# Patient Record
Sex: Female | Born: 1960 | Race: White | Hispanic: No | Marital: Married | Smoking: Former smoker
Health system: Southern US, Community
[De-identification: ages and names within clinical notes are randomized; demographics above are authoritative.]

## PROBLEM LIST (undated history)

## (undated) DIAGNOSIS — Z9889 Other specified postprocedural states: Secondary | ICD-10-CM

## (undated) DIAGNOSIS — N200 Calculus of kidney: Secondary | ICD-10-CM

## (undated) DIAGNOSIS — Z8719 Personal history of other diseases of the digestive system: Secondary | ICD-10-CM

## (undated) DIAGNOSIS — E119 Type 2 diabetes mellitus without complications: Secondary | ICD-10-CM

## (undated) DIAGNOSIS — K259 Gastric ulcer, unspecified as acute or chronic, without hemorrhage or perforation: Secondary | ICD-10-CM

## (undated) DIAGNOSIS — C801 Malignant (primary) neoplasm, unspecified: Secondary | ICD-10-CM

## (undated) DIAGNOSIS — K922 Gastrointestinal hemorrhage, unspecified: Secondary | ICD-10-CM

## (undated) DIAGNOSIS — I8500 Esophageal varices without bleeding: Secondary | ICD-10-CM

## (undated) DIAGNOSIS — K639 Disease of intestine, unspecified: Secondary | ICD-10-CM

## (undated) DIAGNOSIS — G4730 Sleep apnea, unspecified: Secondary | ICD-10-CM

## (undated) DIAGNOSIS — N184 Chronic kidney disease, stage 4 (severe): Secondary | ICD-10-CM

## (undated) DIAGNOSIS — M797 Fibromyalgia: Secondary | ICD-10-CM

## (undated) DIAGNOSIS — I219 Acute myocardial infarction, unspecified: Secondary | ICD-10-CM

## (undated) DIAGNOSIS — I81 Portal vein thrombosis: Secondary | ICD-10-CM

## (undated) DIAGNOSIS — N201 Calculus of ureter: Secondary | ICD-10-CM

## (undated) DIAGNOSIS — R51 Headache: Secondary | ICD-10-CM

## (undated) DIAGNOSIS — Z87442 Personal history of urinary calculi: Secondary | ICD-10-CM

## (undated) DIAGNOSIS — I2510 Atherosclerotic heart disease of native coronary artery without angina pectoris: Secondary | ICD-10-CM

## (undated) DIAGNOSIS — R011 Cardiac murmur, unspecified: Secondary | ICD-10-CM

## (undated) DIAGNOSIS — N289 Disorder of kidney and ureter, unspecified: Secondary | ICD-10-CM

## (undated) DIAGNOSIS — Z8619 Personal history of other infectious and parasitic diseases: Secondary | ICD-10-CM

## (undated) DIAGNOSIS — D649 Anemia, unspecified: Secondary | ICD-10-CM

## (undated) DIAGNOSIS — R7881 Bacteremia: Secondary | ICD-10-CM

## (undated) DIAGNOSIS — K7290 Hepatic failure, unspecified without coma: Secondary | ICD-10-CM

## (undated) DIAGNOSIS — IMO0002 Reserved for concepts with insufficient information to code with codable children: Secondary | ICD-10-CM

## (undated) DIAGNOSIS — F329 Major depressive disorder, single episode, unspecified: Secondary | ICD-10-CM

## (undated) DIAGNOSIS — R112 Nausea with vomiting, unspecified: Secondary | ICD-10-CM

## (undated) DIAGNOSIS — F419 Anxiety disorder, unspecified: Secondary | ICD-10-CM

## (undated) DIAGNOSIS — K7460 Unspecified cirrhosis of liver: Secondary | ICD-10-CM

## (undated) DIAGNOSIS — F32A Depression, unspecified: Secondary | ICD-10-CM

## (undated) DIAGNOSIS — T4145XA Adverse effect of unspecified anesthetic, initial encounter: Secondary | ICD-10-CM

## (undated) HISTORY — PX: COLONOSCOPY: SHX174

## (undated) HISTORY — DX: Chronic kidney disease, stage 4 (severe): N18.4

## (undated) HISTORY — PX: DILATION AND CURETTAGE OF UTERUS: SHX78

## (undated) HISTORY — DX: Type 2 diabetes mellitus without complications: E11.9

## (undated) HISTORY — DX: Personal history of other diseases of the digestive system: Z87.19

## (undated) HISTORY — PX: KNEE ARTHROSCOPY: SUR90

## (undated) HISTORY — DX: Calculus of kidney: N20.0

## (undated) HISTORY — PX: GASTRIC BYPASS: SHX52

## (undated) HISTORY — PX: LITHOTRIPSY: SUR834

## (undated) HISTORY — PX: UPPER GASTROINTESTINAL ENDOSCOPY: SHX188

## (undated) HISTORY — DX: Esophageal varices without bleeding: I85.00

## (undated) HISTORY — PX: INGUINAL HERNIA REPAIR: SUR1180

## (undated) HISTORY — DX: Unspecified cirrhosis of liver: K74.60

---

## 1985-02-07 HISTORY — PX: CHOLECYSTECTOMY: SHX55

## 2000-01-21 ENCOUNTER — Ambulatory Visit (HOSPITAL_COMMUNITY): Admission: RE | Admit: 2000-01-21 | Discharge: 2000-01-21 | Payer: Self-pay | Admitting: Obstetrics and Gynecology

## 2000-01-21 ENCOUNTER — Encounter (INDEPENDENT_AMBULATORY_CARE_PROVIDER_SITE_OTHER): Payer: Self-pay | Admitting: Specialist

## 2000-03-18 ENCOUNTER — Inpatient Hospital Stay (HOSPITAL_COMMUNITY): Admission: EM | Admit: 2000-03-18 | Discharge: 2000-03-20 | Payer: Self-pay | Admitting: Emergency Medicine

## 2000-03-18 ENCOUNTER — Encounter: Payer: Self-pay | Admitting: Cardiology

## 2000-03-19 ENCOUNTER — Encounter: Payer: Self-pay | Admitting: Cardiology

## 2000-04-03 ENCOUNTER — Other Ambulatory Visit: Admission: RE | Admit: 2000-04-03 | Discharge: 2000-04-03 | Payer: Self-pay | Admitting: Obstetrics and Gynecology

## 2000-09-16 ENCOUNTER — Emergency Department (HOSPITAL_COMMUNITY): Admission: EM | Admit: 2000-09-16 | Discharge: 2000-09-16 | Payer: Self-pay | Admitting: Emergency Medicine

## 2000-09-16 ENCOUNTER — Encounter: Payer: Self-pay | Admitting: Emergency Medicine

## 2000-09-18 ENCOUNTER — Ambulatory Visit (HOSPITAL_COMMUNITY): Admission: RE | Admit: 2000-09-18 | Discharge: 2000-09-18 | Payer: Self-pay | Admitting: Internal Medicine

## 2000-09-18 ENCOUNTER — Encounter: Payer: Self-pay | Admitting: Internal Medicine

## 2000-09-22 ENCOUNTER — Ambulatory Visit (HOSPITAL_COMMUNITY): Admission: RE | Admit: 2000-09-22 | Discharge: 2000-09-22 | Payer: Self-pay | Admitting: Family Medicine

## 2000-09-22 ENCOUNTER — Encounter: Payer: Self-pay | Admitting: Family Medicine

## 2000-09-26 ENCOUNTER — Encounter: Payer: Self-pay | Admitting: General Surgery

## 2000-09-26 ENCOUNTER — Ambulatory Visit (HOSPITAL_COMMUNITY): Admission: RE | Admit: 2000-09-26 | Discharge: 2000-09-26 | Payer: Self-pay | Admitting: General Surgery

## 2001-02-07 HISTORY — PX: ABDOMINAL HYSTERECTOMY: SHX81

## 2001-03-15 ENCOUNTER — Inpatient Hospital Stay (HOSPITAL_COMMUNITY): Admission: RE | Admit: 2001-03-15 | Discharge: 2001-03-19 | Payer: Self-pay | Admitting: Obstetrics & Gynecology

## 2001-04-16 ENCOUNTER — Other Ambulatory Visit: Admission: RE | Admit: 2001-04-16 | Discharge: 2001-04-16 | Payer: Self-pay | Admitting: Obstetrics & Gynecology

## 2002-03-12 ENCOUNTER — Encounter: Payer: Self-pay | Admitting: Emergency Medicine

## 2002-03-12 ENCOUNTER — Inpatient Hospital Stay (HOSPITAL_COMMUNITY): Admission: EM | Admit: 2002-03-12 | Discharge: 2002-03-13 | Payer: Self-pay | Admitting: *Deleted

## 2002-03-12 ENCOUNTER — Encounter (INDEPENDENT_AMBULATORY_CARE_PROVIDER_SITE_OTHER): Payer: Self-pay | Admitting: *Deleted

## 2002-08-09 ENCOUNTER — Ambulatory Visit (HOSPITAL_COMMUNITY): Admission: RE | Admit: 2002-08-09 | Discharge: 2002-08-09 | Payer: Self-pay | Admitting: General Surgery

## 2002-08-09 ENCOUNTER — Encounter: Payer: Self-pay | Admitting: General Surgery

## 2002-10-15 ENCOUNTER — Ambulatory Visit (HOSPITAL_COMMUNITY): Admission: RE | Admit: 2002-10-15 | Discharge: 2002-10-15 | Payer: Self-pay | Admitting: Internal Medicine

## 2002-10-15 ENCOUNTER — Encounter: Payer: Self-pay | Admitting: Internal Medicine

## 2002-10-18 ENCOUNTER — Ambulatory Visit (HOSPITAL_COMMUNITY): Admission: RE | Admit: 2002-10-18 | Discharge: 2002-10-18 | Payer: Self-pay | Admitting: Internal Medicine

## 2002-10-18 ENCOUNTER — Encounter: Payer: Self-pay | Admitting: Internal Medicine

## 2003-04-15 ENCOUNTER — Ambulatory Visit (HOSPITAL_COMMUNITY): Admission: RE | Admit: 2003-04-15 | Discharge: 2003-04-15 | Payer: Self-pay | Admitting: Internal Medicine

## 2003-05-15 ENCOUNTER — Ambulatory Visit (HOSPITAL_COMMUNITY): Admission: RE | Admit: 2003-05-15 | Discharge: 2003-05-15 | Payer: Self-pay | Admitting: Internal Medicine

## 2003-08-01 ENCOUNTER — Ambulatory Visit (HOSPITAL_COMMUNITY): Admission: RE | Admit: 2003-08-01 | Discharge: 2003-08-01 | Payer: Self-pay | Admitting: *Deleted

## 2003-08-18 ENCOUNTER — Ambulatory Visit (HOSPITAL_COMMUNITY): Admission: RE | Admit: 2003-08-18 | Discharge: 2003-08-18 | Payer: Self-pay | Admitting: Urology

## 2003-08-28 ENCOUNTER — Ambulatory Visit (HOSPITAL_COMMUNITY): Admission: RE | Admit: 2003-08-28 | Discharge: 2003-08-28 | Payer: Self-pay | Admitting: Internal Medicine

## 2003-09-20 ENCOUNTER — Ambulatory Visit (HOSPITAL_COMMUNITY): Admission: RE | Admit: 2003-09-20 | Discharge: 2003-09-20 | Payer: Self-pay | Admitting: Internal Medicine

## 2003-09-20 ENCOUNTER — Encounter (HOSPITAL_COMMUNITY): Admission: RE | Admit: 2003-09-20 | Discharge: 2003-10-20 | Payer: Self-pay | Admitting: Internal Medicine

## 2003-09-20 ENCOUNTER — Encounter (INDEPENDENT_AMBULATORY_CARE_PROVIDER_SITE_OTHER): Payer: Self-pay | Admitting: Specialist

## 2003-11-21 ENCOUNTER — Ambulatory Visit (HOSPITAL_COMMUNITY): Admission: RE | Admit: 2003-11-21 | Discharge: 2003-11-21 | Payer: Self-pay | Admitting: Internal Medicine

## 2003-12-09 ENCOUNTER — Inpatient Hospital Stay (HOSPITAL_COMMUNITY): Admission: EM | Admit: 2003-12-09 | Discharge: 2003-12-13 | Payer: Self-pay | Admitting: Emergency Medicine

## 2003-12-25 ENCOUNTER — Ambulatory Visit: Payer: Self-pay | Admitting: Gastroenterology

## 2003-12-30 ENCOUNTER — Encounter (HOSPITAL_COMMUNITY): Admission: RE | Admit: 2003-12-30 | Discharge: 2004-01-29 | Payer: Self-pay | Admitting: Internal Medicine

## 2003-12-30 ENCOUNTER — Ambulatory Visit (HOSPITAL_COMMUNITY): Payer: Self-pay | Admitting: Internal Medicine

## 2004-01-02 ENCOUNTER — Ambulatory Visit (HOSPITAL_COMMUNITY): Payer: Self-pay | Admitting: Oncology

## 2004-01-05 ENCOUNTER — Ambulatory Visit (HOSPITAL_COMMUNITY): Admission: RE | Admit: 2004-01-05 | Discharge: 2004-01-05 | Payer: Self-pay | Admitting: Internal Medicine

## 2004-01-29 ENCOUNTER — Ambulatory Visit: Payer: Self-pay | Admitting: Internal Medicine

## 2004-02-23 ENCOUNTER — Ambulatory Visit (HOSPITAL_COMMUNITY): Payer: Self-pay | Admitting: Internal Medicine

## 2004-02-23 ENCOUNTER — Encounter (HOSPITAL_COMMUNITY): Admission: RE | Admit: 2004-02-23 | Discharge: 2004-04-05 | Payer: Self-pay | Admitting: Internal Medicine

## 2004-03-01 ENCOUNTER — Ambulatory Visit: Payer: Self-pay | Admitting: Internal Medicine

## 2004-03-29 ENCOUNTER — Ambulatory Visit: Payer: Self-pay | Admitting: Internal Medicine

## 2004-04-07 ENCOUNTER — Encounter (HOSPITAL_COMMUNITY): Admission: RE | Admit: 2004-04-07 | Discharge: 2004-05-07 | Payer: Self-pay | Admitting: Internal Medicine

## 2004-04-26 ENCOUNTER — Ambulatory Visit: Payer: Self-pay | Admitting: Internal Medicine

## 2004-05-25 ENCOUNTER — Ambulatory Visit: Payer: Self-pay | Admitting: Internal Medicine

## 2004-06-07 ENCOUNTER — Encounter (HOSPITAL_COMMUNITY): Admission: RE | Admit: 2004-06-07 | Discharge: 2004-07-07 | Payer: Self-pay | Admitting: Internal Medicine

## 2004-06-07 ENCOUNTER — Ambulatory Visit: Payer: Self-pay | Admitting: Internal Medicine

## 2004-06-14 ENCOUNTER — Ambulatory Visit (HOSPITAL_COMMUNITY): Payer: Self-pay | Admitting: Internal Medicine

## 2004-07-02 ENCOUNTER — Emergency Department (HOSPITAL_COMMUNITY): Admission: EM | Admit: 2004-07-02 | Discharge: 2004-07-02 | Payer: Self-pay | Admitting: Emergency Medicine

## 2004-07-06 ENCOUNTER — Ambulatory Visit: Payer: Self-pay | Admitting: Internal Medicine

## 2004-07-07 ENCOUNTER — Ambulatory Visit (HOSPITAL_COMMUNITY): Admission: RE | Admit: 2004-07-07 | Discharge: 2004-07-07 | Payer: Self-pay | Admitting: Internal Medicine

## 2004-07-08 ENCOUNTER — Encounter (HOSPITAL_COMMUNITY): Admission: RE | Admit: 2004-07-08 | Discharge: 2004-08-11 | Payer: Self-pay | Admitting: Internal Medicine

## 2004-07-08 ENCOUNTER — Ambulatory Visit (HOSPITAL_COMMUNITY): Admission: RE | Admit: 2004-07-08 | Discharge: 2004-07-08 | Payer: Self-pay | Admitting: Internal Medicine

## 2004-07-14 ENCOUNTER — Ambulatory Visit: Payer: Self-pay | Admitting: Internal Medicine

## 2004-07-19 ENCOUNTER — Ambulatory Visit: Payer: Self-pay | Admitting: Internal Medicine

## 2004-07-28 ENCOUNTER — Ambulatory Visit: Payer: Self-pay | Admitting: Internal Medicine

## 2004-08-02 ENCOUNTER — Ambulatory Visit (HOSPITAL_COMMUNITY): Payer: Self-pay | Admitting: Internal Medicine

## 2004-08-11 ENCOUNTER — Ambulatory Visit: Payer: Self-pay | Admitting: Internal Medicine

## 2004-08-16 ENCOUNTER — Encounter (HOSPITAL_COMMUNITY): Admission: RE | Admit: 2004-08-16 | Discharge: 2004-09-15 | Payer: Self-pay | Admitting: Internal Medicine

## 2004-09-13 ENCOUNTER — Ambulatory Visit: Payer: Self-pay | Admitting: Internal Medicine

## 2004-09-20 ENCOUNTER — Ambulatory Visit (HOSPITAL_COMMUNITY): Payer: Self-pay | Admitting: Internal Medicine

## 2004-09-20 ENCOUNTER — Encounter (HOSPITAL_COMMUNITY): Admission: RE | Admit: 2004-09-20 | Discharge: 2004-10-20 | Payer: Self-pay | Admitting: Internal Medicine

## 2004-10-12 ENCOUNTER — Ambulatory Visit: Payer: Self-pay | Admitting: Internal Medicine

## 2004-10-25 ENCOUNTER — Encounter (HOSPITAL_COMMUNITY): Admission: RE | Admit: 2004-10-25 | Discharge: 2004-11-05 | Payer: Self-pay | Admitting: Oncology

## 2004-11-08 ENCOUNTER — Encounter (HOSPITAL_COMMUNITY): Admission: RE | Admit: 2004-11-08 | Discharge: 2004-12-08 | Payer: Self-pay | Admitting: Oncology

## 2004-11-08 ENCOUNTER — Ambulatory Visit: Payer: Self-pay | Admitting: Internal Medicine

## 2004-11-08 ENCOUNTER — Ambulatory Visit (HOSPITAL_COMMUNITY): Payer: Self-pay | Admitting: Internal Medicine

## 2004-12-09 ENCOUNTER — Ambulatory Visit: Payer: Self-pay | Admitting: Internal Medicine

## 2004-12-13 ENCOUNTER — Encounter (HOSPITAL_COMMUNITY): Admission: RE | Admit: 2004-12-13 | Discharge: 2005-01-12 | Payer: Self-pay | Admitting: Internal Medicine

## 2004-12-29 ENCOUNTER — Ambulatory Visit (HOSPITAL_COMMUNITY): Admission: RE | Admit: 2004-12-29 | Discharge: 2004-12-29 | Payer: Self-pay | Admitting: Internal Medicine

## 2005-01-03 ENCOUNTER — Ambulatory Visit (HOSPITAL_COMMUNITY): Payer: Self-pay | Admitting: Internal Medicine

## 2005-01-04 ENCOUNTER — Ambulatory Visit: Payer: Self-pay | Admitting: Internal Medicine

## 2005-01-17 ENCOUNTER — Encounter (HOSPITAL_COMMUNITY): Admission: RE | Admit: 2005-01-17 | Discharge: 2005-02-02 | Payer: Self-pay | Admitting: Oncology

## 2005-02-09 ENCOUNTER — Encounter (HOSPITAL_COMMUNITY): Admission: RE | Admit: 2005-02-09 | Discharge: 2005-03-11 | Payer: Self-pay | Admitting: Internal Medicine

## 2005-02-22 ENCOUNTER — Emergency Department (HOSPITAL_COMMUNITY): Admission: EM | Admit: 2005-02-22 | Discharge: 2005-02-22 | Payer: Self-pay | Admitting: Emergency Medicine

## 2005-02-24 ENCOUNTER — Ambulatory Visit (HOSPITAL_COMMUNITY): Admission: RE | Admit: 2005-02-24 | Discharge: 2005-02-24 | Payer: Self-pay | Admitting: Internal Medicine

## 2005-02-24 ENCOUNTER — Ambulatory Visit: Payer: Self-pay | Admitting: *Deleted

## 2005-02-24 ENCOUNTER — Ambulatory Visit: Payer: Self-pay | Admitting: Internal Medicine

## 2005-03-10 ENCOUNTER — Ambulatory Visit: Payer: Self-pay | Admitting: Internal Medicine

## 2005-03-24 ENCOUNTER — Ambulatory Visit: Payer: Self-pay | Admitting: Internal Medicine

## 2005-04-26 ENCOUNTER — Ambulatory Visit: Payer: Self-pay | Admitting: Internal Medicine

## 2005-04-30 ENCOUNTER — Emergency Department (HOSPITAL_COMMUNITY): Admission: EM | Admit: 2005-04-30 | Discharge: 2005-04-30 | Payer: Self-pay | Admitting: Emergency Medicine

## 2005-04-30 ENCOUNTER — Ambulatory Visit: Payer: Self-pay | Admitting: Internal Medicine

## 2005-05-04 ENCOUNTER — Ambulatory Visit (HOSPITAL_COMMUNITY): Payer: Self-pay | Admitting: Internal Medicine

## 2005-05-04 ENCOUNTER — Encounter: Admission: RE | Admit: 2005-05-04 | Discharge: 2005-05-04 | Payer: Self-pay | Admitting: Oncology

## 2005-05-04 ENCOUNTER — Encounter (HOSPITAL_COMMUNITY): Admission: RE | Admit: 2005-05-04 | Discharge: 2005-06-03 | Payer: Self-pay | Admitting: Oncology

## 2005-06-21 ENCOUNTER — Ambulatory Visit: Payer: Self-pay | Admitting: Internal Medicine

## 2005-07-25 ENCOUNTER — Ambulatory Visit (HOSPITAL_COMMUNITY): Admission: RE | Admit: 2005-07-25 | Discharge: 2005-07-25 | Payer: Self-pay | Admitting: Urology

## 2005-08-31 ENCOUNTER — Ambulatory Visit: Payer: Self-pay | Admitting: Internal Medicine

## 2005-09-12 ENCOUNTER — Emergency Department (HOSPITAL_COMMUNITY): Admission: EM | Admit: 2005-09-12 | Discharge: 2005-09-12 | Payer: Self-pay | Admitting: Emergency Medicine

## 2005-09-22 ENCOUNTER — Ambulatory Visit: Payer: Self-pay | Admitting: Internal Medicine

## 2005-09-30 ENCOUNTER — Ambulatory Visit: Payer: Self-pay | Admitting: Internal Medicine

## 2005-10-28 ENCOUNTER — Ambulatory Visit (HOSPITAL_COMMUNITY): Admission: RE | Admit: 2005-10-28 | Discharge: 2005-10-28 | Payer: Self-pay | Admitting: Internal Medicine

## 2005-10-28 ENCOUNTER — Ambulatory Visit: Payer: Self-pay | Admitting: Internal Medicine

## 2005-12-06 ENCOUNTER — Ambulatory Visit (HOSPITAL_COMMUNITY): Admission: RE | Admit: 2005-12-06 | Discharge: 2005-12-06 | Payer: Self-pay | Admitting: Family Medicine

## 2005-12-22 ENCOUNTER — Ambulatory Visit: Payer: Self-pay | Admitting: Internal Medicine

## 2006-02-22 ENCOUNTER — Ambulatory Visit: Payer: Self-pay | Admitting: Internal Medicine

## 2006-03-01 ENCOUNTER — Ambulatory Visit: Payer: Self-pay | Admitting: Cardiology

## 2006-03-03 ENCOUNTER — Ambulatory Visit: Payer: Self-pay | Admitting: Internal Medicine

## 2006-03-08 ENCOUNTER — Ambulatory Visit: Payer: Self-pay | Admitting: Cardiology

## 2006-03-08 ENCOUNTER — Encounter (HOSPITAL_COMMUNITY): Admission: RE | Admit: 2006-03-08 | Discharge: 2006-04-07 | Payer: Self-pay | Admitting: Cardiology

## 2006-04-04 ENCOUNTER — Encounter (INDEPENDENT_AMBULATORY_CARE_PROVIDER_SITE_OTHER): Payer: Self-pay | Admitting: *Deleted

## 2006-04-04 ENCOUNTER — Ambulatory Visit (HOSPITAL_COMMUNITY): Admission: RE | Admit: 2006-04-04 | Discharge: 2006-04-04 | Payer: Self-pay | Admitting: Internal Medicine

## 2006-04-04 ENCOUNTER — Ambulatory Visit: Payer: Self-pay | Admitting: Internal Medicine

## 2006-05-12 ENCOUNTER — Ambulatory Visit: Payer: Self-pay | Admitting: Internal Medicine

## 2006-06-30 ENCOUNTER — Ambulatory Visit: Payer: Self-pay | Admitting: Internal Medicine

## 2006-06-30 ENCOUNTER — Ambulatory Visit (HOSPITAL_COMMUNITY): Admission: RE | Admit: 2006-06-30 | Discharge: 2006-06-30 | Payer: Self-pay | Admitting: Internal Medicine

## 2006-10-28 ENCOUNTER — Emergency Department (HOSPITAL_COMMUNITY): Admission: EM | Admit: 2006-10-28 | Discharge: 2006-10-29 | Payer: Self-pay | Admitting: Emergency Medicine

## 2006-12-10 ENCOUNTER — Emergency Department (HOSPITAL_COMMUNITY): Admission: EM | Admit: 2006-12-10 | Discharge: 2006-12-10 | Payer: Self-pay | Admitting: Emergency Medicine

## 2006-12-18 ENCOUNTER — Ambulatory Visit (HOSPITAL_COMMUNITY): Admission: RE | Admit: 2006-12-18 | Discharge: 2006-12-18 | Payer: Self-pay | Admitting: Urology

## 2007-02-23 ENCOUNTER — Ambulatory Visit (HOSPITAL_COMMUNITY): Admission: RE | Admit: 2007-02-23 | Discharge: 2007-02-23 | Payer: Self-pay | Admitting: Internal Medicine

## 2007-04-25 ENCOUNTER — Ambulatory Visit (HOSPITAL_COMMUNITY): Admission: RE | Admit: 2007-04-25 | Discharge: 2007-04-25 | Payer: Self-pay | Admitting: Urology

## 2007-04-26 ENCOUNTER — Ambulatory Visit (HOSPITAL_COMMUNITY): Admission: RE | Admit: 2007-04-26 | Discharge: 2007-04-26 | Payer: Self-pay | Admitting: Urology

## 2007-04-26 ENCOUNTER — Encounter (INDEPENDENT_AMBULATORY_CARE_PROVIDER_SITE_OTHER): Payer: Self-pay | Admitting: Urology

## 2007-05-03 ENCOUNTER — Ambulatory Visit (HOSPITAL_COMMUNITY): Admission: RE | Admit: 2007-05-03 | Discharge: 2007-05-03 | Payer: Self-pay | Admitting: Urology

## 2007-06-01 ENCOUNTER — Ambulatory Visit (HOSPITAL_COMMUNITY): Admission: RE | Admit: 2007-06-01 | Discharge: 2007-06-01 | Payer: Self-pay | Admitting: Urology

## 2008-03-12 ENCOUNTER — Ambulatory Visit (HOSPITAL_COMMUNITY): Admission: RE | Admit: 2008-03-12 | Discharge: 2008-03-12 | Payer: Self-pay | Admitting: Internal Medicine

## 2008-07-02 ENCOUNTER — Ambulatory Visit (HOSPITAL_COMMUNITY): Admission: RE | Admit: 2008-07-02 | Discharge: 2008-07-02 | Payer: Self-pay | Admitting: Family Medicine

## 2008-07-03 ENCOUNTER — Ambulatory Visit (HOSPITAL_COMMUNITY): Admission: RE | Admit: 2008-07-03 | Discharge: 2008-07-03 | Payer: Self-pay | Admitting: Family Medicine

## 2008-10-14 ENCOUNTER — Ambulatory Visit (HOSPITAL_COMMUNITY): Admission: RE | Admit: 2008-10-14 | Discharge: 2008-10-14 | Payer: Self-pay | Admitting: Internal Medicine

## 2008-10-28 ENCOUNTER — Encounter: Admission: RE | Admit: 2008-10-28 | Discharge: 2008-10-28 | Payer: Self-pay | Admitting: Internal Medicine

## 2009-05-06 ENCOUNTER — Encounter: Admission: RE | Admit: 2009-05-06 | Discharge: 2009-05-06 | Payer: Self-pay | Admitting: Internal Medicine

## 2009-07-08 ENCOUNTER — Ambulatory Visit (HOSPITAL_COMMUNITY): Admission: RE | Admit: 2009-07-08 | Discharge: 2009-07-08 | Payer: Self-pay | Admitting: Internal Medicine

## 2009-12-11 ENCOUNTER — Encounter: Admission: RE | Admit: 2009-12-11 | Discharge: 2009-12-11 | Payer: Self-pay | Admitting: Internal Medicine

## 2009-12-19 ENCOUNTER — Inpatient Hospital Stay (HOSPITAL_COMMUNITY): Admission: EM | Admit: 2009-12-19 | Discharge: 2009-12-24 | Payer: Self-pay | Admitting: Emergency Medicine

## 2010-02-28 ENCOUNTER — Encounter (INDEPENDENT_AMBULATORY_CARE_PROVIDER_SITE_OTHER): Payer: Self-pay | Admitting: Internal Medicine

## 2010-02-28 ENCOUNTER — Encounter: Payer: Self-pay | Admitting: Urology

## 2010-03-01 ENCOUNTER — Encounter (INDEPENDENT_AMBULATORY_CARE_PROVIDER_SITE_OTHER): Payer: Self-pay | Admitting: Internal Medicine

## 2010-04-20 LAB — HEMOGLOBIN A1C
Mean Plasma Glucose: 134 mg/dL — ABNORMAL HIGH (ref <117)
Mean Plasma Glucose: 134 mg/dL — ABNORMAL HIGH (ref <117)

## 2010-04-20 LAB — CBC
HCT: 28.5 % — ABNORMAL LOW (ref 36.0–46.0)
HCT: 34.5 % — ABNORMAL LOW (ref 36.0–46.0)
Hemoglobin: 10.2 g/dL — ABNORMAL LOW (ref 12.0–15.0)
Hemoglobin: 11.6 g/dL — ABNORMAL LOW (ref 12.0–15.0)
Hemoglobin: 9.9 g/dL — ABNORMAL LOW (ref 12.0–15.0)
MCH: 29.6 pg (ref 26.0–34.0)
MCH: 29.7 pg (ref 26.0–34.0)
MCH: 30 pg (ref 26.0–34.0)
MCH: 30.1 pg (ref 26.0–34.0)
MCHC: 33.7 g/dL (ref 30.0–36.0)
MCHC: 33.8 g/dL (ref 30.0–36.0)
MCHC: 34.4 g/dL (ref 30.0–36.0)
MCHC: 34.4 g/dL (ref 30.0–36.0)
MCHC: 34.6 g/dL (ref 30.0–36.0)
MCV: 87.7 fL (ref 78.0–100.0)
Platelets: 38 10*3/uL — ABNORMAL LOW (ref 150–400)
Platelets: 39 10*3/uL — ABNORMAL LOW (ref 150–400)
Platelets: 47 10*3/uL — ABNORMAL LOW (ref 150–400)
RBC: 3.3 MIL/uL — ABNORMAL LOW (ref 3.87–5.11)
RBC: 3.45 MIL/uL — ABNORMAL LOW (ref 3.87–5.11)
RDW: 16 % — ABNORMAL HIGH (ref 11.5–15.5)
RDW: 16.1 % — ABNORMAL HIGH (ref 11.5–15.5)
RDW: 16.2 % — ABNORMAL HIGH (ref 11.5–15.5)
RDW: 16.3 % — ABNORMAL HIGH (ref 11.5–15.5)
WBC: 10.2 10*3/uL (ref 4.0–10.5)

## 2010-04-20 LAB — COMPREHENSIVE METABOLIC PANEL
ALT: 35 U/L (ref 0–35)
ALT: 63 U/L — ABNORMAL HIGH (ref 0–35)
ALT: 70 U/L — ABNORMAL HIGH (ref 0–35)
AST: 35 U/L (ref 0–37)
Albumin: 2.5 g/dL — ABNORMAL LOW (ref 3.5–5.2)
Albumin: 2.6 g/dL — ABNORMAL LOW (ref 3.5–5.2)
BUN: 20 mg/dL (ref 6–23)
BUN: 25 mg/dL — ABNORMAL HIGH (ref 6–23)
CO2: 23 mEq/L (ref 19–32)
CO2: 24 mEq/L (ref 19–32)
Calcium: 7.6 mg/dL — ABNORMAL LOW (ref 8.4–10.5)
Calcium: 8 mg/dL — ABNORMAL LOW (ref 8.4–10.5)
Calcium: 8.3 mg/dL — ABNORMAL LOW (ref 8.4–10.5)
Calcium: 8.3 mg/dL — ABNORMAL LOW (ref 8.4–10.5)
Chloride: 103 mEq/L (ref 96–112)
Creatinine, Ser: 0.88 mg/dL (ref 0.4–1.2)
Creatinine, Ser: 1.15 mg/dL (ref 0.4–1.2)
Creatinine, Ser: 1.66 mg/dL — ABNORMAL HIGH (ref 0.4–1.2)
Creatinine, Ser: 2.04 mg/dL — ABNORMAL HIGH (ref 0.4–1.2)
GFR calc Af Amer: >60 mL/min (ref >60)
GFR calc non Af Amer: 33 mL/min — ABNORMAL LOW (ref >60)
GFR calc non Af Amer: 50 mL/min — ABNORMAL LOW (ref >60)
Glucose, Bld: 110 mg/dL — ABNORMAL HIGH (ref 70–99)
Glucose, Bld: 250 mg/dL — ABNORMAL HIGH (ref 70–99)
Potassium: 3.8 mEq/L (ref 3.5–5.1)
Sodium: 129 mEq/L — ABNORMAL LOW (ref 135–145)
Sodium: 131 mEq/L — ABNORMAL LOW (ref 135–145)
Sodium: 138 mEq/L (ref 135–145)
Total Protein: 5.5 g/dL — ABNORMAL LOW (ref 6.0–8.3)
Total Protein: 5.8 g/dL — ABNORMAL LOW (ref 6.0–8.3)
Total Protein: 6.7 g/dL (ref 6.0–8.3)

## 2010-04-20 LAB — GLUCOSE, CAPILLARY
Glucose-Capillary: 131 mg/dL — ABNORMAL HIGH (ref 70–99)
Glucose-Capillary: 169 mg/dL — ABNORMAL HIGH (ref 70–99)
Glucose-Capillary: 170 mg/dL — ABNORMAL HIGH (ref 70–99)
Glucose-Capillary: 215 mg/dL — ABNORMAL HIGH (ref 70–99)
Glucose-Capillary: 251 mg/dL — ABNORMAL HIGH (ref 70–99)
Glucose-Capillary: 263 mg/dL — ABNORMAL HIGH (ref 70–99)
Glucose-Capillary: 308 mg/dL — ABNORMAL HIGH (ref 70–99)
Glucose-Capillary: 361 mg/dL — ABNORMAL HIGH (ref 70–99)

## 2010-04-20 LAB — URINE MICROSCOPIC-ADD ON

## 2010-04-20 LAB — BASIC METABOLIC PANEL
CO2: 25 mEq/L (ref 19–32)
Calcium: 7.8 mg/dL — ABNORMAL LOW (ref 8.4–10.5)
GFR calc Af Amer: >60 mL/min (ref >60)
Sodium: 135 mEq/L (ref 135–145)

## 2010-04-20 LAB — CULTURE, BLOOD (ROUTINE X 2)
Culture: NO GROWTH
Culture: NO GROWTH

## 2010-04-20 LAB — URINALYSIS, ROUTINE W REFLEX MICROSCOPIC
Bilirubin Urine: NEGATIVE
Nitrite: NEGATIVE
Nitrite: NEGATIVE
Specific Gravity, Urine: 1.02 (ref 1.005–1.030)
Specific Gravity, Urine: 1.02 (ref 1.005–1.030)
Urobilinogen, UA: 0.2 mg/dL (ref 0.0–1.0)
pH: 5.5 (ref 5.0–8.0)

## 2010-04-20 LAB — DIFFERENTIAL
Basophils Absolute: 0 10*3/uL (ref 0.0–0.1)
Basophils Relative: 0 % (ref 0–1)
Basophils Relative: 1 % (ref 0–1)
Eosinophils Absolute: 0 10*3/uL (ref 0.0–0.7)
Eosinophils Absolute: 0.1 10*3/uL (ref 0.0–0.7)
Eosinophils Absolute: 0.1 10*3/uL (ref 0.0–0.7)
Eosinophils Absolute: 0.1 10*3/uL (ref 0.0–0.7)
Eosinophils Relative: 2 % (ref 0–5)
Lymphocytes Relative: 13 % (ref 12–46)
Lymphocytes Relative: 3 % — ABNORMAL LOW (ref 12–46)
Lymphs Abs: 0.3 10*3/uL — ABNORMAL LOW (ref 0.7–4.0)
Lymphs Abs: 0.3 10*3/uL — ABNORMAL LOW (ref 0.7–4.0)
Lymphs Abs: 0.5 10*3/uL — ABNORMAL LOW (ref 0.7–4.0)
Lymphs Abs: 0.5 10*3/uL — ABNORMAL LOW (ref 0.7–4.0)
Monocytes Absolute: 0.4 10*3/uL (ref 0.1–1.0)
Monocytes Absolute: 0.7 10*3/uL (ref 0.1–1.0)
Monocytes Absolute: 0.7 10*3/uL (ref 0.1–1.0)
Monocytes Relative: 11 % (ref 3–12)
Monocytes Relative: 15 % — ABNORMAL HIGH (ref 3–12)
Monocytes Relative: 7 % (ref 3–12)
Monocytes Relative: 9 % (ref 3–12)
Neutro Abs: 10.5 10*3/uL — ABNORMAL HIGH (ref 1.7–7.7)
Neutro Abs: 2.8 10*3/uL (ref 1.7–7.7)
Neutro Abs: 2.9 10*3/uL (ref 1.7–7.7)
Neutro Abs: 4.3 10*3/uL (ref 1.7–7.7)
Neutro Abs: 9.2 10*3/uL — ABNORMAL HIGH (ref 1.7–7.7)
Neutrophils Relative %: 69 % (ref 43–77)
Neutrophils Relative %: 73 % (ref 43–77)
Neutrophils Relative %: 83 % — ABNORMAL HIGH (ref 43–77)
Neutrophils Relative %: 90 % — ABNORMAL HIGH (ref 43–77)

## 2010-04-20 LAB — URINE CULTURE: Colony Count: >=100000

## 2010-04-20 LAB — AMMONIA: Ammonia: 32 umol/L (ref 11–35)

## 2010-04-26 ENCOUNTER — Ambulatory Visit (INDEPENDENT_AMBULATORY_CARE_PROVIDER_SITE_OTHER): Payer: BC Managed Care – PPO | Admitting: Internal Medicine

## 2010-04-26 DIAGNOSIS — R188 Other ascites: Secondary | ICD-10-CM

## 2010-04-26 DIAGNOSIS — K7460 Unspecified cirrhosis of liver: Secondary | ICD-10-CM

## 2010-06-22 NOTE — Op Note (Signed)
NAMEMARCILLA, [name]                  ACCOUNT NO.:  [id number]   MEDICAL RECORD NO.:  [id number]          PATIENT TYPE:  AMB   LOCATION:  DAY                           FACILITY:  [facility]   PHYSICIAN:  Miguel Dibble, M.D.   DATE OF BIRTH:  1960/05/22   DATE OF PROCEDURE:  05/03/2007  DATE OF DISCHARGE:                               OPERATIVE REPORT   PREOPERATIVE DIAGNOSES:  1. Right upper ureteral calculus with obstruction.  2. Right hydronephrosis.  3. Right renal colic.   POSTOPERATIVE DIAGNOSES:  1. Right upper ureteral calculus with obstruction.  2. Right hydronephrosis.  3. Right renal colic.   OPERATIVE PROCEDURE:  Cystoscopy, removal of right ureteral stent, right  retrograde pyelogram, right ureteroscopy, holmium laser lithotripsy of  right ureteral calculus, attempted basket extraction of stone fragments,  right ureteral stent placement.   ANESTHESIA:  General.   SURGEON:  Miguel Dibble, M.D.   COMPLICATIONS:  None.   ESTIMATED BLOOD LOSS:  Minimal.   SPECIMEN:  None.   DRAINS:  A 6 French 26 cm size right ureteral stent with a string.   INDICATIONS FOR PROCEDURE:  This 50 year old female has history of  recurrent urolithiasis.  She was evaluated for right flank pain.  She  had a 8 mm size right upper ureteral calculus with obstruction and  hydronephrosis.  She has undergone cystoscopy, retrograde pyelogram,  ureteroscopic extraction of a small right distal ureteral calculus as  well as stent placement about a week ago.  She was taken back to the OR  today for cystoscopy, right retrograde pyelogram, ureteroscopic stone  extraction and stent placement.   DESCRIPTION OF PROCEDURE:  General anesthesia was induced and the  patient was placed on the OR table in the dorsal lithotomy position.  The lower abdomen and genitalia were prepped and draped in a sterile  fashion.  Cystoscopy was done with a 23-French scope.  Appearance of the  bladder was normal.  Lower end of  the right ureteral stent was grasped  with a grasping forceps and the lower end of the stent was brought  through the urethra.  A 0.038 inch Bentson guidewire was then inserted  through the stent into the right collecting system.  The stent was  removed.  An 8.5 French rigid ureteroscope was passed along the side of  the guidewire into the distal ureter.  Stone was noted about the level  of the pelvic brim.  The stone measured about 8 mm in size.  The stone  was spiculated.   Laser fiber was then inserted through the ureteroscope.  The stone was  fragmented using the laser energy.  The laser settings, holmium laser  energy 0.8 joules, rate 10 pulses, power 8 watts, total energy 0.32 kJ.  The stone was fragmented into five or six pieces.  I tried to extract  the large fragment with 0 tip nitinol wire basket.  Due to the ureteral  fibrosis, I could not extract the stone fragment.  The basket was then  removed.  Laser fiber was reinserted and the stone was fragmented into  several pieces.  I felt the stone was broken into smaller pieces to be  passed at a later date.  The instruments were removed.  A 6 French 26 cm  size stent with a string was inserted into the right collecting system.  The patient was transferred to the PACU in a satisfactory condition.   ESTIMATED BLOOD LOSS:  Minimal.      Miguel Dibble, M.D.  Electronically Signed     SK/MEDQ  D:  05/03/2007  T:  05/03/2007  Job:  [id number]   cc:   Sherrilee Gilles. Gerarda Fraction, MD  [phone]   Hildred Laser, M.D.  Fax: [phone]

## 2010-06-22 NOTE — Op Note (Signed)
NAME:  [name], [name]                  ACCOUNT NO.:  [id number]   MEDICAL RECORD NO.:  [id number]          PATIENT TYPE:  AMB   LOCATION:  DAY                          FACILITY:  [facility]   PHYSICIAN:  Lucina Mellow. Terance Hart, M.D.DATE OF BIRTH:  04-26-1960   DATE OF PROCEDURE:  12/18/2006  DATE OF DISCHARGE:                               OPERATIVE REPORT   PREOPERATIVE DIAGNOSES:  1. Gross hematuria.  2. Suspected right ureteral stones sight renal stones.   POSTOPERATIVE DIAGNOSES:  1. Hematuria.  2. Right ureteral stones.   PROCEDURES:  1. Cystoscopy.  2. Bilateral retrograde pyelograms with interpretation.  3. Right ureteroscopy and stone basketing.  4. Right double-J catheter placement.   SURGEON:  Lucina Mellow. Terance Hart, M.D.   ANESTHESIA:  General.   INDICATIONS:  This lady with a past history of kidney stone disease has  had recent problems with gross hematuria, some right flank pain, and a  CT scan showed right renal stones and there was some question of distal  right ureteral stones but the radiologist wondered whether it could be  just vascular and rather than undergo ESL, it was felt that ureteroscopy  should be done for a hematuria workup and if the calcification was not  stone, then obviously a voiding an unneeded ESL.   PROCEDURE:  The patient was brought to the operating room and placed in  the lithotomy position, external genitalia prepped and draped in the  usual fashion.  She was cystoscoped and the bladder was totally normal.   Using a cone-tip catheter inserted cystoscopically, a left retrograde  pyelogram was obtained which showed no stones, obstruction or filling  defects in the left pyelocaliceal system and ureter.  Later on in the  case through an open-ended ureteral catheter, right retrograde pyelogram  was obtained that demonstrated stones in the lower pole of the right  kidney plus hydronephrosis.   After performing the left retrograde pyelogram, I inserted a  cone-tip  catheter in the right UO and fluoroscopically saw some stones remove up  in the ureter.  Therefore, I then put in flexible retraction, a  retractable-core guidewire that went up past the stones under  fluoroscopic vision and then inserted the 6-French ureteroscope and  found multiple stones in the distal right ureter.  They were all removed  with the nitinol stone basket with the exception of one stone that was a  millimeter or two in size that migrated back up in the kidney.  After  removing all the other stones, I reinserted the ureteroscope all the way  up to just about to the UPJ and did not see the stone and passed the  nitinol stone basket but did not retrieve any more stones.  At this  point the previously-mentioned retrograde pyelogram with the open-ended  ureteral catheter was performed and measuring, and she had a quite  lengthy ureter  for her size.  Therefore, a 6-French 28 cm double-J catheter was  inserted with a full coil in the renal pelvis and a full coil in the  bladder and string brought out per  urethra and taped to the thigh.  The  bladder was emptied, the cystoscope removed.  The patient sent to the  recovery room in good condition, having tolerated the procedure well.      Lucina Mellow. Terance Hart, M.D.  Electronically Signed     LJP/MEDQ  D:  12/18/2006  T:  12/18/2006  Job:  [id number]

## 2010-06-22 NOTE — Op Note (Signed)
[name], [name]                  ACCOUNT NO.:  [id number]   MEDICAL RECORD NO.:  [id number]          PATIENT TYPE:  AMB   LOCATION:  DAY                           FACILITY:  [facility]   PHYSICIAN:  Miguel Dibble, M.D.   DATE OF BIRTH:  11-26-1960   DATE OF PROCEDURE:  04/26/2007  DATE OF DISCHARGE:                               OPERATIVE REPORT   PREOPERATIVE DIAGNOSIS:  Right ureteral calculi with obstruction, right  hydronephrosis, right renal colic.   POSTOPERATIVE DIAGNOSIS:  Right ureteral calculi with obstruction, right  hydronephrosis, right renal colic.   OPERATIVE PROCEDURE:  Cystoscopy, basket extraction of right distal  ureteral calculus, right retrograde pyelogram, right ureteral stent  placement.   ANESTHESIA:  General.   SURGEON:  Miguel Dibble, M.D.   COMPLICATIONS:  None.   DRAINS:  6-French 26-cm size left right ureteral stent.   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  She also has history of recurrent  urolithiasis.  She was evaluated for severe right flank pain.  There was  an 8-mm upper ureteral calculus with obstruction and hydronephrosis.  The patient was taken to the operating room today for cystoscopy, right  retrograde pyelogram, and right ureteral stent placement.  The stone  will later be treated with ESL as an outpatient.   DESCRIPTION OF PROCEDURE:  General anesthesia was induced, and the  patient was placed on the OR table in the dorsal lithotomy position.  The lower abdomen and genitalia were prepped and draped in a sterile  fashion.  Cystoscopy was done with a 20-French scope.  There was 6-mm  size stone protruding through the right ureteral orifice.  The stone was  extracted with a nitinol wire basket.  Right retrograde pyelogram was  done by using a 5-French wedge catheter.  The distal ureter was mildly  dilated.  There was a filling defect about 8 mm in size just above  the  pelvic brim.  There was proximal hydroureter and hydronephrosis.   A 5-French open-ended catheter was then placed in the right distal  ureter.  A 0.038 inch Bentson guide with a flexible tip was then  advanced in the right renal pelvis.  A 6-French 26-cm size stent was  then placed over the  guidewire and pushed into the right collecting system.  The instruments  were removed.  The patient was transferred to the PACU in a satisfactory  condition.   The obstructing right upper ureteral calculus will be treated with the  ESL as an outpatient at a later date.      Miguel Dibble, M.D.  Electronically Signed     SK/MEDQ  D:  04/26/2007  T:  04/26/2007  Job:  [id number]   cc:   Hildred Laser, M.D.  Fax: [place] Gerarda Fraction, MD  Fax: [phone]

## 2010-06-22 NOTE — H&P (Signed)
[name], [name]                  ACCOUNT NO.:  [id number]   MEDICAL RECORD NO.:  [id number]          PATIENT TYPE:  AMB   LOCATION:  DAY                           FACILITY:  [facility]   PHYSICIAN:  Miguel Dibble, M.D.   DATE OF BIRTH:  [birth year]   DATE OF ADMISSION:  05/03/2007  DATE OF DISCHARGE:  LH                              HISTORY & PHYSICAL   CHIEF COMPLAINT:  Right flank pain, right upper ureter calculus with  obstruction, post right ureteral stent placement.   HISTORY OF PRESENT ILLNESS:  This 50 year old female has a history of  recurrent urolithiasis in the past.  She has undergone multiple  procedures for several kidney stones.  She has passed several stones in  the past.  She had onset of severe right flank pain radiating to the  front.  Evaluation was done with a noncontrast CT scan of the abdomen  and pelvis.  This revealed an 8-mm size stone in the right upper ureter  with obstruction and hydronephrosis.  The patient has undergone  cystoscopy, extraction of right distal ureteral calculus and stent  placement at [facility] last week.  She has good pain relief at  present.  She has had intermittent mild hematuria.  She is brought to  the short-stay center today for cystoscopy, right retrograde pyelogram,  right ureteroscopy, holmium laser lithotripsy, stone extraction and  stent placement.   PAST MEDICAL HISTORY:  1. History of recurrent urolithiasis.  2. Status post cesarean section.  3. Status post hysterectomy, cholecystectomy, gastric bypass surgery,      hernia repair.  4. History of hepatitis C with cirrhosis of the liver.  5. End-stage liver failure.  She is awaiting liver transplantation.   MEDICATION:  1. Nadolol 20 mg one p.o. daily.  2. Trimethoprim 100 mg p.o. daily.  3. Lasix 20 mg one p.o. daily.  4. Xifaxan 200 mg one p.o.  daily.  5. Spironolactone 50 mg two p.o. daily.  6. Feosol one p.o. daily.  7. Vitamin B complex one p.o. daily.  8. Multivitamin one p.o. daily.   ALLERGIES:  ANCEF.   EXAMINATION:  HEAD, EYES, EARS, NOSE AND THROAT:  Normal.  NECK:  No masses.  LUNGS:  Clear to auscultation.  HEART:  Regular rate and rhythm.  No murmurs.  ABDOMEN:  Soft.  The patient has ascites.  No palpable flank mass or costovertebral angle tenderness.  Bladder is  not palpable.  No suprapubic tenderness.   IMPRESSION:  Right upper ureter calculus with obstruction, right  hydronephrosis, right renal colic, post right ureteral stent placement.   PLAN:  Cystoscopy, right retrograde pyelogram, right ureteroscopy,  holmium laser lithotripsy of ureteral calculus, stone extraction and  stent placement in short-stay center.  A PT will be checked prior to the  surgery.  I have explained to the patient regarding the diagnosis,  operative details, alternate treatments, outcome, possible risks and  complications, and she has agreed for the procedure to be done.  In view  of her abnormal liver function, she may have bleeding problems and  persistent hematuria after the procedure.  This has been explained to  the patient.      Miguel Dibble, M.D.  Electronically Signed     SK/MEDQ  D:  05/03/2007  T:  05/03/2007  Job:  [id number]   cc:   Sherrilee Gilles. Gerarda Fraction, MD  [phone]   Hildred Laser, M.D.  Fax: [phone]

## 2010-06-22 NOTE — H&P (Signed)
[name], [name]                  ACCOUNT NO.:  [id number]   MEDICAL RECORD NO.:  [id number]          PATIENT TYPE:  AMB   LOCATION:  DAY                           FACILITY:  [facility]   PHYSICIAN:  Miguel Dibble, M.D.   DATE OF BIRTH:  1960-12-01   DATE OF ADMISSION:  04/26/2007  DATE OF DISCHARGE:  LH                              HISTORY & PHYSICAL   CHIEF COMPLAINT:  Right flank pain radiating to the front, right  ureteral calculus.   HISTORY OF PRESENT ILLNESS:  This 50 year old female was referred to me  by Dr. Laural Golden.  She has a history of recurrent urolithiasis in the past  year.  The last x-ray revealed multiple right renal calculi.  She also  has cirrhosis of the liver due to hepatitis C and end-stage liver  failure.  She is on the list for a liver transplantation.   She started having severe right flank pain radiating to the front since  yesterday morning.  Pain scale was 8/10.  She also had intermittent  nausea and vomiting.  She denied having any fever, chills, voiding  difficulty or gross hematuria.  She was seen in the office.  Other  evaluation was done with a noncontrast CT scan of the abdomen and  pelvis.  This revealed an 8-mm size right upper calculus with  hydronephrosis.  The patient has persistent pain.  She is brought to the  short-stay center today for cystoscopy, right retrograde pyelogram and  right ureteral stent placement.  Obstructing stone will later be treated  with ESL as an outpatient.   PAST MEDICAL HISTORY:  1. History of recurrent urolithiasis.  2. Cirrhosis of the liver.  3. Hepatitis C.  4. End-stage liver  failure.  5. History of umbilical hernia repair as well as ventral abdominal      hernia repair.   MEDICATIONS:  1. Metalol 20 mg one .p.o. daily.  2. Benaphen 100 mg one p.o. daily.  3. Furosemide 20 mg one p.o. daily.  4. Xifaxan 200 mg one p.o. daily.  5. Spironolactone 50 mg two twice a day.  6. Feosol one p.o. daily.  7. Vitamin  B complex p.o. daily.  8. Multivitamin one p.o. daily.   The patient is allergic to ANCEF.   EXAMINATION:  Height 5 feet 6 inches, weight 150 pounds.  HEAD, EYES, EARS, NOSE AND THROAT:  Normal.  LUNGS:  Clear to auscultation.  HEART:  Regular rate and rhythm.  No murmurs.  ABDOMEN:  The patient has ascites.  There is moderate right  costovertebral angle tenderness.  Bladder not palpable.   IMPRESSION:  1. Right upper ureteral calculus with obstruction.  2. Right hydronephrosis.  3. Right renal colic.  4. Cirrhosis of the liver.  5. Hepatitis C.  6. End-stage liver failure.   PLAN:  Cystoscopy, right retrograde pyelogram and right ureteral stent  placement in short-stay center.  I have discussed with the patient  regarding the diagnosis, operative details, alternative treatment,  outcome, possible risks and complications, and she has agreed for the  procedure to be  done.      Miguel Dibble, M.D.  Electronically Signed     SK/MEDQ  D:  04/26/2007  T:  04/26/2007  Job:  [id number]   cc:   Hildred Laser, M.D.  Fax: [place] Gerarda Fraction, MD  Fax: [phone]

## 2010-06-25 NOTE — Op Note (Signed)
[facility]  Patient:    [name], [name]                         MRN: [id number] Proc. Date: 01/21/00 Adm. Date:  [id number] Attending:  Aram Beecham                           Operative Report  PREOPERATIVE DIAGNOSIS:       Dysfunctional uterine bleeding, with abnormal sonohistogram.  POSTOPERATIVE DIAGNOSIS:      Dysfunctional uterine bleeding, with multiple endometrial polyps.  OPERATION:                    Hysteroscopy with D&C.  SURGEON:                      Daniel L. Cherylann Banas, M.D.  ANESTHESIA:                   General endotracheal.  FINDINGS:                     External vaginal examination is within normal limits.  The cervix is clean.  The uterus was taut, normal size and shape; with less than first-degree uterine _______.  Adnexae are not palpably enlarged.  At the the time of hysteroscopy the patient had multiple endometrial polyps, if not just irregular buildup of the endometrium from her Megace, which came out from the endometrial cavity from both lateral walls and from the posterior wall.  Once all these had been removed, the cavity itself was free of any other disease, and the entire endometrial cavity, lower uterine segment and the cervical canal could be well evaluated.  DESCRIPTION OF PROCEDURE:            After adequate general endotracheal anesthesia, the patient was placed in the dorsosupine position, prepped and draped in the usual sterile manner. A single-tooth tenaculum was placed in the anterior lip of the cervix.  The cervix was progressively dilated, initially dilatation went well, until we got to the 31 Pratt dilator.  Then it was difficult to dilate her past the 31 to a 33.  She had some cervical tearing at 12 oclock as a result of this.  We needed to dilate her to a 33 in order to introduce the hysteroscopic resectoscope.  It was felt that we should use the resectoscope because of the abnormal sonohistogram  done in the office. Finally, she was adequately dilated.  The hysteroscopic resectoscope was introduced to the cavity.  Then 35 Sorbitol was used to expand the intrauterine cavity.  A camera was used for visualization and a pump was used for pressure.  Multiple endometrial polyps were seen.  They were resected with a 90-degree wire loop at 70 coag 110 cutting current.  Finally, a curettage was also done to help clean out the cavity.  A significant amount of tissue was obtained, which also had a suspicious quality for malignancy.  Finally, the procedure could be completed and the cavity was cleaned.  Fluid deficit was 150 cc.  Blood loss was 50-70 cc.  The procedure was terminated at this point.  The anterior cervical laceration was sutured with a 0 Vicryl running suture to stop the bleeding and reapproximate the tissue.  The patient tolerated the procedure well and left the operating room in satisfactory condition. DD:  01/21/00 TD:  01/22/00 Job: [id number]

## 2010-06-25 NOTE — Op Note (Signed)
NAME:  TRUE, HINGLE                  ACCOUNT NO.:  [id number]   MEDICAL RECORD NO.:  [id number]          PATIENT TYPE:  AMB   LOCATION:  DAY                           FACILITY:  [facility]   PHYSICIAN:  Hildred Laser, M.D.    DATE OF BIRTH:  15-Aug-1960   DATE OF PROCEDURE:  06/30/2006  DATE OF DISCHARGE:                               OPERATIVE REPORT   PROCEDURE:  Esophagogastroduodenoscopy.   INDICATIONS:  [name] is a 50 year old Caucasian female with cirrhosis  secondary to hepatitis C which has been treated with sustained virologic  response who is undergoing EGD for staging of esophageal varices and  determine whether or not she would be a candidate for primary  prophylaxis for variceal bleed.  She had her last exam in September  2007.  She had one column grade 3, the others were small.  She is on  Nadolol and doing well.  She has completed evaluation for OLT and is  under the care of Dr. Lum Keas of [place] at  [place], [place].  The procedure risks were reviewed with the  patient and informed consent was obtained.   MEDS FOR CONSCIOUS SEDATION:  Benzocaine spray for pharyngeal topical  anesthesia, Demerol 50 mg IV, Versed 5 mg IV in divided dose.   FINDINGS:  The procedure was performed in the endoscopy suite.  The  patient's vital signs and O2 sat were monitored during the procedure and  remained stable.  The patient was placed in the left lateral position.  The Pentax videoscope was passed via the oropharynx without any  difficulty into the esophagus.   Esophagus:  The mucosa of the proximal segment was normal.  There were  two columns of esophageal varices starting at 24 cm from the incisors.  The column at 12 o'clock was grade 1 and were small, the one across was  grade 2, were medium size.  Distally, there was four columns, two were  short and small in size, the other two columns were medium, one at 3  o'clock was slightly tortuous but felt much less  than 1/3 of the lumen.  The varices were carefully examined for red wale markings or cherry red  spots, none were seen.  The GE junction was at 36 cm from the incisors.   Stomach:  She had a rather small gastric pouch which was 4-5 cm in  length.  The GE junction was well seen and no gastric varices were  identified.  Gastrojejunostomy was wide open.  The mucosa of the gastric  pouch was normal.  There was some erythema at the gastrojejunostomy  which was patent.   Small bowel:  The jejunum was examined for 20 cm and mucosa and folds  were normal and no other vascular abnormalities were noted.  The  endoscope was withdrawn.  It was decided not to treat the patient as  discussed below.   FINAL DIAGNOSIS:  Small to medium sized esophageal varices, distally two  columns are short, less than 5 cm, the other two columns are long, one  at 3 o'clock and  other one at 8 o'clock.  No wale markings or cherry red  spots were seen.  Comparison was made with endoscopic pictures from the  September 2007 study and these varices, if anything, appeared to be less  prominent, although I may add those pictures were taken with the Olympus  system.   A very small gastric pouch with patent gastrojejunostomy.   Normal jejunal mucosa for 20 cm.   Since patient's varices have not progressed in size and she does not  have any cherry red spots or read wale markings, will continue to treat  her with nadolol which she has been on for awhile.   RECOMMENDATIONS:  She will resume her usual meds including nadolol.  I  will be contacting her with ultrasound result which was just completed.  We will plan to reassess her endoscopically in 6-12 months per  recommendations by her transplant hepatologist.      Hildred Laser, M.D.  Electronically Signed     NR/MEDQ  D:  06/30/2006  T:  06/30/2006  Job:  [id number]   cc:   Lum Keas, M.D.   Natasha Gilles. Gerarda Fraction, MD  Fax: [phone]

## 2010-06-25 NOTE — Letter (Signed)
March 01, 2006    Hildred Laser, M.D.  [address]  [place], [place] [address]   RE:  Natasha, Russell  MRN:  [id number]  /  DOB:  [birth year]   Dear Bernadene Person:   It was my pleasure evaluating [name] in the office today in  consultation at your request.  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  She also has esophageal varices.  She is a candidate  for possible hepatic transplantation.  Cardiology evaluation and stress  testing was requested by the physicians at [facility].   The patient has undergone cardiac catheterization on 2 occasions.  In  2002 she was said to have non-critical coronary disease.  She was  evaluated by Dr. Gwenlyn Found in 2004 with chest pain and EKG abnormalities.  Cardiac catheterization was entirely normal at that time.   [name] has a history of obesity, having undergone bariatric surgery at  [facility] in 2005.  She has subsequently experienced a 160 pound weight  loss.   PAST MEDICAL HISTORY:  Otherwise notable for hospitalization for urinary  tract infection in 2006.  She has since had multiple procedures and  surgeries, including bilateral hernia repair.  Followup surgery for a  leak related to a bariatric surgery, a remote C-section, TAH/BSO in  2003, and arthroscopic right knee surgery.  She reports an allergy to  CEPHALOSPORINS.   CURRENT MEDICATIONS:  1. Include spironolactone 100 mg b.i.d.  2. Furosemide 20 mg 3 days per week.  3. Trimethoprim 100 mg nightly  4. Nadolol 20 mg daily.  5. Xifaxan 200 mg daily.   SOCIAL HISTORY:  Employed as an Glass blower/designer; fairly active lifestyle;  married with 1 child.   FAMILY HISTORY:  Father died due to complications related to a hip  fracture; mother is alive and well at an advanced age.  She has 1  brother who is alive and health.   REVIEW OF SYSTEMS:  Notable for the need for corrective lenses.  A  history of heart murmur.  A  history of GERD.  Premenopausal status.  Some edema of the lower extremities.  All other systems are reviewed and  are negative.   EXAM:  Pleasant woman in no acute distress.  The weight is 155 pounds, blood pressure 105/70, heart rate 70 and  regular, respirations 16.  HEENT:  Anicteric sclerae; normal lens and conjunctivae.  NECK:  No jugular venous distension; normal carotid upstrokes without  bruits.  LUNGS:  Clear.  CARDIAC:  Normal first and second heart sounds; grade 2/6 systolic  ejection murmur at the upper left sternal border.  ABDOMEN:  Soft and nontender; normal bowel sounds; no masses; liver edge  at the right costal margin; enlarged spleen palpable at least 4 cm below  the left costal margin.  EXTREMITIES:  Trace edema; distal pulses intact.  NEUROMUSCULAR:  Symmetric strength and tone; normal cranial nerves.  PSYCHIATRIC:  Alert and oriented; normal affect.   EKG:  Normal sinus rhythm; cannot rule out prior inferior myocardial  infarction; cannot rule out prior anterior myocardial infarction.   IMPRESSION:  [name] has few cardiovascular risk factors, especially  since substantial weight loss following bariatric surgery.  She had  normal coronary arteries 3 years ago.  The likelihood of her developing  significant cardiac disease is virtually nil.  To meet the requirements  of her transplant team, a stress nuclear  study will be obtained.  I will  report the results of that study to her when available.  Thanks so much  for sending this nice woman to me.    Sincerely,      Cristopher Estimable. Lattie Haw, MD, [facility]  Electronically Signed    RMR/MedQ  DD: 03/01/2006  DT: 03/01/2006  Job #: [id number]   CC:    Sherrilee Gilles. Gerarda Fraction, MD

## 2010-06-25 NOTE — Discharge Summary (Signed)
NAME:  ASIE, BONNETTE                            ACCOUNT NO.:  [id number]   MEDICAL RECORD NO.:  [id number]                   PATIENT TYPE:  INP   LOCATION:  2024                                 FACILITY:  [facility]   PHYSICIAN:  Bryson Dames, M.D.             DATE OF BIRTH:  29-Jul-1960   DATE OF ADMISSION:  DATE OF DISCHARGE:                                 DISCHARGE SUMMARY   HISTORY:  The patient is a 50 year old white female patient who was referred  to [place] by Dr. Caron Presume because of chest pain. She was seen in his office on  the day of admission.  She apparently had chest tightness through to her  back and (I believe) while at work, she had nausea and vomiting, diaphoresis  with radiation to her bilateral arms and neck.  It started at 8:30 and it  lasted for more than 30 minutes.  It was a 10+/10 pain.  She felt a little  bit presyncopal with it.  The patient was transferred by EMS, was given 3  nitroglycerin sprays, chewed 2 aspirins and she obtained relief of pain, but  she still had some tightness and left neck soreness.  She had a similar  episode and a cardiac catheterization by Dr. Glade Lloyd in the year 2002, at  which time she had a 20% LAD, normal coronaries with normal LV function.   Other cardiac risk factors include onset diabetes mellitus type 2 with blood  sugars not controlled recently, obesity, hypertension and premature family  history of CAD.   HOSPITAL COURSE:  She was seen in the ER by Dr. Odette Fraction.  Her EKG was  without any significant change.  Her first CK-MB was initially negative.  They were unable to get an IV line in her in the emergency room, thus it was  decided that she would undergo cardiac catheterization because of her  prolonged symptoms and her cardiac risk factors. Thus, she was taken for  cardiac catheterization which was done by Dr. Quay Burow. She had normal  coronaries, normal LV, her aortic arch was normal without any dissection.   A  2-D echo was also done which was also normal with normal LV-function, and no  valvular abnormalities.  She was seen by Dr. Melvern Banker on 03/13/02, considered  stable to be discharged home. Her right groin was without any hematoma. She  had 2+ right dorsalis pedis pulse.   We decided to keep her on Toprol  XL 50 mg once a day because of elevated  blood pressure which was not initially elevated on her presentation to the  emergency room, however, when we first saw her, she was on IV nitroglycerin.  She will see Dr. Ronnald Collum as an outpatient for her diabetes mellitus.   LABORATORY DATA:  Done at [facility] the previous day.  Morning labs were  still pending at the time of this  discharge.   DISCHARGE MEDICATIONS:  1. Glucophage - she should hold until 03/13/02.  2. Actos 30 mg 1 per day.  3. Effexor dosages at home.  4. Aciphex 20 mg once per day.  5. Avapro as at home.  6. BuSpar as at home.  7. Aspirin 81 mg once per day.  8. Toprol  XL 50 mg once per day.   DISPOSITION:  Activities:  No strenuous activity, no lifting or driving x3  days. She may go to work on Monday. She will see Dr. Ronnald Collum for her  diabetes. The patient will follow up with Dr. Mathis Bud for a groin check on  2/11 at 11:45.   DISCHARGE DIAGNOSES:  1. Chest pain, unknown etiology, not ischemic related with normal     coronaries.  2. Hypertension, not controlled.  3. Diabetes mellitus, adult onset type 2, not on insulin, not controlled.  4. Obesity.  5. Premature family history.  6. Status post catheterization with normal coronaries and normal left     ventricular function.  A 2-D echo also done with normal valves.     Cyndia Bent, N.P.                        Bryson Dames, M.D.    BB/MEDQ  D:  03/13/2002  T:  03/13/2002  Job:  [id number]   cc:   Lenard Simmer, M.D.  [place]  [place] [address]  Fax: [phone]   Bonne Dolores, M.D.  [address] [place]  [place] [address]   Fax: [phone]

## 2010-06-25 NOTE — Op Note (Signed)
[name], [name]                  ACCOUNT NO.:  [id number]   MEDICAL RECORD NO.:  [id number]          PATIENT TYPE:  EMS   LOCATION:  ED                            FACILITY:  [facility]   PHYSICIAN:  Hildred Laser, M.D.    DATE OF BIRTH:  01-09-1961   DATE OF PROCEDURE:  04/30/2005  DATE OF DISCHARGE:  04/30/2005                                 OPERATIVE REPORT   PROCEDURE:  Attempted abdominal paracentesis.   INDICATIONS:  [name] is 50 year old Caucasian female with known cirrhosis  secondary to hepatitis C which has been successfully treated, who presents  with abdominal distension and pain.  She is undergoing diagnostic tap to  rule out SVP.  Informed consent for the procedure was obtained from the  patient.   FINDINGS:  The patient was placed in supine position.  Two pillows were  placed under left flank and she was tilted to the right side.  I decided to  go above the level where she had skin erythema.  The skin was prepped in the  usual fashion with Betadine solution as well as using sterile sheet.  Then  1% Xylocaine was injected in the skin and subcutaneous tissue using 25-gauge  needle.  Deeper injection was made with 22-gauge spinal needle which was  introduced into the peritoneal cavity, but there was no fluid that could be  aspirated.  Needle was withdrawn.  Puncture site was covered with a Band-  Aid.  The patient tolerated the procedure well.   FINAL DIAGNOSIS:  Dry abdominal tap.   PLAN:  1.  We will get a urinalysis and culture if she has white cells.  2.  Start on Levaquin 750 mg p.o. daily.  3.  She will call the office with progress report in 2 days.  If she does      not feel a lot better will consider ultrasound-guided paracentesis, if      she has enough fluid.      Hildred Laser, M.D.  Electronically Signed     NR/MEDQ  D:  04/30/2005  T:  05/02/2005  Job:  [id number]

## 2010-06-25 NOTE — H&P (Signed)
NAMESELINDA, [name]                  ACCOUNT NO.:  [id number]   MEDICAL RECORD NO.:  [id number]          PATIENT TYPE:  INP   LOCATION:  [id number]                          FACILITY:  [facility]   PHYSICIAN:  Leonides Grills, M.D.DATE OF BIRTH:  [birth year]   DATE OF ADMISSION:  12/09/2003  DATE OF DISCHARGE:  LH                                HISTORY & PHYSICAL   CHIEF COMPLAINT:  Aching all over with fever.   PRESENTING ILLNESS:  This is a 50 year old female with cirrhosis secondary  to hepatitis C.  The patient stated that she was in her usual state of good  health until two days before admission, when she began having some fever and  aching chills, progressive in nature.  Also of note, she had a paracentesis  performed 1-1/2 weeks ago by Dr. Laural Golden.  The patient in the emergency room  was found to be febrile with many white cells in her urine.  She is admitted  for presumptive pyelonephritis.   PAST MEDICAL HISTORY:  Medications include:  1.  Lasix 20 mg twice a day.  2.  Spironolactone, dosage unavailable at this time.   She is allergic to ANCEF, which gives her a rash and hives.   She is status post gastric bypass surgery, also status post cholecystectomy  and status post hysterectomy.   REVIEW OF SYSTEMS:  She does have some recent nausea.  Bowel movements are  normal.  She denies chest pain, shortness of breath.  She does have a  headache and body aches.   PHYSICAL EXAMINATION:  GENERAL:  A middle-aged female who appears miserable.  VITAL SIGNS:  Her temperature is 102.5, blood pressure is 120/60, pulse is  100 and regular, respirations 20 and unlabored.  HEENT:  TMs normal.  Pupils equal and reactive to accommodation.  NECK:  Supple without JVD, bruit, or thyromegaly.  CHEST:  Lungs clear in all areas.  CARDIAC:  Regular sinus rhythm without murmur, gallop, or rub.  ABDOMEN:  Mildly distended but soft, nontender.  EXTREMITIES:  Without clubbing, cyanosis, or edema.  NEUROLOGIC:  Grossly intact.   ASSESSMENT:  1.  Pyelonephritis, acute.  2.  History of cirrhosis secondary to hepatitis C.  3.  Status post gastric bypass surgery.  4.  Status post hysterectomy.  5.  Status post cholecystectomy.     Will   WMM/MEDQ  D:  12/10/2003  T:  12/10/2003  Job:  [id number]

## 2010-06-25 NOTE — Op Note (Signed)
NAME:  [name], [name]                            ACCOUNT NO.:  [id number]   MEDICAL RECORD NO.:  [id number]                   PATIENT TYPE:  AMB   LOCATION:  DAY                                  FACILITY:  [facility]   PHYSICIAN:  Hildred Laser, M.D.                 DATE OF BIRTH:  1960/08/17   DATE OF PROCEDURE:  DATE OF DISCHARGE:                                 OPERATIVE REPORT   PROCEDURE:  Esophagogastroduodenoscopy.   ENDOSCOPIST:  Hildred Laser, M.D.   INDICATIONS:  Patient is a 50 year old Caucasian female with multiple  medical problems who had bariatric surgery in March of this year for morbid  obesity.  She has now been having nausea, vomiting, and epigastric pain.  She also is having hematuria and has kidney stones for which she is being  evaluated by a urologist.  She was seen by Dr. Toney Rakes last week and he is  concerned that she may have marginal ulcer; and, therefore, requested EGD.  She also has biopsy proven hepatitis C cirrhosis and plan is for her to be  treated sometime this year once she has fully recovered from hepatitis C and  the plan is for her to undergo treatment starting sometime this year.  She  was begun on Aciphex and Carafate and feels better.   Procedure and risks were reviewed with the patient and informed consent was  obtained.   PREOPERATIVE MEDICATIONS:  Cetacaine spray for oropharyngeal topical  anesthesia, Demerol 50 mg IV and Versed 6 mg IV.   FINDINGS:  Procedure performed in endoscopy suite.  The patient's vital  signs and O2 saturation were monitored during the procedure and remained  stable.  The patient was placed in the left lateral recumbent position and  Olympus videoscope was passed via the oropharynx without any difficulty into  the esophagus.   ESOPHAGUS:  Mucosa of the esophagus was normal throughout.  There were 2  columns of esophageal varices, grade 2, one was slightly prominent to the  other one.  These involved the distal 7-8  cm of the esophagus.  GE junction  was at 38 cm from the incisors.   STOMACH:  A small gastric pouch with 5-6 cm length.  There was a linear area  of mucosal erythema/edema along the lesser curvature which was felt to be  consistent with healing Mallory-Weiss.  Anastomosis was consistent with  Mallory-Weiss tear.  The gastrojejunal anastomosis was patent.  Three  metallic clips were seen and there was some mucosal erythema, but no ulcers  were noted.   The small bowel jejunum was examined for several centimeters and was normal.   Endoscope was withdrawn.  The patient tolerated the procedure well.   FINAL DIAGNOSES:  1. No evidence of marginal ulcer, but she has mucosal irritation at     anastomosis.  2. Healing Mallory-Weiss tear.  3. Grade 2, two columns of  esophageal varices.   RECOMMENDATIONS:  She will continue Carafate and Aciphex for another 4  weeks.  After that, she will drop Carafate, but continue Aciphex until her  office visit.      ___________________________________________                                            Hildred Laser, M.D.   NR/MEDQ  D:  08/28/2003  T:  08/28/2003  Job:  [id number]   cc:   Dr. Toney Rakes  Department of Surgery at [place]. Gerarda Fraction, M.D.  [address] [address]  Fax: [phone]

## 2010-06-25 NOTE — H&P (Signed)
[name], [name]                  ACCOUNT NO.:  [id number]   MEDICAL RECORD NO.:  [id number]          PATIENT TYPE:  EMS   LOCATION:  ED                            FACILITY:  [facility]   PHYSICIAN:  Hildred Laser, M.D.    DATE OF BIRTH:  1960/10/22   DATE OF ADMISSION:  04/30/2005  DATE OF DISCHARGE:  03/24/2007LH                                HISTORY & PHYSICAL   PRESENTING COMPLAINT:  Abdominal distention, no abdominal pain.   HISTORY OF PRESENT ILLNESS:  [name] is a 50 year old Caucasian female who  called me this evening stating she has been having pain across her lower  abdomen with swelling.  She was concerned that her ascites was  reaccumulating.  She has been on spirolactone 50 mg b.i.d. and Lasix 20 mg  every other day was added earlier this week when she was seen in the office.  She felt that this area was burning and was hot.  She was therefore asked to  come to the emergency room.  She has not experienced any fever or chills,  nausea or vomiting.  She denies dysuria or diarrhea.  She feels she has  gained a few more pounds since she was seen in our office.  Her weight  tracking in the office revealed that she had gained 18 pounds in about 5-6  weeks.   Von has hepatitic C with cirrhosis, biopsy proven.  She is status post 12  months of antiviral therapy with Pegasys and Copegus.  Her HCV RNA at end of  treatment was negative.  However, when I saw her in the office earlier this  week, I noted her transaminases were mildly elevated.  No less than 50 RNA.  HCV RNA by PCR was checked but is still pending.   PHYSICAL EXAMINATION:  VITAL SIGNS:  She is afebrile.  GENERAL APPEARANCE:  She does not appear to be distressed.  LUNGS:  Clear.  ABDOMEN:  Protuberant, obese.  There is some redness to the skin across the  lower half of the abdomen.  She has umbilical hernia which is mildly tender,  not reducible.  This is not a new findings.  She has a large scar across the  lower  abdomen from prior hysterectomy which is complicated by an infection.  No hernias noted in her inguinal area in supine position, dull.  Flanks are  dull.  There is some shifting.  She has trace edema to her legs.   ASSESSMENT:  [name] presents with lower abdominal pain and progressive  distention.  She has mild erythema to her skin without any skin lesion or  ulceration.  I wonder if she has mild cellulitis which may be due to a  congestion due to gravitational changes or even infection.  She has two  hernias.  Umbilical hernia may be a source of some of her pain.  I doubt  that she has spontaneous bacterial peritonitis, but given that she has  cirrhosis and has history of ascites, this needs to be ruled out, although  my index of suspicion is very low.  PLAN:  Diagnostic abdominal paracentesis.  I explained the procedure to the  patient.  She is agreeable and informed consent was obtained.      Hildred Laser, M.D.  Electronically Signed     NR/MEDQ  D:  04/30/2005  T:  05/02/2005  Job:  [id number]

## 2010-06-25 NOTE — Op Note (Signed)
NAME:  [name], [name]                            ACCOUNT NO.:  [id number]   MEDICAL RECORD NO.:  [id number]                   PATIENT TYPE:  AMB   LOCATION:  DAY                                  FACILITY:  [facility]   PHYSICIAN:  Hildred Laser, M.D.                 DATE OF BIRTH:  [birth year]   DATE OF PROCEDURE:  04/15/2003  DATE OF DISCHARGE:                                 OPERATIVE REPORT   PROCEDURE:  Esophagogastroduodenoscopy.   INDICATIONS:  Mazal is a 50 year old Caucasian female who is scheduled to  undergo bariatric surgery for morbid obesity.  We saw Anistyn a few months ago  because of hepatitis C.  She has had mild thrombocytopenia.  Therefore, EGD  was recommended prior to bariatric surgery to make sure she does not have  large varices.  The patient is a candidate for antiviral therapy but the  plan is delayed until she has had bariatric surgery.  Procedure was reviewed  with the patient and informed consent was obtained.   PREMEDICATION:  Cetacaine spray for pharyngeal topical anesthesia, Demerol  50 mg IV, Versed 8 mg IV in divided doses.   FINDINGS:  The procedures were performed in the endoscopy suite.  The  patient's vital signs and O2 saturation were monitored during the procedure  and remained stable.   The patient was placed in the left lateral recumbent position and Olympus  videoscope was passed through the oropharynx without any difficulty into the  esophagus.   Esophagus:  Mucosa of the proximal segment was normal.  There were two  columns of esophageal varices involving the distal 12 cm.  The esophagus  distally were three columns.  The GE junction was at 38 cm from the  incisors.  One column at 6-7 o'clock was grade 2.  The other ones were much  smaller and grade 1.  They did not have any red or __________ markings.  The  GE junction was unremarkable.   Stomach:  It was empty and distended very well on insufflation.  Folds of  the proximal stomach were  normal.  Examination of the mucosa revealed  mucosal edema and streaks of linear erythema in the body and antrum.  No  erosions or ulcers were noted.  Pyloric channel was patent.  Angularis,  fundus and cardia were examined by retroflexing the scope and were normal.   Duodenum:  Examination of the bulb revealed normal mucosa.  Mucosa and folds  were normal in the second part of the duodenum were normal.   The endoscope was withdrawn.  The patient tolerated the procedure well.   FINAL DIAGNOSIS:  1. Three columns of esophageal varices.  Two columns are small and grade 1;     other column is grade 2.  2. Gastric changes are possibly indicated for mild portal gastropathy.     However, Helicobacter pylori infection needs to be  ruled out.   RECOMMENDATIONS:  1. H pylori serology will be checked.  2. Will discuss endoscopic findings with Dr. Toney Rakes at [facility].  We will     that it increases the risk of surgery; however, we fill that this is her     best chance for long-term prognosis as a combination of hepatitis C and     __________ her hepatic injury.  Therefore, would be inclined to proceed     with bariatric surgery when she can have liver biopsy sent on.  The     patient needs to understand the risk of surgery is somewhat increased.      ___________________________________________                                            Hildred Laser, M.D.   NR/MEDQ  D:  04/15/2003  T:  04/15/2003  Job:  [id number]   cc:   Sherrilee Gilles. Gerarda Fraction, M.D.  [address] [place] [address]  Fax: [place]. Toney Rakes, M.D.  [place]

## 2010-06-25 NOTE — Cardiovascular Report (Signed)
[place]  Patient:    [name], [name]                         MRN: [id number] Proc. Date: 03/20/00 Adm. Date:  [id number] Attending:  Lisbeth Renshaw CC:         [facility]                        Cardiac Catheterization  PROCEDURES: 1. Left heart catheterization. 2. Coronary cineangiography. 3. Left ventricular cineangiography. 4. Abdominal aortogram. 5. Perclose of the right femoral artery.  INDICATION FOR PROCEDURES:  This 50 year old female with diabetes and hypertension has a very high-risk profile with a very strong family history of coronary artery disease.  She was admitted on March 18, 2000, with prolonged and severe anterior chest pain which felt like someone was sitting on her chest.  Her enzymes were negative.  With the prolonged chest pain and very high-risk profile, she was considered to be a candidate for cardiac catheterization.  PROCEDURE IN DETAIL:  After signing an informed consent, the patient was premedicated with 50 mg of Benadryl intravenously and brought to the cardiac catheterization lab.  Her right groin was prepped and draped in a sterile fashion and anesthetized locally with 1% lidocaine.  A 6-French introducer sheath was inserted percutaneously into the right femoral artery; 6-French, #4 Judkins coronary catheters were used to make injections into the coronary arteries.  A 6-French pigtail catheter was used to measure pressures in the left ventricle and aorta and to make midstream injections into the left ventricle and abdominal aorta.  The patient tolerated the procedure well, and no complications were noted.  At the end of the procedure, the catheter and sheath were removed from the right femoral artery, and hemostasis was easily obtained with a Perclose closure system.  MEDICATIONS GIVEN:  None.  HEMODYNAMIC DATA:  Left ventricular pressure 138/0 to 17, aortic pressure 138/86  with a mean of 106.  Left ventricular ejection fraction 60%. CINE FINDINGS:  CORONARY CINEANGIOGRAPHY:  Left coronary artery: The ostium and left main appear normal.  Left anterior descending artery: There was a minor 20% stenotic plaque at the takeoff of the LAD.  This was noncalcified and eccentric, and no other abnormality was noted in the LAD or diagonal branches.  There was very normal antegrade flow throughout the entire LAD.  Circumflex coronary artery appears normal.  Right coronary artery appears normal.  LEFT VENTRICULAR CINEANGIOGRAM:  Left ventricular chamber size, contractility, and wall thickness appeared normal.  The ejection fraction was measured at 60%.  The mitral and aortic valves appeared normal.  There was no evidence for mitral insufficiency, calcification, or prolapse.  ABDOMINAL AORTOGRAM:  The abdominal aorta and renal arteries appeared normal. There was no evidence of atherosclerosis.  FINAL DIAGNOSES: 1. Minor coronary artery disease with 20% stenotic plaque proximal left    anterior descending artery and normal flow. 2. Normal coronary arteries otherwise with normal flow. 3. Normal left ventricular function. 4. Normal mitral and aortic valves. 5. Normal abdominal aorta and renal arteries. 6. Successful Perclose of the right femoral artery.  DISPOSITION:  The patient was transferred to the short-stay unit to anticipate discharge in several hours. DD:  03/20/00 TD:  03/20/00 Job: [id number]

## 2010-06-25 NOTE — Cardiovascular Report (Signed)
NAME:  [name], [name]                            ACCOUNT NO.:  [id number]   MEDICAL RECORD NO.:  [id number]                   PATIENT TYPE:  INP   LOCATION:  1824                                 FACILITY:  [facility]   PHYSICIAN:  Quay Burow, M.D.                DATE OF BIRTH:  10-10-1960   DATE OF PROCEDURE:  DATE OF DISCHARGE:                              CARDIAC CATHETERIZATION   PROCEDURE PERFORMED:  Cardiac catheterization.   CARDIOLOGIST:  Quay Burow, M.D.   INDICATIONS FOR PROCEDURE:  The patient is a 50 year old mildly overweight  white female who underwent cardiac catheterization March 20, 2000 by Dr.  Fernanda Drum revealing noncritical CAD.  She was admitted today with  chest pain and nonspecific ST and T wave changes.  She presents now for  diagnosis through coronary angiography.   DESCRIPTION OF PROCEDURE:  The patient was brought to the 2nd floor [facility] cardiac cath lab in the postabsorptive state.  She was premedicated  with p.o. Valium.  The right groin was prepped and shaved in the usual  sterile fashion.  One percent Xylocaine was used for local anesthesia.  A 6  French sheath was inserted into right femoral artery using the standard  Seldinger technique.  A 6 French sheath was inserted into the right femoral  vein because we wanted to obtain peripheral venous access.  Six French right  and left Judkins silastic catheters as well as a 6 French pigtail catheter  were used for selective coronary angiography, left ventriculography and  supravalvular aortography in the LAO cranial view to rule out dissection.  Omnipaque dye was used for the entirety of the case.  Retrograde aortic,  ventricular and pullback pressures were recorded.   RESULTS:   HEMODYNAMICS:  1. Aortic systolic pressure 0000000, diastolic pressure 85.  2. Left ventricular systolic pressure [id number] and diastolic pressure 18.  3. There was no obvious pullback gradient noted.   SELECTIVE  CORONARY ANGIOGRAPHY:  1. Left main normal.  2. LAD normal.  3. Left circumflex normal.  4. Right coronary artery is dominant, normal.   LEFT VENTRICULOGRAPHY:  RAO left ventriculogram was performed using 25 mL of  Omnipaque dye at 75mL/second.  The overall LVEF was estimated at greater  than 60% without focal wall motion abnormalities.   SUPRAVALVULAR AORTOGRAPHY:  Performed in the LAO cranial view using 30 mL of  Omnipaque dye at 20 mL/second.  There was no AI noted.  The caliber of the  ascending aorta and arch were normal.  All arch vessels were intact.  There  was no evidence of dissection.   IMPRESSION:  The patient has essentially normal coronary arteries, no left  ventricular function and no evidence of aortic dissection.  I believe her  chest pain is noncardiac, but may be gastrointestinal.   PLAN:  1. She will be treated empirically with antireflux measures.  2.  She will be discharged home in the morning and will follow up with her     primary care physician.   DISPOSITION:  Manual compression was held on the groin to achieve  hemostasis.  The patient left the lab in stable condition.                                               Quay Burow, M.D.    [name]  D:  03/12/2002  T:  03/13/2002  Job:  [id number]   cc:   [facility]  [place], [place]   Bonne Dolores, M.D.  [address] [place] [address]  Fax: [phone]

## 2010-06-25 NOTE — Op Note (Signed)
NAME:  [name], [name]                  ACCOUNT NO.:  [id number]   MEDICAL RECORD NO.:  [id number]          PATIENT TYPE:  AMB   LOCATION:  DAY                           FACILITY:  [facility]   PHYSICIAN:  Hildred Laser, M.D.    DATE OF BIRTH:  [birth year]   DATE OF PROCEDURE:  11/21/2003  DATE OF DISCHARGE:                                 OPERATIVE REPORT   PROCEDURE:  Esophagogastrojejunoscopy.   INDICATIONS:  [name] is a 50 year old Caucasian female who has recurrent  nausea and vomiting. She is status post bariatric surgery in March 2005 for  morbid obesity.  She was seen by Dr. Toney Rakes last week at [facility] last  week.  He was concerned that anastomosis could have strictured and,  therefore, recommended EGD and possible dilation of gastrojejunostomy.  The  patient is also complaining of abdominal distention and discomfort.  I had  her undergo abdominal paracentesis by Dr. Glennon Mac under ultrasound guidance  and he has removed over a liter of fluid and cell count is still pending.  Procedure and risks were reviewed with the patient and informed consent was  obtained.   PREMEDICATION:  Cetacaine spray for oropharyngeal topical anesthesia,  Demerol 50 mg IV, Versed 5 mg IV in divided doses.   FINDINGS:  The procedures were performed in the endoscopy suite.  The  patient's vital signs and O2 saturation were monitored during the procedure  and remained stable.   The patient was placed in the left lateral recumbent position and Olympus  videoscope was passed through the oropharynx without any difficulty into the  esophagus.   Esophagus:  Mucosa of the esophagus was normal throughout.  There were three  columns of varices noted in the distal 7-8 cm.  Two columns of grade 2.  The  other one was very tiny.  GE junction was at 38 cm from the incisors.   Stomach:  Gastric pouch was 5 cm along the lesser curvature.  Anastomosis  was felt to be patent.  A few metallic clips were noted.  There was  no food  debris in the gastric pouch.  There was some erythema at the anastomosis but  no ulcers or erosions are noted.  Mucosa inflammation was much less than was  noted on EGD three months ago.   Small bowel:  Jejunum was examined for several centimeters and was normal.  Scope was pulled back to the gastric pouch.  Anastomosis was dilated to 18  mm balloon.  This resulted in very slight mucosal disruption indicating that  anastomosis was not critically narrowed.  Multiple pictures were taken.  The  endoscope was withdrawn.  The patient tolerated the procedure well.   FINAL DIAGNOSIS:  1.  Three columns of esophageal varices.  Two columns are grade 2.  Third      one is very tiny.  2.  Small gastric pouch with patent gastrojejunostomy.  Anastomosis was      dilated to 18 mm, hoping that it might help her nausea and vomiting.  3.  Normal jejunum for over 20 mm.  RECOMMENDATIONS:  She will continue usual medictations.  If her cell count  is elevated on ascitic fluid, may consider giving her IV antibiotic therapy.     [name]   NR/MEDQ  D:  11/21/2003  T:  11/21/2003  Job:  [id number]   cc:   Abel Presto, M.D.  [facility] Dept of Surgery

## 2010-06-25 NOTE — Procedures (Signed)
NAMEBERDA, CORONEL                  ACCOUNT NO.:  [id number]   MEDICAL RECORD NO.:  [id number]          PATIENT TYPE:  OUT   LOCATION:  RAD                           FACILITY:  [facility]   PHYSICIAN:  Scarlett Presto, M.D.   DATE OF BIRTH:  May 28, 1960   DATE OF PROCEDURE:  02/24/2005  DATE OF DISCHARGE:                                  ECHOCARDIOGRAM   REFERRING PHYSICIAN:  Dr. Laural Golden.   TAPE NUMBER:  [id number].  TAPE COUNT:  [id number] through [place].   HISTORY OF PRESENT ILLNESS:  This is a 50 year old woman with acute onset  confusion and a systolic murmur. Technical quality of the study is adequate.   M-MODE TRACING:  The aorta is 27 mm.   The left atrium is 40 mm.   The septum is 12 mm.   Posterior wall is 12 mm.   Left ventricular diastolic dimension is 45 mm.   Left ventricular systolic dimension is 26 mm.   TWO-DIMENSIONAL AND DOPPLER IMAGING:  The left ventricle is normal size.  There is vigorous LV systolic function with an ejection fraction greater  than 75%. There is mild concentric left ventricular hypertrophy.   Right ventricle is normal size with normal systolic function.   The atria are top normal in size.   The aortic valve is sclerotic and no stenosis or regurgitation.   The mitral valve has mild annular calcification with no regurgitation or  stenosis.   The tricuspid valve has trace regurgitation.   There is no pericardial effusion.   Inferior vena cava is not well seen.   ASSESSMENT:  No obvious source of embolus.      Scarlett Presto, M.D.  Electronically Signed     JH/MEDQ  D:  02/24/2005  T:  02/25/2005  Job:  [id number]

## 2010-06-25 NOTE — Op Note (Signed)
NAME:  [name], [name]                  ACCOUNT NO.:  [id number]   MEDICAL RECORD NO.:  [id number]          PATIENT TYPE:  AMB   LOCATION:  DAY                           FACILITY:  [facility]   PHYSICIAN:  Hildred Laser, M.D.    DATE OF BIRTH:  05-19-1960   DATE OF PROCEDURE:  04/04/2006  DATE OF DISCHARGE:                               OPERATIVE REPORT   PROCEDURE:  Colonoscopy with polypectomy.   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  She was found to have heme-  positive stools and therefore undergoing diagnostic colonoscopy.  Procedure risks were reviewed with the patient, informed consent was  obtained.   MEDS FOR CONSCIOUS SEDATION:  Demerol 50 mg IV, Versed 5 mg IV.   FINDINGS:  Procedure performed in endoscopy suite.  The patient's vital  signs and O2 sat were monitored during procedure and remained stable.  The patient was placed left lateral position and rectal examination  performed.  No abnormality noted on external or digital exam.  Pentax  videoscope was placed rectum and advanced under vision into sigmoid  colon beyond.  Preparation was excellent except she had some liquid  stool in right colon.  She had few small diverticula at sigmoid colon.  There was more or less diffuse mucosal edema sparing the part of the  ascending colon.  Few diverticular also noted at the ascending colon,  hepatic flexure.  Hepatic flexure was very tortuous.  The patient in  supine position using abdominal pressure, I was able to pass the scope  into cecum which was identified by a lipomatous ileocecal valve and  appendiceal orifice.  Pictures taken for the record.  As the scope was  withdrawn colonic mucosa was carefully examined.  There was a 6 mL polyp  proximal transverse colon which was snared and retrieved for histologic  examination.  There was  no oozing noted from polypectomy site.  Pictures  taken for the record.  Mucosa rest of colon was normal..  No other  mucosal abnormalities noted.  Rectal mucosa was normal.  Scope was  retroflexed to examine anorectal junction and small hemorrhoids noted  below the dentate line.  Endoscope was straightened and withdrawn.  The  patient tolerated the procedure well.   FINAL DIAGNOSIS:  1. Examination performed to cecum.  2. 6 mL polyp snared from proximal transverse colon.  3. More or less diffuse edema to the colonic mucosa felt to be      secondary to underlying cirrhosis with portal hypertension.  4. Small external hemorrhoids.   RECOMMENDATIONS:  Standard instructions given.  High-fiber diet.  I will  be contacting the patient with results of biopsy.      Hildred Laser, M.D.  Electronically Signed     NR/MEDQ  D:  04/04/2006  T:  04/04/2006  Job:  [id number]   cc:   Sherrilee Gilles. Gerarda Fraction, MD  Fax: [phone]   Ms. Tomi Bamberger [place], Liver trans coord

## 2010-06-25 NOTE — Op Note (Signed)
[name], WAGENMAN                  ACCOUNT NO.:  [id number]   MEDICAL RECORD NO.:  [id number]          PATIENT TYPE:  AMB   LOCATION:  DAY                           FACILITY:  [facility]   PHYSICIAN:  Hildred Laser, M.D.    DATE OF BIRTH:  29-May-1960   DATE OF PROCEDURE:  10/28/2005  DATE OF DISCHARGE:  10/28/2005                                 OPERATIVE REPORT   PROCEDURE:  Esophagogastroduodenoscopy for staging of esophageal varices.   INDICATIONS:  [name] is a 50 year old Caucasian female with biopsy-proven  cirrhosis secondary to hepatitis C who received 1 year of antiviral therapy  and now is with sustained viral response.  However, it is felt her disease  has progressed.  She is being evaluated for OLT  via [facility]  at [place].  She is undergoing EGD for staging of her varices.  Prior exams of March 2005 and October 2005 revealed three columns which were  grade 1 to 2.  She has never bled from her varices.  Procedure risks were  reviewed with the patient, informed consent was obtained.   MEDS FOR CONSCIOUS SEDATION:  Benzocaine spray for pharyngeal topical  anesthesia, Demerol 50 mg IV, Versed 5 mg IV.   FINDINGS:  Procedure performed in endoscopy suite.  The patient's vital  signs and O2 sat were monitored during procedure and remained stable.  The  patient was placed left lateral position and Olympus videoscope was passed  oropharynx without any difficulty into esophagus.   Esophagus mucosa of the proximal segment was normal.  Proximally there were  two columns of esophageal varices starting at 24 cm from the incisors.  Distally there were three columns.  One column was felt to be a grade 2 but  columns at 2 o'clock and 7 o'clock were grade 3.  There were no red markings  noted.  At GE junction was at 36 cm from the incisors.   Stomach and small gastric pouch allowing retroflexion of scope without  fundal varices.  Patent gastrojejunostomy without  anastomotic ulceration.   Small bowel and jejunum was examined for several centimeters and reveals  normal mucosa and folds.  Endoscope was withdrawn.  The patient tolerated  the procedure well.   FINAL DIAGNOSIS:  Three columns of esophageal varices, two columns of grade  3.  Varices have progressed in size since last exam of October 2005.   Small gastric pouch with a patent gastrojejunostomy.  Please note she has  had surgery for weight reduction.   RECOMMENDATIONS:  We will start on Inderal 20 mg b.i.d. for primary  prophylaxis.   The patient will be scheduled for a bone density study as well.      Hildred Laser, M.D.  Electronically Signed     NR/MEDQ  D:  10/28/2005  T:  11/01/2005  Job:  [id number]   cc:   Sherrilee Gilles. Gerarda Fraction, MD  Fax: [phone]   Dr. Thurman Coyer  Hepatology Division, [facility] [place], [place]

## 2010-06-25 NOTE — Discharge Summary (Signed)
[facility]  Patient:    [name], [name] Visit Number: [id number] MRN: [id number]          Service Type: SUR Location: 4A A420 01 Attending Physician:  Florian Buff Dictated by:   Tania Ade, M.D. Admit Date:  03/15/2001 Discharge Date: 03/19/2001                             Discharge Summary  DISCHARGE DIAGNOSES: 1. Status post total abdominal hysterectomy with bilateral salpingo-    oophorectomy. 2. Lysis of adhesions with a partial omentectomy, extensive. 3. Panniculectomy. 4. Repair of umbilical hernia. 5. Obesity with chronic moisture changes. 6. Type 2 non-insulin dependent diabetes mellitus. 7. Hypertension. 8. Depression.  PROCEDURES:  Listed as above.  SURGEON:  Tania Ade, M.D.  ASSISTANT:  Mallory Shirk, M.D.  NOTE:  Please refer to the transcribed history and physical as well as the operative note for details of the admission to the hospital.  HOSPITAL COURSE:  Postoperatively the patient did quite well.  Her intraoperative course is noted in the operative note.  She had a lot of adhesions but surgery overall went well.  She had an appropriately drop in her hemoglobin and hematocrit on postoperative day #1, and subsequently postoperative day #3.  On postoperative day #1 it was 11.6 and 32.8 and on postoperative day #3 it was 10.8 and 30.  She remained afebrile throughout her postoperative course and her blood sugars were relatively stable.  She did require insulin on a few occasions.  Her blood pressure remained stable.  She became ambulatory on postoperative day 1 in the evening and that continued.  Her dressing was changed once and her incision was clean, dry and intact.  Her JP drains were putting out appropriately and the fluid went from bloody to serosanguineous by the time of discharge.  She had a bit of sluggish bowel function with flatus on postoperative day #2 in the evening and a bowel movement the morning of  postoperative day #4. She is tolerating her oral pain medications without difficulty and she has a Vivelle patch in place.  She is discharged to home on the morning of postoperative day #4 in good stable condition.  FOLLOWUP:  Follow up in the office on Friday at 2:30 for incision check.  She was given Tylox for pain and her vivelle patch prescription.  She is instructed to get Dulcolax suppositories and use as needed and as instructed. She is given instruction with precautions at home and will return or call as needed. Dictated by:   Tania Ade, M.D. Attending Physician:  Florian Buff DD:  03/19/01 TD:  03/19/01 Job: [id number]

## 2010-06-25 NOTE — Discharge Summary (Signed)
[name], [name]                  ACCOUNT NO.:  [id number]   MEDICAL RECORD NO.:  [id number]          PATIENT TYPE:  INP   LOCATION:  [id number]                          FACILITY:  [facility]   PHYSICIAN:  Leonides Grills, M.D.DATE OF BIRTH:  [birth year]   DATE OF ADMISSION:  12/09/2003  DATE OF DISCHARGE:  11/05/2005LH                                 DISCHARGE SUMMARY   DISCHARGE DIAGNOSES:  1.  Acute pyelonephritis.  2.  Ureteral stone.  3.  Chronic cirrhosis secondary to hepatitis C.  4.  Hypokalemia.  5.  Status post gastric bypass surgery.   HOSPITAL COURSE:  This 50 year old female was admitted with fever and aches,  and temperature of 103.  In the emergency room workup, she was noted to have  multiple white cells in her urine.  White count was normal at 6300.  Hemoglobin was also normal at 12.4.  She had a potassium of 3.1 on  admission, with supplementation this improved to 3.4 by the third hospital  day.  The patient's cultures of urine grew multiple species.  She was  treated empirically with Levaquin with defervescence of her fever within 24  hours.  The patient continued to have a headache thought to be associated  with her febrile illness.  She underwent a CT of the brain, which was  negative.  She also had a CT of the abdomen after complaining of flank pain.  CT of the abdomen and pelvis showed one right renal calculi which had  previously been noted in the past, and also previously noted left ureteral  calculus in the past, and one was seen floating in the bladder.  The patient  also was noted to have chronic ascites, which was known.  The patient also  had a chest x-ray,  which was negative.   The patient was tolerating her diet at the time of discharge, with no  complaint of headache, abdominal or flank pain.  She did complain of sore  throat and was found to have Monilial infection in her mouth.  She will be  discharged home on Levaquin, Mycelex troches and Diflucan.  The  patient will  follow up in the office in one week.     Will   WMM/MEDQ  D:  12/13/2003  T:  12/13/2003  Job:  [id number]

## 2010-06-25 NOTE — Op Note (Signed)
[facility]  Patient:    [name], [name] Visit Number: [id number] MRN: [id number]          Service Type: SUR Location: 4A A420 01 Attending Physician:  Florian Buff Dictated by:   Tania Ade, M.D. Admit Date:  03/15/2001                             Operative Report  DATE OF BIRTH:  11-24-1960  PREOPERATIVE DIAGNOSES:  1. Pelvic pain.  2. Menometrorrhagia completely unresponsive to all conservative measures.  3. Dysfunctional uterine bleeding, again unresponsive to all conservative     measures.  4. Dysmenorrhea.  5. Dyspareunia.  6. Obesity with chronic moisture changes.  7. Type 2 non-insulin-dependent diabetes.  8. Hypertension.  9. Depression.  POSTOPERATIVE DIAGNOSES:  1. Pelvic pain.  2. Menometrorrhagia completely unresponsive to all conservative measures.  3. Dysfunctional uterine bleeding, again unresponsive to all conservative     measures.  4. Dysmenorrhea.  5. Dyspareunia.  6. Obesity with chronic moisture changes.  7. Type 2 non-insulin-dependent diabetes.  8. Hypertension.  9. Depression. 10. Umbilical hernia. 11. Severe pelvic abdominal adhesive disease.  PROCEDURE:  1. Total abdominal hysterectomy with bilateral salpingo-oophorectomy.  2. Lysis of adhesions with a partial omentectomy, extensive.  3. Panniculectomy.  4. Repair of umbilical hernia.  SURGEON:  Tania Ade, M.D.  ASSISTANT:  Mallory Shirk, M.D.  ANESTHESIA:  General endotracheal.  FINDINGS:  Of course, patient had preoperatively known findings of chronic moisture beneath her pannus, chronic yeast infection there, and that was found at the time of surgery.  Intra-abdominally, she had severe adhesions.  Her omentum was completely adherent to her anterior abdominal wall and draped over her uterus and her sigmoid colon.  In order to facilitate entry into the abdomen, we had to do a partial omentectomy.  She also had a very wide fibrous band adhesion of  the uterus to the back of the bladder and the anterior abdominal wall.  The left adnexa was completely adherent to the left pelvic sidewall and to the side of the uterus.  The right adnexa had some filmy adhesions.  DESCRIPTION OF OPERATION:  Preoperatively, the patient had Unasyn given and had Flowtrons in place and working at the time of intubation.  She was placed in a supine position and underwent general endotracheal anesthesia.  She was prepped and draped in the usual sterile fashion after having been marked for panniculectomy; Dr. Glo Herring did that marking.  An incision was made with the knife on the superior portion -- or cranial portion -- of the incision and the Bovie was then used too for hemostasis and to incise the fat; we also used free ties for hemostasis.  This was carried down almost to the level of Scarpas fascia; we did not invade Scarpas to maintain lymphatic drainage of this area so she would not have postoperative wound edema.  The same was then performed in the lower marked incision and this was joined in both flanks. Hemostasis was again achieved with electrocautery as well as free ties.  Once this was performed, we handed off the pannus.  We then made a vertical incision through the remainder of the fat down to the fascia, which was incised in the midline, then the muscles were divided and a clear space in the peritoneum was found.  This was entered and the omentum was sitting right there densely adherent to the anterior  abdominal wall.  We tried for a little while to take it down sharply, to no avail, and I decided to perform a partial omentectomy using the LDS stapler and interrupted sutures; this was performed with good hemostasis.  All pedicles were reviewed and found to be hemostatic. I then had to work in the caudad portion of the incision.  The muscles were taken down farther to the pelvis, as was the fascia, but the uterus was densely adherent to the  bladder and anterior abdominal wall.  I then cross-clamped this fibrous band, making sure not to damage the bladder, and fore-and-aft sutures were placed through each side of the band.  We could then place a retractor with a bladder blade and the uterus was grasped on both sides with the curved Kellys.  The left ovary and tube were densely adherent to the pelvic sidewall and side of the uterus and this was taken down sharply, making sure to avoid ureteral embarrassment.  The upper abdomen was then packed away and the patient was placed in Trendelenburg position.  The left round ligament was suture-ligated and cut using electrocautery unit and the bladder flap was taken down sharply.  The infundibulopelvic ligament on the left was then exposed after the tube and ovary were freed up from adhesions. The infundibulopelvic ligament was then double clamped and then double suture-ligated with a free tie and a fore-and-aft suture.  The uterine vessels were skeletonized and a bladder flap taken down on the left.  The same procedure was carried out on the right.  The right round ligament was suture-ligated and cut with electrocautery unit.  The infundibulopelvic ligament was identified, a clear space was identified and it was double clamped, cut and double suture-ligated.  The uterine vessels on the right were skeletonized and a bladder flap was created on the right and pushed down off the lower uterine segment.  The uterine vessels on both sides were clamped, cut and transfixion suture-ligated.  Serial pedicles were then taken down the cervix to the cardinal ligament, each pedicle being clamped, cut and transfixion suture-ligated.  This was taken to the vaginal angle and the vagina was cross-clamped and great care was made throughout this to make sure the bladder was pushed out of our operative field.  The vagina was cross-clamped and the specimen removed.  Vaginal angle sutures were placed and then  interrupted figure-of-eight sutures were placed to finish the vaginal  closure.  Initially, we left a little cervix behind but this was excised sharply prior to vaginal closure.  There was good hemostasis of the pedicles. Vigorous irrigation was used and all pedicles found to be hemostatic.  We then went back up and evaluated all the pedicles of the omentum and a couple were oozing and free ties were then placed.  The patient also had an umbilical hernia and this was closed using 0 PDS in interrupted fashion.  These were tied down and there was good result of the defect.  All pedicles were once again viewed and found to be hemostatic.  The pelvis was irrigated vigorously once more.  The subcutaneous tissue, the fascia, muscles and peritoneum were closed in a modified Smead-Jones fashion using a looped 0 Novofil and this one was also taken down to the top and met in the middle, and again this was in a modified Smead-Jones fashion.  The subcutaneous tissue was irrigated.  It was reapproximated loosely using 2-0 plain gut to take up some of the dead space and  then this was done in two layers.  The skin edges were closed using skin staples.  JP drains were placed for continuous drainage on both sides postoperatively to ensure the seroma fluid being evacuated and good wound healing.  Patient, of course, is diabetic and great care was taken to make sure that the wound fascial incision was not too tightly closed.  The patient tolerated the procedure well.  She experienced 450 cc of blood loss.  She was taken to the recovery room in good and stable condition.  All counts were correct x3.  She received Unasyn prophylactically preoperatively, as I said.  She had 3800 cc of lactated Ringers and 250 cc of urine output during the procedure. Dictated by:   Tania Ade, M.D. Attending Physician:  Florian Buff DD:  03/15/01 TD:  03/16/01 Job: [id number]

## 2010-09-09 ENCOUNTER — Telehealth (INDEPENDENT_AMBULATORY_CARE_PROVIDER_SITE_OTHER): Payer: Self-pay | Admitting: *Deleted

## 2010-09-09 DIAGNOSIS — K7460 Unspecified cirrhosis of liver: Secondary | ICD-10-CM

## 2010-09-09 NOTE — Telephone Encounter (Signed)
Amy Roman from [employer] called, [name] is due for doppler u/s of liver. She is not scheduled to go back there until January and wants this done locally. Ms Roman said her last study showed a dilated extrahepatic bile duct so they ask special attention be paid to that area. If it remains dilated, THEY will plan MRCP when she sees them in January. If you have any questions Amy can be reached at [phone]. She asked that report be faxed to Gurney Maxin RN at [phone].  If this is ok with you, please place order for u/s under meds&orders for this telephone message & route back to me so I can call xray to schedule

## 2010-09-14 NOTE — Telephone Encounter (Signed)
U/S sch'd 09/21/10 @ 9:00 (8:30), npo after midnight, pt aware,  I asked radiology to forward a copy of report to Gurney Maxin RN @ [facility] but they cannot since they didn't order

## 2010-09-14 NOTE — Telephone Encounter (Signed)
Patient is being scheduled for screening ultrasound. Reason is that she has cirrhosis

## 2010-09-21 ENCOUNTER — Ambulatory Visit (HOSPITAL_COMMUNITY)
Admission: RE | Admit: 2010-09-21 | Discharge: 2010-09-21 | Disposition: A | Discharge status: Home / Self Care | Payer: BC Managed Care – PPO | Source: Ambulatory Visit | Attending: Internal Medicine | Admitting: Internal Medicine

## 2010-09-21 DIAGNOSIS — K7460 Unspecified cirrhosis of liver: Secondary | ICD-10-CM

## 2010-09-21 DIAGNOSIS — R188 Other ascites: Secondary | ICD-10-CM | POA: Insufficient documentation

## 2010-09-21 DIAGNOSIS — R161 Splenomegaly, not elsewhere classified: Secondary | ICD-10-CM | POA: Insufficient documentation

## 2010-11-01 LAB — BASIC METABOLIC PANEL
BUN: 14
Chloride: 102
Creatinine, Ser: 1.59 — ABNORMAL HIGH
Glucose, Bld: 174 — ABNORMAL HIGH
Potassium: 3.7

## 2010-11-01 LAB — CBC
HCT: 33.3 — ABNORMAL LOW
MCV: 85.6
Platelets: 58 — ABNORMAL LOW
RDW: 15.1

## 2010-11-01 LAB — STONE ANALYSIS

## 2010-11-01 LAB — DIFFERENTIAL
Basophils Absolute: 0
Basophils Relative: 0
Eosinophils Absolute: 0.1
Eosinophils Relative: 2

## 2010-11-16 LAB — URINALYSIS, ROUTINE W REFLEX MICROSCOPIC
Bilirubin Urine: NEGATIVE
Specific Gravity, Urine: 1.015
Urobilinogen, UA: 4 — ABNORMAL HIGH

## 2010-11-16 LAB — BASIC METABOLIC PANEL
BUN: 18
CO2: 28
Chloride: 102
Glucose, Bld: 184 — ABNORMAL HIGH
Potassium: 3.1 — ABNORMAL LOW

## 2010-11-16 LAB — DIFFERENTIAL
Eosinophils Absolute: 0.2
Eosinophils Absolute: 0.2
Eosinophils Relative: 4
Eosinophils Relative: 3
Lymphocytes Relative: 18
Lymphs Abs: 0.8
Lymphs Abs: 1.1
Monocytes Relative: 7

## 2010-11-16 LAB — WET PREP, GENITAL
Trich, Wet Prep: NONE SEEN
Yeast Wet Prep HPF POC: NONE SEEN

## 2010-11-16 LAB — CBC
HCT: 34.6 — ABNORMAL LOW
MCHC: 34.9
MCV: 86.7
Platelets: 73 — ABNORMAL LOW
Platelets: 102 — ABNORMAL LOW
RBC: 4.46
RDW: 14.6 — ABNORMAL HIGH
RDW: 15.3 — ABNORMAL HIGH
WBC: 4.3

## 2010-11-16 LAB — COMPREHENSIVE METABOLIC PANEL
ALT: 46 — ABNORMAL HIGH
AST: 41 — ABNORMAL HIGH
Calcium: 9.8
GFR calc Af Amer: 48 — ABNORMAL LOW
Sodium: 140
Total Protein: 7.7

## 2010-11-16 LAB — PROTIME-INR
INR: 1.3
INR: 1.3
Prothrombin Time: 17.1 — ABNORMAL HIGH
Prothrombin Time: 16.3 — ABNORMAL HIGH

## 2010-11-16 LAB — URINE MICROSCOPIC-ADD ON

## 2010-11-16 LAB — APTT: aPTT: 32

## 2010-11-18 LAB — URINALYSIS, ROUTINE W REFLEX MICROSCOPIC
Ketones, ur: NEGATIVE
Nitrite: NEGATIVE
Specific Gravity, Urine: 1.02
pH: 6

## 2010-11-18 LAB — URINE MICROSCOPIC-ADD ON

## 2011-02-17 ENCOUNTER — Telehealth (INDEPENDENT_AMBULATORY_CARE_PROVIDER_SITE_OTHER): Payer: Self-pay | Admitting: *Deleted

## 2011-02-17 DIAGNOSIS — K7460 Unspecified cirrhosis of liver: Secondary | ICD-10-CM

## 2011-02-17 DIAGNOSIS — K625 Hemorrhage of anus and rectum: Secondary | ICD-10-CM

## 2011-02-17 NOTE — Telephone Encounter (Addendum)
[name] calls and states that she has been having rectal bleeding for 3 weeks. This is noted with bowel movements and when not using the bathroom. Bowel Movements are normal,there have been no changes in the bowel movement,she is having some abdominal pain.Just in the last few days she has seen blood clots also. [name] says that she has lost weight during the holidays 6-7 pounds. The reason she has not contacted our office before now is that her son has been really sick.  I will review with Dr. Laural Golden then return call to patient. She may be reached at [phone]. Per Dr. Laural Golden the patient needs to get a CBC/D and call in Anusol [facility] Suppositories 1 per rectal at bedtime for 2 weeks. We need to also get her TCS reports from [place] [facility] done on 08/30/10. Lab noted and faxed, Prescription called to [place]

## 2011-02-21 NOTE — Telephone Encounter (Signed)
rec'd report from [facility], put on chart for Dr Olevia Perches review

## 2011-04-05 ENCOUNTER — Encounter (INDEPENDENT_AMBULATORY_CARE_PROVIDER_SITE_OTHER): Payer: Self-pay | Admitting: *Deleted

## 2011-04-14 HISTORY — PX: HERNIA REPAIR: SHX51

## 2011-05-02 ENCOUNTER — Ambulatory Visit (INDEPENDENT_AMBULATORY_CARE_PROVIDER_SITE_OTHER): Payer: BC Managed Care – PPO | Admitting: Internal Medicine

## 2011-05-02 ENCOUNTER — Encounter (INDEPENDENT_AMBULATORY_CARE_PROVIDER_SITE_OTHER): Payer: Self-pay | Admitting: Internal Medicine

## 2011-05-02 VITALS — BP 110/70 | HR 76 | Temp 97.6°F | Resp 20 | Ht 66.0 in | Wt 162.5 lb

## 2011-05-02 DIAGNOSIS — D649 Anemia, unspecified: Secondary | ICD-10-CM

## 2011-05-02 DIAGNOSIS — K7682 Hepatic encephalopathy: Secondary | ICD-10-CM | POA: Insufficient documentation

## 2011-05-02 DIAGNOSIS — K7460 Unspecified cirrhosis of liver: Secondary | ICD-10-CM | POA: Insufficient documentation

## 2011-05-02 DIAGNOSIS — R197 Diarrhea, unspecified: Secondary | ICD-10-CM

## 2011-05-02 DIAGNOSIS — S301XXA Contusion of abdominal wall, initial encounter: Secondary | ICD-10-CM | POA: Insufficient documentation

## 2011-05-02 DIAGNOSIS — D62 Acute posthemorrhagic anemia: Secondary | ICD-10-CM | POA: Insufficient documentation

## 2011-05-02 DIAGNOSIS — K7290 Hepatic failure, unspecified without coma: Secondary | ICD-10-CM | POA: Insufficient documentation

## 2011-05-02 LAB — BASIC METABOLIC PANEL
CO2: 27 mEq/L (ref 19–32)
Calcium: 8.8 mg/dL (ref 8.4–10.5)
Creat: 0.76 mg/dL (ref 0.50–1.10)
Glucose, Bld: 129 mg/dL — ABNORMAL HIGH (ref 70–99)

## 2011-05-02 LAB — AMMONIA: Ammonia: 29 umol/L (ref 11–60)

## 2011-05-02 LAB — CBC
Hemoglobin: 8.8 g/dL — ABNORMAL LOW (ref 12.0–15.0)
MCH: 25.1 pg — ABNORMAL LOW (ref 26.0–34.0)
MCHC: 31.4 g/dL (ref 30.0–36.0)
RDW: 16.3 % — ABNORMAL HIGH (ref 11.5–15.5)

## 2011-05-02 MED ORDER — OXYCODONE HCL 5 MG PO CAPS: 5.0000 mg | ORAL_CAPSULE | Freq: Three times a day (TID) | ORAL | Status: AC

## 2011-05-02 MED ORDER — ALIGN 4 MG PO CAPS: 4.0000 mg | ORAL_CAPSULE | Freq: Every day | ORAL | Status: DC

## 2011-05-02 NOTE — Patient Instructions (Addendum)
Take oxycodone 5 mg every 8 every 6 hours the next 2 or 3 days and then on an as-needed basis. Discontinue Augmentin. Align one capsule by mouth daily Stay on full liquids diet for 24 hours. Physician will contact you with results of blood work and stool test.

## 2011-05-02 NOTE — Progress Notes (Signed)
Presenting complaint;  Painful lump at left lower quadrant. Nausea vomiting and diarrhea. Patient is status post left inguinal herniorrhaphy on 04/14/2011. 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 in [place]. She had left inguinal herniorrhaphy on 04/14/2011 at [facility]. Postop she developed swelling around her scar and was very painful. She was readmitted to that facility on 04/25/2011 and had ultrasound followed by abdominopelvic CT. CT report is available for review. Within the left inguinal area she had filed by 8 x 7.5 cm fluid collection with tiny foci of air felt to be hematoma. CAT scan amount of ascites. Cirrhotic appearing liver with splenomegaly multiple right renal calculi suggestion of partial thrombosis of SMV. Patient was discharged on Percocet. She called me last night stating she was miserable. She could not keep any food down. She is also having nonbloody diarrhea and remained with intense pain and left lower quadrant groin area. She has not experienced fever. She denies dysuria rectal bleeding or melena.  Current Medications: Current Outpatient Prescriptions  Medication Sig Dispense Refill  . amoxicillin-clavulanate (AUGMENTIN) 875-125 MG per tablet Take 1 tablet by mouth 2 (two) times daily.      Marland Kitchen b complex vitamins tablet Take 1 tablet by mouth daily.      . furosemide (LASIX) 40 MG tablet Take 40 mg by mouth daily.      . Multiple Vitamin (MULTIVITAMIN) capsule Take 1 capsule by mouth daily.      Marland Kitchen NADOLOL PO Take 10 mg by mouth.      . oxyCODONE-acetaminophen (PERCOCET) 10-325 MG per tablet Take 1 tablet by mouth every 6 (six) hours as needed. Patient has been on this medication since recent surgery      . polyethylene glycol (MIRALAX / GLYCOLAX) packet Take 17 g by mouth as needed.      . rifaximin (XIFAXAN) 550  MG TABS Take 550 mg by mouth 2 (two) times daily.      Marland Kitchen SPIRONOLACTONE PO Take 100 mg by mouth 2 (two) times daily.      . traZODone (DESYREL) 50 MG tablet Take 50 mg by mouth at bedtime.      Marland Kitchen trimethoprim (TRIMPEX) 100 MG tablet Take 100 mg by mouth daily.         Objective: Blood pressure 110/70, pulse 76, temperature 97.6 F (36.4 C), temperature source Oral, resp. rate 20, height 5\' 6"  (1.676 m), weight 162 lb 8 oz (73.71 kg). Patient appears to be in distress. She does not have asterixis. Conjunctiva is somewhat pale.. Sclera is nonicteric Oropharyngeal mucosa is normal. No neck masses or thyromegaly noted. Cardiac exam with regular rhythm normal S1 and S2. No murmur or gallop noted. Lungs are clear to auscultation. Abdominal reveals large lump in left inguinal and lower quadrant region it is about 3 x 6" skin over it is normal. It is soft and tender. Rest of her abdomen is soft and enlarged spleen. Liver edge is indistinct.  No LE edema or clubbing noted.  Assessment:  [name] has following two issues. #1 postop left inguinal/LLQ hematoma resulting in pain. This should gradually resolve she just needs supportive therapy for pain control. Postop hematoma is not surprising given that she has mild coagulopathy and thrombocytopenia. #2. Nausea vomiting and diarrhea. Suspect the symptoms may be secondary to Augmentin or C. difficile colitis. Clinically she does not appear to be dehydrated  or toxic.   Plan:  Discontinue Percocet and Augmentin. Oxycodone 5 mg every 6 every 8 hours prescription given for 40 doses. She will go to the lab for CBC metabolic 7 serum ammonia and stool C. difficile toxin titer. Align one capsule by mouth daily for 4 weeks. Samples given. Patient advice to stay on a full liquid diet for 24 hours and call us if she had temp of greater than 101.

## 2011-05-07 ENCOUNTER — Encounter (INDEPENDENT_AMBULATORY_CARE_PROVIDER_SITE_OTHER): Payer: Self-pay | Admitting: Internal Medicine

## 2011-05-11 NOTE — Telephone Encounter (Signed)
This encounter was created in error - please disregard.

## 2011-05-23 ENCOUNTER — Telehealth (INDEPENDENT_AMBULATORY_CARE_PROVIDER_SITE_OTHER): Payer: Self-pay | Admitting: *Deleted

## 2011-05-23 NOTE — Telephone Encounter (Signed)
Patient called this morning stating that she is getting no better at all. After seeing [place] in March , she did return to [facility] for a follow up. She explained her problems and was told that she would return in 4 weeks and if no better they would consider going back in as they may have sutured in a nerve. [name] is very concerned as she does not want them to continue care related to this problem, she ask if [name] would please refer her to Dr. Marylen Ponto at [facility]. Per [name] make referral to Dr. Marylen Ponto at [facility]. Forwarded to Lelon Frohlich to arrange , I will contact Manie and let her know that [name] agreed to do this. She may be reached on her home phone [phone].

## 2011-05-23 NOTE — Telephone Encounter (Signed)
appt w/ Dr Toney Rakes 05/25/11 @ 830 (815), lmom advising Marcie Bal of appt, notes faxed

## 2011-06-06 ENCOUNTER — Telehealth (INDEPENDENT_AMBULATORY_CARE_PROVIDER_SITE_OTHER): Payer: Self-pay | Admitting: *Deleted

## 2011-06-06 DIAGNOSIS — K7460 Unspecified cirrhosis of liver: Secondary | ICD-10-CM

## 2011-06-06 DIAGNOSIS — D649 Anemia, unspecified: Secondary | ICD-10-CM

## 2011-06-06 NOTE — Telephone Encounter (Signed)
Patient called and states that she needs Pt/INR and PTT done

## 2011-06-07 LAB — PROTIME-INR: INR: 1.52 — ABNORMAL HIGH (ref <1.50)

## 2011-10-11 ENCOUNTER — Other Ambulatory Visit (INDEPENDENT_AMBULATORY_CARE_PROVIDER_SITE_OTHER): Payer: Self-pay | Admitting: *Deleted

## 2011-10-11 ENCOUNTER — Telehealth (INDEPENDENT_AMBULATORY_CARE_PROVIDER_SITE_OTHER): Payer: Self-pay | Admitting: *Deleted

## 2011-10-11 DIAGNOSIS — D649 Anemia, unspecified: Secondary | ICD-10-CM

## 2011-10-11 DIAGNOSIS — K922 Gastrointestinal hemorrhage, unspecified: Secondary | ICD-10-CM

## 2011-10-11 MED ORDER — PEG-KCL-NACL-NASULF-NA ASC-C 100 G PO SOLR: 1.0000 | Freq: Once | ORAL | Status: DC

## 2011-10-11 NOTE — Telephone Encounter (Signed)
PCP/Requesting MD: Dr Karn Cassis  Fax#: [phone]   Name & DOB: [name] 08/28/1960     Procedure: tcs  Reason/Indication:  Anemia, poss GI bleed Ok per Dr Laural Golden  Has patient had this procedure before?  Yes   If so, when, by whom and where?  2008  Is there a family history of colon cancer?  no  Who?  What age when diagnosed?    Is patient diabetic?   no      Does patient have prosthetic heart valve?  no  Do you have a pacemaker?  no  Has patient had joint replacement within last 12 months?  no  Is patient on Coumadin, Plavix and/or Aspirin? no  Medications: see EPIC  Allergies: ancef  Medication Adjustment:   Procedure date & time: 10/21/11 at 345

## 2011-10-11 NOTE — Telephone Encounter (Signed)
agree

## 2011-10-17 ENCOUNTER — Encounter (HOSPITAL_COMMUNITY): Payer: Self-pay | Admitting: Pharmacy Technician

## 2011-10-21 ENCOUNTER — Encounter (HOSPITAL_COMMUNITY)
Admission: RE | Disposition: A | Discharge status: Home / Self Care | Payer: Self-pay | Source: Ambulatory Visit | Attending: Internal Medicine

## 2011-10-21 ENCOUNTER — Encounter (HOSPITAL_COMMUNITY): Payer: Self-pay | Admitting: *Deleted

## 2011-10-21 ENCOUNTER — Ambulatory Visit (HOSPITAL_COMMUNITY)
Admission: RE | Admit: 2011-10-21 | Discharge: 2011-10-21 | Disposition: A | Discharge status: Home / Self Care | Payer: BC Managed Care – PPO | Source: Ambulatory Visit | Attending: Internal Medicine | Admitting: Internal Medicine

## 2011-10-21 DIAGNOSIS — D126 Benign neoplasm of colon, unspecified: Secondary | ICD-10-CM | POA: Insufficient documentation

## 2011-10-21 DIAGNOSIS — D649 Anemia, unspecified: Secondary | ICD-10-CM

## 2011-10-21 DIAGNOSIS — Z86010 Personal history of colonic polyps: Secondary | ICD-10-CM

## 2011-10-21 DIAGNOSIS — K644 Residual hemorrhoidal skin tags: Secondary | ICD-10-CM | POA: Insufficient documentation

## 2011-10-21 DIAGNOSIS — D509 Iron deficiency anemia, unspecified: Secondary | ICD-10-CM | POA: Insufficient documentation

## 2011-10-21 DIAGNOSIS — E119 Type 2 diabetes mellitus without complications: Secondary | ICD-10-CM | POA: Insufficient documentation

## 2011-10-21 DIAGNOSIS — K921 Melena: Secondary | ICD-10-CM

## 2011-10-21 DIAGNOSIS — K766 Portal hypertension: Secondary | ICD-10-CM

## 2011-10-21 DIAGNOSIS — K5730 Diverticulosis of large intestine without perforation or abscess without bleeding: Secondary | ICD-10-CM

## 2011-10-21 HISTORY — DX: Anemia, unspecified: D64.9

## 2011-10-21 HISTORY — PX: COLONOSCOPY: SHX5424

## 2011-10-21 SURGERY — COLONOSCOPY
Anesthesia: Moderate Sedation

## 2011-10-21 MED ORDER — MEPERIDINE HCL 50 MG/ML IJ SOLN
INTRAMUSCULAR | Status: AC
  Filled 2011-10-21: qty 1

## 2011-10-21 MED ORDER — STERILE WATER FOR IRRIGATION IR SOLN
Status: DC | PRN
  Administered 2011-10-21: 14:00:00

## 2011-10-21 MED ORDER — MIDAZOLAM HCL 5 MG/5ML IJ SOLN
INTRAMUSCULAR | Status: DC | PRN
  Administered 2011-10-21 (×2): 2 mg via INTRAVENOUS
  Administered 2011-10-21: 1 mg via INTRAVENOUS

## 2011-10-21 MED ORDER — SODIUM CHLORIDE 0.45 % IV SOLN
INTRAVENOUS | Status: DC
  Administered 2011-10-21: 1000 mL via INTRAVENOUS

## 2011-10-21 MED ORDER — MEPERIDINE HCL 50 MG/ML IJ SOLN
INTRAMUSCULAR | Status: DC | PRN
  Administered 2011-10-21 (×2): 20 mg via INTRAVENOUS
  Administered 2011-10-21: 10 mg via INTRAVENOUS

## 2011-10-21 MED ORDER — MIDAZOLAM HCL 5 MG/5ML IJ SOLN
INTRAMUSCULAR | Status: AC
  Filled 2011-10-21: qty 10

## 2011-10-21 NOTE — Op Note (Signed)
COLONOSCOPY PROCEDURE REPORT  PATIENT:  [name]  MR#:  [id number] Birthdate:  06-10-1960, 51 y.o., female Endoscopist:  Dr. Rogene Houston, MD Referred By:  Dr. Sherrilee Gilles. Fosco, MD Procedure Date: 10/21/2011  Procedure: Colonoscopy with snare polypectomy.  Indications:  Patient is 51 year old Caucasian female who has intermittent hematochezia anemia. She also has history of colonic adenoma which was removed on her last colonoscopy of February 2008.  Informed Consent:  The procedure and risks were reviewed with the patient and informed consent was obtained.  Medications:  Demerol 50 mg IV Versed 5  mg IV  Description of procedure:  After a digital rectal exam was performed, that colonoscope was advanced from the anus through the rectum and colon to the area of the cecum, ileocecal valve and appendiceal orifice. The cecum was deeply intubated. These structures were well-seen and photographed for the record. From the level of the cecum and ileocecal valve, the scope was slowly and cautiously withdrawn. The mucosal surfaces were carefully surveyed utilizing scope tip to flexion to facilitate fold flattening as needed. The scope was pulled down into the rectum where a thorough exam including retroflexion was performed.  Findings:   Prep satisfactory. Few small diverticula at sigmoid colon. 6 mm polyp snared from cecum. 6 mm polyp snared from ascending colon. Another small polyp was ablated via cold biopsy from ascending colon. These 3 polyps were submitted together. Small polyp ablated via cold biopsy from splenic flexure. Diffuse edema to colonic mucosa. Prominent hemorrhoids below the dentate line.  Therapeutic/Diagnostic Maneuvers Performed:  See  above  Complications:  None  Cecal Withdrawal Time:  7 minutes  Impression:  Examination performed to cecum. 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 4 small polyps removed; 2 were  snared and 2 ablated via cold biopsy as above )3 of these polyps are submitted together(cecum and ascending colon.ourth polyp from splenic flexure submitted separately) Mild sigmoid colon diverticulosis. Prominent external hemorrhoids felt to be source of intermittent hematochezia.  Recommendations:  Standard instructions given. No aspirin for 10 days. I will contact patient with results of biopsy.  Matthan Sledge U  10/21/2011 3:15 PM  CC: Dr. Glo Herring., MD & Dr. Rayne Du ref. provider found

## 2011-10-21 NOTE — H&P (Signed)
[name] is an 51 y.o. female.   Chief Complaint: Patient is here for colonoscopy. HPI: This 51 year old Caucasian female with intermittent hematochezia. She also has anemia which is felt to be not multifactorial. She takes lactulose on when necessary basis. She has undergone esophageal variceal banding at [facility] at [facility] for primary prophylaxis. Her last colonoscopy was in February 2008 with removal of single small tubular adenoma She has history of cirrhosis secondary to hep C which has been successfully treated. He finished treatment 6 years ago and remains with SVR.  Past Medical History  Diagnosis Date  . Esophageal varices   . Cirrhosis     non alcoholic  . Kidney stones   . Ascites   . Anemia     Past Surgical History  Procedure Date  . Abdominal hysterectomy   . Cholecystectomy   . Upper gastrointestinal endoscopy   . Colonoscopy   . Hernia   . Gastric bypass   . Cesarean section   . Hernia repair 04/14/11    Patient states that she hs had 4 total, with most recent  being 04/14/11.  . Lithotripsy   . Knee arthroscopy     Right knee    Family History  Problem Relation Age of Onset  . Dementia Mother   . Heart disease Father   . Liver disease Father   . Hypertension Father   . Diabetes Father   . Dementia Father   . Hypertension Brother    Social History:  reports that she quit smoking about 20 years ago. Her smoking use included Cigarettes. She has never used smokeless tobacco. She reports that she does not drink alcohol or use illicit drugs.  Allergies:  Allergies  Allergen Reactions  . Ancef (Cefazolin Sodium)     Blisters    Medications Prior to Admission  Medication Sig Dispense Refill  . b complex vitamins tablet Take 1 tablet by mouth daily.      . furosemide (LASIX) 40 MG tablet Take 40 mg by mouth 2 (two) times daily.       . Multiple Vitamin (MULTIVITAMIN) capsule Take 1 capsule by mouth daily.      . nadolol (CORGARD) 20 MG tablet  Take 10 mg by mouth daily.      . peg 3350 powder (MOVIPREP) 100 G SOLR Take 1 kit (100 g total) by mouth once.  1 kit  0  . polyethylene glycol (MIRALAX / GLYCOLAX) packet Take 17 g by mouth as needed. Constipation      . rifaximin (XIFAXAN) 550 MG TABS Take 550 mg by mouth 2 (two) times daily.      Marland Kitchen spironolactone (ALDACTONE) 100 MG tablet Take 300 mg by mouth daily.      . traZODone (DESYREL) 50 MG tablet Take 50 mg by mouth at bedtime.        No results found for this or any previous visit (from the past 48 hour(s)). No results found.  ROS  Blood pressure 119/78, pulse 73, temperature 98.3 F (36.8 C), temperature source Oral, resp. rate 18, height 5\' 6"  (1.676 m), weight 140 lb (63.504 kg), SpO2 98.00%. Physical Exam  Constitutional: She appears well-developed and well-nourished.  HENT:  Mouth/Throat: Oropharynx is clear and moist.  Eyes: Conjunctivae normal are normal. No scleral icterus.  Neck: No thyromegaly present.  GI: Soft. She exhibits no distension and no mass.       Easily palpable spleen; mild periumbilical tenderness.  Musculoskeletal: She exhibits no edema.  Lymphadenopathy:    She has no cervical adenopathy.  Neurological: She is alert.  Skin: Skin is warm and dry.     Assessment/Plan Intermittent hematochezia and anemia. History of small tubular adenoma removed in February 2008 Diagnostic and surveillance  colonoscopy.  Yonna Alwin U 10/21/2011, 2:13 PM

## 2011-10-26 ENCOUNTER — Encounter (HOSPITAL_COMMUNITY): Payer: Self-pay | Admitting: Internal Medicine

## 2011-10-31 ENCOUNTER — Encounter (INDEPENDENT_AMBULATORY_CARE_PROVIDER_SITE_OTHER): Payer: Self-pay | Admitting: *Deleted

## 2011-12-02 ENCOUNTER — Other Ambulatory Visit (INDEPENDENT_AMBULATORY_CARE_PROVIDER_SITE_OTHER): Payer: Self-pay | Admitting: Internal Medicine

## 2011-12-02 ENCOUNTER — Telehealth (INDEPENDENT_AMBULATORY_CARE_PROVIDER_SITE_OTHER): Payer: Self-pay | Admitting: *Deleted

## 2011-12-02 DIAGNOSIS — D509 Iron deficiency anemia, unspecified: Secondary | ICD-10-CM

## 2011-12-02 DIAGNOSIS — B1920 Unspecified viral hepatitis C without hepatic coma: Secondary | ICD-10-CM

## 2011-12-02 DIAGNOSIS — K7460 Unspecified cirrhosis of liver: Secondary | ICD-10-CM

## 2011-12-02 LAB — BASIC METABOLIC PANEL
BUN: 12 mg/dL (ref 6–23)
CO2: 29 mEq/L (ref 19–32)
Chloride: 103 mEq/L (ref 96–112)
Glucose, Bld: 117 mg/dL — ABNORMAL HIGH (ref 70–99)
Potassium: 4 mEq/L (ref 3.5–5.3)

## 2011-12-02 NOTE — Telephone Encounter (Signed)
This lab is being done per the request of Dr.Argo at [facility]

## 2011-12-05 ENCOUNTER — Encounter (HOSPITAL_COMMUNITY): Payer: BC Managed Care – PPO | Attending: Internal Medicine

## 2011-12-05 DIAGNOSIS — D509 Iron deficiency anemia, unspecified: Secondary | ICD-10-CM

## 2011-12-05 DIAGNOSIS — D649 Anemia, unspecified: Secondary | ICD-10-CM

## 2011-12-05 MED ORDER — FERUMOXYTOL INJECTION 510 MG/17 ML
510.0000 mg | Freq: Once | INTRAVENOUS | Status: AC
  Administered 2011-12-05: 510 mg via INTRAVENOUS
  Filled 2011-12-05: qty 17

## 2011-12-05 MED ORDER — SODIUM CHLORIDE 0.9 % IV SOLN
INTRAVENOUS | Status: DC
  Administered 2011-12-05: 12:00:00 via INTRAVENOUS

## 2011-12-05 MED ORDER — SODIUM CHLORIDE 0.9 % IJ SOLN
10.0000 mL | INTRAMUSCULAR | Status: DC | PRN
  Administered 2011-12-05: 10 mL via INTRAVENOUS
  Filled 2011-12-05: qty 10

## 2011-12-05 NOTE — Progress Notes (Signed)
Tolerated well

## 2011-12-06 ENCOUNTER — Inpatient Hospital Stay (HOSPITAL_COMMUNITY)
Admission: EM | Admit: 2011-12-06 | Discharge: 2011-12-10 | DRG: 569 | Disposition: A | Discharge status: Other Acute Inpatient Hospital | Payer: BC Managed Care – PPO | Attending: Internal Medicine | Admitting: Internal Medicine

## 2011-12-06 ENCOUNTER — Encounter (HOSPITAL_COMMUNITY): Payer: Self-pay | Admitting: *Deleted

## 2011-12-06 ENCOUNTER — Emergency Department (HOSPITAL_COMMUNITY): Payer: BC Managed Care – PPO

## 2011-12-06 ENCOUNTER — Ambulatory Visit (HOSPITAL_COMMUNITY)
Admission: RE | Admit: 2011-12-06 | Discharge: 2011-12-06 | Disposition: A | Discharge status: Home / Self Care | Payer: BC Managed Care – PPO | Source: Ambulatory Visit | Attending: Internal Medicine | Admitting: Internal Medicine

## 2011-12-06 DIAGNOSIS — D696 Thrombocytopenia, unspecified: Secondary | ICD-10-CM

## 2011-12-06 DIAGNOSIS — Z96659 Presence of unspecified artificial knee joint: Secondary | ICD-10-CM

## 2011-12-06 DIAGNOSIS — Z87891 Personal history of nicotine dependence: Secondary | ICD-10-CM

## 2011-12-06 DIAGNOSIS — N201 Calculus of ureter: Secondary | ICD-10-CM

## 2011-12-06 DIAGNOSIS — Z9089 Acquired absence of other organs: Secondary | ICD-10-CM

## 2011-12-06 DIAGNOSIS — R161 Splenomegaly, not elsewhere classified: Secondary | ICD-10-CM | POA: Insufficient documentation

## 2011-12-06 DIAGNOSIS — Z9884 Bariatric surgery status: Secondary | ICD-10-CM

## 2011-12-06 DIAGNOSIS — K7460 Unspecified cirrhosis of liver: Secondary | ICD-10-CM | POA: Insufficient documentation

## 2011-12-06 DIAGNOSIS — B1920 Unspecified viral hepatitis C without hepatic coma: Secondary | ICD-10-CM

## 2011-12-06 DIAGNOSIS — D509 Iron deficiency anemia, unspecified: Secondary | ICD-10-CM | POA: Diagnosis present

## 2011-12-06 DIAGNOSIS — IMO0002 Reserved for concepts with insufficient information to code with codable children: Secondary | ICD-10-CM | POA: Diagnosis present

## 2011-12-06 DIAGNOSIS — N1330 Unspecified hydronephrosis: Secondary | ICD-10-CM

## 2011-12-06 DIAGNOSIS — D62 Acute posthemorrhagic anemia: Secondary | ICD-10-CM | POA: Diagnosis present

## 2011-12-06 DIAGNOSIS — D649 Anemia, unspecified: Secondary | ICD-10-CM

## 2011-12-06 DIAGNOSIS — Z79899 Other long term (current) drug therapy: Secondary | ICD-10-CM

## 2011-12-06 DIAGNOSIS — B182 Chronic viral hepatitis C: Secondary | ICD-10-CM | POA: Diagnosis present

## 2011-12-06 DIAGNOSIS — Z90710 Acquired absence of both cervix and uterus: Secondary | ICD-10-CM

## 2011-12-06 DIAGNOSIS — Z87442 Personal history of urinary calculi: Secondary | ICD-10-CM

## 2011-12-06 DIAGNOSIS — R509 Fever, unspecified: Secondary | ICD-10-CM | POA: Insufficient documentation

## 2011-12-06 DIAGNOSIS — Z881 Allergy status to other antibiotic agents status: Secondary | ICD-10-CM

## 2011-12-06 DIAGNOSIS — I8510 Secondary esophageal varices without bleeding: Secondary | ICD-10-CM | POA: Diagnosis present

## 2011-12-06 DIAGNOSIS — R7881 Bacteremia: Secondary | ICD-10-CM

## 2011-12-06 DIAGNOSIS — I959 Hypotension, unspecified: Secondary | ICD-10-CM | POA: Diagnosis present

## 2011-12-06 DIAGNOSIS — N12 Tubulo-interstitial nephritis, not specified as acute or chronic: Principal | ICD-10-CM

## 2011-12-06 DIAGNOSIS — B961 Klebsiella pneumoniae [K. pneumoniae] as the cause of diseases classified elsewhere: Secondary | ICD-10-CM | POA: Diagnosis present

## 2011-12-06 DIAGNOSIS — R188 Other ascites: Secondary | ICD-10-CM | POA: Diagnosis present

## 2011-12-06 DIAGNOSIS — D689 Coagulation defect, unspecified: Secondary | ICD-10-CM | POA: Diagnosis present

## 2011-12-06 LAB — CBC WITH DIFFERENTIAL/PLATELET
Basophils Relative: 1 % (ref 0–1)
Eosinophils Absolute: 0 10*3/uL (ref 0.0–0.7)
Eosinophils Relative: 1 % (ref 0–5)
HCT: 25.9 % — ABNORMAL LOW (ref 36.0–46.0)
Hemoglobin: 8 g/dL — ABNORMAL LOW (ref 12.0–15.0)
MCH: 22.7 pg — ABNORMAL LOW (ref 26.0–34.0)
MCHC: 30.9 g/dL (ref 30.0–36.0)
MCV: 73.6 fL — ABNORMAL LOW (ref 78.0–100.0)
Monocytes Absolute: 0.3 10*3/uL (ref 0.1–1.0)
Monocytes Relative: 5 % (ref 3–12)

## 2011-12-06 LAB — LIPASE, BLOOD: Lipase: 75 U/L — ABNORMAL HIGH (ref 11–59)

## 2011-12-06 LAB — COMPREHENSIVE METABOLIC PANEL
Albumin: 3 g/dL — ABNORMAL LOW (ref 3.5–5.2)
BUN: 14 mg/dL (ref 6–23)
Creatinine, Ser: 0.91 mg/dL (ref 0.50–1.10)
GFR calc Af Amer: 84 mL/min — ABNORMAL LOW (ref >90)
Glucose, Bld: 176 mg/dL — ABNORMAL HIGH (ref 70–99)
Total Protein: 6.6 g/dL (ref 6.0–8.3)

## 2011-12-06 LAB — PROTIME-INR: Prothrombin Time: 19.2 seconds — ABNORMAL HIGH (ref 11.6–15.2)

## 2011-12-06 LAB — AMMONIA: Ammonia: 54 umol/L (ref 11–60)

## 2011-12-06 MED ORDER — SODIUM CHLORIDE 0.9 % IV SOLN
INTRAVENOUS | Status: DC
  Administered 2011-12-06 – 2011-12-08 (×2): via INTRAVENOUS
  Administered 2011-12-09: 10 mL/h via INTRAVENOUS

## 2011-12-06 MED ORDER — IOHEXOL 300 MG/ML  SOLN
100.0000 mL | Freq: Once | INTRAMUSCULAR | Status: AC | PRN
  Administered 2011-12-06: 100 mL via INTRAVENOUS

## 2011-12-06 NOTE — ED Notes (Addendum)
Pt is onliver ltransplant list, told to come to ER due to fever.nausea, no vomiting,  Back pain.RuQ pain

## 2011-12-06 NOTE — ED Notes (Signed)
Patient refused cath at this time, understands that we need urine specimen and states that she will try to give [place] one when she can.

## 2011-12-07 DIAGNOSIS — N1330 Unspecified hydronephrosis: Secondary | ICD-10-CM | POA: Diagnosis present

## 2011-12-07 DIAGNOSIS — R509 Fever, unspecified: Secondary | ICD-10-CM

## 2011-12-07 DIAGNOSIS — D696 Thrombocytopenia, unspecified: Secondary | ICD-10-CM | POA: Diagnosis present

## 2011-12-07 DIAGNOSIS — N201 Calculus of ureter: Secondary | ICD-10-CM | POA: Diagnosis present

## 2011-12-07 DIAGNOSIS — K7460 Unspecified cirrhosis of liver: Secondary | ICD-10-CM

## 2011-12-07 DIAGNOSIS — D509 Iron deficiency anemia, unspecified: Secondary | ICD-10-CM

## 2011-12-07 DIAGNOSIS — N12 Tubulo-interstitial nephritis, not specified as acute or chronic: Secondary | ICD-10-CM | POA: Diagnosis present

## 2011-12-07 HISTORY — DX: Calculus of ureter: N20.1

## 2011-12-07 LAB — URINALYSIS, ROUTINE W REFLEX MICROSCOPIC
Bilirubin Urine: NEGATIVE
Nitrite: NEGATIVE
Protein, ur: NEGATIVE mg/dL
Specific Gravity, Urine: <1.005 — ABNORMAL LOW (ref 1.005–1.030)
Urobilinogen, UA: 0.2 mg/dL (ref 0.0–1.0)

## 2011-12-07 LAB — URINE MICROSCOPIC-ADD ON

## 2011-12-07 MED ORDER — RIFAXIMIN 550 MG PO TABS
550.0000 mg | ORAL_TABLET | Freq: Two times a day (BID) | ORAL | Status: DC
  Administered 2011-12-07 – 2011-12-10 (×7): 550 mg via ORAL
  Filled 2011-12-07 (×11): qty 1

## 2011-12-07 MED ORDER — NADOLOL 20 MG PO TABS
10.0000 mg | ORAL_TABLET | Freq: Every day | ORAL | Status: DC
  Administered 2011-12-07 – 2011-12-10 (×3): 10 mg via ORAL
  Filled 2011-12-07 (×6): qty 1

## 2011-12-07 MED ORDER — LEVOFLOXACIN IN D5W 500 MG/100ML IV SOLN
INTRAVENOUS | Status: AC
  Filled 2011-12-07: qty 100

## 2011-12-07 MED ORDER — LEVOFLOXACIN IN D5W 500 MG/100ML IV SOLN
500.0000 mg | INTRAVENOUS | Status: DC
  Administered 2011-12-07: 500 mg via INTRAVENOUS
  Filled 2011-12-07: qty 100

## 2011-12-07 MED ORDER — TRAZODONE HCL 50 MG PO TABS
50.0000 mg | ORAL_TABLET | Freq: Every day | ORAL | Status: DC
  Administered 2011-12-07 – 2011-12-09 (×4): 50 mg via ORAL
  Filled 2011-12-07 (×4): qty 1

## 2011-12-07 MED ORDER — HYDROMORPHONE HCL PF 1 MG/ML IJ SOLN
0.5000 mg | INTRAMUSCULAR | Status: AC | PRN
  Administered 2011-12-07: 0.5 mg via INTRAVENOUS
  Filled 2011-12-07: qty 1

## 2011-12-07 MED ORDER — OXYCODONE HCL 5 MG PO TABS
5.0000 mg | ORAL_TABLET | ORAL | Status: DC | PRN
  Administered 2011-12-07 – 2011-12-08 (×6): 5 mg via ORAL
  Filled 2011-12-07 (×7): qty 1

## 2011-12-07 MED ORDER — ALUM & MAG HYDROXIDE-SIMETH 200-200-20 MG/5ML PO SUSP: 30.0000 mL | Freq: Four times a day (QID) | ORAL | Status: DC | PRN

## 2011-12-07 MED ORDER — SODIUM CHLORIDE 0.9 % IV SOLN
INTRAVENOUS | Status: DC
  Administered 2011-12-07: 02:00:00 via INTRAVENOUS

## 2011-12-07 MED ORDER — ONDANSETRON HCL 4 MG/2ML IJ SOLN
4.0000 mg | Freq: Three times a day (TID) | INTRAMUSCULAR | Status: AC | PRN
  Administered 2011-12-07 (×2): 4 mg via INTRAVENOUS
  Filled 2011-12-07 (×2): qty 2

## 2011-12-07 MED ORDER — PANTOPRAZOLE SODIUM 40 MG PO TBEC
40.0000 mg | DELAYED_RELEASE_TABLET | Freq: Every day | ORAL | Status: DC
  Administered 2011-12-07 – 2011-12-10 (×4): 40 mg via ORAL
  Filled 2011-12-07 (×4): qty 1

## 2011-12-07 MED ORDER — SODIUM CHLORIDE 0.9 % IV SOLN
500.0000 mg | Freq: Three times a day (TID) | INTRAVENOUS | Status: DC
  Administered 2011-12-07 – 2011-12-09 (×6): 500 mg via INTRAVENOUS
  Filled 2011-12-07 (×7): qty 500

## 2011-12-07 NOTE — ED Notes (Signed)
Report given to [place], rn on 3rd floor

## 2011-12-07 NOTE — Progress Notes (Signed)
Patient is well known to me. 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 She had ultrasound yesterday as recommended by her hepatologist at [facility] which revealed right hydronephrosis. Her transplant coordinator was contacted by me earlier today patient could not be transferred because of lack of bed availability. I agree with with Dr. Jenne Campus assessment that of obstructive uropathy needs to be addressed as soon as possible and Dr. Michela Pitcher has seen the patient. Patient has iron deficiency anemia and was given 510 mg of ferrite heme last week as recommended by her hepatologist Dr. Durward Parcel of [facility]. She had colonoscopy by me 6 weeks ago revealing few small polyps one of which was tubular adenoma and she also had AV malformations and portal colopathy. She has undergone esophageal variceal banding for primary prophylaxis at [facility]. Suspect her iron deficiency anemia may be due to occult bleed or impaired iron absorption. Please call for GI issues.

## 2011-12-07 NOTE — Progress Notes (Addendum)
Chart reviewed. Old records reviewed. Patient interviewed and examined. Discussed with Drs. Golding and [place]. Subjective: Feels a little better. Some nausea. No vomiting. Pain improved.    Filed Vitals:   12/06/11 2018 12/07/11 0108 12/07/11 0215 12/07/11 0642  BP: 132/99 115/63 120/64 95/58  Pulse: 103 97 91 93  Temp: 99.7 F (37.6 C) 101 F (38.3 C) 101 F (38.3 C) 100.5 F (38.1 C)  TempSrc: Oral Oral Oral Oral  Resp:  19 18 19   Height: 5\' 6"  (1.676 m)     Weight: 65.772 kg (145 lb)  71.033 kg (156 lb 9.6 oz) 72.077 kg (158 lb 14.4 oz)  SpO2: 99% 98% 97% 100%   General: Comfortable. Nontoxic. Alert and oriented. Remembers me from previous hospitalization HEENT: Sclera nonicteric. Dry mucous membranes. Lungs: Clear to auscultation bilaterally without wheeze rhonchi or rales Cardiovascular regular rate rhythm with 3/6 systolic murmur Abdomen: Protuberant, soft, nontender. Fluid wave present Extremities minimal edema   Patient's ureteral stone is likely too large to pass spontaneously. She will likely need an intervention. She is requesting transfer to [facility], where she is known to the transplant team. This is appropriate. In the past, patient's had recurrent urinary tract infections, nephrolithiasis and bacteremia. Organisms have been resistant to fluoroquinolones and sulfa. She reports an allergy to Ancef. We'll place her on Primaxin for now. Dr. Melony Overly will contact the transplant team at [facility].  decrease IV fluids to [facility]. Start a diet.  Addendum: Discussed with Dr. Melony Overly, and subsequently Dr. Frederick Peers, hospitalist at [facility]. They have no beds there currently, but will advise if one becomes available. We will go ahead and consult Dr. Michela Pitcher in case a bed has not become available and intervention required sooner. Updated patient who is agreeable with plan.

## 2011-12-07 NOTE — ED Notes (Signed)
Patient tearful at this time, states that she is just frustrated that it has took so long, explained to patient that we had been waiting on a bed assignment for her. Patient states that she only came here to be stabilized and then sent to [facility] later where all her primary care physician's are. Patient repositioned and informed that as soon as I get a bed assignment i would let her know.

## 2011-12-07 NOTE — ED Provider Notes (Signed)
History     CSN: [id number]  Arrival date & time 12/06/11  1956   First MD Initiated Contact with Patient 12/06/11 2026      Chief Complaint  Patient presents with  . Fever     HPI Pt was seen at 2105.  Per pt, c/o gradual onset and persistence of constant home fevers to "102.8" that began today. Has been associated with right sided back "pain" and nausea.  Pt states she was told by her GI MD to come to the ED for further eval and admission. Denies rash, no CP/SOB, no vomiting/diarrhea, no abd pain.     GI:  Dr. Laural Golden Past Medical History  Diagnosis Date  . Esophageal varices   . Cirrhosis     non alcoholic  . Ascites   . Anemia   . Kidney stones     Past Surgical History  Procedure Date  . Abdominal hysterectomy   . Cholecystectomy   . Upper gastrointestinal endoscopy   . Colonoscopy   . Hernia   . Gastric bypass   . Cesarean section   . Hernia repair 04/14/11    Patient states that she hs had 4 total, with most recent  being 04/14/11.  . Lithotripsy   . Knee arthroscopy     Right knee  . Colonoscopy 10/21/2011    Procedure: COLONOSCOPY;  Surgeon: Rogene Houston, MD;  Location: [facility];  Service: Endoscopy;  Laterality: N/A;  245     Family History  Problem Relation Age of Onset  . Dementia Mother   . Heart disease Father   . Liver disease Father   . Hypertension Father   . Diabetes Father   . Dementia Father   . Hypertension Brother     History  Substance Use Topics  . Smoking status: Former Smoker    Types: Cigarettes    Quit date: 05/02/1991  . Smokeless tobacco: Never Used  . Alcohol Use: No      Review of Systems ROS: Statement: All systems negative except as marked or noted in the HPI; Constitutional: +fever and chills. ; ; Eyes: Negative for eye pain, redness and discharge. ; ; ENMT: Negative for ear pain, hoarseness, nasal congestion, sinus pressure and sore throat. ; ; Cardiovascular: Negative for chest pain, palpitations,  diaphoresis, dyspnea and peripheral edema. ; ; Respiratory: Negative for cough, wheezing and stridor. ; ; Gastrointestinal: +nausea. Negative for vomiting, diarrhea, abdominal pain, blood in stool, hematemesis, jaundice and rectal bleeding. . ; ; Genitourinary: +right back pain. Negative for dysuria and hematuria. ; ; Musculoskeletal: Negative fors neck pain. Negative for swelling and trauma.; ; Skin: Negative for pruritus, rash, abrasions, blisters, bruising and skin lesion.; ; Neuro: Negative for headache, lightheadedness and neck stiffness. Negative for weakness, altered level of consciousness , altered mental status, extremity weakness, paresthesias, involuntary movement, seizure and syncope.     Allergies  Ancef  Home Medications   Current Outpatient Rx  Name Route Sig Dispense Refill  . B COMPLEX PO TABS Oral Take 1 tablet by mouth daily.    Marland Kitchen DOXYCYCLINE HYCLATE 100 MG PO TABS Oral Take 100 mg by mouth Twice daily. For 10 days    . FUROSEMIDE 40 MG PO TABS Oral Take 40 mg by mouth daily.     . MULTIVITAMINS PO CAPS Oral Take 1 capsule by mouth daily.    Marland Kitchen NADOLOL 20 MG PO TABS Oral Take 10 mg by mouth daily.    Marland Kitchen OMEPRAZOLE 20  MG PO CPDR Oral Take 20 mg by mouth Twice daily.    Marland Kitchen RIFAXIMIN 550 MG PO TABS Oral Take 550 mg by mouth 2 (two) times daily.    Marland Kitchen SPIRONOLACTONE 100 MG PO TABS Oral Take 300 mg by mouth daily.    . TRAZODONE HCL 50 MG PO TABS Oral Take 50 mg by mouth at bedtime.    Marland Kitchen POLYETHYLENE GLYCOL 3350 PO PACK Oral Take 17 g by mouth daily as needed. Constipation      BP 132/99  Pulse 103  Temp 99.7 F (37.6 C) (Oral)  Ht 5\' 6"  (1.676 m)  Wt 145 lb (65.772 kg)  BMI 23.40 kg/m2  SpO2 99%  Physical Exam 2110: Physical examination:  Nursing notes reviewed; Vital signs and O2 SAT reviewed;  Constitutional: Well developed, Well nourished, Well hydrated, In no acute distress; Head:  Normocephalic, atraumatic; Eyes: EOMI, PERRL, No scleral icterus; ENMT: Mouth and  pharynx normal, Mucous membranes moist; Neck: Supple, Full range of motion, No lymphadenopathy; Cardiovascular: Regular rate and rhythm, No gallop; Respiratory: Breath sounds clear & equal bilaterally, No wheezes.  Speaking full sentences with ease, Normal respiratory effort/excursion; Chest: Nontender, Movement normal; Abdomen: Soft, protuberant, nontender, Nondistended, Normal bowel sounds; Genitourinary: No CVA tenderness; Extremities: Pulses normal, No tenderness, No edema, No calf edema or asymmetry.; Neuro: AA&Ox3, Major CN grossly intact.  Speech clear. No gross focal motor or sensory deficits in extremities.; Skin: Color normal, Warm, Dry.   ED Course  Procedures  1900: Received T/C from GI Dr. Laural Golden: states he is sending pt to the ED for admission further workup and admission; pt has hx chronic hepatitis C and is on transplant list at [facility]; pt had outpt [place] today with +right hydronephrosis; pt called him today and c/o right flank pain and home fevers to "102;" states pt has hx of kidney stones and DDx includes this as well as possible pyelonephritis; he requests to admit pt to hospitalist service here at [facility] and he will see her tomorrow to assist with transfer to [facility].    MDM  MDM Reviewed: nursing note, previous chart and vitals Reviewed previous: labs and ultrasound Interpretation: labs, x-ray and CT scan   Results for orders placed during the hospital encounter of 12/06/11  PROTIME-INR      Component Value Range   Prothrombin Time 19.2 (*) 11.6 - 15.2 seconds   INR 1.68 (*) 0.00 - 1.49  CBC WITH DIFFERENTIAL      Component Value Range   WBC 5.1  4.0 - 10.5 K/uL   RBC 3.52 (*) 3.87 - 5.11 MIL/uL   Hemoglobin 8.0 (*) 12.0 - 15.0 g/dL   HCT 25.9 (*) 36.0 - 46.0 %   MCV 73.6 (*) 78.0 - 100.0 fL   MCH 22.7 (*) 26.0 - 34.0 pg   MCHC 30.9  30.0 - 36.0 g/dL   RDW 17.6 (*) 11.5 - 15.5 %   Platelets 59 (*) 150 - 400 K/uL   Neutrophils Relative 85 (*) 43 - 77 %   Neutro Abs 4.3  1.7 -  7.7 K/uL   Lymphocytes Relative 8 (*) 12 - 46 %   Lymphs Abs 0.4 (*) 0.7 - 4.0 K/uL   Monocytes Relative 5  3 - 12 %   Monocytes Absolute 0.3  0.1 - 1.0 K/uL   Eosinophils Relative 1  0 - 5 %   Eosinophils Absolute 0.0  0.0 - 0.7 K/uL   Basophils Relative 1  0 - 1 %  Basophils Absolute 0.0  0.0 - 0.1 K/uL  COMPREHENSIVE METABOLIC PANEL      Component Value Range   Sodium 133 (*) 135 - 145 mEq/L   Potassium 3.5  3.5 - 5.1 mEq/L   Chloride 100  96 - 112 mEq/L   CO2 24  19 - 32 mEq/L   Glucose, Bld 176 (*) 70 - 99 mg/dL   BUN 14  6 - 23 mg/dL   Creatinine, Ser 0.91  0.50 - 1.10 mg/dL   Calcium 8.6  8.4 - 10.5 mg/dL   Total Protein 6.6  6.0 - 8.3 g/dL   Albumin 3.0 (*) 3.5 - 5.2 g/dL   AST 46 (*) 0 - 37 U/L   ALT 39 (*) 0 - 35 U/L   Alkaline Phosphatase 105  39 - 117 U/L   Total Bilirubin 0.7  0.3 - 1.2 mg/dL   GFR calc non Af Amer 72 (*) >90 mL/min   GFR calc Af Amer 84 (*) >90 mL/min  AMMONIA      Component Value Range   Ammonia 54  11 - 60 umol/L  LIPASE, BLOOD      Component Value Range   Lipase 75 (*) 11 - 59 U/L  CULTURE, BLOOD (ROUTINE X 2)      Component Value Range   Specimen Description Blood LEFT ARM     Special Requests BOTTLES DRAWN AEROBIC AND ANAEROBIC 10 CC EACH     Culture PENDING     Report Status PENDING    CULTURE, BLOOD (ROUTINE X 2)      Component Value Range   Specimen Description Blood LEFT HAND     Special Requests BOTTLES DRAWN AEROBIC AND ANAEROBIC 8 CC EACH     Culture PENDING     Report Status PENDING     Dg Chest 2 View 12/06/2011  *RADIOLOGY REPORT*  Clinical Data: Nausea and vomiting.  History of cirrhosis.  CHEST - 2 VIEW  Comparison: PA and lateral chest 12/19/2009.  Findings: Lungs are clear.  Heart size is normal.  No pneumothorax or pleural fluid.  No focal bony abnormality.  IMPRESSION: No acute disease.   Original Report Authenticated By: Arvid Right. Luther Parody, M.D.    US Abdomen Complete 12/06/2011  *RADIOLOGY REPORT*  Clinical  Data:  Cirrhosis, fever, history of hepatitis C  COMPLETE ABDOMINAL ULTRASOUND  Comparison:  Ultrasound the abdomen of 09/21/2010  Findings:  Gallbladder:  The gallbladder has previously been removed.  Common bile duct:  The common bile duct measures 7.4 mm in diameter most likely normal post cholecystectomy.  Liver:  The liver is echogenic and inhomogeneous consistent with fatty infiltration and probable cirrhosis.  There does appear to be thrombosis of the portal vein with varices.  IVC:  Appears normal.  Pancreas:  The pancreas is obscured by bowel gas.  Spleen:  There is splenomegaly present with the spleen measuring 19.2 cm sagittally, with a volume of 2486 ml.  A rounded isoechoic structure medial to the spleen may represent a splenule.  Right Kidney:  There is marked right hydronephrosis present. Echogenic foci are noted within the hydronephrotic kidney which may represent calculi versus possible air bubbles CT of the abdomen pelvis may be helpful to evaluate more sensitively.  Echogenic foci are noted within the right kidney which may represent calculi.  The right kidney measures 11.7 cm sagittally.  Left Kidney:  No left hydronephrosis is seen.  The left kidney measures 11.0 cm.  Multiple echogenic foci are again  are noted which may represent calculi or calcifications possibly renovascular.  Abdominal aorta:  The abdominal aorta is normal in caliber with atheromatous plaque present.  A small amount of ascites is noted.  IMPRESSION:  1.  Marked right hydronephrosis.  Consider CT of the abdomen pelvis to assess further. 2.  Significant splenomegaly. 3.  Changes of cirrhosis of the liver with apparent portal vein thrombosis and varices. 4.  Small amount of ascites.   Original Report Authenticated By: Joretta Bachelor, M.D.    Ct Abdomen Pelvis W Contrast 12/06/2011  *RADIOLOGY REPORT*  Clinical Data: Normal pain.  Abnormal ultrasound demonstrating hydronephrosis.  CT ABDOMEN AND PELVIS WITH CONTRAST   Technique:  Multidetector CT imaging of the abdomen and pelvis was performed following the standard protocol during bolus administration of intravenous contrast.  Contrast: 143mL OMNIPAQUE IOHEXOL 300 MG/ML  SOLN  Comparison: CT abdomen and pelvis 12/20/2009 and abdominal ultrasound earlier this same date.  Findings: The lung bases are clear. No pleural or pericardial effusion.  The liver is shrunken with a nodular border consistent with cirrhosis.  The spleen measures 16.2 cm, not markedly changed. 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 There appears to be portal vein thrombosis with cavernous transformation.  As seen on ultrasound, there is marked right hydronephrosis due to a 1.3 cm proximal right ureteral stone.  Multiple additional nonobstructing right renal stones identified.  The largest is in the upper pole measuring 0.6 cm.  The left kidney is unremarkable. The pancreas and adrenal glands appear normal.  The patient appears to be status post gastric bypass.  Small volume of scattered abdominal and pelvic ascites is identified.  The small and large bowel appear normal. No focal bony abnormality is identified.  IMPRESSION:  1.  Severe right hydronephrosis due to a 1.3 cm proximal right ureteral stone.  Multiple additional nonobstructing right renal stones are present. 2.  Cirrhosis with changes of portal hypertension.  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 3.  Status post gastric bypass.   Original Report Authenticated By: Arvid Right. Luther Parody, M.D.     Results for [name], [name] (MRN [id number]) as of 12/07/2011 00:25  Ref. Range 12/21/2009 05:09 12/24/2009 05:27 12/06/2011 21:28  AST Latest Range: 0-37 U/L 35 23 46 (H)  ALT Latest Range: 0-35 U/L 63 (H) 35 39 (H)    Results for [name], [name] (MRN [id number]) as of 12/07/2011 00:25  Ref. Range 12/23/2009 06:05 12/24/2009 05:27 05/02/2011 17:16 12/06/2011 21:28    Hemoglobin Latest Range: 12.0-15.0 g/dL 9.7 (L) 9.9 (L) 8.8 (L) 8.0 (L)  HCT Latest Range: 36.0-46.0 % 28.3 (L) 28.5 (L) 28.0 (L) 25.9 (L)  Platelets Latest Range: 150-400 K/uL 47 (L) 56 (L) 73 (L) 59 (L)   Results for [name], [name] (MRN [id number]) as of 12/07/2011 00:25  Ref. Range 12/19/2009 15:30 06/06/2011 14:05 12/06/2011 21:28  Prothrombin Time Latest Range: 11.6-15.2 seconds 17.7 (H) 18.9 (H) 19.2 (H)  INR Latest Range: 0.00-1.49  1.44 1.52 (H) 1.68 (H)      2315:  Pt will not allow in and out catheter to obtain UA/UC for the past several hours.  Pt encouraged again to try and give urine sample.  UA/UC pending.  Urine is the most likely source for fever, but hesitant to start abx before UA/UC obtained.  No fever while in ED but [facility] x2 obtained.  +ureteral calculi on CT scan.  Pt does not appear  septic at this time.  VS are stable, she remains afebrile.  T/C to [facility] Dr. Hilma Favors, case discussed, including:  HPI, pertinent PM/SHx, VS/PE, dx testing, ED course and treatment, as well as T/C from GI Dr. Laural Golden:  Agreeable to observation admit, requests to write temporary orders, obtain medical bed, aware UA/UC pending.        Alfonzo Feller, DO 12/08/11 2236

## 2011-12-07 NOTE — Progress Notes (Signed)
UR Chart Review Completed  

## 2011-12-07 NOTE — ED Notes (Signed)
Rechecked patient's vitals, explained to patient that I had a room assignment for her and that I was waiting for the nurse to take report. Provided patient with ice chips.

## 2011-12-07 NOTE — ED Notes (Signed)
Report attempted was told that the nurse was unavailable and would have to call me back.

## 2011-12-07 NOTE — ED Notes (Signed)
Report attempted a second time, nurse still unavailable at this time, nursing supervisor and charge nurse notified. Patient aggitated, states that she is ready to pull her iv out and go home. Advised that we were trying to get her upstairs as quick as we could.

## 2011-12-07 NOTE — H&P (Signed)
[facility] History and Physical  [name] [id number] DOB: 1960-04-11 DOA: 12/06/2011  Referring physician: EDP PCP: Glo Herring., MD  Specialists: N. Rehman  Chief Complaint: Abdominal Pain, Fever  HPI: [name] is a 51 y.o. 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 She has a history of nephrolithiasis and a CT obtained today showed 1.3cm ureteral stone with hydronephrosis and peri-nephric stranding. [facility] asked to admit for further evaluation and management.   Review of Systems: The patient denies anorexia, fever, weight loss,, vision loss, decreased hearing, hoarseness, chest pain, syncope, dyspnea on exertion, peripheral edema, balance deficits, hemoptysis, abdominal pain, melena, hematochezia, severe indigestion/heartburn, hematuria, incontinence, genital sores, muscle weakness, suspicious skin lesions, transient blindness, difficulty walking, depression, unusual weight change, abnormal bleeding, enlarged lymph nodes, angioedema, and breast masses.    Past Medical History  Diagnosis Date  . Esophageal varices   . Cirrhosis     non alcoholic  . Ascites   . Anemia   . Kidney stones    Past Surgical History  Procedure Date  . Abdominal hysterectomy   . Cholecystectomy   . Upper gastrointestinal endoscopy   . Colonoscopy   . Hernia   . Gastric bypass   . Cesarean section   . Hernia repair 04/14/11    Patient states that she hs had 4 total, with most recent  being 04/14/11.  . Lithotripsy   . Knee arthroscopy     Right knee  . Colonoscopy 10/21/2011    Procedure: COLONOSCOPY;  Surgeon: Rogene Houston, MD;  Location: [facility];  Service: Endoscopy;  Laterality: N/A;  47    Social History:  reports that she quit smoking about 20 years ago. Her smoking use included Cigarettes. She has never  used smokeless tobacco. She reports that she does not drink alcohol or use illicit drugs.   Allergies  Allergen Reactions  . Ancef (Cefazolin Sodium)     Blisters    Family History  Problem Relation Age of Onset  . Dementia Mother   . Heart disease Father   . Liver disease Father   . Hypertension Father   . Diabetes Father   . Dementia Father   . Hypertension Brother     Prior to Admission medications   Medication Sig Start Date End Date Taking? Authorizing Provider  b complex vitamins tablet Take 1 tablet by mouth daily.   Yes Historical Provider, MD  doxycycline (VIBRA-TABS) 100 MG tablet Take 100 mg by mouth Twice daily. For 10 days 11/26/11  Yes Historical Provider, MD  furosemide (LASIX) 40 MG tablet Take 40 mg by mouth daily.    Yes Historical Provider, MD  Multiple Vitamin (MULTIVITAMIN) capsule Take 1 capsule by mouth daily.   Yes Historical Provider, MD  nadolol (CORGARD) 20 MG tablet Take 10 mg by mouth daily.   Yes Historical Provider, MD  omeprazole (PRILOSEC) 20 MG capsule Take 20 mg by mouth Twice daily. 11/15/11  Yes Historical Provider, MD  rifaximin (XIFAXAN) 550 MG TABS Take 550 mg by mouth 2 (two) times daily.   Yes Historical Provider, MD  spironolactone (ALDACTONE) 100 MG tablet Take 300 mg by mouth daily.   Yes Historical Provider, MD  traZODone (DESYREL) 50 MG tablet Take 50 mg by mouth at bedtime.   Yes Historical Provider, MD  polyethylene  glycol (MIRALAX / GLYCOLAX) packet Take 17 g by mouth daily as needed. Constipation    Historical Provider, MD   Physical Exam: Filed Vitals:   12/06/11 2018 12/07/11 0108 12/07/11 0215  BP: 132/99 115/63 120/64  Pulse: 103 97 91  Temp: 99.7 F (37.6 C) 101 F (38.3 C) 101 F (38.3 C)  TempSrc: Oral Oral Oral  Resp:  19 18  Height: 5\' 6"  (1.676 m)    Weight: 65.772 kg (145 lb)  71.033 kg (156 lb 9.6 oz)  SpO2: 99% 98% 97%     General:  Upset about long ED wait and being admitted, asking to o to [facility]  Eyes:  normal  ENT: normal  Neck: normal  Cardiovascular: RRR, no mrg  Respiratory: CTAB  Abdomen: large ascites, non-tender  Skin: no rashes  Musculoskeletal: normal, no joint swelling  Psychiatric: depressed, anxiuous  Neurologic: non-focal  Labs on Admission:  Basic Metabolic Panel:  Lab [id number] 2128 12/02/11 0828  NA 133* 138  K 3.5 4.0  CL 100 103  CO2 24 29  GLUCOSE 176* 117*  BUN 14 12  CREATININE 0.91 0.86  CALCIUM 8.6 8.5  MG -- --  PHOS -- --   Liver Function Tests:  Lab 12/06/11 2128  AST 46*  ALT 39*  ALKPHOS 105  BILITOT 0.7  PROT 6.6  ALBUMIN 3.0*    Lab 12/06/11 2128  LIPASE 75*  AMYLASE --    Lab 12/06/11 2141  AMMONIA 54   CBC:  Lab 12/06/11 2128  WBC 5.1  NEUTROABS 4.3  HGB 8.0*  HCT 25.9*  MCV 73.6*  PLT 59*   CRadiological Exams on Admission: Dg Chest 2 View  12/06/2011  *RADIOLOGY REPORT*  Clinical Data: Nausea and vomiting.  History of cirrhosis.  CHEST - 2 VIEW  Comparison: PA and lateral chest 12/19/2009.  Findings: Lungs are clear.  Heart size is normal.  No pneumothorax or pleural fluid.  No focal bony abnormality.  IMPRESSION: No acute disease.   Original Report Authenticated By: Arvid Right. Luther Parody, M.D.    US Abdomen Complete  12/06/2011  *RADIOLOGY REPORT*  Clinical Data:  Cirrhosis, fever, history of hepatitis C  COMPLETE ABDOMINAL ULTRASOUND  Comparison:  Ultrasound the abdomen of 09/21/2010  Findings:  Gallbladder:  The gallbladder has previously been removed.  Common bile duct:  The common bile duct measures 7.4 mm in diameter most likely normal post cholecystectomy.  Liver:  The liver is echogenic and inhomogeneous consistent with fatty infiltration and probable cirrhosis.  There does appear to be thrombosis of the portal vein with varices.  IVC:  Appears normal.  Pancreas:  The pancreas is obscured by bowel gas.  Spleen:  There is splenomegaly present with the spleen measuring 19.2 cm sagittally, with a volume of  2486 ml.  A rounded isoechoic structure medial to the spleen may represent a splenule.  Right Kidney:  There is marked right hydronephrosis present. Echogenic foci are noted within the hydronephrotic kidney which may represent calculi versus possible air bubbles CT of the abdomen pelvis may be helpful to evaluate more sensitively.  Echogenic foci are noted within the right kidney which may represent calculi.  The right kidney measures 11.7 cm sagittally.  Left Kidney:  No left hydronephrosis is seen.  The left kidney measures 11.0 cm.  Multiple echogenic foci are again are noted which may represent calculi or calcifications possibly renovascular.  Abdominal aorta:  The abdominal aorta is normal in caliber with atheromatous plaque present.  A small amount of ascites is noted.  IMPRESSION:  1.  Marked right hydronephrosis.  Consider CT of the abdomen pelvis to assess further. 2.  Significant splenomegaly. 3.  Changes of cirrhosis of the liver with apparent portal vein thrombosis and varices. 4.  Small amount of ascites.   Original Report Authenticated By: Joretta Bachelor, M.D.    Ct Abdomen Pelvis W Contrast  12/06/2011  *RADIOLOGY REPORT*  Clinical Data: Normal pain.  Abnormal ultrasound demonstrating hydronephrosis.  CT ABDOMEN AND PELVIS WITH CONTRAST  Technique:  Multidetector CT imaging of the abdomen and pelvis was performed following the standard protocol during bolus administration of intravenous contrast.  Contrast: 138mL OMNIPAQUE IOHEXOL 300 MG/ML  SOLN  Comparison: CT abdomen and pelvis 12/20/2009 and abdominal ultrasound earlier this same date.  Findings: The lung bases are clear. No pleural or pericardial effusion.  The liver is shrunken with a nodular border consistent with cirrhosis.  The spleen measures 16.2 cm, not markedly changed. 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 There appears to be portal vein thrombosis with cavernous transformation.  As seen on ultrasound, there is  marked right hydronephrosis due to a 1.3 cm proximal right ureteral stone.  Multiple additional nonobstructing right renal stones identified.  The largest is in the upper pole measuring 0.6 cm.  The left kidney is unremarkable. The pancreas and adrenal glands appear normal.  The patient appears to be status post gastric bypass.  Small volume of scattered abdominal and pelvic ascites is identified.  The small and large bowel appear normal. No focal bony abnormality is identified.  IMPRESSION:  1.  Severe right hydronephrosis due to a 1.3 cm proximal right ureteral stone.  Multiple additional nonobstructing right renal stones are present. 2.  Cirrhosis with changes of portal hypertension.  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 3.  Status post gastric bypass.   Original Report Authenticated By: Arvid Right. D'ALESSIO, M.D.     EKG: Independently reviewed. No Change  Assessment/Plan Principal Problem:  *Pyelonephritis Active Problems:  Cirrhosis of liver  Anemia  Fever  Hydronephrosis of right kidney  Right ureteral stone  Thrombocytopenia   1. Pyleonephritis secondary to obstructing ureteral stone, TNC pyuria, fever and hypotension.  IV Levaquin started  Gentle Hydration  Symptomatic tx of fever and pain  Urology consult placed in EPIC, will need to follow up on consult in AM (she says she wants to be transferred to [facility])  2. Liver Disease, stable severe, c/o worsening ascites, patient requesting Dr. Laural Golden evaluate her for paracentesis, consult placed  3. Anemia, Chronic, will check CBC in AM.   Code Status: Full Code Family Communication: Discussed plan with patient but she is quite upset about her wait time in the ED and her perceived "delay" in getting her condition diagnosed and treated.  Disposition Plan: Will need IV antibiotics for possible pyelonephritis for 3-4 days until stone is taken care of and fevers  defervesce.  Time spent: 50 min  [facility] Pager [phone]  If 7PM-7AM, please contact night-coverage [email or web address] Password [email or web address] 12/07/2011, 6:16 AM

## 2011-12-07 NOTE — Consult Note (Signed)
[id number] consult note

## 2011-12-07 NOTE — Progress Notes (Signed)
CRITICAL VALUE ALERT  Critical value received:  Blood culture positive for gram negative rods.  Date of notification: 12/07/11 Time of notification: 1244  Critical value read back:yes  Nurse who received alert:  Cory Roughen  MD notified (1st page): Conley Canal  Time of first page:  1305: notified MD while she was here on the floor.   MD notified (2nd page):  Time of second page:  Responding MD:  Conley Canal  Time MD responded: [phone]

## 2011-12-07 NOTE — ED Notes (Signed)
Physician into assess patient at this time

## 2011-12-08 ENCOUNTER — Observation Stay (HOSPITAL_COMMUNITY): Payer: BC Managed Care – PPO

## 2011-12-08 ENCOUNTER — Encounter (HOSPITAL_COMMUNITY): Payer: Self-pay | Admitting: Anesthesiology

## 2011-12-08 ENCOUNTER — Encounter (HOSPITAL_COMMUNITY)
Admission: EM | Disposition: A | Discharge status: Other Acute Inpatient Hospital | Payer: Self-pay | Source: Home / Self Care | Attending: Internal Medicine

## 2011-12-08 ENCOUNTER — Inpatient Hospital Stay (HOSPITAL_COMMUNITY): Payer: BC Managed Care – PPO

## 2011-12-08 ENCOUNTER — Encounter (HOSPITAL_COMMUNITY): Payer: Self-pay | Admitting: *Deleted

## 2011-12-08 ENCOUNTER — Observation Stay (HOSPITAL_COMMUNITY): Payer: BC Managed Care – PPO | Admitting: Anesthesiology

## 2011-12-08 DIAGNOSIS — R7881 Bacteremia: Secondary | ICD-10-CM

## 2011-12-08 DIAGNOSIS — B9689 Other specified bacterial agents as the cause of diseases classified elsewhere: Secondary | ICD-10-CM

## 2011-12-08 HISTORY — PX: CYSTOSCOPY WITH STENT PLACEMENT: SHX5790

## 2011-12-08 HISTORY — DX: Bacteremia: R78.81

## 2011-12-08 HISTORY — PX: CYSTOSCOPY W/ RETROGRADES: SHX1426

## 2011-12-08 LAB — URINE CULTURE

## 2011-12-08 LAB — TYPE AND SCREEN: ABO/RH(D): B POS

## 2011-12-08 SURGERY — CYSTOSCOPY, WITH STENT INSERTION
Anesthesia: General | Site: Ureter | Laterality: Right | Wound class: Clean Contaminated

## 2011-12-08 MED ORDER — PROPOFOL 10 MG/ML IV EMUL
INTRAVENOUS | Status: AC
  Filled 2011-12-08: qty 20

## 2011-12-08 MED ORDER — STERILE WATER FOR IRRIGATION IR SOLN
Status: DC | PRN
  Administered 2011-12-08: 1000 mL

## 2011-12-08 MED ORDER — ONDANSETRON HCL 4 MG/2ML IJ SOLN: 4.0000 mg | Freq: Once | INTRAMUSCULAR | Status: DC | PRN

## 2011-12-08 MED ORDER — DEXAMETHASONE SODIUM PHOSPHATE 4 MG/ML IJ SOLN
INTRAMUSCULAR | Status: AC
  Filled 2011-12-08: qty 1

## 2011-12-08 MED ORDER — ONDANSETRON HCL 4 MG/2ML IJ SOLN
4.0000 mg | Freq: Once | INTRAMUSCULAR | Status: AC
  Administered 2011-12-08: 4 mg via INTRAVENOUS

## 2011-12-08 MED ORDER — DEXTROSE-NACL 5-0.45 % IV SOLN
INTRAVENOUS | Status: DC
  Administered 2011-12-08: 16:00:00 via INTRAVENOUS

## 2011-12-08 MED ORDER — DEXAMETHASONE SODIUM PHOSPHATE 4 MG/ML IJ SOLN
4.0000 mg | Freq: Once | INTRAMUSCULAR | Status: AC
  Administered 2011-12-08: 4 mg via INTRAVENOUS

## 2011-12-08 MED ORDER — FENTANYL CITRATE 0.05 MG/ML IJ SOLN: 25.0000 ug | INTRAMUSCULAR | Status: DC | PRN

## 2011-12-08 MED ORDER — PROPOFOL 10 MG/ML IV BOLUS
INTRAVENOUS | Status: DC | PRN
  Administered 2011-12-08: 100 mg via INTRAVENOUS

## 2011-12-08 MED ORDER — MIDAZOLAM HCL 2 MG/2ML IJ SOLN
1.0000 mg | INTRAMUSCULAR | Status: DC | PRN
  Administered 2011-12-08 (×2): 1 mg via INTRAVENOUS

## 2011-12-08 MED ORDER — FENTANYL CITRATE 0.05 MG/ML IJ SOLN
INTRAMUSCULAR | Status: AC
  Filled 2011-12-08: qty 2

## 2011-12-08 MED ORDER — MIDAZOLAM HCL 2 MG/2ML IJ SOLN
INTRAMUSCULAR | Status: AC
  Filled 2011-12-08: qty 2

## 2011-12-08 MED ORDER — MIDAZOLAM HCL 5 MG/5ML IJ SOLN
INTRAMUSCULAR | Status: DC | PRN
  Administered 2011-12-08: 0.5 mg via INTRAVENOUS

## 2011-12-08 MED ORDER — IOHEXOL 350 MG/ML SOLN
INTRAVENOUS | Status: DC | PRN
  Administered 2011-12-08: 50 mL via INTRAVENOUS

## 2011-12-08 MED ORDER — METOPROLOL TARTRATE 1 MG/ML IV SOLN
INTRAVENOUS | Status: AC
  Filled 2011-12-08: qty 5

## 2011-12-08 MED ORDER — LACTATED RINGERS IV SOLN
INTRAVENOUS | Status: DC
  Administered 2011-12-08: 1000 mL via INTRAVENOUS

## 2011-12-08 MED ORDER — SODIUM CHLORIDE 0.9 % IR SOLN
Status: DC | PRN
  Administered 2011-12-08 (×2): 3000 mL

## 2011-12-08 MED ORDER — METOPROLOL TARTRATE 1 MG/ML IV SOLN
5.0000 mg | Freq: Once | INTRAVENOUS | Status: AC
  Administered 2011-12-08: 5 mg via INTRAVENOUS

## 2011-12-08 MED ORDER — ONDANSETRON HCL 4 MG/2ML IJ SOLN
INTRAMUSCULAR | Status: AC
  Filled 2011-12-08: qty 2

## 2011-12-08 MED ORDER — FENTANYL CITRATE 0.05 MG/ML IJ SOLN
INTRAMUSCULAR | Status: DC | PRN
  Administered 2011-12-08: 25 ug via INTRAVENOUS
  Administered 2011-12-08: 50 ug via INTRAVENOUS
  Administered 2011-12-08: 25 ug via INTRAVENOUS

## 2011-12-08 MED ORDER — HYDROMORPHONE HCL PF 1 MG/ML IJ SOLN
1.0000 mg | INTRAMUSCULAR | Status: DC | PRN
  Administered 2011-12-08 – 2011-12-10 (×8): 1 mg via INTRAVENOUS
  Filled 2011-12-08 (×9): qty 1

## 2011-12-08 SURGICAL SUPPLY — 20 items
BAG DRAIN URO TABLE W/ADPT NS (DRAPE) ×3 IMPLANT
BAG DRN 8 ADPR NS SKTRN CSTL (DRAPE) ×2
CATH 5 FR WEDGE TIP (UROLOGICAL SUPPLIES) ×3 IMPLANT
CATH OPEN TIP 5FR (CATHETERS) ×3 IMPLANT
CATH URET WHISTLE 4FR (CATHETERS) ×2 IMPLANT
CATH URET WHISTLE 5FR 28IN (CATHETERS) ×2 IMPLANT
CLOTH BEACON ORANGE TIMEOUT ST (SAFETY) ×3 IMPLANT
DILATOR UROMAX ULTRA (MISCELLANEOUS) IMPLANT
GLIDEWIRE 3CM TIP (WIRE) ×2 IMPLANT
GLOVE BIO SURGEON STRL SZ7 (GLOVE) ×3 IMPLANT
GOWN STRL REIN XL XLG (GOWN DISPOSABLE) ×3 IMPLANT
IV NS IRRIG 3000ML ARTHROMATIC (IV SOLUTION) ×6 IMPLANT
KIT ROOM TURNOVER AP CYSTO (KITS) ×3 IMPLANT
MANIFOLD NEPTUNE II (INSTRUMENTS) ×3 IMPLANT
PACK CYSTO (CUSTOM PROCEDURE TRAY) ×3 IMPLANT
PAD ARMBOARD 7.5X6 YLW CONV (MISCELLANEOUS) ×3 IMPLANT
STENT PERCUFLEX 4.8FRX24 (STENTS) ×2 IMPLANT
STONE RETRIEVAL GEMINI 2.4 FR (MISCELLANEOUS) IMPLANT
TOWEL OR 17X26 4PK STRL BLUE (TOWEL DISPOSABLE) ×3 IMPLANT
WIRE GUIDE BENTSON .035 15CM (WIRE) ×3 IMPLANT

## 2011-12-08 NOTE — Brief Op Note (Signed)
12/06/2011 - 12/08/2011  1:58 PM  PATIENT:  [name]  51 y.o. female  PRE-OPERATIVE DIAGNOSIS:  right ureteral calculus  POST-OPERATIVE DIAGNOSIS:  right ureteral calculus  PROCEDURE:  Procedure(s) (LRB) with comments: CYSTOSCOPY WITH STENT PLACEMENT (Right) CYSTOSCOPY WITH RETROGRADE PYELOGRAM (Right)  SURGEON:  Surgeon(s) and Role:    * Marissa Nestle, MD - Primary  PHYSICIAN ASSISTANT:   ASSISTANTS: none   ANESTHESIA:   general  EBL:  Total I/O In: 574.7 [I.V.:574.7] Out: -   BLOOD ADMINISTERED:none  DRAINS: double j stent f5 24cm no string  LOCAL MEDICATIONS USED:  NONE  SPECIMEN:  No Specimen  DISPOSITION OF SPECIMEN:  N/A  COUNTS:    TOURNIQUET:  * No tourniquets in log *  DICTATION: dictation [id number].  PLAN OF CARE: Admit to inpatient   PATIENT DISPOSITION:  PACU - hemodynamically stable.   Delay start of Pharmacological VTE agent (>24hrs) due to surgical blood loss or risk of bleeding:

## 2011-12-08 NOTE — Transfer of Care (Signed)
Immediate Anesthesia Transfer of Care Note  Patient: [name]  Procedure(s) Performed: Procedure(s) (LRB) with comments: CYSTOSCOPY WITH STENT PLACEMENT (Right) CYSTOSCOPY WITH RETROGRADE PYELOGRAM (Right)  Patient Location: PACU  Anesthesia Type:MAC  Level of Consciousness: awake, alert  and oriented  Airway & Oxygen Therapy: Patient Spontanous Breathing and Patient connected to face mask oxygen  Post-op Assessment: Report given to PACU RN  Post vital signs: Reviewed and stable  Complications: No apparent anesthesia complications

## 2011-12-08 NOTE — Consult Note (Signed)
NAME:  [name], [name]                  ACCOUNT NO.:  [id number]  MEDICAL RECORD NO.:  [id number]  LOCATION:  [facility]                          FACILITY:  [facility]  PHYSICIAN:  Marissa Nestle, M.D.DATE OF BIRTH:  November 26, 1960  DATE OF CONSULTATION: DATE OF DISCHARGE:                                CONSULTATION   CHIEF COMPLAINT:  Right renal colic.  HISTORY OF PRESENT ILLNESS:  A 51 year old female who is well known to me.  I have seen her in a couple of years ago with the same problem. She has history of having multiple stones, multiple lithotripsies and stone baskets.  In last time when she was seen she had a right renal colic, right ureteral calculus.  She underwent ESL followed with one session of lithotripsy for the right renal calculi.  She on follow up was doing fine, but she never really had a KUB for me to tell how good results.  However, she had passed a lot of stones.  She never came back since December 2011.  I have not seen her.  When I asked her what is the reason she said she was feeling fine all this time, having no urological problem for the last 1 week.  She felt like she was having flu-like symptoms, then she started to have pain in the right flank.  No fever or chills.  Now she came to the emergency room with right flank pain and CT and ultrasound was done.  Ultrasound shows there is a massive right hydronephrosis with possible right upper ureteral calculus which was large 1.3 cm.  It is confirm with doing a CT scan.  She does have multiple smaller stones in the kidney, but there is a large stone in the right upper ureter below 1.3 cm in size causing obstruction.  PAST MEDICAL HISTORY:  She has history of having cirrhosis nonalcoholic, and few residual varices with ascites.  She is waiting to have a liver transplant for the last couple of years.  History of ascites, anemia, and kidney stones, multiple stone baskets and lithotripsies.  PAST SURGICAL HISTORY:  Include  abdominal hysterectomy, cholecystectomy, also had a history of having upper GI endoscopy, colonoscopy, gastric bypass surgery, and cesarean section.  Also had a hernia repair.  Right knee arthroscopy.  PERSONAL HISTORY:  She quit smoking about 20 years ago.  Does not drink any alcohol.  Does not take any illicit drugs.  ALLERGIES:  She is allergic to Ancef.  FAMILY HISTORY:  There is a history of having dementia, heart disease, liver disease, hypertension, diabetes.  REVIEW OF SYSTEMS:  Denies any chest pain, orthopnea, PND, nausea, or vomiting.  PHYSICAL EXAMINATION:  GENERAL:  Moderately built female, not in acute distress now. VITAL SIGNS:  Blood pressure 130/99, temperature is normal 99.7. ABDOMEN:  Soft, flat.  Liver, spleen, kidneys not palpable.  1+ right CVA tenderness. PELVIC:  Deferred.  LABORATORY DATA:  Sodium 133, potassium 3.5, chloride 100, CO2 is 24, glucose 176, BUN is 14, creatinine 0.91, calcium was 8.6.  CT scan was done it shows 1.3 cm stone in the right upper ureter marked right hydronephrosis.  There are addition calculi in  the right kidney.  She has significant splenomegaly.  IMPRESSION:  Right upper ureteral calculus, cirrhosis of the liver, nonalcoholic.  PLAN:  Cystoscopy insertion of double-J stent under anesthesia tomorrow. I discussed the procedure with the patient.  She also told me that she cannot have lithotripsy because her doctors have told her that she is waiting to have liver transplant, but I could not figure out why she was told that, I will have to call them tomorrow, but we definitely going to put a stent tomorrow then after that I will call them and see what was the reason that she cannot have a lithotripsy.     Marissa Nestle, M.D.     MIJ/MEDQ  D:  12/07/2011  T:  12/08/2011  Job:  [id number]

## 2011-12-08 NOTE — Progress Notes (Signed)
Spoke with [name], the transplant RN at [facility]. Notified him, per Dr. Conley Canal that pt isn't feeling well at this time and transportation is not able to be provided at this time. Will try again in the am (11/1) to set up transportation for pt to be transferred to [facility]. Pt is agreeable to this plan. Dr. Conley Canal made aware that a new bed request needs to be sent in the am, and [facility] will request physician to physician contact again tomorrow. [name]

## 2011-12-08 NOTE — Discharge Summary (Signed)
Physician Discharge Summary  Patient ID: [name] MRN: [id number] DOB/AGE: Oct 08, 1960 51 y.o.  Admit date: 12/06/2011 Discharge date: 12/08/2011  Discharge Diagnoses:  Principal Problem:  *Pyelonephritis Active Problems:  Gram-negative bacteremia  Cirrhosis of liver  Anemia  Hydronephrosis of right kidney  Right ureteral stone  Thrombocytopenia  Scheduled Meds:   . dexamethasone  4 mg Intravenous Once  . imipenem-cilastatin  500 mg Intravenous Q8H  . metoprolol  5 mg Intravenous Once  . nadolol  10 mg Oral Daily  . ondansetron (ZOFRAN) IV  4 mg Intravenous Once  . pantoprazole  40 mg Oral Daily  . rifaximin  550 mg Oral BID  . traZODone  50 mg Oral QHS   Continuous Infusions:   . sodium chloride 20 mL/hr at 12/08/11 0724  . lactated ringers 1,000 mL (12/08/11 1201)   PRN Meds:.alum & mag hydroxide-simeth, HYDROmorphone (DILAUDID) injection, midazolam, ondansetron (ZOFRAN) IV, oxyCODONE       Discharge Orders    Future Appointments: Provider: Department: Dept Phone: Center:   12/12/2011 10:15 AM [facility] [phone] None     Disposition: [facility]  Discharged Condition: stable  Consults: Treatment Team:  Rogene Houston, MD Marissa Nestle, MD  Labs:   Results for orders placed during the hospital encounter of 12/06/11 (from the past 48 hour(s))  PROTIME-INR     Status: Abnormal   Collection Time   12/06/11  9:28 PM      Component Value Range Comment   Prothrombin Time 19.2 (*) 11.6 - 15.2 seconds    INR 1.68 (*) 0.00 - 1.49   CBC WITH DIFFERENTIAL     Status: Abnormal   Collection Time   12/06/11  9:28 PM      Component Value Range Comment   WBC 5.1  4.0 - 10.5 K/uL    RBC 3.52 (*) 3.87 - 5.11 MIL/uL    Hemoglobin 8.0 (*) 12.0 - 15.0 g/dL    HCT 25.9 (*) 36.0 - 46.0 %    MCV 73.6 (*) 78.0 - 100.0 fL    MCH 22.7 (*) 26.0 - 34.0 pg    MCHC 30.9  30.0 - 36.0 g/dL    RDW 17.6 (*) 11.5 - 15.5 %    Platelets 59 (*)  150 - 400 K/uL    Neutrophils Relative 85 (*) 43 - 77 %    Neutro Abs 4.3  1.7 - 7.7 K/uL    Lymphocytes Relative 8 (*) 12 - 46 %    Lymphs Abs 0.4 (*) 0.7 - 4.0 K/uL    Monocytes Relative 5  3 - 12 %    Monocytes Absolute 0.3  0.1 - 1.0 K/uL    Eosinophils Relative 1  0 - 5 %    Eosinophils Absolute 0.0  0.0 - 0.7 K/uL    Basophils Relative 1  0 - 1 %    Basophils Absolute 0.0  0.0 - 0.1 K/uL   COMPREHENSIVE METABOLIC PANEL     Status: Abnormal   Collection Time   12/06/11  9:28 PM      Component Value Range Comment   Sodium 133 (*) 135 - 145 mEq/L    Potassium 3.5  3.5 - 5.1 mEq/L    Chloride 100  96 - 112 mEq/L    CO2 24  19 - 32 mEq/L    Glucose, Bld 176 (*) 70 - 99 mg/dL    BUN 14  6 - 23 mg/dL  Creatinine, Ser 0.91  0.50 - 1.10 mg/dL    Calcium 8.6  8.4 - 10.5 mg/dL    Total Protein 6.6  6.0 - 8.3 g/dL    Albumin 3.0 (*) 3.5 - 5.2 g/dL    AST 46 (*) 0 - 37 U/L    ALT 39 (*) 0 - 35 U/L    Alkaline Phosphatase 105  39 - 117 U/L    Total Bilirubin 0.7  0.3 - 1.2 mg/dL    GFR calc non Af Amer 72 (*) >90 mL/min    GFR calc Af Amer 84 (*) >90 mL/min   LIPASE, BLOOD     Status: Abnormal   Collection Time   12/06/11  9:28 PM      Component Value Range Comment   Lipase 75 (*) 11 - 59 U/L   CULTURE, BLOOD (ROUTINE X 2)     Status: Normal (Preliminary result)   Collection Time   12/06/11  9:28 PM      Component Value Range Comment   Specimen Description BLOOD LEFT ARM      Special Requests BOTTLES DRAWN AEROBIC AND ANAEROBIC 10CC      Culture  Setup Time 12/07/2011 12:20      Culture        Value: GRAM NEGATIVE RODS     Note: Performed at [facility]     Note: Gram Stain Report Called to,Read Back By and Verified With:  DICKERSON M. AT 1244 ON 12/07/2011 BY Elza Rafter.   Report Status PENDING     AMMONIA     Status: Normal   Collection Time   12/06/11  9:41 PM      Component Value Range Comment   Ammonia 54  11 - 60 umol/L   CULTURE, BLOOD (ROUTINE X 2)      Status: Normal (Preliminary result)   Collection Time   12/06/11  9:47 PM      Component Value Range Comment   Specimen Description BLOOD LEFT HAND      Special Requests BOTTLES DRAWN AEROBIC AND ANAEROBIC 8CC      Culture NO GROWTH 2 DAYS      Report Status PENDING     URINALYSIS, ROUTINE W REFLEX MICROSCOPIC     Status: Abnormal   Collection Time   12/07/11 12:00 AM      Component Value Range Comment   Color, Urine YELLOW  YELLOW    APPearance HAZY (*) CLEAR    Specific Gravity, Urine <1.005 (*) 1.005 - 1.030    pH 7.0  5.0 - 8.0    Glucose, UA NEGATIVE  NEGATIVE mg/dL    Hgb urine dipstick SMALL (*) NEGATIVE    Bilirubin Urine NEGATIVE  NEGATIVE    Ketones, ur NEGATIVE  NEGATIVE mg/dL    Protein, ur NEGATIVE  NEGATIVE mg/dL    Urobilinogen, UA 0.2  0.0 - 1.0 mg/dL    Nitrite NEGATIVE  NEGATIVE    Leukocytes, UA LARGE (*) NEGATIVE   URINE CULTURE     Status: Normal (Preliminary result)   Collection Time   12/07/11 12:00 AM      Component Value Range Comment   Specimen Description URINE, CLEAN CATCH      Special Requests NONE      Culture  Setup Time 12/07/2011 14:32      Colony Count >=100,000 COLONIES/ML      Culture GRAM NEGATIVE RODS      Report Status PENDING     URINE  MICROSCOPIC-ADD ON     Status: Abnormal   Collection Time   12/07/11 12:00 AM      Component Value Range Comment   Squamous Epithelial / LPF RARE  RARE    WBC, UA TOO NUMEROUS TO COUNT  <3 WBC/hpf    RBC / HPF 3-6  <3 RBC/hpf    Bacteria, UA MANY (*) RARE   SURGICAL PCR SCREEN     Status: Normal   Collection Time   12/08/11 10:20 AM      Component Value Range Comment   MRSA, PCR NEGATIVE  NEGATIVE    Staphylococcus aureus NEGATIVE  NEGATIVE     Diagnostics:  Dg Chest 2 View  12/06/2011  *RADIOLOGY REPORT*  Clinical Data: Nausea and vomiting.  History of cirrhosis.  CHEST - 2 VIEW  Comparison: PA and lateral chest 12/19/2009.  Findings: Lungs are clear.  Heart size is normal.  No pneumothorax or  pleural fluid.  No focal bony abnormality.  IMPRESSION: No acute disease.   Original Report Authenticated By: Arvid Right. Luther Parody, M.D.    US Abdomen Complete  12/06/2011  *RADIOLOGY REPORT*  Clinical Data:  Cirrhosis, fever, history of hepatitis C  COMPLETE ABDOMINAL ULTRASOUND  Comparison:  Ultrasound the abdomen of 09/21/2010  Findings:  Gallbladder:  The gallbladder has previously been removed.  Common bile duct:  The common bile duct measures 7.4 mm in diameter most likely normal post cholecystectomy.  Liver:  The liver is echogenic and inhomogeneous consistent with fatty infiltration and probable cirrhosis.  There does appear to be thrombosis of the portal vein with varices.  IVC:  Appears normal.  Pancreas:  The pancreas is obscured by bowel gas.  Spleen:  There is splenomegaly present with the spleen measuring 19.2 cm sagittally, with a volume of 2486 ml.  A rounded isoechoic structure medial to the spleen may represent a splenule.  Right Kidney:  There is marked right hydronephrosis present. Echogenic foci are noted within the hydronephrotic kidney which may represent calculi versus possible air bubbles CT of the abdomen pelvis may be helpful to evaluate more sensitively.  Echogenic foci are noted within the right kidney which may represent calculi.  The right kidney measures 11.7 cm sagittally.  Left Kidney:  No left hydronephrosis is seen.  The left kidney measures 11.0 cm.  Multiple echogenic foci are again are noted which may represent calculi or calcifications possibly renovascular.  Abdominal aorta:  The abdominal aorta is normal in caliber with atheromatous plaque present.  A small amount of ascites is noted.  IMPRESSION:  1.  Marked right hydronephrosis.  Consider CT of the abdomen pelvis to assess further. 2.  Significant splenomegaly. 3.  Changes of cirrhosis of the liver with apparent portal vein thrombosis and varices. 4.  Small amount of ascites.   Original Report Authenticated By: Joretta Bachelor, M.D.    Ct Abdomen Pelvis W Contrast  12/06/2011  *RADIOLOGY REPORT*  Clinical Data: Normal pain.  Abnormal ultrasound demonstrating hydronephrosis.  CT ABDOMEN AND PELVIS WITH CONTRAST  Technique:  Multidetector CT imaging of the abdomen and pelvis was performed following the standard protocol during bolus administration of intravenous contrast.  Contrast: 143mL OMNIPAQUE IOHEXOL 300 MG/ML  SOLN  Comparison: CT abdomen and pelvis 12/20/2009 and abdominal ultrasound earlier this same date.  Findings: The lung bases are clear. No pleural or pericardial effusion.  The liver is shrunken with a nodular border consistent with cirrhosis.  The spleen measures 16.2 cm, not markedly changed.  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 There appears to be portal vein thrombosis with cavernous transformation.  As seen on ultrasound, there is marked right hydronephrosis due to a 1.3 cm proximal right ureteral stone.  Multiple additional nonobstructing right renal stones identified.  The largest is in the upper pole measuring 0.6 cm.  The left kidney is unremarkable. The pancreas and adrenal glands appear normal.  The patient appears to be status post gastric bypass.  Small volume of scattered abdominal and pelvic ascites is identified.  The small and large bowel appear normal. No focal bony abnormality is identified.  IMPRESSION:  1.  Severe right hydronephrosis due to a 1.3 cm proximal right ureteral stone.  Multiple additional nonobstructing right renal stones are present. 2.  Cirrhosis with changes of portal hypertension.  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 3.  Status post gastric bypass.   Original Report Authenticated By: Arvid Right. Luther Parody, M.D.    Procedures:  Cystoscopy and insertion of double J stent on 12/08/11  Full Code   Hospital Course: See H&P for complete admission details. [name] is a 51 year old white  female with a history of advanced cirrhosis secondary to successfully treated chronic hepatitis C, awaiting liver transplant. She had an outpatient ultrasound as recommended by her hepatologist at [facility]. It revealed market right hydronephrosis. The original ultrasound was reportedly to evaluate ascites. Dr. Laural Golden, patient's local gastroenterologist, called the patient and recommended she come to the emergency room for further evaluation. She reported upon further questioning that she has had abdominal pain and fever worsening over the past several days. CAT scan in the emergency room confirmed severe right hydronephrosis, noted to be do to with 1.3 cm proximal right ureteral stone with multiple additional nonobstructing right renal stones. Temperature in the emergency room was 101F. Blood pressure 132/99. Heart rate 103. Oxygen saturation 99%. She had ascites noted. Her abdomen was nontender. Mild CVA tenderness. Urinalysis shows large leukocyte esterase. Negative nitrites. Small hemoglobin. Too numerous to count white cells. Rare squamous epithelial cells. 3-6 red cells. Many bacteria. She has a chronic coagulopathy, anemia, thrombocytopenia. See above for further laboratory values, imaging results.  Patient was admitted and started initially on Cipro. Patient has had recurrent urinary tract infections and previous cultures showed Klebsiella pneumonia which was resistant to fluoroquinolones. She was therefore switched to imipenem pending culture results. She has a history of recurrent nephrolithiasis requiring ureteral stenting, lithotripsy, basket retrieval in the past. Apparently, she was told by the transplant team that she was no longer a candidate for lithotripsy, but does not know why. She requested transfer to [facility], where her transplant team is located. Dr. Frederick Peers was contacted. No beds were available. Therefore, patient was seen by Dr. Michela Pitcher, urologist who recommended ureteral stenting. Patient is  currently in the operating room, but [facility] has called and asked for discharge summary. Blood cultures are growing gram-negative rods, identification and sensitivities still pending. Urine culture growing the same. Patient's fever has defervesced. She's had no vomiting. Her pain has been minimal. Her blood pressure and vital signs have been stable. She has been monitored on the MedSurg unit. I have spoken with Dr. Frederick Peers to give an update. Any further clinical evidence or data will be addended as needed. After the procedure, patient may be transferred to [facility] for continued treatment of her bacteremia, pyelonephritis, obstructing ureteral stone, and decision regarding definitive management of the stone. Total time of day  of transfer greater than 30 minutes.   Discharge Exam:  See progress note from today.  SignedDelfina Redwood 12/08/2011, 12:39 PM

## 2011-12-08 NOTE — Brief Op Note (Signed)
12/06/2011 - 12/08/2011  1:56 PM  PATIENT:  [name]  51 y.o. female  PRE-OPERATIVE DIAGNOSIS:  right ureteral calculus  POST-OPERATIVE DIAGNOSIS:  right ureteral calculus  PROCEDURE:  Procedure(s) (LRB) with comments: CYSTOSCOPY WITH STENT PLACEMENT (Right) CYSTOSCOPY WITH RETROGRADE PYELOGRAM (Right)  SURGEON:  Surgeon(s) and Role:    * Marissa Nestle, MD - Primary  PHYSICIAN ASSISTANT:   ASSISTANTS: none   ANESTHESIA:   general  EBL:  Total I/O In: 574.7 [I.V.:574.7] Out: -   BLOOD ADMINISTERED:none  DRAINS:    LOCAL MEDICATIONS USED:  NONE  SPECIMEN:  No Specimen  DISPOSITION OF SPECIMEN:  N/A  COUNTS:  YES  TOURNIQUET:  * No tourniquets in log *  DICTATION: .Other Dictation: Dictation Number [id number]  PLAN OF CARE: Admit to inpatient   PATIENT DISPOSITION:  PACU - hemodynamically stable.   Delay start of Pharmacological VTE agent (>24hrs) due to surgical blood loss or risk of bleeding:

## 2011-12-08 NOTE — Anesthesia Preprocedure Evaluation (Addendum)
Anesthesia Evaluation  Patient identified by MRN, date of birth, ID band Patient awake    Reviewed: Allergy & Precautions, H&P , NPO status , Patient's Chart, lab work & pertinent test results  Airway Mallampati: I TM Distance: >3 FB     Dental  (+) Teeth Intact   Pulmonary neg pulmonary ROS, former smoker,  breath sounds clear to auscultation        Cardiovascular negative cardio ROS  Rhythm:Regular Rate:Normal     Neuro/Psych    GI/Hepatic (+) Cirrhosis -  Esophageal Varices and ascites     ,   Endo/Other    Renal/GU      Musculoskeletal   Abdominal   Peds  Hematology  (+) Blood dyscrasia (thrombocytopenia), anemia ,   Anesthesia Other Findings   Reproductive/Obstetrics                           Anesthesia Physical Anesthesia Plan  ASA: III  Anesthesia Plan: General   Post-op Pain Management:    Induction: Intravenous  Airway Management Planned: LMA  Additional Equipment:   Intra-op Plan:   Post-operative Plan: Extubation in OR  Informed Consent: I have reviewed the patients History and Physical, chart, labs and discussed the procedure including the risks, benefits and alternatives for the proposed anesthesia with the patient or authorized representative who has indicated his/her understanding and acceptance.     Plan Discussed with: CRNA  Anesthesia Plan Comments:         Anesthesia Quick Evaluation

## 2011-12-08 NOTE — Anesthesia Postprocedure Evaluation (Signed)
  Anesthesia Post-op Note  Patient: [name]  Procedure(s) Performed: Procedure(s) (LRB) with comments: CYSTOSCOPY WITH STENT PLACEMENT (Right) CYSTOSCOPY WITH RETROGRADE PYELOGRAM (Right)  Patient Location: PACU  Anesthesia Type:MAC  Level of Consciousness: awake, alert  and oriented  Airway and Oxygen Therapy: Patient Spontanous Breathing  Post-op Pain: none  Post-op Assessment: Post-op Vital signs reviewed, Patient's Cardiovascular Status Stable, Respiratory Function Stable, Patent Airway and No signs of Nausea or vomiting  Post-op Vital Signs: Reviewed and stable  Complications: No apparent anesthesia complications

## 2011-12-08 NOTE — Anesthesia Procedure Notes (Signed)
Procedure Name: LMA Insertion Date/Time: 12/08/2011 1:03 PM Performed by: Tressie Stalker E Pre-anesthesia Checklist: Patient identified, Patient being monitored, Emergency Drugs available, Timeout performed and Suction available Patient Re-evaluated:Patient Re-evaluated prior to inductionOxygen Delivery Method: Circle System Utilized Preoxygenation: Pre-oxygenation with 100% oxygen Intubation Type: IV induction Ventilation: Mask ventilation without difficulty LMA: LMA inserted LMA Size: 3.0 Number of attempts: 1 Placement Confirmation: positive ETCO2 and breath sounds checked- equal and bilateral

## 2011-12-08 NOTE — Progress Notes (Signed)
Discussed with transplant coordinator, Merry Proud.  Patient back and forth with whether to proceed with JJ stent, but after much discussion, now agrees.   Subjective:  No new complaints.  Filed Vitals:   12/07/11 1247 12/07/11 1402 12/07/11 2102 12/08/11 0644  BP: 98/62 109/54 119/64 106/64  Pulse: 72 73 78 76  Temp: 98.2 F (36.8 C) 99.1 F (37.3 C) 99.3 F (37.4 C) 99.6 F (37.6 C)  TempSrc: Axillary Oral  Oral  Resp: 20 18 18 18   Height:      Weight:      SpO2: 95% 94% 97% 93%   General: Comfortable. Nontoxic. Alert and oriented.  HEENT: Sclera nonicteric. Dry mucous membranes. Lungs: Clear to auscultation bilaterally without wheeze rhonchi or rales Cardiovascular regular rate rhythm with 3/6 systolic murmur Abdomen: Protuberant, soft, nontender. Fluid wave present Extremities minimal edema  Blood cultures growing gram-negative rods. Urine culture growing gram-negative rods.  Principal Problem:  *Pyelonephritis Active Problems:  Gram-negative bacteremia  Cirrhosis of liver  Anemia  Hydronephrosis of right kidney  Right ureteral stone  Thrombocytopenia  No beds have become available at [facility]. Patient agreeable to proceed with double-J stenting. Continue to imipenem and followup cultures. Blood pressure has been stable.  Appreciate consultants. Total time 35 minutes.

## 2011-12-09 ENCOUNTER — Encounter (HOSPITAL_COMMUNITY): Payer: Self-pay | Admitting: Urology

## 2011-12-09 LAB — BASIC METABOLIC PANEL
BUN: 11 mg/dL (ref 6–23)
Chloride: 100 mEq/L (ref 96–112)
Creatinine, Ser: 0.78 mg/dL (ref 0.50–1.10)
GFR calc Af Amer: >90 mL/min (ref >90)
GFR calc non Af Amer: >90 mL/min (ref >90)
Glucose, Bld: 263 mg/dL — ABNORMAL HIGH (ref 70–99)

## 2011-12-09 LAB — CBC
HCT: 26 % — ABNORMAL LOW (ref 36.0–46.0)
MCHC: 31.2 g/dL (ref 30.0–36.0)
MCV: 75.1 fL — ABNORMAL LOW (ref 78.0–100.0)
RDW: 18.1 % — ABNORMAL HIGH (ref 11.5–15.5)

## 2011-12-09 LAB — CULTURE, BLOOD (ROUTINE X 2)

## 2011-12-09 MED ORDER — FUROSEMIDE 40 MG PO TABS
40.0000 mg | ORAL_TABLET | Freq: Every day | ORAL | Status: DC
  Administered 2011-12-09 – 2011-12-10 (×2): 40 mg via ORAL
  Filled 2011-12-09 (×2): qty 1

## 2011-12-09 MED ORDER — PIPERACILLIN-TAZOBACTAM 3.375 G IVPB
3.3750 g | Freq: Three times a day (TID) | INTRAVENOUS | Status: DC
  Administered 2011-12-09 – 2011-12-10 (×5): 3.375 g via INTRAVENOUS
  Filled 2011-12-09 (×11): qty 50

## 2011-12-09 MED ORDER — ONDANSETRON HCL 4 MG/2ML IJ SOLN
4.0000 mg | Freq: Four times a day (QID) | INTRAMUSCULAR | Status: DC | PRN
  Administered 2011-12-09 (×2): 4 mg via INTRAVENOUS
  Filled 2011-12-09 (×3): qty 2

## 2011-12-09 MED ORDER — SPIRONOLACTONE 100 MG PO TABS
300.0000 mg | ORAL_TABLET | Freq: Every day | ORAL | Status: DC
  Administered 2011-12-09 – 2011-12-10 (×2): 300 mg via ORAL
  Filled 2011-12-09 (×5): qty 3

## 2011-12-09 NOTE — Progress Notes (Signed)
Transfer to the [facility] yesterday canceled, as we were unable to arrange for transportation via [facility]. Also, Dr. Michela Pitcher has plans for ultrasound today, and patient did not feel well enough to go.  Subjective:  Feels more abdominal distention. Requesting diuretics be resumed. Pain increased. Feels nauseated.  Filed Vitals:   12/08/11 1445 12/08/11 1500 12/08/11 2109 12/09/11 0618  BP: 104/63 107/62 109/65 106/65  Pulse: 69 72 66 61  Temp:  98 F (36.7 C) 98 F (36.7 C) 97.5 F (36.4 C)  TempSrc:      Resp: 18 20 20 20   Height:      Weight:    74.753 kg (164 lb 12.8 oz)  SpO2: 90% 91% 96% 94%   General: Comfortable. Nontoxic. Alert and oriented.  HEENT: Sclera nonicteric. Dry mucous membranes. Lungs: Clear to auscultation bilaterally without wheeze rhonchi or rales Cardiovascular regular rate rhythm with 3/6 systolic murmur Abdomen: Protuberant, soft, nontender. Fluid wave present. Abdominal girth increased from previous. Extremities minimal edema  Blood cultures growing gram-negative rods.  Urine culture grew Klebsiella pneumoniae which is resistant to fluoroquinolones, ampicillin, sensitive to Zosyn and gentamicin  Principal Problem:  *Pyelonephritis Active Problems:  Gram-negative bacteremia  Cirrhosis of liver  Anemia  Hydronephrosis of right kidney  Right ureteral stone  Thrombocytopenia  Will change imipenem to Zosyn. Patient's pain is better controlled on IV Dilaudid. Continue anti-emetics. Decrease IV fluids and resume diuretics. Will defer any potential plans of transfer to Dr. Michela Pitcher, now that double J stent has been placed. Patient is agreeable to this plan.

## 2011-12-09 NOTE — Progress Notes (Signed)
Discussed with Dr. Geraldo Pitter. He reviewed the ultrasound. Apparently, the double-J stent was not successful at decompressing the hydronephrosis. Patient will need a percutaneous nephrostomy and definitive management of the stone. He recommends transfer, as we are unable to accommodate patient's needs here at [facility]. I contacted [facility] again, discussed the case with Dr. Jari Pigg, hospitalist. There are no beds there, but hopefully within the next day, she can be transferred there. Dr. Ronnald Ramp has accepted her pending bed availability. Patient is clinically stable, with a stable creatinine, so there is no emergent need for decompression. Dr. Geraldo Pitter reports that waiting 24-48 hours is safe. If patient becomes unstable, or beds do not become available soon, will explore transferring to [facility] or [facility].

## 2011-12-09 NOTE — Procedures (Signed)
General status satisfactory.bp90/50 temp.97.2 Abdominal girth stable . Ascites not any worse pain she says worse than before in r flank.r hydro not much improved almost same the stent on the side of the stone and not above it in the renal pelvis.has positive urine and blood cultures.urine culture covered with sensitive antibiotics.blood culture senitivities are pending.i think next step is to drain r kidney thru r percutaneous nephrostomy. Then to get the stone out. She will need UBE holmium lithotripsy. [facility] i can get the kidney situation under control but she is [facility]. She will be better off there they can handle both things in one place. For me it will be more complicated as i will  have to send her to [place] for per cutaneous  nephrostomy. She has elevated pro time. I  have discussed this with Dr. . Conley Canal.she is going to handle transfer . [place]she says she is going to try.

## 2011-12-09 NOTE — Addendum Note (Signed)
Addendum  created 12/09/11 0746 by Charmaine Downs, CRNA   Modules edited:Notes Section

## 2011-12-09 NOTE — Anesthesia Postprocedure Evaluation (Signed)
  Anesthesia Post-op Note  Patient: [name]  Procedure(s) Performed: Procedure(s) (LRB) with comments: CYSTOSCOPY WITH STENT PLACEMENT (Right) CYSTOSCOPY WITH RETROGRADE PYELOGRAM (Right)  Patient Location: room 324  Anesthesia Type:General  Level of Consciousness: awake, alert , oriented and patient cooperative  Airway and Oxygen Therapy: Patient Spontanous Breathing  Post-op Pain: none  Post-op Assessment: Post-op Vital signs reviewed, Patient's Cardiovascular Status Stable, Respiratory Function Stable, Patent Airway, No signs of Nausea or vomiting and Pain level controlled  Post-op Vital Signs: Reviewed and stable  Complications: No apparent anesthesia complications

## 2011-12-09 NOTE — Op Note (Signed)
NAME:  [name], [name]                  ACCOUNT NO.:  [id number]  MEDICAL RECORD NO.:  [id number]  LOCATION:  XRAY                          FACILITY:  [facility]  PHYSICIAN:  Marissa Nestle, M.D.DATE OF BIRTH:  December 18, 1960  DATE OF PROCEDURE: DATE OF DISCHARGE:  12/06/2011                              OPERATIVE REPORT   PREOPERATIVE DIAGNOSIS:  Right upper ureteral calculus with hydronephrosis.  POSTOPERATIVE DIAGNOSIS:  Right upper ureteral calculus with hydronephrosis.  PROCEDURE:  Cystoscopy, right retrograde pyelogram, insertion of double- J stent size 5-French 24 cm without any string.  ANESTHESIA:  General.  The patient under general anesthesia in lithotomy position.  After usual prep and drape, #25 cystoscope introduced into the bladder.  Right ureteral orifice catheterized with a wedge catheter.  Hypaque was injected under fluoroscopic control.  Dye goes up into the right UPJ and I can see there is a filling defect, which correspond to the stone on CT scan and above the stone there is moderate hydronephrosis.  Then, wedge catheter was removed.  An open-end catheter is inserted all the way to the level of the stone and the [facility] guidewire was passed and with some difficulty it went into the renal pelvis.  Over that guidewire, I tried to advance the open-end catheter but it will not go.  Then I changed the guidewire to Glidewire which goes into the kidney without difficulty but I still cannot advance the catheter over it.  So at this point, I decided to try double-J stent.  A double-J stent was passed.  It appears like it is above the stone but it did not really curl up real good and the bladder site the stent curls up nicely.  So, I removed the guidewire and I tried to pass a whistle-tip catheter #3 alongside this double-J stent.  It goes to the level of the stone, but it will not go into the renal pelvis.  I injected the dye through the whistle-tip catheter.  It does not go  into the renal pelvis, so it appears like the stone is causing quite severe obstruction of the UPJ and I wanted to get the open- end catheter all the way in the renal pelvis, but I cannot do that, but it looks like the guidewire was in the renal pelvis because I could see that it curls up when it goes into the renal pelvis and renal pelvis was outlined when I injected the dye, flushed with the help of a wedge catheter.  Anyway I decided to leave the double-J stent. Upper end may not be in the perfect position but looking at the x-ray, it looks like it is above the stone, although the curl is not perfect but it is definitely above the stone.  All the instruments were removed. The patient left the operating room in satisfactory condition.     Marissa Nestle, M.D.     MIJ/MEDQ  D:  12/08/2011  T:  12/09/2011  Job:  [id number]

## 2011-12-10 LAB — CBC
Hemoglobin: 7.9 g/dL — ABNORMAL LOW (ref 12.0–15.0)
MCH: 23.2 pg — ABNORMAL LOW (ref 26.0–34.0)
MCHC: 30.6 g/dL (ref 30.0–36.0)
MCV: 75.7 fL — ABNORMAL LOW (ref 78.0–100.0)
Platelets: 56 10*3/uL — ABNORMAL LOW (ref 150–400)

## 2011-12-10 LAB — BASIC METABOLIC PANEL
Calcium: 8 mg/dL — ABNORMAL LOW (ref 8.4–10.5)
Creatinine, Ser: 1.02 mg/dL (ref 0.50–1.10)
GFR calc non Af Amer: 63 mL/min — ABNORMAL LOW (ref >90)
Glucose, Bld: 118 mg/dL — ABNORMAL HIGH (ref 70–99)
Sodium: 137 mEq/L (ref 135–145)

## 2011-12-10 MED ORDER — HYDROMORPHONE HCL PF 1 MG/ML IJ SOLN
1.0000 mg | INTRAMUSCULAR | Status: DC | PRN
  Administered 2011-12-10: 2 mg via INTRAVENOUS
  Filled 2011-12-10: qty 2

## 2011-12-10 MED ORDER — POTASSIUM CHLORIDE CRYS ER 10 MEQ PO TBCR: 10.0000 meq | EXTENDED_RELEASE_TABLET | Freq: Four times a day (QID) | ORAL | Status: DC

## 2011-12-10 MED ORDER — CLONIDINE HCL 0.1 MG PO TABS
ORAL_TABLET | ORAL | Status: AC
  Filled 2011-12-10: qty 1

## 2011-12-10 MED ORDER — ONDANSETRON HCL 4 MG/2ML IJ SOLN
4.0000 mg | INTRAMUSCULAR | Status: DC | PRN
  Administered 2011-12-10: 4 mg via INTRAVENOUS
  Filled 2011-12-10: qty 2

## 2011-12-10 NOTE — Progress Notes (Signed)
Pt. Transport to [facility] via [facility] via [facility].

## 2011-12-10 NOTE — Progress Notes (Addendum)
  Subjective:  C/o pain and nausea  Filed Vitals:   12/09/11 1436 12/09/11 2014 12/09/11 2214 12/10/11 0612  BP: 90/52  154/77 96/58  Pulse: 87 67 66 64  Temp: 97.2 F (36.2 C)  97.8 F (36.6 C) 97.5 F (36.4 C)  TempSrc:   Oral Oral  Resp: 20 20 20 20   Height:      Weight:    72.893 kg (160 lb 11.2 oz)  SpO2: 96% 97% 95% 96%   General: Appears uncomfortable at the edge of the bed.  HEENT: Sclera nonicteric. Dry mucous membranes. Lungs: Clear to auscultation bilaterally without wheeze rhonchi or rales Cardiovascular regular rate rhythm with 3/6 systolic murmur Abdomen: Protuberant, soft, nontender. Fluid wave present. Abdominal girth increased from previous. Extremities minimal edema  Blood cultures Klebsiella pneumonia Urine culture grew Klebsiella pneumoniae which is resistant to fluoroquinolones, ampicillin, sensitive to Zosyn and gentamicin Assessment and plan  Principal Problem:  *Pyelonephritis Klebsiella pneumonia Active Problems: Klebsiella pneumonia bacteremia  Cirrhosis of liver  Anemia  Hydronephrosis of right kidney,  Obstructing Right ureteral stone, status post JJ stent, with post procedure ultrasound showing only partial improvement in hydronephrosis.  Thrombocytopenia  Discussed with Dr. Geraldo Pitter. He reviewed the ultrasound. Apparently, the double-J stent was not successful at decompressing the hydronephrosis. Patient will need a percutaneous nephrostomy and definitive management of the stone. He recommends transfer, as we are unable to accommodate patient's needs here at [facility]. I contacted [facility] yesterday, discussed the case with Dr. Jari Pigg, hospitalist. Bed has become available today and we are awaiting transportation .  See previously dictated summary for details. Total discharge time greater than 30 minutes.

## 2011-12-10 NOTE — Progress Notes (Signed)
Report called to L Herhing RN at [facility]. [facility] to transport pt. To [facility].

## 2011-12-11 LAB — CULTURE, BLOOD (ROUTINE X 2): Culture: NO GROWTH

## 2011-12-12 ENCOUNTER — Ambulatory Visit (HOSPITAL_COMMUNITY): Payer: BC Managed Care – PPO

## 2011-12-12 MED FILL — Ondansetron HCl Inj 4 MG/2ML (2 MG/ML): INTRAMUSCULAR | Qty: 2 | Status: AC

## 2011-12-12 MED FILL — Hydromorphone HCl Preservative Free (PF) Inj 2 MG/ML: INTRAMUSCULAR | Qty: 1 | Status: AC

## 2011-12-14 ENCOUNTER — Encounter (INDEPENDENT_AMBULATORY_CARE_PROVIDER_SITE_OTHER): Payer: Self-pay

## 2011-12-23 ENCOUNTER — Encounter (HOSPITAL_COMMUNITY): Payer: BC Managed Care – PPO | Attending: Internal Medicine

## 2011-12-23 DIAGNOSIS — Z452 Encounter for adjustment and management of vascular access device: Secondary | ICD-10-CM

## 2011-12-23 NOTE — Patient Instructions (Addendum)
Peripherally Inserted Central Catheter (PICC) Removal and Care After A peripherally inserted catheter (PICC) is removed when it is no longer needed, when it is clotted, or when it may be infected.  PROCEDURE  The removal of a PICC is usually painless. Removing the tape that holds the PICC in place may be the most discomfort you have.  A physicians order needs to be obtained to have the PICC removed.  A PICC can be removed in the hospital or in an outpatient setting.  Never remove or take out the PICC yourself. Only a trained clinical professional, such as a PICC nurse, should remove the PICC.  If a PICC is suspected to be infected, the PICC tip is sent to the lab for culture. HOME CARE INSTRUCTIONS  When the PICC is out, pressure is applied at the insertion site to prevent bleeding. An antibiotic ointment may be applied to the insertion site. A dry, sterile gauze is then taped over the insertion site. This dressing should stay on for 24 hours.  After the 24 hours is up, the dressing may be removed. The PICC insertion site is very small. A small scab may develop over the insertion site. It is okay to wash the site gently with soap and water. Be careful to not remove or pick the scab off. After washing, gently pat the site dry. You do not need to put another dressing over the insertion site after you wash it.  Avoid heavy, strenuous physical activity for 24 hours after the PICC is removed. This includes things like:  Weight lifting.  Strenuous yard work.  Any physical activity with repetitive arm movement. SEEK MEDICAL CARE IF:  Call or see your caregiver as soon as possible if you develop the following conditions in the arm in which the PICC was inserted:  Swelling or puffiness.  Increasing tenderness or pain. SEEK IMMEDIATE MEDICAL CARE IF:  You develop any of the following conditions in the arm that had the PICC:  Numbness or tingling in your fingers, hand, or arm.  You arm has  a bluish color and it is cold to the touch.  Redness around the insertion site or a red-streak that goes up your arm.  Any type of drainage from the PICC insertion site. This includes drainage such as:  Bleeding from the insertion site. (If this happens, apply firm, direct pressure to the PICC insertion site with a clean towel.)  Drainage that is yellow or tan in color.  You have an oral temperature above 102 F (38.9 C), not controlled by medicine. Document Released: 07/14/2009 Document Revised: 04/18/2011 Document Reviewed: 07/14/2009 [facility] Patient Information 2013 [place].

## 2011-12-23 NOTE — Progress Notes (Signed)
PICC line right upper discontinued per protocol. After care instructions reviewed with pt. Dressing dry and intact when pt left clinic.

## 2011-12-26 ENCOUNTER — Encounter (INDEPENDENT_AMBULATORY_CARE_PROVIDER_SITE_OTHER): Payer: Self-pay

## 2012-05-14 ENCOUNTER — Encounter (INDEPENDENT_AMBULATORY_CARE_PROVIDER_SITE_OTHER): Payer: Self-pay | Admitting: Internal Medicine

## 2012-05-14 ENCOUNTER — Ambulatory Visit (HOSPITAL_COMMUNITY)
Admission: RE | Admit: 2012-05-14 | Discharge: 2012-05-14 | Disposition: A | Discharge status: Home / Self Care | Payer: BC Managed Care – PPO | Source: Ambulatory Visit | Attending: Internal Medicine | Admitting: Internal Medicine

## 2012-05-14 ENCOUNTER — Ambulatory Visit (INDEPENDENT_AMBULATORY_CARE_PROVIDER_SITE_OTHER): Payer: BC Managed Care – PPO | Admitting: Internal Medicine

## 2012-05-14 ENCOUNTER — Other Ambulatory Visit (INDEPENDENT_AMBULATORY_CARE_PROVIDER_SITE_OTHER): Payer: Self-pay | Admitting: Internal Medicine

## 2012-05-14 VITALS — BP 122/60 | HR 88 | Temp 98.6°F | Ht 66.0 in | Wt 159.5 lb

## 2012-05-14 DIAGNOSIS — B1920 Unspecified viral hepatitis C without hepatic coma: Secondary | ICD-10-CM

## 2012-05-14 DIAGNOSIS — R188 Other ascites: Secondary | ICD-10-CM

## 2012-05-14 DIAGNOSIS — K7460 Unspecified cirrhosis of liver: Secondary | ICD-10-CM | POA: Insufficient documentation

## 2012-05-14 LAB — BODY FLUID CELL COUNT WITH DIFFERENTIAL
Eos, Fluid: 1 %
Lymphs, Fluid: 28 %
Neutrophil Count, Fluid: 9 % (ref 0–25)

## 2012-05-14 LAB — PROTEIN, BODY FLUID: Total protein, fluid: 0.8 g/dL

## 2012-05-14 MED ORDER — ALBUMIN HUMAN 25 % IV SOLN
INTRAVENOUS | Status: AC
  Filled 2012-05-14: qty 200

## 2012-05-14 MED ORDER — ALBUMIN HUMAN 25 % IV SOLN: 50.0000 g | Freq: Once | INTRAVENOUS | Status: DC

## 2012-05-14 NOTE — Patient Instructions (Addendum)
US abdomen. Possible paracentesis.

## 2012-05-14 NOTE — Progress Notes (Signed)
Subjective:     Patient ID: [name], female   DOB: [birth year], 52 y.o.   MRN: [id number]  HPI She c/o today of abdominal distention. She tells me she just does not feel good and is tired.  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   She see hepatologist Dr. Durward Parcel of [facility].   Colonoscopy 10/21/2011: tubular adenoma and she also had AV malformations and portal colopathy. She has undergone esophageal variceal banding for primary prophylaxis at [facility]. Suspect her iron deficiency anemia may be due to occult bleed or impaired iron absorption.  She is to receive an iron infusion today for iron deficiency anemia.     Last seen at [facility] for re-evaluation of HCV and cirrhosis March the 20th, 2014. Weight  156. Today her weight is 159.5lbs.   04/26/2012 ALT 29, AST 28, ALP 97, Total bili 0.7, Creatinine 0.9, BUN 12, K 3.5, CL 103, INR 1.4, WBC 2.94, RBC 3.58, H and H 8.6 and 27.4, MCV 76.3, Platelet ct 52 Ferritin 17, Transferrin sat 6%. AFP 3.4  Meld Score 10  04/2012 Imaging 1. Morphological changes of cirrhosis. Apparent mass like area seen in the rt hepatic lobe favors to represent a regenerative nodule or a pseudo lesion due to heterogeneity. 2. Unchanged focal contour deforming mass in hepatic segment 5. This favors to represent a regenerative nodule. No discrete lesions to suggest [employer]. 3. Stigmata of portal hypertension, splenomegaly, upper abdominal ascites and multiple venous collaterals.  Hepatic Doppler: 1. 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 2. 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 3. Slow flow seen in the rt portal vein but with appropriate direction. No intra or extrahepatic biliary ductal dilatation. The common bile duct measures 9mm. Gallbladder surgical absent.  Review of Systems     Objective:   Physical Exam There were no vitals  filed for this visit.Danley Danker Vitals:   05/14/12 1345  BP: 122/60  Pulse: 88  Temp: 98.6 F (37 C)  Height: 5\' 6"  (1.676 m)  Weight: 159 lb 8 oz (72.349 kg)  Alert and oriented. Skin warm and dry. Oral mucosa is moist.   . Sclera anicteric, conjunctivae is pink. Thyroid not enlarged. No cervical lymphadenopathy. Lungs clear. Heart regular rate and rhythm.  Abdomen is soft, appears distended, but soft.. Bowel sounds are positive. No hepatomegaly. Spleen is palpable. No tenderness.  No edema to lower extremities.   Abdomen is somewhat tense when standing.        Assessment:    Hepatic C: awaiting transplant. Possible ascites. Her abdomen is soft today.     Plan:    US abdomen with possible paracentesis with fluid studies. She will receive her iron transfusion after [place]

## 2012-05-14 NOTE — Progress Notes (Signed)
Lidocaine 1%            52mL injected                        2617mL abdominal fluid removed           IV started in right arm with 20G angiocath       Albumin 50g administered via IV

## 2012-05-14 NOTE — Procedures (Signed)
Clinical Data: Cirrhosis.  Recurrent ascites.] ULTRASOUND GUIDED PARACENTESIS Comparison:  CT 12/06/2011 An ultrasound guided paracentesis was thoroughly discussed with the patient and questions answered.  The benefits, risks, alternatives and complications were also discussed.  The patient understands and wishes to proceed with the procedure.  Written consent was obtained.  Ultrasound was performed to localize and mark an adequate pocket of fluid in the right lower quadrant of the abdomen.  The area was then prepped and draped in the normal sterile fashion.  1% Lidocaine was used for local anesthesia.  Under ultrasound guidance a 19 gauge Yueh catheter was introduced.  Paracentesis was performed.  The catheter was removed and a dressing applied. Complications: None Findings:  A total of approximately 2600 cc of clear/yello fluid was removed.  A fluid sample was sent for laboratory analysis.   IMPRESSION: Successful ultrasound guided paracentesis yielding 2600 cc of ascites.

## 2012-05-15 ENCOUNTER — Telehealth (INDEPENDENT_AMBULATORY_CARE_PROVIDER_SITE_OTHER): Payer: Self-pay | Admitting: Internal Medicine

## 2012-05-15 ENCOUNTER — Encounter (HOSPITAL_COMMUNITY): Payer: BC Managed Care – PPO | Attending: Internal Medicine

## 2012-05-15 VITALS — BP 142/77

## 2012-05-15 DIAGNOSIS — D649 Anemia, unspecified: Secondary | ICD-10-CM | POA: Insufficient documentation

## 2012-05-15 LAB — PATHOLOGIST SMEAR REVIEW: Path Review: REACTIVE

## 2012-05-15 MED ORDER — FERUMOXYTOL INJECTION 510 MG/17 ML
510.0000 mg | Freq: Once | INTRAVENOUS | Status: AC
  Administered 2012-05-15: 510 mg via INTRAVENOUS
  Filled 2012-05-15: qty 17

## 2012-05-15 NOTE — Telephone Encounter (Signed)
Opened in error

## 2012-05-15 NOTE — Progress Notes (Signed)
IVP complete, patient tolerated well.

## 2012-05-18 LAB — BODY FLUID CULTURE

## 2012-05-19 LAB — ANAEROBIC CULTURE

## 2012-05-22 ENCOUNTER — Encounter (HOSPITAL_COMMUNITY): Payer: BC Managed Care – PPO

## 2012-05-22 VITALS — BP 128/76 | HR 78 | Temp 98.3°F | Resp 16

## 2012-05-22 DIAGNOSIS — D649 Anemia, unspecified: Secondary | ICD-10-CM

## 2012-05-22 MED ORDER — FERUMOXYTOL INJECTION 510 MG/17 ML
510.0000 mg | Freq: Once | INTRAVENOUS | Status: AC
  Administered 2012-05-22: 510 mg via INTRAVENOUS
  Filled 2012-05-22: qty 17

## 2012-05-22 MED ORDER — SODIUM CHLORIDE 0.9 % IJ SOLN
10.0000 mL | INTRAMUSCULAR | Status: DC | PRN
  Administered 2012-05-22: 10 mL via INTRAVENOUS

## 2012-05-22 NOTE — Progress Notes (Signed)
Fereheme 510 mg given iv to left arm, tolerated well.

## 2012-05-29 ENCOUNTER — Encounter (HOSPITAL_COMMUNITY): Payer: BC Managed Care – PPO

## 2012-05-29 VITALS — BP 122/66 | HR 89 | Temp 98.5°F | Resp 18

## 2012-05-29 DIAGNOSIS — D649 Anemia, unspecified: Secondary | ICD-10-CM

## 2012-05-29 MED ORDER — FERUMOXYTOL INJECTION 510 MG/17 ML
510.0000 mg | Freq: Once | INTRAVENOUS | Status: AC
  Administered 2012-05-29: 510 mg via INTRAVENOUS
  Filled 2012-05-29: qty 17

## 2012-05-29 MED ORDER — SODIUM CHLORIDE 0.9 % IV SOLN
Freq: Once | INTRAVENOUS | Status: AC
  Administered 2012-05-29: 14:00:00 via INTRAVENOUS

## 2012-05-29 NOTE — Progress Notes (Signed)
[name] presents today for injection per MD orders. feraheme 510 mg administered iv  in left antecubital with free flowing IV saline. Administration without incident. Patient tolerated well.

## 2012-06-05 ENCOUNTER — Ambulatory Visit (HOSPITAL_COMMUNITY): Payer: BC Managed Care – PPO

## 2012-06-05 ENCOUNTER — Emergency Department (HOSPITAL_COMMUNITY): Payer: BC Managed Care – PPO

## 2012-06-05 ENCOUNTER — Encounter (HOSPITAL_COMMUNITY): Payer: Self-pay

## 2012-06-05 ENCOUNTER — Inpatient Hospital Stay (HOSPITAL_COMMUNITY)
Admission: EM | Admit: 2012-06-05 | Discharge: 2012-06-08 | DRG: 543 | Disposition: A | Discharge status: Home / Self Care | Payer: BC Managed Care – PPO | Attending: Internal Medicine | Admitting: Internal Medicine

## 2012-06-05 DIAGNOSIS — D6959 Other secondary thrombocytopenia: Secondary | ICD-10-CM | POA: Diagnosis present

## 2012-06-05 DIAGNOSIS — R7881 Bacteremia: Secondary | ICD-10-CM

## 2012-06-05 DIAGNOSIS — R188 Other ascites: Secondary | ICD-10-CM | POA: Diagnosis present

## 2012-06-05 DIAGNOSIS — I748 Embolism and thrombosis of other arteries: Principal | ICD-10-CM | POA: Diagnosis present

## 2012-06-05 DIAGNOSIS — R109 Unspecified abdominal pain: Secondary | ICD-10-CM

## 2012-06-05 DIAGNOSIS — Z9884 Bariatric surgery status: Secondary | ICD-10-CM

## 2012-06-05 DIAGNOSIS — R161 Splenomegaly, not elsewhere classified: Secondary | ICD-10-CM | POA: Diagnosis present

## 2012-06-05 DIAGNOSIS — Z833 Family history of diabetes mellitus: Secondary | ICD-10-CM

## 2012-06-05 DIAGNOSIS — K7290 Hepatic failure, unspecified without coma: Secondary | ICD-10-CM

## 2012-06-05 DIAGNOSIS — I81 Portal vein thrombosis: Secondary | ICD-10-CM | POA: Diagnosis present

## 2012-06-05 DIAGNOSIS — K7682 Hepatic encephalopathy: Secondary | ICD-10-CM | POA: Diagnosis present

## 2012-06-05 DIAGNOSIS — E876 Hypokalemia: Secondary | ICD-10-CM | POA: Diagnosis present

## 2012-06-05 DIAGNOSIS — D696 Thrombocytopenia, unspecified: Secondary | ICD-10-CM | POA: Diagnosis present

## 2012-06-05 DIAGNOSIS — D649 Anemia, unspecified: Secondary | ICD-10-CM

## 2012-06-05 DIAGNOSIS — D62 Acute posthemorrhagic anemia: Secondary | ICD-10-CM | POA: Diagnosis present

## 2012-06-05 DIAGNOSIS — N1330 Unspecified hydronephrosis: Secondary | ICD-10-CM

## 2012-06-05 DIAGNOSIS — R739 Hyperglycemia, unspecified: Secondary | ICD-10-CM | POA: Diagnosis present

## 2012-06-05 DIAGNOSIS — N12 Tubulo-interstitial nephritis, not specified as acute or chronic: Secondary | ICD-10-CM

## 2012-06-05 DIAGNOSIS — R1012 Left upper quadrant pain: Secondary | ICD-10-CM | POA: Diagnosis present

## 2012-06-05 DIAGNOSIS — B1921 Unspecified viral hepatitis C with hepatic coma: Secondary | ICD-10-CM | POA: Diagnosis present

## 2012-06-05 DIAGNOSIS — R7309 Other abnormal glucose: Secondary | ICD-10-CM | POA: Diagnosis present

## 2012-06-05 DIAGNOSIS — K7460 Unspecified cirrhosis of liver: Secondary | ICD-10-CM | POA: Diagnosis present

## 2012-06-05 DIAGNOSIS — Z87891 Personal history of nicotine dependence: Secondary | ICD-10-CM

## 2012-06-05 DIAGNOSIS — N201 Calculus of ureter: Secondary | ICD-10-CM

## 2012-06-05 DIAGNOSIS — D509 Iron deficiency anemia, unspecified: Secondary | ICD-10-CM | POA: Diagnosis present

## 2012-06-05 DIAGNOSIS — Z8249 Family history of ischemic heart disease and other diseases of the circulatory system: Secondary | ICD-10-CM

## 2012-06-05 LAB — CBC WITH DIFFERENTIAL/PLATELET
Basophils Absolute: 0 10*3/uL (ref 0.0–0.1)
Eosinophils Absolute: 0.1 10*3/uL (ref 0.0–0.7)
Lymphs Abs: 0.5 10*3/uL — ABNORMAL LOW (ref 0.7–4.0)
MCH: 27.3 pg (ref 26.0–34.0)
MCHC: 33.2 g/dL (ref 30.0–36.0)
MCV: 82.2 fL (ref 78.0–100.0)
Monocytes Absolute: 0.2 10*3/uL (ref 0.1–1.0)
Neutrophils Relative %: 77 % (ref 43–77)
Platelets: 47 10*3/uL — ABNORMAL LOW (ref 150–400)
RBC: 3.99 MIL/uL (ref 3.87–5.11)
RDW: 21.6 % — ABNORMAL HIGH (ref 11.5–15.5)

## 2012-06-05 LAB — URINALYSIS, ROUTINE W REFLEX MICROSCOPIC
Glucose, UA: 250 mg/dL — AB
Hgb urine dipstick: NEGATIVE
Leukocytes, UA: NEGATIVE
Protein, ur: NEGATIVE mg/dL
Specific Gravity, Urine: 1.02 (ref 1.005–1.030)
Urobilinogen, UA: 1 mg/dL (ref 0.0–1.0)

## 2012-06-05 LAB — COMPREHENSIVE METABOLIC PANEL
AST: 45 U/L — ABNORMAL HIGH (ref 0–37)
Albumin: 3.5 g/dL (ref 3.5–5.2)
BUN: 16 mg/dL (ref 6–23)
Calcium: 8.9 mg/dL (ref 8.4–10.5)
Creatinine, Ser: 0.75 mg/dL (ref 0.50–1.10)

## 2012-06-05 LAB — LIPASE, BLOOD: Lipase: 24 U/L (ref 11–59)

## 2012-06-05 LAB — PROTIME-INR
INR: 1.42 (ref 0.00–1.49)
Prothrombin Time: 17 seconds — ABNORMAL HIGH (ref 11.6–15.2)

## 2012-06-05 MED ORDER — IOHEXOL 300 MG/ML  SOLN
100.0000 mL | Freq: Once | INTRAMUSCULAR | Status: AC | PRN
  Administered 2012-06-05: 100 mL via INTRAVENOUS

## 2012-06-05 MED ORDER — ONDANSETRON HCL 4 MG/2ML IJ SOLN
4.0000 mg | Freq: Once | INTRAMUSCULAR | Status: AC
  Administered 2012-06-05: 4 mg via INTRAVENOUS
  Filled 2012-06-05: qty 2

## 2012-06-05 MED ORDER — MULTIVITAMINS PO CAPS: 1.0000 | ORAL_CAPSULE | Freq: Every day | ORAL | Status: DC

## 2012-06-05 MED ORDER — IOHEXOL 300 MG/ML  SOLN
50.0000 mL | Freq: Once | INTRAMUSCULAR | Status: AC | PRN
  Administered 2012-06-05: 50 mL via ORAL

## 2012-06-05 MED ORDER — HYDROMORPHONE HCL PF 1 MG/ML IJ SOLN
1.0000 mg | INTRAMUSCULAR | Status: DC | PRN
  Administered 2012-06-06 (×3): 1 mg via INTRAVENOUS
  Filled 2012-06-05 (×3): qty 1

## 2012-06-05 MED ORDER — ALBUTEROL SULFATE (5 MG/ML) 0.5% IN NEBU: 2.5000 mg | INHALATION_SOLUTION | RESPIRATORY_TRACT | Status: DC | PRN

## 2012-06-05 MED ORDER — HYDROMORPHONE HCL PF 1 MG/ML IJ SOLN
1.0000 mg | Freq: Once | INTRAMUSCULAR | Status: AC
  Administered 2012-06-05: 1 mg via INTRAVENOUS
  Filled 2012-06-05: qty 1

## 2012-06-05 MED ORDER — SODIUM CHLORIDE 0.9 % IJ SOLN
3.0000 mL | Freq: Two times a day (BID) | INTRAMUSCULAR | Status: DC
  Administered 2012-06-07: 3 mL via INTRAVENOUS

## 2012-06-05 MED ORDER — ONDANSETRON HCL 4 MG PO TABS: 4.0000 mg | ORAL_TABLET | Freq: Four times a day (QID) | ORAL | Status: DC | PRN

## 2012-06-05 MED ORDER — POTASSIUM CHLORIDE CRYS ER 20 MEQ PO TBCR
40.0000 meq | EXTENDED_RELEASE_TABLET | Freq: Once | ORAL | Status: AC
  Administered 2012-06-05: 40 meq via ORAL
  Filled 2012-06-05: qty 2

## 2012-06-05 MED ORDER — FUROSEMIDE 40 MG PO TABS
40.0000 mg | ORAL_TABLET | Freq: Every morning | ORAL | Status: DC
  Administered 2012-06-06 – 2012-06-08 (×3): 40 mg via ORAL
  Filled 2012-06-05 (×3): qty 1

## 2012-06-05 MED ORDER — RIFAXIMIN 550 MG PO TABS
550.0000 mg | ORAL_TABLET | Freq: Two times a day (BID) | ORAL | Status: DC
  Administered 2012-06-05 – 2012-06-08 (×6): 550 mg via ORAL
  Filled 2012-06-05 (×12): qty 1

## 2012-06-05 MED ORDER — SODIUM CHLORIDE 0.9 % IJ SOLN
3.0000 mL | Freq: Two times a day (BID) | INTRAMUSCULAR | Status: DC
  Administered 2012-06-05 – 2012-06-07 (×3): 3 mL via INTRAVENOUS

## 2012-06-05 MED ORDER — ONDANSETRON HCL 4 MG/2ML IJ SOLN: 4.0000 mg | Freq: Four times a day (QID) | INTRAMUSCULAR | Status: DC | PRN

## 2012-06-05 MED ORDER — B COMPLEX PO TABS
1.0000 | ORAL_TABLET | Freq: Every day | ORAL | Status: DC
  Filled 2012-06-05: qty 1

## 2012-06-05 MED ORDER — SODIUM CHLORIDE 0.9 % IV SOLN: 250.0000 mL | INTRAVENOUS | Status: DC | PRN

## 2012-06-05 MED ORDER — SODIUM CHLORIDE 0.9 % IJ SOLN: 3.0000 mL | INTRAMUSCULAR | Status: DC | PRN

## 2012-06-05 MED ORDER — NADOLOL 20 MG PO TABS
10.0000 mg | ORAL_TABLET | Freq: Every morning | ORAL | Status: DC
  Administered 2012-06-06 – 2012-06-08 (×2): 10 mg via ORAL
  Filled 2012-06-05 (×6): qty 1

## 2012-06-05 MED ORDER — ADULT MULTIVITAMIN W/MINERALS CH
1.0000 | ORAL_TABLET | Freq: Every day | ORAL | Status: DC
  Administered 2012-06-06 – 2012-06-08 (×3): 1 via ORAL
  Filled 2012-06-05 (×3): qty 1

## 2012-06-05 MED ORDER — PANTOPRAZOLE SODIUM 40 MG PO TBEC
40.0000 mg | DELAYED_RELEASE_TABLET | Freq: Two times a day (BID) | ORAL | Status: DC
  Administered 2012-06-06 – 2012-06-08 (×5): 40 mg via ORAL
  Filled 2012-06-05 (×5): qty 1

## 2012-06-05 MED ORDER — SPIRONOLACTONE 100 MG PO TABS
300.0000 mg | ORAL_TABLET | Freq: Every morning | ORAL | Status: DC
  Administered 2012-06-06 – 2012-06-08 (×3): 300 mg via ORAL
  Filled 2012-06-05 (×6): qty 3

## 2012-06-05 MED ORDER — HYDROMORPHONE HCL PF 2 MG/ML IJ SOLN
2.0000 mg | Freq: Once | INTRAMUSCULAR | Status: AC
  Administered 2012-06-05: 2 mg via INTRAVENOUS
  Filled 2012-06-05: qty 1

## 2012-06-05 MED ORDER — TRAZODONE HCL 50 MG PO TABS
50.0000 mg | ORAL_TABLET | Freq: Every day | ORAL | Status: DC
  Administered 2012-06-05 – 2012-06-06 (×2): 50 mg via ORAL
  Filled 2012-06-05 (×2): qty 1

## 2012-06-05 MED ORDER — RIFAXIMIN 550 MG PO TABS
ORAL_TABLET | ORAL | Status: AC
  Filled 2012-06-05: qty 1

## 2012-06-05 MED ORDER — SODIUM CHLORIDE 0.9 % IV SOLN
Freq: Once | INTRAVENOUS | Status: AC
  Administered 2012-06-05: 15:00:00 via INTRAVENOUS

## 2012-06-05 MED ORDER — POLYETHYLENE GLYCOL 3350 17 G PO PACK: 17.0000 g | PACK | Freq: Every day | ORAL | Status: DC | PRN

## 2012-06-05 NOTE — ED Notes (Signed)
Pt reports is on liver transplant list.  Reports had fluid removed from abd approx 3 weeks ago.  Pt c/o severe  abd pain and distention since 4 am today.  Denies any n/v/d.

## 2012-06-05 NOTE — ED Provider Notes (Signed)
History  This chart was scribed for Mylinda Latina III, MD by Roe Coombs, ED Scribe. The patient was seen in room APA09/APA09. Patient's care was started at 1255.   CSN: [id number]  Arrival date & time 06/05/12  1255   First MD Initiated Contact with Patient 06/05/12 1418      Chief Complaint  Patient presents with  . Abdominal Pain     The history is provided by the patient. No language interpreter was used.    HPI Comments: [name] is a 52 y.o. female with history of cirrhosis, esophageal varices, ascites, anemia and kidney stones who presents to the Emergency Department complaining of sudden, constant, moderate to severe LUQ abdominal pain onset 10.5 hours ago. She states that current pain is different from prior and that typical abdominal pain is on the right side. She says that she had some chest pain last night but is not having this pain presently. Patient does not smoke, use drugs or drink alcohol. Patient denies fever, ear pain, sore throat, cough, vomiting, diarrhea, difficulty urinating, discolored urine, rash, lightheadedness, syncope or seizure activity. She states that Dr. Olevia Perches office advised patient to come to the ED for more extensive evaluation. Patient says that she also had to inform her transplant coordinator of her pain. She states that she had about 3 L of fluid removed from her abdominal during a therapeutic paracentesis procedure about 3 weeks ago She has also been receiving blood transfusions and had one scheduled today. She has a surgical history of Cesarean section, lithotripsy x3 on the right, orthoscopic knee surgery, cholecystectomy, hernia repair x4, abdominal hysterectomy, endoscopy x2 each year and colonoscopy.   Gastroenterologist - Rehmam  Past Medical History  Diagnosis Date  . Esophageal varices   . Cirrhosis     non alcoholic  . Ascites   . Anemia   . Kidney stones     Past Surgical History  Procedure Laterality Date  . Abdominal  hysterectomy    . Cholecystectomy    . Upper gastrointestinal endoscopy    . Colonoscopy    . Hernia    . Gastric bypass    . Cesarean section    . Hernia repair  04/14/11    Patient states that she hs had 4 total, with most recent  being 04/14/11.  . Lithotripsy    . Knee arthroscopy      Right knee  . Colonoscopy  10/21/2011    Procedure: COLONOSCOPY;  Surgeon: Rogene Houston, MD;  Location: [facility];  Service: Endoscopy;  Laterality: N/A;  245   . Cystoscopy with stent placement  12/08/2011    Procedure: CYSTOSCOPY WITH STENT PLACEMENT;  Surgeon: Marissa Nestle, MD;  Location: [facility];  Service: Urology;  Laterality: Right;  . Cystoscopy w/ retrogrades  12/08/2011    Procedure: CYSTOSCOPY WITH RETROGRADE PYELOGRAM;  Surgeon: Marissa Nestle, MD;  Location: [facility];  Service: Urology;  Laterality: Right;    Family History  Problem Relation Age of Onset  . Dementia Mother   . Heart disease Father   . Liver disease Father   . Hypertension Father   . Diabetes Father   . Dementia Father   . Hypertension Brother     History  Substance Use Topics  . Smoking status: Former Smoker    Types: Cigarettes    Quit date: 05/02/1991  . Smokeless tobacco: Never Used  . Alcohol Use: No    OB History   Grav Para  Term Preterm Abortions TAB SAB Ect Mult Living                  Review of Systems  Constitutional: Negative for fever.  HENT: Negative for ear pain and sore throat.   Respiratory: Negative for cough.   Cardiovascular: Positive for chest pain.  Gastrointestinal: Positive for abdominal pain. Negative for nausea, vomiting and diarrhea.  Genitourinary: Negative.   Skin: Negative for rash.  Neurological: Negative for syncope and light-headedness.  All other systems reviewed and are negative.    Allergies  Ancef  Home Medications   Current Outpatient Rx  Name  Route  Sig  Dispense  Refill  . b complex vitamins tablet   Oral   Take 1 tablet by mouth  daily.         . furosemide (LASIX) 40 MG tablet   Oral   Take 40 mg by mouth daily.          . Multiple Vitamin (MULTIVITAMIN) capsule   Oral   Take 1 capsule by mouth daily.         . nadolol (CORGARD) 20 MG tablet   Oral   Take 10 mg by mouth daily.         Marland Kitchen omeprazole (PRILOSEC) 20 MG capsule   Oral   Take 20 mg by mouth Twice daily.         . polyethylene glycol (MIRALAX / GLYCOLAX) packet   Oral   Take 17 g by mouth daily as needed. Constipation         . rifaximin (XIFAXAN) 550 MG TABS   Oral   Take 550 mg by mouth 2 (two) times daily.         Marland Kitchen spironolactone (ALDACTONE) 100 MG tablet   Oral   Take 300 mg by mouth daily.         . traZODone (DESYREL) 50 MG tablet   Oral   Take 50 mg by mouth at bedtime.           Triage Vitals: BP 128/67  Pulse 73  Temp(Src) 98.1 F (36.7 C) (Oral)  Resp 20  Ht 5\' 6"  (1.676 m)  Wt 143 lb (64.864 kg)  BMI 23.09 kg/m2  SpO2 100%  Physical Exam  Constitutional: She is oriented to person, place, and time. She appears well-developed and well-nourished.  HENT:  Head: Normocephalic and atraumatic.  Mouth/Throat: Oropharynx is clear and moist. No oropharyngeal exudate.  Moist mucous membranes.  Eyes: Conjunctivae and EOM are normal. Pupils are equal, round, and reactive to light. No scleral icterus.  Neck: Normal range of motion. Neck supple.  Cardiovascular: Normal rate, regular rhythm and normal heart sounds.   Pulmonary/Chest: Effort normal and breath sounds normal.  Abdominal: Soft. She exhibits distension and ascites.  Pain localized to LUQ, but did not appreciate significant tenderness.  Neurological: She is alert and oriented to person, place, and time.  Skin: Skin is warm.  Spider angiomas present on chest wall. No jaundice.    ED Course  Procedures (including critical care time) DIAGNOSTIC STUDIES: Oxygen Saturation is 100% on room air, norma by my interpretation.    COORDINATION OF  CARE: 2:35 PM- Patient informed of current plan for treatment and evaluation and agrees with plan at this time.  2:45 PM- Spoke with Dr. Laural Golden who confirmed patient's history. Advised to rule out ascites.   3:20 PM- US showed small  mount of ascitic fluid. Will order a CT for further  work up.  IV medication for pain and nausea ordered.  6:46 PM Results for orders placed during the hospital encounter of 06/05/12  CBC WITH DIFFERENTIAL      Result Value Range   WBC 3.7 (*) 4.0 - 10.5 K/uL   RBC 3.99  3.87 - 5.11 MIL/uL   Hemoglobin 10.9 (*) 12.0 - 15.0 g/dL   HCT 32.8 (*) 36.0 - 46.0 %   MCV 82.2  78.0 - 100.0 fL   MCH 27.3  26.0 - 34.0 pg   MCHC 33.2  30.0 - 36.0 g/dL   RDW 21.6 (*) 11.5 - 15.5 %   Platelets 47 (*) 150 - 400 K/uL   Neutrophils Relative 77  43 - 77 %   Lymphocytes Relative 14  12 - 46 %   Monocytes Relative 6  3 - 12 %   Eosinophils Relative 2  0 - 5 %   Basophils Relative 1  0 - 1 %   Neutro Abs 2.9  1.7 - 7.7 K/uL   Lymphs Abs 0.5 (*) 0.7 - 4.0 K/uL   Monocytes Absolute 0.2  0.1 - 1.0 K/uL   Eosinophils Absolute 0.1  0.0 - 0.7 K/uL   Basophils Absolute 0.0  0.0 - 0.1 K/uL   RBC Morphology SCHISTOCYTES PRESENT (2-5/hpf)     Smear Review LARGE PLATELETS PRESENT    COMPREHENSIVE METABOLIC PANEL      Result Value Range   Sodium 136  135 - 145 mEq/L   Potassium 3.1 (*) 3.5 - 5.1 mEq/L   Chloride 100  96 - 112 mEq/L   CO2 29  19 - 32 mEq/L   Glucose, Bld 213 (*) 70 - 99 mg/dL   BUN 16  6 - 23 mg/dL   Creatinine, Ser 0.75  0.50 - 1.10 mg/dL   Calcium 8.9  8.4 - 10.5 mg/dL   Total Protein 7.1  6.0 - 8.3 g/dL   Albumin 3.5  3.5 - 5.2 g/dL   AST 45 (*) 0 - 37 U/L   ALT 36 (*) 0 - 35 U/L   Alkaline Phosphatase 116  39 - 117 U/L   Total Bilirubin 0.9  0.3 - 1.2 mg/dL   GFR calc non Af Amer >90  >90 mL/min   GFR calc Af Amer >90  >90 mL/min  URINALYSIS, ROUTINE W REFLEX MICROSCOPIC      Result Value Range   Color, Urine YELLOW  YELLOW   APPearance CLEAR   CLEAR   Specific Gravity, Urine 1.020  1.005 - 1.030   pH 6.0  5.0 - 8.0   Glucose, UA 250 (*) NEGATIVE mg/dL   Hgb urine dipstick NEGATIVE  NEGATIVE   Bilirubin Urine NEGATIVE  NEGATIVE   Ketones, ur NEGATIVE  NEGATIVE mg/dL   Protein, ur NEGATIVE  NEGATIVE mg/dL   Urobilinogen, UA 1.0  0.0 - 1.0 mg/dL   Nitrite NEGATIVE  NEGATIVE   Leukocytes, UA NEGATIVE  NEGATIVE  PROTIME-INR      Result Value Range   Prothrombin Time 17.0 (*) 11.6 - 15.2 seconds   INR 1.42  0.00 - 1.49  APTT      Result Value Range   aPTT 34  24 - 37 seconds  LIPASE, BLOOD      Result Value Range   Lipase 24  11 - 59 U/L   Ct Abdomen Pelvis W Contrast  06/05/2012  *RADIOLOGY REPORT*  Clinical Data: Left upper quadrant abdominal pain.  Abdominal distension.  CT ABDOMEN AND  PELVIS WITH CONTRAST  Technique:  Multidetector CT imaging of the abdomen and pelvis was performed following the standard protocol during bolus administration of intravenous contrast.  Contrast: 76mL OMNIPAQUE IOHEXOL 300 MG/ML  SOLN, 137mL OMNIPAQUE IOHEXOL 300 MG/ML  SOLN  Comparison: CT of the abdomen and pelvis 12/06/2011.  Findings:  Lung Bases: Unremarkable.  Abdomen/Pelvis:  The liver has a shrunken appearance and very nodular contour, compatible with cirrhosis.  There is some mild to moderate intrahepatic biliary ductal dilatation in the appearance of the intrahepatic bile ducts is somewhat irregular.  There is also likely a component of periportal edema.  Common bile duct is also dilated measuring up to 11 mm in the porta hepatis.  Status post cholecystectomy.  The pancreas is atrophic.  Marked splenomegaly, similar to the prior examination, currently measuring 18.3 x 10.0 x 16.9 cm. 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  Numerous large vessels are also noted along the lesser curvature of the stomach and around the gastroesophageal  junction, compatible with gastroesophageal varices.  The appearance of the adrenal glands is unremarkable. Left kidney is normal in appearance.  Multifocal parenchymal thinning of the right kidney likely represents scarring.  There are multiple calcifications in the right renal collecting system, compatible with nonobstructive calculi, largest of which measures up to 9 mm in the upper pole collecting system.  Postoperative changes of gastric bypass are noted.  Numerous surgical clips are seen throughout the upper abdomen.  There also appears to be some chronic curvilinear calcification anterior and superior to the spleen, which may reflect sequela of prior hemorrhage.  A small volume of ascites and diffuse mesenteric edema.  Apparent colonic wall thickening in the region of the cecum may simply reflect under distension of bowel, infection/inflammation, or underlying neoplasm.  No pneumoperitoneum.  No pathologic distension of small bowel.  There are a few scattered colonic diverticula.  Accurate assessment for inflammatory changes of diverticulitis is severely limited by the presence of diffuse mesenteric edema and small volume of ascites. Status post hysterectomy.  Ovaries are not confidently identified and may be surgically absent or atrophic.  Urinary bladder is unremarkable in appearance.  Laxity of the pelvic floor, suggesting pelvic organ prolapse.  Musculoskeletal: There are no aggressive appearing lytic or blastic lesions noted in the visualized portions of the skeleton.  IMPRESSION: 1.  Massive splenomegaly.  This is the only finding on today's examination that may account for left upper quadrant abdominal pain, however, this appears to be chronic as this is similar to the prior study. 2.  There does appear to be some irregular wall thickening in the region of the cecum.  Whether this is simply related to under distension of the bowel in this region, or is a real finding related to inflammation, infection  or neoplasm is uncertain. Clinical correlation is recommended.  Follow-up non emergent colonoscopy is suggested to exclude neoplasm if clinically appropriate. 3.  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  There are also gastroesophageal varices, as above. 4.  Mild to moderate intra and extrahepatic biliary ductal dilatation.  Irregularity of the intrahepatic bile ducts is noted. Correlation for signs and symptoms of cholangitis is suggested. 5.  Nonobstructive calculi in the right renal collecting system, largest of which measures up to 9 mm. 6.  Status post gastric bypass procedure. 7.  Additional incidental  findings, as above.   Original Report Authenticated By: Vinnie Langton, M.D.    US Abdomen Limited  06/05/2012  *RADIOLOGY REPORT*  Clinical Data: 52 year old female with cirrhosis and abdominal distention.  LIMITED ABDOMEN ULTRASOUND FOR ASCITES  Technique:  Limited ultrasound survey for ascites was performed in all four abdominal quadrants.  Comparison:  05/14/2012  Findings: A trace amount of ascites is identified within the right lower abdomen. Marked splenomegaly is identified with a splenic volume of 2150 ml.  IMPRESSION: Trace amount of ascites.  Marked splenomegaly again identified.   Original Report Authenticated By: Margarette Canada, M.D.     6:47 PM  Lab workup shows mild anemia, mildly elevated LFT's.  CT of abdomen/pelvis shows massively enlarged spleen.  Call to Dr. Laural Golden -->  He wants her to be admitted for observation for abdominal pain, ? Splenic infarct.  Will call [facility] to request her admission.  7:27 PM Case discussed with Bonnielee Haff, M.D., who will see pt.   1. Abdominal  pain, other specified site   2. Splenomegaly   3. Cirrhosis of liver       Mylinda Latina III, MD 06/05/12 [phone]

## 2012-06-05 NOTE — ED Notes (Signed)
Pt c/o left side abdomina pain since this morning. Pt states "this is the worst pain i've had". Pt states she is currently on liver transplant list at [facility] and has a hx of cirrhosis. Pt states she had "3 liters of fluid removed" from her abdomen 3 weeks ago. Pt denies any other symptoms at this time.

## 2012-06-05 NOTE — H&P (Signed)
[facility] History and Physical  [name] [id number] DOB: 25-Nov-1960 DOA: 06/05/2012   PCP: Glo Herring., MD  Specialists: She is followed by Dr. Laural Golden. She's also followed by providers at the [place] [facility] [place]  Chief Complaint: Left-sided abdominal pain since this morning  HPI: [name] is a 52 y.o. female with a past medical history of hepatitis C, liver cirrhosis, who was in her usual state of health till about 4 AM today, when she woke up with the abdominal pain in the left upper side. She described this as a stabbing sensation and was constant. Was 10 out of 10 in intensity. She's never had pain like this before. Movement increased the pain. There was no relieving factors. There was no radiation of the pain. There was no precipitating factors. She ate salmon for dinner last night. Denies any nausea, vomiting, or diarrhea. Denies any fever or chills. She underwent paracentesis on April 7. Cultures from that fluid analysis were negative. 3 L were removed. Denies any leg swelling. After receiving pain medications the pain is down to 7/10 in intensity. She denies having had pain like this in the past.  Home Medications: Prior to Admission medications   Medication Sig Start Date End Date Taking? Authorizing Provider  b complex vitamins tablet Take 1 tablet by mouth daily.   Yes Historical Provider, MD  furosemide (LASIX) 40 MG tablet Take 40 mg by mouth every morning.    Yes Historical Provider, MD  Multiple Vitamin (MULTIVITAMIN) capsule Take 1 capsule by mouth daily.   Yes Historical Provider, MD  nadolol (CORGARD) 20 MG tablet Take 10 mg by mouth every morning.    Yes Historical Provider, MD  omeprazole (PRILOSEC) 20 MG capsule Take 20 mg by mouth Twice daily. 11/15/11  Yes Historical Provider, MD  polyethylene glycol (MIRALAX / GLYCOLAX) packet Take 17 g by mouth daily as needed. Constipation   Yes Historical Provider, MD  rifaximin (XIFAXAN) 550 MG TABS Take 550  mg by mouth 2 (two) times daily.   Yes Historical Provider, MD  spironolactone (ALDACTONE) 100 MG tablet Take 300 mg by mouth every morning.    Yes Historical Provider, MD  traZODone (DESYREL) 50 MG tablet Take 50 mg by mouth at bedtime.   Yes Historical Provider, MD    Allergies:  Allergies  Allergen Reactions  . Ancef (Cefazolin Sodium)     Blisters    Past Medical History: Past Medical History  Diagnosis Date  . Esophageal varices   . Cirrhosis     non alcoholic  . Ascites   . Anemia   . Kidney stones     Past Surgical History  Procedure Laterality Date  . Abdominal hysterectomy    . Cholecystectomy    . Upper gastrointestinal endoscopy    . Colonoscopy    . Hernia    . Gastric bypass    . Cesarean section    . Hernia repair  04/14/11    Patient states that she hs had 4 total, with most recent  being 04/14/11.  . Lithotripsy    . Knee arthroscopy      Right knee  . Colonoscopy  10/21/2011    Procedure: COLONOSCOPY;  Surgeon: Rogene Houston, MD;  Location: [facility];  Service: Endoscopy;  Laterality: N/A;  245   . Cystoscopy with stent placement  12/08/2011    Procedure: CYSTOSCOPY WITH STENT PLACEMENT;  Surgeon: Marissa Nestle, MD;  Location: [facility];  Service: Urology;  Laterality: Right;  . Cystoscopy w/ retrogrades  12/08/2011    Procedure: CYSTOSCOPY WITH RETROGRADE PYELOGRAM;  Surgeon: Marissa Nestle, MD;  Location: [facility];  Service: Urology;  Laterality: Right;    Social History:  reports that she quit smoking about 21 years ago. Her smoking use included Cigarettes. She smoked 0.00 packs per day. She has never used smokeless tobacco. She reports that she does not drink alcohol or use illicit drugs.  Living Situation: She lives with her son Activity Level: Usually independent with daily activities   Family History:  Family History  Problem Relation Age of Onset  . Dementia Mother   . Heart disease Father   . Liver disease Father   .  Hypertension Father   . Diabetes Father   . Dementia Father   . Hypertension Brother      Review of Systems - History obtained from the patient General ROS: positive for  - fatigue Psychological ROS: negative Ophthalmic ROS: negative ENT ROS: negative Allergy and Immunology ROS: negative Hematological and Lymphatic ROS: negative Endocrine ROS: negative Respiratory ROS: no cough, shortness of breath, or wheezing Cardiovascular ROS: no chest pain or dyspnea on exertion Gastrointestinal ROS: as in hpi Genito-Urinary ROS: no dysuria, trouble voiding, or hematuria Musculoskeletal ROS: negative Neurological ROS: no TIA or stroke symptoms Dermatological ROS: negative  Physical Examination  Filed Vitals:   06/05/12 1330 06/05/12 1435 06/05/12 1818  BP: 128/67 119/67 105/51  Pulse: 73 73 76  Temp: 98.1 F (36.7 C)    TempSrc: Oral    Resp: 20 20 18   Height: 5\' 6"  (1.676 m)    Weight: 64.864 kg (143 lb)    SpO2: 100% 98% 100%    General appearance: alert, cooperative, appears stated age and no distress Head: Normocephalic, without obvious abnormality, atraumatic Eyes: conjunctivae/corneas clear. PERRL, EOM's intact.  Throat: lips, mucosa, and tongue normal; teeth and gums normal Neck: no adenopathy, no carotid bruit, no JVD, supple, symmetrical, trachea midline and thyroid not enlarged, symmetric, no tenderness/mass/nodules Resp: clear to auscultation bilaterally Cardio: regular rate and rhythm, S1, S2 normal. Systolic murmur at apex. No click, rub or gallop GI: Have is distended. Tenderness is present mostly in the left upper quadrant, without any rebound, rigidity, or guarding. There is dullness to percussion. Dullness to percussion also in the right upper quadrant. No masses appreciated. Spleen is palpable. Extremities: extremities normal, atraumatic, no cyanosis or edema Pulses: 2+ and symmetric Skin: Skin color, texture, turgor normal. No rashes or lesions Lymph nodes:  Cervical, supraclavicular, and axillary nodes normal. Neurologic: She is alert and oriented x3. No focal neurological deficits are present.  Laboratory Data: Results for orders placed during the hospital encounter of 06/05/12 (from the past 48 hour(s))  CBC WITH DIFFERENTIAL     Status: Abnormal   Collection Time    06/05/12  2:38 PM      Result Value Range   WBC 3.7 (*) 4.0 - 10.5 K/uL   RBC 3.99  3.87 - 5.11 MIL/uL   Hemoglobin 10.9 (*) 12.0 - 15.0 g/dL   HCT 32.8 (*) 36.0 - 46.0 %   MCV 82.2  78.0 - 100.0 fL   MCH 27.3  26.0 - 34.0 pg   MCHC 33.2  30.0 - 36.0 g/dL   RDW 21.6 (*) 11.5 - 15.5 %   Platelets 47 (*) 150 - 400 K/uL   Neutrophils Relative 77  43 - 77 %   Lymphocytes Relative 14  12 - 46 %  Monocytes Relative 6  3 - 12 %   Eosinophils Relative 2  0 - 5 %   Basophils Relative 1  0 - 1 %   Neutro Abs 2.9  1.7 - 7.7 K/uL   Lymphs Abs 0.5 (*) 0.7 - 4.0 K/uL   Monocytes Absolute 0.2  0.1 - 1.0 K/uL   Eosinophils Absolute 0.1  0.0 - 0.7 K/uL   Basophils Absolute 0.0  0.0 - 0.1 K/uL   RBC Morphology SCHISTOCYTES PRESENT (2-5/hpf)     Smear Review LARGE PLATELETS PRESENT     Comment: GIANT PLATELETS SEEN  COMPREHENSIVE METABOLIC PANEL     Status: Abnormal   Collection Time    06/05/12  2:38 PM      Result Value Range   Sodium 136  135 - 145 mEq/L   Potassium 3.1 (*) 3.5 - 5.1 mEq/L   Chloride 100  96 - 112 mEq/L   CO2 29  19 - 32 mEq/L   Glucose, Bld 213 (*) 70 - 99 mg/dL   BUN 16  6 - 23 mg/dL   Creatinine, Ser 0.75  0.50 - 1.10 mg/dL   Calcium 8.9  8.4 - 10.5 mg/dL   Total Protein 7.1  6.0 - 8.3 g/dL   Albumin 3.5  3.5 - 5.2 g/dL   AST 45 (*) 0 - 37 U/L   ALT 36 (*) 0 - 35 U/L   Alkaline Phosphatase 116  39 - 117 U/L   Total Bilirubin 0.9  0.3 - 1.2 mg/dL   GFR calc non Af Amer >90  >90 mL/min   GFR calc Af Amer >90  >90 mL/min   Comment:            The eGFR has been calculated     using the CKD EPI equation.     This calculation has not been      validated in all clinical     situations.     eGFR's persistently     <90 mL/min signify     possible Chronic Kidney Disease.  PROTIME-INR     Status: Abnormal   Collection Time    06/05/12  2:38 PM      Result Value Range   Prothrombin Time 17.0 (*) 11.6 - 15.2 seconds   INR 1.42  0.00 - 1.49  APTT     Status: None   Collection Time    06/05/12  2:38 PM      Result Value Range   aPTT 34  24 - 37 seconds  LIPASE, BLOOD     Status: None   Collection Time    06/05/12  2:38 PM      Result Value Range   Lipase 24  11 - 59 U/L  URINALYSIS, ROUTINE W REFLEX MICROSCOPIC     Status: Abnormal   Collection Time    06/05/12  2:50 PM      Result Value Range   Color, Urine YELLOW  YELLOW   APPearance CLEAR  CLEAR   Specific Gravity, Urine 1.020  1.005 - 1.030   pH 6.0  5.0 - 8.0   Glucose, UA 250 (*) NEGATIVE mg/dL   Hgb urine dipstick NEGATIVE  NEGATIVE   Bilirubin Urine NEGATIVE  NEGATIVE   Ketones, ur NEGATIVE  NEGATIVE mg/dL   Protein, ur NEGATIVE  NEGATIVE mg/dL   Urobilinogen, UA 1.0  0.0 - 1.0 mg/dL   Nitrite NEGATIVE  NEGATIVE   Leukocytes, UA NEGATIVE  NEGATIVE   Comment:  MICROSCOPIC NOT DONE ON URINES WITH NEGATIVE PROTEIN, BLOOD, LEUKOCYTES, NITRITE, OR GLUCOSE <1000 mg/dL.    Radiology Reports: Ct Abdomen Pelvis W Contrast  06/05/2012  *RADIOLOGY REPORT*  Clinical Data: Left upper quadrant abdominal pain.  Abdominal distension.  CT ABDOMEN AND PELVIS WITH CONTRAST  Technique:  Multidetector CT imaging of the abdomen and pelvis was performed following the standard protocol during bolus administration of intravenous contrast.  Contrast: 33mL OMNIPAQUE IOHEXOL 300 MG/ML  SOLN, 126mL OMNIPAQUE IOHEXOL 300 MG/ML  SOLN  Comparison: CT of the abdomen and pelvis 12/06/2011.  Findings:  Lung Bases: Unremarkable.  Abdomen/Pelvis:  The liver has a shrunken appearance and very nodular contour, compatible with cirrhosis.  There is some mild to moderate intrahepatic biliary ductal  dilatation in the appearance of the intrahepatic bile ducts is somewhat irregular.  There is also likely a component of periportal edema.  Common bile duct is also dilated measuring up to 11 mm in the porta hepatis.  Status post cholecystectomy.  The pancreas is atrophic.  Marked splenomegaly, similar to the prior examination, currently measuring 18.3 x 10.0 x 16.9 cm. 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  Numerous large vessels are also noted along the lesser curvature of the stomach and around the gastroesophageal junction, compatible with gastroesophageal varices.  The appearance of the adrenal glands is unremarkable. Left kidney is normal in appearance.  Multifocal parenchymal thinning of the right kidney likely represents scarring.  There are multiple calcifications in the right renal collecting system, compatible with nonobstructive calculi, largest of which measures up to 9 mm in the upper pole collecting system.  Postoperative changes of gastric bypass are noted.  Numerous surgical clips are seen throughout the upper abdomen.  There also appears to be some chronic curvilinear calcification anterior and superior to the spleen, which may reflect sequela of prior hemorrhage.  A small volume of ascites and diffuse mesenteric edema.  Apparent colonic wall thickening in the region of the cecum may simply reflect under distension of bowel, infection/inflammation, or underlying neoplasm.  No pneumoperitoneum.  No pathologic distension of small bowel.  There are a few scattered colonic diverticula.  Accurate assessment for inflammatory changes of diverticulitis is severely limited by the presence of diffuse mesenteric edema and small volume of ascites. Status post hysterectomy.  Ovaries are not confidently identified and may be surgically absent or atrophic.  Urinary bladder is unremarkable in appearance.   Laxity of the pelvic floor, suggesting pelvic organ prolapse.  Musculoskeletal: There are no aggressive appearing lytic or blastic lesions noted in the visualized portions of the skeleton.  IMPRESSION: 1.  Massive splenomegaly.  This is the only finding on today's examination that may account for left upper quadrant abdominal pain, however, this appears to be chronic as this is similar to the prior study. 2.  There does appear to be some irregular wall thickening in the region of the cecum.  Whether this is simply related to under distension of the bowel in this region, or is a real finding related to inflammation, infection or neoplasm is uncertain. Clinical correlation is recommended.  Follow-up non emergent colonoscopy is suggested to exclude neoplasm if clinically appropriate. 3.  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  There are also gastroesophageal varices, as above. 4.  Mild to moderate intra and extrahepatic biliary ductal dilatation.  Irregularity  of the intrahepatic bile ducts is noted. Correlation for signs and symptoms of cholangitis is suggested. 5.  Nonobstructive calculi in the right renal collecting system, largest of which measures up to 9 mm. 6.  Status post gastric bypass procedure. 7.  Additional incidental findings, as above.   Original Report Authenticated By: Vinnie Langton, M.D.    US Abdomen Limited  06/05/2012  *RADIOLOGY REPORT*  Clinical Data: 52 year old female with cirrhosis and abdominal distention.  LIMITED ABDOMEN ULTRASOUND FOR ASCITES  Technique:  Limited ultrasound survey for ascites was performed in all four abdominal quadrants.  Comparison:  05/14/2012  Findings: A trace amount of ascites is identified within the right lower abdomen. Marked splenomegaly is identified with a splenic volume of 2150 ml.  IMPRESSION: Trace amount of ascites.  Marked splenomegaly again  identified.   Original Report Authenticated By: Margarette Canada, M.D.     Problem List  Principal Problem:   LUQ abdominal pain Active Problems:   Cirrhosis of liver   Anemia   Thrombocytopenia   Assessment: This is a 53 year old, Caucasian female, who presents with left upper quadrant abdominal pain. She does have a splenomegaly, which is chronic. There is no evidence of splenic infarcts on the CT. She does have some thrombus in the portal vein, which is also chronic. No acute changes are really found on the CT scan. Etiology for her pain remains unclear. Lipase is normal.  Plan: #1 left upper quadrant abdominal pain: She only has small amount of ascites, even though her abdomen is distended. White blood cell count is normal. She is afebrile. I have discussed all of the above in detail  with Dr. Laural Golden, her gastroenterologist. He would like to observe her in the hospital overnight. We will medicate her with pain medicines. There is no clear indication to initiate antibiotics. If she starts developing a fever this can be considered. 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  #2 liver cirrhosis: Appears to be stable. She is on the transplant list at the [facility]. Continue with her home medications.  #3. 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 Platelet counts are stable. No anticoagulants to be used.  #4 anemia with a history of iron deficiency: She gets iron infusions. She was supposed to get an infusion today. However, could not keep the appointment because of her pain. Continue to monitor hemoglobin for now.  #5 hypokalemia: This will be repleted.  DVT Prophylaxis:  SCDs Code Status: She is a full code Family Communication: Discussed with the patient and her brother at bedside  Disposition Plan: Unclear for now   Further management will be deferred to her gastroenterologist.  Bonnielee Haff  [facility] Pager  [phone]  If 7PM-7AM, please contact night-coverage [email or web address] Password [facility]  06/05/2012, 7:56 PM

## 2012-06-06 DIAGNOSIS — R188 Other ascites: Secondary | ICD-10-CM

## 2012-06-06 DIAGNOSIS — K745 Biliary cirrhosis, unspecified: Secondary | ICD-10-CM

## 2012-06-06 DIAGNOSIS — R109 Unspecified abdominal pain: Secondary | ICD-10-CM

## 2012-06-06 DIAGNOSIS — R739 Hyperglycemia, unspecified: Secondary | ICD-10-CM | POA: Diagnosis present

## 2012-06-06 DIAGNOSIS — R1012 Left upper quadrant pain: Secondary | ICD-10-CM

## 2012-06-06 LAB — HEMOGLOBIN A1C
Hgb A1c MFr Bld: 5.9 % — ABNORMAL HIGH (ref <5.7)
Mean Plasma Glucose: 123 mg/dL — ABNORMAL HIGH (ref <117)

## 2012-06-06 LAB — COMPREHENSIVE METABOLIC PANEL
Alkaline Phosphatase: 185 U/L — ABNORMAL HIGH (ref 39–117)
BUN: 12 mg/dL (ref 6–23)
Calcium: 8.4 mg/dL (ref 8.4–10.5)
GFR calc Af Amer: >90 mL/min (ref >90)
Glucose, Bld: 166 mg/dL — ABNORMAL HIGH (ref 70–99)
Potassium: 3.8 mEq/L (ref 3.5–5.1)
Total Protein: 6.3 g/dL (ref 6.0–8.3)

## 2012-06-06 LAB — CBC
HCT: 30.5 % — ABNORMAL LOW (ref 36.0–46.0)
RDW: 21.3 % — ABNORMAL HIGH (ref 11.5–15.5)
WBC: 3.9 10*3/uL — ABNORMAL LOW (ref 4.0–10.5)

## 2012-06-06 LAB — GLUCOSE, CAPILLARY: Glucose-Capillary: 209 mg/dL — ABNORMAL HIGH (ref 70–99)

## 2012-06-06 MED ORDER — INSULIN ASPART 100 UNIT/ML ~~LOC~~ SOLN
0.0000 [IU] | Freq: Three times a day (TID) | SUBCUTANEOUS | Status: DC
  Administered 2012-06-06: 3 [IU] via SUBCUTANEOUS
  Administered 2012-06-07 (×2): 1 [IU] via SUBCUTANEOUS
  Administered 2012-06-07: 2 [IU] via SUBCUTANEOUS
  Administered 2012-06-08: 5 [IU] via SUBCUTANEOUS

## 2012-06-06 MED ORDER — B COMPLEX-C PO TABS
1.0000 | ORAL_TABLET | Freq: Every day | ORAL | Status: DC
  Administered 2012-06-06 – 2012-06-08 (×3): 1 via ORAL
  Filled 2012-06-06 (×6): qty 1

## 2012-06-06 NOTE — Consult Note (Signed)
Reason for Consult: Referring Physician:  Hospitalist  [name] is an 52 y.o. female.  HPI: Admitted thru the ED last night for left upper quadrant pain.   Hx of  Cirrhosis/splenomegaly. Hx of Hepatitis C and cleared the virus in 2007. She is seen at [facility] and presently on the transplant list.  She rates her pain 10/10.  She underwent a CT yesterday which revealed massive splenomegaly.  The pain started around 4pm and did not let up. The pain was sudden in onset.  Before her pain started she had cramps in her legs.  Hx of anemia and receives iron infusion. She was scheduled for an iron infusion yesterday but went to the ED instead.  Past Medical History  Diagnosis Date  . Esophageal varices   . Cirrhosis     non alcoholic  . Ascites   . Anemia   . Kidney stones     Past Surgical History  Procedure Laterality Date  . Abdominal hysterectomy    . Cholecystectomy    . Upper gastrointestinal endoscopy    . Colonoscopy    . Hernia    . Gastric bypass    . Cesarean section    . Hernia repair  04/14/11    Patient states that she hs had 4 total, with most recent  being 04/14/11.  . Lithotripsy    . Knee arthroscopy      Right knee  . Colonoscopy  10/21/2011    Procedure: COLONOSCOPY;  Surgeon: Rogene Houston, MD;  Location: [facility];  Service: Endoscopy;  Laterality: N/A;  245   . Cystoscopy with stent placement  12/08/2011    Procedure: CYSTOSCOPY WITH STENT PLACEMENT;  Surgeon: Marissa Nestle, MD;  Location: [facility] ORS;  Service: Urology;  Laterality: Right;  . Cystoscopy w/ retrogrades  12/08/2011    Procedure: CYSTOSCOPY WITH RETROGRADE PYELOGRAM;  Surgeon: Marissa Nestle, MD;  Location: [facility];  Service: Urology;  Laterality: Right;    Family History  Problem Relation Age of Onset  . Dementia Mother   . Heart disease Father   . Liver disease Father   . Hypertension Father   . Diabetes Father   . Dementia Father   . Hypertension Brother     Social History:   reports that she quit smoking about 21 years ago. Her smoking use included Cigarettes. She smoked 0.00 packs per day. She has never used smokeless tobacco. She reports that she does not drink alcohol or use illicit drugs.  Allergies:  Allergies  Allergen Reactions  . Ancef (Cefazolin Sodium)     Blisters    Medications: I have reviewed the patient's current medications.  Results for orders placed during the hospital encounter of 06/05/12 (from the past 48 hour(s))  CBC WITH DIFFERENTIAL     Status: Abnormal   Collection Time    06/05/12  2:38 PM      Result Value Range   WBC 3.7 (*) 4.0 - 10.5 K/uL   RBC 3.99  3.87 - 5.11 MIL/uL   Hemoglobin 10.9 (*) 12.0 - 15.0 g/dL   HCT 32.8 (*) 36.0 - 46.0 %   MCV 82.2  78.0 - 100.0 fL   MCH 27.3  26.0 - 34.0 pg   MCHC 33.2  30.0 - 36.0 g/dL   RDW 21.6 (*) 11.5 - 15.5 %   Platelets 47 (*) 150 - 400 K/uL   Neutrophils Relative 77  43 - 77 %   Lymphocytes Relative 14  12 - 46 %   Monocytes Relative 6  3 - 12 %   Eosinophils Relative 2  0 - 5 %   Basophils Relative 1  0 - 1 %   Neutro Abs 2.9  1.7 - 7.7 K/uL   Lymphs Abs 0.5 (*) 0.7 - 4.0 K/uL   Monocytes Absolute 0.2  0.1 - 1.0 K/uL   Eosinophils Absolute 0.1  0.0 - 0.7 K/uL   Basophils Absolute 0.0  0.0 - 0.1 K/uL   RBC Morphology SCHISTOCYTES PRESENT (2-5/hpf)     Smear Review LARGE PLATELETS PRESENT     Comment: GIANT PLATELETS SEEN  COMPREHENSIVE METABOLIC PANEL     Status: Abnormal   Collection Time    06/05/12  2:38 PM      Result Value Range   Sodium 136  135 - 145 mEq/L   Potassium 3.1 (*) 3.5 - 5.1 mEq/L   Chloride 100  96 - 112 mEq/L   CO2 29  19 - 32 mEq/L   Glucose, Bld 213 (*) 70 - 99 mg/dL   BUN 16  6 - 23 mg/dL   Creatinine, Ser 0.75  0.50 - 1.10 mg/dL   Calcium 8.9  8.4 - 10.5 mg/dL   Total Protein 7.1  6.0 - 8.3 g/dL   Albumin 3.5  3.5 - 5.2 g/dL   AST 45 (*) 0 - 37 U/L   ALT 36 (*) 0 - 35 U/L   Alkaline Phosphatase 116  39 - 117 U/L   Total Bilirubin 0.9  0.3  - 1.2 mg/dL   GFR calc non Af Amer >90  >90 mL/min   GFR calc Af Amer >90  >90 mL/min   Comment:            The eGFR has been calculated     using the CKD EPI equation.     This calculation has not been     validated in all clinical     situations.     eGFR's persistently     <90 mL/min signify     possible Chronic Kidney Disease.  PROTIME-INR     Status: Abnormal   Collection Time    06/05/12  2:38 PM      Result Value Range   Prothrombin Time 17.0 (*) 11.6 - 15.2 seconds   INR 1.42  0.00 - 1.49  APTT     Status: None   Collection Time    06/05/12  2:38 PM      Result Value Range   aPTT 34  24 - 37 seconds  LIPASE, BLOOD     Status: None   Collection Time    06/05/12  2:38 PM      Result Value Range   Lipase 24  11 - 59 U/L  URINALYSIS, ROUTINE W REFLEX MICROSCOPIC     Status: Abnormal   Collection Time    06/05/12  2:50 PM      Result Value Range   Color, Urine YELLOW  YELLOW   APPearance CLEAR  CLEAR   Specific Gravity, Urine 1.020  1.005 - 1.030   pH 6.0  5.0 - 8.0   Glucose, UA 250 (*) NEGATIVE mg/dL   Hgb urine dipstick NEGATIVE  NEGATIVE   Bilirubin Urine NEGATIVE  NEGATIVE   Ketones, ur NEGATIVE  NEGATIVE mg/dL   Protein, ur NEGATIVE  NEGATIVE mg/dL   Urobilinogen, UA 1.0  0.0 - 1.0 mg/dL   Nitrite NEGATIVE  NEGATIVE   Leukocytes, UA  NEGATIVE  NEGATIVE   Comment: MICROSCOPIC NOT DONE ON URINES WITH NEGATIVE PROTEIN, BLOOD, LEUKOCYTES, NITRITE, OR GLUCOSE <1000 mg/dL.  COMPREHENSIVE METABOLIC PANEL     Status: Abnormal   Collection Time    06/06/12  6:07 AM      Result Value Range   Sodium 137  135 - 145 mEq/L   Potassium 3.8  3.5 - 5.1 mEq/L   Comment: DELTA CHECK NOTED   Chloride 103  96 - 112 mEq/L   CO2 29  19 - 32 mEq/L   Glucose, Bld 166 (*) 70 - 99 mg/dL   BUN 12  6 - 23 mg/dL   Creatinine, Ser 0.71  0.50 - 1.10 mg/dL   Calcium 8.4  8.4 - 10.5 mg/dL   Total Protein 6.3  6.0 - 8.3 g/dL   Albumin 3.0 (*) 3.5 - 5.2 g/dL   AST 156 (*) 0 - 37 U/L    ALT 104 (*) 0 - 35 U/L   Alkaline Phosphatase 185 (*) 39 - 117 U/L   Total Bilirubin 1.7 (*) 0.3 - 1.2 mg/dL   GFR calc non Af Amer >90  >90 mL/min   GFR calc Af Amer >90  >90 mL/min   Comment:            The eGFR has been calculated     using the CKD EPI equation.     This calculation has not been     validated in all clinical     situations.     eGFR's persistently     <90 mL/min signify     possible Chronic Kidney Disease.  CBC     Status: Abnormal   Collection Time    06/06/12  6:07 AM      Result Value Range   WBC 3.9 (*) 4.0 - 10.5 K/uL   RBC 3.67 (*) 3.87 - 5.11 MIL/uL   Hemoglobin 10.0 (*) 12.0 - 15.0 g/dL   HCT 30.5 (*) 36.0 - 46.0 %   MCV 83.1  78.0 - 100.0 fL   MCH 27.2  26.0 - 34.0 pg   MCHC 32.8  30.0 - 36.0 g/dL   RDW 21.3 (*) 11.5 - 15.5 %   Platelets 39 (*) 150 - 400 K/uL    Ct Abdomen Pelvis W Contrast  06/05/2012  *RADIOLOGY REPORT*  Clinical Data: Left upper quadrant abdominal pain.  Abdominal distension.  CT ABDOMEN AND PELVIS WITH CONTRAST  Technique:  Multidetector CT imaging of the abdomen and pelvis was performed following the standard protocol during bolus administration of intravenous contrast.  Contrast: 33mL OMNIPAQUE IOHEXOL 300 MG/ML  SOLN, 166mL OMNIPAQUE IOHEXOL 300 MG/ML  SOLN  Comparison: CT of the abdomen and pelvis 12/06/2011.  Findings:  Lung Bases: Unremarkable.  Abdomen/Pelvis:  The liver has a shrunken appearance and very nodular contour, compatible with cirrhosis.  There is some mild to moderate intrahepatic biliary ductal dilatation in the appearance of the intrahepatic bile ducts is somewhat irregular.  There is also likely a component of periportal edema.  Common bile duct is also dilated measuring up to 11 mm in the porta hepatis.  Status post cholecystectomy.  The pancreas is atrophic.  Marked splenomegaly, similar to the prior examination, currently measuring 18.3 x 10.0 x 16.9 cm. Portal vein appears occluded, but there is cavernous  transformation in the porta hepatis allowing perfusion of the liver via collateral pathways.  Numerous large vessels are also noted along the lesser curvature of the stomach and around  the gastroesophageal junction, compatible with gastroesophageal varices.  The appearance of the adrenal glands is unremarkable. Left kidney is normal in appearance.  Multifocal parenchymal thinning of the right kidney likely represents scarring.  There are multiple calcifications in the right renal collecting system, compatible with nonobstructive calculi, largest of which measures up to 9 mm in the upper pole collecting system.  Postoperative changes of gastric bypass are noted.  Numerous surgical clips are seen throughout the upper abdomen.  There also appears to be some chronic curvilinear calcification anterior and superior to the spleen, which may reflect sequela of prior hemorrhage.  A small volume of ascites and diffuse mesenteric edema.  Apparent colonic wall thickening in the region of the cecum may simply reflect under distension of bowel, infection/inflammation, or underlying neoplasm.  No pneumoperitoneum.  No pathologic distension of small bowel.  There are a few scattered colonic diverticula.  Accurate assessment for inflammatory changes of diverticulitis is severely limited by the presence of diffuse mesenteric edema and small volume of ascites. Status post hysterectomy.  Ovaries are not confidently identified and may be surgically absent or atrophic.  Urinary bladder is unremarkable in appearance.  Laxity of the pelvic floor, suggesting pelvic organ prolapse.  Musculoskeletal: There are no aggressive appearing lytic or blastic lesions noted in the visualized portions of the skeleton.  IMPRESSION: 1.  Massive splenomegaly.  This is the only finding on today's examination that may account for left upper quadrant abdominal pain, however, this appears to be chronic as this is similar to the prior study. 2.  There does  appear to be some irregular wall thickening in the region of the cecum.  Whether this is simply related to under distension of the bowel in this region, or is a real finding related to inflammation, infection or neoplasm is uncertain. Clinical correlation is recommended.  Follow-up non emergent colonoscopy is suggested to exclude neoplasm if clinically appropriate. 3.  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  There are also gastroesophageal varices, as above. 4.  Mild to moderate intra and extrahepatic biliary ductal dilatation.  Irregularity of the intrahepatic bile ducts is noted. Correlation for signs and symptoms of cholangitis is suggested. 5.  Nonobstructive calculi in the right renal collecting system, largest of which measures up to 9 mm. 6.  Status post gastric bypass procedure. 7.  Additional incidental findings, as above.   Original Report Authenticated By: Vinnie Langton, M.D.    US Abdomen Limited  06/05/2012  *RADIOLOGY REPORT*  Clinical Data: 52 year old female with cirrhosis and abdominal distention.  LIMITED ABDOMEN ULTRASOUND FOR ASCITES  Technique:  Limited ultrasound survey for ascites was performed in all four abdominal quadrants.  Comparison:  05/14/2012  Findings: A trace amount of ascites is identified within the right lower abdomen. Marked splenomegaly is identified with a splenic volume of 2150 ml.  IMPRESSION: Trace amount of ascites.  Marked splenomegaly again identified.   Original Report Authenticated By: Margarette Canada, M.D.     ROS Blood pressure 103/63, pulse 80, temperature 97.4 F (36.3 C), temperature source Oral, resp. rate 19, height 5\' 6"  (1.676 m), weight 150 lb 2.1 oz (68.1 kg), SpO2 99.00%. Physical Exam Alert and oriented. Skin warm and dry. Oral mucosa is moist.   . Sclera anicteric, conjunctivae is pink. Thyroid not enlarged. No cervical lymphadenopathy. Lungs  clear. Heart regular rate and rhythm.  Abdomen is soft. Bowel sounds are positive. Abdomen is distended.  No abdominal masses felt. Spleen is enlarged. There is guarding.  No edema to lower extremities.    Assessment/Plan: Massive spenomegaly: possible infarct. She continues to have pain. I will discuss with Dr. Rosalin Hawking 06/06/2012, 8:52 AM   GI attending note; Patient interviewed and examined. Abdominopelvic CT films reviewed with Dr. Aletta Edouard. Suspect patient's acute pain secondary to splenic infarct although no changes seen on CT from last evening. No rub or bruit noted over spleen. She has small amount of perihepatic ascites therefore SBP is not in the differential diagnosis. Continue symptomatic therapy. If she does not improve in the next 24-48 hours we'll need to repeat CT with contrast. Dr. Gala Romney will provide GI assistance during my absence. I will see patient on 06/08/2012.

## 2012-06-06 NOTE — Progress Notes (Signed)
Inpatient Diabetes Program Recommendations  AACE/ADA: New Consensus Statement on Inpatient Glycemic Control (2013)  Target Ranges:  Prepandial:   less than 140 mg/dL      Peak postprandial:   less than 180 mg/dL (1-2 hours)      Critically ill patients:  140 - 180 mg/dL   Results for [name], [name] (MRN [id number]) as of 06/06/2012 08:31  Ref. Range 06/05/2012 14:38 06/06/2012 06:07  Glucose Latest Range: 70-99 mg/dL 213 (H) 166 (H)    Inpatient Diabetes Program Recommendations Correction (SSI): Please consider ordering CBGs ACHS with Novolog correction if needed. HgbA1C: Please consider ordering an A1C to determine glycemic control over the past 2-3 months.  Last A1C was 6.3% on 12-13-2009.  Note: Patient does not have a documented history of diabetes.  Initial lab glucose on 06/05/12 was 213 mg/dl at 14:38 and fasting blood glucose this morning was 166 mg/dl.  Please consider ordering an A1C to determine glycemic control over the past 2-3 months; last A1C was 6.3% on 12-13-2009.  Also, please consider ordering CBGs ACHS with Novolog correction scale if needed.  Will continue to follow.  Thanks, Barnie Alderman, RN, BSN, [place] [facility] Coordinator Inpatient Diabetes Program [phone]

## 2012-06-06 NOTE — Progress Notes (Signed)
[facility] PROGRESS NOTE  ELISSA LEFEVERS [id number] DOB: [birth year] DOA: 06/05/2012 PCP: Glo Herring., MD  Assessment/Plan: 1. left upper quadrant abdominal pain: No improvement this am.  She only has small amount of ascites, even though her abdomen is distended. Abdominal CT with massive splenomegaly. This is the only finding on examination that may account for left upper quadrant abdominal pain, however, this appears to be chronic as this is similar to the prior study. White count WNL and afebrile. If she starts developing a fever antibiotics can be considered. 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 GI consult requested.   #2 liver cirrhosis: AST, ALT and Alk Phos trending upward. CT yields mild to moderate intra and extrahepatic biliary ductal dilatation.Irregularity of the intrahepatic bile ducts is noted.  Correlation for signs and symptoms of cholangitis is suggested.  She is on the transplant list at the [facility]. Continue with her home medications. Will monitor.   #3. 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 Platelet counts are stable. No anticoagulants to be used.   #4 anemia with a history of iron deficiency: She gets iron infusions. She was supposed to get an infusion 06/05/12. Continue to monitor .  #5 hypokalemia: resolved this am.   #6. Hyperglycemia: no hx DM. Will check A1c and use SSI for glycemic control.    Code Status: full Family Communication:  Disposition Plan: Home when ready in day or two   Consultants:  GI  Procedures:  none  Antibiotics:  none  HPI/Subjective: Awake alert. Reports continued pain no better no worse. Adequate pain management.   Objective: Filed Vitals:   06/05/12 2030 06/05/12 2139 06/06/12 0422 06/06/12 0722  BP: 121/51 108/63 103/63   Pulse: 74 73 80   Temp:  95.2 F (35.1 C) 97.4 F (36.3 C)   TempSrc:  Rectal Oral   Resp: 18 18 19    Height:  5'  6" (1.676 m)    Weight:  68.1 kg (150 lb 2.1 oz)    SpO2: 99% 93% 99% 99%   No intake or output data in the 24 hours ending 06/06/12 1113 Filed Weights   06/05/12 1330 06/05/12 2139  Weight: 64.864 kg (143 lb) 68.1 kg (150 lb 2.1 oz)    Exam:   General:  Awake alert NAD  Cardiovascular: RRR +murmur No gallup no LE edema  Respiratory: normal effort BSCTAB no wheeze no rhonchi  Abdomen: distended but soft +BS very tender to light palpation LUQ. Non-tender on right.   Musculoskeletal: no clubbing no cyanosis   Data Reviewed: Basic Metabolic Panel:  Recent Labs Lab 06/05/12 1438 06/06/12 0607  NA 136 137  K 3.1* 3.8  CL 100 103  CO2 29 29  GLUCOSE 213* 166*  BUN 16 12  CREATININE 0.75 0.71  CALCIUM 8.9 8.4   Liver Function Tests:  Recent Labs Lab 06/05/12 1438 06/06/12 0607  AST 45* 156*  ALT 36* 104*  ALKPHOS 116 185*  BILITOT 0.9 1.7*  PROT 7.1 6.3  ALBUMIN 3.5 3.0*    Recent Labs Lab 06/05/12 1438  LIPASE 24   No results found for this basename: AMMONIA,  in the last 168 hours CBC:  Recent Labs Lab 06/05/12 1438 06/06/12 0607  WBC 3.7* 3.9*  NEUTROABS 2.9  --   HGB 10.9* 10.0*  HCT 32.8* 30.5*  MCV 82.2 83.1  PLT 47* 39*   Cardiac Enzymes: No results found for this basename: CKTOTAL, CKMB, CKMBINDEX,  TROPONINI,  in the last 168 hours BNP (last 3 results) No results found for this basename: PROBNP,  in the last 8760 hours CBG: No results found for this basename: GLUCAP,  in the last 168 hours  No results found for this or any previous visit (from the past 240 hour(s)).   Studies: Ct Abdomen Pelvis W Contrast  06/05/2012  *RADIOLOGY REPORT*  Clinical Data: Left upper quadrant abdominal pain.  Abdominal distension.  CT ABDOMEN AND PELVIS WITH CONTRAST  Technique:  Multidetector CT imaging of the abdomen and pelvis was performed following the standard protocol during bolus administration of intravenous contrast.  Contrast: 7mL OMNIPAQUE  IOHEXOL 300 MG/ML  SOLN, 156mL OMNIPAQUE IOHEXOL 300 MG/ML  SOLN  Comparison: CT of the abdomen and pelvis 12/06/2011.  Findings:  Lung Bases: Unremarkable.  Abdomen/Pelvis:  The liver has a shrunken appearance and very nodular contour, compatible with cirrhosis.  There is some mild to moderate intrahepatic biliary ductal dilatation in the appearance of the intrahepatic bile ducts is somewhat irregular.  There is also likely a component of periportal edema.  Common bile duct is also dilated measuring up to 11 mm in the porta hepatis.  Status post cholecystectomy.  The pancreas is atrophic.  Marked splenomegaly, similar to the prior examination, currently measuring 18.3 x 10.0 x 16.9 cm. 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  Numerous large vessels are also noted along the lesser curvature of the stomach and around the gastroesophageal junction, compatible with gastroesophageal varices.  The appearance of the adrenal glands is unremarkable. Left kidney is normal in appearance.  Multifocal parenchymal thinning of the right kidney likely represents scarring.  There are multiple calcifications in the right renal collecting system, compatible with nonobstructive calculi, largest of which measures up to 9 mm in the upper pole collecting system.  Postoperative changes of gastric bypass are noted.  Numerous surgical clips are seen throughout the upper abdomen.  There also appears to be some chronic curvilinear calcification anterior and superior to the spleen, which may reflect sequela of prior hemorrhage.  A small volume of ascites and diffuse mesenteric edema.  Apparent colonic wall thickening in the region of the cecum may simply reflect under distension of bowel, infection/inflammation, or underlying neoplasm.  No pneumoperitoneum.  No pathologic distension of small bowel.  There are a few scattered colonic  diverticula.  Accurate assessment for inflammatory changes of diverticulitis is severely limited by the presence of diffuse mesenteric edema and small volume of ascites. Status post hysterectomy.  Ovaries are not confidently identified and may be surgically absent or atrophic.  Urinary bladder is unremarkable in appearance.  Laxity of the pelvic floor, suggesting pelvic organ prolapse.  Musculoskeletal: There are no aggressive appearing lytic or blastic lesions noted in the visualized portions of the skeleton.  IMPRESSION: 1.  Massive splenomegaly.  This is the only finding on today's examination that may account for left upper quadrant abdominal pain, however, this appears to be chronic as this is similar to the prior study. 2.  There does appear to be some irregular wall thickening in the region of the cecum.  Whether this is simply related to under distension of the bowel in this region, or is a real finding related to inflammation, infection or neoplasm is uncertain. Clinical correlation is recommended.  Follow-up non emergent colonoscopy is suggested to exclude neoplasm if clinically appropriate. 3.  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  There are also gastroesophageal varices, as above. 4.  Mild to moderate intra and extrahepatic biliary ductal dilatation.  Irregularity of the intrahepatic bile ducts is noted. Correlation for signs and symptoms of cholangitis is suggested. 5.  Nonobstructive calculi in the right renal collecting system, largest of which measures up to 9 mm. 6.  Status post gastric bypass procedure. 7.  Additional incidental findings, as above.   Original Report Authenticated By: Vinnie Langton, M.D.    US Abdomen Limited  06/05/2012  *RADIOLOGY REPORT*  Clinical Data: 52 year old female with cirrhosis and abdominal distention.  LIMITED ABDOMEN ULTRASOUND FOR ASCITES  Technique:   Limited ultrasound survey for ascites was performed in all four abdominal quadrants.  Comparison:  05/14/2012  Findings: A trace amount of ascites is identified within the right lower abdomen. Marked splenomegaly is identified with a splenic volume of 2150 ml.  IMPRESSION: Trace amount of ascites.  Marked splenomegaly again identified.   Original Report Authenticated By: Margarette Canada, M.D.     Scheduled Meds: . B-complex with vitamin C  1 tablet Oral Daily  . furosemide  40 mg Oral q morning - 10a  . multivitamin with minerals  1 tablet Oral Daily  . nadolol  10 mg Oral q morning - 10a  . pantoprazole  40 mg Oral BID AC  . rifaximin  550 mg Oral BID  . sodium chloride  3 mL Intravenous Q12H  . sodium chloride  3 mL Intravenous Q12H  . spironolactone  300 mg Oral q morning - 10a  . traZODone  50 mg Oral QHS   Continuous Infusions:   Principal Problem:   LUQ abdominal pain Active Problems:   Cirrhosis of liver   Anemia   Thrombocytopenia    Time spent: 61    [facility]  If 7PM-7AM, please contact night-coverage at [email or web address], password [facility] 06/06/2012, 11:13 AM  LOS: 1 day  Attending: Patient seen and examined, above notes reviewed. I think she probably has splenic infarct which is not seen on the CT scan. She is nonseptic. She is hemodynamically stable. I agree with gastroenterology consultation. Monitor liver enzymes and INR closely.

## 2012-06-06 NOTE — Progress Notes (Signed)
UR Chart Review Completed  

## 2012-06-07 DIAGNOSIS — R161 Splenomegaly, not elsewhere classified: Secondary | ICD-10-CM

## 2012-06-07 DIAGNOSIS — K7460 Unspecified cirrhosis of liver: Secondary | ICD-10-CM

## 2012-06-07 DIAGNOSIS — R1012 Left upper quadrant pain: Secondary | ICD-10-CM

## 2012-06-07 DIAGNOSIS — K7682 Hepatic encephalopathy: Secondary | ICD-10-CM | POA: Diagnosis present

## 2012-06-07 DIAGNOSIS — K7290 Hepatic failure, unspecified without coma: Secondary | ICD-10-CM

## 2012-06-07 LAB — CBC
Hemoglobin: 10.8 g/dL — ABNORMAL LOW (ref 12.0–15.0)
MCH: 27.9 pg (ref 26.0–34.0)
RBC: 3.87 MIL/uL (ref 3.87–5.11)

## 2012-06-07 LAB — GLUCOSE, CAPILLARY: Glucose-Capillary: 154 mg/dL — ABNORMAL HIGH (ref 70–99)

## 2012-06-07 LAB — COMPREHENSIVE METABOLIC PANEL
ALT: 92 U/L — ABNORMAL HIGH (ref 0–35)
Alkaline Phosphatase: 183 U/L — ABNORMAL HIGH (ref 39–117)
CO2: 26 mEq/L (ref 19–32)
GFR calc Af Amer: 86 mL/min — ABNORMAL LOW (ref >90)
GFR calc non Af Amer: 74 mL/min — ABNORMAL LOW (ref >90)
Glucose, Bld: 156 mg/dL — ABNORMAL HIGH (ref 70–99)
Potassium: 3.6 mEq/L (ref 3.5–5.1)
Sodium: 137 mEq/L (ref 135–145)

## 2012-06-07 LAB — AMMONIA: Ammonia: 120 umol/L — ABNORMAL HIGH (ref 11–60)

## 2012-06-07 MED ORDER — OXYCODONE HCL 5 MG PO TABS
5.0000 mg | ORAL_TABLET | ORAL | Status: DC | PRN
  Administered 2012-06-07: 5 mg via ORAL
  Filled 2012-06-07: qty 1

## 2012-06-07 MED ORDER — LACTULOSE 10 GM/15ML PO SOLN
30.0000 g | Freq: Two times a day (BID) | ORAL | Status: DC
  Administered 2012-06-07 – 2012-06-08 (×2): 30 g via ORAL
  Filled 2012-06-07 (×2): qty 60

## 2012-06-07 MED ORDER — FERUMOXYTOL INJECTION 510 MG/17 ML
510.0000 mg | Freq: Once | INTRAVENOUS | Status: AC
  Administered 2012-06-07: 510 mg via INTRAVENOUS
  Filled 2012-06-07: qty 17

## 2012-06-07 MED ORDER — TRAZODONE HCL 50 MG PO TABS: 25.0000 mg | ORAL_TABLET | Freq: Every evening | ORAL | Status: DC | PRN

## 2012-06-07 MED ORDER — HYDROMORPHONE HCL PF 1 MG/ML IJ SOLN: 0.5000 mg | INTRAMUSCULAR | Status: DC | PRN

## 2012-06-07 NOTE — Progress Notes (Signed)
[facility] PROGRESS NOTE  [name] [id number] DOB: [birth year] DOA: 06/05/2012 PCP: Glo Herring., MD  Assessment/Plan: 1. left upper quadrant abdominal pain: Presumably related to splenic infarct. Slight mprovement this am. She only has small amount of ascites, even though her abdomen is distended. Abdominal CT with massive splenomegaly. This is the only finding on examination that may account for left upper quadrant abdominal pain, however, this appears to be chronic as this is similar to the prior study. White count WNL and afebrile. If she starts developing a fever antibiotics can be considered. 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 GI following and opine likely splenic infarct and if no improvement by tomorrow will repeat CT.  Appreciate the assistance.  Continue pain management  #2 liver cirrhosis: AST, ALT and Alk Phos trending downward slightly this am. CT yields mild to moderate intra and extrahepatic biliary ductal dilatation.Irregularity of the intrahepatic bile ducts is noted. Correlation for signs and symptoms of cholangitis is suggested. She is on the transplant list at the [facility]. Continue with her home medications.  Somewhat lethargic this am. Has not had pain med in several hours. Will monitor closely.   #3. 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 Platelet counts are stable. No anticoagulants to be used.   #4 anemia with a history of iron deficiency: stable. She gets iron infusions. She was supposed to get an infusion 06/05/12. Continue to monitor  .  #5 hypokalemia: resolved this am. Monitor  #6. Hyperglycemia: no hx DM. A1c 5.9 and use SSI for glycemic control.   #7. Encephalopathy: likely related to #2. Will check ammonia level. CBG 172.  Will decrease trazodone and decrease pain med. Monitor closely   Code Status: full Family Communication:  Disposition Plan: home hopefully  tomorrow   Consultants:  GI  Procedures:  none  Antibiotics:  none  HPI/Subjective: Lying in bed eyes closed. Arouses easily but lethargic. States pain improved  Objective: Filed Vitals:   06/06/12 0722 06/06/12 1500 06/06/12 2058 06/07/12 0649  BP:  100/53 91/57 93/58   Pulse:  62 69 64  Temp:  98.1 F (36.7 C) 98.1 F (36.7 C) 98.6 F (37 C)  TempSrc:  Oral Oral Oral  Resp:  18 20 20   Height:      Weight:      SpO2: 99% 100% 99% 93%    Intake/Output Summary (Last 24 hours) at 06/07/12 0950 Last data filed at 06/07/12 0900  Gross per 24 hour  Intake    363 ml  Output      0 ml  Net    363 ml   Filed Weights   06/05/12 1330 06/05/12 2139  Weight: 64.864 kg (143 lb) 68.1 kg (150 lb 2.1 oz)    Exam:   General:  lethargic NAD  Cardiovascular: RRR +murmur no LE edema  Respiratory: normal effort BS clear bilaterally no wheeze  Abdomen: distended but soft tender to palpation in left upper quadrant. Spleen palpable  Musculoskeletal: no clubbing no cyanosis   Data Reviewed: Basic Metabolic Panel:  Recent Labs Lab 06/05/12 1438 06/06/12 0607 06/07/12 0608  NA 136 137 137  K 3.1* 3.8 3.6  CL 100 103 102  CO2 29 29 26   GLUCOSE 213* 166* 156*  BUN 16 12 13   CREATININE 0.75 0.71 0.89  CALCIUM 8.9 8.4 8.1*   Liver Function Tests:  Recent Labs Lab 06/05/12 1438 06/06/12 0607 06/07/12 0608  AST 45* 156* 85*  ALT 36* 104* 92*  ALKPHOS 116 185* 183*  BILITOT 0.9 1.7* 1.0  PROT 7.1 6.3 6.2  ALBUMIN 3.5 3.0* 2.9*    Recent Labs Lab 06/05/12 1438  LIPASE 24   No results found for this basename: AMMONIA,  in the last 168 hours CBC:  Recent Labs Lab 06/05/12 1438 06/06/12 0607 06/07/12 0608  WBC 3.7* 3.9* 5.0  NEUTROABS 2.9  --   --   HGB 10.9* 10.0* 10.8*  HCT 32.8* 30.5* 32.0*  MCV 82.2 83.1 82.7  PLT 47* 39* 42*   Cardiac Enzymes: No results found for this basename: CKTOTAL, CKMB, CKMBINDEX, TROPONINI,  in the last 168  hours BNP (last 3 results) No results found for this basename: PROBNP,  in the last 8760 hours CBG:  Recent Labs Lab 06/06/12 1309 06/06/12 1620 06/06/12 2102 06/07/12 0734  GLUCAP 209* 113* 160* 150*    No results found for this or any previous visit (from the past 240 hour(s)).   Studies: Ct Abdomen Pelvis W Contrast  06/05/2012  *RADIOLOGY REPORT*  Clinical Data: Left upper quadrant abdominal pain.  Abdominal distension.  CT ABDOMEN AND PELVIS WITH CONTRAST  Technique:  Multidetector CT imaging of the abdomen and pelvis was performed following the standard protocol during bolus administration of intravenous contrast.  Contrast: 24mL OMNIPAQUE IOHEXOL 300 MG/ML  SOLN, 179mL OMNIPAQUE IOHEXOL 300 MG/ML  SOLN  Comparison: CT of the abdomen and pelvis 12/06/2011.  Findings:  Lung Bases: Unremarkable.  Abdomen/Pelvis:  The liver has a shrunken appearance and very nodular contour, compatible with cirrhosis.  There is some mild to moderate intrahepatic biliary ductal dilatation in the appearance of the intrahepatic bile ducts is somewhat irregular.  There is also likely a component of periportal edema.  Common bile duct is also dilated measuring up to 11 mm in the porta hepatis.  Status post cholecystectomy.  The pancreas is atrophic.  Marked splenomegaly, similar to the prior examination, currently measuring 18.3 x 10.0 x 16.9 cm. 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  Numerous large vessels are also noted along the lesser curvature of the stomach and around the gastroesophageal junction, compatible with gastroesophageal varices.  The appearance of the adrenal glands is unremarkable. Left kidney is normal in appearance.  Multifocal parenchymal thinning of the right kidney likely represents scarring.  There are multiple calcifications in the right renal collecting system, compatible with  nonobstructive calculi, largest of which measures up to 9 mm in the upper pole collecting system.  Postoperative changes of gastric bypass are noted.  Numerous surgical clips are seen throughout the upper abdomen.  There also appears to be some chronic curvilinear calcification anterior and superior to the spleen, which may reflect sequela of prior hemorrhage.  A small volume of ascites and diffuse mesenteric edema.  Apparent colonic wall thickening in the region of the cecum may simply reflect under distension of bowel, infection/inflammation, or underlying neoplasm.  No pneumoperitoneum.  No pathologic distension of small bowel.  There are a few scattered colonic diverticula.  Accurate assessment for inflammatory changes of diverticulitis is severely limited by the presence of diffuse mesenteric edema and small volume of ascites. Status post hysterectomy.  Ovaries are not confidently identified and may be surgically absent or atrophic.  Urinary bladder is unremarkable in appearance.  Laxity of the pelvic floor, suggesting pelvic organ prolapse.  Musculoskeletal: There are no aggressive appearing lytic or blastic  lesions noted in the visualized portions of the skeleton.  IMPRESSION: 1.  Massive splenomegaly.  This is the only finding on today's examination that may account for left upper quadrant abdominal pain, however, this appears to be chronic as this is similar to the prior study. 2.  There does appear to be some irregular wall thickening in the region of the cecum.  Whether this is simply related to under distension of the bowel in this region, or is a real finding related to inflammation, infection or neoplasm is uncertain. Clinical correlation is recommended.  Follow-up non emergent colonoscopy is suggested to exclude neoplasm if clinically appropriate. 3.  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  There  are also gastroesophageal varices, as above. 4.  Mild to moderate intra and extrahepatic biliary ductal dilatation.  Irregularity of the intrahepatic bile ducts is noted. Correlation for signs and symptoms of cholangitis is suggested. 5.  Nonobstructive calculi in the right renal collecting system, largest of which measures up to 9 mm. 6.  Status post gastric bypass procedure. 7.  Additional incidental findings, as above.   Original Report Authenticated By: Vinnie Langton, M.D.    US Abdomen Limited  06/05/2012  *RADIOLOGY REPORT*  Clinical Data: 52 year old female with cirrhosis and abdominal distention.  LIMITED ABDOMEN ULTRASOUND FOR ASCITES  Technique:  Limited ultrasound survey for ascites was performed in all four abdominal quadrants.  Comparison:  05/14/2012  Findings: A trace amount of ascites is identified within the right lower abdomen. Marked splenomegaly is identified with a splenic volume of 2150 ml.  IMPRESSION: Trace amount of ascites.  Marked splenomegaly again identified.   Original Report Authenticated By: Margarette Canada, M.D.     Scheduled Meds: . B-complex with vitamin C  1 tablet Oral Daily  . furosemide  40 mg Oral q morning - 10a  . insulin aspart  0-9 Units Subcutaneous TID WC  . multivitamin with minerals  1 tablet Oral Daily  . nadolol  10 mg Oral q morning - 10a  . pantoprazole  40 mg Oral BID AC  . rifaximin  550 mg Oral BID  . sodium chloride  3 mL Intravenous Q12H  . sodium chloride  3 mL Intravenous Q12H  . spironolactone  300 mg Oral q morning - 10a  . traZODone  50 mg Oral QHS   Continuous Infusions:   Principal Problem:   LUQ abdominal pain Active Problems:   Cirrhosis of liver   Anemia   Thrombocytopenia   Hyperglycemia    Time spent: 35 minutes    [facility] Page [phone]. If 7PM-7AM, please contact night-coverage at [email or web address], password [facility] 06/07/2012, 9:50 AM  LOS: 2 days   Attending note  Chart reviewed. Patient  interviewed and examined. No asterixis, but ammonia level is 120. Will and lactulose. All ready on Xifaxan. Limit sedating medications. As patient's pain is improving, can hopefully avoid repeat CT of the abdomen.  Doree Barthel, M.D.

## 2012-06-07 NOTE — Progress Notes (Signed)
Subjective:  Feels better. LUQ pain much improved. Wants to go home.  Objective: Vital signs in last 24 hours: Temp:  [98.1 F (36.7 C)-98.6 F (37 C)] 98.3 F (36.8 C) (05/01 1402) Pulse Rate:  [64-69] 67 (05/01 1402) Resp:  [18-20] 18 (05/01 1402) BP: (91-98)/(56-59) 98/56 mmHg (05/01 1402) SpO2:  [93 %-99 %] 94 % (05/01 1402) Last BM Date: 06/07/12 General:   Alert,  Chronically ill-appearing, pleasant and cooperative in NAD Head:  Normocephalic and atraumatic. Eyes:  Sclera clear, no icterus.   Abdomen:  Soft, moderate luq tenderness and nondistended.  Normal bowel sounds, without guarding, and without rebound.   Extremities:  Without clubbing, deformity or edema. Neurologic:  Alert and  oriented x4;  grossly normal neurologically. Skin:  Intact without significant lesions or rashes. Psych:  Alert and cooperative. Normal mood and affect.  Intake/Output from previous day: 04/30 0701 - 05/01 0700 In: 363 [P.O.:360; I.V.:3] Out: -  Intake/Output this shift: Total I/O In: 240 [P.O.:240] Out: -   Lab Results: CBC  Recent Labs  06/05/12 1438 06/06/12 0607 06/07/12 0608  WBC 3.7* 3.9* 5.0  HGB 10.9* 10.0* 10.8*  HCT 32.8* 30.5* 32.0*  MCV 82.2 83.1 82.7  PLT 47* 39* 42*   BMET  Recent Labs  06/05/12 1438 06/06/12 0607 06/07/12 0608  NA 136 137 137  K 3.1* 3.8 3.6  CL 100 103 102  CO2 29 29 26   GLUCOSE 213* 166* 156*  BUN 16 12 13   CREATININE 0.75 0.71 0.89  CALCIUM 8.9 8.4 8.1*   LFTs  Recent Labs  06/05/12 1438 06/06/12 0607 06/07/12 0608  BILITOT 0.9 1.7* 1.0  ALKPHOS 116 185* 183*  AST 45* 156* 85*  ALT 36* 104* 92*  PROT 7.1 6.3 6.2  ALBUMIN 3.5 3.0* 2.9*    Recent Labs  06/05/12 1438  LIPASE 24   PT/INR  Recent Labs  06/05/12 1438 06/07/12 0608  LABPROT 17.0* 17.6*  INR 1.42 1.49      Imaging Studies: Ct Abdomen Pelvis W Contrast  06/05/2012  *RADIOLOGY REPORT*  Clinical Data: Left upper quadrant abdominal pain.   Abdominal distension.  CT ABDOMEN AND PELVIS WITH CONTRAST  Technique:  Multidetector CT imaging of the abdomen and pelvis was performed following the standard protocol during bolus administration of intravenous contrast.  Contrast: 20mL OMNIPAQUE IOHEXOL 300 MG/ML  SOLN, 167mL OMNIPAQUE IOHEXOL 300 MG/ML  SOLN  Comparison: CT of the abdomen and pelvis 12/06/2011.  Findings:  Lung Bases: Unremarkable.  Abdomen/Pelvis:  The liver has a shrunken appearance and very nodular contour, compatible with cirrhosis.  There is some mild to moderate intrahepatic biliary ductal dilatation in the appearance of the intrahepatic bile ducts is somewhat irregular.  There is also likely a component of periportal edema.  Common bile duct is also dilated measuring up to 11 mm in the porta hepatis.  Status post cholecystectomy.  The pancreas is atrophic.  Marked splenomegaly, similar to the prior examination, currently measuring 18.3 x 10.0 x 16.9 cm. 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  Numerous large vessels are also noted along the lesser curvature of the stomach and around the gastroesophageal junction, compatible with gastroesophageal varices.  The appearance of the adrenal glands is unremarkable. Left kidney is normal in appearance.  Multifocal parenchymal thinning of the right kidney likely represents scarring.  There are multiple calcifications in the right renal collecting system, compatible  with nonobstructive calculi, largest of which measures up to 9 mm in the upper pole collecting system.  Postoperative changes of gastric bypass are noted.  Numerous surgical clips are seen throughout the upper abdomen.  There also appears to be some chronic curvilinear calcification anterior and superior to the spleen, which may reflect sequela of prior hemorrhage.  A small volume of ascites and diffuse mesenteric edema.  Apparent  colonic wall thickening in the region of the cecum may simply reflect under distension of bowel, infection/inflammation, or underlying neoplasm.  No pneumoperitoneum.  No pathologic distension of small bowel.  There are a few scattered colonic diverticula.  Accurate assessment for inflammatory changes of diverticulitis is severely limited by the presence of diffuse mesenteric edema and small volume of ascites. Status post hysterectomy.  Ovaries are not confidently identified and may be surgically absent or atrophic.  Urinary bladder is unremarkable in appearance.  Laxity of the pelvic floor, suggesting pelvic organ prolapse.  Musculoskeletal: There are no aggressive appearing lytic or blastic lesions noted in the visualized portions of the skeleton.  IMPRESSION: 1.  Massive splenomegaly.  This is the only finding on today's examination that may account for left upper quadrant abdominal pain, however, this appears to be chronic as this is similar to the prior study. 2.  There does appear to be some irregular wall thickening in the region of the cecum.  Whether this is simply related to under distension of the bowel in this region, or is a real finding related to inflammation, infection or neoplasm is uncertain. Clinical correlation is recommended.  Follow-up non emergent colonoscopy is suggested to exclude neoplasm if clinically appropriate. 3.  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  There are also gastroesophageal varices, as above. 4.  Mild to moderate intra and extrahepatic biliary ductal dilatation.  Irregularity of the intrahepatic bile ducts is noted. Correlation for signs and symptoms of cholangitis is suggested. 5.  Nonobstructive calculi in the right renal collecting system, largest of which measures up to 9 mm. 6.  Status post gastric bypass procedure. 7.  Additional incidental findings, as above.    Original Report Authenticated By: Vinnie Langton, M.D.    US Abdomen Limited  06/05/2012  *RADIOLOGY REPORT*  Clinical Data: 52 year old female with cirrhosis and abdominal distention.  LIMITED ABDOMEN ULTRASOUND FOR ASCITES  Technique:  Limited ultrasound survey for ascites was performed in all four abdominal quadrants.  Comparison:  05/14/2012  Findings: A trace amount of ascites is identified within the right lower abdomen. Marked splenomegaly is identified with a splenic volume of 2150 ml.  IMPRESSION: Trace amount of ascites.  Marked splenomegaly again identified.   Original Report Authenticated By: Margarette Canada, M.D.     Assessment: 52 y/o female with history of cirrhosis/splenomegaly currently on liver transplant list at [facility] admitted with acute onset LUQ pain suspected to be due to splenic infarct. There was massive splenomegaly on CT but no changes suggestive of infarct. ?irregular wall thickening in region of cecum vs under distention (last TCS 10/2011 by Dr. Laural Golden, portal colopathy, 4 polyps removed, mild sigmoid tics). Mild to moderate intra and extrahepatic biliary ductal dilatation with irregularity of the ducts. Mild bump in LFTs from baseline but improved today. No clinical evidence of cholangitis.   Plan: 1. Clinically improved. Patient desires discharge. 2. F/u with Dr. Laural Golden as outpatient if discharged. Otherwise, Dr. Laural Golden to resume care on 06/08/12.   LOS:  2 days   Neil Crouch  06/07/2012, 3:57 PM   Attending note: Agree with above. I have Have discussed with Dr. Conley Canal

## 2012-06-07 NOTE — Progress Notes (Signed)
Orders obtained to leave out IV and DC IV pain meds. PO pain meds ordered.

## 2012-06-07 NOTE — Progress Notes (Signed)
IV red and hard around site. Removed. Paged MD to see if we can leave out. Possible DC in am. Needs PO pain meds to replace IV dilaudid.

## 2012-06-08 LAB — COMPREHENSIVE METABOLIC PANEL
AST: 50 U/L — ABNORMAL HIGH (ref 0–37)
Albumin: 3.1 g/dL — ABNORMAL LOW (ref 3.5–5.2)
Alkaline Phosphatase: 173 U/L — ABNORMAL HIGH (ref 39–117)
Chloride: 102 mEq/L (ref 96–112)
Creatinine, Ser: 0.86 mg/dL (ref 0.50–1.10)
Potassium: 3.3 mEq/L — ABNORMAL LOW (ref 3.5–5.1)
Total Bilirubin: 1 mg/dL (ref 0.3–1.2)

## 2012-06-08 LAB — GLUCOSE, CAPILLARY: Glucose-Capillary: 276 mg/dL — ABNORMAL HIGH (ref 70–99)

## 2012-06-08 LAB — CBC
MCH: 27.6 pg (ref 26.0–34.0)
MCV: 82.3 fL (ref 78.0–100.0)
Platelets: 50 10*3/uL — ABNORMAL LOW (ref 150–400)
RBC: 4.02 MIL/uL (ref 3.87–5.11)

## 2012-06-08 MED ORDER — LACTULOSE 10 GM/15ML PO SOLN: 30.0000 g | Freq: Two times a day (BID) | ORAL | Status: DC

## 2012-06-08 MED ORDER — POTASSIUM CHLORIDE CRYS ER 20 MEQ PO TBCR
20.0000 meq | EXTENDED_RELEASE_TABLET | Freq: Once | ORAL | Status: AC
  Administered 2012-06-08: 20 meq via ORAL
  Filled 2012-06-08: qty 1

## 2012-06-08 MED ORDER — OXYCODONE HCL 5 MG PO TABS: 5.0000 mg | ORAL_TABLET | Freq: Two times a day (BID) | ORAL | Status: DC | PRN

## 2012-06-08 NOTE — Progress Notes (Signed)
Pt discharge instructions and prescriptions given. Questions answered by RN. Pt verbalized understanding of instructions. Discharged home with boyfriend. [place], Konya Fauble N, RN

## 2012-06-08 NOTE — Progress Notes (Addendum)
Patient ID: [name], female   DOB: July 12, 1960, 52 y.o.   MRN: [id number] Feels better. Would like to go home. No pain medication since yesterday. Continues to have some tenderness left upper quadrant, but much better today. Filed Vitals:   06/07/12 1121 06/07/12 1402 06/07/12 2056 06/08/12 0420  BP: 97/59 98/56 104/59 101/63  Pulse: 66 67 83 67  Temp:  98.3 F (36.8 C) 98.2 F (36.8 C) 98.3 F (36.8 C)  TempSrc:  Oral Oral Oral  Resp:  18 16 18   Height:      Weight:      SpO2: 93% 94% 99% 93%   CBC    Component Value Date/Time   WBC 4.7 06/08/2012 0557   RBC 4.02 06/08/2012 0557   HGB 11.1* 06/08/2012 0557   HCT 33.1* 06/08/2012 0557   PLT 50* 06/08/2012 0557   MCV 82.3 06/08/2012 0557   MCH 27.6 06/08/2012 0557   MCHC 33.5 06/08/2012 0557   RDW 21.5* 06/08/2012 0557   LYMPHSABS 0.5* 06/05/2012 1438   MONOABS 0.2 06/05/2012 1438   EOSABS 0.1 06/05/2012 1438   BASOSABS 0.0 06/05/2012 1438   Recommendations: On discharge will need an office visit with Dr,. Rehman in 2 weeks  GI attending note; Patient interviewed and examined. She is feeling much better. Now she has mild tenderness involving splenic border. She is back on lactulose for acute on chronic hepatic encephalopathy. Discussed with Dr. Doree Barthel and agree she is ready for discharge.

## 2012-06-08 NOTE — Care Management Note (Signed)
    Page 1 of 1   06/08/2012     2:00:36 PM   CARE MANAGEMENT NOTE 06/08/2012  Patient:  [name], [name]   Account Number:  [id number]  Date Initiated:  06/08/2012  Documentation initiated by:  Claretha Cooper  Subjective/Objective Assessment:   Pt from home with spouse. No HH needs identified     Action/Plan:   Anticipated DC Date:  06/08/2012   Anticipated DC Plan:  [place]  CM consult      Choice offered to / List presented to:             Status of service:  Completed, signed off Medicare Important Message given?   (If response is "NO", the following Medicare IM given date fields will be blank) Date Medicare IM given:   Date Additional Medicare IM given:    Discharge Disposition:  HOME/SELF CARE  Per UR Regulation:    If discussed at Long Length of Stay Meetings, dates discussed:    Comments:  06/08/12 Claretha Cooper RN BSN CM

## 2012-06-08 NOTE — Discharge Summary (Signed)
Physician Discharge Summary  [name] [id number] DOB: 1960-10-22 DOA: 06/05/2012  PCP: Glo Herring., MD  Admit date: 06/05/2012 Discharge date: 06/08/2012  Time spent: 40 minutes  Recommendations for Outpatient Follow-up:  Follow up with Dr. Laural Golden in 2 weeks Follow up with PCP 1 week. Recommend bmet to track potassium level and glucose level.  Discharge Diagnoses:  Principal Problem:   LUQ abdominal pain Active Problems:   Cirrhosis of liver   Anemia   Thrombocytopenia   Hyperglycemia   Encephalopathy, hepatic   Discharge Condition: stable  Diet recommendation: regular  Filed Weights   06/05/12 1330 06/05/12 2139  Weight: 64.864 kg (143 lb) 68.1 kg (150 lb 2.1 oz)    History of present illness:  [name] is a 52 y.o. female with a past medical history of hepatitis C, liver cirrhosis, who was in her usual state of health till about 4 AM 4/29, when she woke up with  abdominal pain in the left upper side. She described this as a stabbing sensation and was constant. Was 10 out of 10 in intensity. She'd never had pain like this before. Movement increased the pain. There was no relieving factors. There was no radiation of the pain. There was no precipitating factors. She ate salmon for dinner last night. Denied any nausea, vomiting, or diarrhea. Denied any fever or chills. She underwent paracentesis on April 7. Cultures from that fluid analysis were negative. 3 L were removed. Denied any leg swelling. After receiving pain medications the pain decreased down to 7/10 in intensity. She denied having had pain like this in the past.   Hospital Course:  1. left upper quadrant abdominal pain: Presumably related to splenic infarct. Admitted to floor. White blood cell count remained normal, afebrile.She only has small amount of ascites, even though her abdomen is distended. Abdominal CT with massive splenomegaly. This is the only finding on examination that may account for left  upper quadrant abdominal pain, however, this appears to be chronic as this is similar to the prior study. 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 GI followed and opined likely splenic infarct. Pain improved and pt discharged she was tolerating diet.   #2 liver cirrhosis: AST, ALT and Alk Phos elevated on 4/30 but trending downward at discharge. CT yields mild to moderate intra and extrahepatic biliary ductal dilatation.Irregularity of the intrahepatic bile ducts is noted.  She is on the transplant list at the [facility]. Will follow up with Dr Laural Golden in 2 weeks. Pt did become somewhat  Lethargic 06/07/12. Ammonia level 120. Lactulose initiated and will be discharged on same.   #3. 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 Platelet counts remained stable.  .  #4 anemia with a history of iron deficiency: stable. She gets iron infusions. She was supposed to get an infusion 06/05/12. Will follow up outpatient .  #5 hypokalemia:  Repleted and resolved. Recommend OP follow up.  #6. Hyperglycemia: no hx DM. A1c 5.9. Follow up with PCP  #7. Encephalopathy: likely related. Lactulose started    Procedures:  none  Consultations:  GI  Discharge Exam: Filed Vitals:   06/07/12 1121 06/07/12 1402 06/07/12 2056 06/08/12 0420  BP: 97/59 98/56 104/59 101/63  Pulse: 66 67 83 67  Temp:  98.3 F (36.8 C) 98.2 F (36.8 C) 98.3 F (36.8 C)  TempSrc:  Oral Oral Oral  Resp:  18 16 18   Height:      Weight:  SpO2: 93% 94% 99% 93%    General: awake somewhat drowsy  Cardiovascular: RRR +Murmur no gallup no rub No LE edema Respiratory: normal effort BSCTAB no wheeze no rhonchi Abdomen: distended but soft. +BS mild tenderness to palpation LUQ. Spleen palpable  Discharge Instructions      Discharge Orders   Future Orders Complete By Expires     Diet - low sodium heart healthy  As directed     Discharge instructions  As directed      Comments:      If having greater than 5 stools per day decrease lactulose dose.    Increase activity slowly  As directed         Medication List    TAKE these medications       b complex vitamins tablet  Take 1 tablet by mouth daily.     furosemide 40 MG tablet  Commonly known as:  LASIX  Take 40 mg by mouth every morning.     lactulose 10 GM/15ML solution  Commonly known as:  CHRONULAC  Take 45 mLs (30 g total) by mouth 2 (two) times daily.     multivitamin capsule  Take 1 capsule by mouth daily.     nadolol 20 MG tablet  Commonly known as:  CORGARD  Take 10 mg by mouth every morning.     omeprazole 20 MG capsule  Commonly known as:  PRILOSEC  Take 20 mg by mouth Twice daily.     oxyCODONE 5 MG immediate release tablet  Commonly known as:  Oxy IR/ROXICODONE  Take 1 tablet (5 mg total) by mouth every 12 (twelve) hours as needed for pain.     polyethylene glycol packet  Commonly known as:  MIRALAX / GLYCOLAX  Take 17 g by mouth daily as needed. Constipation     spironolactone 100 MG tablet  Commonly known as:  ALDACTONE  Take 300 mg by mouth every morning.     traZODone 50 MG tablet  Commonly known as:  DESYREL  Take 50 mg by mouth at bedtime.     XIFAXAN 550 MG Tabs  Generic drug:  rifaximin  Take 550 mg by mouth 2 (two) times daily.       Allergies  Allergen Reactions  . Ancef (Cefazolin Sodium)     Blisters   Follow-up Information   Follow up with REHMAN,NAJEEB U, MD. Schedule an appointment as soon as possible for a visit in 2 weeks.   Contact information:   [address], [address] [place] [address] [phone]       Follow up with Glo Herring., MD. Schedule an appointment as soon as possible for a visit in 1 week. (recommend bmet to trend potasium level and glucose level. )    Contact information:   [id number] [place] [address] [phone]        The results of significant diagnostics from this  hospitalization (including imaging, microbiology, ancillary and laboratory) are listed below for reference.    Significant Diagnostic Studies: Ct Abdomen Pelvis W Contrast  06/05/2012  *RADIOLOGY REPORT*  Clinical Data: Left upper quadrant abdominal pain.  Abdominal distension.  CT ABDOMEN AND PELVIS WITH CONTRAST  Technique:  Multidetector CT imaging of the abdomen and pelvis was performed following the standard protocol during bolus administration of intravenous contrast.  Contrast: 46mL OMNIPAQUE IOHEXOL 300 MG/ML  SOLN, 158mL OMNIPAQUE IOHEXOL 300 MG/ML  SOLN  Comparison: CT of the abdomen and pelvis 12/06/2011.  Findings:  Lung Bases: Unremarkable.  Abdomen/Pelvis:  The liver has a shrunken appearance and very nodular contour, compatible with cirrhosis.  There is some mild to moderate intrahepatic biliary ductal dilatation in the appearance of the intrahepatic bile ducts is somewhat irregular.  There is also likely a component of periportal edema.  Common bile duct is also dilated measuring up to 11 mm in the porta hepatis.  Status post cholecystectomy.  The pancreas is atrophic.  Marked splenomegaly, similar to the prior examination, currently measuring 18.3 x 10.0 x 16.9 cm. 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  Numerous large vessels are also noted along the lesser curvature of the stomach and around the gastroesophageal junction, compatible with gastroesophageal varices.  The appearance of the adrenal glands is unremarkable. Left kidney is normal in appearance.  Multifocal parenchymal thinning of the right kidney likely represents scarring.  There are multiple calcifications in the right renal collecting system, compatible with nonobstructive calculi, largest of which measures up to 9 mm in the upper pole collecting system.  Postoperative changes of gastric bypass are noted.  Numerous surgical  clips are seen throughout the upper abdomen.  There also appears to be some chronic curvilinear calcification anterior and superior to the spleen, which may reflect sequela of prior hemorrhage.  A small volume of ascites and diffuse mesenteric edema.  Apparent colonic wall thickening in the region of the cecum may simply reflect under distension of bowel, infection/inflammation, or underlying neoplasm.  No pneumoperitoneum.  No pathologic distension of small bowel.  There are a few scattered colonic diverticula.  Accurate assessment for inflammatory changes of diverticulitis is severely limited by the presence of diffuse mesenteric edema and small volume of ascites. Status post hysterectomy.  Ovaries are not confidently identified and may be surgically absent or atrophic.  Urinary bladder is unremarkable in appearance.  Laxity of the pelvic floor, suggesting pelvic organ prolapse.  Musculoskeletal: There are no aggressive appearing lytic or blastic lesions noted in the visualized portions of the skeleton.  IMPRESSION: 1.  Massive splenomegaly.  This is the only finding on today's examination that may account for left upper quadrant abdominal pain, however, this appears to be chronic as this is similar to the prior study. 2.  There does appear to be some irregular wall thickening in the region of the cecum.  Whether this is simply related to under distension of the bowel in this region, or is a real finding related to inflammation, infection or neoplasm is uncertain. Clinical correlation is recommended.  Follow-up non emergent colonoscopy is suggested to exclude neoplasm if clinically appropriate. 3.  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  There are also gastroesophageal varices, as above. 4.  Mild to moderate intra and extrahepatic biliary ductal dilatation.  Irregularity of the intrahepatic bile ducts is  noted. Correlation for signs and symptoms of cholangitis is suggested. 5.  Nonobstructive calculi in the right renal collecting system, largest of which measures up to 9 mm. 6.  Status post gastric bypass procedure. 7.  Additional incidental findings, as above.   Original Report Authenticated By: Vinnie Langton, M.D.    US Abdomen Limited  06/05/2012  *RADIOLOGY REPORT*  Clinical Data: 52 year old female with cirrhosis and abdominal distention.  LIMITED ABDOMEN ULTRASOUND FOR ASCITES  Technique:  Limited ultrasound survey for ascites was performed in all four  abdominal quadrants.  Comparison:  05/14/2012  Findings: A trace amount of ascites is identified within the right lower abdomen. Marked splenomegaly is identified with a splenic volume of 2150 ml.  IMPRESSION: Trace amount of ascites.  Marked splenomegaly again identified.   Original Report Authenticated By: Margarette Canada, M.D.    US Abdomen Limited  05/14/2012  *RADIOLOGY REPORT*  Clinical Data: Evaluate for ascites.  Hepatitis C.  LIMITED ABDOMINAL ULTRASOUND  Comparison:  None.  Findings: Ultrasound performed to evaluate for quantity of ascites. There is moderate amount of ascites, primarily seen within the right lower quadrant.  Ascites is identified in all imaged quadrants however.  IMPRESSION: Moderate ascites.   Original Report Authenticated By: Nolon Nations, M.D.    US Paracentesis  05/14/2012  *RADIOLOGY REPORT*  Clinical Data: Cirrhosis.  Recurrent ascites.  ULTRASOUND GUIDED PARACENTESIS  Comparison:  CT 12/06/2011  An ultrasound guided paracentesis was thoroughly discussed with the patient and questions answered.  The benefits, risks, alternatives and complications were also discussed.  The patient understands and wishes to proceed with the procedure.  Written consent was obtained.  Ultrasound was performed to localize and mark an adequate pocket of fluid in the right lower quadrant of the abdomen.  The area was then prepped and draped in  the normal sterile fashion.  1% Lidocaine was used for local anesthesia.  Under ultrasound guidance a 19 gauge Yueh catheter was introduced.  Paracentesis was performed.  The catheter was removed and a dressing applied.  Complications: None  Findings:  A total of approximately 2600 cc of clear/yellow fluid was removed.  A fluid sample was sent for laboratory analysis.  IMPRESSION: Successful ultrasound guided paracentesis yielding 2600 cc of ascites.   Original Report Authenticated By: Abigail Miyamoto, M.D.     Microbiology: No results found for this or any previous visit (from the past 240 hour(s)).   Labs: Basic Metabolic Panel:  Recent Labs Lab 06/05/12 1438 06/06/12 0607 06/07/12 0608 06/08/12 0557  NA 136 137 137 137  K 3.1* 3.8 3.6 3.3*  CL 100 103 102 102  CO2 29 29 26 27   GLUCOSE 213* 166* 156* 142*  BUN 16 12 13 13   CREATININE 0.75 0.71 0.89 0.86  CALCIUM 8.9 8.4 8.1* 8.5   Liver Function Tests:  Recent Labs Lab 06/05/12 1438 06/06/12 0607 06/07/12 0608 06/08/12 0557  AST 45* 156* 85* 50*  ALT 36* 104* 92* 72*  ALKPHOS 116 185* 183* 173*  BILITOT 0.9 1.7* 1.0 1.0  PROT 7.1 6.3 6.2 6.4  ALBUMIN 3.5 3.0* 2.9* 3.1*    Recent Labs Lab 06/05/12 1438  LIPASE 24    Recent Labs Lab 06/07/12 1502  AMMONIA 120*   CBC:  Recent Labs Lab 06/05/12 1438 06/06/12 0607 06/07/12 0608 06/08/12 0557  WBC 3.7* 3.9* 5.0 4.7  NEUTROABS 2.9  --   --   --   HGB 10.9* 10.0* 10.8* 11.1*  HCT 32.8* 30.5* 32.0* 33.1*  MCV 82.2 83.1 82.7 82.3  PLT 47* 39* 42* 50*   Cardiac Enzymes: No results found for this basename: CKTOTAL, CKMB, CKMBINDEX, TROPONINI,  in the last 168 hours BNP: BNP (last 3 results) No results found for this basename: PROBNP,  in the last 8760 hours CBG:  Recent Labs Lab 06/07/12 0734 06/07/12 1146 06/07/12 1657 06/07/12 2124 06/08/12 0735  GLUCAP 150* 172* 148* 154* 276*       Signed:  BLACK,KAREN M  [facility] 06/08/2012,  11:11 AM  Seen and  agree.  Doree Barthel, M.D.

## 2012-06-26 ENCOUNTER — Encounter (INDEPENDENT_AMBULATORY_CARE_PROVIDER_SITE_OTHER): Payer: Self-pay | Admitting: Internal Medicine

## 2012-06-26 ENCOUNTER — Ambulatory Visit (INDEPENDENT_AMBULATORY_CARE_PROVIDER_SITE_OTHER): Payer: BC Managed Care – PPO | Admitting: Internal Medicine

## 2012-06-26 ENCOUNTER — Other Ambulatory Visit (HOSPITAL_COMMUNITY): Payer: Self-pay | Admitting: Internal Medicine

## 2012-06-26 VITALS — BP 118/68 | HR 76 | Temp 97.6°F | Resp 18 | Ht 66.0 in | Wt 149.6 lb

## 2012-06-26 DIAGNOSIS — R5383 Other fatigue: Secondary | ICD-10-CM

## 2012-06-26 DIAGNOSIS — R1012 Left upper quadrant pain: Secondary | ICD-10-CM

## 2012-06-26 DIAGNOSIS — R5381 Other malaise: Secondary | ICD-10-CM

## 2012-06-26 DIAGNOSIS — K7460 Unspecified cirrhosis of liver: Secondary | ICD-10-CM

## 2012-06-26 DIAGNOSIS — Z09 Encounter for follow-up examination after completed treatment for conditions other than malignant neoplasm: Secondary | ICD-10-CM

## 2012-06-26 DIAGNOSIS — Z0000 Encounter for general adult medical examination without abnormal findings: Secondary | ICD-10-CM

## 2012-06-26 NOTE — Patient Instructions (Signed)
Physician will contact you with results of CT when completed

## 2012-06-26 NOTE — Progress Notes (Signed)
Presenting complaint;  Persistent left upper quadrant abdominal pain.  Subjective:  Patient is 52 year old Caucasian female who has advanced cirrhosis secondary to chronic hepatitis C which was successfully treated 10 years ago. She is on waiting list for OLT at [facility]. She was admitted over 3 weeks ago with sudden onset of severe left upper quadrant abdominal pain. Her pain was felt to be secondary to splenic infarct. She has massive splenomegaly. CT was negative. She gradually improved with bedrest and pain medication. She states her pain has persisted. She is having to take pain pills 3-4 times a day. She feels miserable. She states she is not able to do anything at home. She denies fever chills nausea vomiting melena rectal bleeding hematuria or dysuria. She has not been able to work since her recent hospitalization. She is self-employed. She is scheduled to have EGD and possible variceal banding at [facility] on 07/24/2012 and is also scheduled to see transplant surgeon that afternoon. She denies confusion episodes. Her bowels move at least 2-3 times per day.  Current Medications: Current Outpatient Prescriptions  Medication Sig Dispense Refill  . b complex vitamins tablet Take 1 tablet by mouth daily.      . furosemide (LASIX) 40 MG tablet Take 40 mg by mouth every morning.       . lactulose (CHRONULAC) 10 GM/15ML solution Take 45 mLs (30 g total) by mouth 2 (two) times daily.  240 mL  0  . Multiple Vitamin (MULTIVITAMIN) capsule Take 1 capsule by mouth daily.      . nadolol (CORGARD) 20 MG tablet Take 10 mg by mouth every morning.       Marland Kitchen omeprazole (PRILOSEC) 20 MG capsule Take 20 mg by mouth Twice daily.      Marland Kitchen oxyCODONE (OXY IR/ROXICODONE) 5 MG immediate release tablet Take 1 tablet (5 mg total) by mouth every 12 (twelve) hours as needed for pain.  15 tablet  0  . rifaximin (XIFAXAN) 550 MG TABS Take 550 mg by mouth 2 (two) times daily.      Marland Kitchen spironolactone (ALDACTONE) 100 MG tablet Take 300  mg by mouth every morning.       . traZODone (DESYREL) 50 MG tablet Take 50 mg by mouth at bedtime.       No current facility-administered medications for this visit.     Objective: Blood pressure 118/68, pulse 76, temperature 97.6 F (36.4 C), temperature source Oral, resp. rate 18, height 5\' 6"  (1.676 m), weight 149 lb 9.6 oz (67.858 kg). Patient is alert and in no acute distress. She appears chronically ill. She does not have asterixis. Conjunctiva is pink. Sclera is nonicteric Oropharyngeal mucosa is normal. No neck masses or thyromegaly noted. Cardiac exam with regular rhythm normal S1 and S2. No murmur or gallop noted. Lungs are clear to auscultation. Abdomen is full. Bowel sounds are normal. Abdomen is soft with easily palpable spleen which is tender. No rub noted over it. Liver edge is also easily palpable is firm and nontender. Shifting dullness is absent.  No LE edema or clubbing noted.   Assessment:  #1. Persistent left upper quadrant abdominal pain thought to be due to splenic infarct. CT 3 weeks ago was negative and she has not improved with supportive therapy. She therefore needs to be re-scanned. In the meantime we will continue with her pain medication. She did get pain medication prescription from Dr. Gerarda Fraction. #2. Hepatic encephalopathy. Clinically she does not appear to be encephalopathic. #3. Advanced cirrhosis. She has  undergone multiple banding session for large esophageal varices for primary prophylaxis. She has not undergone or LT because her MELD score has always been low.    Plan:  Abdominopelvic CT with contrast as soon as possible. Continue Xifaxan and lactulose. Continue oxycodone as before for pain control. Will plan to see her back in the office in 3 months or sooner if necessary.

## 2012-06-29 ENCOUNTER — Ambulatory Visit (HOSPITAL_COMMUNITY)
Admission: RE | Admit: 2012-06-29 | Discharge: 2012-06-29 | Disposition: A | Discharge status: Home / Self Care | Payer: BC Managed Care – PPO | Source: Ambulatory Visit | Attending: Internal Medicine | Admitting: Internal Medicine

## 2012-06-29 DIAGNOSIS — Z9884 Bariatric surgery status: Secondary | ICD-10-CM | POA: Insufficient documentation

## 2012-06-29 DIAGNOSIS — N200 Calculus of kidney: Secondary | ICD-10-CM | POA: Insufficient documentation

## 2012-06-29 DIAGNOSIS — K7460 Unspecified cirrhosis of liver: Secondary | ICD-10-CM | POA: Insufficient documentation

## 2012-06-29 DIAGNOSIS — R161 Splenomegaly, not elsewhere classified: Secondary | ICD-10-CM | POA: Insufficient documentation

## 2012-06-29 DIAGNOSIS — R1012 Left upper quadrant pain: Secondary | ICD-10-CM

## 2012-06-29 DIAGNOSIS — I81 Portal vein thrombosis: Secondary | ICD-10-CM | POA: Insufficient documentation

## 2012-06-29 DIAGNOSIS — R188 Other ascites: Secondary | ICD-10-CM | POA: Insufficient documentation

## 2012-06-29 MED ORDER — IOHEXOL 300 MG/ML  SOLN
100.0000 mL | Freq: Once | INTRAMUSCULAR | Status: AC | PRN
  Administered 2012-06-29: 100 mL via INTRAVENOUS

## 2012-07-11 ENCOUNTER — Ambulatory Visit (HOSPITAL_COMMUNITY)
Admission: RE | Admit: 2012-07-11 | Discharge: 2012-07-11 | Disposition: A | Discharge status: Home / Self Care | Payer: BC Managed Care – PPO | Source: Ambulatory Visit | Attending: Internal Medicine | Admitting: Internal Medicine

## 2012-07-11 DIAGNOSIS — Z09 Encounter for follow-up examination after completed treatment for conditions other than malignant neoplasm: Secondary | ICD-10-CM | POA: Insufficient documentation

## 2012-07-11 DIAGNOSIS — N6489 Other specified disorders of breast: Secondary | ICD-10-CM | POA: Insufficient documentation

## 2012-07-23 ENCOUNTER — Ambulatory Visit (HOSPITAL_COMMUNITY)
Admission: RE | Admit: 2012-07-23 | Discharge: 2012-07-23 | Disposition: A | Discharge status: Home / Self Care | Payer: BC Managed Care – PPO | Source: Ambulatory Visit | Attending: Internal Medicine | Admitting: Internal Medicine

## 2012-07-23 ENCOUNTER — Other Ambulatory Visit (INDEPENDENT_AMBULATORY_CARE_PROVIDER_SITE_OTHER): Payer: Self-pay | Admitting: Internal Medicine

## 2012-07-23 ENCOUNTER — Telehealth (INDEPENDENT_AMBULATORY_CARE_PROVIDER_SITE_OTHER): Payer: Self-pay | Admitting: *Deleted

## 2012-07-23 DIAGNOSIS — K7460 Unspecified cirrhosis of liver: Secondary | ICD-10-CM

## 2012-07-23 DIAGNOSIS — R188 Other ascites: Secondary | ICD-10-CM

## 2012-07-23 LAB — BODY FLUID CELL COUNT WITH DIFFERENTIAL
Eos, Fluid: 0 %
Lymphs, Fluid: 1 %
Monocyte-Macrophage-Serous Fluid: 15 % — ABNORMAL LOW (ref 50–90)
Neutrophil Count, Fluid: 84 % — ABNORMAL HIGH (ref 0–25)
Total Nucleated Cell Count, Fluid: 11727 cu mm — ABNORMAL HIGH (ref 0–1000)

## 2012-07-23 MED ORDER — ALBUMIN HUMAN 25 % IV SOLN: 50.0000 g | Freq: Once | INTRAVENOUS | Status: DC

## 2012-07-23 NOTE — Telephone Encounter (Signed)
Patient states she is swollen and abdomen is hurting and wants to tap sch'd today please advise

## 2012-07-23 NOTE — Telephone Encounter (Signed)
US paracentesis sch'd today (07/23/12) @ 100 (1245), patient aware

## 2012-07-23 NOTE — Telephone Encounter (Signed)
Ok to proceed with LVAP, will need cell count w/ diff, gram stain aerobic/anaerobic cultures

## 2012-07-23 NOTE — Procedures (Signed)
PreOperative Dx: Cirrhosis, ascites Postoperative Dx: Cirrhosis, ascites Procedure:   US guided paracentesis Radiologist:  Thornton Papas Anesthesia:  8 ml of 1% lidocaine Specimen:  1700 ml of peach colored fluid EBL:   < 1 ml Complications: None

## 2012-07-23 NOTE — Progress Notes (Signed)
Lidocaine 1%           28mL injected                        1746mL abdominal fluid removed

## 2012-07-24 LAB — PATHOLOGIST SMEAR REVIEW

## 2012-07-28 LAB — CULTURE, BODY FLUID W GRAM STAIN -BOTTLE: Culture: NO GROWTH

## 2012-09-25 ENCOUNTER — Encounter (INDEPENDENT_AMBULATORY_CARE_PROVIDER_SITE_OTHER): Payer: Self-pay | Admitting: Internal Medicine

## 2012-09-25 ENCOUNTER — Ambulatory Visit (INDEPENDENT_AMBULATORY_CARE_PROVIDER_SITE_OTHER): Payer: BC Managed Care – PPO | Admitting: Internal Medicine

## 2012-09-25 VITALS — BP 108/66 | HR 74 | Temp 97.5°F | Resp 18 | Ht 66.0 in | Wt 149.7 lb

## 2012-09-25 DIAGNOSIS — R188 Other ascites: Secondary | ICD-10-CM

## 2012-09-25 DIAGNOSIS — R319 Hematuria, unspecified: Secondary | ICD-10-CM

## 2012-09-25 DIAGNOSIS — K7460 Unspecified cirrhosis of liver: Secondary | ICD-10-CM

## 2012-09-25 DIAGNOSIS — G4700 Insomnia, unspecified: Secondary | ICD-10-CM | POA: Insufficient documentation

## 2012-09-25 DIAGNOSIS — K625 Hemorrhage of anus and rectum: Secondary | ICD-10-CM

## 2012-09-25 MED ORDER — CIPROFLOXACIN HCL 500 MG PO TABS: 500.0000 mg | ORAL_TABLET | Freq: Every day | ORAL | Status: DC

## 2012-09-25 MED ORDER — TRAZODONE HCL 50 MG PO TABS: 50.0000 mg | ORAL_TABLET | Freq: Every day | ORAL | Status: DC

## 2012-09-25 NOTE — Patient Instructions (Signed)
Physician will contact her with results of blood work and urinalysis.

## 2012-09-25 NOTE — Progress Notes (Signed)
Presenting complaint;  Followup for cirrhosis and ascites. Patient has multiple complaints.  Data Base/Subjective:  Patient is 52 year old Caucasian female who has cirrhosis secondary to successfully treated hepatitis C prior disease has been complicated by hepatic encephalopathy and ascites. She also has undergone his aphasia variceal banding for primary prophylaxis. She was hospitalized towards the end of April 2014 with excruciating left upper quadrant pain. Imaging studies were negative but she was felt to have pain due to splenic infarcts. Acute pain has subsided but she remains with intermittent pain. She was seen in the office on 06/26/2012. She had another followup CT on 07/03/2012 revealing splenomegaly among other abnormalities and no changes to suggest splenic infarct. She had abdominal paracenteses with removal of 1.7 L of fluid on 07/24/2022. Cell count was over 11,000 blood cultures remain negative. This result was not sent to me.I just saw this today on review of her chart.  She was seen at [facility] on 07/24/2012 and underwent EGD but did not require banding as on previous EGDs. She feels miserable. She has no energy and unable to function. She has not been able to work for 8 months. She applied for disability but was rejected. She continues to complain of intermittent pain in left upper quadrant. She denies fever chills nausea or vomiting. Last week she had bright red blood per rectum as well as hematuria which lasted for 4 days. She did not experience flank pain. Both of these symptoms have resolved. She feels very disgusted as she has not been placed unveiling list for liver transplant. She states her meld score in June 2014 was 10. She also complains of leg cramps. She needs a refill on trazodone. She is not able to sleep unless she takes his medication.  Current Medications: Current Outpatient Prescriptions  Medication Sig Dispense Refill  . b complex vitamins tablet Take 1 tablet  by mouth daily.      . furosemide (LASIX) 40 MG tablet Take 60 mg by mouth every morning.       . lactulose (CHRONULAC) 10 GM/15ML solution Take 45 mLs (30 g total) by mouth 2 (two) times daily.  240 mL  0  . Multiple Vitamin (MULTIVITAMIN) capsule Take 1 capsule by mouth daily.      . nadolol (CORGARD) 20 MG tablet Take 10 mg by mouth every morning.       Marland Kitchen omeprazole (PRILOSEC) 20 MG capsule Take 20 mg by mouth Twice daily.      . rifaximin (XIFAXAN) 550 MG TABS Take 550 mg by mouth 2 (two) times daily.      Marland Kitchen spironolactone (ALDACTONE) 100 MG tablet Take 300 mg by mouth every morning.       . traZODone (DESYREL) 50 MG tablet Take 1 tablet (50 mg total) by mouth at bedtime.  30 tablet  5  . ciprofloxacin (CIPRO) 500 MG tablet Take 1 tablet (500 mg total) by mouth daily.  30 tablet  2   No current facility-administered medications for this visit.     Objective: Blood pressure 108/66, pulse 74, temperature 97.5 F (36.4 C), temperature source Oral, resp. rate 18, height 5\' 6"  (1.676 m), weight 149 lb 11.2 oz (67.903 kg). Patient is alert and does not have asterixis. Conjunctiva is pink. Sclera is nonicteric Oropharyngeal mucosa is normal. No neck masses or thyromegaly noted. Cardiac exam with regular rhythm normal S1 and S2. No murmur or gallop noted. Lungs are clear to auscultation. Abdomen is protuberant not tense. She has very large  tender spleen but no rub noted. Liver is also easily palpable. Mild tenderness also noted in right mid abdomen. Flanks are dull but no shifting noted.  No LE edema or clubbing noted.  Labs/studies Results: Ascitic fluid results from 07/23/2012 WBC 11 727; segs 84% Gram stain negative for microorganisms. Aerobic and anaerobic cultures negative.    Assessment:  #1. Advanced cirrhosis secondary to successfully treated hepatitis C. Repeated HCVRNA's have been negative. Her disease has been complicated by hepatic encephalopathy, ascites and esophageal  varices. She has never bled from varices but she has undergone banding at [facility] for primary prophylaxis. She did not require banding on last EGD two months ago. Her main symptom now is one of extreme fatigue. Suspect this symptom is secondary to advanced liver disease. Will make sure she does not have low hemoglobin or hypokalemia. She had an intracystic ascites 2 months ago. Cultures were negative. This may have been due to clinical diagnoses of splenic infarcts. I would go head and start her on Cipro for SBP prophylaxis. #2. Rectal bleeding. It has stopped. She had colonoscopy in September 2013 with removal of 2 small adenomas and she also sigmoid diverticula and hemorrhoids. I suspect she bled from hemorrhoids. #3. Recent hematuria. This symptom has resolved. Suspect this is secondary to urolithiasis. She has multiple small stones in the right kidney. Will check UA to make sure she does not have infection. #4. Insomnia. She is on trazodone which was begun by her hepatologist at [facility].   Plan:  New prescription for trazodone given. 50 mg daily 30 with 5 refills. Cipro 500 mg by mouth daily for SBP prophylaxis. Patient will go to the lab for CBC, bilirubin, INR, alpha-fetoprotein, serum magnesium, metabolic 7 and urinalysis.

## 2012-09-26 ENCOUNTER — Other Ambulatory Visit (INDEPENDENT_AMBULATORY_CARE_PROVIDER_SITE_OTHER): Payer: Self-pay | Admitting: Internal Medicine

## 2012-09-26 LAB — CBC
MCV: 83.9 fL (ref 78.0–100.0)
Platelets: 53 10*3/uL — ABNORMAL LOW (ref 150–400)
RDW: 14.8 % (ref 11.5–15.5)
WBC: 4.6 10*3/uL (ref 4.0–10.5)

## 2012-09-26 LAB — PROTIME-INR: INR: 1.36 (ref <1.50)

## 2012-09-27 LAB — URINALYSIS, ROUTINE W REFLEX MICROSCOPIC
Bilirubin Urine: NEGATIVE
Glucose, UA: NEGATIVE mg/dL
Hgb urine dipstick: NEGATIVE
Protein, ur: NEGATIVE mg/dL

## 2012-09-27 LAB — BASIC METABOLIC PANEL
BUN: 15 mg/dL (ref 6–23)
Chloride: 97 mEq/L (ref 96–112)
Creat: 0.97 mg/dL (ref 0.50–1.10)

## 2012-09-27 LAB — URINALYSIS, MICROSCOPIC ONLY

## 2012-09-27 LAB — BILIRUBIN, TOTAL: Total Bilirubin: 1.2 mg/dL (ref 0.3–1.2)

## 2012-09-30 ENCOUNTER — Emergency Department (HOSPITAL_COMMUNITY)
Admission: EM | Admit: 2012-09-30 | Discharge: 2012-09-30 | Disposition: A | Discharge status: Home / Self Care | Payer: BC Managed Care – PPO | Attending: Emergency Medicine | Admitting: Emergency Medicine

## 2012-09-30 ENCOUNTER — Encounter (HOSPITAL_COMMUNITY): Payer: Self-pay

## 2012-09-30 DIAGNOSIS — L299 Pruritus, unspecified: Secondary | ICD-10-CM | POA: Insufficient documentation

## 2012-09-30 DIAGNOSIS — Z87891 Personal history of nicotine dependence: Secondary | ICD-10-CM | POA: Insufficient documentation

## 2012-09-30 DIAGNOSIS — M2550 Pain in unspecified joint: Secondary | ICD-10-CM | POA: Insufficient documentation

## 2012-09-30 DIAGNOSIS — B029 Zoster without complications: Secondary | ICD-10-CM | POA: Insufficient documentation

## 2012-09-30 DIAGNOSIS — Z87442 Personal history of urinary calculi: Secondary | ICD-10-CM | POA: Insufficient documentation

## 2012-09-30 DIAGNOSIS — D649 Anemia, unspecified: Secondary | ICD-10-CM | POA: Insufficient documentation

## 2012-09-30 DIAGNOSIS — R5381 Other malaise: Secondary | ICD-10-CM | POA: Insufficient documentation

## 2012-09-30 DIAGNOSIS — Z8719 Personal history of other diseases of the digestive system: Secondary | ICD-10-CM | POA: Insufficient documentation

## 2012-09-30 DIAGNOSIS — Z79899 Other long term (current) drug therapy: Secondary | ICD-10-CM | POA: Insufficient documentation

## 2012-09-30 LAB — URINE CULTURE: Colony Count: >=100000

## 2012-09-30 MED ORDER — OXYCODONE-ACETAMINOPHEN 5-325 MG PO TABS
1.0000 | ORAL_TABLET | Freq: Once | ORAL | Status: AC
  Administered 2012-09-30: 1 via ORAL
  Filled 2012-09-30: qty 1

## 2012-09-30 MED ORDER — FAMCICLOVIR 500 MG PO TABS: 500.0000 mg | ORAL_TABLET | Freq: Three times a day (TID) | ORAL | Status: DC

## 2012-09-30 MED ORDER — OXYCODONE-ACETAMINOPHEN 5-325 MG PO TABS: 1.0000 | ORAL_TABLET | Freq: Four times a day (QID) | ORAL | Status: DC | PRN

## 2012-09-30 NOTE — ED Notes (Signed)
Pt is on list for liver transplant.

## 2012-09-30 NOTE — ED Notes (Signed)
Pt reports rash to left butt cheek, that started yesterday. Thinks she may have shingles. Also feeling very tired.

## 2012-09-30 NOTE — ED Provider Notes (Signed)
CSN: [id number]     Arrival date & time 09/30/12  1322 History    This chart was scribed for Natasha Diego, MD,  by Stacy Gardner, ED Scribe. The patient was seen in room APA07/APA07 and the patient's care was started at 1:51 PM   Chief Complaint  Patient presents with  . Rash   (Consider location/radiation/quality/duration/timing/severity/associated sxs/prior Treatment) Patient is a 52 y.o. female presenting with rash. The history is provided by the patient and medical records. No language interpreter was used.  Rash Location:  Ano-genital Ano-genital rash location:  L buttock Quality: blistering, itchiness and painful   Pain details:    Quality:  Aching and shooting   Severity:  Severe   Timing:  Constant   Progression:  Worsening Severity:  Severe Onset quality:  Sudden Duration:  2 days Timing:  Constant Progression:  Spreading Chronicity:  New Relieved by:  Nothing Worsened by:  Nothing tried Ineffective treatments:  None tried Associated symptoms: fatigue and joint pain    HPI Comments: [name] is a 52 y.o. female who is on the liver transplant list presents to the Emergency Department complaining of moderate vesicular rash on her left butt cheek with onset of yesterday. She describes the rash as itchy, blistering, and sharp in constant episodes. Pt also reports feeling fatigued with the onset of the rash. She denies taking any medications prior to arrival.  Past Medical History  Diagnosis Date  . Esophageal varices   . Cirrhosis     non alcoholic  . Ascites   . Anemia   . Kidney stones    Past Surgical History  Procedure Laterality Date  . Abdominal hysterectomy    . Cholecystectomy    . Upper gastrointestinal endoscopy    . Colonoscopy    . Hernia    . Gastric bypass    . Cesarean section    . Hernia repair  04/14/11    Patient states that she hs had 4 total, with most recent  being 04/14/11.  . Lithotripsy    . Knee arthroscopy      Right knee   . Colonoscopy  10/21/2011    Procedure: COLONOSCOPY;  Surgeon: Rogene Houston, MD;  Location: [facility];  Service: Endoscopy;  Laterality: N/A;  245   . Cystoscopy with stent placement  12/08/2011    Procedure: CYSTOSCOPY WITH STENT PLACEMENT;  Surgeon: Marissa Nestle, MD;  Location: [facility];  Service: Urology;  Laterality: Right;  . Cystoscopy w/ retrogrades  12/08/2011    Procedure: CYSTOSCOPY WITH RETROGRADE PYELOGRAM;  Surgeon: Marissa Nestle, MD;  Location: [facility];  Service: Urology;  Laterality: Right;   Family History  Problem Relation Age of Onset  . Dementia Mother   . Heart disease Father   . Liver disease Father   . Hypertension Father   . Diabetes Father   . Dementia Father   . Hypertension Brother    History  Substance Use Topics  . Smoking status: Former Smoker    Types: Cigarettes    Quit date: 05/02/1991  . Smokeless tobacco: Never Used  . Alcohol Use: No   OB History   Grav Para Term Preterm Abortions TAB SAB Ect Mult Living                 Review of Systems  Constitutional: Positive for fatigue.  Musculoskeletal: Positive for arthralgias.  Skin: Positive for rash.  All other systems reviewed and are negative.  Allergies  Ancef  Home Medications   Current Outpatient Rx  Name  Route  Sig  Dispense  Refill  . b complex vitamins tablet   Oral   Take 1 tablet by mouth daily.         . ciprofloxacin (CIPRO) 500 MG tablet   Oral   Take 1 tablet (500 mg total) by mouth daily.   30 tablet   2   . furosemide (LASIX) 40 MG tablet   Oral   Take 60 mg by mouth every morning.          . lactulose (CHRONULAC) 10 GM/15ML solution   Oral   Take 45 mLs (30 g total) by mouth 2 (two) times daily.   240 mL   0   . Multiple Vitamin (MULTIVITAMIN) capsule   Oral   Take 1 capsule by mouth daily.         . nadolol (CORGARD) 20 MG tablet   Oral   Take 10 mg by mouth every morning.          Marland Kitchen omeprazole (PRILOSEC) 20 MG capsule    Oral   Take 20 mg by mouth Twice daily.         . rifaximin (XIFAXAN) 550 MG TABS   Oral   Take 550 mg by mouth 2 (two) times daily.         Marland Kitchen spironolactone (ALDACTONE) 100 MG tablet   Oral   Take 300 mg by mouth every morning.          . traZODone (DESYREL) 50 MG tablet   Oral   Take 1 tablet (50 mg total) by mouth at bedtime.   30 tablet   5    BP 120/50  Pulse 84  Temp(Src) 97.5 F (36.4 C) (Oral)  Resp 20  Ht 5\' 6"  (1.676 m)  Wt 142 lb (64.411 kg)  BMI 22.93 kg/m2  SpO2 98% Physical Exam  Nursing note and vitals reviewed. Constitutional: She is oriented to person, place, and time. She appears well-developed and well-nourished. No distress.  HENT:  Head: Normocephalic.  Eyes: Conjunctivae are normal.  Neck: No tracheal deviation present.  Cardiovascular:  No murmur heard. Musculoskeletal: Normal range of motion.  Neurological: She is alert and oriented to person, place, and time. She has normal reflexes.  Skin: Skin is warm. Rash noted. She is not diaphoretic. There is erythema.  Rash on left butt cheek with vesicles that appears to be shingles.    Psychiatric: She has a normal mood and affect. Her behavior is normal.    ED Course  DIAGNOSTIC STUDIES: Oxygen Saturation is 98% on room, normal by my interpretation.    COORDINATION OF CARE: 1:54 PM Discussed course of care with pt . Pt understands and agrees.  Procedures (including critical care time)  Labs Reviewed - No data to display No results found. No diagnosis found.  MDM    The chart was scribed for me under my direct supervision.  I personally performed the history, physical, and medical decision making and all procedures in the evaluation of this patient.Natasha Diego, MD 09/30/12 [phone]

## 2012-09-30 NOTE — ED Notes (Signed)
MD at bedside. 

## 2012-10-01 ENCOUNTER — Telehealth (INDEPENDENT_AMBULATORY_CARE_PROVIDER_SITE_OTHER): Payer: Self-pay | Admitting: *Deleted

## 2012-10-01 DIAGNOSIS — N390 Urinary tract infection, site not specified: Secondary | ICD-10-CM

## 2012-10-01 NOTE — Telephone Encounter (Signed)
Per Dr.Rehman the patient will need to have labs drawn, this is for a urine and culture

## 2012-10-03 ENCOUNTER — Other Ambulatory Visit (INDEPENDENT_AMBULATORY_CARE_PROVIDER_SITE_OTHER): Payer: Self-pay | Admitting: *Deleted

## 2012-10-03 ENCOUNTER — Encounter (INDEPENDENT_AMBULATORY_CARE_PROVIDER_SITE_OTHER): Payer: Self-pay | Admitting: *Deleted

## 2012-10-03 DIAGNOSIS — N390 Urinary tract infection, site not specified: Secondary | ICD-10-CM

## 2012-10-16 ENCOUNTER — Telehealth (INDEPENDENT_AMBULATORY_CARE_PROVIDER_SITE_OTHER): Payer: Self-pay | Admitting: *Deleted

## 2012-10-16 ENCOUNTER — Other Ambulatory Visit (INDEPENDENT_AMBULATORY_CARE_PROVIDER_SITE_OTHER): Payer: Self-pay | Admitting: Internal Medicine

## 2012-10-16 DIAGNOSIS — R188 Other ascites: Secondary | ICD-10-CM

## 2012-10-16 DIAGNOSIS — K7460 Unspecified cirrhosis of liver: Secondary | ICD-10-CM

## 2012-10-16 NOTE — Telephone Encounter (Signed)
Per Dr.Rehman the patient need to have another TAP this week. Forwarded to Lelon Frohlich to arrange, then patient will be contacted.

## 2012-10-16 NOTE — Telephone Encounter (Signed)
[name]'s boyfriend came by and said he was worried about [name]. She had a tap last Friday, 10/12/12, and was blown back up on Sat. 10/13/12. She is needing another tap or needs to be seen. [name] feels so back she is laying in the bed. Please give her a call at

## 2012-10-16 NOTE — Telephone Encounter (Addendum)
[name]'s boyfriend came by and said he was worried about [name]. She had a tap last Friday, 10/12/12, and was blown back up by Sat. 10/13/12. She is needing another tap or needs to be seen. Qamar feels so back she is laying in bed. Please give her a call at [phone]. Boyfriend's name was Coralyn Mark at [phone].

## 2012-10-16 NOTE — Telephone Encounter (Signed)
I got [name] sch'd for tap on 10/19/12, when I called to give her appointment information her boyfriend Coralyn Mark stated that she was going to wait until she went back to [facility], that they ([facility]) had called her back and she was to go see them this Thursday (9/11) -- I have canceled tap sch'd for 10/19/12

## 2012-10-17 ENCOUNTER — Telehealth (INDEPENDENT_AMBULATORY_CARE_PROVIDER_SITE_OTHER): Payer: Self-pay | Admitting: *Deleted

## 2012-10-17 DIAGNOSIS — K7460 Unspecified cirrhosis of liver: Secondary | ICD-10-CM

## 2012-10-17 DIAGNOSIS — R188 Other ascites: Secondary | ICD-10-CM

## 2012-10-17 DIAGNOSIS — B182 Chronic viral hepatitis C: Secondary | ICD-10-CM

## 2012-10-17 NOTE — Telephone Encounter (Signed)
This lab is a request of Dr. Nilda Calamity @ [facility]. They ask that Results be faxed to [phone].

## 2012-10-17 NOTE — Telephone Encounter (Signed)
They ask that it be drawn on 10/22/12.

## 2012-10-19 ENCOUNTER — Ambulatory Visit (HOSPITAL_COMMUNITY): Payer: BC Managed Care – PPO

## 2012-10-20 ENCOUNTER — Emergency Department (HOSPITAL_COMMUNITY): Payer: BC Managed Care – PPO

## 2012-10-20 ENCOUNTER — Emergency Department (HOSPITAL_COMMUNITY)
Admission: EM | Admit: 2012-10-20 | Discharge: 2012-10-20 | Disposition: A | Discharge status: Home / Self Care | Payer: BC Managed Care – PPO | Attending: Emergency Medicine | Admitting: Emergency Medicine

## 2012-10-20 ENCOUNTER — Encounter (HOSPITAL_COMMUNITY): Payer: Self-pay | Admitting: *Deleted

## 2012-10-20 DIAGNOSIS — Z79899 Other long term (current) drug therapy: Secondary | ICD-10-CM | POA: Insufficient documentation

## 2012-10-20 DIAGNOSIS — Y92009 Unspecified place in unspecified non-institutional (private) residence as the place of occurrence of the external cause: Secondary | ICD-10-CM | POA: Insufficient documentation

## 2012-10-20 DIAGNOSIS — Z87891 Personal history of nicotine dependence: Secondary | ICD-10-CM | POA: Insufficient documentation

## 2012-10-20 DIAGNOSIS — Z862 Personal history of diseases of the blood and blood-forming organs and certain disorders involving the immune mechanism: Secondary | ICD-10-CM | POA: Insufficient documentation

## 2012-10-20 DIAGNOSIS — S92301A Fracture of unspecified metatarsal bone(s), right foot, initial encounter for closed fracture: Secondary | ICD-10-CM

## 2012-10-20 DIAGNOSIS — W1789XA Other fall from one level to another, initial encounter: Secondary | ICD-10-CM | POA: Insufficient documentation

## 2012-10-20 DIAGNOSIS — K7460 Unspecified cirrhosis of liver: Secondary | ICD-10-CM | POA: Insufficient documentation

## 2012-10-20 DIAGNOSIS — Z87442 Personal history of urinary calculi: Secondary | ICD-10-CM | POA: Insufficient documentation

## 2012-10-20 DIAGNOSIS — Y9389 Activity, other specified: Secondary | ICD-10-CM | POA: Insufficient documentation

## 2012-10-20 DIAGNOSIS — S92309A Fracture of unspecified metatarsal bone(s), unspecified foot, initial encounter for closed fracture: Secondary | ICD-10-CM | POA: Insufficient documentation

## 2012-10-20 DIAGNOSIS — S52509A Unspecified fracture of the lower end of unspecified radius, initial encounter for closed fracture: Secondary | ICD-10-CM | POA: Insufficient documentation

## 2012-10-20 DIAGNOSIS — R55 Syncope and collapse: Secondary | ICD-10-CM | POA: Insufficient documentation

## 2012-10-20 DIAGNOSIS — Z8679 Personal history of other diseases of the circulatory system: Secondary | ICD-10-CM | POA: Insufficient documentation

## 2012-10-20 DIAGNOSIS — S5291XA Unspecified fracture of right forearm, initial encounter for closed fracture: Secondary | ICD-10-CM

## 2012-10-20 DIAGNOSIS — S52611A Displaced fracture of right ulna styloid process, initial encounter for closed fracture: Secondary | ICD-10-CM

## 2012-10-20 DIAGNOSIS — Z792 Long term (current) use of antibiotics: Secondary | ICD-10-CM | POA: Insufficient documentation

## 2012-10-20 HISTORY — DX: Hepatic failure, unspecified without coma: K72.90

## 2012-10-20 HISTORY — DX: Portal vein thrombosis: I81

## 2012-10-20 LAB — HEPATIC FUNCTION PANEL
ALT: 54 U/L — ABNORMAL HIGH (ref 0–35)
AST: 44 U/L — ABNORMAL HIGH (ref 0–37)
Albumin: 3.3 g/dL — ABNORMAL LOW (ref 3.5–5.2)
Alkaline Phosphatase: 194 U/L — ABNORMAL HIGH (ref 39–117)
Total Protein: 7.1 g/dL (ref 6.0–8.3)

## 2012-10-20 LAB — BASIC METABOLIC PANEL
CO2: 30 mEq/L (ref 19–32)
Chloride: 94 mEq/L — ABNORMAL LOW (ref 96–112)
Creatinine, Ser: 0.89 mg/dL (ref 0.50–1.10)
Sodium: 131 mEq/L — ABNORMAL LOW (ref 135–145)

## 2012-10-20 LAB — CBC
MCV: 87.3 fL (ref 78.0–100.0)
Platelets: 42 10*3/uL — ABNORMAL LOW (ref 150–400)
RBC: 3.7 MIL/uL — ABNORMAL LOW (ref 3.87–5.11)
WBC: 5.2 10*3/uL (ref 4.0–10.5)

## 2012-10-20 MED ORDER — ONDANSETRON HCL 4 MG/2ML IJ SOLN
4.0000 mg | Freq: Once | INTRAMUSCULAR | Status: AC
  Administered 2012-10-20: 4 mg via INTRAVENOUS
  Filled 2012-10-20: qty 2

## 2012-10-20 MED ORDER — SODIUM CHLORIDE 0.9 % IV BOLUS (SEPSIS): 500.0000 mL | Freq: Once | INTRAVENOUS | Status: DC

## 2012-10-20 MED ORDER — HYDROMORPHONE HCL PF 1 MG/ML IJ SOLN
1.0000 mg | Freq: Once | INTRAMUSCULAR | Status: AC
  Administered 2012-10-20: 1 mg via INTRAVENOUS
  Filled 2012-10-20: qty 1

## 2012-10-20 MED ORDER — LIDOCAINE HCL (PF) 1 % IJ SOLN
10.0000 mL | Freq: Once | INTRAMUSCULAR | Status: AC
  Administered 2012-10-20: 10 mL
  Filled 2012-10-20 (×2): qty 5

## 2012-10-20 MED ORDER — OXYCODONE HCL 5 MG PO TABS
5.0000 mg | ORAL_TABLET | Freq: Once | ORAL | Status: AC
  Administered 2012-10-20: 5 mg via ORAL
  Filled 2012-10-20: qty 1

## 2012-10-20 MED ORDER — OXYCODONE HCL 5 MG PO TABS: 5.0000 mg | ORAL_TABLET | ORAL | Status: DC | PRN

## 2012-10-20 NOTE — ED Notes (Signed)
Passed out at home, fell on floor onto R arm.  Only lasted "a second or two."  On liver transplant list, recently had paracentesis and is on diuretics.

## 2012-10-20 NOTE — ED Provider Notes (Signed)
CSN: [id number]     Arrival date & time 10/20/12  1903 History  This chart was scribed for Natasha Hamburger, MD by Chesley Mires, ED Scribe. This patient was seen in room APA12/APA12 and the patient's care was started at 8:51 PM.    Chief Complaint  Patient presents with  . Fall  . Arm Pain   The history is provided by the patient. No language interpreter was used.   HPI Comments: [name] is a 52 y.o. female who presents to the Emergency Department complaining of constant, moderate, right wrist pain onset after falling earlier today. Pt reports that she was trying to get up from the couch when she fell to the ground and landed on her right arm. Pt has associated mild, constant right foot pain also onset after the fall. Pt denies any previous history of falls. Pt states she is on a liver transplant list, due to her cirrhosis, and has had paracentesis recently and is currently on diuretics. 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 Pt has a family history of cirrhosis. Pt denies a history of alcohol abuse, or cardiac problems.Pt denies diaphoresis, abdominal pain, light-headedness and chest pain, or any other symptoms.  Past Medical History  Diagnosis Date  . Esophageal varices   . Cirrhosis     non alcoholic  . Ascites   . Anemia   . Kidney stones   . Liver failure   . Portal vein thrombosis    Past Surgical History  Procedure Laterality Date  . Abdominal hysterectomy    . Cholecystectomy    . Upper gastrointestinal endoscopy    . Colonoscopy    . Hernia    . Gastric bypass    . Cesarean section    . Hernia repair  04/14/11    Patient states that she hs had 4 total, with most recent  being 04/14/11.  . Lithotripsy    . Knee arthroscopy      Right knee  . Colonoscopy  10/21/2011    Procedure: COLONOSCOPY;  Surgeon: Rogene Houston, MD;  Location: [facility];  Service: Endoscopy;   Laterality: N/A;  245   . Cystoscopy with stent placement  12/08/2011    Procedure: CYSTOSCOPY WITH STENT PLACEMENT;  Surgeon: Marissa Nestle, MD;  Location: [facility];  Service: Urology;  Laterality: Right;  . Cystoscopy w/ retrogrades  12/08/2011    Procedure: CYSTOSCOPY WITH RETROGRADE PYELOGRAM;  Surgeon: Marissa Nestle, MD;  Location: [facility];  Service: Urology;  Laterality: Right;   Family History  Problem Relation Age of Onset  . Dementia Mother   . Heart disease Father   . Liver disease Father   . Hypertension Father   . Diabetes Father   . Dementia Father   . Hypertension Brother    History  Substance Use Topics  . Smoking status: Former Smoker    Types: Cigarettes    Quit date: 05/02/1991  . Smokeless tobacco: Never Used  . Alcohol Use: No   OB History   Grav Para Term Preterm Abortions TAB SAB Ect Mult Living                 Review of Systems  Constitutional: Negative for diaphoresis.  Respiratory: Negative for shortness of breath.   Cardiovascular: Negative for chest pain.  Gastrointestinal: Negative for vomiting, abdominal pain, diarrhea and  blood in stool.  Genitourinary: Negative for dysuria.  Musculoskeletal: Positive for arthralgias (right wrist and right foot).  Neurological: Positive for syncope. Negative for weakness and light-headedness.  All other systems reviewed and are negative.    Allergies  Ancef  Home Medications   Current Outpatient Rx  Name  Route  Sig  Dispense  Refill  . b complex vitamins tablet   Oral   Take 1 tablet by mouth daily.         . ciprofloxacin (CIPRO) 500 MG tablet   Oral   Take 1 tablet (500 mg total) by mouth daily.   30 tablet   2   . cyclobenzaprine (FLEXERIL) 10 MG tablet   Oral   Take 10 mg by mouth 3 (three) times daily as needed for muscle spasms.         . famciclovir (FAMVIR) 500 MG tablet   Oral   Take 1 tablet (500 mg total) by mouth 3 (three) times daily.   21 tablet   0   .  furosemide (LASIX) 40 MG tablet   Oral   Take 60 mg by mouth every morning.          . lactulose (CHRONULAC) 10 GM/15ML solution   Oral   Take 30 g by mouth daily as needed (Constipation).         . mirtazapine (REMERON) 15 MG tablet   Oral   Take 15 mg by mouth at bedtime.         . Multiple Vitamin (MULTIVITAMIN) capsule   Oral   Take 1 capsule by mouth daily.         . nadolol (CORGARD) 20 MG tablet   Oral   Take 10 mg by mouth every morning.          Marland Kitchen omeprazole (PRILOSEC) 20 MG capsule   Oral   Take 20 mg by mouth 2 (two) times daily.          Marland Kitchen oxyCODONE-acetaminophen (PERCOCET/ROXICET) 5-325 MG per tablet   Oral   Take 1 tablet by mouth every 6 (six) hours as needed for pain.   20 tablet   0   . rifaximin (XIFAXAN) 550 MG TABS   Oral   Take 550 mg by mouth 2 (two) times daily.         Marland Kitchen spironolactone (ALDACTONE) 100 MG tablet   Oral   Take 300 mg by mouth every morning.          . traZODone (DESYREL) 50 MG tablet   Oral   Take 1 tablet (50 mg total) by mouth at bedtime.   30 tablet   5    Triage Vitals: BP 125/60  Pulse 87  Temp(Src) 97.6 F (36.4 C) (Oral)  Resp 20  Ht 5\' 6"  (1.676 m)  Wt 150 lb (68.04 kg)  BMI 24.22 kg/m2  SpO2 100%  Physical Exam  Nursing note and vitals reviewed. Constitutional: She is oriented to person, place, and time. She appears well-developed and well-nourished. No distress.  HENT:  Head: Normocephalic and atraumatic.  Mouth/Throat: Oropharynx is clear and moist.  Eyes: EOM are normal.  Neck: Neck supple. No tracheal deviation present.  Cardiovascular: Normal rate, regular rhythm and intact distal pulses.   No murmur heard. Pulses:      Radial pulses are 2+ on the right side, and 2+ on the left side.  Radial pulse 2+ in right hand  Pulmonary/Chest: Effort normal and breath sounds normal. No  respiratory distress.  Abdominal: She exhibits distension. There is no tenderness.  Musculoskeletal: Normal  range of motion. She exhibits tenderness (right foot).       Right wrist: She exhibits tenderness, bony tenderness, swelling and deformity.  Deformity to the right wrist  Neurological: She is alert and oriented to person, place, and time.  Neurovascularly intact, no sensory deficits, able to wiggle fingers  Skin: Skin is warm and dry.  Ecchymosis on lateral aspect of the right foot.  Psychiatric: She has a normal mood and affect. Her behavior is normal.    ED Course  Reduction of fracture Date/Time: 10/20/2012 11:08 PM Performed by: Sherwood Gambler T Authorized by: Sherwood Gambler T Consent: Verbal consent obtained. Risks and benefits: risks, benefits and alternatives were discussed Consent given by: patient Preparation: Patient was prepped and draped in the usual sterile fashion. Local anesthesia used: yes Anesthesia: hematoma block Local anesthetic: lidocaine 1% without epinephrine Anesthetic total: 10 ml Patient sedated: no Patient tolerance: Patient tolerated the procedure well with no immediate complications. Comments: Right radius fracture noted to be improved s/p reduction. Splint applied by nurse at bedside   (including critical care time)  DIAGNOSTIC STUDIES: Oxygen Saturation is 100% on room air, normal by my interpretation.    COORDINATION OF CARE: 8:58 PM- Advised pt of radiology findings indicating a distal right radial fracture, which will need to be reduced. Informed pt that more radiology will need to be obtained following that. Will order IV fluids, Dilaudid, Zofran, and Xylocaine. Discussed plan to order diagnostic labs. Pt understands and agrees to this treatment plan.  Medications  sodium chloride 0.9 % bolus 500 mL (not administered)  lidocaine (PF) (XYLOCAINE) 1 % injection 10 mL (not administered)  HYDROmorphone (DILAUDID) injection 1 mg (1 mg Intravenous Given 10/20/12 2140)  ondansetron (ZOFRAN) injection 4 mg (4 mg Intravenous Given 10/20/12 2140)   HYDROmorphone (DILAUDID) injection 1 mg (1 mg Intravenous Given 10/20/12 2216)   Labs Review Labs Reviewed  CBC - Abnormal; Notable for the following:    RBC 3.70 (*)    Hemoglobin 11.0 (*)    HCT 32.3 (*)    RDW 15.8 (*)    Platelets 42 (*)    All other components within normal limits  BASIC METABOLIC PANEL - Abnormal; Notable for the following:    Sodium 131 (*)    Chloride 94 (*)    Glucose, Bld 182 (*)    GFR calc non Af Amer 74 (*)    GFR calc Af Amer 86 (*)    All other components within normal limits  HEPATIC FUNCTION PANEL - Abnormal; Notable for the following:    Albumin 3.3 (*)    AST 44 (*)    ALT 54 (*)    Alkaline Phosphatase 194 (*)    Total Bilirubin 1.3 (*)    Bilirubin, Direct 0.5 (*)    All other components within normal limits  TROPONIN I    Date: 10/20/2012  Rate: 77  Rhythm: normal sinus rhythm  QRS Axis: normal  Intervals: normal  ST/T Wave abnormalities: nonspecific T wave changes  Conduction Disutrbances:none  Narrative Interpretation:   Old EKG Reviewed: unchanged   Imaging Review Dg Forearm Right  10/20/2012   CLINICAL DATA:  Distal wrist pain. Fall.  EXAM: RIGHT FOREARM - 2 VIEW  COMPARISON:  None.  FINDINGS: There is a comminuted intra-articular distal left radial fracture as seen on the wrist series. Mild impaction and angulation. Ulnar styloid fracture seen on the  wrist series is difficult to visualize. No additional forearm abnormality.  IMPRESSION: Distal right radial and ulnar styloid fractures, better seen and characterized on wrist series.   Electronically Signed   By: Rolm Baptise M.D.   On: 10/20/2012 19:52   Dg Wrist Complete Right  10/20/2012   CLINICAL DATA:  Post fall, wrist pain  EXAM: RIGHT WRIST - COMPLETE 3+ VIEW  COMPARISON:  None.  FINDINGS: Three views of the right wrist submitted. There is impacted mild displaced comminuted fracture of distal right radial metaphysis. Displaced fracture of the ulnar styloid.  IMPRESSION:  There is impacted mild displaced comminuted fracture of distal right radial metaphysis. Displaced fracture of ulnar styloid.   Electronically Signed   By: Lahoma Crocker   On: 10/20/2012 19:52   Dg Chest Port 1 View  10/20/2012   CLINICAL DATA:  Fall.  EXAM: PORTABLE CHEST - 1 VIEW  COMPARISON:  12/06/2011  FINDINGS: The heart size and mediastinal contours are within normal limits. Both lungs are clear. The visualized skeletal structures are unremarkable.  IMPRESSION: No active disease.   Electronically Signed   By: Rolm Baptise M.D.   On: 10/20/2012 21:49   Dg Foot Complete Right  10/20/2012   CLINICAL DATA:  Fall, medial foot pain, swelling.  EXAM: RIGHT FOOT COMPLETE - 3+ VIEW  COMPARISON:  None.  FINDINGS: There is an oblique fracture through the right 5th metatarsal. Minimal displacement. No additional acute bony abnormality.  IMPRESSION: Oblique right 5th metatarsal fracture.   Electronically Signed   By: Rolm Baptise M.D.   On: 10/20/2012 21:51    MDM   1. Syncope   2. Radius fracture, right, closed, initial encounter   3. Fracture of ulnar styloid, right, closed, initial encounter   4. Fracture of fifth metatarsal bone, right, closed, initial encounter    Patient appears well here. No murmurs, chest pain, dyspnea, or headaches to suggest a more pathologic cause of syncope. Patient relates today that she has been feeling dehydrated recently due to her diuretics and paracentesis drainages. She had very little sleep last night. This occurred right after standing subluxes with orthostatic syncope. I doubt that she had a cardiac event. Her wrist fracture was reduced at the bedside. I discussed with Dr. Aline Brochure about the reduction and how it is still mildly displaced. He states this is okay and will splint and have her followup with him closely on an outpatient setting. Her pain was controlled in the ED, will discharge with oxycodone without Tylenol due to her liver disease. I discussed strict return  precautions the patient wants to go home. We'll also place her right foot in a boot per Dr. Ruthe Mannan suggestion.  I personally performed the services described in this documentation, which was scribed in my presence. The recorded information has been reviewed and is accurate.    Natasha Hamburger, MD 10/20/12 [phone]

## 2012-10-20 NOTE — ED Notes (Signed)
Attempted to give pain medication at discharge. Patient took medication and wrapped it in a tissue and put in her pocket and stated "I wanted to wait til I got home to take it. I know when this medicine wears off, I will need it so I'm gonna take it when I get home."

## 2012-10-22 ENCOUNTER — Telehealth: Payer: Self-pay | Admitting: Orthopedic Surgery

## 2012-10-22 NOTE — Telephone Encounter (Signed)
Call received from person who states he is patient's Myrlene Broker, [phone], requesting to schedule appointment with Dr Aline Brochure as follow up from Emergency Room.  He states patient is asleep, unable to speak with our office.  Due to [employer], I relayed that we will need patient to give authorization to speak with our office.  He will ask her to call us when she awakens, regarding scheduling appointment.

## 2012-10-23 NOTE — Telephone Encounter (Signed)
I had called back on 10/22/12, and left patient a voice message.  Also had received note from Dr. Aline Brochure to schedule it.  In the meantime, [name] received a call back from patient and scheduled appointment for Thursday, 10/25/12, which Dr. Aline Brochure approved.  Patient aware.

## 2012-10-25 ENCOUNTER — Encounter: Payer: Self-pay | Admitting: Orthopedic Surgery

## 2012-10-25 ENCOUNTER — Ambulatory Visit (INDEPENDENT_AMBULATORY_CARE_PROVIDER_SITE_OTHER): Payer: BC Managed Care – PPO | Admitting: Orthopedic Surgery

## 2012-10-25 VITALS — BP 126/70 | Ht 66.0 in | Wt 148.0 lb

## 2012-10-25 DIAGNOSIS — S92309A Fracture of unspecified metatarsal bone(s), unspecified foot, initial encounter for closed fracture: Secondary | ICD-10-CM | POA: Insufficient documentation

## 2012-10-25 DIAGNOSIS — S52599A Other fractures of lower end of unspecified radius, initial encounter for closed fracture: Secondary | ICD-10-CM

## 2012-10-25 DIAGNOSIS — S92301A Fracture of unspecified metatarsal bone(s), right foot, initial encounter for closed fracture: Secondary | ICD-10-CM

## 2012-10-25 DIAGNOSIS — S52509A Unspecified fracture of the lower end of unspecified radius, initial encounter for closed fracture: Secondary | ICD-10-CM | POA: Insufficient documentation

## 2012-10-25 DIAGNOSIS — S52501A Unspecified fracture of the lower end of right radius, initial encounter for closed fracture: Secondary | ICD-10-CM

## 2012-10-25 MED ORDER — OXYCODONE HCL 5 MG PO TABS: 5.0000 mg | ORAL_TABLET | ORAL | Status: DC | PRN

## 2012-10-25 NOTE — Progress Notes (Signed)
Patient ID: [name], female   DOB: 1960-03-27, 52 y.o.   MRN: [id number]  Chief Complaint  Patient presents with  . Wrist Pain    Right wrist fracture d/t injury 10/20/12    5 days ago this patient fractured her right wrist she has a history of cirrhosis otherwise healthy. Stood up fell and fractured her right foot and her right wrist she has a fifth metatarsal shaft fracture and a comminuted distal right radius fracture   Review of systems she listed all of her systems is negative but am sure she does have some issues with her cirrhosis medications are Cipro lactone, Lasix 40 twice a day, XIFAXAN. 500 mg twice a day  Past Medical History  Diagnosis Date  . Esophageal varices   . Cirrhosis     non alcoholic  . Ascites   . Anemia   . Kidney stones   . Liver failure   . Portal vein thrombosis     Past Surgical History  Procedure Laterality Date  . Abdominal hysterectomy    . Cholecystectomy    . Upper gastrointestinal endoscopy    . Colonoscopy    . Hernia    . Gastric bypass    . Cesarean section    . Hernia repair  04/14/11    Patient states that she hs had 4 total, with most recent  being 04/14/11.  . Lithotripsy    . Knee arthroscopy      Right knee  . Colonoscopy  10/21/2011    Procedure: COLONOSCOPY;  Surgeon: Rogene Houston, MD;  [facility];  Service: Endoscopy;  Laterality: N/A;  245   . Cystoscopy with stent placement  12/08/2011    Procedure: CYSTOSCOPY WITH STENT PLACEMENT;  Surgeon: Marissa Nestle, MD;  Location: [facility];  Service: Urology;  Laterality: Right;  . Cystoscopy w/ retrogrades  12/08/2011    Procedure: CYSTOSCOPY WITH RETROGRADE PYELOGRAM;  Surgeon: Marissa Nestle, MD;  Location: [facility];  Service: Urology;  Laterality: Right;    Family History  Problem Relation Age of Onset  . Dementia Mother   . Heart disease Father   . Liver disease Father   . Hypertension Father   . Diabetes Father   . Dementia Father   . Hypertension  Brother    History  Substance Use Topics  . Smoking status: Former Smoker    Types: Cigarettes    Quit date: 05/02/1991  . Smokeless tobacco: Never Used  . Alcohol Use: No     BP 126/70  Ht 5\' 6"  (1.676 m)  Wt 148 lb (67.132 kg)  BMI 23.9 kg/m2  Small frame well-developed well-nourished grooming hygiene normal oriented x3 mood and affect normal ambulation marked antalgic gait favoring the right leg with a Cam Walker  Right foot is tender swollen over the fracture site and dorsal portion of the foot this Effexor range of motion at the ankle joint although it is passively normal the ankle joint itself is stable her motor function is normal her skin is ecchymotic but intact distal neurologic function is intact as well as vascular function. No lymphadenopathy is noted deep tendon reflexes on that side are deferred otherwise normal balance defer balance testing secondary to Cam Walker  Left upper extremity normal range of motion stability strength skin and alignment  Left lower extremity normal range of motion strength stability and alignment skin normal  Right wrist slight deformity is noted tenderness at the fracture site she is  very swollen she bled a lot in the subcutaneous tissue which is ecchymotic but intact motor function is deferred for testing joints are stable without subluxation  X-rays of the wrist show comminuted distal radius fracture with intra-articular extension slight dorsal angulation without displacement  Fifth metatarsal fracture nondisplaced fractures of the shaft  Diagnosis intra-articular distal radius fracture right wrist Diagnosis fifth metatarsal fracture right foot  I took off her right wrist splint and place her in a volar splint  Recommend open treatment internal fixation right wrist  Continue Cam Walker right foot  She will need a preop clearance from her medical doctor due to the syncopal episode

## 2012-10-25 NOTE — Patient Instructions (Addendum)
Surgery right wrist

## 2012-10-26 ENCOUNTER — Telehealth: Payer: Self-pay | Admitting: *Deleted

## 2012-10-26 NOTE — Telephone Encounter (Signed)
I called [facility] on 10/26/12 and ask them to schedule an appointment for [name] ASAP, due to patient needs surgery for fracture, however, must be medically cleared. The receptionist there advised me that they had some availability for today, and she would contact the patient. I called the patient to let her know, and she stated that [name] had already called her,and she was on her way. I asked her to stop by here and pick up her prescription for her pain medicine that she forgot to get yesterday when she was in the office.

## 2012-10-29 ENCOUNTER — Encounter (HOSPITAL_COMMUNITY): Payer: Self-pay | Admitting: Pharmacy Technician

## 2012-10-29 ENCOUNTER — Other Ambulatory Visit: Payer: Self-pay | Admitting: *Deleted

## 2012-10-29 ENCOUNTER — Telehealth: Payer: Self-pay | Admitting: Orthopedic Surgery

## 2012-10-29 NOTE — Patient Instructions (Addendum)
[name]  10/29/2012   Your procedure is scheduled on:  11/02/2012  Report to [facility] at  740  AM.  Call this number if you have problems the morning of surgery: [phone]   Remember:   Do not eat food or drink liquids after midnight.   Take these medicines the morning of surgery with A SIP OF WATER:  Flexaril,famvir, corgard, prilosec, oxycodone, xifaxan, aldactone   Do not wear jewelry, make-up or nail polish.  Do not wear lotions, powders, or perfumes.   Do not shave 48 hours prior to surgery. Men may shave face and neck.  Do not bring valuables to the hospital.  [facility] is not responsible for any belongings or valuables.  Contacts, dentures or bridgework may not be worn into surgery.  Leave suitcase in the car. After surgery it may be brought to your room.  For patients admitted to the hospital, checkout time is 11:00 AM the day of discharge.   Patients discharged the day of surgery will not be allowed to drive home.  Name and phone number of your driver: family  Special Instructions: Shower using CHG 2 nights before surgery and the night before surgery.  If you shower the day of surgery use CHG.  Use special wash - you have one bottle of CHG for all showers.  You should use approximately 1/3 of the bottle for each shower.   Please read over the following fact sheets that you were given: Pain Booklet, Coughing and Deep Breathing, Surgical Site Infection Prevention, Anesthesia Post-op Instructions and Care and Recovery After Surgery Wrist Fracture A wrist fracture is a break in one of the bones of the wrist. Your wrist is made up of several small bones at the palm of your hand (carpal bones) and the two bones that make up your forearm (radius and ulna). The bones come together to form multiple large and small joints. The shape and design of these joints allow your wrist to bend and straighten, move side-to-side, and rotate, as in twisting your palm up or down. CAUSES   A fracture may occur in any of the bones in your wrist when enough force is applied to the wrist, such as when falling down onto an outstretched hand. Severe injuries may occur from a more forceful injury. SYMPTOMS Symptoms of wrist fractures include tenderness, bruising, and swelling. Additionally, the wrist may hang in an odd position or may be misshaped. DIAGNOSIS To diagnose a wrist fracture, your caregiver will physically examine your wrist. Your caregiver may also request an X-ray exam of your wrist. TREATMENT Treatment depends on many factors, including the nature and location of the fracture, your age, and your activity level. Treatment for wrist fracture can be nonsurgical or surgical. For nonsurgical treatment, a plaster cast or splint may be applied to your wrist if the bone is in a good position (aligned). The cast will stay on for about 6 weeks. If the alignment of your bone is not good, it may be necessary to realign (reduce) it. After the bone is reduced, a splint usually is placed on your wrist to allow for a small amount of normal swelling. After about 1 week, the splint is removed and a cast is added. The cast is removed 2 or 3 weeks later, after the swelling goes down, causing the cast to loosen. Another cast is applied. This cast is removed after about another 2 or 3 weeks, for a total of 4  to 6 weeks of immobilization. Sometimes the position of the bone is so far out of place that surgery is required to apply a device to hold it together as it heals. If the bone cannot be reduced without cutting the skin around the bone (closed reduction), a cut (incision) is made to allow direct access to the bone to reduce it (open reduction). Depending on the fracture, there are a number of options for holding the bone in place while it heals, including a cast, metal pins, a plate and screws, and a device called an external fixator. With an external fixator, most of the hardware remains outside of  the body. HOME CARE INSTRUCTIONS  To lessen swelling, keep your injured wrist elevated and move your fingers as much as possible.  Apply ice to your wrist for the first 1 to 2 days after you have been treated or as directed by your caregiver. Applying ice helps to reduce inflammation and pain.  Put ice in a plastic bag.  Place a towel between your skin and the bag.  Leave the ice on for 15 to 20 minutes at a time, every 2 hours while you are awake.  Do not put pressure on any part of your cast or splint. It may break.  Use a plastic bag to protect your cast or splint from water while bathing or showering. Do not lower your cast or splint into water.  Only take over-the-counter or prescription medicines for pain as directed by your caregiver. SEEK IMMEDIATE MEDICAL CARE IF:   Your cast or splint gets damaged or breaks.  You have continued severe pain or more swelling than you did before the cast was put on.  Your skin or fingernails below the injury turn blue or gray or feel cold or numb.  You develop decreased feeling in your fingers. MAKE SURE YOU:  Understand these instructions.  Will watch your condition.  Will get help right away if you are not doing well or get worse. Document Released: 11/03/2004 Document Revised: 04/18/2011 Document Reviewed: 02/11/2011 [facility] Patient Information 2014 [place], [place]. PATIENT INSTRUCTIONS POST-ANESTHESIA  IMMEDIATELY FOLLOWING SURGERY:  Do not drive or operate machinery for the first twenty four hours after surgery.  Do not make any important decisions for twenty four hours after surgery or while taking narcotic pain medications or sedatives.  If you develop intractable nausea and vomiting or a severe headache please notify your doctor immediately.  FOLLOW-UP:  Please make an appointment with your surgeon as instructed. You do not need to follow up with anesthesia unless specifically instructed to do so.  WOUND CARE INSTRUCTIONS  (if applicable):  Keep a dry clean dressing on the anesthesia/puncture wound site if there is drainage.  Once the wound has quit draining you may leave it open to air.  Generally you should leave the bandage intact for twenty four hours unless there is drainage.  If the epidural site drains for more than 36-48 hours please call the anesthesia department.  QUESTIONS?:  Please feel free to call your physician or the hospital operator if you have any questions, and they will be happy to assist you.

## 2012-10-29 NOTE — Telephone Encounter (Signed)
[place] at ph# [phone], regarding out-patient surgery scheduled Nov 24, 2012, CPT 25609, ICD9 codes 813.42, 825.25, spoke with [place] - states no pre-authorization is required.  Her name and today's date 10/29/12, 3:36p.m, for reference.

## 2012-10-30 ENCOUNTER — Encounter (HOSPITAL_COMMUNITY)
Admission: RE | Admit: 2012-10-30 | Discharge: 2012-10-30 | Disposition: A | Discharge status: Home / Self Care | Payer: BC Managed Care – PPO | Source: Ambulatory Visit | Attending: Orthopedic Surgery | Admitting: Orthopedic Surgery

## 2012-10-30 ENCOUNTER — Encounter (HOSPITAL_COMMUNITY): Payer: Self-pay

## 2012-11-01 ENCOUNTER — Encounter (HOSPITAL_COMMUNITY): Payer: Self-pay | Admitting: Anesthesiology

## 2012-11-01 NOTE — H&P (Signed)
Chief Complaint   Patient presents with   .  Wrist Pain       Right wrist fracture d/t injury 10/20/12     5 days ago this patient fractured her right wrist she has a history of cirrhosis otherwise healthy. Stood up fell and fractured her right foot and her right wrist she has a fifth metatarsal shaft fracture and a comminuted distal right radius fracture   Review of systems she listed all of her systems is negative but am sure she does have some issues with her cirrhosis medications are Cipro lactone, Lasix 40 twice a day, XIFAXAN. 500 mg twice a day    Past Medical History   Diagnosis  Date   .  Esophageal varices     .  Cirrhosis         non alcoholic   .  Ascites     .  Anemia     .  Kidney stones     .  Liver failure     .  Portal vein thrombosis         Past Surgical History   Procedure  Laterality  Date   .  Abdominal hysterectomy       .  Cholecystectomy       .  Upper gastrointestinal endoscopy       .  Colonoscopy       .  Hernia       .  Gastric bypass       .  Cesarean section       .  Hernia repair    04/14/11       Patient states that she hs had 4 total, with most recent  being 04/14/11.   .  Lithotripsy       .  Knee arthroscopy           Right knee   .  Colonoscopy    10/21/2011       Procedure: COLONOSCOPY;  Surgeon: Rogene Houston, MD;  [facility];  Service: Endoscopy;  Laterality: N/A;  245    .  Cystoscopy with stent placement    12/08/2011       Procedure: CYSTOSCOPY WITH STENT PLACEMENT;  Surgeon: Marissa Nestle, MD;  Location: [facility];  Service: Urology;  Laterality: Right;   .  Cystoscopy w/ retrogrades    12/08/2011       Procedure: CYSTOSCOPY WITH RETROGRADE PYELOGRAM;  Surgeon: Marissa Nestle, MD;  Location: [facility];  Service: Urology;  Laterality: Right;       Family History   Problem  Relation  Age of Onset   .  Dementia  Mother     .  Heart disease  Father     .  Liver disease  Father     .  Hypertension  Father      .  Diabetes  Father     .  Dementia  Father     .  Hypertension  Brother        History   Substance Use Topics   .  Smoking status:  Former Smoker       Types:  Cigarettes       Quit date:  05/02/1991   .  Smokeless tobacco:  Never Used   .  Alcohol Use:  No      BP 126/70  Ht 5\' 6"  (1.676 m)  Wt 148 lb (67.132 kg)  BMI 23.9 kg/m2  Small frame well-developed well-nourished grooming hygiene normal oriented x3 mood and affect normal ambulation marked antalgic gait favoring the right leg with a Cam Walker  Right foot is tender swollen over the fracture site and dorsal portion of the foot this Effexor range of motion at the ankle joint although it is passively normal the ankle joint itself is stable her motor function is normal her skin is ecchymotic but intact distal neurologic function is intact as well as vascular function. No lymphadenopathy is noted deep tendon reflexes on that side are deferred otherwise normal balance defer balance testing secondary to Cam Walker  Left upper extremity normal range of motion stability strength skin and alignment  Left lower extremity normal range of motion strength stability and alignment skin normal  Right wrist slight deformity is noted tenderness at the fracture site she is very swollen she bled a lot in the subcutaneous tissue which is ecchymotic but intact motor function is deferred for testing joints are stable without subluxation  X-rays of the wrist show comminuted distal radius fracture with intra-articular extension slight dorsal angulation without displacement  Fifth metatarsal fracture nondisplaced fractures of the shaft  Diagnosis intra-articular distal radius fracture right wrist Diagnosis fifth metatarsal fracture right foot  I took off her right wrist splint and place her in a volar splint  Recommend open treatment internal fixation right wrist  Continue Cam Walker right foot  She will need a preop clearance from her  medical doctor due to the syncopal episode

## 2012-11-02 ENCOUNTER — Encounter (HOSPITAL_COMMUNITY): Payer: Self-pay | Admitting: Pharmacy Technician

## 2012-11-02 ENCOUNTER — Encounter (HOSPITAL_COMMUNITY)
Admission: AD | Disposition: A | Discharge status: Home / Self Care | Payer: Self-pay | Source: Ambulatory Visit | Attending: Orthopedic Surgery

## 2012-11-02 ENCOUNTER — Ambulatory Visit (HOSPITAL_COMMUNITY)
Admission: AD | Admit: 2012-11-02 | Discharge: 2012-11-02 | Disposition: A | Discharge status: Home / Self Care | Payer: BC Managed Care – PPO | Source: Ambulatory Visit | Attending: Orthopedic Surgery | Admitting: Orthopedic Surgery

## 2012-11-02 ENCOUNTER — Encounter (HOSPITAL_COMMUNITY): Payer: Self-pay | Admitting: *Deleted

## 2012-11-02 DIAGNOSIS — S52539A Colles' fracture of unspecified radius, initial encounter for closed fracture: Secondary | ICD-10-CM | POA: Insufficient documentation

## 2012-11-02 DIAGNOSIS — Z5309 Procedure and treatment not carried out because of other contraindication: Secondary | ICD-10-CM | POA: Insufficient documentation

## 2012-11-02 DIAGNOSIS — Z01812 Encounter for preprocedural laboratory examination: Secondary | ICD-10-CM | POA: Insufficient documentation

## 2012-11-02 DIAGNOSIS — W19XXXA Unspecified fall, initial encounter: Secondary | ICD-10-CM | POA: Insufficient documentation

## 2012-11-02 SURGERY — OPEN REDUCTION INTERNAL FIXATION (ORIF) WRIST FRACTURE
Anesthesia: General

## 2012-11-02 MED ORDER — CHLORHEXIDINE GLUCONATE 4 % EX LIQD: 60.0000 mL | Freq: Once | CUTANEOUS | Status: DC

## 2012-11-02 MED ORDER — PROPOFOL 10 MG/ML IV EMUL
INTRAVENOUS | Status: AC
  Filled 2012-11-02: qty 20

## 2012-11-02 MED ORDER — MIDAZOLAM HCL 2 MG/2ML IJ SOLN
INTRAMUSCULAR | Status: AC
  Filled 2012-11-02: qty 2

## 2012-11-02 MED ORDER — ROCURONIUM BROMIDE 50 MG/5ML IV SOLN
INTRAVENOUS | Status: AC
  Filled 2012-11-02: qty 1

## 2012-11-02 MED ORDER — FENTANYL CITRATE 0.05 MG/ML IJ SOLN
INTRAMUSCULAR | Status: AC
  Filled 2012-11-02: qty 2

## 2012-11-02 MED ORDER — VANCOMYCIN HCL IN DEXTROSE 1-5 GM/200ML-% IV SOLN
1000.0000 mg | INTRAVENOUS | Status: DC
  Filled 2012-11-02: qty 200

## 2012-11-02 MED ORDER — LIDOCAINE HCL (PF) 1 % IJ SOLN
INTRAMUSCULAR | Status: AC
  Filled 2012-11-02: qty 5

## 2012-11-02 MED ORDER — FENTANYL CITRATE 0.05 MG/ML IJ SOLN
INTRAMUSCULAR | Status: AC
  Filled 2012-11-02: qty 5

## 2012-11-02 SURGICAL SUPPLY — 39 items
BAG HAMPER (MISCELLANEOUS) ×3 IMPLANT
BANDAGE ELASTIC 3 VELCRO ST LF (GAUZE/BANDAGES/DRESSINGS) IMPLANT
BANDAGE ESMARK 4X12 BL STRL LF (DISPOSABLE) ×2 IMPLANT
BLADE SURG SZ10 CARB STEEL (BLADE) ×3 IMPLANT
BNDG CMPR 12X4 ELC STRL LF (DISPOSABLE) ×1
BNDG COHESIVE 4X5 TAN NS LF (GAUZE/BANDAGES/DRESSINGS) ×3 IMPLANT
BNDG ESMARK 4X12 BLUE STRL LF (DISPOSABLE) ×2
CHLORAPREP W/TINT 26ML (MISCELLANEOUS) ×3 IMPLANT
CLOTH BEACON ORANGE TIMEOUT ST (SAFETY) ×3 IMPLANT
COVER LIGHT HANDLE STERIS (MISCELLANEOUS) ×6 IMPLANT
COVER PROBE W GEL 5X96 (DRAPES) ×3 IMPLANT
CUFF TOURNIQUET SINGLE 18IN (TOURNIQUET CUFF) ×3 IMPLANT
DRAPE C-ARM FOLDED MOBILE STRL (DRAPES) ×3 IMPLANT
DRSG XEROFORM 1X8 (GAUZE/BANDAGES/DRESSINGS) IMPLANT
ELECT REM PT RETURN 9FT ADLT (ELECTROSURGICAL) ×2
ELECTRODE REM PT RTRN 9FT ADLT (ELECTROSURGICAL) ×2 IMPLANT
GAUZE KERLIX 2  STERILE LF (GAUZE/BANDAGES/DRESSINGS) ×3 IMPLANT
GLOVE SKINSENSE NS SZ8.0 LF (GLOVE) ×1
GLOVE SKINSENSE STRL SZ8.0 LF (GLOVE) ×2 IMPLANT
GLOVE SS N UNI LF 8.5 STRL (GLOVE) ×3 IMPLANT
GOWN STRL REIN XL XLG (GOWN DISPOSABLE) ×12 IMPLANT
INST SET MINOR BONE (KITS) ×3 IMPLANT
KIT ROOM TURNOVER APOR (KITS) ×3 IMPLANT
MANIFOLD NEPTUNE II (INSTRUMENTS) ×3 IMPLANT
NDL HYPO 21X1.5 SAFETY (NEEDLE) ×1 IMPLANT
NEEDLE HYPO 21X1.5 SAFETY (NEEDLE) ×2 IMPLANT
NS IRRIG 1000ML POUR BTL (IV SOLUTION) ×3 IMPLANT
PACK BASIC LIMB (CUSTOM PROCEDURE TRAY) ×3 IMPLANT
PAD ARMBOARD 7.5X6 YLW CONV (MISCELLANEOUS) ×3 IMPLANT
SET BASIN LINEN APH (SET/KITS/TRAYS/PACK) ×3 IMPLANT
SPLINT IMMOBILIZER J 3INX20FT (CAST SUPPLIES) ×1
SPLINT J IMMOBILIZER 3X20FT (CAST SUPPLIES) ×2 IMPLANT
SPONGE GAUZE 4X4 12PLY (GAUZE/BANDAGES/DRESSINGS) ×3 IMPLANT
STAPLER VISISTAT 35W (STAPLE) ×3 IMPLANT
SUT ETHILON 3 0 FSL (SUTURE) IMPLANT
SUT MON AB 2-0 SH 27 (SUTURE) ×2
SUT MON AB 2-0 SH27 (SUTURE) ×2 IMPLANT
SYR CONTROL 10ML LL (SYRINGE) ×3 IMPLANT
WATER STERILE IRR 1000ML POUR (IV SOLUTION) ×3 IMPLANT

## 2012-11-02 NOTE — Anesthesia Preprocedure Evaluation (Signed)
Anesthesia Evaluation  Patient identified by MRN, date of birth, ID band Patient awake    Reviewed: Allergy & Precautions, H&P , NPO status , Patient's Chart, lab work & pertinent test results  Airway Mallampati: I TM Distance: >3 FB     Dental  (+) Teeth Intact   Pulmonary neg pulmonary ROS, former smoker,  breath sounds clear to auscultation        Cardiovascular negative cardio ROS  Rhythm:Regular Rate:Normal     Neuro/Psych    GI/Hepatic (+) Cirrhosis -  Esophageal Varices and ascites     ,   Endo/Other    Renal/GU      Musculoskeletal   Abdominal   Peds  Hematology  (+) Blood dyscrasia (thrombocytopenia), anemia ,   Anesthesia Other Findings   Reproductive/Obstetrics                           Anesthesia Physical Anesthesia Plan Anesthesia Quick Evaluation

## 2012-11-02 NOTE — Progress Notes (Signed)
The patient has been scheduled for open treatment internal fixation distal radius fracture. However, she has a platelet count of 40,000.  Therefore the surgery is being canceled. I will get her in to see her primary care physicians and/or GI specialist and then refer her to a hand surgeon at a tertiary care facility to manage the potential complications related to her liver disease and coagulation.

## 2012-11-05 ENCOUNTER — Telehealth (INDEPENDENT_AMBULATORY_CARE_PROVIDER_SITE_OTHER): Payer: Self-pay | Admitting: Internal Medicine

## 2012-11-05 ENCOUNTER — Other Ambulatory Visit (INDEPENDENT_AMBULATORY_CARE_PROVIDER_SITE_OTHER): Payer: Self-pay | Admitting: Internal Medicine

## 2012-11-05 ENCOUNTER — Ambulatory Visit (INDEPENDENT_AMBULATORY_CARE_PROVIDER_SITE_OTHER): Payer: BC Managed Care – PPO | Admitting: Internal Medicine

## 2012-11-05 ENCOUNTER — Encounter (HOSPITAL_COMMUNITY)
Admission: RE | Admit: 2012-11-05 | Discharge: 2012-11-05 | Disposition: A | Discharge status: Home / Self Care | Payer: BC Managed Care – PPO | Source: Ambulatory Visit | Attending: Orthopedic Surgery | Admitting: Orthopedic Surgery

## 2012-11-05 ENCOUNTER — Encounter (HOSPITAL_COMMUNITY): Payer: Self-pay

## 2012-11-05 ENCOUNTER — Ambulatory Visit: Payer: BC Managed Care – PPO | Admitting: Orthopedic Surgery

## 2012-11-05 HISTORY — DX: Adverse effect of unspecified anesthetic, initial encounter: T41.45XA

## 2012-11-05 HISTORY — DX: Nausea with vomiting, unspecified: R11.2

## 2012-11-05 HISTORY — DX: Cardiac murmur, unspecified: R01.1

## 2012-11-05 HISTORY — DX: Other specified postprocedural states: Z98.890

## 2012-11-05 LAB — COMPREHENSIVE METABOLIC PANEL
ALT: 36 U/L — ABNORMAL HIGH (ref 0–35)
Alkaline Phosphatase: 174 U/L — ABNORMAL HIGH (ref 39–117)
CO2: 30 mEq/L (ref 19–32)
Chloride: 93 mEq/L — ABNORMAL LOW (ref 96–112)
GFR calc Af Amer: >90 mL/min (ref >90)
GFR calc non Af Amer: >90 mL/min (ref >90)
Glucose, Bld: 173 mg/dL — ABNORMAL HIGH (ref 70–99)
Potassium: 2.9 mEq/L — ABNORMAL LOW (ref 3.5–5.1)
Sodium: 132 mEq/L — ABNORMAL LOW (ref 135–145)

## 2012-11-05 LAB — TYPE AND SCREEN: Antibody Screen: NEGATIVE

## 2012-11-05 LAB — CBC
Hemoglobin: 10.7 g/dL — ABNORMAL LOW (ref 12.0–15.0)
RBC: 3.71 MIL/uL — ABNORMAL LOW (ref 3.87–5.11)
WBC: 4.8 10*3/uL (ref 4.0–10.5)

## 2012-11-05 LAB — APTT: aPTT: 32 seconds (ref 24–37)

## 2012-11-05 LAB — ABO/RH: ABO/RH(D): B POS

## 2012-11-05 MED ORDER — POTASSIUM CHLORIDE CRYS ER 20 MEQ PO TBCR: 20.0000 meq | EXTENDED_RELEASE_TABLET | Freq: Three times a day (TID) | ORAL | Status: DC

## 2012-11-05 NOTE — Progress Notes (Signed)
Pt chart left for [place], [place] ( anesthesia) to review abnormal labs.

## 2012-11-05 NOTE — Telephone Encounter (Signed)
Patient called and informed about prescription for potassium. She will take your 60 mEq today and 60 in a.m. She will hold off furosemide in a.m. or take half the dose. Patient is scheduled to undergo surgery for right radial fracture in 2 days. She had preop labs and her potassium was 2.9. I was called about her blood work by Ms. Myra Gianotti, PAC with Dr. Caralyn Guile

## 2012-11-05 NOTE — Progress Notes (Signed)
Pt denies SOB, chest pain, and being under the care of a cardiologist. Pt states that she is under the care of Dr. Hildred Laser for her liver. Pt states that she had a cardiac cath at [facility] in 2007 and a stress test around 2005 in [place]. Ebony Hail, [place] (anesthesia) consulted regarding pt history, EKG, and labs.

## 2012-11-05 NOTE — Telephone Encounter (Addendum)
See updated, separate notes in regard to location and other provider for surgery.

## 2012-11-05 NOTE — Progress Notes (Signed)
Anesthesia PAT Evaluation:  Patient is a 52 year old female scheduled for ORIF right distal radial fracture on 11/07/12 by Dr. Caralyn Guile.  She sustained this fracture and an oblique right 5th metatarsal fracture following a fall on 10/20/12. Case is posted for general anesthesia. I spoke with Dr. Caralyn Guile earlier today.  He would like PLT available IF PLT count is < 50K.  He is planning to use a tourniquet.  (She has known cirrhosis and thrombocytopenia.  Procedure was initially scheduled at [facility] with Dr. 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 Her PCP is Dr. Redmond School. Her fall on 10/20/12 was related to what sounds like brief vasovagal syncope, and Dr. Aline Brochure had already required that she be medially cleared for this procedure--she was seen on 10/26/12 by Delman Cheadle, PA-C of [facility] and medically cleared. Local GI is Dr. Hildred Laser. She is also on a liver transplant list at [facility] (Dr. Lum Keas). She denies any obvious active bleeding.  Paracentesis done sporadically--often based on progressive symptoms which she denies at present.  She reports that she was told that she is not a candidate for TIPS procedure.    EKG on 10/20/12 showed NSR, cannot rule out inferior infarct (age undetermined)--Q wave in lead III and small Q wave (likely insignificant) in aVF.  I think the inferior ST/T wave abnormality is non-specific.  She had inferior Q waves with flat/negative T wave in lead III dating back to a prior EKG on 03/13/02.  She denies chest pain, SOB, LLE  edema (RLE is in soft cast). Exam show heart RRR, no murmur noted.  Lungs clear.  No significant LLE edema.  RLE in soft case, RUE in sling with ecchymosis.    Negative stress Myoview, no evidence of ischemia or infarction, EF 65%, impaired exercise tolerance 03/08/06.    Echo on 02/24/05 showed normal Lv size, vigorous LV systolic function, EF > [id number], mild concentric LVH. Trace TR.  Cardiac cath on 03/12/02 (Dr. Gwenlyn Found) showed normal coronaries, LVEF estimated at > 60% without focal wall motion abnormalities.  1V CXR on 10/20/12 showed no active disease.  Preoperative labs noted.  K 2.9 (she is on furosemide and spironolactone), Cr 0.76, glucose 173.  AST 35, ALT 36. Total Bilirubin 1.6.  H/H 10.7/30.9.  PLT count 60K (up from 42 K on 10/20/12).  (PLT count has been 39-59K since 11/2011.) PT/INR 16.8/1.40.  PTT 32.  K+ and PLT results called to Wells Guiles at Dr. Angus Palms office.  Since PLT count is > 50K then I will defer decision for PLT infusion to surgeon.  I also called and spoke with patient and Dr. Laural Golden.  Patient reports a fairly recent increase in her Lasix dosing which may account for the hypokalemia--although she is also on spironolactone.  Dr. Laural Golden was notified of K+ and PLT count and plans for surgery.  He will contact patient and plans to prescribe 60 mEq KCL today and tomorrow.  I will order an ISTAT for the day of surgery to re-evaluate.    If ISTAT results are reasonable and otherwise no acute changes in her status then  I would anticipate that she could proceed as planned.  She may not be a candidate for any regional anesthesia due to her history of thrombocytopenia. I reviewed with anesthesiologist Dr. Oletta Lamas.    George Hugh [facility]/Anesthesiology Phone [phone] 11/05/2012 4:56 PM

## 2012-11-05 NOTE — Pre-Procedure Instructions (Signed)
[name]  11/05/2012   Your procedure is scheduled on: Wednesday, November 07, 2012  Report to [facility] (use Main Entrance "A'') at 8:15 AM.  Call this number if you have problems the morning of surgery: [phone]   Remember:   Do not eat food or drink liquids after midnight.   Take these medicines the morning of surgery with A SIP OF WATER: rifaximin (XIFAXAN) 550 MG TABS, omeprazole (PRILOSEC) 20 MG capsule, nadolol (CORGARD) 20 MG tablet, ciprofloxacin,  if needed: baclofen (LIORESAL) 10 MG tablet oxyCODONE (ROXICODONE) 5 MG immediate release tablet for pain Stop taking Aspirin, Multiviatins, and herbal medications. Do not take any NSAIDs ie: Ibuprofen, Advil, Naproxen or any medication containing Aspirin.   Do not wear jewelry, make-up or nail polish.  Do not wear lotions, powders, or perfumes. You may wear deodorant.  Do not shave 48 hours prior to surgery.  Do not bring valuables to the hospital.  [facility] is not responsible for any belongings or valuables.               Contacts, dentures or bridgework may not be worn into surgery.  Leave suitcase in the car. After surgery it may be brought to your room.  For patients admitted to the hospital, discharge time is determined by your treatment team.               Patients discharged the day of surgery will not be allowed to drive home.  Name and phone number of your driver:   Special Instructions: Shower using CHG 2 nights before surgery and the night before surgery.  If you shower the day of surgery use CHG.  Use special wash - you have one bottle of CHG for all showers.  You should use approximately 1/3 of the bottle for each shower.   Please read over the following fact sheets that you were given: Pain Booklet, Coughing and Deep Breathing, Blood Transfusion Information and Surgical Site Infection Prevention

## 2012-11-06 MED ORDER — VANCOMYCIN HCL IN DEXTROSE 1-5 GM/200ML-% IV SOLN
1000.0000 mg | INTRAVENOUS | Status: AC
  Administered 2012-11-07: 1000 mg via INTRAVENOUS
  Filled 2012-11-06: qty 200

## 2012-11-07 ENCOUNTER — Ambulatory Visit (HOSPITAL_COMMUNITY)
Admission: RE | Admit: 2012-11-07 | Discharge: 2012-11-09 | Disposition: A | Discharge status: Home / Self Care | Payer: BC Managed Care – PPO | Source: Ambulatory Visit | Attending: Orthopedic Surgery | Admitting: Orthopedic Surgery

## 2012-11-07 ENCOUNTER — Ambulatory Visit (HOSPITAL_COMMUNITY): Payer: BC Managed Care – PPO | Admitting: Anesthesiology

## 2012-11-07 ENCOUNTER — Encounter (HOSPITAL_COMMUNITY): Payer: Self-pay | Admitting: Vascular Surgery

## 2012-11-07 ENCOUNTER — Ambulatory Visit (HOSPITAL_COMMUNITY): Payer: BC Managed Care – PPO

## 2012-11-07 ENCOUNTER — Encounter (HOSPITAL_COMMUNITY): Payer: Self-pay | Admitting: Anesthesiology

## 2012-11-07 ENCOUNTER — Encounter (HOSPITAL_COMMUNITY)
Admission: RE | Disposition: A | Discharge status: Home / Self Care | Payer: Self-pay | Source: Ambulatory Visit | Attending: Orthopedic Surgery

## 2012-11-07 DIAGNOSIS — Z01812 Encounter for preprocedural laboratory examination: Secondary | ICD-10-CM | POA: Insufficient documentation

## 2012-11-07 DIAGNOSIS — Z79899 Other long term (current) drug therapy: Secondary | ICD-10-CM | POA: Insufficient documentation

## 2012-11-07 DIAGNOSIS — D696 Thrombocytopenia, unspecified: Secondary | ICD-10-CM | POA: Insufficient documentation

## 2012-11-07 DIAGNOSIS — K769 Liver disease, unspecified: Secondary | ICD-10-CM | POA: Insufficient documentation

## 2012-11-07 DIAGNOSIS — W19XXXA Unspecified fall, initial encounter: Secondary | ICD-10-CM | POA: Insufficient documentation

## 2012-11-07 DIAGNOSIS — S52599A Other fractures of lower end of unspecified radius, initial encounter for closed fracture: Secondary | ICD-10-CM | POA: Insufficient documentation

## 2012-11-07 DIAGNOSIS — B1920 Unspecified viral hepatitis C without hepatic coma: Secondary | ICD-10-CM | POA: Insufficient documentation

## 2012-11-07 HISTORY — PX: ORIF DISTAL RADIUS FRACTURE: SUR927

## 2012-11-07 HISTORY — DX: Major depressive disorder, single episode, unspecified: F32.9

## 2012-11-07 HISTORY — DX: Type 2 diabetes mellitus without complications: E11.9

## 2012-11-07 HISTORY — DX: Depression, unspecified: F32.A

## 2012-11-07 HISTORY — DX: Anxiety disorder, unspecified: F41.9

## 2012-11-07 HISTORY — DX: Headache: R51

## 2012-11-07 HISTORY — PX: OPEN REDUCTION INTERNAL FIXATION (ORIF) DISTAL RADIAL FRACTURE: SHX5989

## 2012-11-07 LAB — POCT I-STAT 4, (NA,K, GLUC, HGB,HCT)
Glucose, Bld: 126 mg/dL — ABNORMAL HIGH (ref 70–99)
HCT: 33 % — ABNORMAL LOW (ref 36.0–46.0)
Potassium: 3.6 mEq/L (ref 3.5–5.1)

## 2012-11-07 SURGERY — OPEN REDUCTION INTERNAL FIXATION (ORIF) DISTAL RADIUS FRACTURE
Anesthesia: General | Site: Wrist | Laterality: Right | Wound class: Clean

## 2012-11-07 MED ORDER — B COMPLEX-C PO TABS
1.0000 | ORAL_TABLET | Freq: Every day | ORAL | Status: DC
  Administered 2012-11-07 – 2012-11-08 (×2): 1 via ORAL
  Filled 2012-11-07 (×3): qty 1

## 2012-11-07 MED ORDER — PHENYLEPHRINE HCL 10 MG/ML IJ SOLN
INTRAMUSCULAR | Status: DC | PRN
  Administered 2012-11-07 (×2): 80 ug via INTRAVENOUS

## 2012-11-07 MED ORDER — ADULT MULTIVITAMIN W/MINERALS CH
1.0000 | ORAL_TABLET | Freq: Every day | ORAL | Status: DC
  Administered 2012-11-07 – 2012-11-08 (×2): 1 via ORAL
  Filled 2012-11-07 (×3): qty 1

## 2012-11-07 MED ORDER — ONDANSETRON HCL 4 MG/2ML IJ SOLN: 4.0000 mg | Freq: Four times a day (QID) | INTRAMUSCULAR | Status: DC | PRN

## 2012-11-07 MED ORDER — NADOLOL 20 MG PO TABS: 10.0000 mg | ORAL_TABLET | Freq: Every morning | ORAL | Status: DC

## 2012-11-07 MED ORDER — DOCUSATE SODIUM 100 MG PO CAPS: 100.0000 mg | ORAL_CAPSULE | Freq: Two times a day (BID) | ORAL | Status: DC

## 2012-11-07 MED ORDER — DIPHENHYDRAMINE HCL 25 MG PO CAPS
25.0000 mg | ORAL_CAPSULE | Freq: Four times a day (QID) | ORAL | Status: DC | PRN
Start: 2012-11-07 — End: 2012-11-09

## 2012-11-07 MED ORDER — FUROSEMIDE 80 MG PO TABS
80.0000 mg | ORAL_TABLET | Freq: Every day | ORAL | Status: DC
  Administered 2012-11-08: 80 mg via ORAL
  Filled 2012-11-07 (×3): qty 1

## 2012-11-07 MED ORDER — SCOPOLAMINE 1 MG/3DAYS TD PT72
MEDICATED_PATCH | TRANSDERMAL | Status: AC
  Administered 2012-11-07: 1 via TRANSDERMAL
  Filled 2012-11-07: qty 1

## 2012-11-07 MED ORDER — VANCOMYCIN HCL IN DEXTROSE 1-5 GM/200ML-% IV SOLN
1000.0000 mg | Freq: Once | INTRAVENOUS | Status: AC
  Administered 2012-11-07: 1000 mg via INTRAVENOUS
  Filled 2012-11-07: qty 200

## 2012-11-07 MED ORDER — POTASSIUM CHLORIDE CRYS ER 20 MEQ PO TBCR
20.0000 meq | EXTENDED_RELEASE_TABLET | Freq: Three times a day (TID) | ORAL | Status: DC
  Administered 2012-11-07 – 2012-11-08 (×5): 20 meq via ORAL
  Filled 2012-11-07 (×8): qty 1

## 2012-11-07 MED ORDER — VITAMIN C 500 MG PO TABS
1000.0000 mg | ORAL_TABLET | Freq: Every day | ORAL | Status: DC
  Administered 2012-11-07 – 2012-11-08 (×2): 1000 mg via ORAL
  Filled 2012-11-07 (×3): qty 2

## 2012-11-07 MED ORDER — VITAMIN C 500 MG PO TABS: 500.0000 mg | ORAL_TABLET | Freq: Every day | ORAL | Status: DC

## 2012-11-07 MED ORDER — CHLORHEXIDINE GLUCONATE 4 % EX LIQD: 60.0000 mL | Freq: Once | CUTANEOUS | Status: DC

## 2012-11-07 MED ORDER — LIDOCAINE HCL (CARDIAC) 20 MG/ML IV SOLN
INTRAVENOUS | Status: DC | PRN
  Administered 2012-11-07: 40 mg via INTRAVENOUS

## 2012-11-07 MED ORDER — SPIRONOLACTONE 100 MG PO TABS
300.0000 mg | ORAL_TABLET | Freq: Every day | ORAL | Status: DC
  Administered 2012-11-08: 300 mg via ORAL
  Filled 2012-11-07 (×3): qty 3

## 2012-11-07 MED ORDER — LACTATED RINGERS IV SOLN
INTRAVENOUS | Status: DC | PRN
  Administered 2012-11-07 (×2): via INTRAVENOUS

## 2012-11-07 MED ORDER — HYDROMORPHONE HCL PF 1 MG/ML IJ SOLN
0.2500 mg | INTRAMUSCULAR | Status: DC | PRN
  Administered 2012-11-07 (×3): 0.5 mg via INTRAVENOUS

## 2012-11-07 MED ORDER — ONDANSETRON HCL 4 MG PO TABS: 4.0000 mg | ORAL_TABLET | Freq: Four times a day (QID) | ORAL | Status: DC | PRN

## 2012-11-07 MED ORDER — CYCLOBENZAPRINE HCL 10 MG PO TABS: 10.0000 mg | ORAL_TABLET | Freq: Three times a day (TID) | ORAL | Status: DC | PRN

## 2012-11-07 MED ORDER — PHENYLEPHRINE HCL 10 MG/ML IJ SOLN
10.0000 mg | INTRAVENOUS | Status: DC | PRN
  Administered 2012-11-07: 10 ug/min via INTRAVENOUS

## 2012-11-07 MED ORDER — OXYCODONE HCL 10 MG PO TABS: 10.0000 mg | ORAL_TABLET | Freq: Four times a day (QID) | ORAL | Status: DC | PRN

## 2012-11-07 MED ORDER — BUPIVACAINE-EPINEPHRINE 0.25% -1:200000 IJ SOLN
INTRAMUSCULAR | Status: DC | PRN
  Administered 2012-11-07: 30 mL

## 2012-11-07 MED ORDER — RIFAXIMIN 550 MG PO TABS
550.0000 mg | ORAL_TABLET | Freq: Two times a day (BID) | ORAL | Status: DC
  Administered 2012-11-07 – 2012-11-08 (×3): 550 mg via ORAL
  Filled 2012-11-07 (×5): qty 1

## 2012-11-07 MED ORDER — HYDROMORPHONE HCL PF 1 MG/ML IJ SOLN
INTRAMUSCULAR | Status: AC
  Administered 2012-11-07: 0.5 mg via INTRAVENOUS
  Filled 2012-11-07: qty 1

## 2012-11-07 MED ORDER — ALBUMIN HUMAN 5 % IV SOLN
INTRAVENOUS | Status: DC | PRN
  Administered 2012-11-07: 12:00:00 via INTRAVENOUS

## 2012-11-07 MED ORDER — ADULT MULTIVITAMIN W/MINERALS CH: 1.0000 | ORAL_TABLET | Freq: Every day | ORAL | Status: DC

## 2012-11-07 MED ORDER — METHOCARBAMOL 500 MG PO TABS
500.0000 mg | ORAL_TABLET | Freq: Four times a day (QID) | ORAL | Status: DC | PRN
  Administered 2012-11-07 – 2012-11-08 (×2): 500 mg via ORAL
  Filled 2012-11-07 (×2): qty 1

## 2012-11-07 MED ORDER — BUPIVACAINE HCL (PF) 0.25 % IJ SOLN
INTRAMUSCULAR | Status: AC
  Filled 2012-11-07: qty 30

## 2012-11-07 MED ORDER — NADOLOL 20 MG PO TABS
10.0000 mg | ORAL_TABLET | Freq: Every day | ORAL | Status: DC
  Administered 2012-11-07 – 2012-11-08 (×2): 10 mg via ORAL
  Filled 2012-11-07 (×4): qty 1

## 2012-11-07 MED ORDER — BUPIVACAINE-EPINEPHRINE PF 0.25-1:200000 % IJ SOLN
INTRAMUSCULAR | Status: AC
  Filled 2012-11-07: qty 30

## 2012-11-07 MED ORDER — 0.9 % SODIUM CHLORIDE (POUR BTL) OPTIME
TOPICAL | Status: DC | PRN
  Administered 2012-11-07: 1000 mL

## 2012-11-07 MED ORDER — HYDROMORPHONE HCL PF 1 MG/ML IJ SOLN
0.5000 mg | INTRAMUSCULAR | Status: DC | PRN
  Administered 2012-11-07: 1 mg via INTRAVENOUS
  Filled 2012-11-07: qty 1

## 2012-11-07 MED ORDER — ALBUTEROL SULFATE HFA 108 (90 BASE) MCG/ACT IN AERS
INHALATION_SPRAY | RESPIRATORY_TRACT | Status: DC | PRN
  Administered 2012-11-07: 2 via RESPIRATORY_TRACT

## 2012-11-07 MED ORDER — B COMPLEX PO TABS: 1.0000 | ORAL_TABLET | Freq: Every day | ORAL | Status: DC

## 2012-11-07 MED ORDER — FENTANYL CITRATE 0.05 MG/ML IJ SOLN
INTRAMUSCULAR | Status: DC | PRN
  Administered 2012-11-07 (×3): 50 ug via INTRAVENOUS

## 2012-11-07 MED ORDER — ONDANSETRON HCL 4 MG/2ML IJ SOLN
INTRAMUSCULAR | Status: DC | PRN
  Administered 2012-11-07: 4 mg via INTRAVENOUS

## 2012-11-07 MED ORDER — OXYCODONE HCL 5 MG PO TABS: 5.0000 mg | ORAL_TABLET | Freq: Once | ORAL | Status: DC | PRN

## 2012-11-07 MED ORDER — BACLOFEN 10 MG PO TABS
10.0000 mg | ORAL_TABLET | Freq: Three times a day (TID) | ORAL | Status: DC | PRN
  Filled 2012-11-07: qty 1

## 2012-11-07 MED ORDER — KCL IN DEXTROSE-NACL 20-5-0.45 MEQ/L-%-% IV SOLN
INTRAVENOUS | Status: DC
  Administered 2012-11-07: 19:00:00 via INTRAVENOUS
  Filled 2012-11-07 (×2): qty 1000

## 2012-11-07 MED ORDER — OXYCODONE HCL 5 MG PO TABS
5.0000 mg | ORAL_TABLET | ORAL | Status: DC | PRN
  Administered 2012-11-07 (×2): 5 mg via ORAL
  Administered 2012-11-08 (×4): 10 mg via ORAL
  Administered 2012-11-09: 5 mg via ORAL
  Filled 2012-11-07: qty 2
  Filled 2012-11-07 (×2): qty 1
  Filled 2012-11-07: qty 2
  Filled 2012-11-07: qty 1
  Filled 2012-11-07 (×2): qty 2

## 2012-11-07 MED ORDER — SUCCINYLCHOLINE CHLORIDE 20 MG/ML IJ SOLN
INTRAMUSCULAR | Status: DC | PRN
  Administered 2012-11-07: 60 mg via INTRAVENOUS

## 2012-11-07 MED ORDER — PROPOFOL 10 MG/ML IV BOLUS
INTRAVENOUS | Status: DC | PRN
  Administered 2012-11-07: 50 mg via INTRAVENOUS
  Administered 2012-11-07: 120 mg via INTRAVENOUS

## 2012-11-07 MED ORDER — DOCUSATE SODIUM 100 MG PO CAPS
100.0000 mg | ORAL_CAPSULE | Freq: Two times a day (BID) | ORAL | Status: DC
  Administered 2012-11-07 – 2012-11-08 (×3): 100 mg via ORAL
  Filled 2012-11-07 (×6): qty 1

## 2012-11-07 MED ORDER — OXYCODONE HCL 5 MG/5ML PO SOLN: 5.0000 mg | Freq: Once | ORAL | Status: DC | PRN

## 2012-11-07 MED ORDER — MIRTAZAPINE 15 MG PO TABS
15.0000 mg | ORAL_TABLET | Freq: Every day | ORAL | Status: DC
  Administered 2012-11-07 – 2012-11-08 (×2): 15 mg via ORAL
  Filled 2012-11-07 (×3): qty 1

## 2012-11-07 MED ORDER — METHOCARBAMOL 100 MG/ML IJ SOLN
500.0000 mg | Freq: Four times a day (QID) | INTRAVENOUS | Status: DC | PRN
  Filled 2012-11-07: qty 5

## 2012-11-07 MED ORDER — PANTOPRAZOLE SODIUM 40 MG PO TBEC
40.0000 mg | DELAYED_RELEASE_TABLET | Freq: Every day | ORAL | Status: DC
  Administered 2012-11-07 – 2012-11-08 (×2): 40 mg via ORAL
  Filled 2012-11-07 (×2): qty 1

## 2012-11-07 MED ORDER — OXYCODONE HCL 5 MG PO TABS: 5.0000 mg | ORAL_TABLET | ORAL | Status: DC | PRN

## 2012-11-07 MED ORDER — TRAZODONE HCL 50 MG PO TABS
50.0000 mg | ORAL_TABLET | Freq: Every day | ORAL | Status: DC
  Administered 2012-11-07 – 2012-11-08 (×2): 50 mg via ORAL
  Filled 2012-11-07 (×3): qty 1

## 2012-11-07 MED ORDER — HYDROMORPHONE HCL PF 1 MG/ML IJ SOLN
INTRAMUSCULAR | Status: AC
  Filled 2012-11-07: qty 1

## 2012-11-07 MED ORDER — ARTIFICIAL TEARS OP OINT
TOPICAL_OINTMENT | OPHTHALMIC | Status: DC | PRN
  Administered 2012-11-07: 1 via OPHTHALMIC

## 2012-11-07 SURGICAL SUPPLY — 77 items
BANDAGE ELASTIC 3 VELCRO ST LF (GAUZE/BANDAGES/DRESSINGS) ×1 IMPLANT
BANDAGE ELASTIC 4 VELCRO ST LF (GAUZE/BANDAGES/DRESSINGS) ×3 IMPLANT
BANDAGE GAUZE ELAST BULKY 4 IN (GAUZE/BANDAGES/DRESSINGS) ×3 IMPLANT
BIT DRILL 2.2 SS TIBIAL (BIT) ×1 IMPLANT
BLADE SURG ROTATE 9660 (MISCELLANEOUS) IMPLANT
BNDG CMPR 9X4 STRL LF SNTH (GAUZE/BANDAGES/DRESSINGS) ×1
BNDG COHESIVE 4X5 TAN STRL (GAUZE/BANDAGES/DRESSINGS) ×1 IMPLANT
BNDG ESMARK 4X9 LF (GAUZE/BANDAGES/DRESSINGS) ×2 IMPLANT
CLOTH BEACON ORANGE TIMEOUT ST (SAFETY) ×1 IMPLANT
CORDS BIPOLAR (ELECTRODE) ×2 IMPLANT
COVER SURGICAL LIGHT HANDLE (MISCELLANEOUS) ×2 IMPLANT
CUFF TOURNIQUET SINGLE 18IN (TOURNIQUET CUFF) ×2 IMPLANT
CUFF TOURNIQUET SINGLE 24IN (TOURNIQUET CUFF) IMPLANT
DRAIN TLS ROUND 10FR (DRAIN) IMPLANT
DRAPE OEC MINIVIEW 54X84 (DRAPES) ×2 IMPLANT
DRAPE SURG 17X11 SM STRL (DRAPES) ×1 IMPLANT
DRAPE SURG 17X23 STRL (DRAPES) ×1 IMPLANT
DRSG ADAPTIC 3X8 NADH LF (GAUZE/BANDAGES/DRESSINGS) ×2 IMPLANT
DRSG TEGADERM 4X4.75 (GAUZE/BANDAGES/DRESSINGS) ×1 IMPLANT
ELECT REM PT RETURN 9FT ADLT (ELECTROSURGICAL) ×2
ELECTRODE REM PT RTRN 9FT ADLT (ELECTROSURGICAL) IMPLANT
GAUZE SPONGE 4X4 16PLY XRAY LF (GAUZE/BANDAGES/DRESSINGS) ×2 IMPLANT
GLOVE BIOGEL PI IND STRL 8.5 (GLOVE) ×1 IMPLANT
GLOVE BIOGEL PI INDICATOR 8.5 (GLOVE) ×1
GLOVE SURG ORTHO 8.0 STRL STRW (GLOVE) ×2 IMPLANT
GLOVE SURG SS PI 7.0 STRL IVOR (GLOVE) ×1 IMPLANT
GOWN PREVENTION PLUS XLARGE (GOWN DISPOSABLE) ×2 IMPLANT
GOWN STRL NON-REIN LRG LVL3 (GOWN DISPOSABLE) ×3 IMPLANT
GOWN STRL REIN XL XLG (GOWN DISPOSABLE) ×2 IMPLANT
K-WIRE 1.6 (WIRE) ×2
K-WIRE FX5X1.6XNS BN SS (WIRE) ×1
KIT BASIN OR (CUSTOM PROCEDURE TRAY) ×2 IMPLANT
KIT ROOM TURNOVER OR (KITS) ×2 IMPLANT
KWIRE FX5X1.6XNS BN SS (WIRE) IMPLANT
MANIFOLD NEPTUNE II (INSTRUMENTS) ×1 IMPLANT
NDL HYPO 25X1 1.5 SAFETY (NEEDLE) ×1 IMPLANT
NEEDLE HYPO 25X1 1.5 SAFETY (NEEDLE) ×2 IMPLANT
NS IRRIG 1000ML POUR BTL (IV SOLUTION) ×2 IMPLANT
PACK ORTHO EXTREMITY (CUSTOM PROCEDURE TRAY) ×2 IMPLANT
PAD ARMBOARD 7.5X6 YLW CONV (MISCELLANEOUS) ×4 IMPLANT
PAD CAST 4YDX4 CTTN HI CHSV (CAST SUPPLIES) ×1 IMPLANT
PADDING CAST COTTON 4X4 STRL (CAST SUPPLIES) ×2
PEG LOCKING SMOOTH 2.2X18 (Peg) ×1 IMPLANT
PEG LOCKING SMOOTH 2.2X20 (Screw) ×2 IMPLANT
PEG LOCKING SMOOTH 2.2X22 (Screw) ×1 IMPLANT
PLATE NARROW DVR RIGHT (Plate) ×1 IMPLANT
SCREW  LP NL 2.7X15MM (Screw) ×1 IMPLANT
SCREW 2.7X14MM (Screw) ×4 IMPLANT
SCREW BN 14X2.7XNONLOCK 3 LD (Screw) IMPLANT
SCREW LOCK 14X2.7X 3 LD TPR (Screw) IMPLANT
SCREW LOCK 18X2.7X 3 LD TPR (Screw) IMPLANT
SCREW LOCK 20X2.7X 3 LD TPR (Screw) IMPLANT
SCREW LOCKING 2.7X14 (Screw) ×2 IMPLANT
SCREW LOCKING 2.7X15MM (Screw) ×1 IMPLANT
SCREW LOCKING 2.7X18 (Screw) ×2 IMPLANT
SCREW LOCKING 2.7X20MM (Screw) ×2 IMPLANT
SCREW LP NL 2.7X15MM (Screw) IMPLANT
SCREW NLOCK 2.7X14 (Screw) ×4 IMPLANT
SOAP 2 % CHG 4 OZ (WOUND CARE) ×3 IMPLANT
SPLINT FIBERGLASS 3X35 (CAST SUPPLIES) ×1 IMPLANT
SPONGE GAUZE 4X4 12PLY (GAUZE/BANDAGES/DRESSINGS) ×2 IMPLANT
SPONGE LAP 4X18 X RAY DECT (DISPOSABLE) ×2 IMPLANT
STRIP CLOSURE SKIN 1/2X4 (GAUZE/BANDAGES/DRESSINGS) IMPLANT
SUT ETHILON 4 0 PS 2 18 (SUTURE) ×2 IMPLANT
SUT MNCRL AB 4-0 PS2 18 (SUTURE) IMPLANT
SUT PROLENE 4 0 PS 2 18 (SUTURE) ×1 IMPLANT
SUT VIC AB 2-0 FS1 27 (SUTURE) ×2 IMPLANT
SUT VICRYL 4-0 PS2 18IN ABS (SUTURE) ×3 IMPLANT
SYR CONTROL 10ML LL (SYRINGE) ×1 IMPLANT
SYSTEM CHEST DRAIN TLS 7FR (DRAIN) ×1 IMPLANT
TAPE SURG TRANSPORE 1 IN (GAUZE/BANDAGES/DRESSINGS) IMPLANT
TAPE SURGICAL TRANSPORE 1 IN (GAUZE/BANDAGES/DRESSINGS) ×1
TOWEL OR 17X24 6PK STRL BLUE (TOWEL DISPOSABLE) ×2 IMPLANT
TOWEL OR 17X26 10 PK STRL BLUE (TOWEL DISPOSABLE) ×2 IMPLANT
TUBE CONNECTING 12X1/4 (SUCTIONS) ×2 IMPLANT
WATER STERILE IRR 1000ML POUR (IV SOLUTION) ×2 IMPLANT
YANKAUER SUCT BULB TIP NO VENT (SUCTIONS) ×1 IMPLANT

## 2012-11-07 NOTE — Anesthesia Procedure Notes (Addendum)
Procedure Name: LMA Insertion Date/Time: 11/07/2012 11:12 AM Performed by: Erik Obey Pre-anesthesia Checklist: Patient identified, Timeout performed, Emergency Drugs available, Suction available and Patient being monitored Patient Re-evaluated:Patient Re-evaluated prior to inductionOxygen Delivery Method: Circle system utilized Preoxygenation: Pre-oxygenation with 100% oxygen Intubation Type: IV induction LMA: LMA inserted LMA Size: 3.0 Number of attempts: 1 Placement Confirmation: positive ETCO2 and breath sounds checked- equal and bilateral Tube secured with: Tape Dental Injury: Teeth and Oropharynx as per pre-operative assessment    Procedure Name: Intubation Date/Time: 11/07/2012 12:00 PM Performed by: Erik Obey Pre-anesthesia Checklist: Patient identified, Timeout performed, Emergency Drugs available, Suction available and Patient being monitored Patient Re-evaluated:Patient Re-evaluated prior to inductionOxygen Delivery Method: Circle system utilized Preoxygenation: Pre-oxygenation with 100% oxygen Intubation Type: IV induction Ventilation: Mask ventilation without difficulty Laryngoscope Size: Mac and 3 Grade View: Grade II Tube type: Oral Tube size: 7.5 mm Number of attempts: 1 Airway Equipment and Method: Stylet Placement Confirmation: ETT inserted through vocal cords under direct vision,  positive ETCO2 and breath sounds checked- equal and bilateral Secured at: 22 cm Tube secured with: Tape Dental Injury: Teeth and Oropharynx as per pre-operative assessment

## 2012-11-07 NOTE — Transfer of Care (Signed)
Immediate Anesthesia Transfer of Care Note  Patient: [name]  Procedure(s) Performed: Procedure(s): OPEN REDUCTION INTERNAL FIXATION (ORIF) RIGHT DISTAL RADIAL FRACTURE (Right)  Patient Location: PACU  Anesthesia Type:General  Level of Consciousness: awake, alert  and oriented  Airway & Oxygen Therapy: Patient Spontanous Breathing and Patient connected to face mask oxygen  Post-op Assessment: Report given to PACU RN and Post -op Vital signs reviewed and stable  Post vital signs: Reviewed and stable  Complications: No apparent anesthesia complications

## 2012-11-07 NOTE — Brief Op Note (Signed)
11/07/2012  11:04 AM  PATIENT:  [name]  52 y.o. female  PRE-OPERATIVE DIAGNOSIS:  RIGHT DISTAL RADIUS FRACTURE  POST-OPERATIVE DIAGNOSIS:  SAME  PROCEDURE:  Procedure(s): OPEN REDUCTION INTERNAL FIXATION (ORIF) RIGHT DISTAL RADIAL FRACTURE (Right)  SURGEON:  Surgeon(s) and Role:    * [name], MD - Primary  PHYSICIAN ASSISTANT:   ASSISTANTS: none   ANESTHESIA:   general  EBL:     BLOOD ADMINISTERED:none  DRAINS: TLS drain   LOCAL MEDICATIONS USED:  MARCAINE     SPECIMEN:  No Specimen  DISPOSITION OF SPECIMEN:  N/A  COUNTS:  YES  TOURNIQUET:    DICTATION: .typed  PLAN OF CARE: Admit for overnight observation  PATIENT DISPOSITION:  PACU - hemodynamically stable.   Delay start of Pharmacological VTE agent (>24hrs) due to surgical blood loss or risk of bleeding: yes

## 2012-11-07 NOTE — Anesthesia Preprocedure Evaluation (Addendum)
Anesthesia Evaluation  Patient identified by MRN, date of birth, ID band Patient awake    Reviewed: Allergy & Precautions, H&P , NPO status , Patient's Chart, lab work & pertinent test results, reviewed documented beta blocker date and time   History of Anesthesia Complications (+) PONV  Airway Mallampati: I TM Distance: >3 FB Neck ROM: Full    Dental no notable dental hx. (+) Teeth Intact and Dental Advisory Given   Pulmonary neg pulmonary ROS,  breath sounds clear to auscultation  Pulmonary exam normal       Cardiovascular negative cardio ROS  Rhythm:Regular Rate:Normal     Neuro/Psych negative neurological ROS  negative psych ROS   GI/Hepatic negative GI ROS, (+) Cirrhosis -  ascites     , Hepatitis -  Endo/Other  negative endocrine ROS  Renal/GU Renal diseasenegative Renal ROS  negative genitourinary   Musculoskeletal   Abdominal   Peds  Hematology negative hematology ROS (+) anemia ,   Anesthesia Other Findings   Reproductive/Obstetrics negative OB ROS                          Anesthesia Physical Anesthesia Plan  ASA: III  Anesthesia Plan: General   Post-op Pain Management:    Induction: Intravenous  Airway Management Planned: LMA  Additional Equipment:   Intra-op Plan:   Post-operative Plan: Extubation in OR  Informed Consent: I have reviewed the patients History and Physical, chart, labs and discussed the procedure including the risks, benefits and alternatives for the proposed anesthesia with the patient or authorized representative who has indicated his/her understanding and acceptance.   Dental advisory given  Plan Discussed with: CRNA  Anesthesia Plan Comments:         Anesthesia Quick Evaluation

## 2012-11-07 NOTE — Anesthesia Postprocedure Evaluation (Signed)
  Anesthesia Post-op Note  Patient: [name]  Procedure(s) Performed: Procedure(s): OPEN REDUCTION INTERNAL FIXATION (ORIF) RIGHT DISTAL RADIAL FRACTURE (Right)  Patient Location: PACU  Anesthesia Type:General  Level of Consciousness: awake  Airway and Oxygen Therapy: Patient Spontanous Breathing and Patient connected to nasal cannula oxygen  Post-op Pain: moderate  Post-op Assessment: Post-op Vital signs reviewed, Patient's Cardiovascular Status Stable, Respiratory Function Stable, Patent Airway and No signs of Nausea or vomiting  Post-op Vital Signs: Reviewed and stable  Complications: No apparent anesthesia complications

## 2012-11-07 NOTE — H&P (Signed)
[name] is an 52 y.o. female.   Chief Complaint: right distal radius fracture HPI: pt was seen at [facility] pt with displaced distal radius fracture and case canceled on day of surgery because of thrombocytopenia Pt here for surgery on right wrist No prior injury to right wrist  Past Medical History  Diagnosis Date  . Esophageal varices   . Cirrhosis     non alcoholic  . Ascites   . Anemia   . Liver failure   . Portal vein thrombosis   . Kidney stones   . Complication of anesthesia   . PONV (postoperative nausea and vomiting)   . Heart murmur   . Hepatitis     Hx: of Hep "C" it was eradicated    Past Surgical History  Procedure Laterality Date  . Abdominal hysterectomy    . Cholecystectomy    . Upper gastrointestinal endoscopy    . Colonoscopy    . Hernia    . Gastric bypass    . Cesarean section    . Lithotripsy    . Knee arthroscopy      Right knee  . Colonoscopy  10/21/2011    Procedure: COLONOSCOPY;  Surgeon: Rogene Houston, MD;  Location: [facility];  Service: Endoscopy;  Laterality: N/A;  245   . Cystoscopy with stent placement  12/08/2011    Procedure: CYSTOSCOPY WITH STENT PLACEMENT;  Surgeon: Marissa Nestle, MD;  Location: [facility] ORS;  Service: Urology;  Laterality: Right;  . Cystoscopy w/ retrogrades  12/08/2011    Procedure: CYSTOSCOPY WITH RETROGRADE PYELOGRAM;  Surgeon: Marissa Nestle, MD;  Location: [facility];  Service: Urology;  Laterality: Right;  . Hernia repair  04/14/11    Patient states that she hs had 4 total, with most recent  being 04/14/11.  . Dilation and curettage of uterus      Family History  Problem Relation Age of Onset  . Dementia Mother   . Heart disease Father   . Liver disease Father   . Hypertension Father   . Diabetes Father   . Dementia Father   . Hypertension Brother    Social History:  reports that she quit smoking about 21 years ago. Her smoking use included Cigarettes. She smoked 0.00 packs per day. She has never  used smokeless tobacco. She reports that she does not drink alcohol or use illicit drugs.  Allergies:  Allergies  Allergen Reactions  . Ancef [Cefazolin Sodium] Other (See Comments)    Blisters    Medications Prior to Admission  Medication Sig Dispense Refill  . b complex vitamins tablet Take 1 tablet by mouth daily.      . baclofen (LIORESAL) 10 MG tablet Take 10 mg by mouth as needed.      . ciprofloxacin (CIPRO) 500 MG tablet Take 500 mg by mouth daily.      . cyclobenzaprine (FLEXERIL) 10 MG tablet Take 10 mg by mouth 3 (three) times daily as needed for muscle spasms.      . furosemide (LASIX) 40 MG tablet Take 80 mg by mouth daily.       . mirtazapine (REMERON) 15 MG tablet Take 15 mg by mouth at bedtime.      . Multiple Vitamin (MULTIVITAMIN WITH MINERALS) TABS tablet Take 1 tablet by mouth daily.      . nadolol (CORGARD) 20 MG tablet Take 10 mg by mouth every morning.       Marland Kitchen omeprazole (PRILOSEC) 20 MG  capsule Take 20 mg by mouth 3 (three) times daily.       Marland Kitchen oxyCODONE (ROXICODONE) 5 MG immediate release tablet Take 1-2 tablets (5-10 mg total) by mouth every 4 (four) hours as needed for pain.  60 tablet  0  . potassium chloride SA (K-DUR,KLOR-CON) 20 MEQ tablet Take 1 tablet (20 mEq total) by mouth 3 (three) times daily.  6 tablet  0  . rifaximin (XIFAXAN) 550 MG TABS Take 550 mg by mouth 2 (two) times daily.      Marland Kitchen spironolactone (ALDACTONE) 100 MG tablet Take 300 mg by mouth daily.       . traZODone (DESYREL) 50 MG tablet Take 1 tablet (50 mg total) by mouth at bedtime.  30 tablet  5    Results for orders placed during the hospital encounter of 11/07/12 (from the past 48 hour(s))  POCT I-STAT 4, (NA,K, GLUC, HGB,HCT)     Status: Abnormal   Collection Time    11/07/12  9:03 AM      Result Value Range   Sodium 136  135 - 145 mEq/L   Potassium 3.6  3.5 - 5.1 mEq/L   Glucose, Bld 126 (*) 70 - 99 mg/dL   HCT 33.0 (*) 36.0 - 46.0 %   Hemoglobin 11.2 (*) 12.0 - 15.0 g/dL    Dg Chest 2 View  11/07/2012   CLINICAL DATA:  Wrist fracture. Pre operative respiratory EXAM.  EXAM: CHEST  2 VIEW  COMPARISON:  Chest x-rays dated 10/20/2012 and 12/19/2009  FINDINGS: The heart size and mediastinal contours are within normal limits. Both lungs are clear. The visualized skeletal structures are unremarkable.  IMPRESSION: No active cardiopulmonary disease.   Electronically Signed   By: Rozetta Nunnery   On: 11/07/2012 09:23    No recent illnesses or hospitalizations  Blood pressure 108/63, pulse 79, temperature 97.1 F (36.2 C), resp. rate 20, SpO2 99.00%. General Appearance:  Alert, cooperative, no distress, appears stated age  Head:  Normocephalic, without obvious abnormality, atraumatic  Eyes:  Pupils equal, conjunctiva/corneas clear,         Throat: Lips, mucosa, and tongue normal; teeth and gums normal  Neck: No visible masses     Lungs:   respirations unlabored  Chest Wall:  No tenderness or deformity  Heart:  Regular rate and rhythm,  Abdomen:   Soft, non-tender,  distended        Extremities: Right UE: splint intact finger warm well perfused Able to extend thumb  Pulses: 2+ and symmetric  Skin: Skin color, texture, turgor normal, no rashes or lesions     Neurologic: Normal    Assessment/Plan Right comminuted distal radius fracture, intraarticular HEP C ENDSTAGE LIVER DISEASE THROMBOCYTOPENIA   Right distal radius open reduction and internal fixation  R/B/A DISCUSSED WITH PT IN OFFICE.  PT VOICED UNDERSTANDING OF PLAN CONSENT SIGNED DAY OF SURGERY PT SEEN AND EXAMINED PRIOR TO OPERATIVE PROCEDURE/DAY OF SURGERY SITE MARKED. QUESTIONS ANSWERED WILL REMAIN AN INPATIENT FOLLOWING SURGERY  [name] 11/07/2012, 10:48 AM

## 2012-11-07 NOTE — Preoperative (Signed)
Beta Blockers   Reason not to administer Beta Blockers:Not Applicable 

## 2012-11-07 NOTE — Op Note (Signed)
PREOPERATIVE DIAGNOSIS: Right wrist intra-articular distal radius  fracture, 3 or more fragments.   POSTOPERATIVE DIAGNOSIS: Right wrist intra-articular distal radius  fracture, 3 or more fragments.   ATTENDING PHYSICIAN: [name] IV, MD who scrubbed and present  entire procedure.   ASSISTANT SURGEON: None.   ANESTHESIA: GENERAL VIA ET   SURGICAL IMPLANTS: [employer] DVR plate, narrow with six distal locking pegs and 5 bicortical screws proximally  SURGICAL PROCEDURE:  1. Open treatment of rightt wrist intra-articular distal radius fracture, 3 or more fragments.  2. Right wrist brachioradialis tenotomy and release.  3. Radiographs, stress radiographs, right wrist.   SURGICAL INDICATIONS: [name]  is a right-hand-dominant female who sustained an intra-articular distal radius fracture after a fall. The  patient was seen and evaluated in the ED based on degree of  displacement and the  displacement, recommended that she undergo  the above procedure. Risks, benefits, and alternatives were discussed  in detail with the patient. Signed informed consent was obtained.  Risks include, but not limited to bleeding, infection, damage to nearby  nerves, arteries, or tendons, nonunion, malunion, hardware failure, loss  of motion of the elbow, wrist, and digits, and need for further surgical  intervention.   PROCEDURE: The patient was properly identified in the preop holding  area. A mark with a permanent marker was made on the right wrist to  indicate correct operative site.  The patient was then brought back to the  operating room. The patient received preoperative antibiotics. General  anesthesia was induced. Left upper extremity was prepped and draped in  normal sterile fashion. Time-out was called. Correct site was  identified, and procedure then begun. Attention was then turned to the  right wrist. The limb was then elevated using Esmarch exsanguination and  tourniquet  insufflated. A longitudinal incision was made directly over  the FCR sheath. Dissection was then carried down through the skin and  subcutaneous tissue. The FCR sheath was then opened proximally and  distally. Careful dissection was done going through the floor of the  FCR sheath where the FPL was identified. An L-shaped pronator quadratus  flap was then elevated. In order to aid in reduction tenotomy of the brachioradialis was completed with protection of the first dorsal compartment tendons.  The fracture site was then opened and the patient did have several fracture fragment extendin into the joint greater than 3 part intra-articular fracture. Careful open reduction was then carried out. The appropriate size dvr plate was chosen.. The oblong screw hole was then drilled with a 2.2 mm drill bit, then 2.4 mm bicortical screw. Plate height was adjusted.   After position was then confirmed using mini C-arm, the distal row  fixation was then carried out with the beginning from an ulnar to radial  direction with the  locking pegs. The total of 4 locking pegs and 2 screws were then placed. Following this, attention was then turned proximally where 2 more nonlocking screws were then placed in the 2.2 mm drill bit, the 2.4 mm non-locking screws and locking screws.   The wound was then thoroughly irrigated. Final stress radiography was then carried out.  Stress radiographs were then obtained under live fluoro showing no widening of the SL interval. I did not see any carpal dissociation with good fixation, without any  evidence of penetration in the articular margin with the locking pegs.    Postop, the pronator quadratus was then closed with 2-0 Vicryl.  Tourniquet was then deflated.  Hemostasis was then obtained. The  subcutaneous tissues closed with 4-0 Vicryl and skin closed with simple  nylon sutures. Adaptic dressing and sterile compressive bandage was  then applied. The patient was then placed in  a well-padded volar splint. Extubated and taken to recovery room in good condition.    INTRAOPERATIVE RADIOGRAPHS:, 3 views of the wrist do show  the volar plate fixation in place. There is good position in both  planes.    POSTOPERATIVE PLAN: The patient will be admitted for IV antibiotics and  pain control; discharged in the morning. Seen back in the office for  approximately 10-14 days for wound check, suture removal, and then x-  rays, short-arm cast for total 4 weeks, and then begin a therapy regimen  around a 4-week mark. Radiographs at each visit.    Natasha Nakayama, MD

## 2012-11-08 NOTE — Progress Notes (Signed)
PT Cancellation Note  Patient Details Name: [name] MRN: [id number] DOB: 1960-03-24   Cancelled Treatment:    Reason Eval Not Completed: Noted OT eval with pt ambulating with no device and no imbalance.  No PT needs identified, will sign off   Redina Zeller 11/08/2012, 6:00 PM Pager [phone]

## 2012-11-08 NOTE — Progress Notes (Signed)
Spoke with nurse and patient Pt not ready to go home Will continue with drain and iv pain meds/antibiotics Will see in am and likely d/c in am Pt voiced understanding of plan

## 2012-11-08 NOTE — Progress Notes (Signed)
Orthopedic Tech Progress Note Patient Details:  [name] 11-Jul-1960 [id number] Replacement arm sling Ortho Devices Type of Ortho Device: Arm sling Ortho Device/Splint Location: RUE Ortho Device/Splint Interventions: Ordered;Application   Braulio Bosch 11/08/2012, 5:16 PM

## 2012-11-08 NOTE — Evaluation (Signed)
Occupational Therapy Evaluation and Discharge Patient Details Name: [name] MRN: [id number] DOB: [birth year] Today's Date: 11/08/2012 Time: [id number] OT Time Calculation (min): 26 min  OT Assessment / Plan / Recommendation History of present illness right displaced distal radius fracture    Clinical Impression   This 52 yo female presents to acute OT with above, all education completed, acute OT will sign off. No PT needs noted, pt ambulating without AD without issues--only needs S due to IV pole and pain meds making her groggy.    OT Assessment   (progress rehab of RUE per MD)    Follow Up Recommendations  No OT follow up       Equipment Recommendations  None recommended by OT          Precautions / Restrictions Precautions Precautions: None Restrictions Weight Bearing Restrictions: Yes RUE Weight Bearing: Non weight bearing   Pertinent Vitals/Pain 5/10 RUE; repositioned and pre-medicated.    ADL  Transfers/Ambulation Related to ADLs: S for all without AD ADL Comments: Pt has A prn        Visit Information  Last OT Received On: 11/08/12 Assistance Needed: +1 History of Present Illness: right displaced distal radius fracture        Prior [place] expects to be discharged to:: Private residence Living Arrangements: Alone Available Help at Discharge: Family;Available PRN/intermittently Type of Home: House Home Access: Stairs to enter [employer] of Steps: 2 Entrance Stairs-Rails: None Home Layout: One level Home Equipment: None Prior Function Level of Independence: Needs assistance (since she broke her arm on Sept 13th) Gait / Transfers Assistance Needed: Independent ADL's / Homemaking Assistance Needed: Min A Communication / Swallowing Assistance Needed: None Communication Communication: No difficulties Dominant Hand: Right         Vision/Perception Vision - History Patient Visual Report: No  change from baseline   Cognition  Cognition Arousal/Alertness: Awake/alert Behavior During Therapy: WFL for tasks assessed/performed Overall Cognitive Status: Within Functional Limits for tasks assessed    Extremity/Trunk Assessment Upper Extremity Assessment Upper Extremity Assessment: RUE deficits/detail RUE Deficits / Details: Limited by cast/wrapping from surgery on radius in wrist, forearm, and elbow.  Limited in digits due to edema and pain; WFL at shoulder RUE Sensation: decreased light touch RUE Coordination: decreased fine motor;decreased gross motor     Mobility Bed Mobility Bed Mobility: Supine to Sit;Sitting - Scoot to Edge of Bed;Sit to Supine Supine to Sit: 7: Independent Sitting - Scoot to Edge of Bed: 7: Independent Sit to Supine: 7: Independent Transfers Transfers: Sit to Stand;Stand to Sit Sit to Stand: 5: Supervision;From bed Stand to Sit: 5: Supervision;To toilet     Exercise Other Exercises Other Exercises: Educated pt and ex-husband (who A her at home) in propping for edema control, AROM for digits (flexion and extension) and AROM of shoulder (flexion and extension) 10 reps 5x/day at least      End of Session OT - End of Session Equipment Utilized During Treatment:  (pushing IV pole 1/2 distance around unit, then without IV po) Activity Tolerance: Patient tolerated treatment well Patient left: in bed;with call bell/phone within reach;with family/visitor present Nurse Communication:  (Needs a XL sling)   Functional Assessment Tool Used: Clinical Observation Functional Limitation: Self care Self Care Current Status [id number]): At least 1 percent but less than 20 percent impaired, limited or restricted Self Care Goal Status [id number]): At least 1 percent but less than 20 percent impaired, limited or restricted  Self Care Discharge Status [phone]): At least 1 percent but less than 20 percent impaired, limited or restricted   Almon Register  [id number] 11/08/2012, 1:00 PM

## 2012-11-08 NOTE — Progress Notes (Signed)
When pt is supine, her O2 sats are 90-92% on 3L of oxygen. When upright she states she  has less pressure from her ascites and her sats are 96% on 3L. Sats drop into the mid to low 80% without oxygen.

## 2012-11-09 ENCOUNTER — Encounter (HOSPITAL_COMMUNITY): Payer: Self-pay | Admitting: Orthopedic Surgery

## 2012-11-09 NOTE — Discharge Summary (Signed)
Physician Discharge Summary  Patient ID: [name] MRN: [id number] DOB/AGE: 51-Oct-1962 51 y.o.  Admit date: 11/07/2012 Discharge date: 11/09/2012  Admission Diagnoses: RIGHT DISTAL RADIUS FRACTURE Past Medical History  Diagnosis Date  . Esophageal varices   . Cirrhosis     non alcoholic  . Ascites   . Anemia   . Liver failure     "I'm on liver transplant list @ [facility]" (11/07/2012)  . Portal vein thrombosis   . Complication of anesthesia   . PONV (postoperative nausea and vomiting)   . Heart murmur   . Type II diabetes mellitus     "not since gastric bypass" (11/07/2012)  . Hepatitis C     Hx: of Hep "C" it was eradicated  . [id number]     "monthly" (11/07/2012)  . Anxiety   . Depression   . Kidney stones     Discharge Diagnoses:  Right distal radius fracture Hepatitis C Thrombocytopenia Liver failure, end stage  Surgeries: Procedure(s): OPEN REDUCTION INTERNAL FIXATION (ORIF) RIGHT DISTAL RADIAL FRACTURE on 11/07/2012    Consultants:  none  Discharged Condition: Improved  Hospital Course: [name] is an 52 y.o. female who was admitted 11/07/2012 with a chief complaint of No chief complaint on file. , and found to have a diagnosis of RIGHT DISTAL RADIUS FRACTURE.  They were brought to the operating room on 11/07/2012 and underwent Procedure(s): OPEN REDUCTION INTERNAL FIXATION (ORIF) RIGHT DISTAL RADIAL FRACTURE.    They were given perioperative antibiotics: Anti-infectives   Start     Dose/Rate Route Frequency Ordered Stop   11/07/12 2300  vancomycin (VANCOCIN) IVPB 1000 mg/200 mL premix     1,000 mg 200 mL/hr over 60 Minutes Intravenous  Once 11/07/12 1702 11/08/12 0001   11/07/12 1800  rifaximin (XIFAXAN) tablet 550 mg     550 mg Oral 2 times daily 11/07/12 1103     11/07/12 0600  vancomycin (VANCOCIN) IVPB 1000 mg/200 mL premix     1,000 mg 200 mL/hr over 60 Minutes Intravenous On call to O.R. 11/06/12 1410 11/07/12 1100    .  They were given  sequential compression devices, early ambulation, and Other (comment) pt is at high bleeding risk for DVT prophylaxis.  Recent vital signs: Patient Vitals for the past 24 hrs:  BP Temp Temp src Pulse Resp SpO2  11/09/12 0514 105/58 mmHg 99 F (37.2 C) Oral 77 18 96 %  11/08/12 2020 103/57 mmHg 99.2 F (37.3 C) Oral 84 18 96 %  11/08/12 1146 118/60 mmHg 98.8 F (37.1 C) Oral 73 18 91 %  .  Recent laboratory studies: Dg Chest 2 View  11/07/2012   CLINICAL DATA:  Wrist fracture. Pre operative respiratory EXAM.  EXAM: CHEST  2 VIEW  COMPARISON:  Chest x-rays dated 10/20/2012 and 12/19/2009  FINDINGS: The heart size and mediastinal contours are within normal limits. Both lungs are clear. The visualized skeletal structures are unremarkable.  IMPRESSION: No active cardiopulmonary disease.   Electronically Signed   By: Rozetta Nunnery   On: 11/07/2012 09:23    Discharge Medications:     Medication List    TAKE these medications       cyclobenzaprine 10 MG tablet  Commonly known as:  FLEXERIL  Take 10 mg by mouth 3 (three) times daily as needed for muscle spasms.     docusate sodium 100 MG capsule  Commonly known as:  COLACE  Take 1 capsule (100 mg total) by mouth 2 (two) times  daily.     oxyCODONE 5 MG immediate release tablet  Commonly known as:  ROXICODONE  Take 1-2 tablets (5-10 mg total) by mouth every 4 (four) hours as needed for pain.     Oxycodone HCl 10 MG Tabs  Take 1 tablet (10 mg total) by mouth 4 (four) times daily as needed.     potassium chloride SA 20 MEQ tablet  Commonly known as:  K-DUR,KLOR-CON  Take 1 tablet (20 mEq total) by mouth 3 (three) times daily.     spironolactone 100 MG tablet  Commonly known as:  ALDACTONE  Take 300 mg by mouth daily.     traZODone 50 MG tablet  Commonly known as:  DESYREL  Take 1 tablet (50 mg total) by mouth at bedtime.     vitamin C 500 MG tablet  Commonly known as:  ASCORBIC ACID  Take 1 tablet (500 mg total) by mouth daily.      XIFAXAN 550 MG Tabs tablet  Generic drug:  rifaximin  Take 550 mg by mouth 2 (two) times daily.      ASK your doctor about these medications       b complex vitamins tablet  Take 1 tablet by mouth daily.     baclofen 10 MG tablet  Commonly known as:  LIORESAL  Take 10 mg by mouth as needed.     ciprofloxacin 500 MG tablet  Commonly known as:  CIPRO  Take 500 mg by mouth daily.     furosemide 40 MG tablet  Commonly known as:  LASIX  Take 80 mg by mouth daily.     mirtazapine 15 MG tablet  Commonly known as:  REMERON  Take 15 mg by mouth at bedtime.     multivitamin with minerals Tabs tablet  Take 1 tablet by mouth daily.     nadolol 20 MG tablet  Commonly known as:  CORGARD  Take 10 mg by mouth every morning.     omeprazole 20 MG capsule  Commonly known as:  PRILOSEC  Take 20 mg by mouth 3 (three) times daily.        Diagnostic Studies: Dg Chest 2 View  11/07/2012   CLINICAL DATA:  Wrist fracture. Pre operative respiratory EXAM.  EXAM: CHEST  2 VIEW  COMPARISON:  Chest x-rays dated 10/20/2012 and 12/19/2009  FINDINGS: The heart size and mediastinal contours are within normal limits. Both lungs are clear. The visualized skeletal structures are unremarkable.  IMPRESSION: No active cardiopulmonary disease.   Electronically Signed   By: Rozetta Nunnery   On: 11/07/2012 09:23   Dg Forearm Right  10/20/2012   CLINICAL DATA:  Distal wrist pain. Fall.  EXAM: RIGHT FOREARM - 2 VIEW  COMPARISON:  None.  FINDINGS: There is a comminuted intra-articular distal left radial fracture as seen on the wrist series. Mild impaction and angulation. Ulnar styloid fracture seen on the wrist series is difficult to visualize. No additional forearm abnormality.  IMPRESSION: Distal right radial and ulnar styloid fractures, better seen and characterized on wrist series.   Electronically Signed   By: Rolm Baptise M.D.   On: 10/20/2012 19:52   Dg Wrist 2 Views Right  10/20/2012   *RADIOLOGY REPORT*   Clinical Data: Right wrist fracture  RIGHT WRIST - 2 VIEW  Comparison: Prior radiograph performed earlier on the same day  Findings: Previously identified impacted and displaced comminuted fracture of the distal radial metaphysis again seen.  Alignment about the fracture is improved status  post reduction.  There remains volar angulation with impaction.  Ulnar styloid fracture is unchanged.  Diffuse soft tissue swelling is present about the wrist.  IMPRESSION: Improved alignment status post reduction of comminuted impacted distal radial fracture.  Unchanged ulnar styloid fracture.   Original Report Authenticated By: Jeannine Boga, M.D.   Dg Wrist Complete Right  10/20/2012   CLINICAL DATA:  Post fall, wrist pain  EXAM: RIGHT WRIST - COMPLETE 3+ VIEW  COMPARISON:  None.  FINDINGS: Three views of the right wrist submitted. There is impacted mild displaced comminuted fracture of distal right radial metaphysis. Displaced fracture of the ulnar styloid.  IMPRESSION: There is impacted mild displaced comminuted fracture of distal right radial metaphysis. Displaced fracture of ulnar styloid.   Electronically Signed   By: Lahoma Crocker   On: 10/20/2012 19:52   Dg Chest Port 1 View  10/20/2012   CLINICAL DATA:  Fall.  EXAM: PORTABLE CHEST - 1 VIEW  COMPARISON:  12/06/2011  FINDINGS: The heart size and mediastinal contours are within normal limits. Both lungs are clear. The visualized skeletal structures are unremarkable.  IMPRESSION: No active disease.   Electronically Signed   By: Rolm Baptise M.D.   On: 10/20/2012 21:49   Dg Foot Complete Right  10/20/2012   CLINICAL DATA:  Fall, medial foot pain, swelling.  EXAM: RIGHT FOOT COMPLETE - 3+ VIEW  COMPARISON:  None.  FINDINGS: There is an oblique fracture through the right 5th metatarsal. Minimal displacement. No additional acute bony abnormality.  IMPRESSION: Oblique right 5th metatarsal fracture.   Electronically Signed   By: Rolm Baptise M.D.   On: 10/20/2012  21:51    They benefited maximally from their hospital stay and there were no complications.     Disposition: 01-Home or Self Care      Future Appointments Provider Department Dept Phone   12/24/2012 3:30 PM Rogene Houston, MD [facility] [phone]      PT SEEN EXAMINED ON DAY OF DISCHARGE, READY TO GO HOME F/U WITH ME IN OFFICE IN 14 DAYS   Signed: [name] 11/09/2012, 6:47 AM

## 2012-12-13 ENCOUNTER — Other Ambulatory Visit: Payer: Self-pay

## 2012-12-24 ENCOUNTER — Ambulatory Visit (INDEPENDENT_AMBULATORY_CARE_PROVIDER_SITE_OTHER): Payer: BC Managed Care – PPO | Admitting: Internal Medicine

## 2012-12-24 ENCOUNTER — Encounter (INDEPENDENT_AMBULATORY_CARE_PROVIDER_SITE_OTHER): Payer: Self-pay | Admitting: Internal Medicine

## 2012-12-24 VITALS — BP 102/70 | HR 74 | Temp 98.1°F | Resp 18 | Ht 66.0 in | Wt 154.7 lb

## 2012-12-24 DIAGNOSIS — K219 Gastro-esophageal reflux disease without esophagitis: Secondary | ICD-10-CM

## 2012-12-24 DIAGNOSIS — R188 Other ascites: Secondary | ICD-10-CM

## 2012-12-24 DIAGNOSIS — K7460 Unspecified cirrhosis of liver: Secondary | ICD-10-CM

## 2012-12-24 MED ORDER — OMEPRAZOLE 20 MG PO CPDR: 20.0000 mg | DELAYED_RELEASE_CAPSULE | Freq: Two times a day (BID) | ORAL | Status: DC

## 2012-12-24 NOTE — Patient Instructions (Addendum)
Notify when abdomen becomes tense. Will arrange consultation with Dr. Monica Martinez of [facility]

## 2012-12-24 NOTE — Progress Notes (Signed)
Presenting complaint;  Followup for chronic liver disease.  Subjective:  [name] is 52 year old Caucasian female who has cirrhosis secondary to chronic hepatitis C treated over 8 years ago with SVR. She now presents with abdominal swelling. She had abdominal tap on 10/13/2012 at [facility] with removal of 3.52 L of fluid. She believes she will be needing a tap in the next few weeks. She denies abdominal pain fever or chills. She had open reduction for fracture 2 right distal radius on 11/07/2012 and her transplant status was deactivated. 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 Her meld score was 10. Patient's next appointment at [facility] is in generally 2015. Patient states she has gotten tired of making long trips to [facility]. She would like for her care to be transferred to [facility]. She continues to complain of feeling tired and fatigued. She is not able to sleep unless she takes mirtazapine and trazodone. She is taking oxycodone occasionally for this pain. She is getting physical therapy.    Current Medications: Current Outpatient Prescriptions  Medication Sig Dispense Refill  . b complex vitamins tablet Take 1 tablet by mouth daily.      . baclofen (LIORESAL) 10 MG tablet Take 10 mg by mouth as needed.      . docusate sodium (COLACE) 100 MG capsule Take 1 capsule (100 mg total) by mouth 2 (two) times daily.  10 capsule  0  . furosemide (LASIX) 40 MG tablet Take 80 mg by mouth daily.       . mirtazapine (REMERON) 15 MG tablet Take 15 mg by mouth at bedtime.      . Multiple Vitamin (MULTIVITAMIN WITH MINERALS) TABS tablet Take 1 tablet by mouth daily.      . nadolol (CORGARD) 20 MG tablet Take 10 mg by mouth every morning.       Marland Kitchen omeprazole (PRILOSEC) 20 MG capsule Take 20 mg by mouth 3 (three) times daily.       Marland Kitchen oxyCODONE (ROXICODONE) 5 MG immediate release tablet Take 1-2 tablets (5-10 mg total) by mouth every 4  (four) hours as needed for pain.  60 tablet  0  . rifaximin (XIFAXAN) 550 MG TABS Take 550 mg by mouth 2 (two) times daily.      Marland Kitchen spironolactone (ALDACTONE) 100 MG tablet Take 300 mg by mouth daily.       . traZODone (DESYREL) 50 MG tablet Take 1 tablet (50 mg total) by mouth at bedtime.  30 tablet  5   No current facility-administered medications for this visit.     Objective: Blood pressure 102/70, pulse 74, temperature 98.1 F (36.7 C), temperature source Oral, resp. rate 18, height 5\' 6"  (1.676 m), weight 154 lb 11.2 oz (70.171 kg).  patient is alert and does not have asterixis. Conjunctiva is pink. Sclera is nonicteric Oropharyngeal mucosa is normal. No neck masses or thyromegaly noted. Cardiac exam with regular rhythm normal S1 and [id number] has faint systolic ejection murmur best heard at aortic area.  Lungs are clear to auscultation. Abdomen is full. Spleen is enlarged and mildly tender on palpation but no rub noted. Liver edge is firm below RCM. Shifting dullness is present. No LE edema or clubbing noted.    Assessment:  #1. Decompensated chronic liver disease secondary to successfully treated chronic hepatitis C. She has undergone esophageal variceal banding for primary prophylaxis at [facility]. She is requiring periodic abdominal taps for ascites.  She had tap locally in June in 2 months ago at [facility]. She's been getting screening tests for HCC via [facility]. Her ascites is reaccumulating but she is not quite ready for another abdominal tap. We discussed about discontinuing trazodone and or mirtazapine him. She states she is not able to sleep without these medications. Hepatic encephalopathy is well controlled with therapy. #2. GERD.    Plan: Patient will call when she needs abdominal paracentesis.  Will make an appointment with Dr. Monica Martinez of [facility]. Patient will bring his copy of her recent blood work from [facility]. Decrease omeprazole to 20 mg by mouth twice a day. Office visit  in 3 months.

## 2013-01-17 ENCOUNTER — Other Ambulatory Visit (INDEPENDENT_AMBULATORY_CARE_PROVIDER_SITE_OTHER): Payer: Self-pay | Admitting: Internal Medicine

## 2013-01-17 DIAGNOSIS — K7460 Unspecified cirrhosis of liver: Secondary | ICD-10-CM

## 2013-01-17 DIAGNOSIS — R188 Other ascites: Secondary | ICD-10-CM

## 2013-01-18 ENCOUNTER — Encounter (HOSPITAL_COMMUNITY): Payer: Self-pay

## 2013-01-18 ENCOUNTER — Ambulatory Visit (HOSPITAL_COMMUNITY)
Admission: RE | Admit: 2013-01-18 | Discharge: 2013-01-18 | Disposition: A | Discharge status: Home / Self Care | Payer: BC Managed Care – PPO | Source: Ambulatory Visit | Attending: Internal Medicine | Admitting: Internal Medicine

## 2013-01-18 DIAGNOSIS — K7460 Unspecified cirrhosis of liver: Secondary | ICD-10-CM

## 2013-01-18 DIAGNOSIS — R188 Other ascites: Secondary | ICD-10-CM | POA: Insufficient documentation

## 2013-01-18 LAB — BODY FLUID CELL COUNT WITH DIFFERENTIAL
Eos, Fluid: 0 %
Lymphs, Fluid: 25 %
Monocyte-Macrophage-Serous Fluid: 69 % (ref 50–90)

## 2013-01-18 MED ORDER — ALBUMIN HUMAN 25 % IV SOLN
50.0000 g | Freq: Once | INTRAVENOUS | Status: AC
  Administered 2013-01-18: 50 g via INTRAVENOUS

## 2013-01-18 MED ORDER — ALBUMIN HUMAN 25 % IV SOLN
INTRAVENOUS | Status: AC
  Administered 2013-01-18: 50 g via INTRAVENOUS
  Filled 2013-01-18: qty 200

## 2013-01-18 NOTE — Progress Notes (Signed)
Paracentesis complete no signs of distress. 2200 ml amber  colored abdominal fluid removed.

## 2013-01-22 LAB — BODY FLUID CULTURE

## 2013-01-23 LAB — ANAEROBIC CULTURE: Gram Stain: NONE SEEN

## 2013-03-25 ENCOUNTER — Other Ambulatory Visit: Payer: Self-pay | Admitting: Obstetrics & Gynecology

## 2013-03-26 ENCOUNTER — Ambulatory Visit (INDEPENDENT_AMBULATORY_CARE_PROVIDER_SITE_OTHER): Payer: BC Managed Care – PPO | Admitting: Internal Medicine

## 2013-04-01 ENCOUNTER — Other Ambulatory Visit (HOSPITAL_COMMUNITY)
Admission: RE | Admit: 2013-04-01 | Discharge: 2013-04-01 | Disposition: A | Discharge status: Home / Self Care | Payer: BC Managed Care – PPO | Source: Ambulatory Visit | Attending: Obstetrics & Gynecology | Admitting: Obstetrics & Gynecology

## 2013-04-01 ENCOUNTER — Ambulatory Visit (INDEPENDENT_AMBULATORY_CARE_PROVIDER_SITE_OTHER): Payer: BC Managed Care – PPO | Admitting: Obstetrics & Gynecology

## 2013-04-01 ENCOUNTER — Encounter: Payer: Self-pay | Admitting: Obstetrics & Gynecology

## 2013-04-01 VITALS — BP 102/60 | Ht 66.0 in | Wt 154.0 lb

## 2013-04-01 DIAGNOSIS — Z01419 Encounter for gynecological examination (general) (routine) without abnormal findings: Secondary | ICD-10-CM

## 2013-04-01 DIAGNOSIS — Z1151 Encounter for screening for human papillomavirus (HPV): Secondary | ICD-10-CM | POA: Insufficient documentation

## 2013-04-01 NOTE — Progress Notes (Signed)
Patient ID: [name], female   DOB: 1960-08-03, 53 y.o.   MRN: [id number] Subjective:     [name] is a 53 y.o. female here for a routine exam.  No LMP recorded. Patient has had a hysterectomy. No obstetric history on file. Birth Control Method:  hyst Menstrual Calendar(currently): hyst  Current complaints: none.   Current acute medical issues:  Chronic liver failure   Recent Gynecologic History No LMP recorded. Patient has had a hysterectomy. Last Pap: 2003,  normal Last mammogram: 2014,  normal  Past Medical History  Diagnosis Date  . Esophageal varices   . Cirrhosis     non alcoholic  . Ascites   . Anemia   . Liver failure     "I'm on liver transplant list @ [facility]" (11/07/2012)  . Portal vein thrombosis   . Complication of anesthesia   . PONV (postoperative nausea and vomiting)   . Heart murmur   . Type II diabetes mellitus     "not since gastric bypass" (11/07/2012)  . Hepatitis C     Hx: of Hep "C" it was eradicated  . [id number])     "monthly" (11/07/2012)  . Anxiety   . Depression   . Kidney stones     Past Surgical History  Procedure Laterality Date  . Abdominal hysterectomy  2003  . Upper gastrointestinal endoscopy    . Colonoscopy    . Gastric bypass  ~ 1995  . Cesarean section  1995  . Lithotripsy      "several times" (11/07/2012)  . Knee arthroscopy Right   . Colonoscopy  10/21/2011    Procedure: COLONOSCOPY;  Surgeon: Rogene Houston, MD;  Location: [facility];  Service: Endoscopy;  Laterality: N/A;  245   . Cystoscopy with stent placement  12/08/2011    Procedure: CYSTOSCOPY WITH STENT PLACEMENT;  Surgeon: Marissa Nestle, MD;  Location: [facility] ORS;  Service: Urology;  Laterality: Right;  . Cystoscopy w/ retrogrades  12/08/2011    Procedure: CYSTOSCOPY WITH RETROGRADE PYELOGRAM;  Surgeon: Marissa Nestle, MD;  Location: [facility] ORS;  Service: Urology;  Laterality: Right;  . Dilation and curettage of uterus    . Orif distal radius fracture Right  11/07/2012  . Cholecystectomy  1987  . Hernia repair  04/14/11    "one in my bellybutton" (11/07/2012)  . Inguinal hernia repair Right     "maybe 2" (11/07/2012)  . Open reduction internal fixation (orif) distal radial fracture Right 11/07/2012    Procedure: OPEN REDUCTION INTERNAL FIXATION (ORIF) RIGHT DISTAL RADIAL FRACTURE;  Surgeon: [name], MD;  Location: [place];  Service: Orthopedics;  Laterality: Right;    OB History   Grav Para Term Preterm Abortions TAB SAB Ect Mult Living                  History   Social History  . Marital Status: Divorced    Spouse Name: N/A    Number of Children: N/A  . Years of Education: N/A   Social History Main Topics  . Smoking status: Former Smoker -- 0.50 packs/day for 5 years    Types: Cigarettes    Quit date: 05/02/1991  . Smokeless tobacco: Never Used  . Alcohol Use: No  . Drug Use: No  . Sexual Activity: Not Currently   Other Topics Concern  . None   Social History Narrative  . None    Family History  Problem Relation Age of Onset  .  Dementia Mother   . Heart disease Father   . Liver disease Father   . Hypertension Father   . Diabetes Father   . Dementia Father   . Hypertension Brother      Review of Systems  Review of Systems  Constitutional: Negative for fever, chills, weight loss, malaise/fatigue and diaphoresis.  HENT: Negative for hearing loss, ear pain, nosebleeds, congestion, sore throat, neck pain, tinnitus and ear discharge.   Eyes: Negative for blurred vision, double vision, photophobia, pain, discharge and redness.  Respiratory: Negative for cough, hemoptysis, sputum production, shortness of breath, wheezing and stridor.   Cardiovascular: Negative for chest pain, palpitations, orthopnea, claudication, leg swelling and PND.  Gastrointestinal: negative for abdominal pain. Negative for heartburn, nausea, vomiting, diarrhea, constipation, blood in stool and melena.  Genitourinary: Negative for dysuria,  urgency, frequency, hematuria and flank pain.  Musculoskeletal: Negative for myalgias, back pain, joint pain and falls.  Skin: Negative for itching and rash.  Neurological: Negative for dizziness, tingling, tremors, sensory change, speech change, focal weakness, seizures, loss of consciousness, weakness and headaches.  Endo/Heme/Allergies: Negative for environmental allergies and polydipsia. Does not bruise/bleed easily.  Psychiatric/Behavioral: Negative for depression, suicidal ideas, hallucinations, memory loss and substance abuse. The patient is not nervous/anxious and does not have insomnia.        Objective:    Physical Exam  Vitals reviewed. Constitutional: She is oriented to person, place, and time. She appears well-developed and well-nourished.  HENT:  Head: Normocephalic and atraumatic.        Right Ear: External ear normal.  Left Ear: External ear normal.  Nose: Nose normal.  Mouth/Throat: Oropharynx is clear and moist.  Eyes: Conjunctivae and EOM are normal. Pupils are equal, round, and reactive to light. Right eye exhibits no discharge. Left eye exhibits no discharge. No scleral icterus.  Neck: Normal range of motion. Neck supple. No tracheal deviation present. No thyromegaly present.  Cardiovascular: Normal rate, regular rhythm, normal heart sounds and intact distal pulses.  Exam reveals no gallop and no friction rub.   No murmur heard. Respiratory: Effort normal and breath sounds normal. No respiratory distress. She has no wheezes. She has no rales. She exhibits no tenderness.  GI: Soft. Bowel sounds are normal. She exhibits no distension and no mass. There is no tenderness. There is no rebound and no guarding.  Genitourinary:  Breasts no masses skin changes or nipple changes bilaterally      Vulva is normal without lesions Vagina is pink moist without discharge Cervix absent Uterus is absrent Adnexa is negative    Musculoskeletal: Normal range of motion. She exhibits  no edema and no tenderness.  Neurological: She is alert and oriented to person, place, and time. She has normal reflexes. She displays normal reflexes. No cranial nerve deficit. She exhibits normal muscle tone. Coordination normal.  Skin: Skin is warm and dry. No rash noted. No erythema. No pallor.  Psychiatric: She has a normal mood and affect. Her behavior is normal. Judgment and thought content normal.       Assessment:    Healthy female exam.    Plan:    Follow up in: 1 year.

## 2013-04-01 NOTE — Addendum Note (Signed)
Addended by: Linton Rump on: 04/01/2013 12:16 PM   Modules accepted: Orders

## 2013-04-17 ENCOUNTER — Encounter (INDEPENDENT_AMBULATORY_CARE_PROVIDER_SITE_OTHER): Payer: Self-pay | Admitting: Internal Medicine

## 2013-04-17 ENCOUNTER — Ambulatory Visit (INDEPENDENT_AMBULATORY_CARE_PROVIDER_SITE_OTHER): Payer: BC Managed Care – PPO | Admitting: Internal Medicine

## 2013-04-17 VITALS — BP 110/68 | HR 70 | Temp 98.4°F | Resp 18 | Ht 66.0 in | Wt 149.6 lb

## 2013-04-17 DIAGNOSIS — R5381 Other malaise: Secondary | ICD-10-CM

## 2013-04-17 DIAGNOSIS — R5383 Other fatigue: Secondary | ICD-10-CM

## 2013-04-17 DIAGNOSIS — R188 Other ascites: Secondary | ICD-10-CM

## 2013-04-17 DIAGNOSIS — K7460 Unspecified cirrhosis of liver: Secondary | ICD-10-CM

## 2013-04-17 NOTE — Patient Instructions (Signed)
Notify if you weight drops by more than 5 pounds. Will request copy of her recent blood work from [facility]

## 2013-04-17 NOTE — Progress Notes (Signed)
Presenting complaint;  Followup for decompensated cirrhosis.  Subjective:  Patient is 53 year old Caucasian female who has decompensated liver disease. She has history of hep C cirrhosis which was successfully treated 10 years ago. She has been evaluated for transplant at [facility] but most recently seen by Dr. Monica Martinez of [facility] and is undergoing evaluation for liver transplant. Her main complaint is extreme fatigue. She says it takes her long time to get ready if she has to come for appointment to go to [facility]. Her appetite is fair. Her weight is down to 5 pounds since her last visit 4 months ago. She has intermittent left upper quadrant abdominal pain but it's mild and does not occur often. She denies nausea vomiting melena or rectal bleeding. Her bowels move daily. She had multiple studies at [facility] including blood work 8 weeks ago. Her transplant coordinator is Ms. Nicanor Bake.         Current Medications: Current Outpatient Prescriptions  Medication Sig Dispense Refill  . b complex vitamins tablet Take 1 tablet by mouth daily.      . baclofen (LIORESAL) 10 MG tablet Take 10 mg by mouth as needed.      . cyclobenzaprine (FLEXERIL) 10 MG tablet Take 10 mg by mouth at bedtime.       . docusate sodium (COLACE) 100 MG capsule Take 1 capsule (100 mg total) by mouth 2 (two) times daily.  10 capsule  0  . DULoxetine (CYMBALTA) 20 MG capsule Take 20 mg by mouth daily.       . ferrous sulfate 325 (65 FE) MG tablet Take 325 mg by mouth 2 (two) times daily with a meal.      . furosemide (LASIX) 40 MG tablet Take 80 mg by mouth daily.       . mirtazapine (REMERON) 15 MG tablet Take 15 mg by mouth at bedtime.      . Multiple Vitamin (MULTIVITAMIN WITH MINERALS) TABS tablet Take 1 tablet by mouth daily.      . nadolol (CORGARD) 20 MG tablet Take 10 mg by mouth every morning.       Marland Kitchen omeprazole (PRILOSEC) 20 MG capsule Take 1 capsule (20 mg total) by mouth 2 (two) times  daily before a meal.  60 capsule  5  . rifaximin (XIFAXAN) 550 MG TABS Take 550 mg by mouth 2 (two) times daily.      Marland Kitchen spironolactone (ALDACTONE) 100 MG tablet Take 300 mg by mouth daily.       . traZODone (DESYREL) 50 MG tablet Take 1 tablet (50 mg total) by mouth at bedtime.  30 tablet  5   No current facility-administered medications for this visit.     Objective: Blood pressure 110/68, pulse 70, temperature 98.4 F (36.9 C), temperature source Oral, resp. rate 18, height 5\' 6"  (1.676 m), weight 149 lb 9.6 oz (67.858 kg). Patient is alert and does not have asterixis. Conjunctiva is pink. Sclera is nonicteric Oropharyngeal mucosa is normal. No neck masses or thyromegaly noted. Cardiac exam with regular rhythm normal S1 and S2. No murmur or gallop noted. Lungs are clear to auscultation. Abdomen is full. Abdomen is soft with large spleen which is mildly tender but no bruit or rub noted. Liver edge is indistinct. Flanks are dull.  Extremities are thin with muscle wasting but no LE edema or clubbing noted.   Assessment:  #1. Advanced cirrhosis complicated by ascites and esophageal varices for which she has undergone  banding at [facility]. Now she is under care of Dr. Monica Martinez of [facility]. She is up-to-date on screening for [place]. Incapacitating weakness primarily secondary to liver disease with some contribution from her medications.   Plan:  Will request copy of recent blood work that she had [facility]. Patient will call if she loses another 5 pounds. Office visit in 4 months.

## 2013-05-27 DIAGNOSIS — F411 Generalized anxiety disorder: Secondary | ICD-10-CM | POA: Diagnosis present

## 2013-05-27 DIAGNOSIS — R188 Other ascites: Secondary | ICD-10-CM | POA: Diagnosis present

## 2013-05-27 DIAGNOSIS — D696 Thrombocytopenia, unspecified: Secondary | ICD-10-CM | POA: Diagnosis present

## 2013-05-27 DIAGNOSIS — Z833 Family history of diabetes mellitus: Secondary | ICD-10-CM

## 2013-05-27 DIAGNOSIS — F329 Major depressive disorder, single episode, unspecified: Secondary | ICD-10-CM | POA: Diagnosis present

## 2013-05-27 DIAGNOSIS — K769 Liver disease, unspecified: Secondary | ICD-10-CM | POA: Diagnosis present

## 2013-05-27 DIAGNOSIS — F3289 Other specified depressive episodes: Secondary | ICD-10-CM | POA: Diagnosis present

## 2013-05-27 DIAGNOSIS — K652 Spontaneous bacterial peritonitis: Principal | ICD-10-CM | POA: Diagnosis present

## 2013-05-27 DIAGNOSIS — Z883 Allergy status to other anti-infective agents status: Secondary | ICD-10-CM

## 2013-05-27 DIAGNOSIS — E119 Type 2 diabetes mellitus without complications: Secondary | ICD-10-CM | POA: Diagnosis present

## 2013-05-27 DIAGNOSIS — Z7682 Awaiting organ transplant status: Secondary | ICD-10-CM

## 2013-05-27 DIAGNOSIS — Z87891 Personal history of nicotine dependence: Secondary | ICD-10-CM

## 2013-05-27 DIAGNOSIS — D61818 Other pancytopenia: Secondary | ICD-10-CM | POA: Diagnosis present

## 2013-05-27 DIAGNOSIS — Z8249 Family history of ischemic heart disease and other diseases of the circulatory system: Secondary | ICD-10-CM

## 2013-05-27 DIAGNOSIS — K7460 Unspecified cirrhosis of liver: Secondary | ICD-10-CM | POA: Diagnosis present

## 2013-05-27 DIAGNOSIS — B182 Chronic viral hepatitis C: Secondary | ICD-10-CM | POA: Diagnosis present

## 2013-05-27 DIAGNOSIS — I868 Varicose veins of other specified sites: Secondary | ICD-10-CM | POA: Diagnosis present

## 2013-05-27 DIAGNOSIS — Z9884 Bariatric surgery status: Secondary | ICD-10-CM

## 2013-05-28 ENCOUNTER — Emergency Department (HOSPITAL_COMMUNITY): Payer: BC Managed Care – PPO

## 2013-05-28 ENCOUNTER — Inpatient Hospital Stay (HOSPITAL_COMMUNITY)
Admission: EM | Admit: 2013-05-28 | Discharge: 2013-06-03 | DRG: 372 | Disposition: A | Discharge status: Home Health | Payer: BC Managed Care – PPO | Attending: Internal Medicine | Admitting: Internal Medicine

## 2013-05-28 ENCOUNTER — Encounter (HOSPITAL_COMMUNITY): Payer: Self-pay | Admitting: Emergency Medicine

## 2013-05-28 DIAGNOSIS — R188 Other ascites: Secondary | ICD-10-CM

## 2013-05-28 DIAGNOSIS — K7460 Unspecified cirrhosis of liver: Secondary | ICD-10-CM | POA: Diagnosis present

## 2013-05-28 DIAGNOSIS — N12 Tubulo-interstitial nephritis, not specified as acute or chronic: Secondary | ICD-10-CM

## 2013-05-28 DIAGNOSIS — R7309 Other abnormal glucose: Secondary | ICD-10-CM

## 2013-05-28 DIAGNOSIS — N201 Calculus of ureter: Secondary | ICD-10-CM

## 2013-05-28 DIAGNOSIS — S92309A Fracture of unspecified metatarsal bone(s), unspecified foot, initial encounter for closed fracture: Secondary | ICD-10-CM

## 2013-05-28 DIAGNOSIS — K7682 Hepatic encephalopathy: Secondary | ICD-10-CM

## 2013-05-28 DIAGNOSIS — N1330 Unspecified hydronephrosis: Secondary | ICD-10-CM

## 2013-05-28 DIAGNOSIS — R7881 Bacteremia: Secondary | ICD-10-CM

## 2013-05-28 DIAGNOSIS — G4700 Insomnia, unspecified: Secondary | ICD-10-CM

## 2013-05-28 DIAGNOSIS — D696 Thrombocytopenia, unspecified: Secondary | ICD-10-CM | POA: Diagnosis present

## 2013-05-28 DIAGNOSIS — K652 Spontaneous bacterial peritonitis: Principal | ICD-10-CM | POA: Diagnosis present

## 2013-05-28 DIAGNOSIS — R109 Unspecified abdominal pain: Secondary | ICD-10-CM

## 2013-05-28 DIAGNOSIS — K7290 Hepatic failure, unspecified without coma: Secondary | ICD-10-CM

## 2013-05-28 DIAGNOSIS — B182 Chronic viral hepatitis C: Secondary | ICD-10-CM

## 2013-05-28 DIAGNOSIS — D62 Acute posthemorrhagic anemia: Secondary | ICD-10-CM | POA: Diagnosis present

## 2013-05-28 DIAGNOSIS — R1012 Left upper quadrant pain: Secondary | ICD-10-CM

## 2013-05-28 DIAGNOSIS — R1011 Right upper quadrant pain: Secondary | ICD-10-CM

## 2013-05-28 DIAGNOSIS — S52509A Unspecified fracture of the lower end of unspecified radius, initial encounter for closed fracture: Secondary | ICD-10-CM

## 2013-05-28 DIAGNOSIS — R739 Hyperglycemia, unspecified: Secondary | ICD-10-CM | POA: Diagnosis present

## 2013-05-28 DIAGNOSIS — D649 Anemia, unspecified: Secondary | ICD-10-CM

## 2013-05-28 LAB — CBC WITH DIFFERENTIAL/PLATELET
Basophils Absolute: 0 10*3/uL (ref 0.0–0.1)
Basophils Relative: 0 % (ref 0–1)
EOS ABS: 0.1 10*3/uL (ref 0.0–0.7)
Eosinophils Relative: 2 % (ref 0–5)
HEMATOCRIT: 28.5 % — AB (ref 36.0–46.0)
HEMOGLOBIN: 9.2 g/dL — AB (ref 12.0–15.0)
LYMPHS PCT: 11 % — AB (ref 12–46)
Lymphs Abs: 0.6 10*3/uL — ABNORMAL LOW (ref 0.7–4.0)
MCH: 26.6 pg (ref 26.0–34.0)
MCHC: 32.3 g/dL (ref 30.0–36.0)
MCV: 82.4 fL (ref 78.0–100.0)
MONOS PCT: 7 % (ref 3–12)
Monocytes Absolute: 0.4 10*3/uL (ref 0.1–1.0)
NEUTROS ABS: 4 10*3/uL (ref 1.7–7.7)
Neutrophils Relative %: 80 % — ABNORMAL HIGH (ref 43–77)
Platelets: DECREASED 10*3/uL (ref 150–400)
RBC: 3.46 MIL/uL — ABNORMAL LOW (ref 3.87–5.11)
RDW: 15.9 % — ABNORMAL HIGH (ref 11.5–15.5)
Smear Review: DECREASED
WBC: 5.1 10*3/uL (ref 4.0–10.5)

## 2013-05-28 LAB — URINALYSIS, ROUTINE W REFLEX MICROSCOPIC
Bilirubin Urine: NEGATIVE
Glucose, UA: NEGATIVE mg/dL
Hgb urine dipstick: NEGATIVE
KETONES UR: NEGATIVE mg/dL
NITRITE: NEGATIVE
PROTEIN: NEGATIVE mg/dL
Specific Gravity, Urine: 1.02 (ref 1.005–1.030)
Urobilinogen, UA: 0.2 mg/dL (ref 0.0–1.0)
pH: 6.5 (ref 5.0–8.0)

## 2013-05-28 LAB — BODY FLUID CELL COUNT WITH DIFFERENTIAL
Eos, Fluid: 0 %
Lymphs, Fluid: 4 %
MONOCYTE-MACROPHAGE-SEROUS FLUID: 9 % — AB (ref 50–90)
Neutrophil Count, Fluid: 87 % — ABNORMAL HIGH (ref 0–25)
WBC FLUID: 4646 uL — AB (ref 0–1000)

## 2013-05-28 LAB — COMPREHENSIVE METABOLIC PANEL
ALK PHOS: 100 U/L (ref 39–117)
ALT: 25 U/L (ref 0–35)
AST: 25 U/L (ref 0–37)
Albumin: 3.2 g/dL — ABNORMAL LOW (ref 3.5–5.2)
BILIRUBIN TOTAL: 0.9 mg/dL (ref 0.3–1.2)
BUN: 14 mg/dL (ref 6–23)
CHLORIDE: 97 meq/L (ref 96–112)
CO2: 27 mEq/L (ref 19–32)
Calcium: 8.4 mg/dL (ref 8.4–10.5)
Creatinine, Ser: 0.92 mg/dL (ref 0.50–1.10)
GFR calc non Af Amer: 71 mL/min — ABNORMAL LOW (ref >90)
GFR, EST AFRICAN AMERICAN: 82 mL/min — AB (ref >90)
GLUCOSE: 182 mg/dL — AB (ref 70–99)
POTASSIUM: 3.9 meq/L (ref 3.7–5.3)
Sodium: 134 mEq/L — ABNORMAL LOW (ref 137–147)
Total Protein: 6.9 g/dL (ref 6.0–8.3)

## 2013-05-28 LAB — URINE MICROSCOPIC-ADD ON

## 2013-05-28 MED ORDER — MORPHINE SULFATE 2 MG/ML IJ SOLN
2.0000 mg | INTRAMUSCULAR | Status: DC | PRN
  Administered 2013-05-28 – 2013-06-03 (×25): 2 mg via INTRAVENOUS
  Filled 2013-05-28 (×25): qty 1

## 2013-05-28 MED ORDER — PANTOPRAZOLE SODIUM 40 MG PO TBEC
40.0000 mg | DELAYED_RELEASE_TABLET | Freq: Every day | ORAL | Status: DC
  Administered 2013-05-28 – 2013-06-01 (×5): 40 mg via ORAL
  Filled 2013-05-28 (×5): qty 1

## 2013-05-28 MED ORDER — FENTANYL CITRATE 0.05 MG/ML IJ SOLN
50.0000 ug | INTRAMUSCULAR | Status: DC | PRN
  Administered 2013-05-28: 50 ug via INTRAVENOUS
  Filled 2013-05-28: qty 2

## 2013-05-28 MED ORDER — ACETAMINOPHEN 325 MG PO TABS: 650.0000 mg | ORAL_TABLET | Freq: Four times a day (QID) | ORAL | Status: DC | PRN

## 2013-05-28 MED ORDER — DEXTROSE 5 % IV SOLN: 2.0000 g | Freq: Once | INTRAVENOUS | Status: DC

## 2013-05-28 MED ORDER — ALBUMIN HUMAN 25 % IV SOLN
25.0000 g | Freq: Once | INTRAVENOUS | Status: AC
  Administered 2013-05-28: 25 g via INTRAVENOUS
  Filled 2013-05-28: qty 500

## 2013-05-28 MED ORDER — MORPHINE SULFATE 4 MG/ML IJ SOLN
4.0000 mg | Freq: Once | INTRAMUSCULAR | Status: AC
  Administered 2013-05-28: 4 mg via INTRAVENOUS
  Filled 2013-05-28: qty 1

## 2013-05-28 MED ORDER — IMIPENEM-CILASTATIN 500 MG IV SOLR
500.0000 mg | Freq: Three times a day (TID) | INTRAVENOUS | Status: DC
  Administered 2013-05-28 – 2013-05-29 (×2): 500 mg via INTRAVENOUS
  Filled 2013-05-28 (×6): qty 500

## 2013-05-28 MED ORDER — MORPHINE SULFATE 4 MG/ML IJ SOLN
4.0000 mg | Freq: Once | INTRAMUSCULAR | Status: AC
  Administered 2013-05-28: 4 mg via INTRAVENOUS

## 2013-05-28 MED ORDER — BACLOFEN 10 MG PO TABS
10.0000 mg | ORAL_TABLET | Freq: Two times a day (BID) | ORAL | Status: DC
  Administered 2013-05-28 – 2013-06-03 (×13): 10 mg via ORAL
  Filled 2013-05-28 (×13): qty 1

## 2013-05-28 MED ORDER — ACETAMINOPHEN 650 MG RE SUPP: 650.0000 mg | Freq: Four times a day (QID) | RECTAL | Status: DC | PRN

## 2013-05-28 MED ORDER — CYCLOBENZAPRINE HCL 10 MG PO TABS
10.0000 mg | ORAL_TABLET | Freq: Every day | ORAL | Status: DC
  Administered 2013-05-28 – 2013-06-02 (×6): 10 mg via ORAL
  Filled 2013-05-28 (×6): qty 1

## 2013-05-28 MED ORDER — TRAZODONE HCL 50 MG PO TABS
50.0000 mg | ORAL_TABLET | Freq: Every day | ORAL | Status: DC
  Administered 2013-05-28 – 2013-06-02 (×6): 50 mg via ORAL
  Filled 2013-05-28 (×6): qty 1

## 2013-05-28 MED ORDER — RIFAXIMIN 550 MG PO TABS
550.0000 mg | ORAL_TABLET | Freq: Two times a day (BID) | ORAL | Status: DC
  Administered 2013-05-28 – 2013-06-03 (×12): 550 mg via ORAL
  Filled 2013-05-28 (×16): qty 1

## 2013-05-28 MED ORDER — ONDANSETRON HCL 4 MG/2ML IJ SOLN
4.0000 mg | Freq: Four times a day (QID) | INTRAMUSCULAR | Status: DC | PRN
  Administered 2013-05-28 – 2013-06-02 (×4): 4 mg via INTRAVENOUS
  Filled 2013-05-28 (×4): qty 2

## 2013-05-28 MED ORDER — CIPROFLOXACIN IN D5W 400 MG/200ML IV SOLN
400.0000 mg | Freq: Two times a day (BID) | INTRAVENOUS | Status: DC
  Administered 2013-05-28: 400 mg via INTRAVENOUS
  Filled 2013-05-28: qty 200

## 2013-05-28 MED ORDER — ONDANSETRON HCL 4 MG PO TABS: 4.0000 mg | ORAL_TABLET | Freq: Four times a day (QID) | ORAL | Status: DC | PRN

## 2013-05-28 MED ORDER — IOHEXOL 300 MG/ML  SOLN
50.0000 mL | Freq: Once | INTRAMUSCULAR | Status: AC | PRN
  Administered 2013-05-28: 50 mL via ORAL

## 2013-05-28 MED ORDER — MORPHINE SULFATE 4 MG/ML IJ SOLN
INTRAMUSCULAR | Status: AC
  Filled 2013-05-28: qty 1

## 2013-05-28 MED ORDER — FERROUS SULFATE 325 (65 FE) MG PO TABS
325.0000 mg | ORAL_TABLET | Freq: Two times a day (BID) | ORAL | Status: DC
  Administered 2013-05-28 – 2013-06-01 (×8): 325 mg via ORAL
  Filled 2013-05-28 (×8): qty 1

## 2013-05-28 MED ORDER — SODIUM CHLORIDE 0.9 % IV SOLN
INTRAVENOUS | Status: AC
  Filled 2013-05-28 (×2): qty 500

## 2013-05-28 MED ORDER — METRONIDAZOLE IN NACL 5-0.79 MG/ML-% IV SOLN
500.0000 mg | Freq: Three times a day (TID) | INTRAVENOUS | Status: DC
  Filled 2013-05-28: qty 100

## 2013-05-28 MED ORDER — SODIUM CHLORIDE 0.9 % IV SOLN
INTRAVENOUS | Status: DC
  Administered 2013-05-28: 20:00:00 via INTRAVENOUS
  Administered 2013-05-31: 20 mL/h via INTRAVENOUS

## 2013-05-28 MED ORDER — MIRTAZAPINE 30 MG PO TABS
15.0000 mg | ORAL_TABLET | Freq: Every day | ORAL | Status: DC
  Administered 2013-05-28 – 2013-06-02 (×6): 15 mg via ORAL
  Filled 2013-05-28 (×6): qty 1

## 2013-05-28 MED ORDER — IOHEXOL 300 MG/ML  SOLN
100.0000 mL | Freq: Once | INTRAMUSCULAR | Status: AC | PRN
  Administered 2013-05-28: 100 mL via INTRAVENOUS

## 2013-05-28 MED ORDER — DULOXETINE HCL 20 MG PO CPEP
20.0000 mg | ORAL_CAPSULE | Freq: Every day | ORAL | Status: DC
  Administered 2013-05-28 – 2013-06-03 (×7): 20 mg via ORAL
  Filled 2013-05-28 (×9): qty 1

## 2013-05-28 MED ORDER — CIPROFLOXACIN IN D5W 400 MG/200ML IV SOLN
400.0000 mg | Freq: Once | INTRAVENOUS | Status: AC
  Administered 2013-05-28: 400 mg via INTRAVENOUS
  Filled 2013-05-28: qty 200

## 2013-05-28 MED ORDER — ADULT MULTIVITAMIN W/MINERALS CH
1.0000 | ORAL_TABLET | Freq: Every day | ORAL | Status: DC
  Administered 2013-05-28 – 2013-06-03 (×7): 1 via ORAL
  Filled 2013-05-28 (×7): qty 1

## 2013-05-28 MED ORDER — SODIUM CHLORIDE 0.9 % IV SOLN
INTRAVENOUS | Status: DC
  Administered 2013-05-28: 12:00:00 via INTRAVENOUS

## 2013-05-28 NOTE — Procedures (Signed)
PreOperative Dx: Cirrhosis, ascites Postoperative Dx: Cirrhosis, ascites Procedure:   US guided paracentesis Radiologist:  Thornton Papas Anesthesia:  10 ml of 1% lidocaine Specimen:  3000 ml of amber colored ascitic fluid EBL:   < 1 ml Complications: None

## 2013-05-28 NOTE — Progress Notes (Signed)
Utilization Review Complete  

## 2013-05-28 NOTE — ED Provider Notes (Signed)
CSN: [id number]     Arrival date & time 05/27/13  2356 History   First MD Initiated Contact with Patient 05/28/13 [phone]     Chief Complaint  Patient presents with  . Abdominal Pain     (Consider location/radiation/quality/duration/timing/severity/associated sxs/prior Treatment) Patient is a 53 y.o. female presenting with abdominal pain. The history is provided by the patient.  Abdominal Pain She has a history of cirrhosis. She relates that she has had chills and sweats all day long but denies fever. At 8 PM, she started having pain on the right side of her abdomen with radiation to the back into the right shoulder. Pain was severe and she rated it at 9/10. Nothing made the pain better nothing made it worse. She denies nausea or vomiting or constipation or diarrhea. She denies dysuria. She does have history of kidney stones and also has a history of having had spontaneous bacterial peritonitis. She did not take anything for pain but did try soaking in a hot tub with no relief.  Past Medical History  Diagnosis Date  . Esophageal varices   . Cirrhosis     non alcoholic  . Ascites   . Anemia   . Liver failure     "I'm on liver transplant list @ [facility]" (11/07/2012)  . Portal vein thrombosis   . Complication of anesthesia   . PONV (postoperative nausea and vomiting)   . Heart murmur   . Type II diabetes mellitus     "not since gastric bypass" (11/07/2012)  . Hepatitis C     Hx: of Hep "C" it was eradicated  . [id number]     "monthly" (11/07/2012)  . Anxiety   . Depression   . Kidney stones    Past Surgical History  Procedure Laterality Date  . Abdominal hysterectomy  2003  . Upper gastrointestinal endoscopy    . Colonoscopy    . Gastric bypass  ~ 1995  . Cesarean section  1995  . Lithotripsy      "several times" (11/07/2012)  . Knee arthroscopy Right   . Colonoscopy  10/21/2011    Procedure: COLONOSCOPY;  Surgeon: Rogene Houston, MD;  [facility];  Service:  Endoscopy;  Laterality: N/A;  245   . Cystoscopy with stent placement  12/08/2011    Procedure: CYSTOSCOPY WITH STENT PLACEMENT;  Surgeon: Marissa Nestle, MD;  Location: [facility];  Service: Urology;  Laterality: Right;  . Cystoscopy w/ retrogrades  12/08/2011    Procedure: CYSTOSCOPY WITH RETROGRADE PYELOGRAM;  Surgeon: Marissa Nestle, MD;  Location: [facility];  Service: Urology;  Laterality: Right;  . Dilation and curettage of uterus    . Orif distal radius fracture Right 11/07/2012  . Cholecystectomy  1987  . Hernia repair  04/14/11    "one in my bellybutton" (11/07/2012)  . Inguinal hernia repair Right     "maybe 2" (11/07/2012)  . Open reduction internal fixation (orif) distal radial fracture Right 11/07/2012    Procedure: OPEN REDUCTION INTERNAL FIXATION (ORIF) RIGHT DISTAL RADIAL FRACTURE;  Surgeon: [name], MD;  Location: [place];  Service: Orthopedics;  Laterality: Right;   Family History  Problem Relation Age of Onset  . Dementia Mother   . Heart disease Father   . Liver disease Father   . Hypertension Father   . Diabetes Father   . Dementia Father   . Hypertension Brother    History  Substance Use Topics  . Smoking status: Former Smoker --  0.50 packs/day for 5 years    Types: Cigarettes    Quit date: 05/02/1991  . Smokeless tobacco: Never Used  . Alcohol Use: No   OB History   Grav Para Term Preterm Abortions TAB SAB Ect Mult Living                 Review of Systems  Gastrointestinal: Positive for abdominal pain.  All other systems reviewed and are negative.     Allergies  Ancef  Home Medications   Prior to Admission medications   Medication Sig Start Date End Date Taking? Authorizing Provider  b complex vitamins tablet Take 1 tablet by mouth daily.    Historical Provider, MD  baclofen (LIORESAL) 10 MG tablet Take 10 mg by mouth as needed.    Historical Provider, MD  cyclobenzaprine (FLEXERIL) 10 MG tablet Take 10 mg by mouth at bedtime.  03/30/13    Historical Provider, MD  docusate sodium (COLACE) 100 MG capsule Take 1 capsule (100 mg total) by mouth 2 (two) times daily. 11/07/12   [name], MD  DULoxetine (CYMBALTA) 20 MG capsule Take 20 mg by mouth daily.  01/22/13   Historical Provider, MD  ferrous sulfate 325 (65 FE) MG tablet Take 325 mg by mouth 2 (two) times daily with a meal.    Historical Provider, MD  furosemide (LASIX) 40 MG tablet Take 80 mg by mouth daily.     Historical Provider, MD  mirtazapine (REMERON) 15 MG tablet Take 15 mg by mouth at bedtime.    Historical Provider, MD  Multiple Vitamin (MULTIVITAMIN WITH MINERALS) TABS tablet Take 1 tablet by mouth daily.    Historical Provider, MD  nadolol (CORGARD) 20 MG tablet Take 10 mg by mouth every morning.     Historical Provider, MD  omeprazole (PRILOSEC) 20 MG capsule Take 1 capsule (20 mg total) by mouth 2 (two) times daily before a meal. 12/24/12   Rogene Houston, MD  rifaximin (XIFAXAN) 550 MG TABS Take 550 mg by mouth 2 (two) times daily.    Historical Provider, MD  spironolactone (ALDACTONE) 100 MG tablet Take 300 mg by mouth daily.     Historical Provider, MD  traZODone (DESYREL) 50 MG tablet Take 1 tablet (50 mg total) by mouth at bedtime. 09/25/12   Rogene Houston, MD   BP 158/65  Pulse 105  Temp(Src) 99.4 F (37.4 C) (Oral)  Resp 18  Ht 5\' 6"  (1.676 m)  Wt 145 lb (65.772 kg)  BMI 23.41 kg/m2  SpO2 100% Physical Exam  Nursing note and vitals reviewed.  53 year old female, resting comfortably and in no acute distress. Vital signs are significant for tachycardia with heart rate 105, and hypertension with blood pressure 150/65. Oxygen saturation is 100%, which is normal. Head is normocephalic and atraumatic. PERRLA, EOMI. Oropharynx is clear. Neck is nontender and supple without adenopathy or JVD. Back is nontender and there is no CVA tenderness. Lungs are clear without rales, wheezes, or rhonchi. Chest is nontender. Heart has regular rate and rhythm  without murmur. Abdomen is distended, soft, with moderate tenderness on the right side of the abdomen. Moderate to large amount of ascites is present. There is no rebound or guarding. There are no masses or hepatosplenomegaly and peristalsis is normoactive. Extremities have no cyanosis or edema, full range of motion is present. Skin is warm and dry without rash. Neurologic: Mental status is normal, cranial nerves are intact, there are no motor or sensory deficits.  ED Course  Procedures (including critical care time) Labs Review Results for orders placed during the hospital encounter of 05/28/13  CBC WITH DIFFERENTIAL      Result Value Ref Range   WBC 5.1  4.0 - 10.5 K/uL   RBC 3.46 (*) 3.87 - 5.11 MIL/uL   Hemoglobin 9.2 (*) 12.0 - 15.0 g/dL   HCT 28.5 (*) 36.0 - 46.0 %   MCV 82.4  78.0 - 100.0 fL   MCH 26.6  26.0 - 34.0 pg   MCHC 32.3  30.0 - 36.0 g/dL   RDW 15.9 (*) 11.5 - 15.5 %   Platelets    150 - 400 K/uL   Value: PLATELET CLUMPS NOTED ON SMEAR, COUNT APPEARS DECREASED   Neutrophils Relative % 80 (*) 43 - 77 %   Lymphocytes Relative 11 (*) 12 - 46 %   Monocytes Relative 7  3 - 12 %   Eosinophils Relative 2  0 - 5 %   Basophils Relative 0  0 - 1 %   Neutro Abs 4.0  1.7 - 7.7 K/uL   Lymphs Abs 0.6 (*) 0.7 - 4.0 K/uL   Monocytes Absolute 0.4  0.1 - 1.0 K/uL   Eosinophils Absolute 0.1  0.0 - 0.7 K/uL   Basophils Absolute 0.0  0.0 - 0.1 K/uL   Smear Review       Value: PLATELET CLUMPS NOTED ON SMEAR, COUNT APPEARS DECREASED  COMPREHENSIVE METABOLIC PANEL      Result Value Ref Range   Sodium 134 (*) 137 - 147 mEq/L   Potassium 3.9  3.7 - 5.3 mEq/L   Chloride 97  96 - 112 mEq/L   CO2 27  19 - 32 mEq/L   Glucose, Bld 182 (*) 70 - 99 mg/dL   BUN 14  6 - 23 mg/dL   Creatinine, Ser 0.92  0.50 - 1.10 mg/dL   Calcium 8.4  8.4 - 10.5 mg/dL   Total Protein 6.9  6.0 - 8.3 g/dL   Albumin 3.2 (*) 3.5 - 5.2 g/dL   AST 25  0 - 37 U/L   ALT 25  0 - 35 U/L   Alkaline Phosphatase 100   39 - 117 U/L   Total Bilirubin 0.9  0.3 - 1.2 mg/dL   GFR calc non Af Amer 71 (*) >90 mL/min   GFR calc Af Amer 82 (*) >90 mL/min  URINALYSIS, ROUTINE W REFLEX MICROSCOPIC      Result Value Ref Range   Color, Urine YELLOW  YELLOW   APPearance CLEAR  CLEAR   Specific Gravity, Urine 1.020  1.005 - 1.030   pH 6.5  5.0 - 8.0   Glucose, UA NEGATIVE  NEGATIVE mg/dL   Hgb urine dipstick NEGATIVE  NEGATIVE   Bilirubin Urine NEGATIVE  NEGATIVE   Ketones, ur NEGATIVE  NEGATIVE mg/dL   Protein, ur NEGATIVE  NEGATIVE mg/dL   Urobilinogen, UA 0.2  0.0 - 1.0 mg/dL   Nitrite NEGATIVE  NEGATIVE   Leukocytes, UA TRACE (*) NEGATIVE  URINE MICROSCOPIC-ADD ON      Result Value Ref Range   Squamous Epithelial / LPF MANY (*) RARE   WBC, UA 0-2  <3 WBC/hpf   RBC / HPF 0-2  <3 RBC/hpf   Bacteria, UA RARE  RARE   Imaging Review Ct Abdomen Pelvis W Contrast  05/28/2013   CLINICAL DATA:  Abdominal pain. History of liver failure. Gastric bypass.  EXAM: CT ABDOMEN AND PELVIS WITH CONTRAST  TECHNIQUE:  Multidetector CT imaging of the abdomen and pelvis was performed using the standard protocol following bolus administration of intravenous contrast.  CONTRAST:  37mL OMNIPAQUE IOHEXOL 300 MG/ML SOLN, 148mL OMNIPAQUE IOHEXOL 300 MG/ML SOLN  COMPARISON:  US PARACENTESIS dated 01/18/2013; CT ABD - PELV W/ CM dated 06/29/2012  FINDINGS: The lung bases are clear.  Changes of hepatic cirrhosis with marked splenic enlargement. Portal veins demonstrate evidence of cavernous transformation. Diffuse abdominal free fluid consistent with ascites. This has progressed since the previous study. The gallbladder is surgically absent. No bile duct dilatation. Pancreas and adrenal glands are normal. Multiple nonobstructing stones in the right kidney. No hydronephrosis in either kidney. Postoperative changes consistent with history of gastric bypass. Upper abdominal varices. Small bowel and colon are not abnormally distended. No free air.  Surgical clips in the mesentery.  Pelvis: Ascites extends into the pelvis. Bladder wall is not thickened. No pelvic mass or lymphadenopathy. Appendix is normal. No diverticulitis. No destructive bone lesions. Mild degenerative changes in the spine.  IMPRESSION: Hepatic cirrhosis with portal venous hypertension and splenomegaly as well as varices. Interval increase of amount of ascites. Multiple nonobstructing right renal stones. Postoperative cholecystectomy and gastric bypass.   Electronically Signed   By: Lucienne Capers M.D.   On: 05/28/2013 04:57    MDM   Final diagnoses:  Abdominal pain  Cirrhosis  Ascites  Anemia    Abdominal pain of uncertain cause. Doubt spontaneous bacterial peritonitis with normal WBC and without any tenderness on the left side of the abdomen. Of note, hemoglobin is seen to have dropped 2 g from last recorded value in October of 2014. However, patient has paperwork with her from [facility] and CBC done on March 26 had hemoglobin of 9.0 which is actually a little lower than what she has today. In any case, this would indicate that she has not had any recent, acute blood loss. She will be sent for CT scan for further evaluation of her abdominal pain.  CT shows increased amount of ascites compared with recent ultrasound but no obvious cause for her pain. She will be sent for paracentesis. I doubt bacterial peritonitis but wonder if some of her pain may be mechanical related to increased volume of ascites. Case signed out to Dr. Thurnell Garbe.  Delora Fuel, MD [id number]

## 2013-05-28 NOTE — ED Notes (Signed)
Pt reports hospitalist told her she could eat and drink. Pt eating chips and drinking a soft drink. Pt rates pain at a 6/10 but does not want pain meds at this time.

## 2013-05-28 NOTE — ED Notes (Signed)
Pt reporting generalized abdominal pain since about 8pm.  Denies nausea or vomiting.  Pt abdomen somewhat distended.  Pt reports being on liver transplant list.

## 2013-05-28 NOTE — H&P (Addendum)
History and Physical  [name] [id number] DOB: September 17, 1960 DOA: 05/28/2013   PCP: Glo Herring., MD   Chief Complaint: Abdominal pain  HPI:  53 year old female with a history of chronic hepatitis C with hepatic cirrhosis, portal vein thrombosis, esophageal varices presents with one-day history of excruciating abdominal pain. The patient states that she had an episode of spontaneous bacterial peritonitis (SBP) approximately 8 years ago. She is not on any SBP prophylaxis. The patient states that she last followed up with Dr. Laural Golden in March 2015. She endorses compliance with all her medications. She is currently undergoing evaluation for liver transplantation at [facility], but she is not on the transplant list formally at [facility].  Her transplant coordinator is [employer].  However, she is on the transplant list at [facility] [place].  Patient complains of subjective fevers and chills but denies any chest pain, shortness breath, vomiting, diarrhea, hematochezia, melena, hematemesis, headache, dizziness. There is no dysuria or hematuria. CT of the abdomen and pelvis revealed cirrhosis with splenomegaly and ascites. There was upper abdominal varices and evidence of prior gastric bypass.  In the emergency department, the patient had a paracentesis removing 3 L of fluid. Ascites showed WBC 4646 with 87% neutrophils. Gram stain was negative for any organisms. The patient was given a dose of ciprofloxacin and 4 mg of intravenous morphine. CBC revealed WBC 5.1, hemoglobin 9.2.  There was platelet clumping. BMP was unremarkable. Hepatic enzymes were unremarkable.  Assessment/Plan:  spontaneous bacterial peritonitis  -Start empiric imipenem as pt has possible allergy to cephalosporins and has developed SBP while taking cipro prophylaxis -updated Dr. Monica Martinez (hepatology at [facility])--he requested INR for am lab -Followup ascites cultures  -Give 500 cc of 5% IV abumin as the patient's blood pressure  is soft post paracentesis  -Ascites shows WBC 4646 with 87% neutrophils   hepatitis C with liver cirrhosis -Patient is being evaluated at [facility] for liver transplantation -Follows Dr. Laural Golden locally -Hold Lasix and spironolactone for today with soft blood pressure -Reevaluate for possible restart on 05/29/2013 -Hold nadalol due to soft blood pressure - continue rifaximin  Anemia -Multifactorial including chronic disease and iron deficiency -Check iron studies -Baseline hemoglobin 10-11 Thrombocytopenia -Due to liver cirrhosis -Although there was platelet clumping on the CBC today, the patient has history of thrombocytopenia -SCDs for DVT prophylaxis  depression/anxiety  -Continue home medications  Hyperglycemia -Check hemoglobin A1c     Past Medical History  Diagnosis Date  . Esophageal varices   . Cirrhosis     non alcoholic  . Ascites   . Anemia   . Liver failure     "I'm on liver transplant list @ [facility]" (11/07/2012)  . Portal vein thrombosis   . Complication of anesthesia   . PONV (postoperative nausea and vomiting)   . Heart murmur   . Type II diabetes mellitus     "not since gastric bypass" (11/07/2012)  . Hepatitis C     Hx: of Hep "C" it was eradicated  . [id number]     "monthly" (11/07/2012)  . Anxiety   . Depression   . Kidney stones    Past Surgical History  Procedure Laterality Date  . Abdominal hysterectomy  2003  . Upper gastrointestinal endoscopy    . Colonoscopy    . Gastric bypass  ~ 1995  . Cesarean section  1995  . Lithotripsy      "several times" (11/07/2012)  . Knee arthroscopy Right   .  Colonoscopy  10/21/2011    Procedure: COLONOSCOPY;  Surgeon: Rogene Houston, MD;  [facility];  Service: Endoscopy;  Laterality: N/A;  245   . Cystoscopy with stent placement  12/08/2011    Procedure: CYSTOSCOPY WITH STENT PLACEMENT;  Surgeon: Marissa Nestle, MD;  [facility];  Service: Urology;  Laterality: Right;  .  Cystoscopy w/ retrogrades  12/08/2011    Procedure: CYSTOSCOPY WITH RETROGRADE PYELOGRAM;  Surgeon: Marissa Nestle, MD;  Location: [facility];  Service: Urology;  Laterality: Right;  . Dilation and curettage of uterus    . Orif distal radius fracture Right 11/07/2012  . Cholecystectomy  1987  . Hernia repair  04/14/11    "one in my bellybutton" (11/07/2012)  . Inguinal hernia repair Right     "maybe 2" (11/07/2012)  . Open reduction internal fixation (orif) distal radial fracture Right 11/07/2012    Procedure: OPEN REDUCTION INTERNAL FIXATION (ORIF) RIGHT DISTAL RADIAL FRACTURE;  Surgeon: [name], MD;  Location: [place];  Service: Orthopedics;  Laterality: Right;   Social History:  reports that she quit smoking about 22 years ago. Her smoking use included Cigarettes. She has a 2.5 pack-year smoking history. She has never used smokeless tobacco. She reports that she does not drink alcohol or use illicit drugs.   Family History  Problem Relation Age of Onset  . Dementia Mother   . Heart disease Father   . Liver disease Father   . Hypertension Father   . Diabetes Father   . Dementia Father   . Hypertension Brother      Allergies  Allergen Reactions  . Ancef [Cefazolin Sodium] Other (See Comments)    "Blisters in mouth and vagina"      Prior to Admission medications   Medication Sig Start Date End Date Taking? Authorizing Provider  b complex vitamins tablet Take 1 tablet by mouth daily.   Yes Historical Provider, MD  baclofen (LIORESAL) 10 MG tablet Take 10 mg by mouth 2 (two) times daily.    Yes Historical Provider, MD  cyclobenzaprine (FLEXERIL) 10 MG tablet Take 10 mg by mouth at bedtime.  03/30/13  Yes Historical Provider, MD  DULoxetine (CYMBALTA) 20 MG capsule Take 20 mg by mouth daily.  01/22/13  Yes Historical Provider, MD  ferrous sulfate 325 (65 FE) MG tablet Take 325 mg by mouth 2 (two) times daily with a meal.   Yes Historical Provider, MD  furosemide (LASIX) 40 MG  tablet Take 80 mg by mouth daily.    Yes Historical Provider, MD  mirtazapine (REMERON) 15 MG tablet Take 15 mg by mouth at bedtime.   Yes Historical Provider, MD  Multiple Vitamin (MULTIVITAMIN WITH MINERALS) TABS tablet Take 1 tablet by mouth daily.   Yes Historical Provider, MD  nadolol (CORGARD) 20 MG tablet Take 10 mg by mouth every morning.    Yes Historical Provider, MD  omeprazole (PRILOSEC) 20 MG capsule Take 1 capsule (20 mg total) by mouth 2 (two) times daily before a meal. 12/24/12  Yes Rogene Houston, MD  rifaximin (XIFAXAN) 550 MG TABS Take 550 mg by mouth 2 (two) times daily.   Yes Historical Provider, MD  spironolactone (ALDACTONE) 100 MG tablet Take 300 mg by mouth daily.    Yes Historical Provider, MD  traZODone (DESYREL) 50 MG tablet Take 1 tablet (50 mg total) by mouth at bedtime. 09/25/12  Yes Rogene Houston, MD    Review of Systems:  Constitutional:  No weight loss, night sweats, Head&Eyes: No headache.  No vision loss.  No eye pain or scotoma ENT:  No Difficulty swallowing,Tooth/dental problems,Sore throat,   Cardio-vascular:  No chest pain, Orthopnea, PND, swelling in lower extremities,  dizziness, palpitations  GI:  No   vomiting, diarrhea, loss of appetite, hematochezia, melena, heartburn, indigestion, Resp:  No shortness of breath with exertion or at rest. No cough. No coughing up of blood .No wheezing.No chest wall deformity  Skin:  no rash or lesions.  GU:  no dysuria, change in color of urine, no urgency or frequency. No flank pain.  Musculoskeletal:  No joint pain or swelling. No decreased range of motion. No back pain.  Psych:  No change in mood or affect.  Neurologic: No headache, no dysesthesia, no focal weakness, no vision loss. No syncope  Physical Exam: Filed Vitals:   05/28/13 0029 05/28/13 0646 05/28/13 1114 05/28/13 1308  BP: 158/65 106/50 102/50 110/63  Pulse: 105 88 81 86  Temp: 99.4 F (37.4 C) 98.1 F (36.7 C)    TempSrc: Oral  Oral    Resp: 18 16  18   Height: 5\' 6"  (1.676 m)     Weight: 65.772 kg (145 lb)     SpO2: 100% 97% 97% 96%   General:  A&O x 3, NAD, nontoxic, pleasant/cooperative Head/Eye: No conjunctival hemorrhage, no icterus, [place]/AT, No nystagmus ENT:  No icterus,  No thrush, good dentition, no pharyngeal exudate Neck:  No masses, no lymphadenpathy, no bruits CV:  RRR, no rub, no gallop, no S3 Lung:  CTAB, good air movement, no wheeze, no rhonchi Abdomen: soft/diffusely tender without rebound tenderness, +BS, nondistended, no peritoneal signs Ext: No cyanosis, No rashes, No petechiae, No lymphangitis, No edema   Labs on Admission:  Basic Metabolic Panel:  Recent Labs Lab 05/28/13 0200  NA 134*  K 3.9  CL 97  CO2 27  GLUCOSE 182*  BUN 14  CREATININE 0.92  CALCIUM 8.4   Liver Function Tests:  Recent Labs Lab 05/28/13 0200  AST 25  ALT 25  ALKPHOS 100  BILITOT 0.9  PROT 6.9  ALBUMIN 3.2*   No results found for this basename: LIPASE, AMYLASE,  in the last 168 hours No results found for this basename: AMMONIA,  in the last 168 hours CBC:  Recent Labs Lab 05/28/13 0200  WBC 5.1  NEUTROABS 4.0  HGB 9.2*  HCT 28.5*  MCV 82.4  PLT PLATELET CLUMPS NOTED ON SMEAR, COUNT APPEARS DECREASED   Cardiac Enzymes: No results found for this basename: CKTOTAL, CKMB, CKMBINDEX, TROPONINI,  in the last 168 hours BNP: No components found with this basename: POCBNP,  CBG: No results found for this basename: GLUCAP,  in the last 168 hours  Radiological Exams on Admission: Ct Abdomen Pelvis W Contrast  05/28/2013   CLINICAL DATA:  Abdominal pain. History of liver failure. Gastric bypass.  EXAM: CT ABDOMEN AND PELVIS WITH CONTRAST  TECHNIQUE: Multidetector CT imaging of the abdomen and pelvis was performed using the standard protocol following bolus administration of intravenous contrast.  CONTRAST:  75mL OMNIPAQUE IOHEXOL 300 MG/ML SOLN, 127mL OMNIPAQUE IOHEXOL 300 MG/ML SOLN  COMPARISON:   US PARACENTESIS dated 01/18/2013; CT ABD - PELV W/ CM dated 06/29/2012  FINDINGS: The lung bases are clear.  Changes of hepatic cirrhosis with marked splenic enlargement. Portal veins demonstrate evidence of cavernous transformation. Diffuse abdominal free fluid consistent with ascites. This has progressed since the previous study. The gallbladder is surgically absent. No bile  duct dilatation. Pancreas and adrenal glands are normal. Multiple nonobstructing stones in the right kidney. No hydronephrosis in either kidney. Postoperative changes consistent with history of gastric bypass. Upper abdominal varices. Small bowel and colon are not abnormally distended. No free air. Surgical clips in the mesentery.  Pelvis: Ascites extends into the pelvis. Bladder wall is not thickened. No pelvic mass or lymphadenopathy. Appendix is normal. No diverticulitis. No destructive bone lesions. Mild degenerative changes in the spine.  IMPRESSION: Hepatic cirrhosis with portal venous hypertension and splenomegaly as well as varices. Interval increase of amount of ascites. Multiple nonobstructing right renal stones. Postoperative cholecystectomy and gastric bypass.   Electronically Signed   By: Lucienne Capers M.D.   On: 05/28/2013 04:57   US Paracentesis  05/28/2013   CLINICAL DATA:  Cirrhosis, ascites, abdominal pain  EXAM: ULTRASOUND GUIDED diagnostic and therapeutic PARACENTESIS  COMPARISON:  01/18/2013  PROCEDURE: An ultrasound guided paracentesis was thoroughly discussed with the patient and questions answered. The benefits, risks, alternatives and complications were also discussed. The patient understands and wishes to proceed with the procedure. Written consent was obtained. Timeout protocol followed.  Ultrasound was performed to localize and mark an adequate pocket of fluid in the right quadrant of the abdomen. The area was then prepped and draped in the normal sterile fashion. 1% Lidocaine was used for local anesthesia.  Under ultrasound guidance a 5 [place] Yueh catheter was introduced. Paracentesis was performed. The catheter was removed and a dressing applied.  Complications: None  FINDINGS: A total of approximately 3000 mL of amber colored fluid was removed. A fluid sample of 180 mL was sent for laboratory analysis.  IMPRESSION: Successful ultrasound guided paracentesis yielding 3000 mL of amber colored ascites.   Electronically Signed   By: Lavonia Dana M.D.   On: 05/28/2013 11:23        Time spent:60 minutes Code Status:   FULL Family Communication:   No Family at bedside   Orson Eva, DO  [facility] Pager [phone]  If 7PM-7AM, please contact night-coverage [email or web address] Password [email or web address] 05/28/2013, 1:33 PM

## 2013-05-28 NOTE — ED Provider Notes (Signed)
Pt received at change of shift with paracentesis pending. 53yo F, c/o abd pain and subjective fevers/chills since yesterday. No N/V/D. WBC count normal, afebrile, CT A/P with increasing ascites. Paracentesis completed: fluid with elevated WBC count/PMN's. Will tx with IV abx for SBP, admit. Tried to call pt's GI Dr. Laural Golden, unable to reach. T/C to GI Dr. Loree Fee, case discussed, including:  HPI, pertinent PM/SHx, VS/PE, dx testing, ED course and treatment:  Agrees with ED plan, requests to continue to try to call pt's GI MD Rehman, admit to medicine service. T/C to [facility] Dr. Carles Collet, case discussed, including:  HPI, pertinent PM/SHx, VS/PE, dx testing, ED course and treatment:  Agreeable to admit, requests he will come to the ED for evaluation.     Natasha Feller, DO 05/28/13 1319

## 2013-05-28 NOTE — ED Notes (Signed)
Report given to receiving RN. Pt ready for transport. 

## 2013-05-28 NOTE — Progress Notes (Signed)
Paracentesis complete no signs of distress. 3000 ml amber colored abdominal fluid removed.

## 2013-05-29 DIAGNOSIS — D696 Thrombocytopenia, unspecified: Secondary | ICD-10-CM

## 2013-05-29 DIAGNOSIS — R1011 Right upper quadrant pain: Secondary | ICD-10-CM

## 2013-05-29 DIAGNOSIS — R188 Other ascites: Secondary | ICD-10-CM

## 2013-05-29 DIAGNOSIS — R109 Unspecified abdominal pain: Secondary | ICD-10-CM

## 2013-05-29 LAB — BASIC METABOLIC PANEL
BUN: 11 mg/dL (ref 6–23)
CHLORIDE: 102 meq/L (ref 96–112)
CO2: 27 meq/L (ref 19–32)
Calcium: 8.2 mg/dL — ABNORMAL LOW (ref 8.4–10.5)
Creatinine, Ser: 0.84 mg/dL (ref 0.50–1.10)
GFR calc Af Amer: >90 mL/min (ref >90)
GFR calc non Af Amer: 79 mL/min — ABNORMAL LOW (ref >90)
Glucose, Bld: 171 mg/dL — ABNORMAL HIGH (ref 70–99)
POTASSIUM: 3.8 meq/L (ref 3.7–5.3)
Sodium: 138 mEq/L (ref 137–147)

## 2013-05-29 LAB — IRON AND TIBC
Iron: 37 ug/dL — ABNORMAL LOW (ref 42–135)
SATURATION RATIOS: 12 % — AB (ref 20–55)
TIBC: 310 ug/dL (ref 250–470)
UIBC: 273 ug/dL (ref 125–400)

## 2013-05-29 LAB — PROTIME-INR
INR: 1.83 — ABNORMAL HIGH (ref 0.00–1.49)
Prothrombin Time: 20.6 seconds — ABNORMAL HIGH (ref 11.6–15.2)

## 2013-05-29 LAB — CBC
HEMATOCRIT: 26.9 % — AB (ref 36.0–46.0)
HEMOGLOBIN: 9 g/dL — AB (ref 12.0–15.0)
MCH: 27.4 pg (ref 26.0–34.0)
MCHC: 33.5 g/dL (ref 30.0–36.0)
MCV: 82 fL (ref 78.0–100.0)
Platelets: 43 10*3/uL — ABNORMAL LOW (ref 150–400)
RBC: 3.28 MIL/uL — AB (ref 3.87–5.11)
RDW: 16.3 % — ABNORMAL HIGH (ref 11.5–15.5)
WBC: 2.6 10*3/uL — AB (ref 4.0–10.5)

## 2013-05-29 LAB — FERRITIN: FERRITIN: 36 ng/mL (ref 10–291)

## 2013-05-29 LAB — HEMOGLOBIN A1C
HEMOGLOBIN A1C: 6.7 % — AB (ref <5.7)
Mean Plasma Glucose: 146 mg/dL — ABNORMAL HIGH (ref <117)

## 2013-05-29 LAB — PATHOLOGIST SMEAR REVIEW

## 2013-05-29 MED ORDER — DEXTROSE 5 % IV SOLN
2.0000 g | INTRAVENOUS | Status: DC
  Administered 2013-05-29 – 2013-06-01 (×4): 2 g via INTRAVENOUS
  Filled 2013-05-29 (×6): qty 2

## 2013-05-29 NOTE — Progress Notes (Signed)
[facility] PROGRESS NOTE  [name] [id number] DOB: 1960-09-28 DOA: 05/28/2013 PCP: Glo Herring., MD  Assessment/Plan: Abdominal Pain -Presumed related to SBP. -States no improvement so far. -Definitely has a chronic component.  SBP -Cx data pending. -Had 4646 WBCs in ascitic fluid. -She has used rocephin in the past; will change imipenem to Rocephin as will allow for easier administration at home (QD vs TID dosing). -For repeat paracentesis in am. -Will need SBP prophylaxis. -Was given albumin yesterday for low BP. -Hold BB (due to increased morbidity and mortality).  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  Thrombocytopenia -2/2 cirrhosis. -43 4/22. -Follow.  Hyperglycemia -no h/o DM. -Check A1C.    Code Status: Full Code Family Communication: Myrlene Broker at bedside updated on plan of care.  Disposition Plan: Home when ready.   Consultants:  GI, Dr. Laural Golden   Antibiotics:  Imipenem 4/21-4/22  Rocephin 4/22   Subjective: Still with abdominal pain.  Objective: Filed Vitals:   05/28/13 1848 05/28/13 2259 05/29/13 0423 05/29/13 0812  BP: 104/40 94/48 113/62   Pulse: 90 77 94   Temp: 98.9 F (37.2 C) 98 F (36.7 C) 98.6 F (37 C)   TempSrc: Oral Oral Oral   Resp: 19 18 18    Height:      Weight:   71.6 kg (157 lb 13.6 oz) 65.7 kg (144 lb 13.5 oz)  SpO2: 98% 96% 95%     Intake/Output Summary (Last 24 hours) at 05/29/13 1321 Last data filed at 05/29/13 1258  Gross per 24 hour  Intake    600 ml  Output   2000 ml  Net  -1400 ml   Filed Weights   05/28/13 1551 05/29/13 0423 05/29/13 0812  Weight: 65.817 kg (145 lb 1.6 oz) 71.6 kg (157 lb 13.6 oz) 65.7 kg (144 lb 13.5 oz)    Exam:   General:  AA Ox3, NAD  Cardiovascular: RRR  Respiratory: CTA B  Abdomen: S/distended, +fluid wave  Extremities: no C/C/E   Neurologic:  Non-focal  Data Reviewed: Basic Metabolic  Panel:  Recent Labs Lab 05/28/13 0200 05/29/13 0612  NA 134* 138  K 3.9 3.8  CL 97 102  CO2 27 27  GLUCOSE 182* 171*  BUN 14 11  CREATININE 0.92 0.84  CALCIUM 8.4 8.2*   Liver Function Tests:  Recent Labs Lab 05/28/13 0200  AST 25  ALT 25  ALKPHOS 100  BILITOT 0.9  PROT 6.9  ALBUMIN 3.2*   No results found for this basename: LIPASE, AMYLASE,  in the last 168 hours No results found for this basename: AMMONIA,  in the last 168 hours CBC:  Recent Labs Lab 05/28/13 0200 05/29/13 0612  WBC 5.1 2.6*  NEUTROABS 4.0  --   HGB 9.2* 9.0*  HCT 28.5* 26.9*  MCV 82.4 82.0  PLT PLATELET CLUMPS NOTED ON SMEAR, COUNT APPEARS DECREASED 43*   Cardiac Enzymes: No results found for this basename: CKTOTAL, CKMB, CKMBINDEX, TROPONINI,  in the last 168 hours BNP (last 3 results) No results found for this basename: PROBNP,  in the last 8760 hours CBG: No results found for this basename: GLUCAP,  in the last 168 hours  Recent Results (from the past 240 hour(s))  BODY FLUID CULTURE     Status: None   Collection Time    05/28/13  9:50 AM      Result Value Ref Range Status   Specimen Description  ASCITIC FLUID   Final   Special Requests NONE   Final   Gram Stain     Final   Value: ABUNDANT WBC PRESENT,BOTH PMN AND MONONUCLEAR     NO ORGANISMS SEEN     Performed at [facility]   Culture     Final   Value: NO GROWTH 1 DAY     Performed at [facility]   Report Status PENDING   Incomplete     Studies: Ct Abdomen Pelvis W Contrast  05/28/2013   CLINICAL DATA:  Abdominal pain. History of liver failure. Gastric bypass.  EXAM: CT ABDOMEN AND PELVIS WITH CONTRAST  TECHNIQUE: Multidetector CT imaging of the abdomen and pelvis was performed using the standard protocol following bolus administration of intravenous contrast.  CONTRAST:  44mL OMNIPAQUE IOHEXOL 300 MG/ML SOLN, 182mL OMNIPAQUE IOHEXOL 300 MG/ML SOLN  COMPARISON:  US PARACENTESIS dated 01/18/2013; CT ABD -  PELV W/ CM dated 06/29/2012  FINDINGS: The lung bases are clear.  Changes of hepatic cirrhosis with marked splenic enlargement. Portal veins demonstrate evidence of cavernous transformation. Diffuse abdominal free fluid consistent with ascites. This has progressed since the previous study. The gallbladder is surgically absent. No bile duct dilatation. Pancreas and adrenal glands are normal. Multiple nonobstructing stones in the right kidney. No hydronephrosis in either kidney. Postoperative changes consistent with history of gastric bypass. Upper abdominal varices. Small bowel and colon are not abnormally distended. No free air. Surgical clips in the mesentery.  Pelvis: Ascites extends into the pelvis. Bladder wall is not thickened. No pelvic mass or lymphadenopathy. Appendix is normal. No diverticulitis. No destructive bone lesions. Mild degenerative changes in the spine.  IMPRESSION: Hepatic cirrhosis with portal venous hypertension and splenomegaly as well as varices. Interval increase of amount of ascites. Multiple nonobstructing right renal stones. Postoperative cholecystectomy and gastric bypass.   Electronically Signed   By: Lucienne Capers M.D.   On: 05/28/2013 04:57   US Paracentesis  05/28/2013   CLINICAL DATA:  Cirrhosis, ascites, abdominal pain  EXAM: ULTRASOUND GUIDED diagnostic and therapeutic PARACENTESIS  COMPARISON:  01/18/2013  PROCEDURE: An ultrasound guided paracentesis was thoroughly discussed with the patient and questions answered. The benefits, risks, alternatives and complications were also discussed. The patient understands and wishes to proceed with the procedure. Written consent was obtained. Timeout protocol followed.  Ultrasound was performed to localize and mark an adequate pocket of fluid in the right quadrant of the abdomen. The area was then prepped and draped in the normal sterile fashion. 1% Lidocaine was used for local anesthesia. Under ultrasound guidance a 5 [place] Yueh  catheter was introduced. Paracentesis was performed. The catheter was removed and a dressing applied.  Complications: None  FINDINGS: A total of approximately 3000 mL of amber colored fluid was removed. A fluid sample of 180 mL was sent for laboratory analysis.  IMPRESSION: Successful ultrasound guided paracentesis yielding 3000 mL of amber colored ascites.   Electronically Signed   By: Lavonia Dana M.D.   On: 05/28/2013 11:23    Scheduled Meds: . baclofen  10 mg Oral BID  . cyclobenzaprine  10 mg Oral QHS  . DULoxetine  20 mg Oral Daily  . ferrous sulfate  325 mg Oral BID WC  . imipenem-cilastatin  500 mg Intravenous 3 times per day  . mirtazapine  15 mg Oral QHS  . multivitamin with minerals  1 tablet Oral Daily  . pantoprazole  40 mg Oral Daily  . rifaximin  550  mg Oral BID  . traZODone  50 mg Oral QHS   Continuous Infusions: . sodium chloride 10 mL/hr at 05/28/13 2019    Principal Problem:   SBP (spontaneous bacterial peritonitis) Active Problems:   Cirrhosis of liver   Anemia   Thrombocytopenia   Hyperglycemia   Chronic hepatitis C   Abdominal pain, right upper quadrant    Time spent: 35 minutes. Greater than 50% of this time was spent in direct contact with the patient coordinating care.    [facility] Pager [phone]  If 7PM-7AM, please contact night-coverage at [email or web address], password [facility] 05/29/2013, 1:21 PM  LOS: 1 day

## 2013-05-29 NOTE — Consult Note (Signed)
Reason for Consult: SBP Referring Physician: Hospitalist services  [name] is an 53 y.o. female.  HPI: 53 yr old female who has decompensated liver disease. Hx of Hep C which was successfully treated 10 yrs ago. She presently is seeing Dr. Monica Martinez of [facility] for evaluation for liver transplant. She was admitted yesterday with fatigue and pain rt upper quadrant.  She had abdominal distention. She has been on Cipro $Remove'750mg'[id number]$  weekly.  She underwent and US guided paracentesis which revealed:   FINDINGS:  A total of approximately 3000 mL of amber colored fluid was removed.  A fluid sample of 180 mL was sent for laboratory analysis.  IMPRESSION: Patology smear: Comments: 04.22.15 ABUNDANT REACTIVE MESOTHELIAL CELLS AND NEUTROPHILS PRESENT. Performed at [facility] Fluid WBC CT 8295     Past Medical History  Diagnosis Date  . Esophageal varices   . Cirrhosis     non alcoholic  . Ascites   . Anemia   . Liver failure     "I'm on liver transplant list @ [facility]" (11/07/2012)  . Portal vein thrombosis   . Complication of anesthesia   . PONV (postoperative nausea and vomiting)   . Heart murmur   . Type II diabetes mellitus     "not since gastric bypass" (11/07/2012)  . Hepatitis C     Hx: of Hep "C" it was eradicated  . AOZHYQMV(784.6)     "monthly" (11/07/2012)  . Anxiety   . Depression   . Kidney stones     Past Surgical History  Procedure Laterality Date  . Abdominal hysterectomy  2003  . Upper gastrointestinal endoscopy    . Colonoscopy    . Gastric bypass  ~ 1995  . Cesarean section  1995  . Lithotripsy      "several times" (11/07/2012)  . Knee arthroscopy Right   . Colonoscopy  10/21/2011    Procedure: COLONOSCOPY;  Surgeon: Rogene Houston, MD;  Location: [facility];  Service: Endoscopy;  Laterality: N/A;  245   . Cystoscopy with stent placement  12/08/2011    Procedure: CYSTOSCOPY WITH STENT PLACEMENT;  Surgeon:  Marissa Nestle, MD;  Location: [facility] ORS;  Service: Urology;  Laterality: Right;  . Cystoscopy w/ retrogrades  12/08/2011    Procedure: CYSTOSCOPY WITH RETROGRADE PYELOGRAM;  Surgeon: Marissa Nestle, MD;  Location: [facility] ORS;  Service: Urology;  Laterality: Right;  . Dilation and curettage of uterus    . Orif distal radius fracture Right 11/07/2012  . Cholecystectomy  1987  . Hernia repair  04/14/11    "one in my bellybutton" (11/07/2012)  . Inguinal hernia repair Right     "maybe 2" (11/07/2012)  . Open reduction internal fixation (orif) distal radial fracture Right 11/07/2012    Procedure: OPEN REDUCTION INTERNAL FIXATION (ORIF) RIGHT DISTAL RADIAL FRACTURE;  Surgeon: [name], MD;  Location: [facility];  Service: Orthopedics;  Laterality: Right;    Family History  Problem Relation Age of Onset  . Dementia Mother   . Heart disease Father   . Liver disease Father   . Hypertension Father   . Diabetes Father   . Dementia Father   . Hypertension Brother     Social History:  reports that she quit smoking about 22 years ago. Her smoking use included Cigarettes. She has a 2.5 pack-year smoking history. She has never used smokeless tobacco. She reports that she does not drink alcohol or use illicit drugs.  Allergies:  Allergies  Allergen Reactions  . Ancef [Cefazolin Sodium] Other (See Comments)    "Blisters in mouth and vagina"    Medications: I have reviewed the patient's current medications.  Results for orders placed during the hospital encounter of 05/28/13 (from the past 48 hour(s))  CBC WITH DIFFERENTIAL     Status: Abnormal   Collection Time    05/28/13  2:00 AM      Result Value Ref Range   WBC 5.1  4.0 - 10.5 K/uL   RBC 3.46 (*) 3.87 - 5.11 MIL/uL   Hemoglobin 9.2 (*) 12.0 - 15.0 g/dL   HCT 28.5 (*) 36.0 - 46.0 %   MCV 82.4  78.0 - 100.0 fL   MCH 26.6  26.0 - 34.0 pg   MCHC 32.3  30.0 - 36.0 g/dL   RDW 15.9 (*) 11.5 - 15.5 %   Platelets    150 - 400 K/uL   Value:  PLATELET CLUMPS NOTED ON SMEAR, COUNT APPEARS DECREASED   Neutrophils Relative % 80 (*) 43 - 77 %   Lymphocytes Relative 11 (*) 12 - 46 %   Monocytes Relative 7  3 - 12 %   Eosinophils Relative 2  0 - 5 %   Basophils Relative 0  0 - 1 %   Neutro Abs 4.0  1.7 - 7.7 K/uL   Lymphs Abs 0.6 (*) 0.7 - 4.0 K/uL   Monocytes Absolute 0.4  0.1 - 1.0 K/uL   Eosinophils Absolute 0.1  0.0 - 0.7 K/uL   Basophils Absolute 0.0  0.0 - 0.1 K/uL   Smear Review       Value: PLATELET CLUMPS NOTED ON SMEAR, COUNT APPEARS DECREASED  COMPREHENSIVE METABOLIC PANEL     Status: Abnormal   Collection Time    05/28/13  2:00 AM      Result Value Ref Range   Sodium 134 (*) 137 - 147 mEq/L   Potassium 3.9  3.7 - 5.3 mEq/L   Chloride 97  96 - 112 mEq/L   CO2 27  19 - 32 mEq/L   Glucose, Bld 182 (*) 70 - 99 mg/dL   BUN 14  6 - 23 mg/dL   Creatinine, Ser 0.92  0.50 - 1.10 mg/dL   Calcium 8.4  8.4 - 10.5 mg/dL   Total Protein 6.9  6.0 - 8.3 g/dL   Albumin 3.2 (*) 3.5 - 5.2 g/dL   AST 25  0 - 37 U/L   ALT 25  0 - 35 U/L   Alkaline Phosphatase 100  39 - 117 U/L   Total Bilirubin 0.9  0.3 - 1.2 mg/dL   GFR calc non Af Amer 71 (*) >90 mL/min   GFR calc Af Amer 82 (*) >90 mL/min   Comment: (NOTE)     The eGFR has been calculated using the CKD EPI equation.     This calculation has not been validated in all clinical situations.     eGFR's persistently <90 mL/min signify possible Chronic Kidney     Disease.  URINALYSIS, ROUTINE W REFLEX MICROSCOPIC     Status: Abnormal   Collection Time    05/28/13  2:06 AM      Result Value Ref Range   Color, Urine YELLOW  YELLOW   APPearance CLEAR  CLEAR   Specific Gravity, Urine 1.020  1.005 - 1.030   pH 6.5  5.0 - 8.0   Glucose, UA NEGATIVE  NEGATIVE mg/dL   Hgb urine dipstick  NEGATIVE  NEGATIVE   Bilirubin Urine NEGATIVE  NEGATIVE   Ketones, ur NEGATIVE  NEGATIVE mg/dL   Protein, ur NEGATIVE  NEGATIVE mg/dL   Urobilinogen, UA 0.2  0.0 - 1.0 mg/dL   Nitrite NEGATIVE   NEGATIVE   Leukocytes, UA TRACE (*) NEGATIVE  URINE MICROSCOPIC-ADD ON     Status: Abnormal   Collection Time    05/28/13  2:06 AM      Result Value Ref Range   Squamous Epithelial / LPF MANY (*) RARE   WBC, UA 0-2  <3 WBC/hpf   RBC / HPF 0-2  <3 RBC/hpf   Bacteria, UA RARE  RARE  BODY FLUID CELL COUNT WITH DIFFERENTIAL     Status: Abnormal   Collection Time    05/28/13  9:50 AM      Result Value Ref Range   Fluid Type-FCT ASCITIC     Comment: FLUID     CORRECTED ON 04/21 AT 1013: PREVIOUSLY REPORTED AS Body Fluid   Color, Fluid YELLOW  YELLOW   Appearance, Fluid CLOUDY  CLEAR   WBC, Fluid 4646 (*) 0 - 1000 cu mm   Neutrophil Count, Fluid 87 (*) 0 - 25 %   Lymphs, Fluid 4     Monocyte-Macrophage-Serous Fluid 9 (*) 50 - 90 %   Eos, Fluid 0     Other Cells, Fluid FEW MESOTHELIAL CELLS     BODY FLUID CULTURE     Status: None   Collection Time    05/28/13  9:50 AM      Result Value Ref Range   Specimen Description ASCITIC FLUID     Special Requests NONE     Gram Stain       Value: ABUNDANT WBC PRESENT,BOTH PMN AND MONONUCLEAR     NO ORGANISMS SEEN     Performed at [facility]   Culture       Value: NO GROWTH 1 DAY     Performed at [facility]   Report Status PENDING    PATHOLOGIST SMEAR REVIEW     Status: None   Collection Time    05/28/13  9:50 AM      Result Value Ref Range   Path Review Reviewed By Violet Baldy, M.D.     Comment: 04.22.15     ABUNDANT REACTIVE MESOTHELIAL CELLS AND NEUTROPHILS PRESENT.     Performed at [facility] METABOLIC PANEL     Status: Abnormal   Collection Time    05/29/13  6:12 AM      Result Value Ref Range   Sodium 138  137 - 147 mEq/L   Potassium 3.8  3.7 - 5.3 mEq/L   Chloride 102  96 - 112 mEq/L   CO2 27  19 - 32 mEq/L   Glucose, Bld 171 (*) 70 - 99 mg/dL   BUN 11  6 - 23 mg/dL   Creatinine, Ser 0.84  0.50 - 1.10 mg/dL   Calcium 8.2 (*) 8.4 - 10.5 mg/dL   GFR calc non Af Amer 79 (*)  >90 mL/min   GFR calc Af Amer >90  >90 mL/min   Comment: (NOTE)     The eGFR has been calculated using the CKD EPI equation.     This calculation has not been validated in all clinical situations.     eGFR's persistently <90 mL/min signify possible Chronic Kidney     Disease.  CBC     Status: Abnormal  Collection Time    05/29/13  6:12 AM      Result Value Ref Range   WBC 2.6 (*) 4.0 - 10.5 K/uL   RBC 3.28 (*) 3.87 - 5.11 MIL/uL   Hemoglobin 9.0 (*) 12.0 - 15.0 g/dL   HCT 26.9 (*) 36.0 - 46.0 %   MCV 82.0  78.0 - 100.0 fL   MCH 27.4  26.0 - 34.0 pg   MCHC 33.5  30.0 - 36.0 g/dL   RDW 16.3 (*) 11.5 - 15.5 %   Platelets 43 (*) 150 - 400 K/uL   Comment: SPECIMEN CHECKED FOR CLOTS     PLATELET COUNT CONFIRMED BY SMEAR  PROTIME-INR     Status: Abnormal   Collection Time    05/29/13  6:12 AM      Result Value Ref Range   Prothrombin Time 20.6 (*) 11.6 - 15.2 seconds   INR 1.83 (*) 0.00 - 1.49    Ct Abdomen Pelvis W Contrast  05/28/2013   CLINICAL DATA:  Abdominal pain. History of liver failure. Gastric bypass.  EXAM: CT ABDOMEN AND PELVIS WITH CONTRAST  TECHNIQUE: Multidetector CT imaging of the abdomen and pelvis was performed using the standard protocol following bolus administration of intravenous contrast.  CONTRAST:  58mL OMNIPAQUE IOHEXOL 300 MG/ML SOLN, 153mL OMNIPAQUE IOHEXOL 300 MG/ML SOLN  COMPARISON:  US PARACENTESIS dated 01/18/2013; CT ABD - PELV W/ CM dated 06/29/2012  FINDINGS: The lung bases are clear.  Changes of hepatic cirrhosis with marked splenic enlargement. Portal veins demonstrate evidence of cavernous transformation. Diffuse abdominal free fluid consistent with ascites. This has progressed since the previous study. The gallbladder is surgically absent. No bile duct dilatation. Pancreas and adrenal glands are normal. Multiple nonobstructing stones in the right kidney. No hydronephrosis in either kidney. Postoperative changes consistent with history of gastric bypass.  Upper abdominal varices. Small bowel and colon are not abnormally distended. No free air. Surgical clips in the mesentery.  Pelvis: Ascites extends into the pelvis. Bladder wall is not thickened. No pelvic mass or lymphadenopathy. Appendix is normal. No diverticulitis. No destructive bone lesions. Mild degenerative changes in the spine.  IMPRESSION: Hepatic cirrhosis with portal venous hypertension and splenomegaly as well as varices. Interval increase of amount of ascites. Multiple nonobstructing right renal stones. Postoperative cholecystectomy and gastric bypass.   Electronically Signed   By: Lucienne Capers M.D.   On: 05/28/2013 04:57   US Paracentesis  05/28/2013   CLINICAL DATA:  Cirrhosis, ascites, abdominal pain  EXAM: ULTRASOUND GUIDED diagnostic and therapeutic PARACENTESIS  COMPARISON:  01/18/2013  PROCEDURE: An ultrasound guided paracentesis was thoroughly discussed with the patient and questions answered. The benefits, risks, alternatives and complications were also discussed. The patient understands and wishes to proceed with the procedure. Written consent was obtained. Timeout protocol followed.  Ultrasound was performed to localize and mark an adequate pocket of fluid in the right quadrant of the abdomen. The area was then prepped and draped in the normal sterile fashion. 1% Lidocaine was used for local anesthesia. Under ultrasound guidance a 5 [place] Yueh catheter was introduced. Paracentesis was performed. The catheter was removed and a dressing applied.  Complications: None  FINDINGS: A total of approximately 3000 mL of amber colored fluid was removed. A fluid sample of 180 mL was sent for laboratory analysis.  IMPRESSION: Successful ultrasound guided paracentesis yielding 3000 mL of amber colored ascites.   Electronically Signed   By: Lavonia Dana M.D.   On: 05/28/2013 11:23  ROS Blood pressure 113/62, pulse 94, temperature 98.6 F (37 C), temperature source Oral, resp. rate 18, height  $Remov'5\' 6"'MVSlAK$  (1.676 m), weight 144 lb 13.5 oz (65.7 kg), SpO2 95.00%. Physical Exam Alert and oriented. Skin warm and dry. Oral mucosa is moist.   . Sclera anicteric, conjunctivae is pink. Thyroid not enlarged. No cervical lymphadenopathy. Lungs clear. Heart regular rate and rhythm.  Abdomen is soft and istended.  Bowel sounds are positive. No hepatomegaly. No abdominal masses felt.Tenderness rt upper quadrant. . Abdomen is soft.  No edema to lower extremities.   Dr. Laural Golden in and talked with patient and examined patient.  Assessment/Plan:SBP. Presently covered with Primaxin. She says she does not feel any better. There has been no fever. Plan: Paracentesis tomorrow.   Rona Ravens Setzer 05/29/2013, 11:39 AM    GI attending note; Patient interviewed and examined. She is well known to me. She has advanced cirrhosis secondary to successfully treated hepatitis C and she has been waiting for liver transplant for number of years. She is being followed [facility] at [facility] and [facility]. She has developed SBP despite taking Cipro once a week. Ascitic fluid cultures remain negative. She will need repeat abdominal paracenteses to document response to therapy.

## 2013-05-29 NOTE — Care Management Note (Addendum)
    Page 1 of 1   05/31/2013     1:34:13 PM CARE MANAGEMENT NOTE 05/31/2013  Patient:  [name], [name]   Account Number:  [id number]  Date Initiated:  05/29/2013  Documentation initiated by:  Claretha Cooper  Subjective/Objective Assessment:   Pt lives at home with son. States she has been home on IV AB before but not sure which agency.     Action/Plan:   May need to DC with IV AB. If IV Rocephin, pt will not need a PICC line   Anticipated DC Date:  05/30/2013   Anticipated DC Plan:  [place]  CM consult      [facility] Choice  HOME HEALTH   Choice offered to / List presented to:  C-1 Patient        [place] arranged  HH-1 RN      [place].   Status of service:  Completed, signed off Medicare Important Message given?   (If response is "NO", the following Medicare IM given date fields will be blank) Date Medicare IM given:   Date Additional Medicare IM given:    Discharge Disposition:  [place]  Per UR Regulation:    If discussed at Long Length of Stay Meetings, dates discussed:    Comments:  05/31/13 Claretha Cooper RN CM pt would like HHRN at [facility]  05/29/13 Jamerion Cabello Dellia Nims RN BSN CM

## 2013-05-30 ENCOUNTER — Inpatient Hospital Stay (HOSPITAL_COMMUNITY): Payer: BC Managed Care – PPO

## 2013-05-30 ENCOUNTER — Encounter (HOSPITAL_COMMUNITY): Payer: Self-pay

## 2013-05-30 LAB — COMPREHENSIVE METABOLIC PANEL
ALT: 56 U/L — AB (ref 0–35)
AST: 46 U/L — ABNORMAL HIGH (ref 0–37)
Albumin: 2.8 g/dL — ABNORMAL LOW (ref 3.5–5.2)
Alkaline Phosphatase: 132 U/L — ABNORMAL HIGH (ref 39–117)
BUN: 12 mg/dL (ref 6–23)
CALCIUM: 8.2 mg/dL — AB (ref 8.4–10.5)
CO2: 28 mEq/L (ref 19–32)
CREATININE: 0.77 mg/dL (ref 0.50–1.10)
Chloride: 102 mEq/L (ref 96–112)
GFR calc Af Amer: >90 mL/min (ref >90)
GLUCOSE: 199 mg/dL — AB (ref 70–99)
Potassium: 3.6 mEq/L — ABNORMAL LOW (ref 3.7–5.3)
SODIUM: 138 meq/L (ref 137–147)
Total Bilirubin: 1.1 mg/dL (ref 0.3–1.2)
Total Protein: 6.4 g/dL (ref 6.0–8.3)

## 2013-05-30 LAB — CBC
HCT: 27.1 % — ABNORMAL LOW (ref 36.0–46.0)
HEMOGLOBIN: 9 g/dL — AB (ref 12.0–15.0)
MCH: 27.1 pg (ref 26.0–34.0)
MCHC: 33.2 g/dL (ref 30.0–36.0)
MCV: 81.6 fL (ref 78.0–100.0)
Platelets: 44 10*3/uL — ABNORMAL LOW (ref 150–400)
RBC: 3.32 MIL/uL — ABNORMAL LOW (ref 3.87–5.11)
RDW: 16 % — ABNORMAL HIGH (ref 11.5–15.5)
WBC: 2.5 10*3/uL — AB (ref 4.0–10.5)

## 2013-05-30 LAB — BODY FLUID CELL COUNT WITH DIFFERENTIAL
EOS FL: 0 %
Lymphs, Fluid: 11 %
Monocyte-Macrophage-Serous Fluid: 56 % (ref 50–90)
NEUTROPHIL FLUID: 33 % — AB (ref 0–25)
WBC FLUID: 553 uL (ref 0–1000)

## 2013-05-30 LAB — PROTEIN, BODY FLUID: Total protein, fluid: 1 g/dL

## 2013-05-30 LAB — GLUCOSE, SEROUS FLUID: Glucose, Fluid: 331 mg/dL

## 2013-05-30 MED ORDER — ALBUMIN HUMAN 25 % IV SOLN
25.0000 g | Freq: Once | INTRAVENOUS | Status: AC
  Administered 2013-05-30: 25 g via INTRAVENOUS
  Filled 2013-05-30: qty 100

## 2013-05-30 NOTE — Progress Notes (Signed)
Patient returned from paracentesis. Vital signs stable. Patient c/o pain. Morphine given as ordered.

## 2013-05-30 NOTE — Progress Notes (Signed)
Patient feels much better today. She is having less pain. She had another abdominal paracenteses with removal of over 2 L of fluid. Cell count reveals WBC of 553 compared to 46462 days ago. Cultures remained negative. She is responding to IV antibiotic and she will need portal 5 days.

## 2013-05-30 NOTE — Procedures (Signed)
PreOperative Dx: Cirrhosis, ascites, SBP Postoperative Dx: Cirrhosis, ascites, SBP Procedure:   US guided paracentesis Radiologist:  Thornton Papas Anesthesia:  10 ml of 1% lidocaine Specimen:  2200 ml of yellow ascitic fluid EBL:   < 1 ml Complications: None

## 2013-05-30 NOTE — Progress Notes (Signed)
Paracentesis complete no signs of distress. 2247ml amber colored abdominal fluid removed

## 2013-05-30 NOTE — Progress Notes (Signed)
Patient ID: [name], female   DOB: December 27, 1960, 53 y.o.   MRN: [id number] Feel some better. Still has some tenderness rt upper quadrant. She says her abdomen is more distended this am. Weight this am 144 in bed. Filed Vitals:   05/29/13 0812 05/29/13 1505 05/29/13 2050 05/30/13 0352  BP:  110/53 106/64 106/64  Pulse:  73 65 71  Temp:  98 F (36.7 C) 97.8 F (36.6 C) 98.2 F (36.8 C)  TempSrc:  Oral Oral Oral  Resp:  18 18 18   Height:      Weight: 144 lb 13.5 oz (65.7 kg)   153 lb 7 oz (69.6 kg)  SpO2:  95% 96% 94%  Alert. Lungs clear.Heart rate regular. [place] heard.  Abdomen distended, but soft. BS+. No BM since admission. Assessement Plan: Advanced cirrhosis secondary to successfully treated Hepatitis C.She is followed at [facility] and [facility]. SBP. She is scheduled  For an repeat abdominal paracentesis to document response to therapy.

## 2013-05-30 NOTE — Progress Notes (Signed)
[facility] PROGRESS NOTE  [name] [id number] DOB: [birth year] DOA: 05/28/2013 PCP: Glo Herring., MD  Assessment/Plan: Abdominal Pain -Presumed related to SBP. -Improved today. -Definitely has a chronic component.  SBP -Cx data pending. -Had 4646 WBCs in ascitic fluid. -On rocephin for a total of 5 days. -Had repeat paracentesis today:await fluid studies to document response to treatment. -Will need SBP prophylaxis. -Hold BB (due to increased morbidity and mortality).  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 -Follows with Dr. Laural Golden locally.  Thrombocytopenia -2/2 cirrhosis. -43 4/22; 44 on 4/23. -Follow.  Hyperglycemia -no h/o DM. -A1c 6.7    Code Status: Full Code Family Communication: Myrlene Broker at bedside updated on plan of care.  Disposition Plan: Home when ready.   Consultants:  GI, Dr. Laural Golden   Antibiotics:  Imipenem 4/21-4/22  Rocephin 4/22   Subjective: Still with abdominal pain, altho improved since yesterday.  Objective: Filed Vitals:   05/29/13 0812 05/29/13 1505 05/29/13 2050 05/30/13 0352  BP:  110/53 106/64 106/64  Pulse:  73 65 71  Temp:  98 F (36.7 C) 97.8 F (36.6 C) 98.2 F (36.8 C)  TempSrc:  Oral Oral Oral  Resp:  18 18 18   Height:      Weight: 65.7 kg (144 lb 13.5 oz)   69.6 kg (153 lb 7 oz)  SpO2:  95% 96% 94%    Intake/Output Summary (Last 24 hours) at 05/30/13 1313 Last data filed at 05/30/13 0900  Gross per 24 hour  Intake 1298.67 ml  Output    900 ml  Net 398.67 ml   Filed Weights   05/29/13 0423 05/29/13 0812 05/30/13 0352  Weight: 71.6 kg (157 lb 13.6 oz) 65.7 kg (144 lb 13.5 oz) 69.6 kg (153 lb 7 oz)    Exam:   General:  AA Ox3, NAD  Cardiovascular: RRR  Respiratory: CTA B  Abdomen: S/distended, +fluid wave  Extremities: no C/C/E   Neurologic:  Non-focal  Data Reviewed: Basic Metabolic Panel:  Recent Labs Lab 05/28/13 0200  05/29/13 0612 05/30/13 0548  NA 134* 138 138  K 3.9 3.8 3.6*  CL 97 102 102  CO2 27 27 28   GLUCOSE 182* 171* 199*  BUN 14 11 12   CREATININE 0.92 0.84 0.77  CALCIUM 8.4 8.2* 8.2*   Liver Function Tests:  Recent Labs Lab 05/28/13 0200 05/30/13 0548  AST 25 46*  ALT 25 56*  ALKPHOS 100 132*  BILITOT 0.9 1.1  PROT 6.9 6.4  ALBUMIN 3.2* 2.8*   No results found for this basename: LIPASE, AMYLASE,  in the last 168 hours No results found for this basename: AMMONIA,  in the last 168 hours CBC:  Recent Labs Lab 05/28/13 0200 05/29/13 0612 05/30/13 0548  WBC 5.1 2.6* 2.5*  NEUTROABS 4.0  --   --   HGB 9.2* 9.0* 9.0*  HCT 28.5* 26.9* 27.1*  MCV 82.4 82.0 81.6  PLT PLATELET CLUMPS NOTED ON SMEAR, COUNT APPEARS DECREASED 43* 44*   Cardiac Enzymes: No results found for this basename: CKTOTAL, CKMB, CKMBINDEX, TROPONINI,  in the last 168 hours BNP (last 3 results) No results found for this basename: PROBNP,  in the last 8760 hours CBG: No results found for this basename: GLUCAP,  in the last 168 hours  Recent Results (from the past 240 hour(s))  BODY FLUID CULTURE     Status: None   Collection Time    05/28/13  9:50 AM      Result Value Ref Range Status   Specimen Description ASCITIC FLUID   Final   Special Requests NONE   Final   Gram Stain     Final   Value: ABUNDANT WBC PRESENT,BOTH PMN AND MONONUCLEAR     NO ORGANISMS SEEN     Performed at [facility]   Culture     Final   Value: NO GROWTH 2 DAYS     Performed at [facility]   Report Status PENDING   Incomplete     Studies: No results found.  Scheduled Meds: . albumin human  25 g Intravenous Once  . baclofen  10 mg Oral BID  . cefTRIAXone (ROCEPHIN)  IV  2 g Intravenous Q24H  . cyclobenzaprine  10 mg Oral QHS  . DULoxetine  20 mg Oral Daily  . ferrous sulfate  325 mg Oral BID WC  . mirtazapine  15 mg Oral QHS  . multivitamin with minerals  1 tablet Oral Daily  . pantoprazole  40 mg  Oral Daily  . rifaximin  550 mg Oral BID  . traZODone  50 mg Oral QHS   Continuous Infusions: . sodium chloride 10 mL/hr at 05/28/13 2019    Principal Problem:   SBP (spontaneous bacterial peritonitis) Active Problems:   Cirrhosis of liver   Anemia   Thrombocytopenia   Hyperglycemia   Chronic hepatitis C   Abdominal pain, right upper quadrant    Time spent: 35 minutes. Greater than 50% of this time was spent in direct contact with the patient coordinating care.    [place] [facility] Pager [phone]  If 7PM-7AM, please contact night-coverage at [email or web address], password [facility] 05/30/2013, 1:13 PM  LOS: 2 days

## 2013-05-31 DIAGNOSIS — K652 Spontaneous bacterial peritonitis: Secondary | ICD-10-CM

## 2013-05-31 DIAGNOSIS — Z7682 Awaiting organ transplant status: Secondary | ICD-10-CM

## 2013-05-31 DIAGNOSIS — R1011 Right upper quadrant pain: Secondary | ICD-10-CM

## 2013-05-31 DIAGNOSIS — K7460 Unspecified cirrhosis of liver: Secondary | ICD-10-CM

## 2013-05-31 DIAGNOSIS — R188 Other ascites: Secondary | ICD-10-CM

## 2013-05-31 LAB — COMPREHENSIVE METABOLIC PANEL
ALBUMIN: 2.7 g/dL — AB (ref 3.5–5.2)
ALK PHOS: 140 U/L — AB (ref 39–117)
ALT: 47 U/L — ABNORMAL HIGH (ref 0–35)
AST: 36 U/L (ref 0–37)
BUN: 10 mg/dL (ref 6–23)
CO2: 26 mEq/L (ref 19–32)
Calcium: 8 mg/dL — ABNORMAL LOW (ref 8.4–10.5)
Chloride: 96 mEq/L (ref 96–112)
Creatinine, Ser: 0.72 mg/dL (ref 0.50–1.10)
GFR calc Af Amer: >90 mL/min (ref >90)
GFR calc non Af Amer: >90 mL/min (ref >90)
Glucose, Bld: 449 mg/dL — ABNORMAL HIGH (ref 70–99)
POTASSIUM: 3.6 meq/L — AB (ref 3.7–5.3)
SODIUM: 131 meq/L — AB (ref 137–147)
Total Bilirubin: 0.7 mg/dL (ref 0.3–1.2)
Total Protein: 6.2 g/dL (ref 6.0–8.3)

## 2013-05-31 LAB — BODY FLUID CULTURE: CULTURE: NO GROWTH

## 2013-05-31 LAB — AMMONIA: AMMONIA: 75 umol/L — AB (ref 11–60)

## 2013-05-31 LAB — PROTIME-INR
INR: 1.49 (ref 0.00–1.49)
PROTHROMBIN TIME: 17.6 s — AB (ref 11.6–15.2)

## 2013-05-31 LAB — PATHOLOGIST SMEAR REVIEW

## 2013-05-31 MED ORDER — LACTULOSE 10 GM/15ML PO SOLN
20.0000 g | Freq: Two times a day (BID) | ORAL | Status: DC
  Administered 2013-05-31 – 2013-06-01 (×3): 20 g via ORAL
  Filled 2013-05-31 (×3): qty 30

## 2013-05-31 NOTE — Progress Notes (Addendum)
Subjective; Patient reports less abdominal pain than she had yesterday. Her appetite is getting better. She ate most of her breakfast. She has noted that she was shaky when she was holding the telephone. She hasn't had a bowel movement in 4 days.  Objective; BP 139/53  Pulse 73  Temp(Src) 98 F (36.7 C) (Oral)  Resp 20  Ht 5\' 6"  (1.676 m)  Wt 154 lb 15.7 oz (70.3 kg)  BMI 25.03 kg/m2  SpO2 100% Patient is alert and in no acute distress. She has slight tremor to her left hand but no asterixis noted. Heart exam with regular rhythm normal S1 and S2. No murmur noted. Lungs are clear to auscultation. Abdomen is full with bulging flanks. Dressing over puncture site from yesterday and right lateral abdomen is dry. Abdomen is soft with mild tenderness across upper abdomen but more so in the right upper quadrant. large easily palpable spleen. Straight is a thin wasted but no edema noted.  Lab data; Ascitic fluid cultures from 05/28/2013 and 05/30/2013 remain negative.  Assessment; #1. Spontaneous bacterial peritonitis(culture negative neutrocytic ascites). She is responding to antibiotic therapy as evidenced by decreasing abdominal pain and significant drop in ascitic fluid white cell count. She will need portal a 5 days of IV antibiotics. Discussed patient's condition with Dr. Monica Martinez of [facility] who asked to recalculate her meld score. He also recommended she go home either in Bactrim DS 5 days each week with third generation cephalosporin. #2. Decompensated cirrhosis secondary to successfully treated hepatitis C. Patient is waiting for liver transplant. #3. Tremors to left hand without obvious asterixis. She could have mild hepatic encephalopathy despite Xifaxan.  Recommendations;  Will check, INR and ammonia today and recalculate  MELD score. Will fax labs to Dr. Monica Martinez later today. Lactulose grams by mouth twice a day. She should be able to go home after IV antibiotic dose  tomorrow afternoon.  Addendum; Will need Bactrim DS one tablet daily Monday through Friday each week.

## 2013-05-31 NOTE — Progress Notes (Signed)
[facility] PROGRESS NOTE  [name] [id number] DOB: June 01, 1960 DOA: 05/28/2013 PCP: Glo Herring., MD  Assessment/Plan: Abdominal Pain -Presumed related to SBP. -Improved today. -Definitely has a chronic component.  SBP -Cx data pending. -Had 4646 WBCs in ascitic fluid on initial paracentesis, down to 553 on repeat paracentesis. -On rocephin for a total of 5 days. -Will need SBP prophylaxis on DC. GI recs bactrim. -Hold BB (due to increased morbidity and mortality).  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 -Follows with Dr. Laural Golden locally. -Ammonia increasing. -Lactulose has been restarted.  Thrombocytopenia -2/2 cirrhosis. -43 4/22; 44 on 4/23. -Follow.  Hyperglycemia -no h/o DM. -A1c 6.7    Code Status: Full Code Family Communication: Patient only. Disposition Plan: Home when ready.   Consultants:  GI, Dr. Laural Golden   Antibiotics:  Imipenem 4/21-4/22  Rocephin 4/22   Subjective: Still with abdominal pain, altho improved since yesterday.  Objective: Filed Vitals:   05/30/13 2300 05/31/13 0458 05/31/13 0558 05/31/13 1456  BP: 104/48 114/49 139/53 111/60  Pulse: 80 75 73 72  Temp: 97.9 F (36.6 C) 97.9 F (36.6 C) 98 F (36.7 C) 98.1 F (36.7 C)  TempSrc: Oral Oral Oral Oral  Resp: 20 19 20 20   Height:      Weight:   70.3 kg (154 lb 15.7 oz)   SpO2: 97% 94% 100% 97%    Intake/Output Summary (Last 24 hours) at 05/31/13 1645 Last data filed at 05/31/13 1200  Gross per 24 hour  Intake    240 ml  Output   1000 ml  Net   -760 ml   Filed Weights   05/30/13 0352 05/30/13 1521 05/31/13 0558  Weight: 69.6 kg (153 lb 7 oz) 70.1 kg (154 lb 8.7 oz) 70.3 kg (154 lb 15.7 oz)    Exam:   General:  AA Ox3, NAD  Cardiovascular: RRR  Respiratory: CTA B  Abdomen: S/distended, +fluid wave  Extremities: no C/C/E   Neurologic:  Non-focal  Data Reviewed: Basic Metabolic Panel:  Recent  Labs Lab 05/28/13 0200 05/29/13 0612 05/30/13 0548 05/31/13 1045  NA 134* 138 138 131*  K 3.9 3.8 3.6* 3.6*  CL 97 102 102 96  CO2 27 27 28 26   GLUCOSE 182* 171* 199* 449*  BUN 14 11 12 10   CREATININE 0.92 0.84 0.77 0.72  CALCIUM 8.4 8.2* 8.2* 8.0*   Liver Function Tests:  Recent Labs Lab 05/28/13 0200 05/30/13 0548 05/31/13 1045  AST 25 46* 36  ALT 25 56* 47*  ALKPHOS 100 132* 140*  BILITOT 0.9 1.1 0.7  PROT 6.9 6.4 6.2  ALBUMIN 3.2* 2.8* 2.7*   No results found for this basename: LIPASE, AMYLASE,  in the last 168 hours  Recent Labs Lab 05/31/13 1025  AMMONIA 75*   CBC:  Recent Labs Lab 05/28/13 0200 05/29/13 0612 05/30/13 0548  WBC 5.1 2.6* 2.5*  NEUTROABS 4.0  --   --   HGB 9.2* 9.0* 9.0*  HCT 28.5* 26.9* 27.1*  MCV 82.4 82.0 81.6  PLT PLATELET CLUMPS NOTED ON SMEAR, COUNT APPEARS DECREASED 43* 44*   Cardiac Enzymes: No results found for this basename: CKTOTAL, CKMB, CKMBINDEX, TROPONINI,  in the last 168 hours BNP (last 3 results) No results found for this basename: PROBNP,  in the last 8760 hours CBG: No results found for this basename: GLUCAP,  in the last 168 hours  Recent Results (from the past 240  hour(s))  BODY FLUID CULTURE     Status: None   Collection Time    05/28/13  9:50 AM      Result Value Ref Range Status   Specimen Description ASCITIC FLUID   Final   Special Requests NONE   Final   Gram Stain     Final   Value: ABUNDANT WBC PRESENT,BOTH PMN AND MONONUCLEAR     NO ORGANISMS SEEN     Performed at [facility]   Culture     Final   Value: NO GROWTH 3 DAYS     Performed at [facility]   Report Status 05/31/2013 FINAL   Final  BODY FLUID CULTURE     Status: None   Collection Time    05/30/13 12:15 PM      Result Value Ref Range Status   Specimen Description ASCITIC FLUID   Final   Special Requests NONE   Final   Gram Stain     Final   Value: RARE WBC PRESENT, PREDOMINANTLY PMN     NO ORGANISMS SEEN      Performed at [facility]   Culture     Final   Value: NO GROWTH 1 DAY     Performed at [facility]   Report Status PENDING   Incomplete     Studies: US Paracentesis  05/30/2013   CLINICAL DATA:  Cirrhosis, ascites, spontaneous bacterial peritonitis  EXAM: ULTRASOUND GUIDED DIAGNOSTIC AND THERAPEUTIC PARACENTESIS  COMPARISON:  05/28/2013  PROCEDURE: An ultrasound guided paracentesis was thoroughly discussed with the patient and questions answered. The benefits, risks, alternatives and complications were also discussed. The patient understands and wishes to proceed with the procedure. Written consent was obtained.  Ultrasound was performed to localize and mark an adequate pocket of fluid in the right quadrant of the abdomen. The area was then prepped and draped in the normal sterile fashion. 1% Lidocaine was used for local anesthesia. Under ultrasound guidance a 5 Fr Yueh catheter was introduced. Paracentesis was performed. The catheter was removed and a dressing applied.  Complications: None  FINDINGS: A total of approximately 2200 ml of yellow fluid was removed. A fluid sample of 180 ml was sent for requested laboratory analysis.  IMPRESSION: Successful ultrasound guided paracentesis yielding 2200 ml of ascites.   Electronically Signed   By: Lavonia Dana M.D.   On: 05/30/2013 17:40    Scheduled Meds: . baclofen  10 mg Oral BID  . cefTRIAXone (ROCEPHIN)  IV  2 g Intravenous Q24H  . cyclobenzaprine  10 mg Oral QHS  . DULoxetine  20 mg Oral Daily  . ferrous sulfate  325 mg Oral BID WC  . lactulose  20 g Oral BID  . mirtazapine  15 mg Oral QHS  . multivitamin with minerals  1 tablet Oral Daily  . pantoprazole  40 mg Oral Daily  . rifaximin  550 mg Oral BID  . traZODone  50 mg Oral QHS   Continuous Infusions: . sodium chloride 10 mL/hr at 05/28/13 2019    Principal Problem:   SBP (spontaneous bacterial peritonitis) Active Problems:   Cirrhosis of liver   Anemia    Thrombocytopenia   Hyperglycemia   Chronic hepatitis C   Abdominal pain, right upper quadrant    Time spent: 35 minutes. Greater than 50% of this time was spent in direct contact with the patient coordinating care.    [place] [facility] Pager [phone]  If 7PM-7AM, please contact night-coverage at [email or web address], password [facility] 05/31/2013, 4:45 PM  LOS: 3 days

## 2013-06-01 LAB — CBC
HEMATOCRIT: 26.1 % — AB (ref 36.0–46.0)
Hemoglobin: 8.8 g/dL — ABNORMAL LOW (ref 12.0–15.0)
MCH: 27.2 pg (ref 26.0–34.0)
MCHC: 33.7 g/dL (ref 30.0–36.0)
MCV: 80.8 fL (ref 78.0–100.0)
Platelets: 50 10*3/uL — ABNORMAL LOW (ref 150–400)
RBC: 3.23 MIL/uL — ABNORMAL LOW (ref 3.87–5.11)
RDW: 16.3 % — AB (ref 11.5–15.5)
WBC: 2.8 10*3/uL — AB (ref 4.0–10.5)

## 2013-06-01 LAB — AMMONIA: Ammonia: 121 umol/L — ABNORMAL HIGH (ref 11–60)

## 2013-06-01 MED ORDER — PANTOPRAZOLE SODIUM 40 MG PO TBEC
40.0000 mg | DELAYED_RELEASE_TABLET | Freq: Every day | ORAL | Status: DC
  Administered 2013-06-02 – 2013-06-03 (×2): 40 mg via ORAL
  Filled 2013-06-01 (×2): qty 1

## 2013-06-01 MED ORDER — LACTULOSE 10 GM/15ML PO SOLN
30.0000 g | Freq: Three times a day (TID) | ORAL | Status: DC
  Administered 2013-06-01 – 2013-06-03 (×6): 30 g via ORAL
  Filled 2013-06-01 (×6): qty 60

## 2013-06-01 MED ORDER — SULFAMETHOXAZOLE-TMP DS 800-160 MG PO TABS
1.0000 | ORAL_TABLET | Freq: Every day | ORAL | Status: DC
  Administered 2013-06-01 – 2013-06-03 (×3): 1 via ORAL
  Filled 2013-06-01 (×3): qty 1

## 2013-06-01 NOTE — Progress Notes (Signed)
[facility] PROGRESS NOTE  [name] [id number] DOB: 05/31/1960 DOA: 05/28/2013 PCP: Glo Herring., MD  Assessment/Plan: Abdominal Pain -Presumed related to SBP. -Improved today. -Definitely has a chronic component.  SBP -Cx data pending. -Had 4646 WBCs in ascitic fluid on initial paracentesis, down to 553 on repeat paracentesis. -On rocephin for a total of 5 days. Today is day 5/5. -Will start on prophylactic bactrim in am as per Dr. Olevia Perches recommendations. -Hold BB (due to increased morbidity and mortality).  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 -Follows with Dr. Laural Golden locally. -Ammonia increasing despite lactulose; feels a little confused today..? Early hepatic encephalopathy. -Increase lactulose dose and recheck ammonia level in am.  Thrombocytopenia -2/2 cirrhosis. -43 4/22; 44 on 4/23. -Follow.  Hyperglycemia -no h/o DM. -A1c 6.7    Code Status: Full Code Family Communication: Significant other at bedside updated on plan of care. Disposition Plan: Home when ready.   Consultants:  GI, Dr. Laural Golden   Antibiotics:  Imipenem 4/21-4/22  Rocephin 4/22   Subjective: Still with abdominal pain, altho improved since yesterday.  Objective: Filed Vitals:   05/31/13 1456 05/31/13 2151 06/01/13 0606 06/01/13 1426  BP: 111/60 109/54 100/58 102/43  Pulse: 72 71 82 69  Temp: 98.1 F (36.7 C) 98.1 F (36.7 C) 98.1 F (36.7 C) 97.8 F (36.6 C)  TempSrc: Oral Oral Oral Oral  Resp: 20 20 20 20   Height:      Weight:   72.8 kg (160 lb 7.9 oz)   SpO2: 97% 95% 95% 97%    Intake/Output Summary (Last 24 hours) at 06/01/13 1547 Last data filed at 06/01/13 1300  Gross per 24 hour  Intake    575 ml  Output   1100 ml  Net   -525 ml   Filed Weights   05/30/13 1521 05/31/13 0558 06/01/13 0606  Weight: 70.1 kg (154 lb 8.7 oz) 70.3 kg (154 lb 15.7 oz) 72.8 kg (160 lb 7.9 oz)    Exam:   General:  AA  Ox3, NAD  Cardiovascular: RRR  Respiratory: CTA B  Abdomen: S/distended, +fluid wave  Extremities: no C/C/E   Neurologic:  Non-focal  Data Reviewed: Basic Metabolic Panel:  Recent Labs Lab 05/28/13 0200 05/29/13 0612 05/30/13 0548 05/31/13 1045  NA 134* 138 138 131*  K 3.9 3.8 3.6* 3.6*  CL 97 102 102 96  CO2 27 27 28 26   GLUCOSE 182* 171* 199* 449*  BUN 14 11 12 10   CREATININE 0.92 0.84 0.77 0.72  CALCIUM 8.4 8.2* 8.2* 8.0*   Liver Function Tests:  Recent Labs Lab 05/28/13 0200 05/30/13 0548 05/31/13 1045  AST 25 46* 36  ALT 25 56* 47*  ALKPHOS 100 132* 140*  BILITOT 0.9 1.1 0.7  PROT 6.9 6.4 6.2  ALBUMIN 3.2* 2.8* 2.7*   No results found for this basename: LIPASE, AMYLASE,  in the last 168 hours  Recent Labs Lab 05/31/13 1025 06/01/13 0626  AMMONIA 75* 121*   CBC:  Recent Labs Lab 05/28/13 0200 05/29/13 0612 05/30/13 0548 06/01/13 0626  WBC 5.1 2.6* 2.5* 2.8*  NEUTROABS 4.0  --   --   --   HGB 9.2* 9.0* 9.0* 8.8*  HCT 28.5* 26.9* 27.1* 26.1*  MCV 82.4 82.0 81.6 80.8  PLT PLATELET CLUMPS NOTED ON SMEAR, COUNT APPEARS DECREASED 43* 44* 50*   Cardiac Enzymes: No results found for this basename: CKTOTAL, CKMB, CKMBINDEX, TROPONINI,  in  the last 168 hours BNP (last 3 results) No results found for this basename: PROBNP,  in the last 8760 hours CBG: No results found for this basename: GLUCAP,  in the last 168 hours  Recent Results (from the past 240 hour(s))  BODY FLUID CULTURE     Status: None   Collection Time    05/28/13  9:50 AM      Result Value Ref Range Status   Specimen Description ASCITIC FLUID   Final   Special Requests NONE   Final   Gram Stain     Final   Value: ABUNDANT WBC PRESENT,BOTH PMN AND MONONUCLEAR     NO ORGANISMS SEEN     Performed at [facility]   Culture     Final   Value: NO GROWTH 3 DAYS     Performed at [facility]   Report Status 05/31/2013 FINAL   Final  BODY FLUID CULTURE     Status:  None   Collection Time    05/30/13 12:15 PM      Result Value Ref Range Status   Specimen Description ASCITIC FLUID   Final   Special Requests NONE   Final   Gram Stain     Final   Value: RARE WBC PRESENT, PREDOMINANTLY PMN     NO ORGANISMS SEEN     Performed at [facility]   Culture     Final   Value: NO GROWTH 2 DAYS     Performed at [facility]   Report Status PENDING   Incomplete     Studies: No results found.  Scheduled Meds: . baclofen  10 mg Oral BID  . cefTRIAXone (ROCEPHIN)  IV  2 g Intravenous Q24H  . cyclobenzaprine  10 mg Oral QHS  . DULoxetine  20 mg Oral Daily  . lactulose  20 g Oral BID  . mirtazapine  15 mg Oral QHS  . multivitamin with minerals  1 tablet Oral Daily  . [START ON 06/02/2013] pantoprazole  40 mg Oral QAC breakfast  . rifaximin  550 mg Oral BID  . traZODone  50 mg Oral QHS   Continuous Infusions: . sodium chloride 20 mL/hr (05/31/13 2053)    Principal Problem:   SBP (spontaneous bacterial peritonitis) Active Problems:   Cirrhosis of liver   Anemia   Thrombocytopenia   Hyperglycemia   Chronic hepatitis C   Abdominal pain, right upper quadrant    Time spent: 35 minutes. Greater than 50% of this time was spent in direct contact with the patient coordinating care.    [place] [facility] Pager [phone]  If 7PM-7AM, please contact night-coverage at [email or web address], password [facility] 06/01/2013, 3:47 PM  LOS: 4 days

## 2013-06-01 NOTE — Progress Notes (Addendum)
Subjective: PT ADMITTED WITH SBP. SX IMPROVED. ON DAY 5/7 ROCEPHIN. NAUSEA IMPROVED. ABD PAIN IMPROVED. CONTINUES WITH SWELLING IN ABDOMEN. NO SWELLING IN LEGS. WEIGHT UP 6 LBS SINCE NOV 2014.  Objective: Vital signs in last 24 hours: Temp:  [98.1 F (36.7 C)] 98.1 F (36.7 C) (04/25 0606) Pulse Rate:  [71-82] 82 (04/25 0606) Resp:  [20] 20 (04/25 0606) BP: (100-111)/(54-60) 100/58 mmHg (04/25 0606) SpO2:  [95 %-97 %] 95 % (04/25 0606) Weight:  [160 lb 7.9 oz (72.8 kg)] 160 lb 7.9 oz (72.8 kg) (04/25 0606) Last BM Date: 05/27/13  Intake/Output from previous day: 04/24 0701 - 04/25 0700 In: 575 [P.O.:360; I.V.:165; IV Piggyback:50] Out: 400 [Urine:400] Intake/Output this shift: Total I/O In: 120 [P.O.:120] Out: -   General appearance: alert, cooperative and no distress Resp: clear to auscultation bilaterally Cardio: regular rate and rhythm GI: soft, non-tender; bowel sounds normal; DISTENDED  Lab Results:  Recent Labs  05/30/13 0548 06/01/13 0626  WBC 2.5* 2.8*  HGB 9.0* 8.8*  HCT 27.1* 26.1*  PLT 44* 50*   BMET  Recent Labs  05/30/13 0548 05/31/13 1045  NA 138 131*  K 3.6* 3.6*  CL 102 96  CO2 28 26  GLUCOSE 199* 449*  BUN 12 10  CREATININE 0.77 0.72  CALCIUM 8.2* 8.0*   LFT  Recent Labs  05/31/13 1045  PROT 6.2  ALBUMIN 2.7*  AST 36  ALT 47*  ALKPHOS 140*  BILITOT 0.7   PT/INR  Recent Labs  05/31/13 1025  LABPROT 17.6*  INR 1.49   Hepatitis Panel No results found for this basename: HEPBSAG, HCVAB, HEPAIGM, HEPBIGM,  in the last 72 hours C-Diff No results found for this basename: CDIFFTOX,  in the last 72 hours Fecal Lactopherrin No results found for this basename: FECLLACTOFRN,  in the last 72 hours  Studies/Results: US Paracentesis  05/30/2013   CLINICAL DATA:  Cirrhosis, ascites, spontaneous bacterial peritonitis  EXAM: ULTRASOUND GUIDED DIAGNOSTIC AND THERAPEUTIC PARACENTESIS  COMPARISON:  05/28/2013  PROCEDURE: An ultrasound  guided paracentesis was thoroughly discussed with the patient and questions answered. The benefits, risks, alternatives and complications were also discussed. The patient understands and wishes to proceed with the procedure. Written consent was obtained.  Ultrasound was performed to localize and mark an adequate pocket of fluid in the right quadrant of the abdomen. The area was then prepped and draped in the normal sterile fashion. 1% Lidocaine was used for local anesthesia. Under ultrasound guidance a 5 Fr Yueh catheter was introduced. Paracentesis was performed. The catheter was removed and a dressing applied.  Complications: None  FINDINGS: A total of approximately 2200 ml of yellow fluid was removed. A fluid sample of 180 ml was sent for requested laboratory analysis.  IMPRESSION: Successful ultrasound guided paracentesis yielding 2200 ml of ascites.   Electronically Signed   By: [name] M.D.   On: 05/30/2013 17:40    Medications: I have reviewed the patient's current medications.  Assessment/Plan: ADMITTED WITH SPONTANEOUS BACTERIAL PERITONITIS. CLINICALLY  IMPROVED.  PLAN: 1. CONTINUE LOW SALT DET. FLUID RESTRICT. 2. COMPLETE 7 DAYS ROCEPHIN. WILL NEED BACTRIM DS DAILY FOR 5 DAYS A WEEK AS AN OUTPATIENT. 3. CONSIDER PARACENTESIS PRIOR TO DISCHARGE AND THE COMPLETE DRAINAGE AS AN OUTPATIENT 4. CONTINUE XIFAXAN AND LACTULOSE.    LOS: 4 days   [name] [name] 06/01/2013, 11:54 AM

## 2013-06-02 DIAGNOSIS — D649 Anemia, unspecified: Secondary | ICD-10-CM

## 2013-06-02 LAB — BODY FLUID CULTURE: CULTURE: NO GROWTH

## 2013-06-02 LAB — COMPREHENSIVE METABOLIC PANEL
ALK PHOS: 175 U/L — AB (ref 39–117)
ALT: 51 U/L — ABNORMAL HIGH (ref 0–35)
AST: 44 U/L — ABNORMAL HIGH (ref 0–37)
Albumin: 2.9 g/dL — ABNORMAL LOW (ref 3.5–5.2)
BUN: 9 mg/dL (ref 6–23)
CO2: 26 mEq/L (ref 19–32)
Calcium: 8.2 mg/dL — ABNORMAL LOW (ref 8.4–10.5)
Chloride: 97 mEq/L (ref 96–112)
Creatinine, Ser: 0.81 mg/dL (ref 0.50–1.10)
GFR calc Af Amer: >90 mL/min (ref >90)
GFR, EST NON AFRICAN AMERICAN: 83 mL/min — AB (ref >90)
GLUCOSE: 282 mg/dL — AB (ref 70–99)
POTASSIUM: 3.8 meq/L (ref 3.7–5.3)
Sodium: 133 mEq/L — ABNORMAL LOW (ref 137–147)
TOTAL PROTEIN: 6.6 g/dL (ref 6.0–8.3)
Total Bilirubin: 0.7 mg/dL (ref 0.3–1.2)

## 2013-06-02 LAB — CBC
HEMATOCRIT: 27.6 % — AB (ref 36.0–46.0)
Hemoglobin: 9 g/dL — ABNORMAL LOW (ref 12.0–15.0)
MCH: 26.4 pg (ref 26.0–34.0)
MCHC: 32.6 g/dL (ref 30.0–36.0)
MCV: 80.9 fL (ref 78.0–100.0)
PLATELETS: 53 10*3/uL — AB (ref 150–400)
RBC: 3.41 MIL/uL — ABNORMAL LOW (ref 3.87–5.11)
RDW: 16.4 % — AB (ref 11.5–15.5)
WBC: 3.1 10*3/uL — AB (ref 4.0–10.5)

## 2013-06-02 LAB — AMMONIA: Ammonia: 51 umol/L (ref 11–60)

## 2013-06-02 MED ORDER — SPIRONOLACTONE 100 MG PO TABS
300.0000 mg | ORAL_TABLET | Freq: Every day | ORAL | Status: DC
  Administered 2013-06-02 – 2013-06-03 (×2): 300 mg via ORAL
  Filled 2013-06-02 (×4): qty 3

## 2013-06-02 MED ORDER — FUROSEMIDE 80 MG PO TABS
80.0000 mg | ORAL_TABLET | Freq: Every day | ORAL | Status: DC
  Administered 2013-06-02 – 2013-06-03 (×2): 80 mg via ORAL
  Filled 2013-06-02 (×2): qty 1

## 2013-06-02 NOTE — Progress Notes (Signed)
[facility] PROGRESS NOTE  [name] [id number] DOB: [birth year] DOA: 05/28/2013 PCP: Glo Herring., MD  Assessment/Plan: Abdominal Pain -Presumed related to SBP. -Definitely has a chronic component.  SBP -Cx without growth thus far -Had 4646 WBCs in ascitic fluid on initial paracentesis, down to 553 on repeat paracentesis. -Pt completed rocephin for a total of 5 days. -Started on prophylactic bactrim in am as per Dr. Olevia Perches recommendations. -Holding BB (due to increased morbidity and mortality). 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 -Follows with Dr. Laural Golden locally. -Ammonia increasing despite lactulose. Conversant and appropriate today  Thrombocytopenia -2/2 cirrhosis. -43 4/22; 44 on 4/23. -Follow.  Hyperglycemia -no h/o DM. -A1c 6.7  Code Status: Full Code Family Communication: Pt and family in room Disposition Plan: Home when ready.  Consultants:  GI, Dr. Laural Golden   Antibiotics:  Imipenem 4/21-4/22  Rocephin 4/22>>>4/25  Subjective: Reports increasing abd girth and pain. Denies sob  Objective: Filed Vitals:   06/01/13 0606 06/01/13 1426 06/01/13 2232 06/02/13 0605  BP: 100/58 102/43 121/45 123/57  Pulse: 82 69 81 72  Temp: 98.1 F (36.7 C) 97.8 F (36.6 C) 98 F (36.7 C) 97.9 F (36.6 C)  TempSrc: Oral Oral Oral Oral  Resp: 20 20 20 20   Height:      Weight: 72.8 kg (160 lb 7.9 oz)   73.5 kg (162 lb 0.6 oz)  SpO2: 95% 97% 99% 98%    Intake/Output Summary (Last 24 hours) at 06/02/13 0950 Last data filed at 06/02/13 0650  Gross per 24 hour  Intake    720 ml  Output   1350 ml  Net   -630 ml   Filed Weights   05/31/13 0558 06/01/13 0606 06/02/13 0605  Weight: 70.3 kg (154 lb 15.7 oz) 72.8 kg (160 lb 7.9 oz) 73.5 kg (162 lb 0.6 oz)     Exam:   General:  AA Ox3, NAD  Cardiovascular: RRR  Respiratory: CTA B  Abdomen: S/distended, +fluid wave  Extremities: no C/C/E   Neurologic:  Non-focal  Data Reviewed: Basic Metabolic Panel:  Recent Labs Lab 05/28/13 0200 05/29/13 0612 05/30/13 0548 05/31/13 1045 06/02/13 0654  NA 134* 138 138 131* 133*  K 3.9 3.8 3.6* 3.6* 3.8  CL 97 102 102 96 97  CO2 27 27 28 26 26   GLUCOSE 182* 171* 199* 449* 282*  BUN 14 11 12 10 9   CREATININE 0.92 0.84 0.77 0.72 0.81  CALCIUM 8.4 8.2* 8.2* 8.0* 8.2*   Liver Function Tests:  Recent Labs Lab 05/28/13 0200 05/30/13 0548 05/31/13 1045 06/02/13 0654  AST 25 46* 36 44*  ALT 25 56* 47* 51*  ALKPHOS 100 132* 140* 175*  BILITOT 0.9 1.1 0.7 0.7  PROT 6.9 6.4 6.2 6.6  ALBUMIN 3.2* 2.8* 2.7* 2.9*   No results found for this basename: LIPASE, AMYLASE,  in the last 168 hours  Recent Labs Lab 05/31/13 1025 06/01/13 0626 06/02/13 0654  AMMONIA 75* 121* 51   CBC:  Recent Labs Lab 05/28/13 0200 05/29/13 0612 05/30/13 0548 06/01/13 0626 06/02/13 0654  WBC 5.1 2.6* 2.5* 2.8* 3.1*  NEUTROABS 4.0  --   --   --   --   HGB 9.2* 9.0* 9.0* 8.8* 9.0*  HCT 28.5* 26.9* 27.1* 26.1* 27.6*  MCV 82.4 82.0 81.6 80.8 80.9  PLT PLATELET CLUMPS NOTED ON SMEAR, COUNT APPEARS DECREASED 43* 44* 50* 53*   Cardiac Enzymes: No results found for this basename: CKTOTAL, CKMB, CKMBINDEX, TROPONINI,  in the last 168 hours BNP (last 3 results) No results found for this basename: PROBNP,  in the last 8760 hours CBG: No results found for this basename: GLUCAP,  in the last 168 hours  Recent Results (from the past 240 hour(s))  BODY FLUID CULTURE     Status: None   Collection Time    05/28/13  9:50 AM      Result Value Ref Range Status   Specimen Description ASCITIC FLUID   Final   Special Requests NONE   Final   Gram Stain     Final   Value: ABUNDANT WBC PRESENT,BOTH PMN AND MONONUCLEAR     NO ORGANISMS SEEN     Performed at  [facility]   Culture     Final   Value: NO GROWTH 3 DAYS     Performed at [facility]   Report Status 05/31/2013 FINAL   Final  BODY FLUID CULTURE     Status: None   Collection Time    05/30/13 12:15 PM      Result Value Ref Range Status   Specimen Description ASCITIC FLUID   Final   Special Requests NONE   Final   Gram Stain     Final   Value: RARE WBC PRESENT, PREDOMINANTLY PMN     NO ORGANISMS SEEN     Performed at [facility]   Culture     Final   Value: NO GROWTH 2 DAYS     Performed at [facility]   Report Status PENDING   Incomplete     Studies: No results found.  Scheduled Meds: . baclofen  10 mg Oral BID  . cyclobenzaprine  10 mg Oral QHS  . DULoxetine  20 mg Oral Daily  . lactulose  30 g Oral TID  . mirtazapine  15 mg Oral QHS  . multivitamin with minerals  1 tablet Oral Daily  . pantoprazole  40 mg Oral QAC breakfast  . rifaximin  550 mg Oral BID  . sulfamethoxazole-trimethoprim  1 tablet Oral Daily  . traZODone  50 mg Oral QHS   Continuous Infusions: . sodium chloride 20 mL/hr (05/31/13 2053)    Principal Problem:   SBP (spontaneous bacterial peritonitis) Active Problems:   Cirrhosis of liver   Anemia   Thrombocytopenia   Hyperglycemia   Chronic hepatitis C   Abdominal pain, right upper quadrant    Time spent: 35 minutes. Greater than 50% of this time was spent in direct contact with the patient coordinating care.    [facility] Pager [phone]  If 7PM-7AM, please contact night-coverage at [email or web address], password [facility] 06/02/2013, 9:50 AM  LOS: 5 days

## 2013-06-02 NOTE — Progress Notes (Signed)
Subjective: Since I last evaluated the patient FEELING  A LITTLE MORE CLOUDY. ABDOMEN IS MORE DISTENDED. ABD IS ALWAYS TENDER BUT NO WORSE THAN YESTERDAY. BMx1 YESTERDAY AND TODAY. CONTINUES WITH LACTULOSE TID/XIFAXAN BID.  Objective: Vital signs in last 24 hours: Temp:  [97.8 F (36.6 C)-98 F (36.7 C)] 97.9 F (36.6 C) (04/26 0605) Pulse Rate:  [69-81] 72 (04/26 0605) Resp:  [20] 20 (04/26 0605) BP: (102-123)/(43-57) 123/57 mmHg (04/26 0605) SpO2:  [97 %-99 %] 98 % (04/26 0605) Weight:  [162 lb 0.6 oz (73.5 kg)] 162 lb 0.6 oz (73.5 kg) (04/26 0605) Last BM Date: 06/01/13  Intake/Output from previous day: 04/25 0701 - 04/26 0700 In: 840 [P.O.:840] Out: 1350 [Urine:1350] Intake/Output this shift:    General appearance: alert, cooperative and mild distress Resp: clear to auscultation bilaterally Cardio: regular rate and rhythm GI: soft, non-tender; bowel sounds normal; no masses,  no organomegaly  Lab Results:  Recent Labs  06/01/13 0626 06/02/13 0654  WBC 2.8* 3.1*  HGB 8.8* 9.0*  HCT 26.1* 27.6*  PLT 50* 53*   BMET  Recent Labs  05/31/13 1045 06/02/13 0654  NA 131* 133*  K 3.6* 3.8  CL 96 97  CO2 26 26  GLUCOSE 449* 282*  BUN 10 9  CREATININE 0.72 0.81  CALCIUM 8.0* 8.2*   LFT  Recent Labs  06/02/13 0654  PROT 6.6  ALBUMIN 2.9*  AST 44*  ALT 51*  ALKPHOS 175*  BILITOT 0.7   PT/INR  Recent Labs  05/31/13 1025  LABPROT 17.6*  INR 1.49    Medications: I have reviewed the patient's current medications.  Assessment/Plan: ADMITTED WITH SBP. COMPLETED 5 DAYS OF ABX. NOW ON BACTRIM QD. ASCITES INCREASED. BAD PAIN STABLE.  PLAN: 1. CONTINUE BACTRIM 2. DR. Laural Golden WILL DECIDE REGARDING PARACENTESIS TOMORROW. LIKLEY WILL HAVE 2-3Ls REMOVED AND RECHECK CEL CT. 3.  CONTINUE XIFAXAN/LACTULOSE   LOS: 5 days   Lataysha Vohra L Tkai Large 06/02/2013, 10:23 AM

## 2013-06-03 ENCOUNTER — Inpatient Hospital Stay (HOSPITAL_COMMUNITY): Payer: BC Managed Care – PPO

## 2013-06-03 LAB — COMPREHENSIVE METABOLIC PANEL
ALBUMIN: 2.6 g/dL — AB (ref 3.5–5.2)
ALK PHOS: 164 U/L — AB (ref 39–117)
ALT: 45 U/L — ABNORMAL HIGH (ref 0–35)
AST: 36 U/L (ref 0–37)
BUN: 10 mg/dL (ref 6–23)
CO2: 27 mEq/L (ref 19–32)
Calcium: 8.1 mg/dL — ABNORMAL LOW (ref 8.4–10.5)
Chloride: 99 mEq/L (ref 96–112)
Creatinine, Ser: 0.94 mg/dL (ref 0.50–1.10)
GFR calc Af Amer: 80 mL/min — ABNORMAL LOW (ref >90)
GFR calc non Af Amer: 69 mL/min — ABNORMAL LOW (ref >90)
Glucose, Bld: 278 mg/dL — ABNORMAL HIGH (ref 70–99)
POTASSIUM: 3.6 meq/L — AB (ref 3.7–5.3)
SODIUM: 135 meq/L — AB (ref 137–147)
Total Bilirubin: 0.6 mg/dL (ref 0.3–1.2)
Total Protein: 6.1 g/dL (ref 6.0–8.3)

## 2013-06-03 LAB — CBC
HEMATOCRIT: 26 % — AB (ref 36.0–46.0)
Hemoglobin: 8.6 g/dL — ABNORMAL LOW (ref 12.0–15.0)
MCH: 26.8 pg (ref 26.0–34.0)
MCHC: 33.1 g/dL (ref 30.0–36.0)
MCV: 81 fL (ref 78.0–100.0)
PLATELETS: 55 10*3/uL — AB (ref 150–400)
RBC: 3.21 MIL/uL — ABNORMAL LOW (ref 3.87–5.11)
RDW: 16.6 % — AB (ref 11.5–15.5)
WBC: 3.3 10*3/uL — ABNORMAL LOW (ref 4.0–10.5)

## 2013-06-03 LAB — BODY FLUID CELL COUNT WITH DIFFERENTIAL
Eos, Fluid: 0 %
LYMPHS FL: 17 %
MONOCYTE-MACROPHAGE-SEROUS FLUID: 76 % (ref 50–90)
Neutrophil Count, Fluid: 7 % (ref 0–25)
Total Nucleated Cell Count, Fluid: 205 cu mm (ref 0–1000)

## 2013-06-03 MED ORDER — OXYCODONE HCL 5 MG PO TABS: 5.0000 mg | ORAL_TABLET | ORAL | Status: DC | PRN

## 2013-06-03 MED ORDER — LACTULOSE 10 GM/15ML PO SOLN: 30.0000 g | Freq: Three times a day (TID) | ORAL | Status: DC

## 2013-06-03 MED ORDER — ALBUMIN HUMAN 25 % IV SOLN
25.0000 g | Freq: Once | INTRAVENOUS | Status: AC
  Administered 2013-06-03: 25 g via INTRAVENOUS
  Filled 2013-06-03: qty 100

## 2013-06-03 MED ORDER — ONDANSETRON HCL 4 MG PO TABS: 4.0000 mg | ORAL_TABLET | Freq: Four times a day (QID) | ORAL | Status: DC | PRN

## 2013-06-03 MED ORDER — SULFAMETHOXAZOLE-TMP DS 800-160 MG PO TABS: 1.0000 | ORAL_TABLET | Freq: Every day | ORAL | Status: DC

## 2013-06-03 NOTE — Progress Notes (Signed)
Inpatient Diabetes Program Recommendations  AACE/ADA: New Consensus Statement on Inpatient Glycemic Control (2013)  Target Ranges:  Prepandial:   less than 140 mg/dL      Peak postprandial:   less than 180 mg/dL (1-2 hours)      Critically ill patients:  140 - 180 mg/dL   Results for [name], [name] (MRN [id number]) as of 06/03/2013 07:55  Ref. Range 05/31/2013 10:45 06/02/2013 06:54 06/03/2013 05:48  Glucose Latest Range: 70-99 mg/dL 449 (H) 282 (H) 278 (H)   Diabetes history: No Outpatient Diabetes medications: NA Current orders for Inpatient glycemic control: None  Inpatient Diabetes Program Recommendations Correction (SSI): Please consider ordering CBGs with Novolog correction ACHS. HgbA1C: A1C was 6.7% on 05/29/13.  Per ADA, if A1C is 6.5% or greater then criteria for diabetes diagnosis is met. Diet: May want to add Carb Modified to current diet.  Thanks, Barnie Alderman, RN, MSN, CCRN Diabetes Coordinator Inpatient Diabetes Program [phone] (Team Pager) [phone] ([facility] office) [phone] [facility] office)

## 2013-06-03 NOTE — Progress Notes (Signed)
Subjective; Patient continues to complain of abdominal pain which is more than right upper quadrant. It is not any worse than was yesterday. She reports abdominal distention. Appetite is fair. She denies nausea or vomiting. She still complains of tremors.  Objective; BP 117/60  Pulse 73  Temp(Src) 98.2 F (36.8 C) (Oral)  Resp 20  Ht 5\' 6"  (1.676 m)  Wt 162 lb 0.6 oz (73.5 kg)  BMI 26.17 kg/m2  SpO2 99% Patient is alert and does not have asterixis. Abdomen is distended but not tense. On palpation it is soft and mild tenderness in epigastrium and left upper quadrant and mild to moderate tenderness in right upper quadrant. Easily palpable large spleen. Extremities are thin without edema.  Lab data;  Ascitic fluid cultures remain negative. WBC 3.3, H&H 8.6 and 26.0 and platelet count 55K Glucose 278, BUN 10, creatinine 0.94. Bilirubin 0.6, AP 164, AST 36, ALT 45 and albumin 2.6. Calcium 8.1 Serum ammonia 51   Assessment; Spontaneous bacterial peritonitis. Cultures have remained negative and she has been documented to have drop in her cell count with therapy. It is a little abdominal pain would appear to be due to SBP. Some of her pain may be due to fibromyalgia. Advanced cirrhosis secondary to successfully treated hep C complicated by pancytopenia. She will get back on by mouth iron when she goes home. Glucose level is elevated possibly due to lactulose. Patient is diabetic.  Recommendations; Abdominal paracentesis both for diagnostic and therapeutic purposes. Albumin 25 gm IV. Unless cell count is high she should be able to go home later today

## 2013-06-03 NOTE — Progress Notes (Signed)
Paracentesis complete no signs of distress. 3500 ml yellow colored ascites removed.

## 2013-06-03 NOTE — Discharge Summary (Signed)
Physician Discharge Summary  [name] [id number] DOB: Oct 01, 1960 DOA: 05/28/2013  PCP: Glo Herring., MD  Admit date: 05/28/2013 Discharge date: 06/03/2013  Time spent: 45 minutes  Recommendations for Outpatient Follow-up:  -Will be discharged home today. -Advised to follow up with PCP in 2 weeks.   Discharge Diagnoses:  Principal Problem:   SBP (spontaneous bacterial peritonitis) Active Problems:   Cirrhosis of liver   Anemia   Thrombocytopenia   Hyperglycemia   Chronic hepatitis C   Abdominal pain, right upper quadrant   Discharge Condition: Stable and improved  Filed Weights   05/31/13 0558 06/01/13 0606 06/02/13 0605  Weight: 70.3 kg (154 lb 15.7 oz) 72.8 kg (160 lb 7.9 oz) 73.5 kg (162 lb 0.6 oz)    History of present illness:  53 year old female with a history of chronic hepatitis C with hepatic cirrhosis, portal vein thrombosis, esophageal varices presents with one-day history of excruciating abdominal pain. The patient states that she had an episode of spontaneous bacterial peritonitis (SBP) approximately 8 years ago. She is not on any SBP prophylaxis. The patient states that she last followed up with Dr. Laural Golden in March 2015. She endorses compliance with all her medications. She is currently undergoing evaluation for liver transplantation at [facility], but she is not on the transplant list formally at [facility]. Her transplant coordinator is [employer]. However, she is on the transplant list at [facility] [place]. Patient complains of subjective fevers and chills but denies any chest pain, shortness breath, vomiting, diarrhea, hematochezia, melena, hematemesis, headache, dizziness. There is no dysuria or hematuria. CT of the abdomen and pelvis revealed cirrhosis with splenomegaly and ascites. There was upper abdominal varices and evidence of prior gastric bypass.  In the emergency department, the patient had a paracentesis removing 3 L of fluid. Ascites showed  WBC 4646 with 87% neutrophils. Gram stain was negative for any organisms. The patient was given a dose of ciprofloxacin and 4 mg of intravenous morphine. CBC revealed WBC 5.1, hemoglobin 9.2. There was platelet clumping. BMP was unremarkable. Hepatic enzymes were unremarkable. Hospitalist admission was requested.   Hospital Course:   Abdominal Pain  -Presumed related to SBP.  -Definitely has a chronic component.   SBP  -Cx without growth thus far  -Had 4646 WBCs in ascitic fluid on initial paracentesis, down to 553 on repeat paracentesis and 250 on third paracentesis on 4/27.  -Pt completed rocephin for a total of 5 days.  -Started on prophylactic bactrim as per Dr. Olevia Perches recommendations.   - Will resume home lasix/spironolactone.  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  -Follows with Dr. Laural Golden locally.  -Ammonia levels decreased with resumption of lactulose.  Thrombocytopenia  -2/2 cirrhosis.  -Stable at around 45,000.  Hyperglycemia  -no h/o DM.  -A1c 6.7   Procedures:  Paracentesis x 3   Consultations:  GI, Dr. Laural Golden  Discharge Instructions  Discharge Orders   Future Appointments Provider Department Dept Phone   08/20/2013 3:30 PM Rogene Houston, MD [place] [phone]   Future Orders Complete By Expires   Diet - low sodium heart healthy  As directed    Discontinue IV  As directed    Increase activity slowly  As directed        Medication List         b complex vitamins tablet  Take 1 tablet by mouth daily.  baclofen 10 MG tablet  Commonly known as:  LIORESAL  Take 10 mg by mouth 2 (two) times daily.     cyclobenzaprine 10 MG tablet  Commonly known as:  FLEXERIL  Take 10 mg by mouth at bedtime.     DULoxetine 20 MG capsule  Commonly known as:  CYMBALTA  Take 20 mg by mouth daily.     ferrous sulfate 325 (65 FE) MG tablet  Take 325 mg by mouth 2 (two) times daily with a  meal.     furosemide 40 MG tablet  Commonly known as:  LASIX  Take 80 mg by mouth daily.     lactulose 10 GM/15ML solution  Commonly known as:  CHRONULAC  Take 45 mLs (30 g total) by mouth 3 (three) times daily.     mirtazapine 15 MG tablet  Commonly known as:  REMERON  Take 15 mg by mouth at bedtime.     multivitamin with minerals Tabs tablet  Take 1 tablet by mouth daily.     nadolol 20 MG tablet  Commonly known as:  CORGARD  Take 10 mg by mouth every morning.     omeprazole 20 MG capsule  Commonly known as:  PRILOSEC  Take 1 capsule (20 mg total) by mouth 2 (two) times daily before a meal.     ondansetron 4 MG tablet  Commonly known as:  ZOFRAN  Take 1 tablet (4 mg total) by mouth every 6 (six) hours as needed for nausea.     oxyCODONE 5 MG immediate release tablet  Commonly known as:  Oxy IR/ROXICODONE  Take 1 tablet (5 mg total) by mouth every 4 (four) hours as needed for severe pain.     spironolactone 100 MG tablet  Commonly known as:  ALDACTONE  Take 300 mg by mouth daily.     sulfamethoxazole-trimethoprim 800-160 MG per tablet  Commonly known as:  BACTRIM DS  Take 1 tablet by mouth daily.     traZODone 50 MG tablet  Commonly known as:  DESYREL  Take 1 tablet (50 mg total) by mouth at bedtime.     XIFAXAN 550 MG Tabs tablet  Generic drug:  rifaximin  Take 550 mg by mouth 2 (two) times daily.       Allergies  Allergen Reactions  . Ancef [Cefazolin Sodium] Other (See Comments)    "Blisters in mouth and vagina"       Follow-up Information   Follow up with Glo Herring., MD. Schedule an appointment as soon as possible for a visit in 2 weeks.   Specialty:  Internal Medicine   Contact information:   [id number] [place] [address] [phone]        The results of significant diagnostics from this hospitalization (including imaging, microbiology, ancillary and laboratory) are listed below for reference.     Significant Diagnostic Studies: Ct Abdomen Pelvis W Contrast  05/28/2013   CLINICAL DATA:  Abdominal pain. History of liver failure. Gastric bypass.  EXAM: CT ABDOMEN AND PELVIS WITH CONTRAST  TECHNIQUE: Multidetector CT imaging of the abdomen and pelvis was performed using the standard protocol following bolus administration of intravenous contrast.  CONTRAST:  18mL OMNIPAQUE IOHEXOL 300 MG/ML SOLN, 173mL OMNIPAQUE IOHEXOL 300 MG/ML SOLN  COMPARISON:  US PARACENTESIS dated 01/18/2013; CT ABD - PELV W/ CM dated 06/29/2012  FINDINGS: The lung bases are clear.  Changes of hepatic cirrhosis with marked splenic enlargement. Portal veins demonstrate evidence of cavernous transformation. Diffuse abdominal  free fluid consistent with ascites. This has progressed since the previous study. The gallbladder is surgically absent. No bile duct dilatation. Pancreas and adrenal glands are normal. Multiple nonobstructing stones in the right kidney. No hydronephrosis in either kidney. Postoperative changes consistent with history of gastric bypass. Upper abdominal varices. Small bowel and colon are not abnormally distended. No free air. Surgical clips in the mesentery.  Pelvis: Ascites extends into the pelvis. Bladder wall is not thickened. No pelvic mass or lymphadenopathy. Appendix is normal. No diverticulitis. No destructive bone lesions. Mild degenerative changes in the spine.  IMPRESSION: Hepatic cirrhosis with portal venous hypertension and splenomegaly as well as varices. Interval increase of amount of ascites. Multiple nonobstructing right renal stones. Postoperative cholecystectomy and gastric bypass.   Electronically Signed   By: Lucienne Capers M.D.   On: 05/28/2013 04:57   US Paracentesis  05/30/2013   CLINICAL DATA:  Cirrhosis, ascites, spontaneous bacterial peritonitis  EXAM: ULTRASOUND GUIDED DIAGNOSTIC AND THERAPEUTIC PARACENTESIS  COMPARISON:  05/28/2013  PROCEDURE: An ultrasound guided paracentesis was  thoroughly discussed with the patient and questions answered. The benefits, risks, alternatives and complications were also discussed. The patient understands and wishes to proceed with the procedure. Written consent was obtained.  Ultrasound was performed to localize and mark an adequate pocket of fluid in the right quadrant of the abdomen. The area was then prepped and draped in the normal sterile fashion. 1% Lidocaine was used for local anesthesia. Under ultrasound guidance a 5 Fr Yueh catheter was introduced. Paracentesis was performed. The catheter was removed and a dressing applied.  Complications: None  FINDINGS: A total of approximately 2200 ml of yellow fluid was removed. A fluid sample of 180 ml was sent for requested laboratory analysis.  IMPRESSION: Successful ultrasound guided paracentesis yielding 2200 ml of ascites.   Electronically Signed   By: Lavonia Dana M.D.   On: 05/30/2013 17:40   US Paracentesis  05/28/2013   CLINICAL DATA:  Cirrhosis, ascites, abdominal pain  EXAM: ULTRASOUND GUIDED diagnostic and therapeutic PARACENTESIS  COMPARISON:  01/18/2013  PROCEDURE: An ultrasound guided paracentesis was thoroughly discussed with the patient and questions answered. The benefits, risks, alternatives and complications were also discussed. The patient understands and wishes to proceed with the procedure. Written consent was obtained. Timeout protocol followed.  Ultrasound was performed to localize and mark an adequate pocket of fluid in the right quadrant of the abdomen. The area was then prepped and draped in the normal sterile fashion. 1% Lidocaine was used for local anesthesia. Under ultrasound guidance a 5 [place] Yueh catheter was introduced. Paracentesis was performed. The catheter was removed and a dressing applied.  Complications: None  FINDINGS: A total of approximately 3000 mL of amber colored fluid was removed. A fluid sample of 180 mL was sent for laboratory analysis.  IMPRESSION: Successful  ultrasound guided paracentesis yielding 3000 mL of amber colored ascites.   Electronically Signed   By: Lavonia Dana M.D.   On: 05/28/2013 11:23    Microbiology: Recent Results (from the past 240 hour(s))  BODY FLUID CULTURE     Status: None   Collection Time    05/28/13  9:50 AM      Result Value Ref Range Status   Specimen Description ASCITIC FLUID   Final   Special Requests NONE   Final   Gram Stain     Final   Value: ABUNDANT WBC PRESENT,BOTH PMN AND MONONUCLEAR     NO ORGANISMS SEEN  Performed at [facility]     Final   Value: NO GROWTH 3 DAYS     Performed at [facility]   Report Status 05/31/2013 FINAL   Final  BODY FLUID CULTURE     Status: None   Collection Time    05/30/13 12:15 PM      Result Value Ref Range Status   Specimen Description ASCITIC FLUID   Final   Special Requests NONE   Final   Gram Stain     Final   Value: RARE WBC PRESENT, PREDOMINANTLY PMN     NO ORGANISMS SEEN     Performed at [facility]   Culture     Final   Value: NO GROWTH 3 DAYS     Performed at [facility]   Report Status 06/02/2013 FINAL   Final     Labs: Basic Metabolic Panel:  Recent Labs Lab 05/29/13 0612 05/30/13 0548 05/31/13 1045 06/02/13 0654 06/03/13 0548  NA 138 138 131* 133* 135*  K 3.8 3.6* 3.6* 3.8 3.6*  CL 102 102 96 97 99  CO2 27 28 26 26 27   GLUCOSE 171* 199* 449* 282* 278*  BUN 11 12 10 9 10   CREATININE 0.84 0.77 0.72 0.81 0.94  CALCIUM 8.2* 8.2* 8.0* 8.2* 8.1*   Liver Function Tests:  Recent Labs Lab 05/28/13 0200 05/30/13 0548 05/31/13 1045 06/02/13 0654 06/03/13 0548  AST 25 46* 36 44* 36  ALT 25 56* 47* 51* 45*  ALKPHOS 100 132* 140* 175* 164*  BILITOT 0.9 1.1 0.7 0.7 0.6  PROT 6.9 6.4 6.2 6.6 6.1  ALBUMIN 3.2* 2.8* 2.7* 2.9* 2.6*   No results found for this basename: LIPASE, AMYLASE,  in the last 168 hours  Recent Labs Lab 05/31/13 1025 06/01/13 0626 06/02/13 0654  AMMONIA 75* 121* 51     CBC:  Recent Labs Lab 05/28/13 0200 05/29/13 0612 05/30/13 0548 06/01/13 0626 06/02/13 0654 06/03/13 0548  WBC 5.1 2.6* 2.5* 2.8* 3.1* 3.3*  NEUTROABS 4.0  --   --   --   --   --   HGB 9.2* 9.0* 9.0* 8.8* 9.0* 8.6*  HCT 28.5* 26.9* 27.1* 26.1* 27.6* 26.0*  MCV 82.4 82.0 81.6 80.8 80.9 81.0  PLT PLATELET CLUMPS NOTED ON SMEAR, COUNT APPEARS DECREASED 43* 44* 50* 53* 55*   Cardiac Enzymes: No results found for this basename: CKTOTAL, CKMB, CKMBINDEX, TROPONINI,  in the last 168 hours BNP: BNP (last 3 results) No results found for this basename: PROBNP,  in the last 8760 hours CBG: No results found for this basename: GLUCAP,  in the last 168 hours     Signed:  Erline Hau  [facility] Pager: [phone] 06/03/2013, 2:03 PM

## 2013-06-03 NOTE — Discharge Planning (Signed)
Pt stated she was ready to be DC and pain was under control.  Pt's IV removed and she was given DC papers and education with family in room.  Pt educated on s/sx of further infection and when to call doctor or return to the hospital.  Pt told of FU visits and also given scripts.  Pt was wheeled to car by RN and family.

## 2013-06-03 NOTE — Progress Notes (Signed)
Nutrition Brief Note  RD pulled to chart due to LOS  Wt Readings from Last 15 Encounters:  06/02/13 162 lb 0.6 oz (73.5 kg)  04/17/13 149 lb 9.6 oz (67.858 kg)  04/01/13 154 lb (69.854 kg)  12/24/12 154 lb 11.2 oz (70.171 kg)  11/05/12 148 lb (67.132 kg)  11/02/12 148 lb (67.132 kg)  11/02/12 148 lb (67.132 kg)  10/25/12 148 lb (67.132 kg)  10/20/12 150 lb (68.04 kg)  09/30/12 142 lb (64.411 kg)  09/25/12 149 lb 11.2 oz (67.903 kg)  06/26/12 149 lb 9.6 oz (67.858 kg)  06/05/12 150 lb 2.1 oz (68.1 kg)  05/14/12 159 lb 8 oz (72.349 kg)  12/10/11 160 lb 11.2 oz (72.893 kg)    Body mass index is 26.17 kg/(m^2). Patient meets criteria for overweight based on current BMI.   Current diet order is 2 gm NA, patient is consuming approximately 50-100% of meals at this time. Labs and medications reviewed.   No nutrition interventions warranted at this time. If nutrition issues arise, please consult RD.   [name], RD, LDN Pager: [phone]

## 2013-06-04 ENCOUNTER — Telehealth (INDEPENDENT_AMBULATORY_CARE_PROVIDER_SITE_OTHER): Payer: Self-pay | Admitting: *Deleted

## 2013-06-04 DIAGNOSIS — K1370 Unspecified lesions of oral mucosa: Secondary | ICD-10-CM

## 2013-06-04 NOTE — Telephone Encounter (Signed)
I will forward this to Terri to address as Dr.Rehman is at the hospital.

## 2013-06-04 NOTE — Telephone Encounter (Signed)
[name]'s is needing some magic mouth wash from all the antibiotic she has been on. Please send it the [place].

## 2013-06-05 LAB — PATHOLOGIST SMEAR REVIEW

## 2013-06-05 NOTE — Telephone Encounter (Signed)
She has already called Dr. Gerarda Fraction and has the medication.

## 2013-06-05 NOTE — Telephone Encounter (Signed)
On 06-04-13 a message was left for Verdis Frederickson, home health nurse. [id number] Per Dr.Rehman a verbal order was left stating that it was okay for the patient tot have both occupational and physical therapy.

## 2013-06-06 LAB — BODY FLUID CULTURE
Culture: NO GROWTH
Gram Stain: NONE SEEN

## 2013-06-08 LAB — ANAEROBIC CULTURE: GRAM STAIN: NONE SEEN

## 2013-06-24 ENCOUNTER — Encounter (INDEPENDENT_AMBULATORY_CARE_PROVIDER_SITE_OTHER): Payer: Self-pay | Admitting: Internal Medicine

## 2013-06-24 ENCOUNTER — Ambulatory Visit (INDEPENDENT_AMBULATORY_CARE_PROVIDER_SITE_OTHER): Payer: BC Managed Care – PPO | Admitting: Internal Medicine

## 2013-06-24 VITALS — BP 110/72 | HR 70 | Temp 97.0°F | Resp 18 | Ht 66.0 in | Wt 148.2 lb

## 2013-06-24 DIAGNOSIS — K7682 Hepatic encephalopathy: Secondary | ICD-10-CM

## 2013-06-24 DIAGNOSIS — R188 Other ascites: Secondary | ICD-10-CM

## 2013-06-24 DIAGNOSIS — K7460 Unspecified cirrhosis of liver: Secondary | ICD-10-CM

## 2013-06-24 DIAGNOSIS — K7290 Hepatic failure, unspecified without coma: Secondary | ICD-10-CM

## 2013-06-24 DIAGNOSIS — D649 Anemia, unspecified: Secondary | ICD-10-CM

## 2013-06-24 NOTE — Progress Notes (Addendum)
Presenting complaint;  Followup for advanced liver disease.  Subjective:  Patient is 53 year old Caucasian female was at her scheduled visit. Patient was admitted to [facility] on 05/28/2013 for abdominal pain and progressive ascites and diagnosed with spontaneous bacterial peritonitis(culture negative neutrocytic ascites). She had followup abdominal tap to confirm response to therapy as evidenced by drop in cell count. Patient feels better. She says her ascites has not reaccumulated since she left the hospital. She has not gained any weight in the last one month. She continues to complain of feeling tired and weak. She has intermittent upper abdominal pain. She denies nausea vomiting hematemesis melena or rectal bleeding. Her bowels move 2-3 times day. She is not having any side effects with Bactrim. She has an appointment with Dr. Monica Martinez later this week. She is hoping to be listed for liver transplant. She has intermittent nausea. She is taken 4 doses in one month.  Current Medications: Outpatient Encounter Prescriptions as of 06/24/2013  Medication Sig  . b complex vitamins tablet Take 1 tablet by mouth daily.  . baclofen (LIORESAL) 10 MG tablet Take 10 mg by mouth 2 (two) times daily.   . DULoxetine (CYMBALTA) 20 MG capsule Take 20 mg by mouth daily.   . ferrous sulfate 325 (65 FE) MG tablet Take 325 mg by mouth 2 (two) times daily with a meal.  . furosemide (LASIX) 40 MG tablet Take 80 mg by mouth daily.   Marland Kitchen lactulose (CHRONULAC) 10 GM/15ML solution Take 45 mLs (30 g total) by mouth 3 (three) times daily.  . mirtazapine (REMERON) 15 MG tablet Take 15 mg by mouth at bedtime.  . Multiple Vitamin (MULTIVITAMIN WITH MINERALS) TABS tablet Take 1 tablet by mouth daily.  . nadolol (CORGARD) 20 MG tablet Take 10 mg by mouth every morning.   Marland Kitchen omeprazole (PRILOSEC) 20 MG capsule Take 1 capsule (20 mg total) by mouth 2 (two) times daily before a meal.  . ondansetron (ZOFRAN) 4 MG tablet Take 1 tablet (4  mg total) by mouth every 6 (six) hours as needed for nausea.  . rifaximin (XIFAXAN) 550 MG TABS Take 550 mg by mouth 2 (two) times daily.  Marland Kitchen spironolactone (ALDACTONE) 100 MG tablet Take 300 mg by mouth daily.   Marland Kitchen sulfamethoxazole-trimethoprim (BACTRIM DS) 800-160 MG per tablet Take 1 tablet by mouth daily.  . traZODone (DESYREL) 50 MG tablet Take 1 tablet (50 mg total) by mouth at bedtime.  . [DISCONTINUED] cyclobenzaprine (FLEXERIL) 10 MG tablet Take 10 mg by mouth at bedtime.   . [DISCONTINUED] oxyCODONE (OXY IR/ROXICODONE) 5 MG immediate release tablet Take 1 tablet (5 mg total) by mouth every 4 (four) hours as needed for severe pain.     Objective: Blood pressure 110/72, pulse 70, temperature 97 F (36.1 C), temperature source Oral, resp. rate 18, height 5\' 6"  (1.676 m), weight 148 lb 3.2 oz (67.223 kg). Patient is alert and does not have asterixis. Conjunctiva is pink. Sclera is nonicteric Oropharyngeal mucosa is normal. No neck masses or thyromegaly noted. Cardiac exam with regular rhythm normal S1 and S2. She has faint systolic ejection murmur best heard at LLSB. Lungs are clear to auscultation. Abdomen is full but soft with easily palpable spleen and mild midepigastric tenderness. Both flanks are dull. No LE edema or clubbing noted.    Assessment:  #1. History of SBP. SBP occurred while she was on Cipro. She responded to IV antibiotic therapy and now is on Bactrim DS one tablet daily and doing well. #2.  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 She is in need of liver transplant. Her disease is much worse than her meld score would indicate. #3. Hepatic encephalopathy. She is doing well with therapy. #4. History of iron deficiency anemia.   Plan:  Patient will continue Bactrim DS one tablet daily. Since she has an appointment at [facility] will hold off doing CBC and metabolic 7 as she would be getting blood work at [facility]. Office visit in 3  months.

## 2013-06-24 NOTE — Patient Instructions (Signed)
Call if ascites re-accumulates.

## 2013-06-26 ENCOUNTER — Ambulatory Visit (INDEPENDENT_AMBULATORY_CARE_PROVIDER_SITE_OTHER): Payer: BC Managed Care – PPO | Admitting: Internal Medicine

## 2013-08-05 ENCOUNTER — Encounter: Payer: Self-pay | Admitting: Neurology

## 2013-08-05 ENCOUNTER — Ambulatory Visit (INDEPENDENT_AMBULATORY_CARE_PROVIDER_SITE_OTHER): Payer: BC Managed Care – PPO | Admitting: Neurology

## 2013-08-05 VITALS — BP 111/67 | HR 71 | Resp 18 | Ht 65.0 in | Wt 143.0 lb

## 2013-08-05 DIAGNOSIS — K7200 Acute and subacute hepatic failure without coma: Secondary | ICD-10-CM

## 2013-08-05 DIAGNOSIS — R5383 Other fatigue: Principal | ICD-10-CM

## 2013-08-05 DIAGNOSIS — R5381 Other malaise: Secondary | ICD-10-CM

## 2013-08-05 DIAGNOSIS — R0902 Hypoxemia: Secondary | ICD-10-CM

## 2013-08-05 NOTE — Progress Notes (Signed)
[name]   Provider:  Larey Seat, M D  Referring Provider: Redmond School, MD Primary Care Physician:  Glo Herring., MD  Chief Complaint  Patient presents with  . New Evaluation    Room 10  . Sleep consult    HPI:  [name] is a 53 y.o. caucasian, right handed  female, who is seen here as a referral  from Dr. Gerarda Fraction for a sleep evaluation .  Dear Dr. Gerarda Fraction ,  Thank you for allowing me to see your patient [name] today for a sleep medicine evaluation.  As you know she is on a waiting list for a hepatic transplant and suffers from increasing insomnia and fatigue. She suffered from bacterial peritonitis just this April, a life threatening condition. She acknowledged that she is at this time sleeping poorly because of stress and worries  related to medical costs, doctor's appointments and her medical problems in general.   Recently, on 07-29-13, your  office arranged for an overnight pulse oximetry on room air.  This returned with only 2 hours and 13 minutes off reading time and documented 12.3 minutes of  desaturation oxygen levels at night- with the lowest oxygen saturation at 84%.  The patient advised me,  that she lost her finger clip at around 2:30 in the morning , so the study is very short and may not be valid to qualify for  oxygen use overnight- based on this test - period.  What is of  interest is that the patient did have a complaint not as much off sleepiness, but of excessive daytime fatigue, feeling off not having any energy.  The patient goes to bed 20 minutes after taking her night time medication, and will sleep at about 11.20 PM. She will sleep through the night and wake up at 6 AM , which used to be her time to rise and go to work. She now is considered disabled (since March 2015) and could sleep in, she will remain in bed and sleep another 4-5 hours - and will fall asleep again around 3 PM. This nap will add another  hour of sleep time.  Total nocturnal sleep time is 10-12 hours now! Some time she goes to sleep in front of her TV around 9 PM already again.  She has no history of shift work.  The endorsed the Epworth  score at 7 points , the fatigue severity score at 55 points. GDS at 10 points      Review of Systems: Out of a complete 14 system review, the patient complains of only the following symptoms, and all other reviewed systems are negative.She has no history of shift work.  The endorsed the Epworth  score at 7 points , the fatigue severity score at 55 points. GDS at 10 points     History   Social History  . Marital Status: Divorced    Spouse Name: N/A    Number of Children: 1  . Years of Education: 12   Occupational History  . Not on file.   Social History Main Topics  . Smoking status: Former Smoker -- 0.50 packs/day for 5 years    Types: Cigarettes    Quit date: 05/02/1991  . Smokeless tobacco: Never Used  . Alcohol Use: No  . Drug Use: No  . Sexual Activity: Not Currently   Other Topics Concern  . Not on file   Social History Narrative   Patient is single and  lives at home and her son lives with her.   Patient is disabled.   Patient has a high school education.   Patient is left-handed.   Patient drinks 24 oz of tea/soda daily.    Family History  Problem Relation Age of Onset  . Dementia Mother   . Heart disease Father   . Liver disease Father   . Hypertension Father   . Diabetes Father   . Dementia Father   . Hypertension Brother   . Other Son     Cervical dystonia    Past Medical History  Diagnosis Date  . Esophageal varices   . Cirrhosis     non alcoholic  . Ascites   . Anemia   . Liver failure     "I'm on liver transplant list @ [facility]" (11/07/2012)  . Portal vein thrombosis   . Complication of anesthesia   . PONV (postoperative nausea and vomiting)   . Heart murmur   . Type II diabetes mellitus     "not since gastric bypass" (11/07/2012)  .  Hepatitis C     Hx: of Hep "C" it was eradicated  . [id number]     "monthly" (11/07/2012)  . Anxiety   . Depression   . Kidney stones     Past Surgical History  Procedure Laterality Date  . Abdominal hysterectomy  2003  . Upper gastrointestinal endoscopy    . Colonoscopy    . Gastric bypass  ~ 1995  . Cesarean section  1995  . Lithotripsy      "several times" (11/07/2012)  . Knee arthroscopy Right   . Colonoscopy  10/21/2011    Procedure: COLONOSCOPY;  Surgeon: Rogene Houston, MD;  Location: [facility];  Service: Endoscopy;  Laterality: N/A;  245   . Cystoscopy with stent placement  12/08/2011    Procedure: CYSTOSCOPY WITH STENT PLACEMENT;  Surgeon: Marissa Nestle, MD;  Location: [facility] OR[facility];  Service: Urology;  Laterality: Right;  . Cystoscopy w/ retrogrades  12/08/2011    Procedure: CYSTOSCOPY WITH RETROGRADE PYELOGRAM;  Surgeon: Marissa Nestle, MD;  Location: [facility];  Service: Urology;  Laterality: Right;  . Dilation and curettage of uterus    . Orif distal radius fracture Right 11/07/2012  . Cholecystectomy  1987  . Hernia repair  04/14/11    "one in my bellybutton" (11/07/2012)  . Inguinal hernia repair Right     "maybe 2" (11/07/2012)  . Open reduction internal fixation (orif) distal radial fracture Right 11/07/2012    Procedure: OPEN REDUCTION INTERNAL FIXATION (ORIF) RIGHT DISTAL RADIAL FRACTURE;  Surgeon: [name], MD;  Location: [facility];  Service: Orthopedics;  Laterality: Right;    Current Outpatient Prescriptions  Medication Sig Dispense Refill  . b complex vitamins tablet Take 1 tablet by mouth daily.      . baclofen (LIORESAL) 10 MG tablet Take 10 mg by mouth 2 (two) times daily.       . cyclobenzaprine (FLEXERIL) 10 MG tablet As needed      . DULoxetine (CYMBALTA) 20 MG capsule Take 20 mg by mouth daily.       . ferrous sulfate 325 (65 FE) MG tablet Take 325 mg by mouth 2 (two) times daily with a meal.      . furosemide (LASIX) 40 MG tablet Take 80 mg  by mouth daily.       Marland Kitchen lactulose (CHRONULAC) 10 GM/15ML solution Take 45 mLs (30 g total) by mouth 3 (three)  times daily.  240 mL  3  . mirtazapine (REMERON) 15 MG tablet Take 15 mg by mouth at bedtime.      . Multiple Vitamin (MULTIVITAMIN WITH MINERALS) TABS tablet Take 1 tablet by mouth daily.      . nadolol (CORGARD) 20 MG tablet Take 10 mg by mouth every morning.       Marland Kitchen omeprazole (PRILOSEC) 20 MG capsule Take 1 capsule (20 mg total) by mouth 2 (two) times daily before a meal.  60 capsule  5  . rifaximin (XIFAXAN) 550 MG TABS Take 550 mg by mouth 2 (two) times daily.      Marland Kitchen spironolactone (ALDACTONE) 100 MG tablet Take 300 mg by mouth daily.       Marland Kitchen sulfamethoxazole-trimethoprim (BACTRIM DS) 800-160 MG per tablet Take 1 tablet by mouth daily.  30 tablet  1  . traZODone (DESYREL) 50 MG tablet Take 1 tablet (50 mg total) by mouth at bedtime.  30 tablet  5   No current facility-administered medications for this visit.    Allergies as of 08/05/2013 - Review Complete 08/05/2013  Allergen Reaction Noted  . Ancef [cefazolin sodium] Other (See Comments) 05/02/2011  . Cefazolin Rash 08/05/2013    Vitals: BP 111/67  Pulse 71  Resp 18  Ht 5\' 5"  (1.651 m)  Wt 143 lb (64.864 kg)  BMI 23.80 kg/m2 Last Weight:  Wt Readings from Last 1 Encounters:  08/05/13 143 lb (64.864 kg)   Last Height:   Ht Readings from Last 1 Encounters:  08/05/13 5\' 5"  (1.651 m)    Physical exam:  General: The patient is awake, alert and appears not in acute distress. The patient is well groomed. Head: Normocephalic, atraumatic. Neck is supple. Mallampati 2 , neck circumference: 13 inches  , no TMJ.  Cardiovascular:  Regular rate and rhythm , without  murmurs or carotid bruit, and without distended neck veins. Respiratory: Lungs are clear to auscultation. Skin:  Without evidence of edema, or rash Trunk:patient has normal posture.  Neurologic exam : The patient is awake and alert, oriented to place and  time.  Memory subjective described as intact.  There is a normal attention span & concentration ability. Speech is fluent without   dysarthria, dysphonia or aphasia. Mood and affect are appropriate.  Cranial nerves: Pupils are equal and briskly reactive to light. Funduscopic exam without  evidence of pallor or edema.  Extraocular movements  in vertical and horizontal planes intact and without nystagmus.  Visual fields by finger perimetry are intact. Hearing to finger rub intact.  Facial sensation intact to fine touch. Facial motor strength is symmetric and tongue and uvula move midline.  Motor exam:   Normal tone , muscle bulk and symmetric strength in all extremities.  Sensory:  Fine touch, pinprick and vibration were tested in all extremities. Proprioception is normal.  Coordination: Rapid alternating movements in the fingers/hands is tested and normal. Finger-to-nose maneuver tested and normal without evidence of ataxia, dysmetria or tremor. No asterixis.   Gait and station: Patient walks without assistive device and is able and assisted stool climb up to the exam table. Strength within normal limits. Stance is stable and normal.  Tandem gait unfragmented. Romberg testing is normal.  Deep tendon reflexes: in the  upper and lower extremities are symmetric and intact. Babinski maneuver downgoing.   Assessment:  After physical and neurologic examination, review of laboratory studies, imaging, neurophysiology testing and pre-existing records, assessment is   1) Fatigue related to medical  condition, hypersomnia with long sleep time. 2) hypoxemia is not confirmed by the short ONO time, We need to confirm this in a SPLIT study with capnography.    Plan:  Treatment plan and additional workup : SPLIT , AHI 15, score at 3 % and CO2.

## 2013-08-13 ENCOUNTER — Other Ambulatory Visit (INDEPENDENT_AMBULATORY_CARE_PROVIDER_SITE_OTHER): Payer: Self-pay | Admitting: Internal Medicine

## 2013-08-20 ENCOUNTER — Ambulatory Visit (INDEPENDENT_AMBULATORY_CARE_PROVIDER_SITE_OTHER): Payer: BC Managed Care – PPO | Admitting: Internal Medicine

## 2013-08-27 ENCOUNTER — Other Ambulatory Visit: Payer: Self-pay

## 2013-08-27 DIAGNOSIS — Z1231 Encounter for screening mammogram for malignant neoplasm of breast: Secondary | ICD-10-CM

## 2013-09-01 ENCOUNTER — Ambulatory Visit (INDEPENDENT_AMBULATORY_CARE_PROVIDER_SITE_OTHER): Payer: BC Managed Care – PPO | Admitting: Neurology

## 2013-09-01 ENCOUNTER — Encounter: Payer: Self-pay | Admitting: Neurology

## 2013-09-01 DIAGNOSIS — R0609 Other forms of dyspnea: Secondary | ICD-10-CM

## 2013-09-01 DIAGNOSIS — K7200 Acute and subacute hepatic failure without coma: Secondary | ICD-10-CM

## 2013-09-01 DIAGNOSIS — R5383 Other fatigue: Principal | ICD-10-CM

## 2013-09-01 DIAGNOSIS — R0902 Hypoxemia: Secondary | ICD-10-CM

## 2013-09-01 DIAGNOSIS — R0989 Other specified symptoms and signs involving the circulatory and respiratory systems: Secondary | ICD-10-CM

## 2013-09-01 DIAGNOSIS — R5381 Other malaise: Secondary | ICD-10-CM

## 2013-09-03 ENCOUNTER — Ambulatory Visit: Payer: BC Managed Care – PPO

## 2013-09-03 ENCOUNTER — Telehealth (INDEPENDENT_AMBULATORY_CARE_PROVIDER_SITE_OTHER): Payer: Self-pay | Admitting: *Deleted

## 2013-09-03 NOTE — Telephone Encounter (Signed)
Patient wants to be sch'd for abd tap -- is this ok, please advise

## 2013-09-04 NOTE — Telephone Encounter (Signed)
Okay to schedule LVAP.

## 2013-09-04 NOTE — Sleep Study (Signed)
See media tab for full report  

## 2013-09-05 ENCOUNTER — Other Ambulatory Visit (INDEPENDENT_AMBULATORY_CARE_PROVIDER_SITE_OTHER): Payer: Self-pay | Admitting: Internal Medicine

## 2013-09-05 DIAGNOSIS — B1920 Unspecified viral hepatitis C without hepatic coma: Secondary | ICD-10-CM

## 2013-09-05 DIAGNOSIS — K7460 Unspecified cirrhosis of liver: Secondary | ICD-10-CM

## 2013-09-05 DIAGNOSIS — R188 Other ascites: Secondary | ICD-10-CM

## 2013-09-05 NOTE — Telephone Encounter (Signed)
abd tap sch'd 09/06/13 @ 1:15 (1:00), patient aware

## 2013-09-06 ENCOUNTER — Ambulatory Visit (HOSPITAL_COMMUNITY)
Admission: RE | Admit: 2013-09-06 | Discharge: 2013-09-06 | Disposition: A | Discharge status: Home / Self Care | Payer: BC Managed Care – PPO | Source: Ambulatory Visit | Attending: Internal Medicine | Admitting: Internal Medicine

## 2013-09-06 ENCOUNTER — Other Ambulatory Visit (INDEPENDENT_AMBULATORY_CARE_PROVIDER_SITE_OTHER): Payer: Self-pay | Admitting: Internal Medicine

## 2013-09-06 DIAGNOSIS — R188 Other ascites: Secondary | ICD-10-CM

## 2013-09-06 DIAGNOSIS — K7460 Unspecified cirrhosis of liver: Secondary | ICD-10-CM

## 2013-09-06 DIAGNOSIS — B1920 Unspecified viral hepatitis C without hepatic coma: Secondary | ICD-10-CM | POA: Insufficient documentation

## 2013-09-14 ENCOUNTER — Telehealth: Payer: Self-pay | Admitting: Neurology

## 2013-09-14 NOTE — Telephone Encounter (Signed)
Called the patient and discussed sleep study results.  Pt understands study did not reveal any obstructive sleep apnea but did show that the patient had oxygen desaturations that will qualify her for supplemental O2.  Dr. Brett Fairy recommends 1 lpm of O2 at night.  Pt was advised that this could be ordered and followed by her pcp.  A copy of this report will be sent to the referring provider.  The patient will receive the sleep study report via mail.

## 2013-10-07 ENCOUNTER — Ambulatory Visit (INDEPENDENT_AMBULATORY_CARE_PROVIDER_SITE_OTHER): Payer: BC Managed Care – PPO | Admitting: Internal Medicine

## 2013-10-11 ENCOUNTER — Emergency Department (HOSPITAL_COMMUNITY): Payer: BC Managed Care – PPO

## 2013-10-11 ENCOUNTER — Encounter (HOSPITAL_COMMUNITY): Payer: Self-pay | Admitting: Emergency Medicine

## 2013-10-11 ENCOUNTER — Inpatient Hospital Stay (HOSPITAL_COMMUNITY)
Admission: EM | Admit: 2013-10-11 | Discharge: 2013-10-17 | DRG: 372 | Disposition: A | Discharge status: Home Health | Payer: BC Managed Care – PPO | Attending: Internal Medicine | Admitting: Internal Medicine

## 2013-10-11 DIAGNOSIS — Z833 Family history of diabetes mellitus: Secondary | ICD-10-CM

## 2013-10-11 DIAGNOSIS — Z8249 Family history of ischemic heart disease and other diseases of the circulatory system: Secondary | ICD-10-CM | POA: Diagnosis not present

## 2013-10-11 DIAGNOSIS — Z9119 Patient's noncompliance with other medical treatment and regimen: Secondary | ICD-10-CM | POA: Diagnosis not present

## 2013-10-11 DIAGNOSIS — Z881 Allergy status to other antibiotic agents status: Secondary | ICD-10-CM

## 2013-10-11 DIAGNOSIS — R739 Hyperglycemia, unspecified: Secondary | ICD-10-CM

## 2013-10-11 DIAGNOSIS — F329 Major depressive disorder, single episode, unspecified: Secondary | ICD-10-CM | POA: Diagnosis present

## 2013-10-11 DIAGNOSIS — K769 Liver disease, unspecified: Secondary | ICD-10-CM | POA: Diagnosis present

## 2013-10-11 DIAGNOSIS — Z9089 Acquired absence of other organs: Secondary | ICD-10-CM

## 2013-10-11 DIAGNOSIS — F411 Generalized anxiety disorder: Secondary | ICD-10-CM | POA: Diagnosis present

## 2013-10-11 DIAGNOSIS — K7460 Unspecified cirrhosis of liver: Secondary | ICD-10-CM | POA: Diagnosis present

## 2013-10-11 DIAGNOSIS — IMO0001 Reserved for inherently not codable concepts without codable children: Secondary | ICD-10-CM | POA: Diagnosis present

## 2013-10-11 DIAGNOSIS — Z7682 Awaiting organ transplant status: Secondary | ICD-10-CM

## 2013-10-11 DIAGNOSIS — D61818 Other pancytopenia: Secondary | ICD-10-CM | POA: Diagnosis present

## 2013-10-11 DIAGNOSIS — K652 Spontaneous bacterial peritonitis: Secondary | ICD-10-CM | POA: Diagnosis present

## 2013-10-11 DIAGNOSIS — F3289 Other specified depressive episodes: Secondary | ICD-10-CM | POA: Diagnosis present

## 2013-10-11 DIAGNOSIS — G8929 Other chronic pain: Secondary | ICD-10-CM | POA: Diagnosis present

## 2013-10-11 DIAGNOSIS — Z79899 Other long term (current) drug therapy: Secondary | ICD-10-CM | POA: Diagnosis not present

## 2013-10-11 DIAGNOSIS — D696 Thrombocytopenia, unspecified: Secondary | ICD-10-CM

## 2013-10-11 DIAGNOSIS — B182 Chronic viral hepatitis C: Secondary | ICD-10-CM | POA: Diagnosis present

## 2013-10-11 DIAGNOSIS — Z91199 Patient's noncompliance with other medical treatment and regimen due to unspecified reason: Secondary | ICD-10-CM | POA: Diagnosis not present

## 2013-10-11 DIAGNOSIS — R188 Other ascites: Secondary | ICD-10-CM | POA: Diagnosis present

## 2013-10-11 DIAGNOSIS — E1165 Type 2 diabetes mellitus with hyperglycemia: Secondary | ICD-10-CM

## 2013-10-11 DIAGNOSIS — Z9884 Bariatric surgery status: Secondary | ICD-10-CM | POA: Diagnosis not present

## 2013-10-11 DIAGNOSIS — K219 Gastro-esophageal reflux disease without esophagitis: Secondary | ICD-10-CM | POA: Diagnosis present

## 2013-10-11 DIAGNOSIS — R109 Unspecified abdominal pain: Secondary | ICD-10-CM | POA: Diagnosis present

## 2013-10-11 DIAGNOSIS — Z87891 Personal history of nicotine dependence: Secondary | ICD-10-CM | POA: Diagnosis not present

## 2013-10-11 DIAGNOSIS — L299 Pruritus, unspecified: Secondary | ICD-10-CM | POA: Diagnosis present

## 2013-10-11 LAB — BODY FLUID CELL COUNT WITH DIFFERENTIAL
EOS FL: 0 %
LYMPHS FL: 2 %
MONOCYTE-MACROPHAGE-SEROUS FLUID: 5 % — AB (ref 50–90)
Neutrophil Count, Fluid: 93 % — ABNORMAL HIGH (ref 0–25)
Other Cells, Fluid: 0 %
Total Nucleated Cell Count, Fluid: 8008 cu mm — ABNORMAL HIGH (ref 0–1000)

## 2013-10-11 LAB — ALBUMIN, FLUID (OTHER): ALBUMIN FL: 0.6 g/dL

## 2013-10-11 LAB — AMMONIA: Ammonia: 55 umol/L (ref 11–60)

## 2013-10-11 LAB — BASIC METABOLIC PANEL
Anion gap: 14 (ref 5–15)
BUN: 12 mg/dL (ref 6–23)
CO2: 24 mEq/L (ref 19–32)
Calcium: 9 mg/dL (ref 8.4–10.5)
Chloride: 98 mEq/L (ref 96–112)
Creatinine, Ser: 0.86 mg/dL (ref 0.50–1.10)
GFR calc Af Amer: 88 mL/min — ABNORMAL LOW (ref >90)
GFR calc non Af Amer: 76 mL/min — ABNORMAL LOW (ref >90)
Glucose, Bld: 230 mg/dL — ABNORMAL HIGH (ref 70–99)
POTASSIUM: 3.8 meq/L (ref 3.7–5.3)
SODIUM: 136 meq/L — AB (ref 137–147)

## 2013-10-11 LAB — CBC WITH DIFFERENTIAL/PLATELET
BASOS ABS: 0 10*3/uL (ref 0.0–0.1)
BASOS PCT: 0 % (ref 0–1)
Eosinophils Absolute: 0 10*3/uL (ref 0.0–0.7)
Eosinophils Relative: 1 % (ref 0–5)
HCT: 30.8 % — ABNORMAL LOW (ref 36.0–46.0)
Hemoglobin: 9.9 g/dL — ABNORMAL LOW (ref 12.0–15.0)
Lymphocytes Relative: 4 % — ABNORMAL LOW (ref 12–46)
Lymphs Abs: 0.3 10*3/uL — ABNORMAL LOW (ref 0.7–4.0)
MCH: 26.5 pg (ref 26.0–34.0)
MCHC: 32.1 g/dL (ref 30.0–36.0)
MCV: 82.4 fL (ref 78.0–100.0)
MONO ABS: 0.3 10*3/uL (ref 0.1–1.0)
Monocytes Relative: 4 % (ref 3–12)
NEUTROS ABS: 6.4 10*3/uL (ref 1.7–7.7)
NEUTROS PCT: 91 % — AB (ref 43–77)
Platelets: 48 10*3/uL — ABNORMAL LOW (ref 150–400)
RBC: 3.74 MIL/uL — ABNORMAL LOW (ref 3.87–5.11)
RDW: 16.3 % — AB (ref 11.5–15.5)
WBC: 7 10*3/uL (ref 4.0–10.5)

## 2013-10-11 LAB — LACTATE DEHYDROGENASE, PLEURAL OR PERITONEAL FLUID: LD FL: 59 U/L — AB (ref 3–23)

## 2013-10-11 LAB — HEPATIC FUNCTION PANEL
ALK PHOS: 84 U/L (ref 39–117)
ALT: 34 U/L (ref 0–35)
AST: 31 U/L (ref 0–37)
Albumin: 3.2 g/dL — ABNORMAL LOW (ref 3.5–5.2)
BILIRUBIN DIRECT: 0.3 mg/dL (ref 0.0–0.3)
Indirect Bilirubin: 1 mg/dL — ABNORMAL HIGH (ref 0.3–0.9)
Total Bilirubin: 1.3 mg/dL — ABNORMAL HIGH (ref 0.3–1.2)
Total Protein: 6.5 g/dL (ref 6.0–8.3)

## 2013-10-11 LAB — PROTEIN, BODY FLUID: Total protein, fluid: 1.1 g/dL

## 2013-10-11 LAB — LIPASE, BLOOD: Lipase: 33 U/L (ref 11–59)

## 2013-10-11 LAB — LACTIC ACID, PLASMA: Lactic Acid, Venous: 2 mmol/L (ref 0.5–2.2)

## 2013-10-11 LAB — GLUCOSE, PERITONEAL FLUID: Glucose, Peritoneal Fluid: 199 mg/dL

## 2013-10-11 MED ORDER — ONDANSETRON HCL 4 MG/2ML IJ SOLN
4.0000 mg | Freq: Once | INTRAMUSCULAR | Status: AC
  Administered 2013-10-11: 4 mg via INTRAVENOUS
  Filled 2013-10-11: qty 2

## 2013-10-11 MED ORDER — CYCLOBENZAPRINE HCL 10 MG PO TABS: 10.0000 mg | ORAL_TABLET | Freq: Three times a day (TID) | ORAL | Status: DC | PRN

## 2013-10-11 MED ORDER — B COMPLEX PO TABS: 1.0000 | ORAL_TABLET | Freq: Every day | ORAL | Status: DC

## 2013-10-11 MED ORDER — LEVOFLOXACIN IN D5W 750 MG/150ML IV SOLN
750.0000 mg | Freq: Once | INTRAVENOUS | Status: AC
  Administered 2013-10-11: 750 mg via INTRAVENOUS
  Filled 2013-10-11: qty 150

## 2013-10-11 MED ORDER — HYDROMORPHONE HCL PF 1 MG/ML IJ SOLN
1.0000 mg | Freq: Once | INTRAMUSCULAR | Status: AC
Start: 2013-10-11 — End: 2013-10-11
  Administered 2013-10-11: 1 mg via INTRAVENOUS
  Filled 2013-10-11: qty 1

## 2013-10-11 MED ORDER — SPIRONOLACTONE 100 MG PO TABS
300.0000 mg | ORAL_TABLET | Freq: Every day | ORAL | Status: DC
  Administered 2013-10-12 – 2013-10-17 (×6): 300 mg via ORAL
  Filled 2013-10-11 (×8): qty 3

## 2013-10-11 MED ORDER — MORPHINE SULFATE 2 MG/ML IJ SOLN
2.0000 mg | INTRAMUSCULAR | Status: DC | PRN
  Administered 2013-10-11 – 2013-10-12 (×7): 2 mg via INTRAVENOUS
  Filled 2013-10-11 (×7): qty 1

## 2013-10-11 MED ORDER — LEVOFLOXACIN IN D5W 750 MG/150ML IV SOLN
750.0000 mg | INTRAVENOUS | Status: DC
  Administered 2013-10-12: 750 mg via INTRAVENOUS
  Filled 2013-10-11: qty 150

## 2013-10-11 MED ORDER — NADOLOL 20 MG PO TABS
10.0000 mg | ORAL_TABLET | Freq: Every morning | ORAL | Status: DC
  Administered 2013-10-12: 10 mg via ORAL
  Filled 2013-10-11 (×3): qty 1

## 2013-10-11 MED ORDER — FERROUS SULFATE 325 (65 FE) MG PO TABS
325.0000 mg | ORAL_TABLET | Freq: Two times a day (BID) | ORAL | Status: DC
  Administered 2013-10-12 – 2013-10-17 (×12): 325 mg via ORAL
  Filled 2013-10-11 (×12): qty 1

## 2013-10-11 MED ORDER — HYDROXYZINE HCL 25 MG PO TABS: 25.0000 mg | ORAL_TABLET | Freq: Four times a day (QID) | ORAL | Status: DC | PRN

## 2013-10-11 MED ORDER — HYDROMORPHONE HCL PF 1 MG/ML IJ SOLN
1.0000 mg | Freq: Once | INTRAMUSCULAR | Status: AC
  Administered 2013-10-11: 1 mg via INTRAVENOUS
  Filled 2013-10-11: qty 1

## 2013-10-11 MED ORDER — TRAZODONE HCL 50 MG PO TABS
50.0000 mg | ORAL_TABLET | Freq: Every day | ORAL | Status: DC
  Administered 2013-10-11 – 2013-10-16 (×6): 50 mg via ORAL
  Filled 2013-10-11 (×6): qty 1

## 2013-10-11 MED ORDER — ONDANSETRON HCL 4 MG/2ML IJ SOLN
4.0000 mg | Freq: Four times a day (QID) | INTRAMUSCULAR | Status: DC | PRN
  Administered 2013-10-13 – 2013-10-16 (×10): 4 mg via INTRAVENOUS
  Filled 2013-10-11 (×11): qty 2

## 2013-10-11 MED ORDER — SULFAMETHOXAZOLE-TMP DS 800-160 MG PO TABS
1.0000 | ORAL_TABLET | Freq: Every day | ORAL | Status: DC
  Administered 2013-10-12 – 2013-10-15 (×4): 1 via ORAL
  Filled 2013-10-11 (×4): qty 1

## 2013-10-11 MED ORDER — PANTOPRAZOLE SODIUM 40 MG PO TBEC
40.0000 mg | DELAYED_RELEASE_TABLET | Freq: Every day | ORAL | Status: DC
  Administered 2013-10-12 – 2013-10-17 (×6): 40 mg via ORAL
  Filled 2013-10-11 (×6): qty 1

## 2013-10-11 MED ORDER — RIFAXIMIN 550 MG PO TABS
550.0000 mg | ORAL_TABLET | Freq: Two times a day (BID) | ORAL | Status: DC
  Administered 2013-10-11 – 2013-10-17 (×12): 550 mg via ORAL
  Filled 2013-10-11 (×12): qty 1

## 2013-10-11 MED ORDER — FUROSEMIDE 80 MG PO TABS
80.0000 mg | ORAL_TABLET | Freq: Every day | ORAL | Status: DC
  Administered 2013-10-12: 80 mg via ORAL
  Filled 2013-10-11: qty 1

## 2013-10-11 MED ORDER — BACLOFEN 10 MG PO TABS
10.0000 mg | ORAL_TABLET | Freq: Two times a day (BID) | ORAL | Status: DC
  Administered 2013-10-11 – 2013-10-17 (×12): 10 mg via ORAL
  Filled 2013-10-11 (×12): qty 1

## 2013-10-11 MED ORDER — LACTULOSE 10 GM/15ML PO SOLN
30.0000 g | Freq: Three times a day (TID) | ORAL | Status: DC
  Administered 2013-10-14: 30 g via ORAL
  Filled 2013-10-11 (×5): qty 60

## 2013-10-11 MED ORDER — SODIUM CHLORIDE 0.9 % IV SOLN
INTRAVENOUS | Status: DC
  Administered 2013-10-11: 16:00:00 via INTRAVENOUS

## 2013-10-11 MED ORDER — ONDANSETRON HCL 4 MG PO TABS
4.0000 mg | ORAL_TABLET | Freq: Four times a day (QID) | ORAL | Status: DC | PRN
  Administered 2013-10-14: 4 mg via ORAL
  Filled 2013-10-11: qty 1

## 2013-10-11 MED ORDER — ADULT MULTIVITAMIN W/MINERALS CH
1.0000 | ORAL_TABLET | Freq: Every day | ORAL | Status: DC
  Administered 2013-10-12 – 2013-10-17 (×6): 1 via ORAL
  Filled 2013-10-11 (×6): qty 1

## 2013-10-11 NOTE — H&P (Signed)
[facility] History and Physical  [name] [id number] DOB: [birth year]    PCP:   Glo Herring., MD   Chief Complaint: abdominal pain and distention this am.  HPI: 53 year old female with a history of chronic hepatitis C with hepatic cirrhosis, portal vein thrombosis, esophageal varices presents with one-day history of excruciating abdominal pain. The patient states that she had an episode of spontaneous bacterial peritonitis (SBP) and was admitted in April.  She has been on Bactrim DS as prophylaxis.  The patient states that she last followed up with Dr. Laural Golden and has been on the dual liver transplant list. She endorses compliance with all her medications.  Patient complains of subjective fevers and chills but denies any chest pain, shortness breath, vomiting, diarrhea, hematochezia, melena, hematemesis, headache, dizziness. There is no dysuria or hematuria. In the emergency department, the patient had a paracentesis under [place] by radiology and 1.3 L of fluid was removed. . Ascites showed WBC 8000 with 93% neutrophils.Because she has cephalosporin allergy, she was given IV Levoquin.  Hospitalist was asked to admit her for SBP.  Rewiew of Systems:  Constitutional: Negative for malaise No significant weight loss or weight gain Eyes: Negative for eye pain, redness and discharge, diplopia, visual changes, or flashes of light. ENMT: Negative for ear pain, hoarseness, nasal congestion, sinus pressure and sore throat. No headaches; tinnitus, drooling, or problem swallowing. Cardiovascular: Negative for chest pain, palpitations, diaphoresis, dyspnea and peripheral edema. ; No orthopnea, PND Respiratory: Negative for cough, hemoptysis, wheezing and stridor. No pleuritic chestpain. Gastrointestinal: Negative for nausea, vomiting, diarrhea, constipation, melena, blood in stool, hematemesis, jaundice and rectal bleeding.    Genitourinary: Negative for frequency, dysuria, incontinence,flank pain  and hematuria; Musculoskeletal: Negative for back pain and neck pain. Negative for swelling and trauma.;  Skin: . Negative for pruritus, rash, abrasions, bruising and skin lesion.; ulcerations Neuro: Negative for headache, lightheadedness and neck stiffness. Negative for weakness, altered level of consciousness , altered mental status, extremity weakness, burning feet, involuntary movement, seizure and syncope.  Psych: negative for anxiety, depression, insomnia, tearfulness, panic attacks, hallucinations, paranoia, suicidal or homicidal ideation    Past Medical History  Diagnosis Date  . Esophageal varices   . Cirrhosis     non alcoholic  . Ascites   . Anemia   . Liver failure     "I'm on liver transplant list @ [facility]" (11/07/2012)  . Portal vein thrombosis   . Complication of anesthesia   . PONV (postoperative nausea and vomiting)   . Heart murmur   . Type II diabetes mellitus     "not since gastric bypass" (11/07/2012)  . Hepatitis C     Hx: of Hep "C" it was eradicated  . [id number]     "monthly" (11/07/2012)  . Anxiety   . Depression   . Kidney stones     Past Surgical History  Procedure Laterality Date  . Abdominal hysterectomy  2003  . Upper gastrointestinal endoscopy    . Colonoscopy    . Gastric bypass  ~ 1995  . Cesarean section  1995  . Lithotripsy      "several times" (11/07/2012)  . Knee arthroscopy Right   . Colonoscopy  10/21/2011    Procedure: COLONOSCOPY;  Surgeon: Rogene Houston, MD;  [facility];  Service: Endoscopy;  Laterality: N/A;  245   . Cystoscopy with stent placement  12/08/2011    Procedure: CYSTOSCOPY WITH STENT PLACEMENT;  Surgeon: Marissa Nestle, MD;  Location: [facility];  Service: Urology;  Laterality: Right;  . Cystoscopy w/ retrogrades  12/08/2011    Procedure: CYSTOSCOPY WITH RETROGRADE PYELOGRAM;  Surgeon: Marissa Nestle, MD;  Location: [facility];  Service: Urology;  Laterality: Right;  . Dilation and curettage of uterus     . Orif distal radius fracture Right 11/07/2012  . Cholecystectomy  1987  . Hernia repair  04/14/11    "one in my bellybutton" (11/07/2012)  . Inguinal hernia repair Right     "maybe 2" (11/07/2012)  . Open reduction internal fixation (orif) distal radial fracture Right 11/07/2012    Procedure: OPEN REDUCTION INTERNAL FIXATION (ORIF) RIGHT DISTAL RADIAL FRACTURE;  Surgeon: [name], MD;  Location: [place];  Service: Orthopedics;  Laterality: Right;    Medications:  HOME MEDS: Prior to Admission medications   Medication Sig Start Date End Date Taking? Authorizing Provider  b complex vitamins tablet Take 1 tablet by mouth daily.   Yes Historical Provider, MD  baclofen (LIORESAL) 10 MG tablet Take 10 mg by mouth 2 (two) times daily.    Yes Historical Provider, MD  cyclobenzaprine (FLEXERIL) 10 MG tablet Take 10 mg by mouth 3 (three) times daily as needed for muscle spasms. As needed 07/08/13  Yes Historical Provider, MD  ferrous sulfate 325 (65 FE) MG tablet Take 325 mg by mouth 2 (two) times daily with a meal.   Yes Historical Provider, MD  furosemide (LASIX) 40 MG tablet Take 80 mg by mouth daily.    Yes Historical Provider, MD  hydrOXYzine (ATARAX/VISTARIL) 25 MG tablet Take 25 mg by mouth daily as needed. For itchinng 09/04/13  Yes Historical Provider, MD  lactulose (CHRONULAC) 10 GM/15ML solution Take 45 mLs (30 g total) by mouth 3 (three) times daily. 06/03/13  Yes Erline Hau, MD  Multiple Vitamin (MULTIVITAMIN WITH MINERALS) TABS tablet Take 1 tablet by mouth daily.   Yes Historical Provider, MD  nadolol (CORGARD) 20 MG tablet Take 10 mg by mouth every morning.    Yes Historical Provider, MD  omeprazole (PRILOSEC) 20 MG capsule Take 1 capsule (20 mg total) by mouth 2 (two) times daily before a meal. 12/24/12  Yes Rogene Houston, MD  rifaximin (XIFAXAN) 550 MG TABS Take 550 mg by mouth 2 (two) times daily.   Yes Historical Provider, MD  spironolactone (ALDACTONE) 100 MG  tablet Take 300 mg by mouth daily.    Yes Historical Provider, MD  sulfamethoxazole-trimethoprim (BACTRIM DS) 800-160 MG per tablet Take 1 tablet by mouth daily.   Yes Historical Provider, MD  traZODone (DESYREL) 50 MG tablet Take 1 tablet (50 mg total) by mouth at bedtime. 09/25/12  Yes Rogene Houston, MD     Allergies:  Allergies  Allergen Reactions  . Ancef [Cefazolin Sodium] Other (See Comments)    "Blisters in mouth and vagina"  . Cefazolin Rash    Blisters in mouth and vagina Blisters in mouth and vagina    Social History:   reports that she quit smoking about 22 years ago. Her smoking use included Cigarettes. She has a 2.5 pack-year smoking history. She has never used smokeless tobacco. She reports that she does not drink alcohol or use illicit drugs.  Family History: Family History  Problem Relation Age of Onset  . Dementia Mother   . Heart disease Father   . Liver disease Father   . Hypertension Father   . Diabetes Father   . Dementia Father   . Hypertension Brother   .  Other Son     Cervical dystonia     Physical Exam: Filed Vitals:   10/11/13 2000 10/11/13 2030 10/11/13 2100 10/11/13 2130  BP: 99/84 107/64 114/67 116/57  Pulse: 103 96 98 98  Temp:      TempSrc:      Resp:      Height:      Weight:      SpO2: 90% 93% 91% 94%   Blood pressure 116/57, pulse 98, temperature 99.3 F (37.4 C), temperature source Oral, resp. rate 18, height 5\' 6"  (1.676 m), weight 66.679 kg (147 lb), SpO2 94.00%.  GEN:  Pleasant  patient lying in the stretcher in no acute distress; cooperative with exam. PSYCH:  alert and oriented x4; does not appear anxious or depressed; affect is appropriate. HEENT: Mucous membranes pink and anicteric; PERRLA; EOM intact; no cervical lymphadenopathy nor thyromegaly or carotid bruit; no JVD; There were no stridor. Neck is very supple. Breasts:: Not examined CHEST WALL: No tenderness CHEST: Normal respiration, clear to auscultation  bilaterally.  HEART: Regular rate and rhythm.  There are no murmur, rub, or gallops.   BACK: No kyphosis or scoliosis; no CVA tenderness ABDOMEN: soft but is tender, no masses, no organomegaly, normal abdominal bowel sounds; no pannus; no intertriginous candida. There is no rebound and no distention. Rectal Exam: Not done EXTREMITIES: No bone or joint deformity; age-appropriate arthropathy of the hands and knees; no edema; no ulcerations.  There is no calf tenderness. Genitalia: not examined PULSES: 2+ and symmetric SKIN: Normal hydration no rash or ulceration CNS: Cranial nerves 2-12 grossly intact no focal lateralizing neurologic deficit.  Speech is fluent; uvula elevated with phonation, facial symmetry and tongue midline. DTR are normal bilaterally, cerebella exam is intact, barbinski is negative and strengths are equaled bilaterally.  No sensory loss.   Labs on Admission:  Basic Metabolic Panel:  Recent Labs Lab 10/11/13 1525  NA 136*  K 3.8  CL 98  CO2 24  GLUCOSE 230*  BUN 12  CREATININE 0.86  CALCIUM 9.0   Liver Function Tests:  Recent Labs Lab 10/11/13 1620  AST 31  ALT 34  ALKPHOS 84  BILITOT 1.3*  PROT 6.5  ALBUMIN 3.2*    Recent Labs Lab 10/11/13 1620  LIPASE 33    Recent Labs Lab 10/11/13 1620  AMMONIA 55   CBC:  Recent Labs Lab 10/11/13 1525  WBC 7.0  NEUTROABS 6.4  HGB 9.9*  HCT 30.8*  MCV 82.4  PLT 48*    Radiological Exams on Admission: Dg Chest Port 1 View  10/11/2013   CLINICAL DATA:  Chest pain  EXAM: PORTABLE CHEST - 1 VIEW  COMPARISON:  Chest radiograph 11/07/2012  FINDINGS: Stable cardiac and mediastinal contours. No consolidative pulmonary opacities. No pleural effusion or pneumothorax. Regional skeleton unremarkable.  IMPRESSION: No acute cardiopulmonary process.   Electronically Signed   By: Lovey Newcomer M.D.   On: 10/11/2013 16:13   Assessment/Plan Present on Admission:  . Chronic hepatitis C . Cirrhosis of liver .  Ascites . SBP (spontaneous bacterial peritonitis)  PLAN:  Will admit her for SBP with hx of hep C, cirrhosis, and ascites.  Since she has been on Bactrim and is allergic to cephalosporin, will give IV Levaquin.  Will continue her Bactrim for now.  She is thrombocytopenic, and bactrim may suppress it even further.  Will hold DVT heparin, and use SCD.  She is otherwise stable, full code, and will be admitted to [facility] service.  Thank you for allowing me to partake in her care.   Other plans as per orders.  Code Status: FULL Haskel Khan, MD. [facility] Pager [phone] 7pm to 7am.  10/11/2013, 10:23 PM

## 2013-10-11 NOTE — ED Provider Notes (Signed)
CSN: [id number]     Arrival date & time 10/11/13  1428 History   First MD Initiated Contact with Patient 10/11/13 1510     Chief Complaint  Patient presents with  . Abdominal Pain     (Consider location/radiation/quality/duration/timing/severity/associated sxs/prior Treatment) Patient is a 53 y.o. female presenting with abdominal pain. The history is provided by the patient.  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 Patient with cirrhosis and ascites secondary to hepatitis C. Patient with marked distention of the abdomen and diffuse tenderness today. Patient had some lower back pain last evening. Patient feels like she's had a fever and chills. She's had nausea no vomiting has had some loose bowel movements. Patient thinks that she's got spontaneous bacterial peritonitis and also needs to have the ascites fluid drained. Patient's primary care doctor is Dr. Riley Kill and GI Dr. is Dr. Laural Golden.  Past Medical History  Diagnosis Date  . Esophageal varices   . Cirrhosis     non alcoholic  . Ascites   . Anemia   . Liver failure     "I'm on liver transplant list @ [facility]" (11/07/2012)  . Portal vein thrombosis   . Complication of anesthesia   . PONV (postoperative nausea and vomiting)   . Heart murmur   . Type II diabetes mellitus     "not since gastric bypass" (11/07/2012)  . Hepatitis C     Hx: of Hep "C" it was eradicated  . [id number]     "monthly" (11/07/2012)  . Anxiety   . Depression   . Kidney stones    Past Surgical History  Procedure Laterality Date  . Abdominal hysterectomy  2003  . Upper gastrointestinal endoscopy    . Colonoscopy    . Gastric bypass  ~ 1995  . Cesarean section  1995  . Lithotripsy      "several times" (11/07/2012)  . Knee arthroscopy Right   . Colonoscopy   10/21/2011    Procedure: COLONOSCOPY;  Surgeon: Rogene Houston, MD;  [facility];  Service: Endoscopy;  Laterality: N/A;  245   . Cystoscopy with stent placement  12/08/2011    Procedure: CYSTOSCOPY WITH STENT PLACEMENT;  Surgeon: Marissa Nestle, MD;  Location: [facility];  Service: Urology;  Laterality: Right;  . Cystoscopy w/ retrogrades  12/08/2011    Procedure: CYSTOSCOPY WITH RETROGRADE PYELOGRAM;  Surgeon: Marissa Nestle, MD;  Location: [facility];  Service: Urology;  Laterality: Right;  . Dilation and curettage of uterus    . Orif distal radius fracture Right 11/07/2012  . Cholecystectomy  1987  . Hernia repair  04/14/11    "one in my bellybutton" (11/07/2012)  . Inguinal hernia repair Right     "maybe 2" (11/07/2012)  . Open reduction internal fixation (orif) distal radial fracture Right 11/07/2012    Procedure: OPEN REDUCTION INTERNAL FIXATION (ORIF) RIGHT DISTAL RADIAL FRACTURE;  Surgeon: [name], MD;  Location: [place];  Service: Orthopedics;  Laterality: Right;   Family History  Problem Relation Age of Onset  . Dementia Mother   . Heart disease Father   . Liver disease Father   . Hypertension Father   . Diabetes Father   . Dementia Father   . Hypertension Brother   . Other Son  Cervical dystonia   History  Substance Use Topics  . Smoking status: Former Smoker -- 0.50 packs/day for 5 years    Types: Cigarettes    Quit date: 05/02/1991  . Smokeless tobacco: Never Used  . Alcohol Use: No   OB History   Grav Para Term Preterm Abortions TAB SAB Ect Mult Living                 Review of Systems  Constitutional: Positive for fever and chills.  HENT: Negative for congestion.   Eyes: Negative for visual disturbance.  Respiratory: Negative for shortness of breath.   Cardiovascular: Negative for chest pain.  Gastrointestinal: Positive for nausea, abdominal pain, diarrhea and abdominal distention. Negative for vomiting.  Genitourinary: Negative for  dysuria.  Musculoskeletal: Positive for back pain.  Skin: Negative for rash.  Allergic/Immunologic: Positive for immunocompromised state.  Hematological: Does not bruise/bleed easily.  Psychiatric/Behavioral: Negative for confusion.      Allergies  Ancef and Cefazolin  Home Medications   Prior to Admission medications   Medication Sig Start Date End Date Taking? Authorizing Provider  b complex vitamins tablet Take 1 tablet by mouth daily.   Yes Historical Provider, MD  baclofen (LIORESAL) 10 MG tablet Take 10 mg by mouth 2 (two) times daily.    Yes Historical Provider, MD  cyclobenzaprine (FLEXERIL) 10 MG tablet Take 10 mg by mouth 3 (three) times daily as needed for muscle spasms. As needed 07/08/13  Yes Historical Provider, MD  ferrous sulfate 325 (65 FE) MG tablet Take 325 mg by mouth 2 (two) times daily with a meal.   Yes Historical Provider, MD  furosemide (LASIX) 40 MG tablet Take 80 mg by mouth daily.    Yes Historical Provider, MD  hydrOXYzine (ATARAX/VISTARIL) 25 MG tablet Take 25 mg by mouth daily as needed. For itchinng 09/04/13  Yes Historical Provider, MD  lactulose (CHRONULAC) 10 GM/15ML solution Take 45 mLs (30 g total) by mouth 3 (three) times daily. 06/03/13  Yes Erline Hau, MD  Multiple Vitamin (MULTIVITAMIN WITH MINERALS) TABS tablet Take 1 tablet by mouth daily.   Yes Historical Provider, MD  nadolol (CORGARD) 20 MG tablet Take 10 mg by mouth every morning.    Yes Historical Provider, MD  omeprazole (PRILOSEC) 20 MG capsule Take 1 capsule (20 mg total) by mouth 2 (two) times daily before a meal. 12/24/12  Yes Rogene Houston, MD  rifaximin (XIFAXAN) 550 MG TABS Take 550 mg by mouth 2 (two) times daily.   Yes Historical Provider, MD  spironolactone (ALDACTONE) 100 MG tablet Take 300 mg by mouth daily.    Yes Historical Provider, MD  sulfamethoxazole-trimethoprim (BACTRIM DS) 800-160 MG per tablet Take 1 tablet by mouth daily.   Yes Historical Provider, MD   traZODone (DESYREL) 50 MG tablet Take 1 tablet (50 mg total) by mouth at bedtime. 09/25/12  Yes Rogene Houston, MD   BP 107/64  Pulse 96  Temp(Src) 99.3 F (37.4 C) (Oral)  Resp 18  Ht 5\' 6"  (1.676 m)  Wt 147 lb (66.679 kg)  BMI 23.74 kg/m2  SpO2 93% Physical Exam  Nursing note and vitals reviewed. Constitutional: She is oriented to person, place, and time. She appears well-developed and well-nourished. No distress.  HENT:  Head: Normocephalic and atraumatic.  Mouth/Throat: Oropharynx is clear and moist.  Eyes: Conjunctivae and EOM are normal. Pupils are equal, round, and reactive to light.  Neck: Normal range of motion.  Cardiovascular: Normal rate  and regular rhythm.   No murmur heard. Abdominal: Soft. Bowel sounds are normal. She exhibits distension. There is tenderness. There is guarding.  Patient with diffuse abdominal distention and tenderness with guarding.  Musculoskeletal: Normal range of motion. She exhibits no edema.  Neurological: She is alert and oriented to person, place, and time. No cranial nerve deficit. She exhibits normal muscle tone. Coordination normal.  Skin: Skin is warm. No rash noted.  Very warm to the touch. Mostly over the abdomen.    ED Course  Procedures (including critical care time) Labs Review Labs Reviewed  CBC WITH DIFFERENTIAL - Abnormal; Notable for the following:    RBC 3.74 (*)    Hemoglobin 9.9 (*)    HCT 30.8 (*)    RDW 16.3 (*)    Platelets 48 (*)    Neutrophils Relative % 91 (*)    Lymphocytes Relative 4 (*)    Lymphs Abs 0.3 (*)    All other components within normal limits  BASIC METABOLIC PANEL - Abnormal; Notable for the following:    Sodium 136 (*)    Glucose, Bld 230 (*)    GFR calc non Af Amer 76 (*)    GFR calc Af Amer 88 (*)    All other components within normal limits  LACTATE DEHYDROGENASE, BODY FLUID - Abnormal; Notable for the following:    LD, Fluid 59 (*)    All other components within normal limits  HEPATIC  FUNCTION PANEL - Abnormal; Notable for the following:    Albumin 3.2 (*)    Total Bilirubin 1.3 (*)    Indirect Bilirubin 1.0 (*)    All other components within normal limits  BODY FLUID CELL COUNT WITH DIFFERENTIAL - Abnormal; Notable for the following:    Appearance, Fluid CLOUDY (*)    WBC, Fluid 8008 (*)    Neutrophil Count, Fluid 93 (*)    Monocyte-Macrophage-Serous Fluid 5 (*)    All other components within normal limits  CULTURE, BLOOD (ROUTINE X 2)  CULTURE, BLOOD (ROUTINE X 2)  BODY FLUID CULTURE  LACTIC ACID, PLASMA  LIPASE, BLOOD  AMMONIA  GLUCOSE, PERITONEAL FLUID  PROTEIN, BODY FLUID  ALBUMIN, FLUID  PATHOLOGIST SMEAR REVIEW   Results for orders placed during the hospital encounter of 10/11/13  CBC WITH DIFFERENTIAL      Result Value Ref Range   WBC 7.0  4.0 - 10.5 K/uL   RBC 3.74 (*) 3.87 - 5.11 MIL/uL   Hemoglobin 9.9 (*) 12.0 - 15.0 g/dL   HCT 30.8 (*) 36.0 - 46.0 %   MCV 82.4  78.0 - 100.0 fL   MCH 26.5  26.0 - 34.0 pg   MCHC 32.1  30.0 - 36.0 g/dL   RDW 16.3 (*) 11.5 - 15.5 %   Platelets 48 (*) 150 - 400 K/uL   Neutrophils Relative % 91 (*) 43 - 77 %   Neutro Abs 6.4  1.7 - 7.7 K/uL   Lymphocytes Relative 4 (*) 12 - 46 %   Lymphs Abs 0.3 (*) 0.7 - 4.0 K/uL   Monocytes Relative 4  3 - 12 %   Monocytes Absolute 0.3  0.1 - 1.0 K/uL   Eosinophils Relative 1  0 - 5 %   Eosinophils Absolute 0.0  0.0 - 0.7 K/uL   Basophils Relative 0  0 - 1 %   Basophils Absolute 0.0  0.0 - 0.1 K/uL   Smear Review SPECIMEN CHECKED FOR CLOTS    BASIC METABOLIC PANEL  Result Value Ref Range   Sodium 136 (*) 137 - 147 mEq/L   Potassium 3.8  3.7 - 5.3 mEq/L   Chloride 98  96 - 112 mEq/L   CO2 24  19 - 32 mEq/L   Glucose, Bld 230 (*) 70 - 99 mg/dL   BUN 12  6 - 23 mg/dL   Creatinine, Ser 0.86  0.50 - 1.10 mg/dL   Calcium 9.0  8.4 - 10.5 mg/dL   GFR calc non Af Amer 76 (*) >90 mL/min   GFR calc Af Amer 88 (*) >90 mL/min   Anion gap 14  5 - 15  LACTIC ACID, PLASMA       Result Value Ref Range   Lactic Acid, Venous 2.0  0.5 - 2.2 mmol/L  LIPASE, BLOOD      Result Value Ref Range   Lipase 33  11 - 59 U/L  AMMONIA      Result Value Ref Range   Ammonia 55  11 - 60 umol/L  LACTATE DEHYDROGENASE, BODY FLUID      Result Value Ref Range   LD, Fluid 59 (*) 3 - 23 U/L   Fluid Type-FLDH PERITONEAL    GLUCOSE, PERITONEAL FLUID      Result Value Ref Range   Glucose, Peritoneal Fluid 199    PROTEIN, BODY FLUID      Result Value Ref Range   Total protein, fluid 1.1     Fluid Type-FTP PERITONEAL    ALBUMIN, FLUID      Result Value Ref Range   Albumin, Fluid 0.6     Fluid Type-FALB PERITONEAL    HEPATIC FUNCTION PANEL      Result Value Ref Range   Total Protein 6.5  6.0 - 8.3 g/dL   Albumin 3.2 (*) 3.5 - 5.2 g/dL   AST 31  0 - 37 U/L   ALT 34  0 - 35 U/L   Alkaline Phosphatase 84  39 - 117 U/L   Total Bilirubin 1.3 (*) 0.3 - 1.2 mg/dL   Bilirubin, Direct 0.3  0.0 - 0.3 mg/dL   Indirect Bilirubin 1.0 (*) 0.3 - 0.9 mg/dL  BODY FLUID CELL COUNT WITH DIFFERENTIAL      Result Value Ref Range   Fluid Type-FCT PERITONEAL     Color, Fluid PINK     Appearance, Fluid CLOUDY (*) CLEAR   WBC, Fluid 8008 (*) 0 - 1000 cu mm   Neutrophil Count, Fluid 93 (*) 0 - 25 %   Lymphs, Fluid 2     Monocyte-Macrophage-Serous Fluid 5 (*) 50 - 90 %   Eos, Fluid 0     Other Cells, Fluid 0       Imaging Review Dg Chest Port 1 View  10/11/2013   CLINICAL DATA:  Chest pain  EXAM: PORTABLE CHEST - 1 VIEW  COMPARISON:  Chest radiograph 11/07/2012  FINDINGS: Stable cardiac and mediastinal contours. No consolidative pulmonary opacities. No pleural effusion or pneumothorax. Regional skeleton unremarkable.  IMPRESSION: No acute cardiopulmonary process.   Electronically Signed   By: Lovey Newcomer M.D.   On: 10/11/2013 16:13     EKG Interpretation None      MDM   Final diagnoses:  SBP (spontaneous bacterial peritonitis)  Ascites    Patient's presentation clinically is  consistent with spontaneous bacterial peritonitis. Patient with fever abdominal tenderness diffusely. Patient's ascites fluid was tapped by ultrasound by radiology fluid sent for analysis. They drew off a liter and a half of  fluid. Patient's white blood cell count peritoneal fluid is greater than 1000. The polys are less than 250. LDH is elevated that can also be elevated with cirrhosis. Patient's cause of cirrhosis his hepatitis C. Patient also has been on Septra for the past several days as prophylaxis. Patient is allergic to cephalosporins and penicillins. The patient treated the with Levaquin IV. Patient has not been on any for prolonged lately. We'll discuss with hospitalist about admission.    Fredia Sorrow, MD 10/11/13 2112

## 2013-10-11 NOTE — ED Notes (Signed)
Mid back pain began last night.  This morning back pain was gone, but her abdomen was severely hurting.  She has liver failure and has had bouts w/peritonitis and states this feels the same.  She has chills, diarrhea and nausea w/out vomiting.

## 2013-10-11 NOTE — ED Notes (Signed)
Second set of blood cultures being drawn by lab at this time.

## 2013-10-11 NOTE — ED Notes (Signed)
Dr. Thornton Papas at bedside at this time.

## 2013-10-12 LAB — BASIC METABOLIC PANEL
Anion gap: 10 (ref 5–15)
BUN: 13 mg/dL (ref 6–23)
CO2: 24 meq/L (ref 19–32)
CREATININE: 0.81 mg/dL (ref 0.50–1.10)
Calcium: 7.9 mg/dL — ABNORMAL LOW (ref 8.4–10.5)
Chloride: 99 mEq/L (ref 96–112)
GFR, EST NON AFRICAN AMERICAN: 82 mL/min — AB (ref >90)
Glucose, Bld: 243 mg/dL — ABNORMAL HIGH (ref 70–99)
Potassium: 3.8 mEq/L (ref 3.7–5.3)
SODIUM: 133 meq/L — AB (ref 137–147)

## 2013-10-12 LAB — CBC WITH DIFFERENTIAL/PLATELET
BASOS ABS: 0 10*3/uL (ref 0.0–0.1)
BASOS ABS: 0 10*3/uL (ref 0.0–0.1)
BASOS PCT: 0 % (ref 0–1)
Basophils Relative: 0 % (ref 0–1)
Eosinophils Absolute: 0 10*3/uL (ref 0.0–0.7)
Eosinophils Absolute: 0.1 10*3/uL (ref 0.0–0.7)
Eosinophils Relative: 1 % (ref 0–5)
Eosinophils Relative: 1 % (ref 0–5)
HCT: 26 % — ABNORMAL LOW (ref 36.0–46.0)
HEMATOCRIT: 27.9 % — AB (ref 36.0–46.0)
HEMOGLOBIN: 8.9 g/dL — AB (ref 12.0–15.0)
Hemoglobin: 8.3 g/dL — ABNORMAL LOW (ref 12.0–15.0)
LYMPHS PCT: 7 % — AB (ref 12–46)
LYMPHS PCT: 9 % — AB (ref 12–46)
Lymphs Abs: 0.4 10*3/uL — ABNORMAL LOW (ref 0.7–4.0)
Lymphs Abs: 0.5 10*3/uL — ABNORMAL LOW (ref 0.7–4.0)
MCH: 26.4 pg (ref 26.0–34.0)
MCH: 26.6 pg (ref 26.0–34.0)
MCHC: 31.9 g/dL (ref 30.0–36.0)
MCHC: 31.9 g/dL (ref 30.0–36.0)
MCV: 82.8 fL (ref 78.0–100.0)
MCV: 83.3 fL (ref 78.0–100.0)
MONO ABS: 0.4 10*3/uL (ref 0.1–1.0)
MONOS PCT: 6 % (ref 3–12)
Monocytes Absolute: 0.3 10*3/uL (ref 0.1–1.0)
Monocytes Relative: 8 % (ref 3–12)
NEUTROS ABS: 4.4 10*3/uL (ref 1.7–7.7)
NEUTROS ABS: 4.3 10*3/uL (ref 1.7–7.7)
NEUTROS PCT: 84 % — AB (ref 43–77)
Neutrophils Relative %: 83 % — ABNORMAL HIGH (ref 43–77)
PLATELETS: 36 10*3/uL — AB (ref 150–400)
Platelets: 40 10*3/uL — ABNORMAL LOW (ref 150–400)
RBC: 3.14 MIL/uL — ABNORMAL LOW (ref 3.87–5.11)
RBC: 3.35 MIL/uL — ABNORMAL LOW (ref 3.87–5.11)
RDW: 16.5 % — ABNORMAL HIGH (ref 11.5–15.5)
RDW: 16.7 % — ABNORMAL HIGH (ref 11.5–15.5)
WBC: 5.2 10*3/uL (ref 4.0–10.5)
WBC: 5.2 10*3/uL (ref 4.0–10.5)

## 2013-10-12 MED ORDER — MORPHINE SULFATE 2 MG/ML IJ SOLN
2.0000 mg | INTRAMUSCULAR | Status: DC | PRN
  Administered 2013-10-12 (×2): 3 mg via INTRAVENOUS
  Administered 2013-10-12: 2 mg via INTRAVENOUS
  Administered 2013-10-13 (×2): 3 mg via INTRAVENOUS
  Administered 2013-10-13: 2 mg via INTRAVENOUS
  Administered 2013-10-13 (×2): 3 mg via INTRAVENOUS
  Administered 2013-10-14 – 2013-10-15 (×7): 2 mg via INTRAVENOUS
  Administered 2013-10-15 – 2013-10-16 (×4): 3 mg via INTRAVENOUS
  Filled 2013-10-12 (×3): qty 1
  Filled 2013-10-12 (×2): qty 2
  Filled 2013-10-12: qty 1
  Filled 2013-10-12: qty 2
  Filled 2013-10-12: qty 1
  Filled 2013-10-12 (×4): qty 2
  Filled 2013-10-12: qty 1
  Filled 2013-10-12 (×2): qty 2
  Filled 2013-10-12: qty 1
  Filled 2013-10-12: qty 2
  Filled 2013-10-12: qty 1
  Filled 2013-10-12: qty 2

## 2013-10-12 MED ORDER — ALBUMIN HUMAN 25 % IV SOLN
INTRAVENOUS | Status: AC
  Filled 2013-10-12: qty 250

## 2013-10-12 MED ORDER — ALBUMIN HUMAN 25 % IV SOLN: 75.0000 g | Freq: Once | INTRAVENOUS | Status: DC

## 2013-10-12 MED ORDER — MIDODRINE HCL 5 MG PO TABS
5.0000 mg | ORAL_TABLET | Freq: Three times a day (TID) | ORAL | Status: DC
  Administered 2013-10-12 – 2013-10-17 (×16): 5 mg via ORAL
  Filled 2013-10-12 (×16): qty 1

## 2013-10-12 MED ORDER — ALBUMIN HUMAN 25 % IV SOLN
75.0000 g | Freq: Two times a day (BID) | INTRAVENOUS | Status: AC
  Administered 2013-10-12 – 2013-10-13 (×2): 75 g via INTRAVENOUS
  Filled 2013-10-12 (×2): qty 300

## 2013-10-12 MED ORDER — FUROSEMIDE 80 MG PO TABS
80.0000 mg | ORAL_TABLET | Freq: Every day | ORAL | Status: DC
  Administered 2013-10-13 – 2013-10-17 (×5): 80 mg via ORAL
  Filled 2013-10-12 (×5): qty 1

## 2013-10-12 MED ORDER — FUROSEMIDE 80 MG PO TABS: 120.0000 mg | ORAL_TABLET | Freq: Every day | ORAL | Status: DC

## 2013-10-12 NOTE — Progress Notes (Signed)
Utilization review Completed Vonnie Ligman RN BSN   

## 2013-10-12 NOTE — Progress Notes (Signed)
Patient is on the transplant list at [facility] and [facility] for a liver. Her physician is Dr. Monica Martinez at [facility]. Patient states she has called [facility].  If physician wants to talk physician to physician they can call [phone].

## 2013-10-12 NOTE — Progress Notes (Addendum)
Note: This document was prepared with digital dictation and possible smart phrase technology. Any transcriptional errors that result from this process are unintentional.   [name] [id number] DOB: [birth year] DOA: 10/11/2013 PCP: Glo Herring., MD  Brief narrative: 53 y/o ?, h/o chronic hep C s/p Rx + cirrhosis, prior h/o Hep encephalopathy-On transplant list at [place] curl coordinator]/[facility], Portal vein thrombosis, Esoph Varices, Colon 10/2011 2 adenoma +  Diverticula/hemorrhoids, h/o Peritonitis in 05/2013 despite being on Cipro re-presented to [facility] 10/11/13 while on Bactrim with Abd pain, subj fever and chills  Past medical history-As per Problem list Chart reviewed as below- Reviewed from care everywhere  CIRRHOSIS CARE: 1. EGD: 09/19/13. No varices; on NSBB. 2. Imaging: MRI 02/27/2013. No HCC. 3. Vaccination: Immune to A, received 2 of 3 vaccinations for B. 4. Bone density: 02/26/2013. Osteopenia. 5. Transplant: Approved but not listed secondary to low MELD score (11).  Consultants:   GI  Procedures:  none  Antibiotics:  IV levaquin   Subjective  Doing fair on her. States pain is 9/10 Was able to tolerate some toast this morning Does not feel distended Sitting up in bed    Objective    Interim History:   Telemetry: Sinus   Objective: Filed Vitals:   10/11/13 2215 10/11/13 2229 10/11/13 2300 10/12/13 0500  BP:   112/59 106/52  Pulse:   90 83  Temp:  98.8 F (37.1 C) 98.7 F (37.1 C) 98.6 F (37 C)  TempSrc:  Oral Oral Oral  Resp:    18  Height:   5\' 6"  (1.676 m)   Weight:   71.079 kg (156 lb 11.2 oz)   SpO2: 94%  93% 95%    Intake/Output Summary (Last 24 hours) at 10/12/13 1122 Last data filed at 10/12/13 0915  Gross per 24 hour  Intake    240 ml  Output      0 ml  Net    240 ml    Exam:  General: Alert pleasant in some painful distress Cardiovascular: S1-S2 no murmur rub or gallop Respiratory: Clinically clear Abdomen: Distended  slightly tender in lower quadrants Skin no lower extremity edema Neuro grossly intact no asterixis power 5/5 point reflexes 2/  Data Reviewed: Basic Metabolic Panel:  Recent Labs Lab 10/11/13 1525 10/12/13 0613  NA 136* 133*  K 3.8 3.8  CL 98 99  CO2 24 24  GLUCOSE 230* 243*  BUN 12 13  CREATININE 0.86 0.81  CALCIUM 9.0 7.9*   Liver Function Tests:  Recent Labs Lab 10/11/13 1620  AST 31  ALT 34  ALKPHOS 84  BILITOT 1.3*  PROT 6.5  ALBUMIN 3.2*    Recent Labs Lab 10/11/13 1620  LIPASE 33    Recent Labs Lab 10/11/13 1620  AMMONIA 55   CBC:  Recent Labs Lab 10/11/13 1525 10/12/13 0613  WBC 7.0 5.2  NEUTROABS 6.4 4.4  HGB 9.9* 8.3*  HCT 30.8* 26.0*  MCV 82.4 82.8  PLT 48* 36*   Cardiac Enzymes: No results found for this basename: CKTOTAL, CKMB, CKMBINDEX, TROPONINI,  in the last 168 hours BNP: No components found with this basename: POCBNP,  CBG: No results found for this basename: GLUCAP,  in the last 168 hours  Recent Results (from the past 240 hour(s))  CULTURE, BLOOD (ROUTINE X 2)     Status: None   Collection Time    10/11/13  4:20 PM      Result Value Ref Range Status   Specimen  Description BLOOD RIGHT WRIST   Final   Special Requests BOTTLES DRAWN AEROBIC AND ANAEROBIC 12CC   Final   Culture     Final   Value: GRAM POSITIVE COCCI IN CHAINS ANAEROBIC BOTTLE     Gram Stain Report Called to,Read Back By and Verified With: WRIGHT M. AT 0830A ON [id number] BY THOMPSON S.   Report Status PENDING   Incomplete  CULTURE, BLOOD (ROUTINE X 2)     Status: None   Collection Time    10/11/13  5:22 PM      Result Value Ref Range Status   Specimen Description BLOOD RIGHT WRIST   Final   Special Requests BOTTLES DRAWN AEROBIC AND ANAEROBIC 10CC   Final   Culture NO GROWTH 1 DAY   Final   Report Status PENDING   Incomplete     Studies:              All Imaging reviewed and is as per above notation   Scheduled Meds: . baclofen  10 mg Oral BID  .  ferrous sulfate  325 mg Oral BID WC  . furosemide  80 mg Oral Daily  . lactulose  30 g Oral TID  . levofloxacin (LEVAQUIN) IV  750 mg Intravenous Q24H  . multivitamin with minerals  1 tablet Oral Daily  . nadolol  10 mg Oral q morning - 10a  . pantoprazole  40 mg Oral Daily  . rifaximin  550 mg Oral BID  . spironolactone  300 mg Oral Daily  . sulfamethoxazole-trimethoprim  1 tablet Oral Daily  . traZODone  50 mg Oral QHS   Continuous Infusions:    Assessment/Plan: 1. Spontaneous bacterial peritonitis-peritoneal fluid shows 8 thousand WBCs-continue levofloxacin IV for now with Bactrim 1 tab daily suppressive therapy as well as rifaximin 550 twice a day.  1/2 blood culture gram-positive cocci anaerobic culture await speciation, await peritoneal fluid culture. For abdominal pain we will increase her morphine 2-3 mg every 2 when necessary  2. 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 Blood pressure is a little low so we'll monitor her on Nadolol 10 mg daily and continue her Aldactone 300.  Lasix 80 will be increased to 120 [should be 160 to preserve ratio]  may be limited by hypotension and we will have to adjust this to daily and readjust daily.  She will continue her lactulose 30 3 times a day_i have tried to call and message Dr. Monica Martinez at [facility] and await a response  Cell [phone].  He recommends Albumin 75 bid today and then another 50 mg on day 3.  He recommends to hold the Nadolol as well for now and start some midodrine.  We will implement these changes. 3. Pruritus-continue faxing 25 every 6 when necessary 4. Chronic pain, continue back from 10 twice a day, Flexeril 10 3 times a day 5. Reflux, prior variceal bleed-continue Protonix 40 daily  Code Status: Full  Family Communication: None bedside  Disposition Plan: Inpatient   Verneita Griffes, MD  [facility] Pager [phone] 10/12/2013, 11:22 AM    LOS: 1  day

## 2013-10-12 NOTE — Plan of Care (Signed)
Lab called with Positive blood culture from 09/4 draw.  Gram Positive Cocci in chains. Dr. Informed.

## 2013-10-13 LAB — CULTURE, BLOOD (ROUTINE X 2)

## 2013-10-13 LAB — COMPREHENSIVE METABOLIC PANEL
ALT: 48 U/L — AB (ref 0–35)
AST: 29 U/L (ref 0–37)
Albumin: 3.3 g/dL — ABNORMAL LOW (ref 3.5–5.2)
Alkaline Phosphatase: 83 U/L (ref 39–117)
Anion gap: 12 (ref 5–15)
BUN: 14 mg/dL (ref 6–23)
CALCIUM: 8.2 mg/dL — AB (ref 8.4–10.5)
CO2: 24 mEq/L (ref 19–32)
Chloride: 98 mEq/L (ref 96–112)
Creatinine, Ser: 0.82 mg/dL (ref 0.50–1.10)
GFR calc Af Amer: >90 mL/min (ref >90)
GFR calc non Af Amer: 81 mL/min — ABNORMAL LOW (ref >90)
Glucose, Bld: 226 mg/dL — ABNORMAL HIGH (ref 70–99)
POTASSIUM: 3.8 meq/L (ref 3.7–5.3)
SODIUM: 134 meq/L — AB (ref 137–147)
TOTAL PROTEIN: 6.3 g/dL (ref 6.0–8.3)
Total Bilirubin: 1.1 mg/dL (ref 0.3–1.2)

## 2013-10-13 LAB — PROTIME-INR
INR: 1.98 — ABNORMAL HIGH (ref 0.00–1.49)
Prothrombin Time: 22.5 seconds — ABNORMAL HIGH (ref 11.6–15.2)

## 2013-10-13 MED ORDER — PIPERACILLIN-TAZOBACTAM 3.375 G IVPB
3.3750 g | Freq: Three times a day (TID) | INTRAVENOUS | Status: DC
  Administered 2013-10-13 – 2013-10-17 (×13): 3.375 g via INTRAVENOUS
  Filled 2013-10-13 (×16): qty 50

## 2013-10-13 NOTE — Progress Notes (Signed)
ANTIBIOTIC CONSULT NOTE - INITIAL  Pharmacy Consult for Zosyn Indication: aspiration pneumonia  Allergies  Allergen Reactions  . Ancef [Cefazolin Sodium] Other (See Comments)    "Blisters in mouth and vagina"  . Cefazolin Rash    Blisters in mouth and vagina Blisters in mouth and vagina    Patient Measurements: Height: 5\' 6"  (167.6 cm) Weight: 157 lb (71.215 kg) IBW/kg (Calculated) : 59.3 Adjusted Body Weight:   Vital Signs: Temp: 98.1 F (36.7 C) (09/06 [id number]) Temp src: Oral (09/06 0613) BP: 108/52 mmHg (09/06 0613) Pulse Rate: 64 (09/06 0613) Intake/Output from previous day: 09/05 0701 - 09/06 0700 In: 720 [P.O.:720] Out: 1800 [Urine:1800] Intake/Output from this shift: Total I/O In: 120 [P.O.:120] Out: -   Labs:  Recent Labs  10/11/13 1525 10/12/13 0613 10/12/13 1511 10/13/13 0607  WBC 7.0 5.2 5.2  --   HGB 9.9* 8.3* 8.9*  --   PLT 48* 36* 40*  --   CREATININE 0.86 0.81  --  0.82   Estimated Creatinine Clearance: 81.2 ml/min (by C-G formula based on Cr of 0.82). No results found for this basename: VANCOTROUGH, VANCOPEAK, VANCORANDOM, GENTTROUGH, GENTPEAK, GENTRANDOM, TOBRATROUGH, TOBRAPEAK, TOBRARND, AMIKACINPEAK, AMIKACINTROU, AMIKACIN,  in the last 72 hours   Microbiology: Recent Results (from the past 720 hour(s))  CULTURE, BLOOD (ROUTINE X 2)     Status: None   Collection Time    10/11/13  4:20 PM      Result Value Ref Range Status   Specimen Description BLOOD RIGHT WRIST   Final   Special Requests BOTTLES DRAWN AEROBIC AND ANAEROBIC 12CC   Final   Culture  Setup Time     Final   Value: 10/12/2013 19:53     Performed at [facility]   Culture     Final   Value: VIRIDANS STREPTOCOCCUS     Note: THE SIGNIFICANCE OF ISOLATING THIS ORGANISM FROM A SINGLE VENIPUNCTURE CANNOT BE PREDICTED WITHOUT FURTHER CLINICAL AND CULTURE CORRELATION. SUSCEPTIBILITIES AVAILABLE ONLY ON REQUEST.     Note: Gram Stain Report Called to,Read Back By and Verified  With: WRIGHT M. AT 0830A ON [id number] BY THOMPSON S. Performed at [facility]     Performed at [facility]   Report Status 10/13/2013 FINAL   Final  BODY FLUID CULTURE     Status: None   Collection Time    10/11/13  4:50 PM      Result Value Ref Range Status   Specimen Description PERITONEAL FLUID   Final   Special Requests NONE   Final   Gram Stain     Final   Value: FEW WBC PRESENT,BOTH PMN AND MONONUCLEAR     NO ORGANISMS SEEN     Performed at [facility]   Culture     Final   Value: NO GROWTH     Performed at [facility]   Report Status PENDING   Incomplete  CULTURE, BLOOD (ROUTINE X 2)     Status: None   Collection Time    10/11/13  5:22 PM      Result Value Ref Range Status   Specimen Description BLOOD RIGHT WRIST   Final   Special Requests BOTTLES DRAWN AEROBIC AND ANAEROBIC 10CC   Final   Culture NO GROWTH 2 DAYS   Final   Report Status PENDING   Incomplete    Medical History: Past Medical History  Diagnosis Date  . Esophageal varices   . Cirrhosis  non alcoholic  . Ascites   . Anemia   . Liver failure     "I'm on liver transplant list @ [facility]" (11/07/2012)  . Portal vein thrombosis   . Complication of anesthesia   . PONV (postoperative nausea and vomiting)   . Heart murmur   . Type II diabetes mellitus     "not since gastric bypass" (11/07/2012)  . Hepatitis C     Hx: of Hep "C" it was eradicated  . [id number])     "monthly" (11/07/2012)  . Anxiety   . Depression   . Kidney stones     Medications:  Scheduled:  . albumin human  75 g Intravenous q12n4p  . baclofen  10 mg Oral BID  . ferrous sulfate  325 mg Oral BID WC  . furosemide  80 mg Oral Daily  . lactulose  30 g Oral TID  . midodrine  5 mg Oral TID WC  . multivitamin with minerals  1 tablet Oral Daily  . pantoprazole  40 mg Oral Daily  . piperacillin-tazobactam (ZOSYN)  IV  3.375 g Intravenous Q8H  . rifaximin  550 mg Oral BID  . spironolactone  300 mg  Oral Daily  . sulfamethoxazole-trimethoprim  1 tablet Oral Daily  . traZODone  50 mg Oral QHS   Assessment: Zosyn per pharmacy protocol for aspiration pneumonia Cefazolin allergy noted. Per MD patient has received PCN in past with no reactions CrCl > 20 ml/min  Goal of Therapy:  Eradicate infection  Plan:  Zosyn 3.375 GM IV every 8 hours, infuse each dose over 4 hours Monitor renal function Labs per protocol  [name], [name] 10/13/2013,11:28 AM

## 2013-10-13 NOTE — Progress Notes (Signed)
Note: This document was prepared with digital dictation and possible smart phrase technology. Any transcriptional errors that result from this process are unintentional.   [name] [id number] DOB: 11-26-1960 DOA: 10/11/2013 PCP: Glo Herring., MD  Brief narrative: 53 y/o ?, h/o chronic hep C s/p Rx + cirrhosis, prior h/o Hep encephalopathy-On transplant list at [place] curl coordinator]/[facility], Portal vein thrombosis, Esoph Varices, Colon 10/2011 2 adenoma +  Diverticula/hemorrhoids, h/o Peritonitis in 05/2013 despite being on Cipro re-presented to [facility] 10/11/13 while on Bactrim with Abd pain, subj fever and chills  Past medical history-As per Problem list Chart reviewed as below- Reviewed from care everywhere  CIRRHOSIS CARE: 1. EGD: 09/19/13. No varices; on NSBB. 2. Imaging: MRI 02/27/2013. No HCC. 3. Vaccination: Immune to A, received 2 of 3 vaccinations for B. 4. Bone density: 02/26/2013. Osteopenia. 5. Transplant: Approved but not listed secondary to low MELD score (11).  Consultants:   GI  Procedures:  none  Antibiotics:  IV levaquin   Subjective   Doing fair Pain this am on turning side to side in bed N + 1 episode vomit-non bloody, no dysphagia/gerd like symptoms    Objective    Interim History:   Telemetry: Sinus   Objective: Filed Vitals:   10/12/13 0500 10/12/13 1452 10/12/13 2109 10/13/13 0613  BP: 106/52 96/48 94/48  108/52  Pulse: 83 66 69 64  Temp: 98.6 F (37 C) 98.2 F (36.8 C) 98.2 F (36.8 C) 98.1 F (36.7 C)  TempSrc: Oral Oral Oral Oral  Resp: 18 18 20 17   Height:      Weight:    71.215 kg (157 lb)  SpO2: 95% 95% 92% 93%    Intake/Output Summary (Last 24 hours) at 10/13/13 1210 Last data filed at 10/13/13 0900  Gross per 24 hour  Intake    360 ml  Output   1800 ml  Net  -1440 ml    Exam:  General: Alert pleasant in some painful distress Cardiovascular: S1-S2 no murmur rub or gallop Respiratory: Clinically  clear Abdomen: Distended slightly tender in lower quadrants Skin no lower extremity edema Neuro grossly intact no asterixis power 5/5 point reflexes 2/  Data Reviewed: Basic Metabolic Panel:  Recent Labs Lab 10/11/13 1525 10/12/13 0613 10/13/13 0607  NA 136* 133* 134*  K 3.8 3.8 3.8  CL 98 99 98  CO2 24 24 24   GLUCOSE 230* 243* 226*  BUN 12 13 14   CREATININE 0.86 0.81 0.82  CALCIUM 9.0 7.9* 8.2*   Liver Function Tests:  Recent Labs Lab 10/11/13 1620 10/13/13 0607  AST 31 29  ALT 34 48*  ALKPHOS 84 83  BILITOT 1.3* 1.1  PROT 6.5 6.3  ALBUMIN 3.2* 3.3*    Recent Labs Lab 10/11/13 1620  LIPASE 33    Recent Labs Lab 10/11/13 1620  AMMONIA 55   CBC:  Recent Labs Lab 10/11/13 1525 10/12/13 0613 10/12/13 1511  WBC 7.0 5.2 5.2  NEUTROABS 6.4 4.4 4.3  HGB 9.9* 8.3* 8.9*  HCT 30.8* 26.0* 27.9*  MCV 82.4 82.8 83.3  PLT 48* 36* 40*   Cardiac Enzymes: No results found for this basename: CKTOTAL, CKMB, CKMBINDEX, TROPONINI,  in the last 168 hours BNP: No components found with this basename: POCBNP,  CBG: No results found for this basename: GLUCAP,  in the last 168 hours  Recent Results (from the past 240 hour(s))  CULTURE, BLOOD (ROUTINE X 2)     Status: None   Collection Time  10/11/13  4:20 PM      Result Value Ref Range Status   Specimen Description BLOOD RIGHT WRIST   Final   Special Requests BOTTLES DRAWN AEROBIC AND ANAEROBIC 12CC   Final   Culture  Setup Time     Final   Value: 10/12/2013 19:53     Performed at [facility]   Culture     Final   Value: VIRIDANS STREPTOCOCCUS     Note: THE SIGNIFICANCE OF ISOLATING THIS ORGANISM FROM A SINGLE VENIPUNCTURE CANNOT BE PREDICTED WITHOUT FURTHER CLINICAL AND CULTURE CORRELATION. SUSCEPTIBILITIES AVAILABLE ONLY ON REQUEST.     Note: Gram Stain Report Called to,Read Back By and Verified With: WRIGHT M. AT 0830A ON [id number] BY THOMPSON S. Performed at [facility]     Performed at  [facility]   Report Status 10/13/2013 FINAL   Final  BODY FLUID CULTURE     Status: None   Collection Time    10/11/13  4:50 PM      Result Value Ref Range Status   Specimen Description PERITONEAL FLUID   Final   Special Requests NONE   Final   Gram Stain     Final   Value: FEW WBC PRESENT,BOTH PMN AND MONONUCLEAR     NO ORGANISMS SEEN     Performed at [facility]   Culture     Final   Value: NO GROWTH     Performed at [facility]   Report Status PENDING   Incomplete  CULTURE, BLOOD (ROUTINE X 2)     Status: None   Collection Time    10/11/13  5:22 PM      Result Value Ref Range Status   Specimen Description BLOOD RIGHT WRIST   Final   Special Requests BOTTLES DRAWN AEROBIC AND ANAEROBIC 10CC   Final   Culture NO GROWTH 2 DAYS   Final   Report Status PENDING   Incomplete     Studies:              All Imaging reviewed and is as per above notation   Scheduled Meds: . albumin human  75 g Intravenous q12n4p  . baclofen  10 mg Oral BID  . ferrous sulfate  325 mg Oral BID WC  . furosemide  80 mg Oral Daily  . lactulose  30 g Oral TID  . midodrine  5 mg Oral TID WC  . multivitamin with minerals  1 tablet Oral Daily  . pantoprazole  40 mg Oral Daily  . piperacillin-tazobactam (ZOSYN)  IV  3.375 g Intravenous Q8H  . rifaximin  550 mg Oral BID  . spironolactone  300 mg Oral Daily  . sulfamethoxazole-trimethoprim  1 tablet Oral Daily  . traZODone  50 mg Oral QHS   Continuous Infusions:    Assessment/Plan: 1. Spontaneous bacterial peritonitis-peritoneal fluid shows 8 thousand WBCs- levofloxacin IV-->Zosyn.  Cont. Bactrim 1 tab daily suppressive therapy as well as rifaximin 550 twice a day.  Viridans strep on anaerobic culture ins contaminant. Await peritoneal fluid culture. For abdominal pain we will increase her morphine 2-3 mg every 2 when necessary  2. 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 Blood pressure low  nl-continue Aldactone 300.  Lasix 80 .  She will continue her lactulose 30 3 times a day.  d/w Dr. Monica Martinez at [facility] [place] [phone].  recommends Albumin 75 bid 9/5\ and then another 50 mg on day  9/8.  He recommends to hold the Nadolol as well for now and start some midodrine.  We will implement these changes.  If no better in am will repeat LVP as her baseline weight is 143 and she is currently at 157 3. Pruritus-continue atarax 25 every 6 when necessary 4. Chronic abd pain, continue baclofen 10 twice a day, Flexeril 10 3 times a day.   5. Reflux, prior variceal bleed-continue Protonix 40 daily  Code Status: Full  Family Communication: None bedside  Disposition Plan: Inpatient   Verneita Griffes, MD  [facility] Pager [phone] 10/13/2013, 12:10 PM    LOS: 2 days

## 2013-10-14 ENCOUNTER — Inpatient Hospital Stay (HOSPITAL_COMMUNITY): Payer: BC Managed Care – PPO

## 2013-10-14 LAB — COMPREHENSIVE METABOLIC PANEL WITH GFR
ALT: 35 U/L (ref 0–35)
AST: 20 U/L (ref 0–37)
Albumin: 3.8 g/dL (ref 3.5–5.2)
Alkaline Phosphatase: 84 U/L (ref 39–117)
Anion gap: 12 (ref 5–15)
BUN: 13 mg/dL (ref 6–23)
CO2: 27 meq/L (ref 19–32)
Calcium: 8.5 mg/dL (ref 8.4–10.5)
Chloride: 100 meq/L (ref 96–112)
Creatinine, Ser: 0.94 mg/dL (ref 0.50–1.10)
GFR calc Af Amer: 79 mL/min — ABNORMAL LOW
GFR calc non Af Amer: 69 mL/min — ABNORMAL LOW
Glucose, Bld: 195 mg/dL — ABNORMAL HIGH (ref 70–99)
Potassium: 3.5 meq/L — ABNORMAL LOW (ref 3.7–5.3)
Sodium: 139 meq/L (ref 137–147)
Total Bilirubin: 1.1 mg/dL (ref 0.3–1.2)
Total Protein: 6.4 g/dL (ref 6.0–8.3)

## 2013-10-14 LAB — CBC WITH DIFFERENTIAL/PLATELET
Basophils Absolute: 0 K/uL (ref 0.0–0.1)
Basophils Relative: 0 % (ref 0–1)
Eosinophils Absolute: 0 K/uL (ref 0.0–0.7)
Eosinophils Relative: 1 % (ref 0–5)
HCT: 25.4 % — ABNORMAL LOW (ref 36.0–46.0)
Hemoglobin: 8.2 g/dL — ABNORMAL LOW (ref 12.0–15.0)
Lymphocytes Relative: 10 % — ABNORMAL LOW (ref 12–46)
Lymphs Abs: 0.3 K/uL — ABNORMAL LOW (ref 0.7–4.0)
MCH: 26.3 pg (ref 26.0–34.0)
MCHC: 32.3 g/dL (ref 30.0–36.0)
MCV: 81.4 fL (ref 78.0–100.0)
Monocytes Absolute: 0.3 K/uL (ref 0.1–1.0)
Monocytes Relative: 8 % (ref 3–12)
Neutro Abs: 2.6 K/uL (ref 1.7–7.7)
Neutrophils Relative %: 80 % — ABNORMAL HIGH (ref 43–77)
Platelets: 37 K/uL — ABNORMAL LOW (ref 150–400)
RBC: 3.12 MIL/uL — ABNORMAL LOW (ref 3.87–5.11)
RDW: 16.3 % — ABNORMAL HIGH (ref 11.5–15.5)
WBC: 3.2 K/uL — ABNORMAL LOW (ref 4.0–10.5)

## 2013-10-14 LAB — GLUCOSE, CAPILLARY
Glucose-Capillary: 221 mg/dL — ABNORMAL HIGH (ref 70–99)
Glucose-Capillary: 195 mg/dL — ABNORMAL HIGH (ref 70–99)

## 2013-10-14 LAB — PROTIME-INR
INR: 1.58 — ABNORMAL HIGH (ref 0.00–1.49)
Prothrombin Time: 18.9 s — ABNORMAL HIGH (ref 11.6–15.2)

## 2013-10-14 MED ORDER — INSULIN ASPART 100 UNIT/ML ~~LOC~~ SOLN
0.0000 [IU] | Freq: Three times a day (TID) | SUBCUTANEOUS | Status: DC
  Administered 2013-10-14: 3 [IU] via SUBCUTANEOUS
  Administered 2013-10-15: 7 [IU] via SUBCUTANEOUS
  Administered 2013-10-15: 5 [IU] via SUBCUTANEOUS
  Administered 2013-10-15: 3 [IU] via SUBCUTANEOUS
  Administered 2013-10-16: 5 [IU] via SUBCUTANEOUS
  Administered 2013-10-16: 3 [IU] via SUBCUTANEOUS
  Administered 2013-10-16: 9 [IU] via SUBCUTANEOUS

## 2013-10-14 MED ORDER — IOHEXOL 300 MG/ML  SOLN
50.0000 mL | Freq: Once | INTRAMUSCULAR | Status: AC | PRN
  Administered 2013-10-14: 50 mL via ORAL

## 2013-10-14 MED ORDER — INSULIN ASPART 100 UNIT/ML ~~LOC~~ SOLN
3.0000 [IU] | Freq: Three times a day (TID) | SUBCUTANEOUS | Status: DC
  Administered 2013-10-15 – 2013-10-17 (×9): 3 [IU] via SUBCUTANEOUS

## 2013-10-14 MED ORDER — LACTULOSE 10 GM/15ML PO SOLN
30.0000 g | Freq: Every day | ORAL | Status: DC | PRN
Start: 2013-10-14 — End: 2013-10-17

## 2013-10-14 MED ORDER — SODIUM CHLORIDE 0.9 % IJ SOLN
INTRAMUSCULAR | Status: AC
  Administered 2013-10-14: 3 mL
  Filled 2013-10-14: qty 500

## 2013-10-14 NOTE — Care Management Note (Signed)
    Page 1 of 1   10/18/2013     8:50:37 AM CARE MANAGEMENT NOTE 10/18/2013  Patient:  [name], [name]   Account Number:  [id number]  Date Initiated:  10/14/2013  Documentation initiated by:  Vladimir Creeks  Subjective/Objective Assessment:   patient admitted with SBP, and history of liver disease cirrhosis and hepatitis C she is on the transplant list at [facility] patient about the antibiotics. Patient is from home, is independent and bill will be returning home at discharge.     Action/Plan:   patient lives with family. No needs identified at this time but will continue to follow   Anticipated DC Date:  10/17/2013   Anticipated DC Plan:  [place]  CM consult      [place] Choice  HOME HEALTH   Choice offered to / List presented to:  C-1 Patient        [place] arranged  HH-1 RN      [place].   Status of service:  Completed, signed off Medicare Important Message given?   (If response is "NO", the following Medicare IM given date fields will be blank) Date Medicare IM given:   Medicare IM given by:   Date Additional Medicare IM given:   Additional Medicare IM given by:    Discharge Disposition:  [place]  Per UR Regulation:  Reviewed for med. necessity/level of care/duration of stay  If discussed at [place] of Stay Meetings, dates discussed:    Comments:  10/17/13 [place] RN/CM Pt returning home with new Dx DM and new insulin. She is using an insulin pen. [place] RN will be of assistance with this. She states she uses [facility], and this is set up for first visit tomorrow at 1000 when next insulin dose is due. 10/14/13 1600 , Vladimir Creeks RN/CM

## 2013-10-14 NOTE — Progress Notes (Signed)
Note: This document was prepared with digital dictation and possible smart phrase technology. Any transcriptional errors that result from this process are unintentional.   [name] [id number] DOB: [birth year] DOA: 10/11/2013 PCP: Glo Herring., MD  Brief narrative: 53 y/o ?, h/o chronic hep C s/p Rx + cirrhosis, prior h/o Hep encephalopathy-On transplant list at [place] [facility], Portal vein thrombosis, Esoph Varices, Colon 10/2011 2 adenoma +  Diverticula/hemorrhoids, h/o Peritonitis in 05/2013 despite being on Cipro re-presented to [facility] 10/11/13 while on Bactrim with Abd pain, subj fever and chills.   Past medical history-As per Problem list Chart reviewed as below- Reviewed from care everywhere  CIRRHOSIS CARE: 1. EGD: 09/19/13. No varices; on NSBB. 2. Imaging: MRI 02/27/2013. No HCC. 3. Vaccination: Immune to A, received 2 of 3 vaccinations for B. 4. Bone density: 02/26/2013. Osteopenia. 5. Transplant: Approved but not listed secondary to low MELD score (11).  Consultants:   GI  Procedures:  none  Antibiotics:  IV levaquin   Subjective   Doing fair Still has 8/10 abdominal pain which is only partially relieved by medications Only takes lactulose every 2 days to 3 days daily Tolerating diet poorly on No fever no chills No nausea no vomiting    Objective    Interim History:   Telemetry: Sinus   Objective: Filed Vitals:   10/13/13 1500 10/13/13 1851 10/13/13 2235 10/14/13 0600  BP: 114/60  103/42 102/45  Pulse: 57  69 64  Temp:   98.5 F (36.9 C) 98.4 F (36.9 C)  TempSrc:   Oral Oral  Resp: 20  20 17   Height:  5\' 6"  (1.676 m)    Weight:  65.772 kg (145 lb)  70.398 kg (155 lb 3.2 oz)  SpO2: 91%  94% 93%    Intake/Output Summary (Last 24 hours) at 10/14/13 1345 Last data filed at 10/14/13 1300  Gross per 24 hour  Intake   1110 ml  Output   2450 ml  Net  -1340 ml    Exam:  General: Alert pleasant in some painful  distress Cardiovascular: S1-S2 no murmur rub or gallop Respiratory: Clinically clear Abdomen: Distended+ tender in lower quadrants Skin no lower extremity edema Neuro grossly intact no asterixis power 5/5 point reflexes 2/3  Data Reviewed: Basic Metabolic Panel:  Recent Labs Lab 10/11/13 1525 10/12/13 0613 10/13/13 0607 10/14/13 0551  NA 136* 133* 134* 139  K 3.8 3.8 3.8 3.5*  CL 98 99 98 100  CO2 24 24 24 27   GLUCOSE 230* 243* 226* 195*  BUN 12 13 14 13   CREATININE 0.86 0.81 0.82 0.94  CALCIUM 9.0 7.9* 8.2* 8.5   Liver Function Tests:  Recent Labs Lab 10/11/13 1620 10/13/13 0607 10/14/13 0551  AST 31 29 20   ALT 34 48* 35  ALKPHOS 84 83 84  BILITOT 1.3* 1.1 1.1  PROT 6.5 6.3 6.4  ALBUMIN 3.2* 3.3* 3.8    Recent Labs Lab 10/11/13 1620  LIPASE 33    Recent Labs Lab 10/11/13 1620  AMMONIA 55   CBC:  Recent Labs Lab 10/11/13 1525 10/12/13 0613 10/12/13 1511 10/14/13 0551  WBC 7.0 5.2 5.2 3.2*  NEUTROABS 6.4 4.4 4.3 2.6  HGB 9.9* 8.3* 8.9* 8.2*  HCT 30.8* 26.0* 27.9* 25.4*  MCV 82.4 82.8 83.3 81.4  PLT 48* 36* 40* 37*   Cardiac Enzymes: No results found for this basename: CKTOTAL, CKMB, CKMBINDEX, TROPONINI,  in the last 168 hours BNP: No components found with this basename: POCBNP,  CBG: No results found for this basename: GLUCAP,  in the last 168 hours  Recent Results (from the past 240 hour(s))  CULTURE, BLOOD (ROUTINE X 2)     Status: None   Collection Time    10/11/13  4:20 PM      Result Value Ref Range Status   Specimen Description BLOOD RIGHT WRIST   Final   Special Requests BOTTLES DRAWN AEROBIC AND ANAEROBIC 12CC   Final   Culture  Setup Time     Final   Value: 10/12/2013 19:53     Performed at [facility]   Culture     Final   Value: VIRIDANS STREPTOCOCCUS     Note: THE SIGNIFICANCE OF ISOLATING THIS ORGANISM FROM A SINGLE VENIPUNCTURE CANNOT BE PREDICTED WITHOUT FURTHER CLINICAL AND CULTURE CORRELATION.  SUSCEPTIBILITIES AVAILABLE ONLY ON REQUEST.     Note: Gram Stain Report Called to,Read Back By and Verified With: WRIGHT M. AT 0830A ON UG:[id number] BY THOMPSON S. Performed at [facility]     Performed at [facility]   Report Status 10/13/2013 FINAL   Final  BODY FLUID CULTURE     Status: None   Collection Time    10/11/13  4:50 PM      Result Value Ref Range Status   Specimen Description PERITONEAL FLUID   Final   Special Requests NONE   Final   Gram Stain     Final   Value: FEW WBC PRESENT,BOTH PMN AND MONONUCLEAR     NO ORGANISMS SEEN     Performed at [facility]   Culture     Final   Value: NO GROWTH 3 DAYS     Performed at [facility]   Report Status PENDING   Incomplete  CULTURE, BLOOD (ROUTINE X 2)     Status: None   Collection Time    10/11/13  5:22 PM      Result Value Ref Range Status   Specimen Description BLOOD RIGHT WRIST   Final   Special Requests BOTTLES DRAWN AEROBIC AND ANAEROBIC 10CC   Final   Culture NO GROWTH 2 DAYS   Final   Report Status PENDING   Incomplete     Studies:              All Imaging reviewed and is as per above notation   Scheduled Meds: . baclofen  10 mg Oral BID  . ferrous sulfate  325 mg Oral BID WC  . furosemide  80 mg Oral Daily  . lactulose  30 g Oral TID  . midodrine  5 mg Oral TID WC  . multivitamin with minerals  1 tablet Oral Daily  . pantoprazole  40 mg Oral Daily  . piperacillin-tazobactam (ZOSYN)  IV  3.375 g Intravenous Q8H  . rifaximin  550 mg Oral BID  . spironolactone  300 mg Oral Daily  . sulfamethoxazole-trimethoprim  1 tablet Oral Daily  . traZODone  50 mg Oral QHS   Continuous Infusions:    Assessment/Plan: 1. Spontaneous bacterial peritonitis-peritoneal fluid shows 8 thousand WBCs- levofloxacin IV-->Zosyn.  Cont. Bactrim 1 tab daily suppressive therapy as well as rifaximin 550 twice a day.  Viridans strep on anaerobic culture ins contaminant. Await peritoneal fluid culture. For  abdominal pain we will increase her morphine 2-3 mg every 2 when necessary-given nonresolution of her abdominal pain, I will get a CT scan with oral contrast only to delineate etiology. She may benefit from a  repeat paracentesis which I will order for tomorrow am and get labs 2. 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 Blood pressure low nl-continue Aldactone 300.  Lasix 80 .  She will continue her lactulose 30 3 times a day.  d/w Dr. Monica Martinez at [facility] [place] [phone].  recommends Albumin 75 bid 9/5\ and then another 50 mg on day 9/8.  He recommends to hold the Nadolol as well for now and start some midodrine.  We will implement these changes.  If no better in am will repeat LVP as her baseline weight is 143 and she is currently at 157 3. Diet-controlled diabetes mellitus-patient noncompliant on diabetic regimen and wishes to drink regular soda. Sugars stated above 200 therefore we will start ac hs coverage 4. Pruritus-continue atarax 25 every 6 when necessary 5. Chronic abd pain, continue baclofen 10 twice a day, Flexeril 10 3 times a day.   6. Reflux, prior variceal bleed-continue Protonix 40 daily  Code Status: Full  Family Communication: None bedside  Disposition Plan: Inpatient   Verneita Griffes, MD  [facility] Pager [phone] 10/14/2013, 1:45 PM    LOS: 3 days

## 2013-10-14 NOTE — Progress Notes (Signed)
Patient requested regular soda this morning. Blood sugars have been elevated during admission (200s), and was 195mg /dL today. Patient educated on importance of controlling blood sugar, and that diet soda would be best. Patient still insists on regular soda. Dr. Verlon Au made aware of elevated blood glucose levels. Will continue to monitor patient.

## 2013-10-15 ENCOUNTER — Other Ambulatory Visit (HOSPITAL_COMMUNITY): Payer: BC Managed Care – PPO

## 2013-10-15 ENCOUNTER — Inpatient Hospital Stay (HOSPITAL_COMMUNITY): Payer: BC Managed Care – PPO

## 2013-10-15 DIAGNOSIS — K7460 Unspecified cirrhosis of liver: Secondary | ICD-10-CM

## 2013-10-15 LAB — COMPREHENSIVE METABOLIC PANEL
ALT: 38 U/L — ABNORMAL HIGH (ref 0–35)
AST: 27 U/L (ref 0–37)
Albumin: 3.8 g/dL (ref 3.5–5.2)
Alkaline Phosphatase: 118 U/L — ABNORMAL HIGH (ref 39–117)
Anion gap: 12 (ref 5–15)
BILIRUBIN TOTAL: 1.1 mg/dL (ref 0.3–1.2)
BUN: 14 mg/dL (ref 6–23)
CHLORIDE: 96 meq/L (ref 96–112)
CO2: 29 meq/L (ref 19–32)
Calcium: 8.9 mg/dL (ref 8.4–10.5)
Creatinine, Ser: 1.03 mg/dL (ref 0.50–1.10)
GFR calc Af Amer: 71 mL/min — ABNORMAL LOW (ref >90)
GFR, EST NON AFRICAN AMERICAN: 61 mL/min — AB (ref >90)
Glucose, Bld: 267 mg/dL — ABNORMAL HIGH (ref 70–99)
Potassium: 3.3 mEq/L — ABNORMAL LOW (ref 3.7–5.3)
SODIUM: 137 meq/L (ref 137–147)
Total Protein: 6.7 g/dL (ref 6.0–8.3)

## 2013-10-15 LAB — CBC WITH DIFFERENTIAL/PLATELET
BASOS PCT: 0 % (ref 0–1)
Basophils Absolute: 0 10*3/uL (ref 0.0–0.1)
EOS ABS: 0.1 10*3/uL (ref 0.0–0.7)
Eosinophils Relative: 2 % (ref 0–5)
HEMATOCRIT: 25.9 % — AB (ref 36.0–46.0)
HEMOGLOBIN: 8.6 g/dL — AB (ref 12.0–15.0)
Lymphocytes Relative: 18 % (ref 12–46)
Lymphs Abs: 0.4 10*3/uL — ABNORMAL LOW (ref 0.7–4.0)
MCH: 26.8 pg (ref 26.0–34.0)
MCHC: 33.2 g/dL (ref 30.0–36.0)
MCV: 80.7 fL (ref 78.0–100.0)
MONOS PCT: 6 % (ref 3–12)
Monocytes Absolute: 0.1 10*3/uL (ref 0.1–1.0)
Neutro Abs: 1.7 10*3/uL (ref 1.7–7.7)
Neutrophils Relative %: 73 % (ref 43–77)
Platelets: 40 10*3/uL — ABNORMAL LOW (ref 150–400)
RBC: 3.21 MIL/uL — ABNORMAL LOW (ref 3.87–5.11)
RDW: 16.1 % — ABNORMAL HIGH (ref 11.5–15.5)
Smear Review: DECREASED
WBC: 2.3 10*3/uL — ABNORMAL LOW (ref 4.0–10.5)

## 2013-10-15 LAB — GLUCOSE, CAPILLARY
GLUCOSE-CAPILLARY: 238 mg/dL — AB (ref 70–99)
Glucose-Capillary: 215 mg/dL — ABNORMAL HIGH (ref 70–99)
Glucose-Capillary: 289 mg/dL — ABNORMAL HIGH (ref 70–99)
Glucose-Capillary: 309 mg/dL — ABNORMAL HIGH (ref 70–99)
Glucose-Capillary: 173 mg/dL — ABNORMAL HIGH (ref 70–99)

## 2013-10-15 LAB — PROTIME-INR
INR: 1.48 (ref 0.00–1.49)
Prothrombin Time: 17.9 seconds — ABNORMAL HIGH (ref 11.6–15.2)

## 2013-10-15 LAB — BODY FLUID CULTURE: Culture: NO GROWTH

## 2013-10-15 MED ORDER — INSULIN GLARGINE 100 UNIT/ML ~~LOC~~ SOLN
4.0000 [IU] | Freq: Every day | SUBCUTANEOUS | Status: DC
  Administered 2013-10-15 – 2013-10-16 (×2): 4 [IU] via SUBCUTANEOUS
  Filled 2013-10-15 (×3): qty 0.04

## 2013-10-15 NOTE — Consult Note (Signed)
Reason for Consult:SBP  [name] is an 53 y.o. female.   HPI:  Referring Physician: Admitted thru the ED with abdominal pain. The pain started Friday. She says the pain was unbearable. She says her abdomen was really not distended. Up until Friday she felt fine.  She underwent a paracentesis with 1200 cc obtained. A low grade fever was associated with her symptoms. Nausea and vomiting associated with her symptoms.  Hx of Hepatitis C and end stage liver disease.  Seen at [facility] Thursday. Apparently Hospitalist has talked with Dr. Julieta Gutting. Today she says feels somewhat better. Fluid analysis WBC ct 8008, Neutrophil ct 93. Peritoneal culture:  Specimen Description  PERITONEAL FLUID   Special Requests  NONE   Gram Stain  FEW WBC PRESENT,BOTH PMN AND MONONUCLEAR NO ORGANISMS SEEN Performed at [facility]  Culture  NO GROWTH 3 DAYS Performed at [facility]  Report Status  PENDING     Specimen Description  PERITONEAL FLUID   Special Requests  NONE   Gram Stain  FEW WBC PRESENT,BOTH PMN AND MONONUCLEAR NO ORGANISMS SEEN Performed at [facility]  Culture  NO GROWTH 3 DAYS Performed at [facility]  Report Status  PENDING      Past Medical History  Diagnosis Date  . Esophageal varices   . Cirrhosis     non alcoholic  . Ascites   . Anemia   . Liver failure     "I'm on liver transplant list @ [facility]" (11/07/2012)  . Portal vein thrombosis   . Complication of anesthesia   . PONV (postoperative nausea and vomiting)   . Heart murmur   . Type II diabetes mellitus     "not since gastric bypass" (11/07/2012)  . Hepatitis C     Hx: of Hep "C" it was eradicated  . AOVHDPGM(957.0)     "monthly" (11/07/2012)  . Anxiety   . Depression   . Kidney stones     Past Surgical History  Procedure Laterality Date  . Abdominal hysterectomy  2003  . Upper gastrointestinal endoscopy    . Colonoscopy    . Gastric bypass  ~ 1995  . Cesarean  section  1995  . Lithotripsy      "several times" (11/07/2012)  . Knee arthroscopy Right   . Colonoscopy  10/21/2011    Procedure: COLONOSCOPY;  Surgeon: Malissa Hippo, MD;  Location: [facility] ENDO SUITE;  Service: Endoscopy;  Laterality: N/A;  245   . Cystoscopy with stent placement  12/08/2011    Procedure: CYSTOSCOPY WITH STENT PLACEMENT;  Surgeon: Ky Barban, MD;  Location: [facility] ORS;  Service: Urology;  Laterality: Right;  . Cystoscopy w/ retrogrades  12/08/2011    Procedure: CYSTOSCOPY WITH RETROGRADE PYELOGRAM;  Surgeon: Ky Barban, MD;  Location: [facility] ORS;  Service: Urology;  Laterality: Right;  . Dilation and curettage of uterus    . Orif distal radius fracture Right 11/07/2012  . Cholecystectomy  1987  . Hernia repair  04/14/11    "one in my bellybutton" (11/07/2012)  . Inguinal hernia repair Right     "maybe 2" (11/07/2012)  . Open reduction internal fixation (orif) distal radial fracture Right 11/07/2012    Procedure: OPEN REDUCTION INTERNAL FIXATION (ORIF) RIGHT DISTAL RADIAL FRACTURE;  Surgeon: Sharma Covert, MD;  Location: [facility] OR;  Service: Orthopedics;  Laterality: Right;    Family History  Problem Relation Age of Onset  . Dementia Mother   . Heart disease  Father   . Liver disease Father   . Hypertension Father   . Diabetes Father   . Dementia Father   . Hypertension Brother   . Other Son     Cervical dystonia    Social History:  reports that she quit smoking about 22 years ago. Her smoking use included Cigarettes. She has a 2.5 pack-year smoking history. She has never used smokeless tobacco. She reports that she does not drink alcohol or use illicit drugs.  Allergies:  Allergies  Allergen Reactions  . Ancef [Cefazolin Sodium] Other (See Comments)    "Blisters in mouth and vagina"  . Cefazolin Rash    Blisters in mouth and vagina Blisters in mouth and vagina    Medications: I have reviewed the patient's current medications.  Results for orders placed  during the hospital encounter of 10/11/13 (from the past 48 hour(s))  COMPREHENSIVE METABOLIC PANEL     Status: Abnormal   Collection Time    10/14/13  5:51 AM      Result Value Ref Range   Sodium 139  137 - 147 mEq/L   Potassium 3.5 (*) 3.7 - 5.3 mEq/L   Chloride 100  96 - 112 mEq/L   CO2 27  19 - 32 mEq/L   Glucose, Bld 195 (*) 70 - 99 mg/dL   BUN 13  6 - 23 mg/dL   Creatinine, Ser 0.34  0.50 - 1.10 mg/dL   Calcium 8.5  8.4 - 74.2 mg/dL   Total Protein 6.4  6.0 - 8.3 g/dL   Albumin 3.8  3.5 - 5.2 g/dL   AST 20  0 - 37 U/L   ALT 35  0 - 35 U/L   Alkaline Phosphatase 84  39 - 117 U/L   Total Bilirubin 1.1  0.3 - 1.2 mg/dL   GFR calc non Af Amer 69 (*) >90 mL/min   GFR calc Af Amer 79 (*) >90 mL/min   Comment: (NOTE)     The eGFR has been calculated using the CKD EPI equation.     This calculation has not been validated in all clinical situations.     eGFR's persistently <90 mL/min signify possible Chronic Kidney     Disease.   Anion gap 12  5 - 15  PROTIME-INR     Status: Abnormal   Collection Time    10/14/13  5:51 AM      Result Value Ref Range   Prothrombin Time 18.9 (*) 11.6 - 15.2 seconds   INR 1.58 (*) 0.00 - 1.49  CBC WITH DIFFERENTIAL     Status: Abnormal   Collection Time    10/14/13  5:51 AM      Result Value Ref Range   WBC 3.2 (*) 4.0 - 10.5 K/uL   RBC 3.12 (*) 3.87 - 5.11 MIL/uL   Hemoglobin 8.2 (*) 12.0 - 15.0 g/dL   HCT 59.5 (*) 63.8 - 75.6 %   MCV 81.4  78.0 - 100.0 fL   MCH 26.3  26.0 - 34.0 pg   MCHC 32.3  30.0 - 36.0 g/dL   RDW 43.3 (*) 29.5 - 18.8 %   Platelets 37 (*) 150 - 400 K/uL   Comment: CONSISTENT WITH PREVIOUS RESULT     SPECIMEN CHECKED FOR CLOTS   Neutrophils Relative % 80 (*) 43 - 77 %   Neutro Abs 2.6  1.7 - 7.7 K/uL   Lymphocytes Relative 10 (*) 12 - 46 %   Lymphs Abs 0.3 (*)  0.7 - 4.0 K/uL   Monocytes Relative 8  3 - 12 %   Monocytes Absolute 0.3  0.1 - 1.0 K/uL   Eosinophils Relative 1  0 - 5 %   Eosinophils Absolute 0.0  0.0 -  0.7 K/uL   Basophils Relative 0  0 - 1 %   Basophils Absolute 0.0  0.0 - 0.1 K/uL  GLUCOSE, CAPILLARY     Status: Abnormal   Collection Time    10/14/13  5:02 PM      Result Value Ref Range   Glucose-Capillary 221 (*) 70 - 99 mg/dL   Comment 1 Notify RN    GLUCOSE, CAPILLARY     Status: Abnormal   Collection Time    10/14/13  9:15 PM      Result Value Ref Range   Glucose-Capillary 195 (*) 70 - 99 mg/dL   Comment 1 Notify RN    COMPREHENSIVE METABOLIC PANEL     Status: Abnormal   Collection Time    10/15/13  5:44 AM      Result Value Ref Range   Sodium 137  137 - 147 mEq/L   Potassium 3.3 (*) 3.7 - 5.3 mEq/L   Chloride 96  96 - 112 mEq/L   CO2 29  19 - 32 mEq/L   Glucose, Bld 267 (*) 70 - 99 mg/dL   BUN 14  6 - 23 mg/dL   Creatinine, Ser 1.03  0.50 - 1.10 mg/dL   Calcium 8.9  8.4 - 10.5 mg/dL   Total Protein 6.7  6.0 - 8.3 g/dL   Albumin 3.8  3.5 - 5.2 g/dL   AST 27  0 - 37 U/L   ALT 38 (*) 0 - 35 U/L   Alkaline Phosphatase 118 (*) 39 - 117 U/L   Total Bilirubin 1.1  0.3 - 1.2 mg/dL   GFR calc non Af Amer 61 (*) >90 mL/min   GFR calc Af Amer 71 (*) >90 mL/min   Comment: (NOTE)     The eGFR has been calculated using the CKD EPI equation.     This calculation has not been validated in all clinical situations.     eGFR's persistently <90 mL/min signify possible Chronic Kidney     Disease.   Anion gap 12  5 - 15  PROTIME-INR     Status: Abnormal   Collection Time    10/15/13  5:44 AM      Result Value Ref Range   Prothrombin Time 17.9 (*) 11.6 - 15.2 seconds   INR 1.48  0.00 - 1.49  CBC WITH DIFFERENTIAL     Status: Abnormal   Collection Time    10/15/13  5:44 AM      Result Value Ref Range   WBC 2.3 (*) 4.0 - 10.5 K/uL   RBC 3.21 (*) 3.87 - 5.11 MIL/uL   Hemoglobin 8.6 (*) 12.0 - 15.0 g/dL   HCT 25.9 (*) 36.0 - 46.0 %   MCV 80.7  78.0 - 100.0 fL   MCH 26.8  26.0 - 34.0 pg   MCHC 33.2  30.0 - 36.0 g/dL   RDW 16.1 (*) 11.5 - 15.5 %   Platelets 40 (*) 150 - 400  K/uL   Comment: SPECIMEN CHECKED FOR CLOTS     CONSISTENT WITH PREVIOUS RESULT   Neutrophils Relative % 73  43 - 77 %   Neutro Abs 1.7  1.7 - 7.7 K/uL   Lymphocytes Relative 18  12 - 46 %  Lymphs Abs 0.4 (*) 0.7 - 4.0 K/uL   Monocytes Relative 6  3 - 12 %   Monocytes Absolute 0.1  0.1 - 1.0 K/uL   Eosinophils Relative 2  0 - 5 %   Eosinophils Absolute 0.1  0.0 - 0.7 K/uL   Basophils Relative 0  0 - 1 %   Basophils Absolute 0.0  0.0 - 0.1 K/uL   Smear Review PLATELETS APPEAR DECREASED    GLUCOSE, CAPILLARY     Status: Abnormal   Collection Time    10/15/13  7:23 AM      Result Value Ref Range   Glucose-Capillary 238 (*) 70 - 99 mg/dL   Comment 1 Notify RN      Ct Abdomen Pelvis Wo Contrast  10/14/2013   CLINICAL DATA:  Abdominal pain and ascites. Small bowel obstruction. Awaiting liver transplant.  EXAM: CT ABDOMEN AND PELVIS WITHOUT CONTRAST  TECHNIQUE: Multidetector CT imaging of the abdomen and pelvis was performed following the standard protocol without IV contrast.  COMPARISON:  05/28/2013  FINDINGS: The lung bases demonstrate bibasilar linear opacification likely atelectasis.  Abdominal images demonstrate evidence of a prior cholecystectomy as well as evidence of patient's previous gastric bypass surgery. Liver is somewhat small and nodular compatible with known cirrhosis. There is stable moderate splenomegaly. Varices are present over the upper abdomen. Evidence of recanalization of the umbilical vein and suggestion of mild cavernous transformation of the portal vein. There is mild to moderate ascites improved compared to the prior exam. The presence of ascites lower sensitivity to detect a focal inflammatory process. The pancreas and adrenal glands are unremarkable.  Left kidney is within normal. Right kidney demonstrates multiple stones with the largest over the upper pole measuring 7 mm. There is mild prominence of the right intrarenal collecting system. There are couple tiny  calcifications within the pelvis which appear to be extrinsic to the distal ureters.  There is mild stable wall thickening of the distal stomach. The appendix is within normal. Apparent stool present throughout the colon. There is no evidence of bowel obstruction.  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 The bladder and rectum are unremarkable. Remainder the exam is unchanged.  IMPRESSION: Constellation findings compatible with known cirrhosis including portal venous hypertension/varices, moderate splenomegaly and ascites. The mild to moderate ascites is somewhat improved compared to the prior exam.  Moderate right-sided nephrolithiasis with minimal dilatation of the right intrarenal collecting system but no evidence of ureteral stones.  No evidence of bowel obstruction.  Minimal bibasilar atelectasis.  Postsurgical changes as described.   Electronically Signed   By: Marin Olp M.D.   On: 10/14/2013 17:17    ROS Blood pressure 107/52, pulse 62, temperature 98.2 F (36.8 C), temperature source Oral, resp. rate 18, height $RemoveBe'5\' 6"'[id number]$  (1.676 m), weight 148 lb 6.4 oz (67.314 kg), SpO2 93.00%. Physical Exam Alert and oriented. Skin warm and dry. Oral mucosa is moist.   . Sclera anicteric, conjunctivae is pink. Thyroid not enlarged. No cervical lymphadenopathy. Lungs clear. Heart regular rate and rhythm. Loud murmur heard.  Abdomen is soft. Bowel sounds are positive.  . No abdominal masses felt. Tenderness rt upper quadrant..  No edema to lower extremities.    Assessment/Plan: SBP. Culture revealed few WBC. Fluid cell ct elevated. Wil discuss with Dr. Laural Golden. Scheduled for repeat Paracentesis today. Agreen with Zosyn.  SETZER,TERRI W 10/15/2013, 8:16 AM     GI attending note; Patient interviewed and examined; Patient is 53 year old Caucasian female who  has advanced cirrhosis secondary to chronic hepatitis C which was successfully treated over 10 years ago who presents with spontaneous  bacterial peritonitis while on SBP prophylaxis with Bactrim DS. She was treated for SBP back in April this year while she was on Cipro. She feels better. She is having less pain and less nausea. She continues to feel weak which is a chronic symptom. Abdominal exam pertinent for tenderness in right upper quadrant and very large spleen. Repeat ultrasound reveals very small amount of ascites not am unable to paracentesis. Therefore we cannot document a drop in cell count treatment but clearly she is responding to antibiotic therapy. She will need total of 5 days of IV antibiotic therapy prior to discharge. Will consider switching to norfloxacin 400 mg daily for SBP prophylaxis when patient goes home.

## 2013-10-15 NOTE — Plan of Care (Signed)
Problem: Phase III Progression Outcomes Goal: Pain controlled on oral analgesia Outcome: Not Progressing Patient still needs IV pain medication to relieve pain.

## 2013-10-15 NOTE — Progress Notes (Signed)
Note: This document was prepared with digital dictation and possible smart phrase technology. Any transcriptional errors that result from this process are unintentional.   [name] [id number] DOB: December 25, 1960 DOA: 10/11/2013 PCP: Glo Herring., MD  Brief narrative: 53 y/o ?, h/o chronic hep C s/p Rx + cirrhosis, prior h/o Hep encephalopathy-On transplant list at [place] curl coordinator[facility], Portal vein thrombosis, Esoph Varices, Colon 10/2011 2 adenoma +  Diverticula/hemorrhoids, h/o Peritonitis in 05/2013 despite being on Cipro re-presented to [facility] 10/11/13 while on Bactrim with Abd pain, subj fever and chills. She initially was started on IV Levaquin and kept on her prophylactic Bactrim but because of abdominal pain, was switched to IV Zosyn on 10/14/13 as she has had occurrences of peritonitis despite being on antibiotics in the past with potentially resistant organisms   Past medical history-As per Problem list Chart reviewed as below- Reviewed from care everywhere  CIRRHOSIS CARE: 1. EGD: 09/19/13. No varices; on NSBB. 2. Imaging: MRI 02/27/2013. No HCC. 3. Vaccination: Immune to A, received 2 of 3 vaccinations for B. 4. Bone density: 02/26/2013. Osteopenia. 5. Transplant: Approved but not listed secondary to low MELD score (11).  Consultants:   GI  Procedures:  none  Antibiotics:  IV levaquin   Subjective   Doing fair Pain in abdomen is better Tolerating diet today Upset about some family events and tearful no nausea no vomiting today    Objective    Interim History:   Telemetry: Sinus   Objective: Filed Vitals:   10/14/13 1440 10/14/13 2104 10/15/13 0503 10/15/13 1424  BP: 123/61 118/58 107/52 111/55  Pulse: 56 69 62 62  Temp: 98.2 F (36.8 C) 98 F (36.7 C) 98.2 F (36.8 C) 98.1 F (36.7 C)  TempSrc: Oral Oral Oral Oral  Resp: 18 20 18 20   Height:      Weight:   67.314 kg (148 lb 6.4 oz)   SpO2: 97% 95% 93% 97%    Intake/Output  Summary (Last 24 hours) at 10/15/13 1426 Last data filed at 10/15/13 1300  Gross per 24 hour  Intake    340 ml  Output   1850 ml  Net  -1510 ml    Exam:  General: Alert and little tearful Cardiovascular: S1-S2 no murmur rub or gallop Respiratory: Clinically clear Abdomen: Distended+ tender in lower quadrants  Skin no lower extremity edema Neuro grossly intact no asterixis power 5/5 point reflexes 2/3  Data Reviewed: Basic Metabolic Panel:  Recent Labs Lab 10/11/13 1525 10/12/13 0613 10/13/13 0607 10/14/13 0551 10/15/13 0544  NA 136* 133* 134* 139 137  K 3.8 3.8 3.8 3.5* 3.3*  CL 98 99 98 100 96  CO2 24 24 24 27 29   GLUCOSE 230* 243* 226* 195* 267*  BUN 12 13 14 13 14   CREATININE 0.86 0.81 0.82 0.94 1.03  CALCIUM 9.0 7.9* 8.2* 8.5 8.9   Liver Function Tests:  Recent Labs Lab 10/11/13 1620 10/13/13 0607 10/14/13 0551 10/15/13 0544  AST 31 29 20 27   ALT 34 48* 35 38*  ALKPHOS 84 83 84 118*  BILITOT 1.3* 1.1 1.1 1.1  PROT 6.5 6.3 6.4 6.7  ALBUMIN 3.2* 3.3* 3.8 3.8    Recent Labs Lab 10/11/13 1620  LIPASE 33    Recent Labs Lab 10/11/13 1620  AMMONIA 55   CBC:  Recent Labs Lab 10/11/13 1525 10/12/13 0613 10/12/13 1511 10/14/13 0551 10/15/13 0544  WBC 7.0 5.2 5.2 3.2* 2.3*  NEUTROABS 6.4 4.4 4.3 2.6 1.7  HGB 9.9* 8.3* 8.9* 8.2* 8.6*  HCT 30.8* 26.0* 27.9* 25.4* 25.9*  MCV 82.4 82.8 83.3 81.4 80.7  PLT 48* 36* 40* 37* 40*   Cardiac Enzymes: No results found for this basename: CKTOTAL, CKMB, CKMBINDEX, TROPONINI,  in the last 168 hours BNP: No components found with this basename: POCBNP,  CBG:  Recent Labs Lab 10/14/13 1702 10/14/13 2115 10/15/13 0723 10/15/13 0859 10/15/13 1126  GLUCAP 221* 195* 238* 215* 289*    Recent Results (from the past 240 hour(s))  CULTURE, BLOOD (ROUTINE X 2)     Status: None   Collection Time    10/11/13  4:20 PM      Result Value Ref Range Status   Specimen Description BLOOD RIGHT WRIST   Final    Special Requests BOTTLES DRAWN AEROBIC AND ANAEROBIC 12CC   Final   Culture  Setup Time     Final   Value: 10/12/2013 19:53     Performed at [facility]   Culture     Final   Value: VIRIDANS STREPTOCOCCUS     Note: THE SIGNIFICANCE OF ISOLATING THIS ORGANISM FROM A SINGLE VENIPUNCTURE CANNOT BE PREDICTED WITHOUT FURTHER CLINICAL AND CULTURE CORRELATION. SUSCEPTIBILITIES AVAILABLE ONLY ON REQUEST.     Note: Gram Stain Report Called to,Read Back By and Verified With: WRIGHT M. AT 0830A ON [id number] BY THOMPSON S. Performed at [facility]     Performed at [facility]   Report Status 10/13/2013 FINAL   Final  BODY FLUID CULTURE     Status: None   Collection Time    10/11/13  4:50 PM      Result Value Ref Range Status   Specimen Description PERITONEAL FLUID   Final   Special Requests NONE   Final   Gram Stain     Final   Value: FEW WBC PRESENT,BOTH PMN AND MONONUCLEAR     NO ORGANISMS SEEN     Performed at [facility]   Culture     Final   Value: NO GROWTH 3 DAYS     Performed at [facility]   Report Status 10/15/2013 FINAL   Final  CULTURE, BLOOD (ROUTINE X 2)     Status: None   Collection Time    10/11/13  5:22 PM      Result Value Ref Range Status   Specimen Description BLOOD RIGHT WRIST   Final   Special Requests BOTTLES DRAWN AEROBIC AND ANAEROBIC 10CC   Final   Culture NO GROWTH 4 DAYS   Final   Report Status PENDING   Incomplete     Studies:              All Imaging reviewed and is as per above notation   Scheduled Meds: . baclofen  10 mg Oral BID  . ferrous sulfate  325 mg Oral BID WC  . furosemide  80 mg Oral Daily  . insulin aspart  0-9 Units Subcutaneous TID WC  . insulin aspart  3 Units Subcutaneous TID WC  . midodrine  5 mg Oral TID WC  . multivitamin with minerals  1 tablet Oral Daily  . pantoprazole  40 mg Oral Daily  . piperacillin-tazobactam (ZOSYN)  IV  3.375 g Intravenous Q8H  . rifaximin  550 mg Oral BID  .  spironolactone  300 mg Oral Daily  . sulfamethoxazole-trimethoprim  1 tablet Oral Daily  . traZODone  50 mg Oral QHS   Continuous Infusions:  Assessment/Plan: 1. Spontaneous bacterial peritonitis-peritoneal fluid shows 8 thousand WBCs- levofloxacin IV-->Zosyn.  Cont. Bactrim 1 tab daily suppressive therapy as well as rifaximin 550 twice a day.  Viridans strep on anaerobic culture ins contaminant. 9/4 peritoneal fluid culture no growth . For abdominal pain we will increase her morphine 2-3 mg every 2 when necessary-given nonresolution of her abdominal pain. CT scan with oral contrast 9/8 cirrhosis with moderate nephrolithiasis and no stones -for repeat paracentesis today. Appreciate GI input  2. 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 Blood pressure low nl-continue Aldactone 300.  Lasix 80 .  She will continue her lactulose 30 3 times a day.  d/w Dr. Monica Martinez at [facility] [place] [phone].  recommended Albumin 75 bid 9/5.  She will receive albumin 50 mg after this paracentesis .   Nadolol  has been held given slightly low blood sugar-patient was started on  Midodrine as per [facility] gastroenterologist .  weight has been steady in the 148 range for this admission  3. Diet-controlled diabetes mellitus-patient noncompliant on diabetic regimen and wishes to drink regular soda. Sugars stated above 200-continue her ADA CHF coverage and add 4 units Lantus  4. Pruritus-continue atarax 25 every 6 when necessary 5. Chronic abd pain, continue baclofen 10 twice a day, Flexeril 10 3 times a day.   6. Reflux, prior variceal bleed-continue Protonix 40 daily  Code Status: Full  Family Communication:  no family bedside-discussed with family 10/14/13  Disposition Plan: Inpatient  *Please note that the hospitalists switch teams on Wednesdays. Please call the flow manager at [phone] if you are having difficulty reaching the hospitalist taking care of this patient  as she can update you and provide the most up-to-date pager number of provider caring for the patient.   Verneita Griffes, MD  [facility] Pager [phone] 10/15/2013, 2:26 PM    LOS: 4 days

## 2013-10-16 DIAGNOSIS — D696 Thrombocytopenia, unspecified: Secondary | ICD-10-CM

## 2013-10-16 DIAGNOSIS — B182 Chronic viral hepatitis C: Secondary | ICD-10-CM

## 2013-10-16 DIAGNOSIS — R188 Other ascites: Secondary | ICD-10-CM

## 2013-10-16 DIAGNOSIS — K7460 Unspecified cirrhosis of liver: Secondary | ICD-10-CM

## 2013-10-16 DIAGNOSIS — K652 Spontaneous bacterial peritonitis: Secondary | ICD-10-CM

## 2013-10-16 DIAGNOSIS — R7309 Other abnormal glucose: Secondary | ICD-10-CM

## 2013-10-16 LAB — GLUCOSE, CAPILLARY
GLUCOSE-CAPILLARY: 203 mg/dL — AB (ref 70–99)
GLUCOSE-CAPILLARY: 390 mg/dL — AB (ref 70–99)
GLUCOSE-CAPILLARY: 300 mg/dL — AB (ref 70–99)
Glucose-Capillary: 152 mg/dL — ABNORMAL HIGH (ref 70–99)

## 2013-10-16 LAB — CULTURE, BLOOD (ROUTINE X 2): Culture: NO GROWTH

## 2013-10-16 LAB — PATHOLOGIST SMEAR REVIEW

## 2013-10-16 MED ORDER — PROMETHAZINE HCL 25 MG/ML IJ SOLN
6.2500 mg | Freq: Four times a day (QID) | INTRAMUSCULAR | Status: DC | PRN
  Administered 2013-10-16 – 2013-10-17 (×2): 6.25 mg via INTRAVENOUS
  Filled 2013-10-16 (×3): qty 1

## 2013-10-16 MED ORDER — INSULIN ASPART 100 UNIT/ML ~~LOC~~ SOLN: 0.0000 [IU] | Freq: Every day | SUBCUTANEOUS | Status: DC

## 2013-10-16 MED ORDER — MORPHINE SULFATE 2 MG/ML IJ SOLN
1.0000 mg | INTRAMUSCULAR | Status: DC | PRN
  Administered 2013-10-16 (×2): 1 mg via INTRAVENOUS
  Filled 2013-10-16 (×2): qty 1

## 2013-10-16 MED ORDER — INSULIN ASPART 100 UNIT/ML ~~LOC~~ SOLN
0.0000 [IU] | Freq: Three times a day (TID) | SUBCUTANEOUS | Status: DC
  Administered 2013-10-17: 3 [IU] via SUBCUTANEOUS
  Administered 2013-10-17: 11 [IU] via SUBCUTANEOUS
  Administered 2013-10-17: 8 [IU] via SUBCUTANEOUS

## 2013-10-16 MED ORDER — INSULIN GLARGINE 100 UNIT/ML ~~LOC~~ SOLN
15.0000 [IU] | Freq: Every day | SUBCUTANEOUS | Status: DC
  Administered 2013-10-17: 15 [IU] via SUBCUTANEOUS
  Filled 2013-10-16 (×2): qty 0.15

## 2013-10-16 NOTE — Progress Notes (Signed)
ANTIBIOTIC CONSULT NOTE  Pharmacy Consult for Zosyn Indication: peritonitis  Allergies  Allergen Reactions  . Ancef [Cefazolin Sodium] Other (See Comments)    "Blisters in mouth and vagina" Tolerates Penicillins    Patient Measurements: Height: 5\' 6"  (167.6 cm) Weight: 145 lb 9.6 oz (66.044 kg) IBW/kg (Calculated) : 59.3  Vital Signs: Temp: 98.1 F (36.7 C) (09/09 0628) Temp src: Oral (09/09 0628) BP: 99/52 mmHg (09/09 0628) Pulse Rate: 54 (09/09 0628) Intake/Output from previous day: 09/08 0701 - 09/09 0700 In: 530 [P.O.:480; IV Piggyback:50] Out: 1500 [Urine:1500] Intake/Output from this shift:    Labs:  Recent Labs  10/14/13 0551 10/15/13 0544  WBC 3.2* 2.3*  HGB 8.2* 8.6*  PLT 37* 40*  CREATININE 0.94 1.03   Estimated Creatinine Clearance: 59.8 ml/min (by C-G formula based on Cr of 1.03). No results found for this basename: VANCOTROUGH, VANCOPEAK, VANCORANDOM, GENTTROUGH, GENTPEAK, GENTRANDOM, TOBRATROUGH, TOBRAPEAK, TOBRARND, AMIKACINPEAK, AMIKACINTROU, AMIKACIN,  in the last 72 hours   Microbiology: Recent Results (from the past 720 hour(s))  CULTURE, BLOOD (ROUTINE X 2)     Status: None   Collection Time    10/11/13  4:20 PM      Result Value Ref Range Status   Specimen Description BLOOD RIGHT WRIST   Final   Special Requests BOTTLES DRAWN AEROBIC AND ANAEROBIC 12CC   Final   Culture  Setup Time     Final   Value: 10/12/2013 19:53     Performed at [facility]   Culture     Final   Value: VIRIDANS STREPTOCOCCUS     Note: THE SIGNIFICANCE OF ISOLATING THIS ORGANISM FROM A SINGLE VENIPUNCTURE CANNOT BE PREDICTED WITHOUT FURTHER CLINICAL AND CULTURE CORRELATION. SUSCEPTIBILITIES AVAILABLE ONLY ON REQUEST.     Note: Gram Stain Report Called to,Read Back By and Verified With: WRIGHT M. AT 0830A ON [id number] BY THOMPSON S. Performed at [facility]     Performed at [facility]   Report Status 10/13/2013 FINAL   Final  BODY FLUID  CULTURE     Status: None   Collection Time    10/11/13  4:50 PM      Result Value Ref Range Status   Specimen Description PERITONEAL FLUID   Final   Special Requests NONE   Final   Gram Stain     Final   Value: FEW WBC PRESENT,BOTH PMN AND MONONUCLEAR     NO ORGANISMS SEEN     Performed at [facility]   Culture     Final   Value: NO GROWTH 3 DAYS     Performed at [facility]   Report Status 10/15/2013 FINAL   Final  CULTURE, BLOOD (ROUTINE X 2)     Status: None   Collection Time    10/11/13  5:22 PM      Result Value Ref Range Status   Specimen Description BLOOD RIGHT WRIST   Final   Special Requests BOTTLES DRAWN AEROBIC AND ANAEROBIC 10CC   Final   Culture NO GROWTH 4 DAYS   Final   Report Status PENDING   Incomplete    Medical History: Past Medical History  Diagnosis Date  . Esophageal varices   . Cirrhosis     non alcoholic  . Ascites   . Anemia   . Liver failure     "I'm on liver transplant list @ [facility]" (11/07/2012)  . Portal vein thrombosis   . Complication of anesthesia   .  PONV (postoperative nausea and vomiting)   . Heart murmur   . Type II diabetes mellitus     "not since gastric bypass" (11/07/2012)  . Hepatitis C     Hx: of Hep "C" it was eradicated  . [id number])     "monthly" (11/07/2012)  . Anxiety   . Depression   . Kidney stones     Medications:  Scheduled:  . baclofen  10 mg Oral BID  . ferrous sulfate  325 mg Oral BID WC  . furosemide  80 mg Oral Daily  . insulin aspart  0-9 Units Subcutaneous TID WC  . insulin aspart  3 Units Subcutaneous TID WC  . insulin glargine  4 Units Subcutaneous Daily  . midodrine  5 mg Oral TID WC  . multivitamin with minerals  1 tablet Oral Daily  . pantoprazole  40 mg Oral Daily  . piperacillin-tazobactam (ZOSYN)  IV  3.375 g Intravenous Q8H  . rifaximin  550 mg Oral BID  . spironolactone  300 mg Oral Daily  . traZODone  50 mg Oral QHS   Assessment: 53 yo F with end stage liver disease  on Zosyn for SBP which she is tolerating without any reaction.  Cefazolin allergy noted. She was on Bactrim for SBP px PTA. Renal function stable.  CrCl > 20 ml/min. Peritoneal fluid cx pending.   GI recommendations noted.    Zosyn 9/6>>  Goal of Therapy:  Eradicate infection  Plan:  Zosyn 3.375 GM IV every 8 hours, infuse each dose over 4 hours Monitor renal function and cx data  Duration of therapy per MD- current plan for Zosyn x 5 days (thru 9/10) then oral FQ   [name], Lavonia Drafts 10/16/2013,8:25 AM

## 2013-10-16 NOTE — Progress Notes (Addendum)
  Subjective:  Patient states she does not feel bad. She complains of nausea and poor appetite. She also complains of abdominal pain which is centered in the right upper quadrant. She denies melena or rectal bleeding. Patient states she is having hard time at home. She gets exhausted after minimal physical activity.     Objective: Blood pressure 99/52, pulse 54, temperature 98.1 F (36.7 C), temperature source Oral, resp. rate 20, height 5\' 6"  (1.676 m), weight 145 lb 9.6 oz (66.044 kg), SpO2 96.00%. Patient is alert and does not have asterixis. Patient is very upset and in tears because she is not sure that she will ever receive liver transplant. Conjunctiva is pink. Sclera is nonicteric Oropharyngeal mucosa is normal. No neck masses or thyromegaly noted. Cardiac exam with regular rhythm normal S1 and S2. No murmur or gallop noted. Lungs are clear to auscultation. Abdomen is full. Bowel sounds are normal. Abdomen is soft and very large spleen which is mildly tender. He also has tenderness below the right costal margin there is no guarding or rebound. No LE edema or clubbing noted.  Labs/studies Results:   Recent Labs  10/14/13 0551 10/15/13 0544  WBC 3.2* 2.3*  HGB 8.2* 8.6*  HCT 25.4* 25.9*  PLT 37* 40*    BMET   Recent Labs  10/14/13 0551 10/15/13 0544  NA 139 137  K 3.5* 3.3*  CL 100 96  CO2 27 29  GLUCOSE 195* 267*  BUN 13 14  CREATININE 0.94 1.03  CALCIUM 8.5 8.9    LFT   Recent Labs  10/14/13 0551 10/15/13 0544  PROT 6.4 6.7  ALBUMIN 3.8 3.8  AST 20 27  ALT 35 38*  ALKPHOS 84 118*  BILITOT 1.1 1.1    PT/INR   Recent Labs  10/14/13 0551 10/15/13 0544  LABPROT 18.9* 17.9*  INR 1.58* 1.48    Ascitic fluid cultures remain negative.  Assessment:  #1. Spontaneous bacterial peritonitis.(Culture negative neutrocytic ascites). Clinically she is responding to antibiotic therapy. Followup ultrasound yesterday revealed very small amount of ascites  not am unable to paracentesis. Patient had an episode of SBP in April, 2015 when antibiotic was changed from ciprofloxacin to Bactrim DS which obviously has not worked. Noroxin is not available in [place]. Therefore will switch to Levaquin for prophylaxis. #2. Decompensated liver disease with cirrhosis with low MELD score and she is therefore not an active list for liver transplant. #3. Pancytopenia secondary to cirrhosis and splenomegaly.   Recommendations;  Continue IV antibiotics through tomorrow morning. CBC, bilirubin, INR and metabolic 7 in a.m. Discharge on Levaquin 500 mg daily for prophylaxis but will also discuss with Dr. Monica Martinez who is patient's transplant hepatologist at [facility]

## 2013-10-16 NOTE — Progress Notes (Signed)
Inpatient Diabetes Program Recommendations  AACE/ADA: New Consensus Statement on Inpatient Glycemic Control (2013)  Target Ranges:  Prepandial:   less than 140 mg/dL      Peak postprandial:   less than 180 mg/dL (1-2 hours)      Critically ill patients:  140 - 180 mg/dL   Results for DAVEAH, KAPELA (MRN [id number]) as of 10/16/2013 11:16  Ref. Range 10/15/2013 07:23 10/15/2013 08:59 10/15/2013 11:26 10/15/2013 16:29 10/15/2013 21:44 10/16/2013 07:50  Glucose-Capillary Latest Range: 70-99 mg/dL 238 (H) 215 (H) 289 (H) 309 (H) 173 (H) 203 (H)   Diabetes history: DM2 Outpatient Diabetes medications: None Current orders for Inpatient glycemic control: Lantus 4 units daily, Novolog 0-9 units AC, Novolog 3 units AC with meals for meal coverage  Inpatient Diabetes Program Recommendations Insulin - Basal: Please consider increasing Lantus to 10 units daily (based on 66 kg x 0.15 units). If Lantus increased as requested, please also enter one-time order for Lantus 6 units for now since patient has already received Lantus 4 units earlier today. Correction (SSI): Please consider increasing Novolog correction to moderate scale and adding Novolog bedtime correction scale.  Thanks, Barnie Alderman, RN, MSN, CCRN Diabetes Coordinator Inpatient Diabetes Program [phone] (Team Pager) [phone] ([facility] office) [phone] [facility] office)

## 2013-10-16 NOTE — Progress Notes (Signed)
Note: This document was prepared with digital dictation and possible smart phrase technology. Any transcriptional errors that result from this process are unintentional.   [name] [id number] DOB: 02-Jan-1961 DOA: 10/11/2013 PCP: Glo Herring., MD  Brief narrative: 53 y/o ?, h/o chronic hep C s/p Rx + cirrhosis, prior h/o Hep encephalopathy-On transplant list at [place] [employer], Portal vein thrombosis, Esoph Varices, Colon 10/2011 2 adenoma +  Diverticula/hemorrhoids, h/o Peritonitis in 05/2013 despite being on Cipro re-presented to [facility] 10/11/13 while on Bactrim with Abd pain, subj fever and chills. She initially was started on IV Levaquin and kept on her prophylactic Bactrim but because of abdominal pain, was switched to IV Zosyn on 10/14/13 as she has had occurrences of peritonitis despite being on antibiotics in the past with potentially resistant organisms   Past medical history-As per Problem list Chart reviewed as below- Reviewed from care everywhere  CIRRHOSIS CARE: 1. EGD: 09/19/13. No varices; on NSBB. 2. Imaging: MRI 02/27/2013. No HCC. 3. Vaccination: Immune to A, received 2 of 3 vaccinations for B. 4. Bone density: 02/26/2013. Osteopenia. 5. Transplant: Approved but not listed secondary to low MELD score (11).  Consultants:   GI  Procedures:  none  Antibiotics:  IV levaquin   Subjective   Abdominal pain is still present. Feeling nauseous and having difficulty eating.    Objective    Interim History:   Telemetry: Sinus   Objective: Filed Vitals:   10/15/13 2030 10/16/13 0500 10/16/13 0628 10/16/13 1505  BP: 105/45  99/52 105/60  Pulse: 56  54 60  Temp: 97.8 F (36.6 C)  98.1 F (36.7 C) 98 F (36.7 C)  TempSrc: Oral  Oral Oral  Resp: 20  20 20   Height:      Weight:  66.044 kg (145 lb 9.6 oz)    SpO2: 100%  96% 97%    Intake/Output Summary (Last 24 hours) at 10/16/13 1950 Last data filed at 10/16/13 1350  Gross per 24  hour  Intake    290 ml  Output   2000 ml  Net  -1710 ml    Exam:  General: Alert and little tearful Cardiovascular: S1-S2 no murmur rub or gallop Respiratory: Clinically clear Abdomen: Distended, tender on right side, positive bowel sounds Skin no lower extremity edema Neuro grossly intact no asterixis power 5/5 point reflexes 2/3  Data Reviewed: Basic Metabolic Panel:  Recent Labs Lab 10/11/13 1525 10/12/13 0613 10/13/13 0607 10/14/13 0551 10/15/13 0544  NA 136* 133* 134* 139 137  K 3.8 3.8 3.8 3.5* 3.3*  CL 98 99 98 100 96  CO2 24 24 24 27 29   GLUCOSE 230* 243* 226* 195* 267*  BUN 12 13 14 13 14   CREATININE 0.86 0.81 0.82 0.94 1.03  CALCIUM 9.0 7.9* 8.2* 8.5 8.9   Liver Function Tests:  Recent Labs Lab 10/11/13 1620 10/13/13 0607 10/14/13 0551 10/15/13 0544  AST 31 29 20 27   ALT 34 48* 35 38*  ALKPHOS 84 83 84 118*  BILITOT 1.3* 1.1 1.1 1.1  PROT 6.5 6.3 6.4 6.7  ALBUMIN 3.2* 3.3* 3.8 3.8    Recent Labs Lab 10/11/13 1620  LIPASE 33    Recent Labs Lab 10/11/13 1620  AMMONIA 55   CBC:  Recent Labs Lab 10/11/13 1525 10/12/13 0613 10/12/13 1511 10/14/13 0551 10/15/13 0544  WBC 7.0 5.2 5.2 3.2* 2.3*  NEUTROABS 6.4 4.4 4.3 2.6 1.7  HGB 9.9* 8.3* 8.9* 8.2* 8.6*  HCT 30.8* 26.0* 27.9* 25.4*  25.9*  MCV 82.4 82.8 83.3 81.4 80.7  PLT 48* 36* 40* 37* 40*   Cardiac Enzymes: No results found for this basename: CKTOTAL, CKMB, CKMBINDEX, TROPONINI,  in the last 168 hours BNP: No components found with this basename: POCBNP,  CBG:  Recent Labs Lab 10/15/13 1629 10/15/13 2144 10/16/13 0750 10/16/13 1138 10/16/13 1655  GLUCAP 309* 173* 203* 390* 300*    Recent Results (from the past 240 hour(s))  CULTURE, BLOOD (ROUTINE X 2)     Status: None   Collection Time    10/11/13  4:20 PM      Result Value Ref Range Status   Specimen Description BLOOD RIGHT WRIST   Final   Special Requests BOTTLES DRAWN AEROBIC AND ANAEROBIC 12CC   Final    Culture  Setup Time     Final   Value: 10/12/2013 19:53     Performed at [facility]   Culture     Final   Value: VIRIDANS STREPTOCOCCUS     Note: THE SIGNIFICANCE OF ISOLATING THIS ORGANISM FROM A SINGLE VENIPUNCTURE CANNOT BE PREDICTED WITHOUT FURTHER CLINICAL AND CULTURE CORRELATION. SUSCEPTIBILITIES AVAILABLE ONLY ON REQUEST.     Note: Gram Stain Report Called to,Read Back By and Verified With: WRIGHT M. AT 0830A ON UG:[id number] BY THOMPSON S. Performed at [facility]     Performed at [facility]   Report Status 10/13/2013 FINAL   Final  BODY FLUID CULTURE     Status: None   Collection Time    10/11/13  4:50 PM      Result Value Ref Range Status   Specimen Description PERITONEAL FLUID   Final   Special Requests NONE   Final   Gram Stain     Final   Value: FEW WBC PRESENT,BOTH PMN AND MONONUCLEAR     NO ORGANISMS SEEN     Performed at [facility]   Culture     Final   Value: NO GROWTH 3 DAYS     Performed at [facility]   Report Status 10/15/2013 FINAL   Final  CULTURE, BLOOD (ROUTINE X 2)     Status: None   Collection Time    10/11/13  5:22 PM      Result Value Ref Range Status   Specimen Description BLOOD RIGHT WRIST   Final   Special Requests BOTTLES DRAWN AEROBIC AND ANAEROBIC 10CC   Final   Culture NO GROWTH 5 DAYS   Final   Report Status 10/16/2013 FINAL   Final     Studies:              All Imaging reviewed and is as per above notation   Scheduled Meds: . baclofen  10 mg Oral BID  . ferrous sulfate  325 mg Oral BID WC  . furosemide  80 mg Oral Daily  . insulin aspart  0-9 Units Subcutaneous TID WC  . insulin aspart  3 Units Subcutaneous TID WC  . insulin glargine  4 Units Subcutaneous Daily  . midodrine  5 mg Oral TID WC  . multivitamin with minerals  1 tablet Oral Daily  . pantoprazole  40 mg Oral Daily  . piperacillin-tazobactam (ZOSYN)  IV  3.375 g Intravenous Q8H  . rifaximin  550 mg Oral BID  . spironolactone  300 mg  Oral Daily  . traZODone  50 mg Oral QHS   Continuous Infusions:    Assessment/Plan: 1. Spontaneous bacterial peritonitis-peritoneal fluid shows 8  thousand WBCs- levofloxacin IV-->Zosyn.  Viridans strep on anaerobic culture ins contaminant. 9/4 peritoneal fluid culture no growth . Abdominal pain is slowly improving, but may be exacerbated by her underlying anxiety. CT scan with oral contrast 9/8 cirrhosis with moderate nephrolithiasis and no stones -repeat evaluation for paracentesis did not indicate an adequate amount of fluid to be drained. Appreciate GI input. Recommendation is to continue intravenous antibiotics through tomorrow. Plan is to transition to oral Levaquin for suppressive therapy 2. 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 Blood pressure low nl-continue Aldactone 300.  Lasix 80 .  She will continue her lactulose 30mg  3 times a day.  Dr. Verlon Au d/w Dr. Monica Martinez at [facility] [phone].  recommended Albumin 75 bid 9/5.  She will receive albumin 50 mg after this paracentesis .   Nadolol  has been held given slightly low blood sugar-patient was started on  Midodrine as per [facility] gastroenterologist .  weight has been steady in the 148 range for this admission  3. Diet-controlled diabetes mellitus-patient noncompliant on diabetic regimen and wishes to drink regular soda. Blood sugars are significantly elevated. We'll increase Lantus to 15 units daily. Continue NovoLog per sliding scale 4. Pruritus-continue atarax 25 every 6 when necessary 5. Chronic abd pain, continue baclofen 10 twice a day, Flexeril 10 3 times a day.   6. Reflux, prior variceal bleed-continue Protonix 40 daily  Code Status: Full  Family Communication:  Discussed with patient Disposition Plan: Inpatient  Kathie Dike, MD  [facility] Pager [phone]  10/16/2013, 7:50 PM    LOS: 5 days

## 2013-10-17 LAB — COMPREHENSIVE METABOLIC PANEL
ALT: 54 U/L — ABNORMAL HIGH (ref 0–35)
ANION GAP: 13 (ref 5–15)
AST: 50 U/L — ABNORMAL HIGH (ref 0–37)
Albumin: 3.8 g/dL (ref 3.5–5.2)
Alkaline Phosphatase: 124 U/L — ABNORMAL HIGH (ref 39–117)
BUN: 19 mg/dL (ref 6–23)
CALCIUM: 9.1 mg/dL (ref 8.4–10.5)
CO2: 31 meq/L (ref 19–32)
CREATININE: 1.19 mg/dL — AB (ref 0.50–1.10)
Chloride: 96 mEq/L (ref 96–112)
GFR calc non Af Amer: 52 mL/min — ABNORMAL LOW (ref >90)
GFR, EST AFRICAN AMERICAN: 60 mL/min — AB (ref >90)
GLUCOSE: 170 mg/dL — AB (ref 70–99)
Potassium: 3.4 mEq/L — ABNORMAL LOW (ref 3.7–5.3)
Sodium: 140 mEq/L (ref 137–147)
TOTAL PROTEIN: 6.9 g/dL (ref 6.0–8.3)
Total Bilirubin: 1.2 mg/dL (ref 0.3–1.2)

## 2013-10-17 LAB — GLUCOSE, CAPILLARY
GLUCOSE-CAPILLARY: 349 mg/dL — AB (ref 70–99)
Glucose-Capillary: 180 mg/dL — ABNORMAL HIGH (ref 70–99)
Glucose-Capillary: 277 mg/dL — ABNORMAL HIGH (ref 70–99)

## 2013-10-17 LAB — PROTIME-INR
INR: 1.48 (ref 0.00–1.49)
PROTHROMBIN TIME: 17.9 s — AB (ref 11.6–15.2)

## 2013-10-17 LAB — CBC
HCT: 28.1 % — ABNORMAL LOW (ref 36.0–46.0)
HEMOGLOBIN: 9 g/dL — AB (ref 12.0–15.0)
MCH: 26 pg (ref 26.0–34.0)
MCHC: 32 g/dL (ref 30.0–36.0)
MCV: 81.2 fL (ref 78.0–100.0)
Platelets: 45 10*3/uL — ABNORMAL LOW (ref 150–400)
RBC: 3.46 MIL/uL — AB (ref 3.87–5.11)
RDW: 16.2 % — ABNORMAL HIGH (ref 11.5–15.5)
WBC: 2.9 10*3/uL — ABNORMAL LOW (ref 4.0–10.5)

## 2013-10-17 MED ORDER — LIVING WELL WITH DIABETES BOOK
Freq: Once | Status: AC
  Administered 2013-10-17: 09:00:00
  Filled 2013-10-17: qty 1

## 2013-10-17 MED ORDER — OXYCODONE HCL 5 MG PO TABS: 5.0000 mg | ORAL_TABLET | Freq: Four times a day (QID) | ORAL | Status: DC | PRN

## 2013-10-17 MED ORDER — INSULIN GLARGINE 100 UNIT/ML SOLOSTAR PEN: 15.0000 [IU] | PEN_INJECTOR | Freq: Every day | SUBCUTANEOUS | Status: DC

## 2013-10-17 MED ORDER — LEVOFLOXACIN 500 MG PO TABS: 500.0000 mg | ORAL_TABLET | Freq: Every day | ORAL | Status: DC

## 2013-10-17 MED ORDER — INSULIN ASPART 100 UNIT/ML FLEXPEN: 7.0000 [IU] | PEN_INJECTOR | Freq: Three times a day (TID) | SUBCUTANEOUS | Status: DC

## 2013-10-17 MED ORDER — PROMETHAZINE HCL 12.5 MG PO TABS: 12.5000 mg | ORAL_TABLET | Freq: Four times a day (QID) | ORAL | Status: DC | PRN

## 2013-10-17 MED ORDER — MIDODRINE HCL 5 MG PO TABS: 5.0000 mg | ORAL_TABLET | Freq: Three times a day (TID) | ORAL | Status: DC

## 2013-10-17 NOTE — Discharge Summary (Signed)
Physician Discharge Summary  [name] [id number] DOB: 1960/06/12 DOA: 10/11/2013  PCP: Glo Herring., MD  Admit date: 10/11/2013 Discharge date: 10/17/2013  Time spent: 32minutes  Recommendations for Outpatient Follow-up:  1. Followup with gastroenterology in one to 2 weeks. She is followed at [facility] and [facility] for possible liver transplantation. 2. Followup with primary care physician in one to 2 weeks since she is a newly started on insulin. 3. Consider restarting nadolol as an outpatient if blood pressure will tolerate.  Discharge Diagnoses:  Principal Problem:   SBP (spontaneous bacterial peritonitis) Active Problems:   Cirrhosis of liver   Ascites   Chronic hepatitis C  diabetes, uncontrolled End-stage liver disease Chronic abdominal pain  Discharge Condition: Improved  Diet recommendation: Low salt, low carb  Filed Weights   10/15/13 0503 10/16/13 0500 10/17/13 0522  Weight: 67.314 kg (148 lb 6.4 oz) 66.044 kg (145 lb 9.6 oz) 67.3 kg (148 lb 5.9 oz)    History of present illness and hospital course:  53 year female with history of hepatitis C status post treatment and cirrhosis who is currently on transplant list at [facility] as well as [facility]. She has a history of peritonitis in 05/2013 despite being treated with Cipro, presented to an emergency room on 9/4 on Bactrim with complaints of abdominal pain fever and chills. 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 She was initially started on intravenous Levaquin but was subsequently changed to IV Zosyn. 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 She was followed by gastroenterology and complete an appropriate course of intravenous antibiotics. She's been transitioned to oral Levaquin for SBP prophylaxis. She will followup with her transplant team as [place] for further  management. During her hospital course, nadolol had to be discontinued due to hypotension and she was started on Midodrine. This can be reassessed as an outpatient as her blood pressure will tolerate. She was also noted to be significantly hyperglycemic. She was reportedly in controlled diabetic but blood sugars are running in the 300s. She has been started on insulin, Lantus and NovoLog which can be further adjusted by her primary care physician. She's been set up with home health services. She feels ready to discharge home.  Procedures:  Paracentesis 9/4  Consultations:  gastroenterology  Discharge Exam: Filed Vitals:   10/17/13 1508  BP: 113/58  Pulse: 57  Temp: 98.5 F (36.9 C)  Resp: 18    General: NAD Cardiovascular: s1, s2, rrr Respiratory: CTA B  Discharge Instructions You were cared for by a hospitalist during your hospital stay. If you have any questions about your discharge medications or the care you received while you were in the hospital after you are discharged, you can call the unit and asked to speak with the hospitalist on call if the hospitalist that took care of you is not available. Once you are discharged, your primary care physician will handle any further medical issues. Please note that NO REFILLS for any discharge medications will be authorized once you are discharged, as it is imperative that you return to your primary care physician (or establish a relationship with a primary care physician if you do not have one) for your aftercare needs so that they can reassess your need for medications and monitor your lab values.  Discharge Instructions   Call MD for:  persistant nausea and vomiting    Complete by:  As directed      Call MD for:  severe uncontrolled pain    Complete by:  As directed      Call MD for:  temperature >100.4    Complete by:  As directed      Diet - low sodium heart healthy    Complete by:  As directed      Diet Carb Modified    Complete  by:  As directed      Face-to-face encounter (required for Medicare/Medicaid patients)    Complete by:  As directed   I Ahri Olson certify that this patient is under my care and that I, or a nurse practitioner or physician's assistant working with me, had a face-to-face encounter that meets the physician face-to-face encounter requirements with this patient on 10/17/2013. The encounter with the patient was in whole, or in part for the following medical condition(s) which is the primary reason for home health care (List medical condition): patient is a diabetic that is new on insulin.  She has multiple co morbidities including advanced cirrhosis.  Would benefit from home health RN  The encounter with the patient was in whole, or in part, for the following medical condition, which is the primary reason for home health care:  spontaneous bacterial peritonitis  I certify that, based on my findings, the following services are medically necessary home health services:  Nursing  My clinical findings support the need for the above services:  Pain interferes with ambulation/mobility  Further, I certify that my clinical findings support that this patient is homebound due to:  Pain interferes with ambulation/mobility  Reason for Medically Necessary Home Health Services:  Skilled Nursing- Administration and Training of Injectable Medication     Home Health    Complete by:  As directed   To provide the following care/treatments:  RN     Increase activity slowly    Complete by:  As directed           Discharge Medication List as of 10/17/2013  4:38 PM    START taking these medications   Details  insulin aspart (NOVOLOG) 100 UNIT/ML FlexPen Inject 7 Units into the skin 3 (three) times daily with meals., Starting 10/17/2013, Until Discontinued, Print    Insulin Glargine (LANTUS) 100 UNIT/ML Solostar Pen Inject 15 Units into the skin daily at 10 pm., Starting 10/17/2013, Until Discontinued, Print     levofloxacin (LEVAQUIN) 500 MG tablet Take 1 tablet (500 mg total) by mouth daily., Starting 10/17/2013, Until Discontinued, Print    midodrine (PROAMATINE) 5 MG tablet Take 1 tablet (5 mg total) by mouth 3 (three) times daily with meals., Starting 10/17/2013, Until Discontinued, Print    oxyCODONE (ROXICODONE) 5 MG immediate release tablet Take 1 tablet (5 mg total) by mouth every 6 (six) hours as needed for severe pain., Starting 10/17/2013, Until Discontinued, Print    promethazine (PHENERGAN) 12.5 MG tablet Take 1 tablet (12.5 mg total) by mouth every 6 (six) hours as needed for nausea or vomiting., Starting 10/17/2013, Until Discontinued, Normal      CONTINUE these medications which have NOT CHANGED   Details  b complex vitamins tablet Take 1 tablet by mouth daily., Until Discontinued, Historical Med    baclofen (LIORESAL) 10 MG tablet Take 10 mg by mouth 2 (two) times daily. , Until Discontinued, Historical Med    cyclobenzaprine (FLEXERIL) 10 MG tablet Take 10 mg by mouth 3 (three) times daily as needed for muscle spasms. As needed, Starting 07/08/2013,  Until Discontinued, Historical Med    ferrous sulfate 325 (65 FE) MG tablet Take 325 mg by mouth 2 (two) times daily with a meal., Until Discontinued, Historical Med    furosemide (LASIX) 40 MG tablet Take 80 mg by mouth daily. , Until Discontinued, Historical Med    hydrOXYzine (ATARAX/VISTARIL) 25 MG tablet Take 25 mg by mouth daily as needed. For itchinng, Starting 09/04/2013, Until Discontinued, Historical Med    lactulose (CHRONULAC) 10 GM/15ML solution Take 45 mLs (30 g total) by mouth 3 (three) times daily., Starting 06/03/2013, Until Discontinued, Print    Multiple Vitamin (MULTIVITAMIN WITH MINERALS) TABS tablet Take 1 tablet by mouth daily., Until Discontinued, Historical Med    omeprazole (PRILOSEC) 20 MG capsule Take 1 capsule (20 mg total) by mouth 2 (two) times daily before a meal., Starting 12/24/2012, Until Discontinued,  Normal    rifaximin (XIFAXAN) 550 MG TABS Take 550 mg by mouth 2 (two) times daily., Until Discontinued, Historical Med    spironolactone (ALDACTONE) 100 MG tablet Take 300 mg by mouth daily. , Until Discontinued, Historical Med    traZODone (DESYREL) 50 MG tablet Take 1 tablet (50 mg total) by mouth at bedtime., Starting 09/25/2012, Until Discontinued, Normal      STOP taking these medications     nadolol (CORGARD) 20 MG tablet      sulfamethoxazole-trimethoprim (BACTRIM DS) 800-160 MG per tablet        Allergies  Allergen Reactions  . Ancef [Cefazolin Sodium] Other (See Comments)    "Blisters in mouth and vagina" Tolerates Penicillins   Follow-up Information   Follow up with Glo Herring., MD. Schedule an appointment as soon as possible for a visit in 2 weeks.   Specialty:  Internal Medicine   Contact information:   [address] [place] [id number] [phone]        The results of significant diagnostics from this hospitalization (including imaging, microbiology, ancillary and laboratory) are listed below for reference.    Significant Diagnostic Studies: Ct Abdomen Pelvis Wo Contrast  10/14/2013   CLINICAL DATA:  Abdominal pain and ascites. Small bowel obstruction. Awaiting liver transplant.  EXAM: CT ABDOMEN AND PELVIS WITHOUT CONTRAST  TECHNIQUE: Multidetector CT imaging of the abdomen and pelvis was performed following the standard protocol without IV contrast.  COMPARISON:  05/28/2013  FINDINGS: The lung bases demonstrate bibasilar linear opacification likely atelectasis.  Abdominal images demonstrate evidence of a prior cholecystectomy as well as evidence of patient's previous gastric bypass surgery. Liver is somewhat small and nodular compatible with known cirrhosis. There is stable moderate splenomegaly. Varices are present over the upper abdomen. 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 There is mild to moderate ascites improved compared to the prior exam. The presence of ascites lower sensitivity to detect a focal inflammatory process. The pancreas and adrenal glands are unremarkable.  Left kidney is within normal. Right kidney demonstrates multiple stones with the largest over the upper pole measuring 7 mm. There is mild prominence of the right intrarenal collecting system. There are couple tiny calcifications within the pelvis which appear to be extrinsic to the distal ureters.  There is mild stable wall thickening of the distal stomach. The appendix is within normal. Apparent stool present throughout the colon. There is no evidence of bowel obstruction.  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 The bladder and rectum are unremarkable. Remainder the exam is unchanged.  IMPRESSION: Constellation findings  compatible with known cirrhosis including portal venous hypertension/varices, moderate splenomegaly and ascites. The mild to moderate ascites is somewhat improved compared to the prior exam.  Moderate right-sided nephrolithiasis with minimal dilatation of the right intrarenal collecting system but no evidence of ureteral stones.  No evidence of bowel obstruction.  Minimal bibasilar atelectasis.  Postsurgical changes as described.   Electronically Signed   By: Marin Olp M.D.   On: 10/14/2013 17:17   US Abdomen Limited  10/15/2013   CLINICAL DATA:  Spontaneous bacterial peritonitis, on antibiotics, localization of fluid for paracentesis  EXAM: LIMITED ABDOMEN ULTRASOUND FOR ASCITES  TECHNIQUE: Limited ultrasound survey for ascites was performed in all four abdominal quadrants.  COMPARISON:  Ultrasound abdomen 10/11/2013, CT abdomen and pelvis 10/14/2013  FINDINGS: Mild scattered ascites.  Small pockets of fluid are seen in both flanks which are highly mobile and contain multiple mobile bowel loops.  No persistent fluid collection sufficient in size for paracentesis is  identified.  IMPRESSION: No adequate pocket of ascites is identified for paracentesis.   Electronically Signed   By: Lavonia Dana M.D.   On: 10/15/2013 15:59   US Paracentesis  10/15/2013   CLINICAL DATA:  Cirrhosis, fever, abdominal pain, question spontaneous bacterial peritonitis  EXAM: ULTRASOUND GUIDED DIAGNOSTIC AND THERAPEUTIC PARACENTESIS  COMPARISON:  None.  PROCEDURE: Procedure, benefits, and risks of procedure were discussed with patient.  Written informed consent for procedure was obtained.  Time out protocol followed.  Adequate collection of ascites localized by ultrasound in LEFT lower quadrant.  Skin prepped and draped in usual sterile fashion.  Skin and soft tissues anesthetized with 10 mL of 1% lidocaine.  5 [place] Yueh catheter placed into peritoneal cavity.  1200 mL of amber colored fluid aspirated by vacuum bottle suction.  Procedure tolerated well by patient without immediate complication.  FINDINGS: A total of approximately 1200 mL of ascitic fluid was removed. A fluid sample of 180 mL was sent for laboratory analysis.  IMPRESSION: Successful ultrasound guided paracentesis yielding 1200 mL of ascites.   Electronically Signed   By: Lavonia Dana M.D.   On: 10/15/2013 08:05   Dg Chest Port 1 View  10/11/2013   CLINICAL DATA:  Chest pain  EXAM: PORTABLE CHEST - 1 VIEW  COMPARISON:  Chest radiograph 11/07/2012  FINDINGS: Stable cardiac and mediastinal contours. No consolidative pulmonary opacities. No pleural effusion or pneumothorax. Regional skeleton unremarkable.  IMPRESSION: No acute cardiopulmonary process.   Electronically Signed   By: Lovey Newcomer M.D.   On: 10/11/2013 16:13    Microbiology: Recent Results (from the past 240 hour(s))  CULTURE, BLOOD (ROUTINE X 2)     Status: None   Collection Time    10/11/13  4:20 PM      Result Value Ref Range Status   Specimen Description BLOOD RIGHT WRIST   Final   Special Requests BOTTLES DRAWN AEROBIC AND ANAEROBIC 12CC   Final   Culture   Setup Time     Final   Value: 10/12/2013 19:53     Performed at [facility]   Culture     Final   Value: VIRIDANS STREPTOCOCCUS     Note: THE SIGNIFICANCE OF ISOLATING THIS ORGANISM FROM A SINGLE VENIPUNCTURE CANNOT BE PREDICTED WITHOUT FURTHER CLINICAL AND CULTURE CORRELATION. SUSCEPTIBILITIES AVAILABLE ONLY ON REQUEST.     Note: Gram Stain Report Called to,Read Back By and Verified With: WRIGHT M. AT 0830A ON [id number] BY THOMPSON S. Performed at [facility]  Performed at [facility]   Report Status 10/13/2013 FINAL   Final  BODY FLUID CULTURE     Status: None   Collection Time    10/11/13  4:50 PM      Result Value Ref Range Status   Specimen Description PERITONEAL FLUID   Final   Special Requests NONE   Final   Gram Stain     Final   Value: FEW WBC PRESENT,BOTH PMN AND MONONUCLEAR     NO ORGANISMS SEEN     Performed at [facility]   Culture     Final   Value: NO GROWTH 3 DAYS     Performed at [facility]   Report Status 10/15/2013 FINAL   Final  CULTURE, BLOOD (ROUTINE X 2)     Status: None   Collection Time    10/11/13  5:22 PM      Result Value Ref Range Status   Specimen Description BLOOD RIGHT WRIST   Final   Special Requests BOTTLES DRAWN AEROBIC AND ANAEROBIC 10CC   Final   Culture NO GROWTH 5 DAYS   Final   Report Status 10/16/2013 FINAL   Final     Labs: Basic Metabolic Panel:  Recent Labs Lab 10/12/13 0613 10/13/13 0607 10/14/13 0551 10/15/13 0544 10/17/13 0552  NA 133* 134* 139 137 140  K 3.8 3.8 3.5* 3.3* 3.4*  CL 99 98 100 96 96  CO2 24 24 27 29 31   GLUCOSE 243* 226* 195* 267* 170*  BUN 13 14 13 14 19   CREATININE 0.81 0.82 0.94 1.03 1.19*  CALCIUM 7.9* 8.2* 8.5 8.9 9.1   Liver Function Tests:  Recent Labs Lab 10/11/13 1620 10/13/13 0607 10/14/13 0551 10/15/13 0544 10/17/13 0552  AST 31 29 20 27  50*  ALT 34 48* 35 38* 54*  ALKPHOS 84 83 84 118* 124*  BILITOT 1.3* 1.1 1.1 1.1 1.2  PROT 6.5 6.3  6.4 6.7 6.9  ALBUMIN 3.2* 3.3* 3.8 3.8 3.8    Recent Labs Lab 10/11/13 1620  LIPASE 33    Recent Labs Lab 10/11/13 1620  AMMONIA 55   CBC:  Recent Labs Lab 10/11/13 1525 10/12/13 0613 10/12/13 1511 10/14/13 0551 10/15/13 0544 10/17/13 0552  WBC 7.0 5.2 5.2 3.2* 2.3* 2.9*  NEUTROABS 6.4 4.4 4.3 2.6 1.7  --   HGB 9.9* 8.3* 8.9* 8.2* 8.6* 9.0*  HCT 30.8* 26.0* 27.9* 25.4* 25.9* 28.1*  MCV 82.4 82.8 83.3 81.4 80.7 81.2  PLT 48* 36* 40* 37* 40* 45*   Cardiac Enzymes: No results found for this basename: CKTOTAL, CKMB, CKMBINDEX, TROPONINI,  in the last 168 hours BNP: BNP (last 3 results) No results found for this basename: PROBNP,  in the last 8760 hours CBG:  Recent Labs Lab 10/16/13 1655 10/16/13 2041 10/17/13 0735 10/17/13 1127 10/17/13 1615  GLUCAP 300* 152* 180* 349* 277*       Signed:  Ebon Ketchum  [facility] 10/17/2013, 8:13 PM

## 2013-10-17 NOTE — Progress Notes (Signed)
Pt given discharge instructions and diabetic teaching information.  Pt was given demonstration on how to administer injection SQ in abdomen, and how to perform accucheck.  Pt did return demonstration on accucheck and insulin injection correctly.  Scripts given to pt and pt taken via wheelchair to main entrance where boyfriend met her with his car. Pt watched all diabetic teaching videos and stated they were very informative.   All belongings were taken with pt also.

## 2013-10-17 NOTE — Discharge Instructions (Signed)
Peritonitis Peritonitis is inflammation (the body's way of reacting to injury and/or infection) of the peritoneum. The peritoneum is the tissue that lines the abdomen and covers the internal organs. CAUSES   Conditions or injuries cause organs to leak stool, bacteria, fungi or chemicals (such as bile or other digestive juices) into the abdomen. When these substances come into contact with the peritoneum, they may cause irritation or infection.  Many conditions can cause peritonitis, including:  Pancreatitis.  Diverticulitis.  Appendicitis.  Ulcers.  Crohn's disease or ulcerative colitis.  Other infections inside the abdomen or pelvis.  Abdominal injury.  Injury to the stomach or esophagus (food pipe).  Cancer.  Liver disease.  Peritoneal dialysis (a procedure used to cleanse the blood when the kidneys are unable to do so).  Tuberculosis. SYMPTOMS  Symptoms of peritonitis may include:  Severe pain.  Abdominal swelling.  Fever and chills.  Nausea and vomiting.  Poor or no appetite.  Inability to pass gas or stool. DIAGNOSIS  Diagnosis begins with a complete physical exam. The abdomen may be tender or hard and board-like. Testing may include:  Blood tests.  Urinalysis  Stool analysis (if there is associated diarrhea).  Paracentesis: a procedure where a very thin needle may be used to take a sample of fluid from within the abdomen. This fluid can be examined for signs of infection.  X-ray.  Ultrasound.  CT scans. TREATMENT  This must be treated quickly, to avoid a severe, life-threatening infection that involves the whole body (sepsis). This must be done in the hospital. Treatment for peritonitis may include:  Antibiotics.  Surgery to remove infected fluid and tissue.  Surgery to treat conditions that cause peritonitis (such as an appendectomy for appendicitis). HOME CARE INSTRUCTIONS  Even after successful treatment in the hospital, treatment and  recovery will continue at home. It is important to:  Take medicines (such as antibiotics) exactly as prescribed by your caregiver.  If you were taking prescription medicines for other problems before you developed peritonitis, ask about when you should re-start these medicines.  Only take over-the-counter or prescription medicines for pain, discomfort or fever as directed by your caregiver.  Follow your prescribed diet. You might need a higher fiber diet to help avoid constipation.  Drink extra water.  Use a stool softener or laxative, if recommended.  Get extra rest. [place] IF:  You have increased abdominal pain or tenderness, or abdominal swelling.  You develop an unexplained oral temperature above 102 F (38.9 C).  You start to have the chills.  You have new problems with passing urine.  You develop chest pains and/or shortness of breath.  You experience nausea, vomiting or diarrhea.  You are unable to pass gas or stool.  You have had surgery and notice the healing incision has become hot, red, swollen or is leaking pus or blood. Document Released: 01/07/2008 Document Revised: 04/18/2011 Document Reviewed: 01/07/2008 [facility] Patient Information 2015 [place], [place]. This information is not intended to replace advice given to you by your health care provider. Make sure you discuss any questions you have with your health care provider.

## 2013-10-17 NOTE — Progress Notes (Addendum)
Patient ID: [name], female   DOB: [birth year], 53 y.o.   MRN: [id number] Feels better. Having some nausea. Abdomen is distended but soft. Ambulating in room.  Meld score 13. Patient may be discharged. Filed Vitals:   10/16/13 0628 10/16/13 1505 10/16/13 2035 10/17/13 0522  BP: 99/52 105/60 118/51 103/47  Pulse: 54 60 65 56  Temp: 98.1 F (36.7 C) 98 F (36.7 C) 98.4 F (36.9 C) 98.9 F (37.2 C)  TempSrc: Oral Oral Oral Oral  Resp: 20 20 20 17   Height:      Weight:    148 lb 5.9 oz (67.3 kg)  SpO2: 96% 97% 100% 95%     GI attending note; As above patient is feeling better than she did yesterday. She is having much less nausea. She's also having less abdominal pain. Blood and ascitic fluid cultures remained negative. Patient advised to take promethazine on an as-needed basis only when nausea is intractable. Will use Levaquin 500 mg by mouth daily for SBP prophylaxis until further recommendations made by Dr. Monica Martinez, a sense of transplant hepatologist at [facility].

## 2013-10-17 NOTE — Progress Notes (Signed)
Taped up pt IV with glove and tape to allow pt to shower.  Will continue to monitor.

## 2013-10-17 NOTE — Progress Notes (Addendum)
Inpatient Diabetes Program Recommendations  AACE/ADA: New Consensus Statement on Inpatient Glycemic Control (2013)  Target Ranges:  Prepandial:   less than 140 mg/dL      Peak postprandial:   less than 180 mg/dL (1-2 hours)      Critically ill patients:  140 - 180 mg/dL   Results for KANDUS, POGANY (MRN [id number]) as of 10/17/2013 08:17  Ref. Range 10/16/2013 07:50 10/16/2013 11:38 10/16/2013 16:55 10/16/2013 20:41 10/17/2013 07:35  Glucose-Capillary Latest Range: 70-99 mg/dL 203 (H) 390 (H) 300 (H) 152 (H) 180 (H)   Diabetes history: DM2  Outpatient Diabetes medications: None  Current orders for Inpatient glycemic control: Lantus 15 units daily (to start today), Novolog 0-15 units AC, Novolog 0-5 units HS, Novolog 3 units AC with meals for meal coverage  Inpatient Diabetes Program Recommendations Insulin - Basal: Noted Lantus was increased to 15 units to start today. Patient only received Lantus 4 units on 9/9 and fasting glucose was 180 mg/dl this morning. May want to consider decreasing Lantus to 10 units daily. Insulin - Meal Coverage: Please consider increasing Novolog meal coverage to 6 units TID with meals.   Note: Spoke with patient and her husband about new diabetes diagnosis. Discussed A1C results (6.7% from 05/29/13; as no current A1C available) and explained what an A1C is, basic pathophysiology of DM Type 2, basic home care, importance of checking CBGs and maintaining good CBG control to prevent long-term and short-term complications. In talking with the patient she states that she had a steroid injection about a week ago for chiggers. Explained how steroids can impact diabetes control and the potential impact on an A1C value.  Reviewed Living Well with Diabetes booklet with the patient. Reviewed signs and symptoms of hyperglycemia and hypoglycemia along with treatment for both.  RNs to provide ongoing basic DM education at bedside with this patient and engage patient to actively check blood  glucose and administer insulin injections. Have ordered educational booklet, RD consult, and DM videos. Patient was not very engaged during our conversation and will need bedside nursing staff to reiterate diabetes education. Discharge plan for diabetes management not noted yet, so have asked bedside nursing to educate patient on insulin administration and allow her to give herself insulin injections so she will be comfortable with self injecting insulin in case she is discharged on insulin.  Discussed normal and abnormal glucose values and encouraged patient to check her glucose at least 2 times per day, before driving, and anytime she felt symptomatic from high or low blood glucose. Discussed impact of nutrition, exercise, stress, sickness, and medications on diabetes control. Discussed carbohydrates, carbohydrate goals per day and meal, along with portion sizes. Patient verbalized understanding of information discussed and states that she does not have any further questions related to diabetes at this time.  MD, Patient will need a prescription for a glucometer and testing supplies at time of discharge.  Thanks, Barnie Alderman, RN, MSN, CCRN Diabetes Coordinator Inpatient Diabetes Program [phone] (Team Pager) [phone] ([facility] office) [phone] [facility] [place] office)

## 2013-10-17 NOTE — Progress Notes (Signed)
Pt watching diabetic education videos.  501, 507, 509 not working.

## 2013-11-14 ENCOUNTER — Telehealth (INDEPENDENT_AMBULATORY_CARE_PROVIDER_SITE_OTHER): Payer: Self-pay | Admitting: *Deleted

## 2013-11-14 ENCOUNTER — Other Ambulatory Visit (INDEPENDENT_AMBULATORY_CARE_PROVIDER_SITE_OTHER): Payer: Self-pay | Admitting: Internal Medicine

## 2013-11-14 NOTE — Telephone Encounter (Signed)
Great news.

## 2013-11-14 NOTE — Telephone Encounter (Signed)
FYIAvital [name] wanted you to know that Dr Monica Martinez called her the other night to let her know she was 1 of 5 and she could get the call at any minute for a liver transplant.Marland Kitchen

## 2013-12-10 ENCOUNTER — Ambulatory Visit (INDEPENDENT_AMBULATORY_CARE_PROVIDER_SITE_OTHER): Payer: BC Managed Care – PPO | Admitting: Internal Medicine

## 2013-12-10 ENCOUNTER — Encounter (INDEPENDENT_AMBULATORY_CARE_PROVIDER_SITE_OTHER): Payer: Self-pay | Admitting: Internal Medicine

## 2013-12-10 VITALS — BP 102/66 | HR 70 | Temp 97.0°F | Resp 18 | Ht 66.0 in | Wt 153.1 lb

## 2013-12-10 DIAGNOSIS — K7682 Hepatic encephalopathy: Secondary | ICD-10-CM

## 2013-12-10 DIAGNOSIS — R188 Other ascites: Secondary | ICD-10-CM

## 2013-12-10 DIAGNOSIS — D638 Anemia in other chronic diseases classified elsewhere: Secondary | ICD-10-CM

## 2013-12-10 DIAGNOSIS — K7290 Hepatic failure, unspecified without coma: Secondary | ICD-10-CM

## 2013-12-10 DIAGNOSIS — Z8619 Personal history of other infectious and parasitic diseases: Secondary | ICD-10-CM

## 2013-12-10 DIAGNOSIS — K7460 Unspecified cirrhosis of liver: Secondary | ICD-10-CM

## 2013-12-10 NOTE — Progress Notes (Addendum)
Presenting complaint;  Follow-up for decompensated liver disease.  Subjective:  Patient is 53 year old Caucasian female who was decompensated liver disease. Her cirrhosis is secondary to hepatitis C which was successfully treated over 10 years ago. She received call from [facility] week before last and was advised that she was 4 on the list for transplant and may receive a call at any moment. She is excited and anxiously waiting. She continues to have intermittent right midabdominal pain. This pain is nothing like she had when she had SBP. Appetite is fair. Her weight is up by 5 pounds since her last visit. She feels weak and very tired and unable to perform usual household work.she is having at least 3-4 bowel movements per day. She denies melena or rectal bleeding nausea or vomiting.    Current Medications: Outpatient Encounter Prescriptions as of 12/10/2013  Medication Sig  . b complex vitamins tablet Take 1 tablet by mouth daily.  . baclofen (LIORESAL) 10 MG tablet Take 10 mg by mouth as needed.   . cyclobenzaprine (FLEXERIL) 10 MG tablet Take 10 mg by mouth 3 (three) times daily as needed for muscle spasms. As needed  . ferrous sulfate 325 (65 FE) MG tablet Take 325 mg by mouth 2 (two) times daily with a meal.  . furosemide (LASIX) 40 MG tablet Take 80 mg by mouth daily.   Marland Kitchen lactulose (CHRONULAC) 10 GM/15ML solution Take 45 mLs (30 g total) by mouth 3 (three) times daily.  Marland Kitchen levofloxacin (LEVAQUIN) 500 MG tablet Take 500 mg by mouth daily.  . midodrine (PROAMATINE) 5 MG tablet Take 1 tablet (5 mg total) by mouth 3 (three) times daily with meals.  . Multiple Vitamin (MULTIVITAMIN WITH MINERALS) TABS tablet Take 1 tablet by mouth daily.  Marland Kitchen omeprazole (PRILOSEC) 20 MG capsule TAKE (1) CAPSULE TWICE DAILY BEFORE MEALS.  . rifaximin (XIFAXAN) 550 MG TABS Take 550 mg by mouth 2 (two) times daily.  Marland Kitchen spironolactone (ALDACTONE) 100 MG tablet Take 300 mg by mouth daily.   . traZODone  (DESYREL) 50 MG tablet Take 1 tablet (50 mg total) by mouth at bedtime.  . [DISCONTINUED] hydrOXYzine (ATARAX/VISTARIL) 25 MG tablet Take 25 mg by mouth daily as needed. For itchinng  . [DISCONTINUED] insulin aspart (NOVOLOG) 100 UNIT/ML FlexPen Inject 7 Units into the skin 3 (three) times daily with meals.  . [DISCONTINUED] Insulin Glargine (LANTUS) 100 UNIT/ML Solostar Pen Inject 15 Units into the skin daily at 10 pm.  . [DISCONTINUED] levofloxacin (LEVAQUIN) 500 MG tablet Take 1 tablet (500 mg total) by mouth daily.  . [DISCONTINUED] oxyCODONE (ROXICODONE) 5 MG immediate release tablet Take 1 tablet (5 mg total) by mouth every 6 (six) hours as needed for severe pain.  . [DISCONTINUED] promethazine (PHENERGAN) 12.5 MG tablet Take 1 tablet (12.5 mg total) by mouth every 6 (six) hours as needed for nausea or vomiting.     Objective: Blood pressure 102/66, pulse 70, temperature 97 F (36.1 C), temperature source Oral, resp. rate 18, height 5\' 6"  (1.676 m), weight 153 lb 1.6 oz (69.446 kg). Patient is alert and in no acute distress. Asterixis absent. Conjunctiva is pink. Sclera is nonicteric Oropharyngeal mucosa is normal. No neck masses or thyromegaly noted. Cardiac exam with regular rhythm normal S1 and S2. No murmur or gallop noted. Lungs are clear to auscultation. Abdomen is full.she has a very large easily palpable spleen which is mildly tender but no rub noted. Liver edge is indistinct. She has mild tenderness in right  mid abdomen. Both flanks are bulging. No LE edema or clubbing noted. She has generalized muscle wasting.  Labs/studies Results: No lab studies done since September admission  Assessment:  #1. Advanced cirrhosis secondary to successfully treated hepatitis C over 10 years ago complicated by ascites and hepatic encephalopathy. She has been treated for SBP twice this year despite being on SBP prophylaxis. She is now on Levaquin. She also has undergone esophageal variceal  banding when she was going to [facility]. She is waiting for liver transplant which possibly would happen this month. #2. Extreme fatigue secondary to chronic liver disease and medications. #3. Anemia of chronic disease.   Plan:  Patient will go to the lab for CBC and comprehensive chemistry panel. Unless she undergoes liver transplant will see her back in 3 months. Patient will call if abdomen becomes tight and she needs LVAP.

## 2013-12-10 NOTE — Patient Instructions (Signed)
Physician will call with results of blood tests when completed. 

## 2013-12-12 LAB — COMPREHENSIVE METABOLIC PANEL WITH GFR
ALT: 18 U/L (ref 0–35)
AST: 23 U/L (ref 0–37)
Albumin: 3.6 g/dL (ref 3.5–5.2)
Alkaline Phosphatase: 83 U/L (ref 39–117)
BUN: 9 mg/dL (ref 6–23)
CO2: 30 meq/L (ref 19–32)
Calcium: 8.2 mg/dL — ABNORMAL LOW (ref 8.4–10.5)
Chloride: 98 meq/L (ref 96–112)
Creat: 0.98 mg/dL (ref 0.50–1.10)
Glucose, Bld: 274 mg/dL — ABNORMAL HIGH (ref 70–99)
Potassium: 3.2 meq/L — ABNORMAL LOW (ref 3.5–5.3)
Sodium: 136 meq/L (ref 135–145)
Total Bilirubin: 1 mg/dL (ref 0.2–1.2)
Total Protein: 6.3 g/dL (ref 6.0–8.3)

## 2013-12-12 LAB — CBC
HEMATOCRIT: 29.8 % — AB (ref 36.0–46.0)
Hemoglobin: 9.7 g/dL — ABNORMAL LOW (ref 12.0–15.0)
MCH: 25.1 pg — ABNORMAL LOW (ref 26.0–34.0)
MCHC: 32.6 g/dL (ref 30.0–36.0)
MCV: 77.2 fL — ABNORMAL LOW (ref 78.0–100.0)
PLATELETS: 51 10*3/uL — AB (ref 150–400)
RBC: 3.86 MIL/uL — ABNORMAL LOW (ref 3.87–5.11)
RDW: 16.4 % — ABNORMAL HIGH (ref 11.5–15.5)
WBC: 3 10*3/uL — AB (ref 4.0–10.5)

## 2013-12-14 ENCOUNTER — Other Ambulatory Visit (INDEPENDENT_AMBULATORY_CARE_PROVIDER_SITE_OTHER): Payer: Self-pay | Admitting: Internal Medicine

## 2013-12-17 ENCOUNTER — Telehealth (INDEPENDENT_AMBULATORY_CARE_PROVIDER_SITE_OTHER): Payer: Self-pay | Admitting: Internal Medicine

## 2013-12-17 DIAGNOSIS — B1920 Unspecified viral hepatitis C without hepatic coma: Secondary | ICD-10-CM

## 2013-12-17 MED ORDER — LEVOFLOXACIN 500 MG PO TABS: 500.0000 mg | ORAL_TABLET | Freq: Every day | ORAL | Status: DC

## 2013-12-17 NOTE — Telephone Encounter (Signed)
Rx for Levofloxacin

## 2013-12-18 ENCOUNTER — Telehealth (INDEPENDENT_AMBULATORY_CARE_PROVIDER_SITE_OTHER): Payer: Self-pay | Admitting: *Deleted

## 2013-12-18 DIAGNOSIS — K7031 Alcoholic cirrhosis of liver with ascites: Secondary | ICD-10-CM

## 2013-12-18 DIAGNOSIS — R739 Hyperglycemia, unspecified: Secondary | ICD-10-CM

## 2013-12-18 NOTE — Telephone Encounter (Signed)
Per Dr.Rehman the patient will need to have labs drawn in 1 week from 12/13/13. Labs are noted for 12/20/13.

## 2013-12-21 LAB — BASIC METABOLIC PANEL
BUN: 11 mg/dL (ref 6–23)
CHLORIDE: 101 meq/L (ref 96–112)
CO2: 26 mEq/L (ref 19–32)
Calcium: 8.4 mg/dL (ref 8.4–10.5)
Creat: 0.83 mg/dL (ref 0.50–1.10)
Glucose, Bld: 142 mg/dL — ABNORMAL HIGH (ref 70–99)
POTASSIUM: 3.8 meq/L (ref 3.5–5.3)
SODIUM: 136 meq/L (ref 135–145)

## 2013-12-21 LAB — MAGNESIUM: Magnesium: 1.8 mg/dL (ref 1.5–2.5)

## 2013-12-24 ENCOUNTER — Telehealth (INDEPENDENT_AMBULATORY_CARE_PROVIDER_SITE_OTHER): Payer: Self-pay | Admitting: *Deleted

## 2013-12-24 DIAGNOSIS — K7031 Alcoholic cirrhosis of liver with ascites: Secondary | ICD-10-CM

## 2013-12-24 DIAGNOSIS — R739 Hyperglycemia, unspecified: Secondary | ICD-10-CM

## 2013-12-24 NOTE — Telephone Encounter (Signed)
Per Dr.Rehman the patient will need to have labs drawn in 1 month. 

## 2013-12-25 ENCOUNTER — Other Ambulatory Visit (INDEPENDENT_AMBULATORY_CARE_PROVIDER_SITE_OTHER): Payer: Self-pay | Admitting: *Deleted

## 2013-12-25 ENCOUNTER — Encounter (INDEPENDENT_AMBULATORY_CARE_PROVIDER_SITE_OTHER): Payer: Self-pay | Admitting: *Deleted

## 2013-12-25 DIAGNOSIS — R739 Hyperglycemia, unspecified: Secondary | ICD-10-CM

## 2013-12-25 DIAGNOSIS — K7031 Alcoholic cirrhosis of liver with ascites: Secondary | ICD-10-CM

## 2014-01-22 ENCOUNTER — Other Ambulatory Visit (INDEPENDENT_AMBULATORY_CARE_PROVIDER_SITE_OTHER): Payer: Self-pay | Admitting: Internal Medicine

## 2014-01-23 ENCOUNTER — Encounter (INDEPENDENT_AMBULATORY_CARE_PROVIDER_SITE_OTHER): Payer: Self-pay | Admitting: *Deleted

## 2014-01-24 LAB — BASIC METABOLIC PANEL
BUN: 11 mg/dL (ref 6–23)
CALCIUM: 8.6 mg/dL (ref 8.4–10.5)
CHLORIDE: 99 meq/L (ref 96–112)
CO2: 26 meq/L (ref 19–32)
Creat: 1.03 mg/dL (ref 0.50–1.10)
GLUCOSE: 211 mg/dL — AB (ref 70–99)
POTASSIUM: 3.3 meq/L — AB (ref 3.5–5.3)
Sodium: 134 mEq/L — ABNORMAL LOW (ref 135–145)

## 2014-01-29 ENCOUNTER — Encounter (INDEPENDENT_AMBULATORY_CARE_PROVIDER_SITE_OTHER): Payer: Self-pay

## 2014-02-03 ENCOUNTER — Telehealth (INDEPENDENT_AMBULATORY_CARE_PROVIDER_SITE_OTHER): Payer: Self-pay | Admitting: *Deleted

## 2014-02-03 DIAGNOSIS — K7682 Hepatic encephalopathy: Secondary | ICD-10-CM

## 2014-02-03 DIAGNOSIS — D696 Thrombocytopenia, unspecified: Secondary | ICD-10-CM

## 2014-02-03 DIAGNOSIS — K7290 Hepatic failure, unspecified without coma: Secondary | ICD-10-CM

## 2014-02-03 DIAGNOSIS — K7031 Alcoholic cirrhosis of liver with ascites: Secondary | ICD-10-CM

## 2014-02-03 NOTE — Telephone Encounter (Signed)
Per Dr.Rehman the patient will need to have labs drawn in 1 month. 

## 2014-02-04 ENCOUNTER — Other Ambulatory Visit (INDEPENDENT_AMBULATORY_CARE_PROVIDER_SITE_OTHER): Payer: Self-pay | Admitting: *Deleted

## 2014-02-04 ENCOUNTER — Encounter (INDEPENDENT_AMBULATORY_CARE_PROVIDER_SITE_OTHER): Payer: Self-pay | Admitting: *Deleted

## 2014-02-04 DIAGNOSIS — K7682 Hepatic encephalopathy: Secondary | ICD-10-CM

## 2014-02-04 DIAGNOSIS — K7031 Alcoholic cirrhosis of liver with ascites: Secondary | ICD-10-CM

## 2014-02-04 DIAGNOSIS — D696 Thrombocytopenia, unspecified: Secondary | ICD-10-CM

## 2014-02-04 DIAGNOSIS — K7290 Hepatic failure, unspecified without coma: Secondary | ICD-10-CM

## 2014-02-11 ENCOUNTER — Telehealth (INDEPENDENT_AMBULATORY_CARE_PROVIDER_SITE_OTHER): Payer: Self-pay | Admitting: *Deleted

## 2014-02-11 NOTE — Telephone Encounter (Signed)
[name] called and said Dr. Eden Emms would like for her to get an Endoscopy done ASAP. If there are any questions, Dr. Laural Golden may call him personally. If it is going to be a while, she can have it done at [facility].

## 2014-02-11 NOTE — Telephone Encounter (Signed)
Forwarded to Ann to address 

## 2014-02-12 ENCOUNTER — Other Ambulatory Visit (INDEPENDENT_AMBULATORY_CARE_PROVIDER_SITE_OTHER): Payer: Self-pay | Admitting: *Deleted

## 2014-02-12 DIAGNOSIS — K7469 Other cirrhosis of liver: Secondary | ICD-10-CM

## 2014-02-12 NOTE — Telephone Encounter (Signed)
EGD sch'd 02/19/14, patient aware

## 2014-02-19 ENCOUNTER — Encounter (HOSPITAL_COMMUNITY): Payer: Self-pay | Admitting: *Deleted

## 2014-02-19 ENCOUNTER — Ambulatory Visit (HOSPITAL_COMMUNITY)
Admission: RE | Admit: 2014-02-19 | Discharge: 2014-02-19 | Disposition: A | Discharge status: Home / Self Care | Payer: 59 | Source: Ambulatory Visit | Attending: Internal Medicine | Admitting: Internal Medicine

## 2014-02-19 ENCOUNTER — Encounter (HOSPITAL_COMMUNITY)
Admission: RE | Disposition: A | Discharge status: Home / Self Care | Payer: Self-pay | Source: Ambulatory Visit | Attending: Internal Medicine

## 2014-02-19 DIAGNOSIS — K7460 Unspecified cirrhosis of liver: Secondary | ICD-10-CM | POA: Diagnosis present

## 2014-02-19 DIAGNOSIS — E119 Type 2 diabetes mellitus without complications: Secondary | ICD-10-CM | POA: Insufficient documentation

## 2014-02-19 DIAGNOSIS — Z833 Family history of diabetes mellitus: Secondary | ICD-10-CM | POA: Diagnosis not present

## 2014-02-19 DIAGNOSIS — Z9884 Bariatric surgery status: Secondary | ICD-10-CM

## 2014-02-19 DIAGNOSIS — Z87442 Personal history of urinary calculi: Secondary | ICD-10-CM | POA: Diagnosis not present

## 2014-02-19 DIAGNOSIS — K319 Disease of stomach and duodenum, unspecified: Secondary | ICD-10-CM | POA: Diagnosis not present

## 2014-02-19 DIAGNOSIS — K314 Gastric diverticulum: Secondary | ICD-10-CM | POA: Insufficient documentation

## 2014-02-19 DIAGNOSIS — I8500 Esophageal varices without bleeding: Secondary | ICD-10-CM | POA: Diagnosis not present

## 2014-02-19 DIAGNOSIS — B182 Chronic viral hepatitis C: Secondary | ICD-10-CM | POA: Insufficient documentation

## 2014-02-19 DIAGNOSIS — Z79899 Other long term (current) drug therapy: Secondary | ICD-10-CM | POA: Diagnosis not present

## 2014-02-19 DIAGNOSIS — K3189 Other diseases of stomach and duodenum: Secondary | ICD-10-CM | POA: Diagnosis not present

## 2014-02-19 DIAGNOSIS — K7469 Other cirrhosis of liver: Secondary | ICD-10-CM

## 2014-02-19 DIAGNOSIS — F329 Major depressive disorder, single episode, unspecified: Secondary | ICD-10-CM | POA: Diagnosis not present

## 2014-02-19 HISTORY — PX: ESOPHAGOGASTRODUODENOSCOPY: SHX5428

## 2014-02-19 SURGERY — EGD (ESOPHAGOGASTRODUODENOSCOPY)
Anesthesia: Moderate Sedation

## 2014-02-19 MED ORDER — MIDAZOLAM HCL 5 MG/5ML IJ SOLN
INTRAMUSCULAR | Status: AC
  Filled 2014-02-19: qty 10

## 2014-02-19 MED ORDER — MEPERIDINE HCL 50 MG/ML IJ SOLN
INTRAMUSCULAR | Status: AC
  Filled 2014-02-19: qty 1

## 2014-02-19 MED ORDER — STERILE WATER FOR IRRIGATION IR SOLN
Status: DC | PRN
  Administered 2014-02-19: 12:00:00

## 2014-02-19 MED ORDER — BUTAMBEN-TETRACAINE-BENZOCAINE 2-2-14 % EX AERO
INHALATION_SPRAY | CUTANEOUS | Status: DC | PRN
  Administered 2014-02-19: 2 via TOPICAL

## 2014-02-19 MED ORDER — MIDAZOLAM HCL 5 MG/5ML IJ SOLN
INTRAMUSCULAR | Status: DC | PRN
  Administered 2014-02-19 (×5): 2 mg via INTRAVENOUS

## 2014-02-19 MED ORDER — SODIUM CHLORIDE 0.9 % IV SOLN
INTRAVENOUS | Status: DC
  Administered 2014-02-19: 12:00:00 via INTRAVENOUS

## 2014-02-19 MED ORDER — MEPERIDINE HCL 50 MG/ML IJ SOLN
INTRAMUSCULAR | Status: DC | PRN
  Administered 2014-02-19 (×2): 25 mg via INTRAVENOUS

## 2014-02-19 NOTE — Op Note (Signed)
EGD PROCEDURE REPORT  PATIENT:  [name]  MR#:  [id number] Birthdate:  [birth year], 54 y.o., female Endoscopist:  Dr. Rogene Houston, MD  Procedure Date: 02/19/2014  Procedure:   EGD  Indications: Patient is 54 year old Caucasian female with advanced cirrhosis was listed for liver transplant. She has history of esophageal varices and has undergone banding for primary prophylaxis. Last exam was over a year ago and she is due for one now.            Informed Consent:  The risks, benefits, alternatives & imponderables which include, but are not limited to, bleeding, infection, perforation, drug reaction and potential missed lesion have been reviewed.  The potential for biopsy, lesion removal, esophageal dilation, etc. have also been discussed.  Questions have been answered.  All parties agreeable.  Please see history & physical in medical record for more information.  Medications:  Demerol 50 mg IV Versed 10 mg IV Cetacaine spray topically for oropharyngeal anesthesia  Description of procedure:  The endoscope was introduced through the mouth and advanced to the second portion of the duodenum without difficulty or limitations. The mucosal surfaces were surveyed very carefully during advancement of the scope and upon withdrawal.  Findings:  Esophagus:  Mucosa of the proximal segment was normal. 2 columns grade 1-2 noted at midesophagus. Multiple scars noted at distal esophagus with short but very small varices.  GEJ:  36 cm Stomach:  Small gastric pouch allowing easy retroflexion. Mucosa of gastric pouch revealed focal erythema and mosaic pattern or snake skinning. Mucosa at gastrojejunostomy revealed edema and erythema but no ulceration. It was wide open. Jejunum:  Jejunal mucosa was examined for 40 cm and revealed edema but no erosions or ulceration.  Therapeutic/Diagnostic Maneuvers Performed:  None  Complications:  None  Impression: Two columns of grade 1-II esophageal varices at mid  esophagus not large enough to be banded. Focal scarring noted at distal esophagus from previous banding. Small gastric pouch with patent gastrojejunostomy. Portal gastropathy and jejunotomy.  Recommendations:  Standard instructions given.  Patient will call if ascites becomes tense in which case she will need LVAP. Follow-up with Dr. Monica Martinez as planned.  REHMAN,NAJEEB U  02/19/2014  12:49 PM  CC  Dr. Enid Derry,  MD CC: Dr. Glo Herring., MD & Dr. Rayne Du ref. provider found

## 2014-02-19 NOTE — H&P (Addendum)
[name] is an 54 y.o. female.   Chief Complaint: Patient's here for EGD and possible esophageal variceal banding. HPI: Patient is 54 year old Caucasian female who has cirrhosis secondary to chronic hepatitis C which was successfully treated over 10 years ago. Patient has decompensated liver disease and is on waiting list for liver transplant. She has undergone esophageal variceal banding for primary prophylaxis at [facility]. Last EGD was over a year ago. Her transplant hepatologist Dr. Sandi Mariscal she recommended she undergo EGD with variceal banding if varices have recovered.  Past Medical History  Diagnosis Date  . Esophageal varices   . Cirrhosis     non alcoholic  . Ascites   . Anemia   . Liver failure     "I'm on liver transplant list @ [facility]" (11/07/2012)  . Portal vein thrombosis   . Complication of anesthesia   . PONV (postoperative nausea and vomiting)   . Heart murmur   . Type II diabetes mellitus     "not since gastric bypass" (11/07/2012)  . Hepatitis C     Hx: of Hep "C" it was eradicated  . [id number]     "monthly" (11/07/2012)  . Anxiety   . Depression   . Kidney stones     Past Surgical History  Procedure Laterality Date  . Abdominal hysterectomy  2003  . Upper gastrointestinal endoscopy    . Colonoscopy    . Gastric bypass  ~ 1995  . Cesarean section  1995  . Lithotripsy      "several times" (11/07/2012)  . Knee arthroscopy Right   . Colonoscopy  10/21/2011    Procedure: COLONOSCOPY;  Surgeon: Rogene Houston, MD;  Location: [facility];  Service: Endoscopy;  Laterality: N/A;  245   . Cystoscopy with stent placement  12/08/2011    Procedure: CYSTOSCOPY WITH STENT PLACEMENT;  Surgeon: Marissa Nestle, MD;  Location: [facility] ORS;  Service: Urology;  Laterality: Right;  . Cystoscopy w/ retrogrades  12/08/2011    Procedure: CYSTOSCOPY WITH RETROGRADE PYELOGRAM;  Surgeon: Marissa Nestle, MD;  Location: [facility] ORS;  Service: Urology;  Laterality: Right;  . Dilation and  curettage of uterus    . Orif distal radius fracture Right 11/07/2012  . Cholecystectomy  1987  . Hernia repair  04/14/11    "one in my bellybutton" (11/07/2012)  . Inguinal hernia repair Right     "maybe 2" (11/07/2012)  . Open reduction internal fixation (orif) distal radial fracture Right 11/07/2012    Procedure: OPEN REDUCTION INTERNAL FIXATION (ORIF) RIGHT DISTAL RADIAL FRACTURE;  Surgeon: [name], MD;  Location: [place];  Service: Orthopedics;  Laterality: Right;    Family History  Problem Relation Age of Onset  . Dementia Mother   . Heart disease Father   . Liver disease Father   . Hypertension Father   . Diabetes Father   . Dementia Father   . Hypertension Brother   . Other Son     Cervical dystonia   Social History:  reports that she quit smoking about 22 years ago. Her smoking use included Cigarettes. She has a 2.5 pack-year smoking history. She has never used smokeless tobacco. She reports that she does not drink alcohol or use illicit drugs.  Allergies:  Allergies  Allergen Reactions  . Ancef [Cefazolin Sodium] Other (See Comments)    "Blisters in mouth and vagina" Tolerates Penicillins    Medications Prior to Admission  Medication Sig Dispense Refill  . b complex vitamins tablet  Take 1 tablet by mouth daily.    . baclofen (LIORESAL) 10 MG tablet Take 10 mg by mouth 3 (three) times daily as needed for muscle spasms (alternates with baclofen).     . cyclobenzaprine (FLEXERIL) 10 MG tablet Take 10 mg by mouth 3 (three) times daily as needed for muscle spasms. As needed    . ferrous sulfate 325 (65 FE) MG tablet Take 325 mg by mouth 2 (two) times daily with a meal.    . furosemide (LASIX) 40 MG tablet Take 80 mg by mouth daily.     Marland Kitchen lactulose (CHRONULAC) 10 GM/15ML solution Take 45 mLs (30 g total) by mouth 3 (three) times daily. (Patient taking differently: Take 30 g by mouth 2 (two) times daily as needed for mild constipation. ) 240 mL 3  . levofloxacin  (LEVAQUIN) 500 MG tablet TAKE (1) TABLET BY MOUTH ONCE DAILY. 30 tablet 2  . Multiple Vitamin (MULTIVITAMIN WITH MINERALS) TABS tablet Take 1 tablet by mouth daily.    . nadolol (CORGARD) 20 MG tablet Take 10 mg by mouth daily.    Marland Kitchen omeprazole (PRILOSEC) 20 MG capsule TAKE (1) CAPSULE TWICE DAILY BEFORE MEALS. 60 capsule 6  . rifaximin (XIFAXAN) 550 MG TABS Take 550 mg by mouth 2 (two) times daily.    Marland Kitchen spironolactone (ALDACTONE) 100 MG tablet Take 300 mg by mouth daily.     . traZODone (DESYREL) 50 MG tablet Take 1 tablet (50 mg total) by mouth at bedtime. 30 tablet 5  . midodrine (PROAMATINE) 5 MG tablet Take 1 tablet (5 mg total) by mouth 3 (three) times daily with meals. (Patient not taking: Reported on 02/14/2014) 90 tablet 1    No results found for this or any previous visit (from the past 48 hour(s)). No results found.  ROS  Blood pressure 109/65, pulse 76, temperature 98 F (36.7 C), temperature source Oral, resp. rate 18, SpO2 96 %. Physical Exam  Constitutional:  Well-developed and Caucasian female in NAD  HENT:  Mouth/Throat: Oropharynx is clear and moist.  Eyes: Conjunctivae are normal. No scleral icterus.  Neck: No thyromegaly present.  Cardiovascular: Normal rate, regular rhythm and normal heart sounds.   Murmur: faint systolic ejection murmur best heard at upper sternal border. GI:  Distended but not tense abdomen. Liver edge palpable below RCM its form. Easily palpable spleen. While he tender splenic edge(chronic finding). Bulging flanks with dullness(ascites).  Musculoskeletal: She exhibits no edema.  Lymphadenopathy:    She has no cervical adenopathy.  Neurological: She is alert.  Asterixis absent  Skin: Skin is warm and dry.     Assessment/Plan Decompensated cirrhosis. History of esophageal varices. EGD with possible variceal banding.  Semaja Lymon U 02/19/2014, 12:21 PM

## 2014-02-19 NOTE — Discharge Instructions (Signed)
Resume usual medications and diet. No driving for 24 hours.   Esophagogastroduodenoscopy Care After Refer to this sheet in the next few weeks. These instructions provide you with information on caring for yourself after your procedure. Your caregiver may also give you more specific instructions. Your treatment has been planned according to current medical practices, but problems sometimes occur. Call your caregiver if you have any problems or questions after your procedure.  HOME CARE INSTRUCTIONS  Do not eat or drink anything until the numbing medicine (local anesthetic) has worn off and your gag reflex has returned. You will know that the local anesthetic has worn off when you can swallow comfortably.  Do not drive for 12 hours after the procedure or as directed by your caregiver.  Only take medicines as directed by your caregiver. SEEK MEDICAL CARE IF:   You cannot stop coughing.  You are not urinating at all or less than usual. SEEK IMMEDIATE MEDICAL CARE IF:  You have difficulty swallowing.  You cannot eat or drink.  You have worsening throat or chest pain.  You have dizziness, lightheadedness, or you faint.  You have nausea or vomiting.  You have chills.  You have a fever.  You have severe abdominal pain.  You have black, tarry, or bloody stools. Document Released: 01/11/2012 Document Reviewed: 01/11/2012 [facility] Patient Information 2015 [place]. This information is not intended to replace advice given to you by your health care provider. Make sure you discuss any questions you have with your health care provider.

## 2014-02-20 ENCOUNTER — Encounter (HOSPITAL_COMMUNITY): Payer: Self-pay | Admitting: Orthopedic Surgery

## 2014-02-24 ENCOUNTER — Encounter (HOSPITAL_COMMUNITY): Payer: Self-pay | Admitting: Internal Medicine

## 2014-03-11 ENCOUNTER — Ambulatory Visit (INDEPENDENT_AMBULATORY_CARE_PROVIDER_SITE_OTHER): Payer: 59 | Admitting: Internal Medicine

## 2014-03-11 ENCOUNTER — Encounter (INDEPENDENT_AMBULATORY_CARE_PROVIDER_SITE_OTHER): Payer: Self-pay | Admitting: Internal Medicine

## 2014-03-11 VITALS — BP 118/76 | HR 70 | Temp 97.4°F | Resp 18 | Ht 66.0 in | Wt 156.8 lb

## 2014-03-11 DIAGNOSIS — K7682 Hepatic encephalopathy: Secondary | ICD-10-CM

## 2014-03-11 DIAGNOSIS — K7290 Hepatic failure, unspecified without coma: Secondary | ICD-10-CM

## 2014-03-11 DIAGNOSIS — K7469 Other cirrhosis of liver: Secondary | ICD-10-CM

## 2014-03-11 DIAGNOSIS — R252 Cramp and spasm: Secondary | ICD-10-CM

## 2014-03-11 DIAGNOSIS — R188 Other ascites: Secondary | ICD-10-CM

## 2014-03-11 MED ORDER — BACLOFEN 10 MG PO TABS: 10.0000 mg | ORAL_TABLET | Freq: Three times a day (TID) | ORAL | Status: DC | PRN

## 2014-03-11 NOTE — Patient Instructions (Signed)
Call if ascites becomes tense.

## 2014-03-11 NOTE — Progress Notes (Signed)
Presenting complaint;  Follow-up for decompensated liver disease.  Subjective:  Patient is 54 year old Caucasian female with advanced cirrhosis secondary to chronic hepatitis C which was successfully treated 12 years ago. She returns for scheduled visit. She was last seen 3 months ago. She did undergo EGD over 2 weeks ago as requested by her transplant hepatologist Dr. Monica Martinez. She had small varices which were not banded. Patient keeps hoping that she will get a call from [facility] to report immediately for liver transplant. She states she stays home close to her phone. She feels her abdomen is slowly becoming more and more distended. She continues to complain of dull pain in left upper quadrant. She denies nausea vomiting fever or chills. She has 3-4 stools per day. Stools are soft and loose. She denies melena or rectal bleeding. She denies recent confusional episode. She says she is going to [facility] next week where she'll be admitted for 2 weeks undergo studies to keep records updated for liver transplant.   Current Medications: Outpatient Encounter Prescriptions as of 03/11/2014  Medication Sig  . b complex vitamins tablet Take 1 tablet by mouth daily.  . baclofen (LIORESAL) 10 MG tablet Take 10 mg by mouth 3 (three) times daily as needed for muscle spasms (alternates with baclofen).   . cyclobenzaprine (FLEXERIL) 10 MG tablet Take 10 mg by mouth 3 (three) times daily as needed for muscle spasms. As needed  . ferrous sulfate 325 (65 FE) MG tablet Take 325 mg by mouth 2 (two) times daily with a meal.  . furosemide (LASIX) 40 MG tablet Take 80 mg by mouth daily.   Marland Kitchen lactulose (CHRONULAC) 10 GM/15ML solution Take 45 mLs (30 g total) by mouth 3 (three) times daily. (Patient taking differently: Take 30 g by mouth 2 (two) times daily as needed for mild constipation. )  . levofloxacin (LEVAQUIN) 500 MG tablet TAKE (1) TABLET BY MOUTH ONCE DAILY.  . Multiple Vitamin (MULTIVITAMIN WITH  MINERALS) TABS tablet Take 1 tablet by mouth daily.  . nadolol (CORGARD) 20 MG tablet Take 10 mg by mouth daily.  Marland Kitchen omeprazole (PRILOSEC) 20 MG capsule TAKE (1) CAPSULE TWICE DAILY BEFORE MEALS.  . rifaximin (XIFAXAN) 550 MG TABS Take 550 mg by mouth 2 (two) times daily.  Marland Kitchen spironolactone (ALDACTONE) 100 MG tablet Take 300 mg by mouth daily.   . traZODone (DESYREL) 50 MG tablet Take 1 tablet (50 mg total) by mouth at bedtime.  . [DISCONTINUED] midodrine (PROAMATINE) 5 MG tablet Take 1 tablet (5 mg total) by mouth 3 (three) times daily with meals. (Patient not taking: Reported on 02/14/2014)     Objective: Blood pressure 118/76, pulse 70, temperature 97.4 F (36.3 C), temperature source Oral, resp. rate 18, height 5\' 6"  (1.676 m), weight 156 lb 12.8 oz (71.124 kg). Patient is alert and in no acute distress. He appears chronically ill. She does not have asterixis. Conjunctiva is pink. Sclera is nonicteric Oropharyngeal mucosa is normal. No neck masses or thyromegaly noted. Cardiac exam with regular rhythm normal S1 and S2. No murmur or gallop noted. Lungs are clear to auscultation. Abdomen is distended but not tense. Shifting dullness is present. Large spleen which is easily palpable and mildly tender. No rub noted. Extremities are thin with evidence of muscle wasting but no peripheral edema.    Assessment:  #1. Cirrhotic ascites. Fluid is slowly reaccumulating and she will call for an abdomen is tense so that we can schedule LVAP. Last CT was in  September 2015. She will have imaging studies next week at [facility] #2  Hepatic encephalopathy. Clinically she does not appear to be encephalopathic. #3. History of hepatitis C treated over 11 years ago with [facility].  Plan:  Patient will call when she needs LV AP. Office visit in 2 months.

## 2014-05-13 ENCOUNTER — Ambulatory Visit (INDEPENDENT_AMBULATORY_CARE_PROVIDER_SITE_OTHER): Payer: Medicare Other | Admitting: Internal Medicine

## 2014-05-13 ENCOUNTER — Encounter (INDEPENDENT_AMBULATORY_CARE_PROVIDER_SITE_OTHER): Payer: Self-pay | Admitting: Internal Medicine

## 2014-05-13 VITALS — BP 116/68 | HR 68 | Temp 97.9°F | Resp 18 | Ht 66.0 in | Wt 153.0 lb

## 2014-05-13 DIAGNOSIS — K7682 Hepatic encephalopathy: Secondary | ICD-10-CM

## 2014-05-13 DIAGNOSIS — G4700 Insomnia, unspecified: Secondary | ICD-10-CM | POA: Diagnosis not present

## 2014-05-13 DIAGNOSIS — M6248 Contracture of muscle, other site: Secondary | ICD-10-CM | POA: Diagnosis not present

## 2014-05-13 DIAGNOSIS — K7290 Hepatic failure, unspecified without coma: Secondary | ICD-10-CM | POA: Diagnosis not present

## 2014-05-13 DIAGNOSIS — M62838 Other muscle spasm: Secondary | ICD-10-CM

## 2014-05-13 DIAGNOSIS — M25511 Pain in right shoulder: Secondary | ICD-10-CM | POA: Diagnosis not present

## 2014-05-13 DIAGNOSIS — I81 Portal vein thrombosis: Secondary | ICD-10-CM | POA: Insufficient documentation

## 2014-05-13 MED ORDER — RIFAXIMIN 550 MG PO TABS: 550.0000 mg | ORAL_TABLET | Freq: Two times a day (BID) | ORAL | Status: DC

## 2014-05-13 MED ORDER — BACLOFEN 10 MG PO TABS: 10.0000 mg | ORAL_TABLET | Freq: Three times a day (TID) | ORAL | Status: DC | PRN

## 2014-05-13 NOTE — Progress Notes (Signed)
Presenting complaint;  Follow-up for chronic liver disease.  Subjective:  Patient is 54 year old Caucasian female who was advanced cirrhosis secondary to remote hepatitis C which was successfully treated 12 years ago. She was last seen two months ago. She does not feel well. She states she has no quality to her life. She is unable to do much other than minimal housework. She complains of extreme weakness. She is unable sleep at night despite taking trazodone. She complains of pain in right shoulder and scapular area. She does not have cough or shortness of breath. She is not sure if physical activity makes his pain worse. She does not remember having fallen recently. She was seen by Dr. Monica Martinez but 2 weeks ago. She is now listed at 5 centers. 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 She denies confusion, pain fever melena or rectal bleeding. She needs new prescription for cephalexin and baclofen.   Current Medications: Outpatient Encounter Prescriptions as of 05/13/2014  Medication Sig  . b complex vitamins tablet Take 1 tablet by mouth daily.  . baclofen (LIORESAL) 10 MG tablet Take 1 tablet (10 mg total) by mouth 3 (three) times daily as needed for muscle spasms (alternates with baclofen).  . cyclobenzaprine (FLEXERIL) 10 MG tablet Take 10 mg by mouth 3 (three) times daily as needed for muscle spasms. As needed  . ferrous sulfate 325 (65 FE) MG tablet Take 325 mg by mouth 2 (two) times daily with a meal.  . furosemide (LASIX) 40 MG tablet Take 100 mg by mouth daily.   Marland Kitchen lactulose (CHRONULAC) 10 GM/15ML solution Take 45 mLs (30 g total) by mouth 3 (three) times daily. (Patient taking differently: Take 30 g by mouth 2 (two) times daily as needed for mild constipation. )  . levofloxacin (LEVAQUIN) 500 MG tablet TAKE (1) TABLET BY MOUTH ONCE DAILY.  . Multiple Vitamin (MULTIVITAMIN WITH MINERALS) TABS tablet Take 1 tablet by mouth daily.  . nadolol (CORGARD) 20 MG tablet Take 10 mg by mouth  daily.  Marland Kitchen omeprazole (PRILOSEC) 20 MG capsule TAKE (1) CAPSULE TWICE DAILY BEFORE MEALS.  . rifaximin (XIFAXAN) 550 MG TABS Take 550 mg by mouth 2 (two) times daily.  Marland Kitchen spironolactone (ALDACTONE) 100 MG tablet Take 300 mg by mouth 3 (three) times daily.   . traZODone (DESYREL) 50 MG tablet Take 1 tablet (50 mg total) by mouth at bedtime.  Marland Kitchen warfarin (COUMADIN) 3 MG tablet Take 3 mg by mouth daily.      Objective: Blood pressure 116/68, pulse 68, temperature 97.9 F (36.6 C), temperature source Oral, resp. rate 18, height 5\' 6"  (1.676 m), weight 153 lb (69.4 kg). Patient is alert and in no acute distress. She does not have asterixis. Conjunctiva is pink. Sclera is nonicteric Oropharyngeal mucosa is normal. No neck masses or thyromegaly noted. Cardiac exam with regular rhythm normal S1 and S2. No murmur or gallop noted. Lungs are clear to auscultation. Abdomen is full with bulging flanks. Abdomen is soft with easily palpable spleen. Liver edge is firm with prominent left lobe.  No LE edema or clubbing noted.  Labs/studies Results: Lab results from 04/28/2014 ([facility])  WBC 3.0, H&H 9.6 and 29 and platelet count 40 7K INR 3.7 Bilirubin 1.3, AP 94, AST 31, ALT 42 and albumin 3.3. 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 Liver Doppler ultrasound on 04/28/2014 revealed same findings as on MR   Assessment:  #1. Advanced cirrhosis. She is now listed in 5 states for liver transplant. She has extremely poor quality of life on account of chronic liver disease. Recent meld score was 24. #2. Portal vein thrombosis recently diagnosed at [facility] and she is on warfarin. #3. Chronic insomnia secondary to anxiety and chronic disease. #4. Right shoulder pain felt  to be either musculoskeletal or right diaphragm irritation. Patient not interested in further evaluation unless pain got worse. #5. History of SBP twice. She seemed to be doing better with Levaquin prophylaxis. Hopefully she will be able to get liver transplant soon and come off Levaquin before she develops tendinitis or other complications.  Plan:  New prescription given for Xifaxan 550 mg by mouth twice a day. New prescription given for baclofen 10 mg 3 times a day when necessary. Patient advised to call if right shoulder pain gets worse. Office visit in 4 months since she is scheduled to see Dr. Monica Martinez in two months.

## 2014-05-13 NOTE — Patient Instructions (Signed)
Notify if right shoulder pain gets worse

## 2014-06-05 ENCOUNTER — Other Ambulatory Visit (HOSPITAL_COMMUNITY): Payer: Self-pay | Admitting: Family Medicine

## 2014-06-05 ENCOUNTER — Ambulatory Visit (HOSPITAL_COMMUNITY)
Admission: RE | Admit: 2014-06-05 | Discharge: 2014-06-05 | Disposition: A | Discharge status: Home / Self Care | Payer: Medicare Other | Source: Ambulatory Visit | Attending: Family Medicine | Admitting: Family Medicine

## 2014-06-05 DIAGNOSIS — M25511 Pain in right shoulder: Secondary | ICD-10-CM | POA: Insufficient documentation

## 2014-06-24 ENCOUNTER — Other Ambulatory Visit (HOSPITAL_COMMUNITY): Payer: Self-pay | Admitting: Family Medicine

## 2014-06-24 DIAGNOSIS — M25511 Pain in right shoulder: Secondary | ICD-10-CM

## 2014-07-03 ENCOUNTER — Ambulatory Visit (HOSPITAL_COMMUNITY): Admission: RE | Admit: 2014-07-03 | Payer: Medicare Other | Source: Ambulatory Visit

## 2014-07-03 ENCOUNTER — Ambulatory Visit (HOSPITAL_COMMUNITY)
Admission: RE | Admit: 2014-07-03 | Discharge: 2014-07-03 | Disposition: A | Discharge status: Home / Self Care | Payer: Medicare Other | Source: Ambulatory Visit | Attending: Family Medicine | Admitting: Family Medicine

## 2014-07-03 DIAGNOSIS — M25511 Pain in right shoulder: Secondary | ICD-10-CM

## 2014-07-15 ENCOUNTER — Ambulatory Visit (INDEPENDENT_AMBULATORY_CARE_PROVIDER_SITE_OTHER): Payer: 59 | Admitting: Internal Medicine

## 2014-08-15 ENCOUNTER — Other Ambulatory Visit (INDEPENDENT_AMBULATORY_CARE_PROVIDER_SITE_OTHER): Payer: Self-pay | Admitting: *Deleted

## 2014-08-15 DIAGNOSIS — G4700 Insomnia, unspecified: Secondary | ICD-10-CM

## 2014-08-15 MED ORDER — CYCLOBENZAPRINE HCL 10 MG PO TABS: 10.0000 mg | ORAL_TABLET | Freq: Three times a day (TID) | ORAL | Status: DC | PRN

## 2014-08-15 MED ORDER — DULOXETINE HCL 30 MG PO CPEP: 30.0000 mg | ORAL_CAPSULE | Freq: Every day | ORAL | Status: DC

## 2014-08-15 MED ORDER — TRAZODONE HCL 50 MG PO TABS: 50.0000 mg | ORAL_TABLET | Freq: Every day | ORAL | Status: DC

## 2014-08-15 NOTE — Telephone Encounter (Signed)
Dr.Rehman is refilling one times as her PCP is out of town and the Pharmacy has sent the request to their office and [facility] has denied three times. Dr.Fusco is out of town and will not be back prior to the patient running out of these medications.

## 2014-08-19 ENCOUNTER — Other Ambulatory Visit: Payer: Self-pay | Admitting: Internal Medicine

## 2014-08-19 DIAGNOSIS — Z1231 Encounter for screening mammogram for malignant neoplasm of breast: Secondary | ICD-10-CM

## 2014-09-01 ENCOUNTER — Ambulatory Visit (INDEPENDENT_AMBULATORY_CARE_PROVIDER_SITE_OTHER): Payer: Medicare Other | Admitting: Internal Medicine

## 2014-09-01 ENCOUNTER — Encounter (INDEPENDENT_AMBULATORY_CARE_PROVIDER_SITE_OTHER): Payer: Self-pay | Admitting: Internal Medicine

## 2014-09-01 VITALS — BP 130/80 | HR 76 | Temp 98.2°F | Resp 18 | Ht 66.0 in | Wt 154.2 lb

## 2014-09-01 DIAGNOSIS — K7290 Hepatic failure, unspecified without coma: Secondary | ICD-10-CM

## 2014-09-01 DIAGNOSIS — K7682 Hepatic encephalopathy: Secondary | ICD-10-CM

## 2014-09-01 DIAGNOSIS — K219 Gastro-esophageal reflux disease without esophagitis: Secondary | ICD-10-CM

## 2014-09-01 DIAGNOSIS — K7460 Unspecified cirrhosis of liver: Secondary | ICD-10-CM

## 2014-09-01 DIAGNOSIS — R188 Other ascites: Secondary | ICD-10-CM

## 2014-09-01 NOTE — Patient Instructions (Addendum)
Call if you weight drops below 150 pounds. Call if you have abdominal pain or ascites recurs.

## 2014-09-01 NOTE — Progress Notes (Signed)
Presenting complaint;  Follow-up for chronic liver disease GERD. Patient reports recent episode of rectal bleeding.  Subjective:  Patient is 54 year old Caucasian female who presents for scheduled visit. She remains listed for liver transplant at [facility]. She states his been delayed because of finding of portal vein thrombosis and she is on warfarin. She is scheduled for MR at [facility] next week. She states about 2 weeks ago she had few days of bright red blood per rectum. She hasn't seen any blood in the last 8 days. She is having 3 bowel movements per day. She denies melena. She says appetite is good. She also denies nausea vomiting or heartburn. She continues to feel weak and tired all the time. She is also worried about her son who is having problems with cervical dystonia and has to go back and forced to [facility]. She denies confusion. She states INR was 2.3 last week and she will have it repeated again this Friday.   Current Medications: Outpatient Encounter Prescriptions as of 09/01/2014  Medication Sig  . b complex vitamins tablet Take 1 tablet by mouth daily.  . baclofen (LIORESAL) 10 MG tablet Take 1 tablet (10 mg total) by mouth 3 (three) times daily as needed for muscle spasms (alternates with baclofen).  . cyclobenzaprine (FLEXERIL) 10 MG tablet Take 1 tablet (10 mg total) by mouth 3 (three) times daily as needed for muscle spasms. As needed  . DULoxetine (CYMBALTA) 30 MG capsule Take 1 capsule (30 mg total) by mouth daily.  . ferrous sulfate 325 (65 FE) MG tablet Take 325 mg by mouth 2 (two) times daily with a meal.  . furosemide (LASIX) 40 MG tablet Take 80 mg by mouth daily.   Marland Kitchen lactulose (CHRONULAC) 10 GM/15ML solution Take 45 mLs (30 g total) by mouth 3 (three) times daily. (Patient taking differently: Take 30 g by mouth 2 (two) times daily as needed for mild constipation. )  . LANTUS SOLOSTAR 100 UNIT/ML Solostar Pen Inject 10 Units into the skin every evening.   Marland Kitchen  levofloxacin (LEVAQUIN) 500 MG tablet TAKE (1) TABLET BY MOUTH ONCE DAILY.  . Multiple Vitamin (MULTIVITAMIN WITH MINERALS) TABS tablet Take 1 tablet by mouth daily.  . nadolol (CORGARD) 20 MG tablet Take 10 mg by mouth daily.  Marland Kitchen omeprazole (PRILOSEC) 20 MG capsule TAKE (1) CAPSULE TWICE DAILY BEFORE MEALS.  . rifaximin (XIFAXAN) 550 MG TABS tablet Take 1 tablet (550 mg total) by mouth 2 (two) times daily.  Marland Kitchen spironolactone (ALDACTONE) 100 MG tablet Take 100 mg by mouth 3 (three) times daily.   . traZODone (DESYREL) 50 MG tablet Take 1 tablet (50 mg total) by mouth at bedtime.  Marland Kitchen warfarin (COUMADIN) 3 MG tablet Take 5 mg by mouth daily.    No facility-administered encounter medications on file as of 09/01/2014.     Objective: Blood pressure 130/80, pulse 76, temperature 98.2 F (36.8 C), temperature source Oral, resp. rate 18, height 5\' 6"  (1.676 m), weight 154 lb 3.2 oz (69.945 kg). Patient is alert and in no acute distress. Asterixis absent. Conjunctiva is pink. Sclera is nonicteric Oropharyngeal mucosa is normal. No neck masses or thyromegaly noted. Cardiac exam with regular rhythm normal S1 and S2. Faint systolic murmur noted at left lower sternal border. Lungs are clear to auscultation. Abdomen abdomen is full with bulging flanks. Large spleen at least 10 cm below left costal margin. Liver edge is indistinct. Shifting dullness is absent. No LE edema or clubbing noted.  Labs/studies Results: Labs from 07/10/2014 from [facility] reviewed  H&H was 9 and 29 and platelet count 51K    Assessment:  #1. Decompensated cirrhosis secondary to successfully treated hepatitis C over 10 years ago. Her disease has been complicated by ascites and hepatic encephalopathy as well as history of SBP. Now she has developed portal vein thrombosis and is on warfarin. She has been listed for liver transplant since November 2015 [place]. #2. GERD. Patient would like for PPI would to be changed  because omeprazole would not be covered anymore #3. Recent episode of rectal bleeding lasting for several days. Last colonoscopy was in 2013 when she had few small polyps removed. Suspect hemorrhoidal bleeding. If she has further episodes of bleeding with drop in H&H she may have to be further evaluated with colonoscopy.    Plan:  Patient will call if she develops abdominal pain or recurrent ascites. Patient advised to call office if her weight drops below 150 pounds in which case diarrhetic therapy may have to be altered. Will change PPI once she find out which one would be cost effective.

## 2014-09-23 ENCOUNTER — Ambulatory Visit
Admission: RE | Admit: 2014-09-23 | Discharge: 2014-09-23 | Disposition: A | Discharge status: Home / Self Care | Payer: Medicare Other | Source: Ambulatory Visit | Attending: Internal Medicine | Admitting: Internal Medicine

## 2014-09-23 ENCOUNTER — Other Ambulatory Visit: Payer: Self-pay | Admitting: Internal Medicine

## 2014-09-23 DIAGNOSIS — Z1231 Encounter for screening mammogram for malignant neoplasm of breast: Secondary | ICD-10-CM

## 2014-10-30 ENCOUNTER — Other Ambulatory Visit (INDEPENDENT_AMBULATORY_CARE_PROVIDER_SITE_OTHER): Payer: Self-pay | Admitting: Internal Medicine

## 2014-10-30 DIAGNOSIS — R188 Other ascites: Secondary | ICD-10-CM

## 2014-10-31 ENCOUNTER — Ambulatory Visit (HOSPITAL_COMMUNITY)
Admission: RE | Admit: 2014-10-31 | Discharge: 2014-10-31 | Disposition: A | Discharge status: Home / Self Care | Payer: Medicare Other | Source: Ambulatory Visit | Attending: Internal Medicine | Admitting: Internal Medicine

## 2014-10-31 ENCOUNTER — Encounter (HOSPITAL_COMMUNITY): Payer: Self-pay

## 2014-10-31 DIAGNOSIS — R188 Other ascites: Secondary | ICD-10-CM | POA: Diagnosis not present

## 2014-10-31 LAB — BODY FLUID CELL COUNT WITH DIFFERENTIAL
Eos, Fluid: 0 %
Lymphs, Fluid: 12 %
Monocyte-Macrophage-Serous Fluid: 67 % (ref 50–90)
NEUTROPHIL FLUID: 21 % (ref 0–25)
WBC FLUID: 205 uL (ref 0–1000)

## 2014-10-31 MED ORDER — ALBUMIN HUMAN 25 % IV SOLN
50.0000 g | Freq: Once | INTRAVENOUS | Status: AC
  Administered 2014-10-31: 50 g via INTRAVENOUS

## 2014-10-31 NOTE — Sedation Documentation (Signed)
2100cc yellow fluid drained without difficulty.

## 2014-10-31 NOTE — Sedation Documentation (Signed)
Vital signs stable. 

## 2014-10-31 NOTE — Sedation Documentation (Signed)
Numbing medication was given by MD at bedside. Pt tolerating well.

## 2014-10-31 NOTE — Sedation Documentation (Signed)
Patient is resting comfortably. 

## 2014-10-31 NOTE — Sedation Documentation (Signed)
Patient denies pain and is resting comfortably.  

## 2014-10-31 NOTE — Discharge Instructions (Signed)
Ascites °Ascites is a gathering of fluid in the belly (abdomen). This is most often caused by liver disease. It may also be caused by a number of other less common problems. It causes a ballooning out (distension) of the abdomen. °CAUSES  °Scarring of the liver (cirrhosis) is the most common cause of ascites. Other causes include: °· Infection or inflammation in the abdomen. °· Cancer in the abdomen. °· Heart failure. °· Certain forms of kidney failure (nephritic syndrome). °· Inflammation of the pancreas. °· Clots in the veins of the liver. °SYMPTOMS  °In the early stages of ascites, you may not have any symptoms. The main symptom of ascites is a sense of abdominal bloating. This is due to the presence of fluid. This may also cause an increase in abdominal or waist size. People with this condition can develop swelling in the legs, and men can develop a swollen scrotum. When there is a lot of fluid, it may be hard to breath. Stretching of the abdomen by fluid can be painful. °DIAGNOSIS  °Certain features of your medical history, such as a history of liver disease and of an enlarging abdomen, can suggest the presence of ascites. The diagnosis of ascites can be made on physical exam by your caregiver. An abdominal ultrasound examination can confirm that ascites is present, and estimate the amount of fluid. °Once ascites is confirmed, it is important to determine its cause. Again, a history of one of the conditions listed in "CAUSES" provides a strong clue. A physical exam is important, and blood and X-ray tests may be needed. During a procedure called paracentesis, a sample of fluid is removed from the abdomen. This can determine certain key features about the fluid, such as whether or not infection or cancer is present. Your caregiver will determine if a paracentesis is necessary. They will describe the procedure to you. °PREVENTION  °Ascites is a complication of other conditions. Therefore to prevent ascites, you  must seek treatment for any significant health conditions you have. Once ascites is present, careful attention to fluid and salt intake may help prevent it from getting worse. If you have ascites, you should not drink alcohol. °PROGNOSIS  °The prognosis of ascites depends on the underlying disease. If the disease is reversible, such as with certain infections or with heart failure, then ascites may improve or disappear. When ascites is caused by cirrhosis, then it indicates that the liver disease has worsened, and further evaluation and treatment of the liver disease is needed. If your ascites is caused by cancer, then the success or failure of the cancer treatment will determine whether your ascites will improve or worsen. °RISKS AND COMPLICATIONS  °Ascites is likely to worsen if it is not properly diagnosed and treated. A large amount of ascites can cause pain and difficulty breathing. The main complication, besides worsening, is infection (called spontaneous bacterial peritonitis). This requires prompt treatment. °TREATMENT  °The treatment of ascites depends on its cause. When liver disease is your cause, medical management using water pills (diuretics) and decreasing salt intake is often effective. Ascites due to peritoneal inflammation or malignancy (cancer) alone does not respond to salt restriction and diuretics. Hospitalization is sometimes required. °If the treatment of ascites cannot be managed with medications, a number of other treatments are available. Your caregivers will help you decide which will work best for you. Some of these are: °· Removal of fluid from the abdomen (paracentesis). °· Fluid from the abdomen is passed into a vein (peritoneovenous shunting). °·   Liver transplantation. °· Transjugular intrahepatic portosystemic stent shunt. °HOME CARE INSTRUCTIONS  °It is important to monitor body weight and the intake and output of fluids. Weigh yourself at the same time every day. Record your  weights. Fluid restriction may be necessary. It is also important to know your salt intake. The more salt you take in, the more fluid you will retain. Ninety percent of people with ascites respond to this approach. °· Follow any directions for medicines carefully. °· Follow up with your caregiver, as directed. °· Report any changes in your health, especially any new or worsening symptoms. °· If your ascites is from liver disease, avoid alcohol and other substances toxic to the liver. °SEEK MEDICAL CARE IF:  °· Your weight increases more than a few pounds in a few days. °· Your abdominal or waist size increases. °· You develop swelling in your legs. °· You had swelling and it worsens. °SEEK IMMEDIATE MEDICAL CARE IF:  °· You develop a fever. °· You develop new abdominal pain. °· You develop difficulty breathing. °· You develop confusion. °· You have bleeding from the mouth, stomach, or rectum. °MAKE SURE YOU:  °· Understand these instructions. °· Will watch your condition. °· Will get help right away if you are not doing well or get worse. °Document Released: 01/24/2005 Document Revised: 04/18/2011 Document Reviewed: 08/25/2006 °ExitCare® Patient Information ©2015 [employer]. This information is not intended to replace advice given to you by your health care provider. Make sure you discuss any questions you have with your health care provider. ° °

## 2014-11-03 LAB — GRAM STAIN: GRAM STAIN: NONE SEEN

## 2014-11-03 LAB — PATHOLOGIST SMEAR REVIEW

## 2014-11-05 LAB — CULTURE, BODY FLUID W GRAM STAIN -BOTTLE: Culture: NO GROWTH

## 2014-11-05 LAB — CULTURE, BODY FLUID-BOTTLE

## 2014-12-16 ENCOUNTER — Ambulatory Visit (INDEPENDENT_AMBULATORY_CARE_PROVIDER_SITE_OTHER): Payer: Medicare Other | Admitting: Internal Medicine

## 2014-12-29 ENCOUNTER — Encounter (INDEPENDENT_AMBULATORY_CARE_PROVIDER_SITE_OTHER): Payer: Self-pay | Admitting: Internal Medicine

## 2014-12-29 ENCOUNTER — Ambulatory Visit (INDEPENDENT_AMBULATORY_CARE_PROVIDER_SITE_OTHER): Payer: Medicare Other | Admitting: Internal Medicine

## 2014-12-29 VITALS — BP 110/66 | HR 84 | Temp 98.3°F | Resp 18 | Ht 66.0 in | Wt 160.0 lb

## 2014-12-29 DIAGNOSIS — K7290 Hepatic failure, unspecified without coma: Secondary | ICD-10-CM

## 2014-12-29 DIAGNOSIS — R188 Other ascites: Secondary | ICD-10-CM

## 2014-12-29 DIAGNOSIS — K7460 Unspecified cirrhosis of liver: Secondary | ICD-10-CM | POA: Diagnosis not present

## 2014-12-29 DIAGNOSIS — L299 Pruritus, unspecified: Secondary | ICD-10-CM | POA: Diagnosis not present

## 2014-12-29 DIAGNOSIS — R5383 Other fatigue: Secondary | ICD-10-CM

## 2014-12-29 DIAGNOSIS — K7682 Hepatic encephalopathy: Secondary | ICD-10-CM

## 2014-12-29 NOTE — Progress Notes (Addendum)
Presenting complaint;  Abdominal distention and pruritus. History of cirrhosis.  Subjective:  [name] is 54 year old Caucasian female who is here for scheduled visit. She was last seen on 09/01/2014. She remains listed for liver transplant at [facility] and was seen there on 12/24/2014. She continues to complain of extreme weakness. She manages to take her for usual household chores but it takes her forever. She says she gets extremely weak after simple past such as taking a shower and has to wait several minutes before she can carry on. She feels her abdomen is getting more distended. She denies pain fever or chills. She says she had 2 MRIs in September 2016 but nothing significant was found. Next appointment at [facility] is in December 2016. She complains of pruritus. Started few weeks ago and is worse at night and she's not able to rest. She was given prescription for Atarax by Dr. Gerarda Fraction but she decided not to take it. Her appetite is fair. She denies nausea vomiting melena or rectal bleeding. She is having 2-3 bowel movements per day. She denies confusion. She is not taking Cymbalta anymore. She is on warfarin for portal vein thrombosis and doses being managed by [facility]. Her transplant coordinator is Ms. Dushun Mccowen.   Current Medications: Outpatient Encounter Prescriptions as of 12/29/2014  Medication Sig  . b complex vitamins tablet Take 1 tablet by mouth daily.  . baclofen (LIORESAL) 10 MG tablet Take 1 tablet (10 mg total) by mouth 3 (three) times daily as needed for muscle spasms (alternates with baclofen).  . cyclobenzaprine (FLEXERIL) 10 MG tablet Take 1 tablet (10 mg total) by mouth 3 (three) times daily as needed for muscle spasms. As needed  . ferrous sulfate 325 (65 FE) MG tablet Take 325 mg by mouth 2 (two) times daily with a meal.  . furosemide (LASIX) 40 MG tablet Take 80 mg by mouth daily.   Marland Kitchen lactulose (CHRONULAC) 10 GM/15ML solution Take 45 mLs (30 g  total) by mouth 3 (three) times daily. (Patient taking differently: Take 30 g by mouth 2 (two) times daily as needed for mild constipation. )  . LANTUS SOLOSTAR 100 UNIT/ML Solostar Pen Inject 20 Units into the skin every evening.   Marland Kitchen levofloxacin (LEVAQUIN) 500 MG tablet TAKE (1) TABLET BY MOUTH ONCE DAILY.  . Multiple Vitamin (MULTIVITAMIN WITH MINERALS) TABS tablet Take 1 tablet by mouth daily.  . nadolol (CORGARD) 20 MG tablet Take 10 mg by mouth daily.  . rifaximin (XIFAXAN) 550 MG TABS tablet Take 1 tablet (550 mg total) by mouth 2 (two) times daily.  Marland Kitchen spironolactone (ALDACTONE) 100 MG tablet Take 300 mg by mouth daily.   . traZODone (DESYREL) 50 MG tablet Take 1 tablet (50 mg total) by mouth at bedtime.  Marland Kitchen warfarin (COUMADIN) 3 MG tablet Take 5 mg by mouth daily.   . DULoxetine (CYMBALTA) 30 MG capsule Take 1 capsule (30 mg total) by mouth daily. (Patient not taking: Reported on 12/29/2014)  . omeprazole (PRILOSEC) 20 MG capsule TAKE (1) CAPSULE TWICE DAILY BEFORE MEALS. (Patient not taking: Reported on 12/29/2014)   No facility-administered encounter medications on file as of 12/29/2014.     Objective: Blood pressure 110/66, pulse 84, temperature 98.3 F (36.8 C), temperature source Oral, resp. rate 18, height 5\' 6"  (1.676 m), weight 160 lb (72.576 kg). Patient is alert and in no acute distress. Asterixis absent. Conjunctiva is pink. Sclera is nonicteric Oropharyngeal mucosa is normal. No neck masses or  thyromegaly noted. Cardiac exam with regular rhythm normal S1 and S2. Grade 2/6 systolic ejection murmur best heard at left upper sternal border. Lungs are clear to auscultation. Abdomen is distended with dull flanks. It is soft with easily palpable spleen. She has mild tenderness in right midabdomen. No LE edema or clubbing noted.  Labs/studies Results: Lab data from 12/24/2014([facility]). Serum albumin 3.5  Serum bilirubin 0.8  Creatinine 1.06 and serum sodium 135   INR 2.0 AFP  2.72 WBC 2.2 H&H 8.1 26.9 Platelet count 61K    Assessment:  #1. Advanced cirrhosis secondary to successfully treated chronic hepatitis C over 12 years ago. Disease complicated by hepatic encephalopathy and ascites and she's had 2 episodes of SBP. She hasn't had a relapse and she has been on Levaquin. #2. Anemia of chronic disease. #3. Fatigue secondary to chronic illness. #4. Ascites. She has significant ascites but not to the point that it needs to be tapped. #5. Pruritus of recent onset. She does not have cholestatic disease. Will check with Dr. Monica Martinez if she should be treated with [place]so or symptomatically. #6. Portal vein thrombosis. Patient is on warfarin and doses being managed while [facility].   Plan:  Cymbalta removed from list of medications as she is not taking it. Will check with patient's transplant hepatologist Dr. Monica Martinez at [facility] if she would benefit from [place] as for as pruritus is concerned. Patient will call if ascites becomes tense. Office visit in 2 months.

## 2014-12-29 NOTE — Patient Instructions (Signed)
Notify if ascites becomes tense. We will check with Dr. Monica Martinez if you need to be on Urso.

## 2015-01-12 ENCOUNTER — Telehealth (INDEPENDENT_AMBULATORY_CARE_PROVIDER_SITE_OTHER): Payer: Self-pay | Admitting: *Deleted

## 2015-01-12 NOTE — Telephone Encounter (Signed)
Patient states that she cannot get rid of the fluid. She is short of breathe. States that she will have to wait a few days as she is now on Coumadin. Taking 5 mg daily. She felt that Dr.Rehman was going to talk to Dr. Monica Martinez.  To discuss with Dr.Rehman.  Per Dr.Rehman he patient should call her transplant coordinator and share this with them. It may need to be done there. If they say that they want [place] to arrange it , then we will.  Dr.Rehman was going to talk with Dr.Hyasshi about the patient taking Atarax for her itching. Patient was called and told. She will call them tomorrow , she states that it will be Wednesday before she will here back. Prissy will let our office know the outcome.

## 2015-01-15 ENCOUNTER — Other Ambulatory Visit (INDEPENDENT_AMBULATORY_CARE_PROVIDER_SITE_OTHER): Payer: Self-pay | Admitting: Internal Medicine

## 2015-01-15 MED ORDER — CHOLESTYRAMINE 4 G PO PACK: 4.0000 g | PACK | Freq: Two times a day (BID) | ORAL | Status: DC

## 2015-02-26 ENCOUNTER — Encounter (INDEPENDENT_AMBULATORY_CARE_PROVIDER_SITE_OTHER): Payer: Self-pay

## 2015-03-12 DIAGNOSIS — K7290 Hepatic failure, unspecified without coma: Secondary | ICD-10-CM | POA: Diagnosis not present

## 2015-03-17 ENCOUNTER — Ambulatory Visit (INDEPENDENT_AMBULATORY_CARE_PROVIDER_SITE_OTHER): Payer: Medicare Other | Admitting: Internal Medicine

## 2015-03-18 DIAGNOSIS — Z1389 Encounter for screening for other disorder: Secondary | ICD-10-CM | POA: Diagnosis not present

## 2015-03-18 DIAGNOSIS — B182 Chronic viral hepatitis C: Secondary | ICD-10-CM | POA: Diagnosis not present

## 2015-03-18 DIAGNOSIS — I81 Portal vein thrombosis: Secondary | ICD-10-CM | POA: Diagnosis not present

## 2015-03-18 DIAGNOSIS — K7290 Hepatic failure, unspecified without coma: Secondary | ICD-10-CM | POA: Diagnosis not present

## 2015-03-18 DIAGNOSIS — Z6825 Body mass index (BMI) 25.0-25.9, adult: Secondary | ICD-10-CM | POA: Diagnosis not present

## 2015-03-18 DIAGNOSIS — R188 Other ascites: Secondary | ICD-10-CM | POA: Diagnosis not present

## 2015-03-26 DIAGNOSIS — Z6825 Body mass index (BMI) 25.0-25.9, adult: Secondary | ICD-10-CM | POA: Diagnosis not present

## 2015-03-26 DIAGNOSIS — Z01818 Encounter for other preprocedural examination: Secondary | ICD-10-CM | POA: Diagnosis not present

## 2015-03-26 DIAGNOSIS — K7290 Hepatic failure, unspecified without coma: Secondary | ICD-10-CM | POA: Diagnosis not present

## 2015-03-26 DIAGNOSIS — D508 Other iron deficiency anemias: Secondary | ICD-10-CM | POA: Diagnosis not present

## 2015-03-27 ENCOUNTER — Encounter (INDEPENDENT_AMBULATORY_CARE_PROVIDER_SITE_OTHER): Payer: Self-pay

## 2015-03-31 DIAGNOSIS — E663 Overweight: Secondary | ICD-10-CM | POA: Diagnosis not present

## 2015-03-31 DIAGNOSIS — Z6826 Body mass index (BMI) 26.0-26.9, adult: Secondary | ICD-10-CM | POA: Diagnosis not present

## 2015-03-31 DIAGNOSIS — J069 Acute upper respiratory infection, unspecified: Secondary | ICD-10-CM | POA: Diagnosis not present

## 2015-03-31 DIAGNOSIS — J209 Acute bronchitis, unspecified: Secondary | ICD-10-CM | POA: Diagnosis not present

## 2015-03-31 DIAGNOSIS — Z1389 Encounter for screening for other disorder: Secondary | ICD-10-CM | POA: Diagnosis not present

## 2015-04-06 DIAGNOSIS — K7290 Hepatic failure, unspecified without coma: Secondary | ICD-10-CM | POA: Diagnosis not present

## 2015-04-16 ENCOUNTER — Encounter (INDEPENDENT_AMBULATORY_CARE_PROVIDER_SITE_OTHER): Payer: Self-pay

## 2015-04-16 DIAGNOSIS — K7290 Hepatic failure, unspecified without coma: Secondary | ICD-10-CM | POA: Diagnosis not present

## 2015-04-23 DIAGNOSIS — K7290 Hepatic failure, unspecified without coma: Secondary | ICD-10-CM | POA: Diagnosis not present

## 2015-05-05 DIAGNOSIS — K7290 Hepatic failure, unspecified without coma: Secondary | ICD-10-CM | POA: Diagnosis not present

## 2015-05-12 DIAGNOSIS — K7290 Hepatic failure, unspecified without coma: Secondary | ICD-10-CM | POA: Diagnosis not present

## 2015-05-19 DIAGNOSIS — K7290 Hepatic failure, unspecified without coma: Secondary | ICD-10-CM | POA: Diagnosis not present

## 2015-05-27 DIAGNOSIS — K7290 Hepatic failure, unspecified without coma: Secondary | ICD-10-CM | POA: Diagnosis not present

## 2015-05-28 ENCOUNTER — Telehealth (INDEPENDENT_AMBULATORY_CARE_PROVIDER_SITE_OTHER): Payer: Self-pay | Admitting: Internal Medicine

## 2015-05-28 DIAGNOSIS — R197 Diarrhea, unspecified: Secondary | ICD-10-CM

## 2015-05-28 NOTE — Telephone Encounter (Signed)
Patient called, stated that she was on the transplant list in [facility], but she has 3 blood clots and they removed her from the list.   Stated that she is having diarrhea (like water) for a while now and her coordinator advised her to call us to see if she could get something for this or to see if she needs to be seen.    [phone]

## 2015-05-28 NOTE — Telephone Encounter (Signed)
To be addressed with Dr.Rehman. 

## 2015-05-29 NOTE — Telephone Encounter (Signed)
Per Dr.Rehman the patient should decrease Lactulose to 1 tbs 2-3 times daily. Get stool culture , O&P,Fecal Lactoferrin. Patient was called and made aware and lab orders were completed and lab aware.

## 2015-06-01 DIAGNOSIS — R197 Diarrhea, unspecified: Secondary | ICD-10-CM | POA: Diagnosis not present

## 2015-06-02 LAB — FECAL LACTOFERRIN, QUANT: Lactoferrin: NEGATIVE

## 2015-06-02 LAB — OVA AND PARASITE EXAMINATION: OP: NONE SEEN

## 2015-06-04 DIAGNOSIS — Z01818 Encounter for other preprocedural examination: Secondary | ICD-10-CM | POA: Diagnosis not present

## 2015-06-04 DIAGNOSIS — K7460 Unspecified cirrhosis of liver: Secondary | ICD-10-CM | POA: Diagnosis not present

## 2015-06-04 DIAGNOSIS — K7290 Hepatic failure, unspecified without coma: Secondary | ICD-10-CM | POA: Diagnosis not present

## 2015-06-04 DIAGNOSIS — K766 Portal hypertension: Secondary | ICD-10-CM | POA: Diagnosis not present

## 2015-06-04 DIAGNOSIS — K7689 Other specified diseases of liver: Secondary | ICD-10-CM | POA: Diagnosis not present

## 2015-06-05 LAB — STOOL CULTURE

## 2015-06-10 DIAGNOSIS — K7290 Hepatic failure, unspecified without coma: Secondary | ICD-10-CM | POA: Diagnosis not present

## 2015-06-10 NOTE — Telephone Encounter (Signed)
Called patient and offered her a May appointment, she was not able to come in May due to responsibilities she has with her son's appointments.  She requested an appointment for June.  She was given an appointment for 07/09/15 at 4pm.

## 2015-06-16 DIAGNOSIS — K7460 Unspecified cirrhosis of liver: Secondary | ICD-10-CM | POA: Diagnosis not present

## 2015-06-16 DIAGNOSIS — Z6823 Body mass index (BMI) 23.0-23.9, adult: Secondary | ICD-10-CM | POA: Diagnosis not present

## 2015-06-17 DIAGNOSIS — K7290 Hepatic failure, unspecified without coma: Secondary | ICD-10-CM | POA: Diagnosis not present

## 2015-06-24 DIAGNOSIS — K7290 Hepatic failure, unspecified without coma: Secondary | ICD-10-CM | POA: Diagnosis not present

## 2015-07-08 DIAGNOSIS — K7290 Hepatic failure, unspecified without coma: Secondary | ICD-10-CM | POA: Diagnosis not present

## 2015-07-09 ENCOUNTER — Encounter (INDEPENDENT_AMBULATORY_CARE_PROVIDER_SITE_OTHER): Payer: Self-pay | Admitting: Internal Medicine

## 2015-07-09 ENCOUNTER — Ambulatory Visit (INDEPENDENT_AMBULATORY_CARE_PROVIDER_SITE_OTHER): Payer: Medicare Other | Admitting: Internal Medicine

## 2015-07-09 VITALS — BP 112/66 | HR 82 | Temp 98.2°F | Resp 18 | Ht 66.0 in | Wt 156.0 lb

## 2015-07-09 DIAGNOSIS — I8290 Acute embolism and thrombosis of unspecified vein: Secondary | ICD-10-CM

## 2015-07-09 DIAGNOSIS — K7290 Hepatic failure, unspecified without coma: Secondary | ICD-10-CM | POA: Diagnosis not present

## 2015-07-09 DIAGNOSIS — K7682 Hepatic encephalopathy: Secondary | ICD-10-CM

## 2015-07-09 DIAGNOSIS — K7460 Unspecified cirrhosis of liver: Secondary | ICD-10-CM

## 2015-07-09 DIAGNOSIS — R188 Other ascites: Secondary | ICD-10-CM

## 2015-07-09 MED ORDER — APIXABAN 2.5 MG PO TABS: 2.5000 mg | ORAL_TABLET | Freq: Two times a day (BID) | ORAL | Status: DC

## 2015-07-09 NOTE — Patient Instructions (Addendum)
Stop warfarin. Begin Eliquis and 07/11/2015. Notify if you have rectal bleeding or melena.

## 2015-07-09 NOTE — Progress Notes (Signed)
Presenting complaint;  Follow-up for decompensated chronic liver disease.  Subjective:  Patient is 55 year old Caucasian female who is her for scheduled visit. She was last seen on 11/21/016.  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  She wants to know what to expect because of her liver disese and what is her prognosis and  she also wants to be swicthed to another anticoagulant beuse she is sick of having INR every 4 weeks. She continues to feel tired. She feels her ascites add pleural effusion is gradually increasing but she is not a point that she a tap. She denies melena or rectal bleeding. She is seeing hematologist for chronic anemia at [facility].   Current Medications: Outpatient Encounter Prescriptions as of 07/09/2015  Medication Sig  . b complex vitamins tablet Take 1 tablet by mouth daily.  . baclofen (LIORESAL) 10 MG tablet Take 1 tablet (10 mg total) by mouth 3 (three) times daily as needed for muscle spasms (alternates with baclofen).  . citalopram (CELEXA) 10 MG tablet Take 10 mg by mouth daily.   . cyclobenzaprine (FLEXERIL) 10 MG tablet Take 1 tablet (10 mg total) by mouth 3 (three) times daily as needed for muscle spasms. As needed  . ferrous sulfate 325 (65 FE) MG tablet Take 325 mg by mouth 2 (two) times daily with a meal.  . furosemide (LASIX) 40 MG tablet Take 100 mg by mouth daily.   Marland Kitchen lactulose (CHRONULAC) 10 GM/15ML solution Take 45 mLs (30 g total) by mouth 3 (three) times daily. (Patient taking differently: Take 30 g by mouth 2 (two) times daily as needed for mild constipation. )  . LANTUS SOLOSTAR 100 UNIT/ML Solostar Pen Inject 20 Units into the skin every evening.   Marland Kitchen levofloxacin (LEVAQUIN) 500 MG tablet TAKE (1) TABLET BY MOUTH ONCE DAILY.  . Multiple Vitamin (MULTIVITAMIN WITH MINERALS) TABS tablet Take 1 tablet by mouth daily.  .  nadolol (CORGARD) 20 MG tablet Take 10 mg by mouth daily.  . rifaximin (XIFAXAN) 550 MG TABS tablet Take 1 tablet (550 mg total) by mouth 2 (two) times daily.  Marland Kitchen spironolactone (ALDACTONE) 100 MG tablet Take 300 mg by mouth daily.   . traZODone (DESYREL) 50 MG tablet Take 1 tablet (50 mg total) by mouth at bedtime.  . VENTOLIN HFA 108 (90 Base) MCG/ACT inhaler Inhale 1-2 puffs into the lungs as needed.   . warfarin (COUMADIN) 1 MG tablet Take 1 mg by mouth. Patient takes a 3 mg daily , then 2 1 mg daily to equal 5mg  daily.  Marland Kitchen warfarin (COUMADIN) 3 MG tablet Take 5 mg by mouth daily.   . [DISCONTINUED] cholestyramine (QUESTRAN) 4 G packet Take 1 packet (4 g total) by mouth 2 (two) times daily. Take this medication 2 hours before after taking other medications (Patient not taking: Reported on 07/09/2015)  . [DISCONTINUED] DULoxetine (CYMBALTA) 30 MG capsule Take 1 capsule (30 mg total) by mouth daily. (Patient not taking: Reported on 12/29/2014)  . [DISCONTINUED] omeprazole (PRILOSEC) 20 MG capsule TAKE (1) CAPSULE TWICE DAILY BEFORE MEALS. (Patient not taking: Reported on 12/29/2014)   No facility-administered encounter medications on file as of 07/09/2015.     Objective: Blood pressure 112/66, pulse 82, temperature 98.2 F (36.8 C), temperature source Oral, resp. rate 18, height 5\' 6"  (1.676 m), weight 156 lb (70.761 kg). Patient is  alert and in no acute distress. She does not have asterix. Conjunctiva is pink. Sclera is nonicteric Oropharyngeal mucosa is normal. No neck masses or thyromegaly noted. Cardiac exam with regular rhythm normal S1 and S2. No murmur or gallop noted. Breath sounds are absent at right base. Percussion note is dull Abdomen is distended with very large spleen. Both flanks and dull. No LE edema or clubbing noted.  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  Assessment:  #1. 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 Long-term prognosis is not good. She can contact other transplant centers to see if there are other treatment options. She will be switched to Eliquis and maybe she will have better luck in dissolving this clot. Risk of bleed is high and she is fully aware of it. #2. Hepatic encephalopathy stable with treatment #3. 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 She will need abdominal tap for symptomatic relief at some points. #4. Chronic anemia and thrombocytopenia.  Plan:  Discontinue warfarin. Begin Eliquis 2.5 mg by mouth daily starting on 07/11/2015. Patient advised to call office or report to emergency room if she develops melena or rectal bleeding hematuria vaginal bleeding or hematemesis. Patient will call if abdomen becomes more distended or if she has breathing difficulty. Office visit in one month.

## 2015-07-14 ENCOUNTER — Observation Stay (HOSPITAL_COMMUNITY)
Admission: EM | Admit: 2015-07-14 | Discharge: 2015-07-17 | Disposition: A | Discharge status: Home / Self Care | Payer: Medicare Other | Attending: Family Medicine | Admitting: Family Medicine

## 2015-07-14 ENCOUNTER — Emergency Department (HOSPITAL_COMMUNITY): Payer: Medicare Other

## 2015-07-14 ENCOUNTER — Telehealth (INDEPENDENT_AMBULATORY_CARE_PROVIDER_SITE_OTHER): Payer: Self-pay | Admitting: *Deleted

## 2015-07-14 ENCOUNTER — Encounter (HOSPITAL_COMMUNITY): Payer: Self-pay | Admitting: Emergency Medicine

## 2015-07-14 DIAGNOSIS — Z7901 Long term (current) use of anticoagulants: Secondary | ICD-10-CM | POA: Diagnosis not present

## 2015-07-14 DIAGNOSIS — K922 Gastrointestinal hemorrhage, unspecified: Secondary | ICD-10-CM | POA: Diagnosis present

## 2015-07-14 DIAGNOSIS — Z794 Long term (current) use of insulin: Secondary | ICD-10-CM | POA: Diagnosis not present

## 2015-07-14 DIAGNOSIS — E119 Type 2 diabetes mellitus without complications: Secondary | ICD-10-CM | POA: Diagnosis not present

## 2015-07-14 DIAGNOSIS — F329 Major depressive disorder, single episode, unspecified: Secondary | ICD-10-CM | POA: Insufficient documentation

## 2015-07-14 DIAGNOSIS — K7031 Alcoholic cirrhosis of liver with ascites: Secondary | ICD-10-CM | POA: Diagnosis not present

## 2015-07-14 DIAGNOSIS — B182 Chronic viral hepatitis C: Secondary | ICD-10-CM | POA: Diagnosis present

## 2015-07-14 DIAGNOSIS — I8510 Secondary esophageal varices without bleeding: Secondary | ICD-10-CM | POA: Insufficient documentation

## 2015-07-14 DIAGNOSIS — Z87891 Personal history of nicotine dependence: Secondary | ICD-10-CM | POA: Insufficient documentation

## 2015-07-14 DIAGNOSIS — Z7189 Other specified counseling: Secondary | ICD-10-CM | POA: Insufficient documentation

## 2015-07-14 DIAGNOSIS — Z7682 Awaiting organ transplant status: Secondary | ICD-10-CM | POA: Insufficient documentation

## 2015-07-14 DIAGNOSIS — Z8619 Personal history of other infectious and parasitic diseases: Secondary | ICD-10-CM

## 2015-07-14 DIAGNOSIS — K6389 Other specified diseases of intestine: Secondary | ICD-10-CM | POA: Insufficient documentation

## 2015-07-14 DIAGNOSIS — D62 Acute posthemorrhagic anemia: Secondary | ICD-10-CM | POA: Diagnosis present

## 2015-07-14 DIAGNOSIS — IMO0002 Reserved for concepts with insufficient information to code with codable children: Secondary | ICD-10-CM

## 2015-07-14 DIAGNOSIS — Z79899 Other long term (current) drug therapy: Secondary | ICD-10-CM | POA: Diagnosis not present

## 2015-07-14 DIAGNOSIS — Z515 Encounter for palliative care: Secondary | ICD-10-CM | POA: Insufficient documentation

## 2015-07-14 DIAGNOSIS — K55069 Acute infarction of intestine, part and extent unspecified: Secondary | ICD-10-CM | POA: Diagnosis present

## 2015-07-14 DIAGNOSIS — R197 Diarrhea, unspecified: Secondary | ICD-10-CM | POA: Diagnosis not present

## 2015-07-14 DIAGNOSIS — R188 Other ascites: Secondary | ICD-10-CM | POA: Diagnosis present

## 2015-07-14 DIAGNOSIS — K7460 Unspecified cirrhosis of liver: Secondary | ICD-10-CM

## 2015-07-14 DIAGNOSIS — K921 Melena: Secondary | ICD-10-CM | POA: Diagnosis present

## 2015-07-14 DIAGNOSIS — R1084 Generalized abdominal pain: Secondary | ICD-10-CM | POA: Diagnosis present

## 2015-07-14 DIAGNOSIS — D509 Iron deficiency anemia, unspecified: Secondary | ICD-10-CM | POA: Diagnosis not present

## 2015-07-14 DIAGNOSIS — R109 Unspecified abdominal pain: Secondary | ICD-10-CM | POA: Diagnosis not present

## 2015-07-14 HISTORY — DX: Acute infarction of intestine, part and extent unspecified: K55.069

## 2015-07-14 HISTORY — DX: Personal history of other infectious and parasitic diseases: Z86.19

## 2015-07-14 LAB — CBC
HCT: 24.3 % — ABNORMAL LOW (ref 36.0–46.0)
Hemoglobin: 7.4 g/dL — ABNORMAL LOW (ref 12.0–15.0)
MCH: 23 pg — AB (ref 26.0–34.0)
MCHC: 30.5 g/dL (ref 30.0–36.0)
MCV: 75.5 fL — AB (ref 78.0–100.0)
PLATELETS: 103 10*3/uL — AB (ref 150–400)
RBC: 3.22 MIL/uL — AB (ref 3.87–5.11)
RDW: 15.5 % (ref 11.5–15.5)
WBC: 5.2 10*3/uL (ref 4.0–10.5)

## 2015-07-14 LAB — CBC WITH DIFFERENTIAL/PLATELET
BASOS ABS: 0 10*3/uL (ref 0.0–0.1)
BASOS PCT: 0 %
EOS ABS: 0 10*3/uL (ref 0.0–0.7)
EOS PCT: 1 %
HCT: 23.6 % — ABNORMAL LOW (ref 36.0–46.0)
Hemoglobin: 7.2 g/dL — ABNORMAL LOW (ref 12.0–15.0)
LYMPHS PCT: 9 %
Lymphs Abs: 0.4 10*3/uL — ABNORMAL LOW (ref 0.7–4.0)
MCH: 22.9 pg — ABNORMAL LOW (ref 26.0–34.0)
MCHC: 30.5 g/dL (ref 30.0–36.0)
MCV: 74.9 fL — AB (ref 78.0–100.0)
MONO ABS: 0.5 10*3/uL (ref 0.1–1.0)
Monocytes Relative: 9 %
Neutro Abs: 4.1 10*3/uL (ref 1.7–7.7)
Neutrophils Relative %: 81 %
PLATELETS: 101 10*3/uL — AB (ref 150–400)
RBC: 3.15 MIL/uL — AB (ref 3.87–5.11)
RDW: 15.7 % — ABNORMAL HIGH (ref 11.5–15.5)
WBC: 5.1 10*3/uL (ref 4.0–10.5)

## 2015-07-14 LAB — PREPARE RBC (CROSSMATCH)

## 2015-07-14 LAB — PROTIME-INR
INR: 1.76 — ABNORMAL HIGH (ref 0.00–1.49)
PROTHROMBIN TIME: 20.5 s — AB (ref 11.6–15.2)

## 2015-07-14 LAB — COMPREHENSIVE METABOLIC PANEL
ALK PHOS: 65 U/L (ref 38–126)
ALT: 35 U/L (ref 14–54)
AST: 25 U/L (ref 15–41)
Albumin: 3.1 g/dL — ABNORMAL LOW (ref 3.5–5.0)
Anion gap: 6 (ref 5–15)
BUN: 18 mg/dL (ref 6–20)
CALCIUM: 8.6 mg/dL — AB (ref 8.9–10.3)
CHLORIDE: 98 mmol/L — AB (ref 101–111)
CO2: 26 mmol/L (ref 22–32)
CREATININE: 1.06 mg/dL — AB (ref 0.44–1.00)
GFR, EST NON AFRICAN AMERICAN: 59 mL/min — AB (ref >60)
Glucose, Bld: 296 mg/dL — ABNORMAL HIGH (ref 65–99)
Potassium: 3.6 mmol/L (ref 3.5–5.1)
SODIUM: 130 mmol/L — AB (ref 135–145)
Total Bilirubin: 1.3 mg/dL — ABNORMAL HIGH (ref 0.3–1.2)
Total Protein: 6.6 g/dL (ref 6.5–8.1)

## 2015-07-14 LAB — URINE MICROSCOPIC-ADD ON: RBC / HPF: NONE SEEN RBC/hpf (ref 0–5)

## 2015-07-14 LAB — URINALYSIS, ROUTINE W REFLEX MICROSCOPIC
BILIRUBIN URINE: NEGATIVE
GLUCOSE, UA: NEGATIVE mg/dL
HGB URINE DIPSTICK: NEGATIVE
KETONES UR: NEGATIVE mg/dL
Nitrite: NEGATIVE
PROTEIN: NEGATIVE mg/dL
Specific Gravity, Urine: 1.02 (ref 1.005–1.030)
pH: 5.5 (ref 5.0–8.0)

## 2015-07-14 LAB — AMMONIA: Ammonia: 45 umol/L — ABNORMAL HIGH (ref 9–35)

## 2015-07-14 LAB — GLUCOSE, CAPILLARY: Glucose-Capillary: 396 mg/dL — ABNORMAL HIGH (ref 65–99)

## 2015-07-14 MED ORDER — FAMOTIDINE IN NACL 20-0.9 MG/50ML-% IV SOLN: 20.0000 mg | Freq: Two times a day (BID) | INTRAVENOUS | Status: DC

## 2015-07-14 MED ORDER — NADOLOL 20 MG PO TABS
10.0000 mg | ORAL_TABLET | Freq: Every day | ORAL | Status: DC
  Filled 2015-07-14 (×3): qty 1

## 2015-07-14 MED ORDER — SODIUM CHLORIDE 0.9 % IV SOLN: Freq: Once | INTRAVENOUS | Status: DC

## 2015-07-14 MED ORDER — FUROSEMIDE 20 MG PO TABS
100.0000 mg | ORAL_TABLET | Freq: Every day | ORAL | Status: DC
  Administered 2015-07-15 – 2015-07-17 (×3): 100 mg via ORAL
  Filled 2015-07-14 (×3): qty 1

## 2015-07-14 MED ORDER — ONDANSETRON HCL 4 MG/2ML IJ SOLN: 4.0000 mg | Freq: Four times a day (QID) | INTRAMUSCULAR | Status: DC | PRN

## 2015-07-14 MED ORDER — SODIUM CHLORIDE 0.9 % IV BOLUS (SEPSIS)
500.0000 mL | Freq: Once | INTRAVENOUS | Status: AC
  Administered 2015-07-14: 500 mL via INTRAVENOUS

## 2015-07-14 MED ORDER — ADULT MULTIVITAMIN W/MINERALS CH
1.0000 | ORAL_TABLET | Freq: Every day | ORAL | Status: DC
  Administered 2015-07-15 – 2015-07-17 (×3): 1 via ORAL
  Filled 2015-07-14 (×3): qty 1

## 2015-07-14 MED ORDER — BACLOFEN 10 MG PO TABS: 10.0000 mg | ORAL_TABLET | Freq: Three times a day (TID) | ORAL | Status: DC | PRN

## 2015-07-14 MED ORDER — SODIUM CHLORIDE 0.9% FLUSH: 3.0000 mL | INTRAVENOUS | Status: DC | PRN

## 2015-07-14 MED ORDER — INSULIN ASPART 100 UNIT/ML ~~LOC~~ SOLN
0.0000 [IU] | SUBCUTANEOUS | Status: DC
  Administered 2015-07-15: 5 [IU] via SUBCUTANEOUS
  Administered 2015-07-15: 3 [IU] via SUBCUTANEOUS
  Administered 2015-07-15: 1 [IU] via SUBCUTANEOUS

## 2015-07-14 MED ORDER — POLYETHYLENE GLYCOL 3350 17 G PO PACK: 17.0000 g | PACK | Freq: Every day | ORAL | Status: DC | PRN

## 2015-07-14 MED ORDER — SODIUM CHLORIDE 0.9% FLUSH
3.0000 mL | Freq: Two times a day (BID) | INTRAVENOUS | Status: DC
  Administered 2015-07-15 – 2015-07-17 (×5): 3 mL via INTRAVENOUS

## 2015-07-14 MED ORDER — FAMOTIDINE IN NACL 20-0.9 MG/50ML-% IV SOLN
20.0000 mg | Freq: Two times a day (BID) | INTRAVENOUS | Status: DC
  Administered 2015-07-14 – 2015-07-16 (×4): 20 mg via INTRAVENOUS
  Filled 2015-07-14 (×7): qty 50

## 2015-07-14 MED ORDER — B COMPLEX-C PO TABS
1.0000 | ORAL_TABLET | Freq: Every day | ORAL | Status: DC
  Administered 2015-07-15 – 2015-07-17 (×3): 1 via ORAL
  Filled 2015-07-14 (×6): qty 1

## 2015-07-14 MED ORDER — FERROUS SULFATE 325 (65 FE) MG PO TABS
325.0000 mg | ORAL_TABLET | Freq: Two times a day (BID) | ORAL | Status: DC
  Administered 2015-07-15: 325 mg via ORAL
  Filled 2015-07-14: qty 1

## 2015-07-14 MED ORDER — TRAZODONE HCL 50 MG PO TABS
50.0000 mg | ORAL_TABLET | Freq: Every day | ORAL | Status: DC
  Administered 2015-07-14 – 2015-07-16 (×3): 50 mg via ORAL
  Filled 2015-07-14 (×3): qty 1

## 2015-07-14 MED ORDER — SODIUM CHLORIDE 0.9 % IV SOLN: 250.0000 mL | INTRAVENOUS | Status: DC | PRN

## 2015-07-14 MED ORDER — RIFAXIMIN 550 MG PO TABS
550.0000 mg | ORAL_TABLET | Freq: Two times a day (BID) | ORAL | Status: DC
  Administered 2015-07-14 – 2015-07-17 (×6): 550 mg via ORAL
  Filled 2015-07-14 (×5): qty 1

## 2015-07-14 MED ORDER — SPIRONOLACTONE 100 MG PO TABS
300.0000 mg | ORAL_TABLET | Freq: Every day | ORAL | Status: DC
  Administered 2015-07-15 – 2015-07-17 (×3): 300 mg via ORAL
  Filled 2015-07-14 (×3): qty 3

## 2015-07-14 MED ORDER — ONDANSETRON HCL 4 MG PO TABS: 4.0000 mg | ORAL_TABLET | Freq: Four times a day (QID) | ORAL | Status: DC | PRN

## 2015-07-14 MED ORDER — FAMOTIDINE IN NACL 20-0.9 MG/50ML-% IV SOLN
INTRAVENOUS | Status: AC
  Filled 2015-07-14: qty 50

## 2015-07-14 MED ORDER — INSULIN GLARGINE 100 UNIT/ML ~~LOC~~ SOLN
20.0000 [IU] | Freq: Every evening | SUBCUTANEOUS | Status: DC
  Administered 2015-07-14 – 2015-07-16 (×3): 20 [IU] via SUBCUTANEOUS
  Filled 2015-07-14 (×7): qty 0.2

## 2015-07-14 MED ORDER — ALBUTEROL SULFATE (2.5 MG/3ML) 0.083% IN NEBU: 3.0000 mL | INHALATION_SOLUTION | Freq: Four times a day (QID) | RESPIRATORY_TRACT | Status: DC | PRN

## 2015-07-14 NOTE — ED Notes (Addendum)
Pt reports was recently taken off liver transplant list due to three blood clots in liver. Pt reports dose of coumadin was recently changed on saturday. Pt reports abdominal pain,distention,diarrhea,fatigue for last five days.

## 2015-07-14 NOTE — H&P (Signed)
[facility] History and Physical  [name] [id number] DOB: 08/29/1960 DOA: 07/14/2015  Referring physician: Dr. Roderic Palau  PCP: Glo Herring., MD   Chief Complaint: Fatigue, black stool   HPI: [name] is a 55 y.o. female is a 55yo WF with hist of DM2, hep C and hepatic cirrhosis. She had advanced liver disease and was on the transplant list for 14 yrs but never got called.  She recently in April was diagnosed with SMV thrombosis and was taken off the transplant list.  She says that her doctors at [facility] has signed off and sent her back to her GI doctor in [place].  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  She has chron ascites but hasn't been drained for quite some time because she "takes a lot of diuretics'.  She presents today to ED with black stool x 3-4 days.  Last Friday she noted black odorous stools and it lasted for about 2 days, but now has resolved.  However she felt very weak , and her Hb here is low in the 7's.  Asked to see for admission.    Patient lives w her husband who is here.  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  Her father ran the business , he died in 05-27-2001.  Her brother runs it now, they don't talk to one another however.  Her mother is in an ALF in [place].  She has one grown son.  She and her husband live in [place].    No etoh, tobacco.  No abd pain, n/v/d.     ROS  denies CP  no joint pain   no HA  no blurry vision  no rash  no diarrhea  no nausea/ vomiting  no dysuria  no difficulty voiding  no change in urine color    Past Medical History  Past Medical History  Diagnosis Date  . Esophageal varices ([place])   . Cirrhosis ([place])     non alcoholic  . Ascites   . Anemia   . Liver failure ([place])     "I'm on liver transplant list @  [facility]" (11/07/2012)  . Portal vein thrombosis   . Complication of anesthesia   . PONV (postoperative nausea and vomiting)   . Heart murmur   . Type II diabetes mellitus ([place])     "not since gastric bypass" (11/07/2012)  . Hepatitis C     Hx: of Hep "C" it was eradicated  . [id number])     "monthly" (11/07/2012)  . Anxiety   . Depression   . Kidney stones    Past Surgical History  Past Surgical History  Procedure Laterality Date  . Abdominal hysterectomy  2001-05-27  . Upper gastrointestinal endoscopy    . Colonoscopy    . Gastric bypass  ~ 1993/05/27  . Cesarean section  05-27-1993  . Lithotripsy      "several times" (11/07/2012)  . Knee arthroscopy Right   . Colonoscopy  10/21/2011    Procedure: COLONOSCOPY;  Surgeon: Rogene Houston, MD;  Location: [facility];  Service: Endoscopy;  Laterality: N/A;  245   . Cystoscopy with stent placement  12/08/2011    Procedure: CYSTOSCOPY WITH STENT PLACEMENT;  Surgeon: Marissa Nestle, MD;  Location: [facility] ORS;  Service: Urology;  Laterality: Right;  . Cystoscopy w/ retrogrades  12/08/2011    Procedure: CYSTOSCOPY WITH RETROGRADE PYELOGRAM;  Surgeon: Marissa Nestle, MD;  Location: [facility] ORS;  Service: Urology;  Laterality: Right;  . Dilation and curettage of uterus    . Orif distal radius fracture Right 11/07/2012  . Cholecystectomy  1987  . Hernia repair  04/14/11    "one in my bellybutton" (11/07/2012)  . Inguinal hernia repair Right     "maybe 2" (11/07/2012)  . Open reduction internal fixation (orif) distal radial fracture Right 11/07/2012    Procedure: OPEN REDUCTION INTERNAL FIXATION (ORIF) RIGHT DISTAL RADIAL FRACTURE;  Surgeon: [name], MD;  Location: [facility];  Service: Orthopedics;  Laterality: Right;  . Esophagogastroduodenoscopy N/A 02/19/2014    Procedure: ESOPHAGOGASTRODUODENOSCOPY (EGD);  Surgeon: Rogene Houston, MD;  Location: [facility];  Service: Endoscopy;  Laterality: N/A;  100   Family History  Family History  Problem  Relation Age of Onset  . Dementia Mother   . Heart disease Father   . Liver disease Father   . Hypertension Father   . Diabetes Father   . Dementia Father   . Hypertension Brother   . Other Son     Cervical dystonia   Social History  reports that she quit smoking about 24 years ago. Her smoking use included Cigarettes. She has a 2.5 pack-year smoking history. She has never used smokeless tobacco. She reports that she does not drink alcohol or use illicit drugs. Allergies  Allergies  Allergen Reactions  . Ancef [Cefazolin Sodium] Other (See Comments)    "Blisters in mouth and vagina" Tolerates Penicillins  . Gadobenate Itching    Nausea and vomiting reported by patient post injection of multihance.  Pt states itching and throat redness after receiving MRI contrast.  . Iodinated Diagnostic Agents Rash    Patient reports itching, nausea and vomiting.  . Tape Rash    Blisters skin.   Home medications Prior to Admission medications   Medication Sig Start Date End Date Taking? Authorizing Provider  apixaban (ELIQUIS) 2.5 MG TABS tablet Take 1 tablet (2.5 mg total) by mouth 2 (two) times daily. 07/09/15  Yes Rogene Houston, MD  b complex vitamins tablet Take 1 tablet by mouth daily.   Yes Historical Provider, MD  baclofen (LIORESAL) 10 MG tablet Take 1 tablet (10 mg total) by mouth 3 (three) times daily as needed for muscle spasms (alternates with baclofen). 05/13/14  Yes Rogene Houston, MD  cyclobenzaprine (FLEXERIL) 10 MG tablet Take 1 tablet (10 mg total) by mouth 3 (three) times daily as needed for muscle spasms. As needed 08/15/14  Yes Rogene Houston, MD  ferrous sulfate 325 (65 FE) MG tablet Take 325 mg by mouth 2 (two) times daily with a meal.   Yes Historical Provider, MD  furosemide (LASIX) 40 MG tablet Take 100 mg by mouth daily.    Yes Historical Provider, MD  LANTUS SOLOSTAR 100 UNIT/ML Solostar Pen Inject 20 Units into the skin every evening.  08/27/14  Yes Historical Provider, MD   levofloxacin (LEVAQUIN) 500 MG tablet TAKE (1) TABLET BY MOUTH ONCE DAILY. 01/22/14  Yes Butch Penny, NP  Multiple Vitamin (MULTIVITAMIN WITH MINERALS) TABS tablet Take 1 tablet by mouth daily.   Yes Historical Provider, MD  nadolol (CORGARD) 20 MG tablet Take 10 mg by mouth daily.   Yes Historical Provider, MD  rifaximin (XIFAXAN) 550 MG TABS tablet Take 1 tablet (550 mg total)  by mouth 2 (two) times daily. 05/13/14  Yes Rogene Houston, MD  spironolactone (ALDACTONE) 100 MG tablet Take 300 mg by mouth daily.    Yes Historical Provider, MD  traZODone (DESYREL) 50 MG tablet Take 1 tablet (50 mg total) by mouth at bedtime. 08/15/14  Yes Rogene Houston, MD  VENTOLIN HFA 108 (90 Base) MCG/ACT inhaler Inhale 1-2 puffs into the lungs every 6 (six) hours as needed for wheezing.  05/01/15  Yes Historical Provider, MD  lactulose (CHRONULAC) 10 GM/15ML solution Take 45 mLs (30 g total) by mouth 3 (three) times daily. Patient not taking: Reported on 07/14/2015 06/03/13   Erline Hau, MD   Liver Function Tests  Recent Labs Lab 07/14/15 1439  AST 25  ALT 35  ALKPHOS 65  BILITOT 1.3*  PROT 6.6  ALBUMIN 3.1*   No results for input(s): LIPASE, AMYLASE in the last 168 hours. CBC  Recent Labs Lab 07/14/15 1439 07/14/15 1542  WBC 5.2 5.1  NEUTROABS  --  4.1  HGB 7.4* 7.2*  HCT 24.3* 23.6*  MCV 75.5* 74.9*  PLT 103* [id number]*   Basic Metabolic Panel  Recent Labs Lab 07/14/15 1439  NA 130*  K 3.6  CL 98*  CO2 26  GLUCOSE 296*  BUN 18  CREATININE 1.06*  CALCIUM 8.6*     Filed Vitals:   07/14/15 1600 07/14/15 1707 07/14/15 1708 07/14/15 1806  BP: 115/55 104/60    Pulse: 80  85   Temp:      TempSrc:      Resp: 15  12   Height:    5\' 6"  (1.676 m)  Weight:    68.493 kg (151 lb)  SpO2: 99%  100%    Exam: Gen alert pleasant middle-aged female, no distress, calm No rash, cyanosis or gangrene Sclera anicteric, throat clear  No jvd or bruits Chest clear bilat RRR no  MRG Abd soft ntnd no mass, diffuse 2-3+ ascites, not tense GU defer MS no joint effusions or deformity Ext no LE edema / no  wounds or ulcers Neuro is alert, Ox 3 , nf  Acute abc w chest xrays (independ reviewed) > Enlarged spleen. Moderate diffuse stool throughout the colon. Nobowel obstruction or free air. No parenchymal lung edema or consolidation.  Na 130  Creat 1.06  CO2 26  K 3.6  Ca 8.6  Alb 3.1  Tbili 1.3  NH3 45 WBC 5k  Hb 7.2  MCV 74  plt 101 INR 1.76 UA negative   Assessment: 1  Melena / GI bleed  - complicated in patient w known esoph varices taking Eliquis for SMV thrombosis.  Admit, dc Eliquis, transfuse prbc's, give IV PPI for now.  Will need GI consult in am. Cont diuretics.  Patient no longer Tx candidate with very poor QOL (pt's words).  She requests DNR status and would like to meet with palliative care, will order consult.   2  Cirrhosis/ hep C - cont nadolol, rifaximin, lasix/ aldact,  3  DM2 on insulin, cont lantus , added SSI 4  SMV thrombosis - hold Eliquis d/t bleed 5  DNR  Plan - as above     [place] D [facility] Pager [phone]  Cell [phone]  If 7PM-7AM, please contact night-coverage [email or web address] Password [email or web address] 07/14/2015, 7:45 PM

## 2015-07-14 NOTE — Telephone Encounter (Signed)
Patient has called the office and she states that she is so weak , she can hardly put one foot in front of the other. She has had diarrhea , just like pure water. She was up all night last night with the diarrhea. She has been in bed since Friday. Got up this morning to try and fix her something to eat , felt something running down her legs , it was diarrhea with no warning.  Addressed with Dr.Rehman. He states that the patient needs to go to the ED for evaluation. She may be dehydrated and we want to make sure that her Hemoglobin has not dropped.  Patient was called and made aware. She is having her friend drive her to ED.

## 2015-07-14 NOTE — ED Provider Notes (Signed)
CSN: [id number]     Arrival date & time 07/14/15  1345 History   First MD Initiated Contact with Patient 07/14/15 1505     Chief Complaint  Patient presents with  . Abdominal Pain     (Consider location/radiation/quality/duration/timing/severity/associated sxs/prior Treatment) Patient is a 55 y.o. female presenting with abdominal pain. The history is provided by the patient (Patient states that she had black diarrhea 4 days ago lasted for a few days she complains of weakness now and still has diarrhea but is not black).  Abdominal Pain Pain location:  Generalized (Mild tenderness throughout abdomen) Pain quality: aching   Pain radiates to:  Does not radiate Pain severity:  Mild Onset quality:  Sudden Timing:  Intermittent Progression:  Waxing and waning Chronicity:  New Context: not alcohol use   Associated symptoms: diarrhea   Associated symptoms: no chest pain, no cough, no fatigue and no hematuria     Past Medical History  Diagnosis Date  . Esophageal varices ([place])   . 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 Ascites   . Anemia   . Liver failure ([place])     "I'm on liver transplant list @ [facility]" (11/07/2012)  . Portal vein thrombosis   . Complication of anesthesia   . PONV (postoperative nausea and vomiting)   . Heart murmur   . Type II diabetes mellitus ([place])     "not since gastric bypass" (11/07/2012)  . Hepatitis C     Hx: of Hep "C" it was eradicated  . [id number]     "monthly" (11/07/2012)  . Anxiety   . Depression   . Kidney stones    Past Surgical History  Procedure Laterality Date  . Abdominal hysterectomy  2003  . Upper gastrointestinal endoscopy    . Colonoscopy    . Gastric bypass  ~ 1995  . Cesarean section  1995  . Lithotripsy      "several times" (11/07/2012)  . Knee arthroscopy Right   . Colonoscopy  10/21/2011    Procedure: COLONOSCOPY;  Surgeon: Rogene Houston, MD;  [facility];  Service: Endoscopy;  Laterality: N/A;  245   .  Cystoscopy with stent placement  12/08/2011    Procedure: CYSTOSCOPY WITH STENT PLACEMENT;  Surgeon: Marissa Nestle, MD;  Location: [facility];  Service: Urology;  Laterality: Right;  . Cystoscopy w/ retrogrades  12/08/2011    Procedure: CYSTOSCOPY WITH RETROGRADE PYELOGRAM;  Surgeon: Marissa Nestle, MD;  Location: [facility];  Service: Urology;  Laterality: Right;  . Dilation and curettage of uterus    . Orif distal radius fracture Right 11/07/2012  . Cholecystectomy  1987  . Hernia repair  04/14/11    "one in my bellybutton" (11/07/2012)  . Inguinal hernia repair Right     "maybe 2" (11/07/2012)  . Open reduction internal fixation (orif) distal radial fracture Right 11/07/2012    Procedure: OPEN REDUCTION INTERNAL FIXATION (ORIF) RIGHT DISTAL RADIAL FRACTURE;  Surgeon: [name], MD;  Location: [place];  Service: Orthopedics;  Laterality: Right;  . Esophagogastroduodenoscopy N/A 02/19/2014    Procedure: ESOPHAGOGASTRODUODENOSCOPY (EGD);  Surgeon: Rogene Houston, MD;  [facility];  Service: Endoscopy;  Laterality: N/A;  100   Family History  Problem Relation Age of Onset  . Dementia Mother   . Heart disease Father   . Liver disease Father   . Hypertension Father   . Diabetes Father   . Dementia Father   .  Hypertension Brother   . Other Son     Cervical dystonia   Social History  Substance Use Topics  . Smoking status: Former Smoker -- 0.50 packs/day for 5 years    Types: Cigarettes    Quit date: 05/02/1991  . Smokeless tobacco: Never Used  . Alcohol Use: No   OB History    No data available     Review of Systems  Constitutional: Negative for appetite change and fatigue.  HENT: Negative for congestion, ear discharge and sinus pressure.   Eyes: Negative for discharge.  Respiratory: Negative for cough.   Cardiovascular: Negative for chest pain.  Gastrointestinal: Positive for abdominal pain and diarrhea.  Genitourinary: Negative for frequency and hematuria.   Musculoskeletal: Negative for back pain.  Skin: Negative for rash.  Neurological: Negative for seizures and headaches.  Psychiatric/Behavioral: Negative for hallucinations.      Allergies  Ancef; Gadobenate; Iodinated diagnostic agents; and Tape  Home Medications   Prior to Admission medications   Medication Sig Start Date End Date Taking? Authorizing Provider  apixaban (ELIQUIS) 2.5 MG TABS tablet Take 1 tablet (2.5 mg total) by mouth 2 (two) times daily. 07/09/15  Yes Rogene Houston, MD  b complex vitamins tablet Take 1 tablet by mouth daily.   Yes Historical Provider, MD  baclofen (LIORESAL) 10 MG tablet Take 1 tablet (10 mg total) by mouth 3 (three) times daily as needed for muscle spasms (alternates with baclofen). 05/13/14  Yes Rogene Houston, MD  cyclobenzaprine (FLEXERIL) 10 MG tablet Take 1 tablet (10 mg total) by mouth 3 (three) times daily as needed for muscle spasms. As needed 08/15/14  Yes Rogene Houston, MD  ferrous sulfate 325 (65 FE) MG tablet Take 325 mg by mouth 2 (two) times daily with a meal.   Yes Historical Provider, MD  furosemide (LASIX) 40 MG tablet Take 100 mg by mouth daily.    Yes Historical Provider, MD  LANTUS SOLOSTAR 100 UNIT/ML Solostar Pen Inject 20 Units into the skin every evening.  08/27/14  Yes Historical Provider, MD  levofloxacin (LEVAQUIN) 500 MG tablet TAKE (1) TABLET BY MOUTH ONCE DAILY. 01/22/14  Yes Butch Penny, NP  Multiple Vitamin (MULTIVITAMIN WITH MINERALS) TABS tablet Take 1 tablet by mouth daily.   Yes Historical Provider, MD  nadolol (CORGARD) 20 MG tablet Take 10 mg by mouth daily.   Yes Historical Provider, MD  rifaximin (XIFAXAN) 550 MG TABS tablet Take 1 tablet (550 mg total) by mouth 2 (two) times daily. 05/13/14  Yes Rogene Houston, MD  spironolactone (ALDACTONE) 100 MG tablet Take 300 mg by mouth daily.    Yes Historical Provider, MD  traZODone (DESYREL) 50 MG tablet Take 1 tablet (50 mg total) by mouth at bedtime. 08/15/14  Yes  Rogene Houston, MD  VENTOLIN HFA 108 (90 Base) MCG/ACT inhaler Inhale 1-2 puffs into the lungs every 6 (six) hours as needed for wheezing.  05/01/15  Yes Historical Provider, MD  lactulose (CHRONULAC) 10 GM/15ML solution Take 45 mLs (30 g total) by mouth 3 (three) times daily. Patient not taking: Reported on 07/14/2015 06/03/13   Erline Hau, MD   BP 104/60 mmHg  Pulse 85  Temp(Src) 98.1 F (36.7 C) (Oral)  Resp 12  Ht 5\' 6"  (1.676 m)  Wt 150 lb (68.04 kg)  BMI 24.22 kg/m2  SpO2 100% Physical Exam  Constitutional: She is oriented to person, place, and time. She appears well-developed.  HENT:  Head:  Normocephalic.  Eyes: Conjunctivae and EOM are normal. No scleral icterus.  Neck: Neck supple. No thyromegaly present.  Cardiovascular: Normal rate and regular rhythm.  Exam reveals no gallop and no friction rub.   No murmur heard. Pulmonary/Chest: No stridor. She has no wheezes. She has no rales. She exhibits no tenderness.  Abdominal: She exhibits no distension. There is tenderness. There is no rebound.  Mild tenderness throughout  Genitourinary:  Rectal exam no stool in vault. Noticed to do a Hemoccult  Musculoskeletal: Normal range of motion. She exhibits no edema.  Lymphadenopathy:    She has no cervical adenopathy.  Neurological: She is oriented to person, place, and time. She exhibits normal muscle tone. Coordination normal.  Skin: No rash noted. No erythema.  Psychiatric: She has a normal mood and affect. Her behavior is normal.    ED Course  Procedures (including critical care time) Labs Review Labs Reviewed  COMPREHENSIVE METABOLIC PANEL - Abnormal; Notable for the following:    Sodium 130 (*)    Chloride 98 (*)    Glucose, Bld 296 (*)    Creatinine, Ser 1.06 (*)    Calcium 8.6 (*)    Albumin 3.1 (*)    Total Bilirubin 1.3 (*)    GFR calc non Af Amer 59 (*)    All other components within normal limits  CBC - Abnormal; Notable for the following:    RBC  3.22 (*)    Hemoglobin 7.4 (*)    HCT 24.3 (*)    MCV 75.5 (*)    MCH 23.0 (*)    Platelets 103 (*)    All other components within normal limits  URINALYSIS, ROUTINE W REFLEX MICROSCOPIC (NOT AT [facility]) - Abnormal; Notable for the following:    Leukocytes, UA TRACE (*)    All other components within normal limits  CBC WITH DIFFERENTIAL/PLATELET - Abnormal; Notable for the following:    RBC 3.15 (*)    Hemoglobin 7.2 (*)    HCT 23.6 (*)    MCV 74.9 (*)    MCH 22.9 (*)    RDW 15.7 (*)    Platelets 101 (*)    Lymphs Abs 0.4 (*)    All other components within normal limits  PROTIME-INR - Abnormal; Notable for the following:    Prothrombin Time 20.5 (*)    INR 1.76 (*)    All other components within normal limits  AMMONIA - Abnormal; Notable for the following:    Ammonia 45 (*)    All other components within normal limits  URINE MICROSCOPIC-ADD ON - Abnormal; Notable for the following:    Squamous Epithelial / LPF 0-5 (*)    Bacteria, UA RARE (*)    All other components within normal limits  TYPE AND SCREEN  PREPARE RBC (CROSSMATCH)    Imaging Review Dg Abd Acute W/chest  07/14/2015  CLINICAL DATA:  Abdominal pain with diarrhea. EXAM: DG ABDOMEN ACUTE W/ 1V CHEST COMPARISON:  CT abdomen and pelvis July 10, 2014 FINDINGS: PA chest: No edema or consolidation. Heart size and pulmonary vascularity are normal. No adenopathy. Supine and upright abdomen: There is moderate stool throughout the colon diffusely. There is no bowel dilatation or air-fluid level suggesting obstruction. No free air. There are multiple surgical clips in the abdomen and pelvis. There are phleboliths the pelvis. Spleen appears enlarged. IMPRESSION: Enlarged spleen. Moderate diffuse stool throughout the colon. No bowel obstruction or free air. No parenchymal lung edema or consolidation. Electronically Signed   By:  Lowella Grip III M.D.   On: 07/14/2015 16:30   I have personally reviewed and evaluated these images  and lab results as part of my medical decision-making.   EKG Interpretation None      MDM   Final diagnoses:  None    Patient with anemia and cirrhosis. Possible upper GI bleed. Patient will be transfused 2 units of blood.    Milton Ferguson, MD 07/14/15 1719

## 2015-07-15 ENCOUNTER — Encounter (HOSPITAL_COMMUNITY)
Admission: EM | Disposition: A | Discharge status: Home / Self Care | Payer: Self-pay | Source: Home / Self Care | Attending: Emergency Medicine

## 2015-07-15 ENCOUNTER — Encounter (HOSPITAL_COMMUNITY): Payer: Self-pay | Admitting: *Deleted

## 2015-07-15 DIAGNOSIS — B182 Chronic viral hepatitis C: Secondary | ICD-10-CM | POA: Diagnosis not present

## 2015-07-15 DIAGNOSIS — K7460 Unspecified cirrhosis of liver: Secondary | ICD-10-CM | POA: Diagnosis not present

## 2015-07-15 DIAGNOSIS — Z7189 Other specified counseling: Secondary | ICD-10-CM | POA: Diagnosis not present

## 2015-07-15 DIAGNOSIS — I81 Portal vein thrombosis: Secondary | ICD-10-CM | POA: Diagnosis not present

## 2015-07-15 DIAGNOSIS — K921 Melena: Secondary | ICD-10-CM

## 2015-07-15 DIAGNOSIS — D509 Iron deficiency anemia, unspecified: Secondary | ICD-10-CM

## 2015-07-15 DIAGNOSIS — Z8619 Personal history of other infectious and parasitic diseases: Secondary | ICD-10-CM | POA: Diagnosis not present

## 2015-07-15 DIAGNOSIS — K259 Gastric ulcer, unspecified as acute or chronic, without hemorrhage or perforation: Secondary | ICD-10-CM

## 2015-07-15 DIAGNOSIS — K6389 Other specified diseases of intestine: Secondary | ICD-10-CM | POA: Diagnosis not present

## 2015-07-15 DIAGNOSIS — Z515 Encounter for palliative care: Secondary | ICD-10-CM | POA: Insufficient documentation

## 2015-07-15 DIAGNOSIS — R188 Other ascites: Secondary | ICD-10-CM | POA: Diagnosis not present

## 2015-07-15 DIAGNOSIS — K922 Gastrointestinal hemorrhage, unspecified: Secondary | ICD-10-CM | POA: Diagnosis not present

## 2015-07-15 DIAGNOSIS — Z980 Intestinal bypass and anastomosis status: Secondary | ICD-10-CM | POA: Diagnosis not present

## 2015-07-15 DIAGNOSIS — I8500 Esophageal varices without bleeding: Secondary | ICD-10-CM | POA: Diagnosis not present

## 2015-07-15 HISTORY — PX: ESOPHAGOGASTRODUODENOSCOPY: SHX5428

## 2015-07-15 HISTORY — DX: Gastric ulcer, unspecified as acute or chronic, without hemorrhage or perforation: K25.9

## 2015-07-15 LAB — GLUCOSE, CAPILLARY
GLUCOSE-CAPILLARY: 127 mg/dL — AB (ref 65–99)
GLUCOSE-CAPILLARY: 134 mg/dL — AB (ref 65–99)
GLUCOSE-CAPILLARY: 161 mg/dL — AB (ref 65–99)
GLUCOSE-CAPILLARY: 155 mg/dL — AB (ref 65–99)
Glucose-Capillary: 201 mg/dL — ABNORMAL HIGH (ref 65–99)
Glucose-Capillary: 261 mg/dL — ABNORMAL HIGH (ref 65–99)
Glucose-Capillary: 297 mg/dL — ABNORMAL HIGH (ref 65–99)

## 2015-07-15 LAB — COMPREHENSIVE METABOLIC PANEL
ALBUMIN: 2.5 g/dL — AB (ref 3.5–5.0)
ALK PHOS: 57 U/L (ref 38–126)
ALT: 27 U/L (ref 14–54)
ANION GAP: 4 — AB (ref 5–15)
AST: 20 U/L (ref 15–41)
BUN: 18 mg/dL (ref 6–20)
CALCIUM: 7.6 mg/dL — AB (ref 8.9–10.3)
CHLORIDE: 102 mmol/L (ref 101–111)
CO2: 27 mmol/L (ref 22–32)
Creatinine, Ser: 0.97 mg/dL (ref 0.44–1.00)
GFR calc Af Amer: >60 mL/min (ref >60)
GFR calc non Af Amer: >60 mL/min (ref >60)
GLUCOSE: 114 mg/dL — AB (ref 65–99)
POTASSIUM: 3.2 mmol/L — AB (ref 3.5–5.1)
SODIUM: 133 mmol/L — AB (ref 135–145)
Total Bilirubin: 1.3 mg/dL — ABNORMAL HIGH (ref 0.3–1.2)
Total Protein: 5.5 g/dL — ABNORMAL LOW (ref 6.5–8.1)

## 2015-07-15 LAB — CBC
HCT: 25.3 % — ABNORMAL LOW (ref 36.0–46.0)
Hemoglobin: 8 g/dL — ABNORMAL LOW (ref 12.0–15.0)
MCH: 24.1 pg — ABNORMAL LOW (ref 26.0–34.0)
MCHC: 31.6 g/dL (ref 30.0–36.0)
MCV: 76.2 fL — ABNORMAL LOW (ref 78.0–100.0)
Platelets: 68 10*3/uL — ABNORMAL LOW (ref 150–400)
RBC: 3.32 MIL/uL — ABNORMAL LOW (ref 3.87–5.11)
RDW: 16.1 % — ABNORMAL HIGH (ref 11.5–15.5)
WBC: 2.8 10*3/uL — ABNORMAL LOW (ref 4.0–10.5)

## 2015-07-15 SURGERY — EGD (ESOPHAGOGASTRODUODENOSCOPY)
Anesthesia: Moderate Sedation

## 2015-07-15 MED ORDER — MIDAZOLAM HCL 5 MG/5ML IJ SOLN
INTRAMUSCULAR | Status: AC
  Filled 2015-07-15: qty 10

## 2015-07-15 MED ORDER — SUCRALFATE 1 GM/10ML PO SUSP
1.0000 g | Freq: Three times a day (TID) | ORAL | Status: DC
  Administered 2015-07-15 – 2015-07-17 (×8): 1 g via ORAL
  Filled 2015-07-15 (×9): qty 10

## 2015-07-15 MED ORDER — PANTOPRAZOLE SODIUM 40 MG PO TBEC
40.0000 mg | DELAYED_RELEASE_TABLET | Freq: Two times a day (BID) | ORAL | Status: DC
  Administered 2015-07-15 – 2015-07-17 (×4): 40 mg via ORAL
  Filled 2015-07-15 (×5): qty 1

## 2015-07-15 MED ORDER — CIPROFLOXACIN IN D5W 400 MG/200ML IV SOLN
400.0000 mg | Freq: Two times a day (BID) | INTRAVENOUS | Status: DC
  Administered 2015-07-15 – 2015-07-16 (×3): 400 mg via INTRAVENOUS
  Filled 2015-07-15 (×3): qty 200

## 2015-07-15 MED ORDER — INSULIN ASPART 100 UNIT/ML ~~LOC~~ SOLN
0.0000 [IU] | Freq: Three times a day (TID) | SUBCUTANEOUS | Status: DC
  Administered 2015-07-16: 5 [IU] via SUBCUTANEOUS
  Administered 2015-07-16: 2 [IU] via SUBCUTANEOUS
  Administered 2015-07-16: 3 [IU] via SUBCUTANEOUS
  Administered 2015-07-17: 1 [IU] via SUBCUTANEOUS
  Administered 2015-07-17: 3 [IU] via SUBCUTANEOUS

## 2015-07-15 MED ORDER — SODIUM CHLORIDE 0.9 % IV SOLN: INTRAVENOUS | Status: DC

## 2015-07-15 MED ORDER — STERILE WATER FOR IRRIGATION IR SOLN
Status: DC | PRN
  Administered 2015-07-15: 2.5 mL

## 2015-07-15 MED ORDER — MEPERIDINE HCL 50 MG/ML IJ SOLN
INTRAMUSCULAR | Status: DC | PRN
  Administered 2015-07-15: 25 mg via INTRAVENOUS

## 2015-07-15 MED ORDER — MEPERIDINE HCL 50 MG/ML IJ SOLN
INTRAMUSCULAR | Status: AC
  Filled 2015-07-15: qty 1

## 2015-07-15 MED ORDER — BUTAMBEN-TETRACAINE-BENZOCAINE 2-2-14 % EX AERO
INHALATION_SPRAY | CUTANEOUS | Status: DC | PRN
  Administered 2015-07-15: 2 via TOPICAL

## 2015-07-15 MED ORDER — MIDAZOLAM HCL 5 MG/5ML IJ SOLN
INTRAMUSCULAR | Status: DC | PRN
  Administered 2015-07-15 (×2): 1 mg via INTRAVENOUS
  Administered 2015-07-15: 2 mg via INTRAVENOUS
  Administered 2015-07-15: 1 mg via INTRAVENOUS

## 2015-07-15 NOTE — Consult Note (Signed)
Reason for Consult:  Referring Physician: Hospitalist  [name] is an 55 y.o. female.  HPI:  Admitted yesterday to to the ED yesterday. She has been sick for x 5 days. She was very dizzy. She had watery diarrhea.  She had multiple stools. There was no fever. She has had to wear Depends. She tells me her stools are black.  She has not had a BM since yesterday.  No acid reflux. She has received 3 units of PRBCs.  Hx of esophageal varices with banding in the past.   Her coumadin was switched to Eliquis at patient's request on Saturday.  Hx of End stage liver disease and Hepatitis C which was successfully treated. She is no longer on transplant list at  [facility]  for liver transplant because she remains with SMV thrombosis,  CBC Latest Ref Rng 07/15/2015 07/14/2015 07/14/2015  WBC 4.0 - 10.5 K/uL 2.8(L) 5.1 5.2  Hemoglobin 12.0 - 15.0 g/dL 8.0(L) 7.2(L) 7.4(L)  Hematocrit 36.0 - 46.0 % 25.3(L) 23.6(L) 24.3(L)  Platelets 150 - 400 K/uL 68(L) 101(L) 103(L)   Hepatic Function Latest Ref Rng 07/15/2015 07/14/2015 12/10/2013  Total Protein 6.5 - 8.1 g/dL 5.5(L) 6.6 6.3  Albumin 3.5 - 5.0 g/dL 2.5(L) 3.1(L) 3.6  AST 15 - 41 U/L _0 ALT 14 - 54 U/L 27 35 18  Alk Phosphatase 38 - 126 U/L 57 65 83  Total Bilirubin 0.3 - 1.2 mg/dL 1.3(H) 1.3(H) 1.0   _1  CBC    Component Value Date/Time   WBC 2.8* 07/15/2015 0601   RBC 3.32* 07/15/2015 0601   HGB 8.0* 07/15/2015 0601   HCT 25.3* 07/15/2015 0601   PLT 68* 07/15/2015 0601   MCV 76.2* 07/15/2015 0601   MCH 24.1* 07/15/2015 0601   MCHC 31.6 07/15/2015 0601   RDW 16.1* 07/15/2015 0601   LYMPHSABS 0.4* 07/14/2015 1542   MONOABS 0.5 07/14/2015 1542   EOSABS 0.0 07/14/2015 1542   BASOSABS 0.0 07/14/2015 1542       02/19/2014 EGD  Indications: Patient is 55 year old Caucasian female with advanced cirrhosis was listed for liver transplant. She has history of esophageal varices and has undergone banding for primary prophylaxis. Last  exam was over a year ago and she is due for one now.  Impression: Two columns of grade 1-II esophageal varices at mid esophagus not large enough to be banded. Focal scarring noted at distal esophagus from previous banding. Small gastric pouch with patent gastrojejunostomy. Portal gastropathy and jejunotomy.       Past Medical History  Diagnosis Date  . Esophageal varices ([place])   . 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 Ascites   . Anemia   . Liver failure ([place])     "I'm on liver transplant list @ [facility]" (11/07/2012)  . Portal vein thrombosis   . Complication of anesthesia   . PONV (postoperative nausea and vomiting)   . Heart murmur   . Type II diabetes mellitus ([place])     "not since gastric bypass" (11/07/2012)  . Hepatitis C     Hx: of Hep "C" it was eradicated  . ZGYFVCBS(496.7)     "monthly" (11/07/2012)  . Anxiety   . Depression   . Kidney stones     Past Surgical History  Procedure Laterality Date  . Abdominal hysterectomy  2003  . Upper gastrointestinal endoscopy    . Colonoscopy    . Gastric bypass  ~ 1995  . Cesarean  section  1995  . Lithotripsy      "several times" (11/07/2012)  . Knee arthroscopy Right   . Colonoscopy  10/21/2011    Procedure: COLONOSCOPY;  Surgeon: Rogene Houston, MD;  [facility];  Service: Endoscopy;  Laterality: N/A;  245   . Cystoscopy with stent placement  12/08/2011    Procedure: CYSTOSCOPY WITH STENT PLACEMENT;  Surgeon: Marissa Nestle, MD;  [facility];  Service: Urology;  Laterality: Right;  . Cystoscopy w/ retrogrades  12/08/2011    Procedure: CYSTOSCOPY WITH RETROGRADE PYELOGRAM;  Surgeon: Marissa Nestle, MD;  Location[facility];  Service: Urology;  Laterality: Right;  . Dilation and curettage of uterus    . Orif distal radius fracture Right 11/07/2012  . Cholecystectomy  1987  . Hernia repair   04/14/11    "one in my bellybutton" (11/07/2012)  . Inguinal hernia repair Right     "maybe 2" (11/07/2012)  . Open reduction internal fixation (orif) distal radial fracture Right 11/07/2012    Procedure: OPEN REDUCTION INTERNAL FIXATION (ORIF) RIGHT DISTAL RADIAL FRACTURE;  Surgeon: [name], MD;  Location: [place];  Service: Orthopedics;  Laterality: Right;  . Esophagogastroduodenoscopy N/A 02/19/2014    Procedure: ESOPHAGOGASTRODUODENOSCOPY (EGD);  Surgeon: Rogene Houston, MD;  [facility];  Service: Endoscopy;  Laterality: N/A;  100    Family History  Problem Relation Age of Onset  . Dementia Mother   . Heart disease Father   . Liver disease Father   . Hypertension Father   . Diabetes Father   . Dementia Father   . Hypertension Brother   . Other Son     Cervical dystonia    Social History:  reports that she quit smoking about 24 years ago. Her smoking use included Cigarettes. She has a 2.5 pack-year smoking history. She has never used smokeless tobacco. She reports that she does not drink alcohol or use illicit drugs.  Allergies:  Allergies  Allergen Reactions  . Ancef [Cefazolin Sodium] Other (See Comments)    "Blisters in mouth and vagina" Tolerates Penicillins  . Gadobenate Itching    Nausea and vomiting reported by patient post injection of multihance.  Pt states itching and throat redness after receiving MRI contrast.  . Iodinated Diagnostic Agents Rash    Patient reports itching, nausea and vomiting.  . Tape Rash    Blisters skin.    Medications: I have reviewed the patient's current medications.  Results for orders placed or performed during the hospital encounter of 07/14/15 (from the past 48 hour(s))  Comprehensive metabolic panel     Status: Abnormal   Collection Time: 07/14/15  2:39 PM  Result Value Ref Range   Sodium 130 (L) 135 - 145 mmol/L   Potassium 3.6 3.5 - 5.1 mmol/L   Chloride 98 (L) 101 - 111 mmol/L   CO2 26 22 - 32 mmol/L    Glucose, Bld 296 (H) 65 - 99 mg/dL   BUN 18 6 - 20 mg/dL   Creatinine, Ser 1.06 (H) 0.44 - 1.00 mg/dL   Calcium 8.6 (L) 8.9 - 10.3 mg/dL   Total Protein 6.6 6.5 - 8.1 g/dL   Albumin 3.1 (L) 3.5 - 5.0 g/dL   AST 25 15 - 41 U/L   ALT 35 14 - 54 U/L   Alkaline Phosphatase 65 38 - 126 U/L   Total Bilirubin 1.3 (H) 0.3 - 1.2 mg/dL   GFR calc non Af Amer 59 (L) >  60 mL/min   GFR calc Af Amer >60 >60 mL/min    Comment: (NOTE) The eGFR has been calculated using the CKD EPI equation. This calculation has not been validated in all clinical situations. eGFR's persistently <60 mL/min signify possible Chronic Kidney Disease.    Anion gap 6 5 - 15  CBC     Status: Abnormal   Collection Time: 07/14/15  2:39 PM  Result Value Ref Range   WBC 5.2 4.0 - 10.5 K/uL   RBC 3.22 (L) 3.87 - 5.11 MIL/uL   Hemoglobin 7.4 (L) 12.0 - 15.0 g/dL   HCT 24.3 (L) 36.0 - 46.0 %   MCV 75.5 (L) 78.0 - 100.0 fL   MCH 23.0 (L) 26.0 - 34.0 pg   MCHC 30.5 30.0 - 36.0 g/dL   RDW 15.5 11.5 - 15.5 %   Platelets 103 (L) 150 - 400 K/uL    Comment: SPECIMEN CHECKED FOR CLOTS PLATELET COUNT CONFIRMED BY SMEAR LARGE PLATELETS PRESENT   CBC with Differential/Platelet     Status: Abnormal   Collection Time: 07/14/15  3:42 PM  Result Value Ref Range   WBC 5.1 4.0 - 10.5 K/uL   RBC 3.15 (L) 3.87 - 5.11 MIL/uL   Hemoglobin 7.2 (L) 12.0 - 15.0 g/dL   HCT 23.6 (L) 36.0 - 46.0 %   MCV 74.9 (L) 78.0 - 100.0 fL   MCH 22.9 (L) 26.0 - 34.0 pg   MCHC 30.5 30.0 - 36.0 g/dL   RDW 15.7 (H) 11.5 - 15.5 %   Platelets 101 (L) 150 - 400 K/uL    Comment: SPECIMEN CHECKED FOR CLOTS LARGE PLATELETS PRESENT PLATELET COUNT CONFIRMED BY SMEAR    Neutrophils Relative % 81 %   Neutro Abs 4.1 1.7 - 7.7 K/uL   Lymphocytes Relative 9 %   Lymphs Abs 0.4 (L) 0.7 - 4.0 K/uL   Monocytes Relative 9 %   Monocytes Absolute 0.5 0.1 - 1.0 K/uL   Eosinophils Relative 1 %   Eosinophils Absolute 0.0 0.0 - 0.7 K/uL   Basophils Relative 0 %    Basophils Absolute 0.0 0.0 - 0.1 K/uL  Protime-INR     Status: Abnormal   Collection Time: 07/14/15  3:42 PM  Result Value Ref Range   Prothrombin Time 20.5 (H) 11.6 - 15.2 seconds   INR 1.76 (H) 0.00 - 1.49  Ammonia     Status: Abnormal   Collection Time: 07/14/15  3:42 PM  Result Value Ref Range   Ammonia 45 (H) 9 - 35 umol/L  Type and screen     Status: None (Preliminary result)   Collection Time: 07/14/15  3:42 PM  Result Value Ref Range   ABO/RH(D) B POS    Antibody Screen NEG    Sample Expiration 07/17/2015    Unit Number [id number]    Blood Component Type RED CELLS,LR    Unit division 00    Status of Unit REL FROM ALLOC    Transfusion Status OK TO TRANSFUSE    Crossmatch Result Compatible    Unit Number [id number]    Blood Component Type RED CELLS,LR    Unit division 00    Status of Unit REL FROM ALLOC    Transfusion Status OK TO TRANSFUSE    Crossmatch Result Compatible    Unit Number [id number]    Blood Component Type RED CELLS,LR    Unit division 00    Status of Unit ISSUED,FINAL    Transfusion Status OK TO TRANSFUSE      Crossmatch Result Compatible    Unit Number [id number]    Blood Component Type RED CELLS,LR    Unit division 00    Status of Unit ISSUED    Transfusion Status OK TO TRANSFUSE    Crossmatch Result Compatible   Prepare RBC     Status: None   Collection Time: 07/14/15  3:42 PM  Result Value Ref Range   Order Confirmation ORDER PROCESSED BY BLOOD BANK   Urinalysis, Routine w reflex microscopic     Status: Abnormal   Collection Time: 07/14/15  3:45 PM  Result Value Ref Range   Color, Urine YELLOW YELLOW   APPearance CLEAR CLEAR   Specific Gravity, Urine 1.020 1.005 - 1.030   pH 5.5 5.0 - 8.0   Glucose, UA NEGATIVE NEGATIVE mg/dL   Hgb urine dipstick NEGATIVE NEGATIVE   Bilirubin Urine NEGATIVE NEGATIVE   Ketones, ur NEGATIVE NEGATIVE mg/dL   Protein, ur NEGATIVE NEGATIVE mg/dL   Nitrite NEGATIVE NEGATIVE   Leukocytes, UA TRACE  (A) NEGATIVE  Urine microscopic-add on     Status: Abnormal   Collection Time: 07/14/15  3:45 PM  Result Value Ref Range   Squamous Epithelial / LPF 0-5 (A) NONE SEEN   WBC, UA 0-5 0 - 5 WBC/hpf   RBC / HPF NONE SEEN 0 - 5 RBC/hpf   Bacteria, UA RARE (A) NONE SEEN  Glucose, capillary     Status: Abnormal   Collection Time: 07/14/15  9:30 PM  Result Value Ref Range   Glucose-Capillary 396 (H) 65 - 99 mg/dL   Comment 1 Notify RN    Comment 2 Document in Chart   Glucose, capillary     Status: Abnormal   Collection Time: 07/15/15 12:40 AM  Result Value Ref Range   Glucose-Capillary 261 (H) 65 - 99 mg/dL   Comment 1 Notify RN    Comment 2 Document in Chart   Glucose, capillary     Status: Abnormal   Collection Time: 07/15/15  4:54 AM  Result Value Ref Range   Glucose-Capillary 127 (H) 65 - 99 mg/dL   Comment 1 Notify RN    Comment 2 Document in Chart   CBC     Status: Abnormal   Collection Time: 07/15/15  6:01 AM  Result Value Ref Range   WBC 2.8 (L) 4.0 - 10.5 K/uL   RBC 3.32 (L) 3.87 - 5.11 MIL/uL   Hemoglobin 8.0 (L) 12.0 - 15.0 g/dL   HCT 25.3 (L) 36.0 - 46.0 %   MCV 76.2 (L) 78.0 - 100.0 fL   MCH 24.1 (L) 26.0 - 34.0 pg   MCHC 31.6 30.0 - 36.0 g/dL   RDW 16.1 (H) 11.5 - 15.5 %   Platelets 68 (L) 150 - 400 K/uL    Comment: SPECIMEN CHECKED FOR CLOTS PLATELET COUNT CONFIRMED BY SMEAR   Comprehensive metabolic panel     Status: Abnormal   Collection Time: 07/15/15  6:01 AM  Result Value Ref Range   Sodium 133 (L) 135 - 145 mmol/L   Potassium 3.2 (L) 3.5 - 5.1 mmol/L   Chloride 102 101 - 111 mmol/L   CO2 27 22 - 32 mmol/L   Glucose, Bld 114 (H) 65 - 99 mg/dL   BUN 18 6 - 20 mg/dL   Creatinine, Ser 0.97 0.44 - 1.00 mg/dL   Calcium 7.6 (L) 8.9 - 10.3 mg/dL   Total Protein 5.5 (L) 6.5 - 8.1 g/dL   Albumin 2.5 (L) 3.5 - 5.0   g/dL   AST 20 15 - 41 U/L   ALT 27 14 - 54 U/L   Alkaline Phosphatase 57 38 - 126 U/L   Total Bilirubin 1.3 (H) 0.3 - 1.2 mg/dL   GFR calc non Af  Amer >60 >60 mL/min   GFR calc Af Amer >60 >60 mL/min    Comment: (NOTE) The eGFR has been calculated using the CKD EPI equation. This calculation has not been validated in all clinical situations. eGFR's persistently <60 mL/min signify possible Chronic Kidney Disease.    Anion gap 4 (L) 5 - 15  Glucose, capillary     Status: Abnormal   Collection Time: 07/15/15  7:16 AM  Result Value Ref Range   Glucose-Capillary 134 (H) 65 - 99 mg/dL   Comment 1 Notify RN    Comment 2 Document in Chart     Dg Abd Acute W/chest  07/14/2015  CLINICAL DATA:  Abdominal pain with diarrhea. EXAM: DG ABDOMEN ACUTE W/ 1V CHEST COMPARISON:  CT abdomen and pelvis July 10, 2014 FINDINGS: PA chest: No edema or consolidation. Heart size and pulmonary vascularity are normal. No adenopathy. Supine and upright abdomen: There is moderate stool throughout the colon diffusely. There is no bowel dilatation or air-fluid level suggesting obstruction. No free air. There are multiple surgical clips in the abdomen and pelvis. There are phleboliths the pelvis. Spleen appears enlarged. IMPRESSION: Enlarged spleen. Moderate diffuse stool throughout the colon. No bowel obstruction or free air. No parenchymal lung edema or consolidation. Electronically Signed   By: William  Woodruff III M.D.   On: 07/14/2015 16:30    Review of Systems  Cardiovascular: Positive for orthopnea.   Blood pressure 99/62, pulse 79, temperature 98.4 F (36.9 C), temperature source Oral, resp. rate 18, height 5' 6" (1.676 m), weight 151 lb (68.493 kg), SpO2 97 %. Physical Exam Alert and oriented. Skin warm and dry. Oral mucosa is moist.   . Sclera anicteric, conjunctivae is pink. Thyroid not enlarged. No cervical lymphadenopathy. Lungs clear. Heart regular rate and rhythm.  Abdomen is soft. Bowel sounds are positive. No hepatomegaly. No abdominal masses felt. Slight abdominal tenderness. Abdomen is large but soft.  No edema to lower extremities.     Assessment/Plan: Upper GI  Bleed. Possible variceal. Will discuss with Dr. Rehman.   SETZER,TERRI W 07/15/2015, 10:11 AM      

## 2015-07-15 NOTE — Op Note (Signed)
[facility] Patient Name: [name] Procedure Date: 07/15/2015 1:23 PM MRN: [id number] Date of Birth: Feb 19, 1960 Attending MD: Hildred Laser , MD CSN: [id number] Age: 55 Admit Type: Inpatient Procedure:                Upper GI endoscopy Indications:              Anemia due to suspected upper gastrointestinal                            bleeding, Melena. patient with known advanced                            cirrhosis. Providers:                Hildred Laser, MD, Lurline Del, RN, Randa Spike,                            Technician Referring MD:             Redmond School, MD Medicines:                Cetacaine spray, Meperidine 25 mg IV, Midazolam 5                            mg IV Complications:            No immediate complications. Estimated Blood Loss:     Estimated blood loss: none. Procedure:                Pre-Anesthesia Assessment:                           - Prior to the procedure, a History and Physical                            was performed, and patient medications and                            allergies were reviewed. The patient's tolerance of                            previous anesthesia was also reviewed. The risks                            and benefits of the procedure and the sedation                            options and risks were discussed with the patient.                            All questions were answered, and informed consent                            was obtained. Prior Anticoagulants: The patient                            last took Coumadin (  warfarin) 5 days prior to the                            procedure. ASA Grade Assessment: III - A patient                            with severe systemic disease. After reviewing the                            risks and benefits, the patient was deemed in                            satisfactory condition to undergo the procedure.                           After obtaining informed consent, the endoscope was                             passed under direct vision. Throughout the                            procedure, the patient's blood pressure, pulse, and                            oxygen saturations were monitored continuously. The                            [id number] scope was introduced through the                            mouth, and advanced to the proximal jejunum. The                            upper GI endoscopy was accomplished without                            difficulty. The patient tolerated the procedure                            well. Scope In: 1:59:50 PM Scope Out: 2:06:39 PM Total Procedure Duration: 0 hours 6 minutes 49 seconds  Findings:      Grade I varices were found in the middle third of the esophagus and in       the lower third of the esophagus.      The Z-line was regular and was found 35 cm from the incisors.      Evidence of a Roux-en-Y gastrojejunostomy was found. The       duodenum-to-jejunum limb was not examined as it could not be found.      One non-bleeding superficial gastric ulcer with no stigmata of bleeding       was found at the anastomosis. The lesion was 5 mm in largest dimension.      Diffuse mildly congested mucosa without active bleeding and with no       stigmata of bleeding was found in the jejunum.  Impression:               - Grade I esophageal varices.                           - Z-line regular, 35 cm from the incisors.                           - Roux-en-Y gastrojejunostomy with gastrojejunal                            anastomosis characterized by ulceration without                            stigmata of bleed.                           - Congested (edematous) jejunum.                           - No specimens collected. Moderate Sedation:      Moderate (conscious) sedation was administered by the endoscopy nurse       and supervised by the endoscopist. The following parameters were       monitored: oxygen saturation, heart rate,  blood pressure, CO2       capnography and response to care. Total physician intraservice time was       12 minutes. Recommendation:           - Return patient to hospital ward for ongoing care.                           - Clear liquid diet today.                           - Use Protonix (pantoprazole) 40 mg PO BID.                           - Use sucralfate suspension 1 gram PO QID.                           - Continue present medications. Procedure Code(s):        --- Professional ---                           [phone], Esophagogastroduodenoscopy, flexible,                            transoral; diagnostic, including collection of                            specimen(s) by brushing or washing, when performed                            (separate procedure)                           99152, Moderate sedation services provided by the  same physician or other qualified health care                            professional performing the diagnostic or                            therapeutic service that the sedation supports,                            requiring the presence of an independent trained                            observer to assist in the monitoring of the                            patient's level of consciousness and physiological                            status; initial 15 minutes of intraservice time,                            patient age 75 years or older Diagnosis Code(s):        --- Professional ---                           I85.00, Esophageal varices without bleeding                           Z98.0, Intestinal bypass and anastomosis status                           K25.9, Gastric ulcer, unspecified as acute or                            chronic, without hemorrhage or perforation                           K63.89, Other specified diseases of intestine                           D50.9, Iron deficiency anemia, unspecified                           K92.1,  Melena (includes Hematochezia) CPT copyright 2016 American Medical Association. All rights reserved. The codes documented in this report are preliminary and upon coder review may  be revised to meet current compliance requirements. Hildred Laser, MD Hildred Laser, MD 07/15/2015 2:26:35 PM This report has been signed electronically. Number of Addenda: 0

## 2015-07-15 NOTE — Progress Notes (Signed)
PROGRESS NOTE  [name] [id number] DOB: 04-17-1960 DOA: 07/14/2015 PCP: Glo Herring., MD  Brief Narrative: 55 year old woman with history of chronic hepatitis C status post treatment with advanced cirrhosis with multiple complications including SMV thrombosis, currently on anticoagulation, no longer a candidate for transplant, presented with black stools. Admitted for GI bleed.  Assessment/Plan: 1. GI bleed. Complicated by Eliquis. Known esophageal varices. EGD as below. GI recommends Protonix and sucralfate. 2. Advanced cirrhosis secondary to successfully treated chronic hepatitis C 12 years ago, complicated by persistent SMV thrombosis and h/o hepatic encephalopathy, ascites, esophageal varices, right pleural effusion, chronic anemia and thrombocytopenia. Recent change from warfarin >> Eliquis.  3. Diabetes mellitus type 2, stable.   Appreciate GI recommendations, continue PPI, hold Eliquis.  Continue  rifaximin, Lasix, Aldactone  Recheck her hemoglobin in the morning  Follow-up palliative care consultation   DVT prophylaxis: SCD Code Status: DNR  Family Communication: son Dorothea Ogle at bedside Disposition Plan: home  Murray Hodgkins, MD  [facility] Direct contact: [phone] --[address]  --[email or web address]; password [email or web address]  7PM-7AM contact night coverage as above 07/15/2015, 9:32 AM    Consultants:  Gastroenterology  Procedures:  EGD Impression: - Grade I esophageal varices.  - Z-line regular, 35 cm from the incisors.  - Roux-en-Y gastrojejunostomy with gastrojejunal   anastomosis characterized by ulceration without   stigmata of bleed.  - Congested (edematous) jejunum.  - No specimens collected  Antimicrobials:  Cipro 6/7 >>  HPI/Subjective: Feels okay at the present moment,  does have some abdominal pain left greater than right.  Objective: Filed Vitals:   07/15/15 0130 07/15/15 0430 07/15/15 0506 07/15/15 0900  BP: 98/42 98/50 94/53  99/62  Pulse: 75 78 80 79  Temp: 98 F (36.7 C) 98 F (36.7 C) 98.4 F (36.9 C)   TempSrc: Oral Oral Oral   Resp: 18 20 18    Height:      Weight:      SpO2:   97%     Intake/Output Summary (Last 24 hours) at 07/15/15 0932 Last data filed at 07/15/15 0915  Gross per 24 hour  Intake    638 ml  Output      0 ml  Net    638 ml     Filed Weights   07/14/15 1357 07/14/15 1806  Weight: 68.04 kg (150 lb) 68.493 kg (151 lb)    Exam:    Constitutional:  . Appears calm and comfortable Respiratory:  . CTA bilaterally, no w/r/r.  . Respiratory effort normal. No retractions or accessory muscle use Cardiovascular:  . RRR, no m/r/g . No LE extremity edema   Abdomen:  . Abdomen distended, nontender Psychiatric:  . judgement and insight appear normal . Mental status o Mood, affect appropriate  I have personally reviewed following labs and imaging studies:  Potassium 3.2, remainder CMP unremarkable.   WBC 2.8, Hemoglobin 8.0, platelet count 68   Scheduled Meds: . B-complex with vitamin C  1 tablet Oral Daily  . famotidine (PEPCID) IV  20 mg Intravenous Q12H  . ferrous sulfate  325 mg Oral BID WC  . furosemide  100 mg Oral Daily  . insulin aspart  0-9 Units Subcutaneous Q4H  . insulin glargine  20 Units Subcutaneous QPM  . multivitamin with minerals  1 tablet Oral Daily  . nadolol  10 mg Oral Daily  . rifaximin  550 mg Oral BID  . sodium chloride flush  3 mL Intravenous Q12H  . spironolactone  300 mg  Oral Daily  . traZODone  50 mg Oral QHS   Continuous Infusions:   Principal Problem:   Upper GI bleed Active Problems:   Anemia   Ascites   Superior mesenteric vein thrombosis (HCC)   Melena   Hepatic cirrhosis due to chronic hepatitis C infection ([place])   History of hepatitis C - successfuly treated  medically     Time spent 20 minutes   I personally performed the services described in this documentation. All medical record entries made by the scribe were at my direction. I have reviewed the chart and agree that the record reflects my personal performance and is accurate and complete. Murray Hodgkins, MD

## 2015-07-15 NOTE — Care Management Note (Signed)
Case Management Note  Patient Details  Name: [name] MRN: [id number] Date of Birth: [birth year]  Subjective/Objective:  Spoke with patient for discharge planning. Patient is from home with husband. Increased weakness over the past few weeks, Palliative consult placed by attending. Patietn stated has no DME at home.  Transportation available. No CM needs identified.                  Action/Plan: Continue to follow for discharge plan.   Expected Discharge Date:                  Expected Discharge Plan:  [place]  In-House Referral:     Discharge planning Services  CM Consult  Post Acute Care Choice:    Choice offered to:     DME Arranged:    DME Agency:     HH Arranged:    [place] Agency:     Status of Service:  In process, will continue to follow  Medicare Important Message Given:    Date Medicare IM Given:    Medicare IM give by:    Date Additional Medicare IM Given:    Additional Medicare Important Message give by:     If discussed at [place] of Stay Meetings, dates discussed:    Additional Comments:  Alvie Heidelberg, RN 07/15/2015, 9:21 AM

## 2015-07-15 NOTE — Consult Note (Signed)
Consultation Note Date: 07/15/2015   Patient Name: [name]  DOB: Jun 30, 1960  MRN: [id number]  Age / Sex: 55 y.o., female  PCP: Redmond School, MD Referring Physician: Samuella Cota, MD  Reason for Consultation: Disposition, Establishing goals of care and Psychosocial/spiritual support  HPI/Patient Profile: 55 y.o. female  with past medical history of Type II diabetes, hepatitis C, and hepatic cirrhosis admitted on 07/14/2015 with upper G.I. bleed.   Clinical Assessment and Goals of Care: [name] is resting quietly in bed, she greets me, making eye contact as I enter, her son [name], age 64, is at bedside.  We talk about her current health concerns, including the treatment plan of EGD today, and possible paracentesis tomorrow. We discussed 24 to 48 hours as a timeframe to make a good plan for her.  Somewhat limited interview today as [name] is still, per her, "loopy" from the medications given for her endoscopy.  Healthcare power of attorney NEXT OF KIN, [name] is able to make her own decisions at this time. She states that she would like her son [name], aged 31, to be her healthcare power of attorney.  Will request chaplain Joya Gaskins to assist family with this paperwork.   SUMMARY OF RECOMMENDATIONS   24 to 48 hours to make a good plan for [name], we talk about treating the treatable, she is open to home with hospice when the time is right, she states she would not want to be in the hospice home.  Code Status/Advance Care Planning:  DNR, we discussed the concepts of CPR, chest compressions, intubation. [name] states she would not want to have extraordinary measures to keep her alive, but she would want a peg tube to feed her if necessary. I ask her son [name] if he could abide these wishes. [name] states that he wants everything done for his mother; we have detailed discussion  regarding advanced directives and healthcare power of attorney. [name] states he is able to follow his mother's directive.   Symptom Management:   per hospitalist and Dr. Laural Golden. [name] states she would like something, "to take the edge off". We talk about these types of medication staying in the system for longer due to her advanced liver disease.  Palliative Prophylaxis:   Bowel Regimen, Frequent Pain Assessment and Turn Reposition  Additional Recommendations (Limitations, Scope, Preferences):  Continue to treat the treatable, paracentesis as needed, no extraordinary measures.  Psycho-social/Spiritual:   Desire for further Chaplaincy support:yes  Additional Recommendations: Caregiving  Support/Resources, Education on Hospice and Grief/Bereavement Support  Prognosis:   < 6 months, likely based on chronic disease burden related to end stage liver disease, functional decline.  Discharge Planning: To Be Determined      Primary Diagnoses: Present on Admission:  . Anemia . Ascites . Superior mesenteric vein thrombosis ([place]) . Melena . Upper GI bleed . (Resolved) Chronic hepatitis C ([place]) . Hepatic cirrhosis due to chronic hepatitis C infection ([place]) . History of hepatitis C - successfuly  treated medically  I have reviewed the medical record, interviewed the patient and family, and examined the patient. The following aspects are pertinent.  Past Medical History  Diagnosis Date  . Esophageal varices ([place])   . 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 Ascites   . Anemia   . Liver failure ([place])     "I'm on liver transplant list @ [facility]" (11/07/2012)  . Portal vein thrombosis   . Complication of anesthesia   . PONV (postoperative nausea and vomiting)   . Heart murmur   . Type II diabetes mellitus ([place])     "not since gastric bypass" (11/07/2012)  . Hepatitis C     Hx: of Hep "C" it was eradicated  . [id number])     "monthly" (11/07/2012)  . Anxiety   . Depression     . Kidney stones    Social History   Social History  . Marital Status: Divorced    Spouse Name: N/A  . Number of Children: 1  . Years of Education: 88   Social History Main Topics  . Smoking status: Former Smoker -- 0.50 packs/day for 5 years    Types: Cigarettes    Quit date: 05/02/1991  . Smokeless tobacco: Never Used  . Alcohol Use: No  . Drug Use: No  . Sexual Activity: Not Currently   Other Topics Concern  . None   Social History Narrative   Patient is single and lives at home and her son lives with her.   Patient is disabled.   Patient has a high school education.   Patient is left-handed.   Patient drinks 24 oz of tea/soda daily.   Family History  Problem Relation Age of Onset  . Dementia Mother   . Heart disease Father   . Liver disease Father   . Hypertension Father   . Diabetes Father   . Dementia Father   . Hypertension Brother   . Other Son     Cervical dystonia   Scheduled Meds: . [MAR Hold] B-complex with vitamin C  1 tablet Oral Daily  . ciprofloxacin  400 mg Intravenous Q12H  . [MAR Hold] famotidine (PEPCID) IV  20 mg Intravenous Q12H  . [MAR Hold] furosemide  100 mg Oral Daily  . [MAR Hold] insulin aspart  0-9 Units Subcutaneous Q4H  . [MAR Hold] insulin glargine  20 Units Subcutaneous QPM  . meperidine      . midazolam      . [MAR Hold] multivitamin with minerals  1 tablet Oral Daily  . pantoprazole  40 mg Oral BID AC  . [MAR Hold] rifaximin  550 mg Oral BID  . [MAR Hold] sodium chloride flush  3 mL Intravenous Q12H  . [MAR Hold] spironolactone  300 mg Oral Daily  . sucralfate  1 g Oral TID WC & HS  . [MAR Hold] traZODone  50 mg Oral QHS   Continuous Infusions:  PRN Meds:.[MAR Hold] sodium chloride, [MAR Hold] albuterol, [MAR Hold] baclofen, butamben-tetracaine-benzocaine, meperidine, midazolam, [MAR Hold] ondansetron **OR** [MAR Hold] ondansetron (ZOFRAN) IV, [MAR Hold] polyethylene glycol, [MAR Hold] sodium chloride flush Medications  Prior to Admission:  Prior to Admission medications   Medication Sig Start Date End Date Taking? Authorizing Provider  apixaban (ELIQUIS) 2.5 MG TABS tablet Take 1 tablet (2.5 mg total) by mouth 2 (two) times daily. 07/09/15  Yes Rogene Houston, MD  b complex vitamins tablet Take 1 tablet by mouth daily.   Yes Historical  Provider, MD  baclofen (LIORESAL) 10 MG tablet Take 1 tablet (10 mg total) by mouth 3 (three) times daily as needed for muscle spasms (alternates with baclofen). 05/13/14  Yes Rogene Houston, MD  cyclobenzaprine (FLEXERIL) 10 MG tablet Take 1 tablet (10 mg total) by mouth 3 (three) times daily as needed for muscle spasms. As needed 08/15/14  Yes Rogene Houston, MD  ferrous sulfate 325 (65 FE) MG tablet Take 325 mg by mouth 2 (two) times daily with a meal.   Yes Historical Provider, MD  furosemide (LASIX) 40 MG tablet Take 100 mg by mouth daily.    Yes Historical Provider, MD  LANTUS SOLOSTAR 100 UNIT/ML Solostar Pen Inject 20 Units into the skin every evening.  08/27/14  Yes Historical Provider, MD  levofloxacin (LEVAQUIN) 500 MG tablet TAKE (1) TABLET BY MOUTH ONCE DAILY. 01/22/14  Yes Butch Penny, NP  Multiple Vitamin (MULTIVITAMIN WITH MINERALS) TABS tablet Take 1 tablet by mouth daily.   Yes Historical Provider, MD  nadolol (CORGARD) 20 MG tablet Take 10 mg by mouth daily.   Yes Historical Provider, MD  rifaximin (XIFAXAN) 550 MG TABS tablet Take 1 tablet (550 mg total) by mouth 2 (two) times daily. 05/13/14  Yes Rogene Houston, MD  spironolactone (ALDACTONE) 100 MG tablet Take 300 mg by mouth daily.    Yes Historical Provider, MD  traZODone (DESYREL) 50 MG tablet Take 1 tablet (50 mg total) by mouth at bedtime. 08/15/14  Yes Rogene Houston, MD  VENTOLIN HFA 108 (90 Base) MCG/ACT inhaler Inhale 1-2 puffs into the lungs every 6 (six) hours as needed for wheezing.  05/01/15  Yes Historical Provider, MD  lactulose (CHRONULAC) 10 GM/15ML solution Take 45 mLs (30 g total) by mouth 3  (three) times daily. Patient not taking: Reported on 07/14/2015 06/03/13   Erline Hau, MD   Allergies  Allergen Reactions  . Ancef [Cefazolin Sodium] Other (See Comments)    "Blisters in mouth and vagina" Tolerates Penicillins  . Gadobenate Itching    Nausea and vomiting reported by patient post injection of multihance.  Pt states itching and throat redness after receiving MRI contrast.  . Iodinated Diagnostic Agents Rash    Patient reports itching, nausea and vomiting.  . Tape Rash    Blisters skin.   Review of Systems  Unable to perform ROS: Other    Physical Exam  Constitutional: She is oriented to person, place, and time. No distress.  Chronically ill appearing  HENT:  Head: Normocephalic and atraumatic.  Cardiovascular: Normal rate and regular rhythm.   Pulmonary/Chest: Effort normal. No respiratory distress.  Abdominal: She exhibits distension. There is no guarding.  Markedly distended abdomen  Neurological: She is alert and oriented to person, place, and time.  Somewhat lethargic status post EGD  Skin: Skin is warm and dry.  Nursing note and vitals reviewed.   Vital Signs: BP 91/57 mmHg  Pulse 82  Temp(Src) 98.4 F (36.9 C) (Oral)  Resp 14  Ht 5\' 6"  (1.676 m)  Wt 68.493 kg (151 lb)  BMI 24.38 kg/m2  SpO2 98% Pain Assessment: No/denies pain   Pain Score: 0-No pain   SpO2: SpO2: 98 % O2 Device:SpO2: 98 % O2 Flow Rate: .   IO: Intake/output summary:  Intake/Output Summary (Last 24 hours) at 07/15/15 1426 Last data filed at 07/15/15 1200  Gross per 24 hour  Intake    638 ml  Output      0 ml  Net    638 ml    LBM: Last BM Date: 07/14/15 Baseline Weight: Weight: 68.04 kg (150 lb) Most recent weight: Weight: 68.493 kg (151 lb)     Palliative Assessment/Data:   Flowsheet Rows        Most Recent Value   Intake Tab    Referral Department  Hospitalist   Unit at Time of Referral  Med/Surg Unit   Palliative Care Primary Diagnosis  Other  (Comment) [Hep C with hepatic cirrhosis]   Date Notified  07/14/15   Palliative Care Type  New Palliative care   Reason for referral  End of Life Care Assistance, Psychosocial or Spiritual support   Date of Admission  07/15/15   Date first seen by Palliative Care  07/15/15   # of days Palliative referral response time  1 Day(s)   # of days IP prior to Palliative referral  -1   Clinical Assessment    Psychosocial & Spiritual Assessment    Palliative Care Outcomes       Time In: 1450 Time Out: 1600 Time Total: 70 minutes Greater than 50%  of this time was spent counseling and coordinating care related to the above assessment and plan.  Signed by: Drue Novel, NP   Please contact Palliative Medicine Team phone at [phone] for questions and concerns.  For individual provider: See Shea Evans

## 2015-07-15 NOTE — Care Management Obs Status (Signed)
[place] NOTIFICATION   Patient Details  Name: [name] MRN: [id number] Date of Birth: August 10, 1960   Medicare Observation Status Notification Given:  Yes    Alvie Heidelberg, RN 07/15/2015, 9:24 AM

## 2015-07-15 NOTE — Progress Notes (Signed)
[place] Patient's viital signs monitored every 5 minutes. All vitals from 1354 -1425 stable. Monitored by Dr. Laural Golden and Vonda Antigua. Patient tolerated procedure well. Carried back to room 302, in bed with alarm on. Gave report to [employer]. Per Dr. Laural Golden, patient may have clear liquids at 1530, if numbing has worn off.

## 2015-07-16 DIAGNOSIS — Z7189 Other specified counseling: Secondary | ICD-10-CM | POA: Diagnosis not present

## 2015-07-16 DIAGNOSIS — Z515 Encounter for palliative care: Secondary | ICD-10-CM | POA: Diagnosis not present

## 2015-07-16 DIAGNOSIS — R188 Other ascites: Secondary | ICD-10-CM | POA: Diagnosis not present

## 2015-07-16 DIAGNOSIS — D649 Anemia, unspecified: Secondary | ICD-10-CM

## 2015-07-16 DIAGNOSIS — I81 Portal vein thrombosis: Secondary | ICD-10-CM | POA: Diagnosis not present

## 2015-07-16 DIAGNOSIS — K7460 Unspecified cirrhosis of liver: Secondary | ICD-10-CM | POA: Diagnosis not present

## 2015-07-16 DIAGNOSIS — K922 Gastrointestinal hemorrhage, unspecified: Secondary | ICD-10-CM

## 2015-07-16 DIAGNOSIS — D509 Iron deficiency anemia, unspecified: Secondary | ICD-10-CM | POA: Diagnosis not present

## 2015-07-16 DIAGNOSIS — B182 Chronic viral hepatitis C: Secondary | ICD-10-CM | POA: Diagnosis not present

## 2015-07-16 DIAGNOSIS — K7031 Alcoholic cirrhosis of liver with ascites: Secondary | ICD-10-CM

## 2015-07-16 LAB — GLUCOSE, CAPILLARY
GLUCOSE-CAPILLARY: 262 mg/dL — AB (ref 65–99)
GLUCOSE-CAPILLARY: 225 mg/dL — AB (ref 65–99)
Glucose-Capillary: 189 mg/dL — ABNORMAL HIGH (ref 65–99)

## 2015-07-16 LAB — CBC
HCT: 27.7 % — ABNORMAL LOW (ref 36.0–46.0)
Hemoglobin: 8.9 g/dL — ABNORMAL LOW (ref 12.0–15.0)
MCH: 24.6 pg — ABNORMAL LOW (ref 26.0–34.0)
MCHC: 32.1 g/dL (ref 30.0–36.0)
MCV: 76.5 fL — AB (ref 78.0–100.0)
Platelets: 72 10*3/uL — ABNORMAL LOW (ref 150–400)
RBC: 3.62 MIL/uL — AB (ref 3.87–5.11)
RDW: 16.1 % — AB (ref 11.5–15.5)
WBC: 4.4 10*3/uL (ref 4.0–10.5)

## 2015-07-16 MED ORDER — CIPROFLOXACIN HCL 250 MG PO TABS
500.0000 mg | ORAL_TABLET | Freq: Two times a day (BID) | ORAL | Status: DC
  Administered 2015-07-16 – 2015-07-17 (×2): 500 mg via ORAL
  Filled 2015-07-16 (×2): qty 2

## 2015-07-16 MED ORDER — FAMOTIDINE 20 MG PO TABS
20.0000 mg | ORAL_TABLET | Freq: Two times a day (BID) | ORAL | Status: DC
  Administered 2015-07-16 – 2015-07-17 (×2): 20 mg via ORAL
  Filled 2015-07-16 (×2): qty 1

## 2015-07-16 NOTE — Discharge Summary (Addendum)
Physician Discharge Summary  [name] [id number] DOB: 09-05-1960 DOA: 07/14/2015  PCP: Glo Herring., MD  Admit date: 07/14/2015 Discharge date: 07/17/2015  Recommendations for Outpatient Follow-up:  1. Dr. Laural Golden will arrange follow-up with his office. 2. Eliquis temporarily on hold. 3. Could consider oral antidepressant, defer to primary care physician.  Discharge Diagnoses:   GI bleed.   Advanced cirrhosis.  Diabetes mellitus type 2.  Discharge Condition: Improved  Disposition: Discharge home  Diet recommendation: Heart healthy  Filed Weights   07/14/15 1357 07/14/15 1806  Weight: 68.04 kg (150 lb) 68.493 kg (151 lb)    History of present illness:  55 year old woman with history of chronic hepatitis C status post treatment with advanced cirrhosis with multiple complications including SMV thrombosis, currently on anticoagulation, no longer a candidate for transplant, presented with black stools. Admitted for GI bleed.  Hospital Course:  Patient had no further bleeding. Hemoglobin stabilized with transfusion of packed red blood cells. Seen by gastroenterology, underwent EGD which did not reveal source of bleed. GI recommended PPI therapy and Carafate and given clinical stability, discharge home with outpatient follow-up. Patient to hold liquids Eliquis for 1 week and then restart.  Individual issues as below:  1. GI bleed complicated by Eliquis. No recurrent bleeding. EGD unrevealing.  2. Advanced cirrhosis secondary to successfully treated chronic hepatitis C 12 years ago, complicated by persistent SMV thrombosis and h/o hepatic encephalopathy, ascites, esophageal varices, right pleural effusion, chronic anemia and thrombocytopenia. Stable. No paracentesis per GI. 3. Diabetes mellitus type 2, stable.  Consultants:  Gastroenterology  PMT  Procedures:  EGD Impression: - Grade I esophageal varices.  - Z-line regular, 35  cm from the incisors.  - Roux-en-Y gastrojejunostomy with gastrojejunal   anastomosis characterized by ulceration without   stigmata of bleed.  - Congested (edematous) jejunum.  - No specimens collected  Transfused 1 unit PRBC 6/6  Transfused 1 unit PRBC 6/9  Antimicrobials:  Cipro 6/7 >>  Discharge Instructions  Discharge Instructions    Diet - low sodium heart healthy    Complete by:  As directed      Diet Carb Modified    Complete by:  As directed      Discharge instructions    Complete by:  As directed   Call your physician or seek immediate medical attention for pain, fever, swelling, bleeding or worsening of condition. May restart Eliquis in 1 week.     Increase activity slowly    Complete by:  As directed           Discharge Medication List as of 07/17/2015  3:50 PM    START taking these medications   Details  pantoprazole (PROTONIX) 40 MG tablet Take 1 tablet (40 mg total) by mouth 2 (two) times daily before a meal., Starting 07/17/2015, Until Discontinued, Normal    sucralfate (CARAFATE) 1 GM/10ML suspension Take 10 mLs (1 g total) by mouth 4 (four) times daily -  with meals and at bedtime., Starting 07/17/2015, Until Discontinued, Normal      CONTINUE these medications which have CHANGED   Details  levofloxacin (LEVAQUIN) 500 MG tablet TAKE (1) TABLET BY MOUTH ONCE DAILY., Normal      CONTINUE these medications which have NOT CHANGED   Details  b complex vitamins tablet Take 1 tablet by mouth daily., Until Discontinued, Historical Med    baclofen (LIORESAL) 10 MG tablet Take 1 tablet (10 mg total) by mouth 3 (three) times daily as needed for muscle spasms (  alternates with baclofen)., Starting 05/13/2014, Until Discontinued, Normal    cyclobenzaprine (FLEXERIL) 10 MG tablet Take 1 tablet (10 mg total) by mouth 3 (three) times daily as  needed for muscle spasms. As needed, Starting 08/15/2014, Until Discontinued, Normal    ferrous sulfate 325 (65 FE) MG tablet Take 325 mg by mouth 2 (two) times daily with a meal., Until Discontinued, Historical Med    furosemide (LASIX) 40 MG tablet Take 100 mg by mouth daily. , Until Discontinued, Historical Med    LANTUS SOLOSTAR 100 UNIT/ML Solostar Pen Inject 20 Units into the skin every evening. , Starting 08/27/2014, Until Discontinued, Historical Med    Multiple Vitamin (MULTIVITAMIN WITH MINERALS) TABS tablet Take 1 tablet by mouth daily., Until Discontinued, Historical Med    rifaximin (XIFAXAN) 550 MG TABS tablet Take 1 tablet (550 mg total) by mouth 2 (two) times daily., Starting 05/13/2014, Until Discontinued, Normal    spironolactone (ALDACTONE) 100 MG tablet Take 300 mg by mouth daily. , Until Discontinued, Historical Med    traZODone (DESYREL) 50 MG tablet Take 1 tablet (50 mg total) by mouth at bedtime., Starting 08/15/2014, Until Discontinued, Normal    VENTOLIN HFA 108 (90 Base) MCG/ACT inhaler Inhale 1-2 puffs into the lungs every 6 (six) hours as needed for wheezing. , Starting 05/01/2015, Until Discontinued, Historical Med    lactulose (CHRONULAC) 10 GM/15ML solution Take 45 mLs (30 g total) by mouth 3 (three) times daily., Starting 06/03/2013, Until Discontinued, Print      STOP taking these medications     apixaban (ELIQUIS) 2.5 MG TABS tablet      nadolol (CORGARD) 20 MG tablet        Allergies  Allergen Reactions  . Ancef [Cefazolin Sodium] Other (See Comments)    "Blisters in mouth and vagina" Tolerates Penicillins  . Gadobenate Itching    Nausea and vomiting reported by patient post injection of multihance.  Pt states itching and throat redness after receiving MRI contrast.  . Iodinated Diagnostic Agents Rash    Patient reports itching, nausea and vomiting.  . Tape Rash    Blisters skin.    The results of significant diagnostics from this hospitalization  (including imaging, microbiology, ancillary and laboratory) are listed below for reference.    Significant Diagnostic Studies: Dg Abd Acute W/chest  07/14/2015  CLINICAL DATA:  Abdominal pain with diarrhea. EXAM: DG ABDOMEN ACUTE W/ 1V CHEST COMPARISON:  CT abdomen and pelvis July 10, 2014 FINDINGS: PA chest: No edema or consolidation. Heart size and pulmonary vascularity are normal. No adenopathy. Supine and upright abdomen: There is moderate stool throughout the colon diffusely. There is no bowel dilatation or air-fluid level suggesting obstruction. No free air. There are multiple surgical clips in the abdomen and pelvis. There are phleboliths the pelvis. Spleen appears enlarged. IMPRESSION: Enlarged spleen. Moderate diffuse stool throughout the colon. No bowel obstruction or free air. No parenchymal lung edema or consolidation. Electronically Signed   By: Lowella Grip III M.D.   On: 07/14/2015 16:30   Labs: Basic Metabolic Panel:  Recent Labs Lab 07/14/15 1439 07/15/15 0601  NA 130* 133*  K 3.6 3.2*  CL 98* 102  CO2 26 27  GLUCOSE 296* 114*  BUN 18 18  CREATININE 1.06* 0.97  CALCIUM 8.6* 7.6*   Liver Function Tests:  Recent Labs Lab 07/14/15 1439 07/15/15 0601  AST 25 20  ALT 35 27  ALKPHOS 65 57  BILITOT 1.3* 1.3*  PROT 6.6 5.5*  ALBUMIN 3.1*  2.5*    Recent Labs Lab 07/14/15 1542  AMMONIA 45*   CBC:  Recent Labs Lab 07/14/15 1439 07/14/15 1542 07/15/15 0601 07/16/15 0854 07/17/15 0500  WBC 5.2 5.1 2.8* 4.4 3.0*  NEUTROABS  --  4.1  --   --   --   HGB 7.4* 7.2* 8.0* 8.9* 8.0*  HCT 24.3* 23.6* 25.3* 27.7* 24.9*  MCV 75.5* 74.9* 76.2* 76.5* 75.7*  PLT 103* 101* 68* 72* 64*    CBG:  Recent Labs Lab 07/16/15 1118 07/16/15 1650 07/16/15 2120 07/17/15 0716 07/17/15 1114  GLUCAP 262* 225* 341* 131* 211*    Principal Problem:   GI bleed Active Problems:   Anemia   Ascites   Superior mesenteric vein thrombosis (HCC)   Melena   Hepatic  cirrhosis due to chronic hepatitis C infection ([place])   History of hepatitis C - successfuly treated medically   Palliative care encounter   Goals of care, counseling/discussion   DNR (do not resuscitate) discussion   Time coordinating discharge: 35 minutes   Signed:  Murray Hodgkins, MD [facility] 07/17/2015, 5:14 PM    By signing my name below, I, Rennis Harding attest that this documentation has been prepared under the direction and in the presence of Murray Hodgkins, MD Electronically signed: Rennis Harding 07/17/2015   I personally performed the services described in this documentation. All medical record entries made by the scribe were at my direction. I have reviewed the chart and agree that the record reflects my personal performance and is accurate and complete. Murray Hodgkins, MD

## 2015-07-16 NOTE — Progress Notes (Addendum)
Patient ID: [name], female   DOB: July 15, 1960, 55 y.o.   MRN: [id number] Voices no complaints. Sitting up in bed eat a sandwich. Son in room. Underwent an EGD yesterday. Source of bleeding not found. Had one brown BM today.  Blood pressure 103/48, pulse 78, temperature 97.8 F (36.6 C), temperature source Oral, resp. rate 20, height 5\' 6"  (1.676 m), weight 151 lb (68.493 kg), SpO2 97 %.   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    EGD:   [facility], 02/12/2015 Impression:        - Grade I esophageal varices.                      - Scar in the lower third of the esophagus.                      - Roux-en-Y gastrojejunostomy with gastrojejunal                       anastomosis characterized by healthy appearing mucosa.                      - Normal examined jejunum.                      - No specimens collected.       Assessment: Melena. CBC pending. Will continue to monitor.    GI attending note:  Patient continues to complain of right upper quadrant pain. No more melena. She denies hematemesis. Abdomen remains distended but not tense. She has mild tenderness at RUQ. She has splenomegaly.  Hemoglobin 8.9 g.  For repeat lab in a.m. She may also need abdominal tap prior to discharge. Will make the decision tomorrow. If she is interested anticoagulant would be resumed next week for SMV thrombosis.

## 2015-07-16 NOTE — Progress Notes (Signed)
Daily Progress Note   Patient Name: [name]       Date: 07/16/2015 DOB: 1960-06-20  Age: 55 y.o. MRN#: [id number] Attending Physician: Samuella Cota, MD Primary Care Physician: Glo Herring., MD Admit Date: 07/14/2015  Reason for Consultation/Follow-up: Disposition, Establishing goals of care, Hospice Evaluation and Psychosocial/spiritual support  Subjective:  [name] is sitting up in the chair looking out the window, her friend Anderson Malta and son Lorrin Goodell" are at bedside. They leave as I enter.  We talk about her day, that she has not had a paracentesis today, and her goals for when she goes home. She asks about the possibility to add a medication for depression/anxiety. I share that there are some limits related to her liver disease.   [name] shares much of her life with me today, sharing that she will put on a "front" for her son, explaining that she has not shared with him the extent of her illness. She shares her worry over his life when she is gone, sharing that he has cervical dystonia, unable to drive, who will take him to his doctors appointments in [place], she fills his pillbox.    Dr. Laural Golden enters and shares her treatment plan options. He discusses continuing Elaquis for 6 to 8 weeks and then re-imaging her clots, and that if they are gone she will possibly return to the transplant list.  [name] asks, "why continue?"  I ask if she feels that she is closer to death than we do, and she agrees.     Son Dorothea Ogle returns to her room.  [name] is able to have some life review today.  We talk about time and prognosis, and her choices for home health ([facility]) or Hospice when the time is right.  [name] shares that, "I know it will be hospice in the future", and looks to  her son.  He shares that he does not want his mother to suffer, and can can support her with hospice when the time comes.  [name] shares that she feels that she will know when the time is right to stop treatment.   I share that we will follow-up with her tomorrow to continue these discussions.   Length of Stay:   Current Medications: Scheduled Meds:  . B-complex with vitamin  C  1 tablet Oral Daily  . ciprofloxacin  400 mg Intravenous Q12H  . famotidine (PEPCID) IV  20 mg Intravenous Q12H  . furosemide  100 mg Oral Daily  . insulin aspart  0-9 Units Subcutaneous TID WC  . insulin glargine  20 Units Subcutaneous QPM  . multivitamin with minerals  1 tablet Oral Daily  . pantoprazole  40 mg Oral BID AC  . rifaximin  550 mg Oral BID  . sodium chloride flush  3 mL Intravenous Q12H  . spironolactone  300 mg Oral Daily  . sucralfate  1 g Oral TID WC & HS  . traZODone  50 mg Oral QHS    Continuous Infusions:    PRN Meds: sodium chloride, albuterol, baclofen, ondansetron **OR** ondansetron (ZOFRAN) IV, polyethylene glycol, sodium chloride flush  Physical Exam  Constitutional: She is oriented to person, place, and time. No distress.  HENT:  Head: Normocephalic and atraumatic.  Cardiovascular: Normal rate and regular rhythm.   Pulmonary/Chest: Effort normal. No respiratory distress.  Abdominal: Soft. She exhibits distension. There is no guarding.  Neurological: She is alert and oriented to person, place, and time.  Psychiatric: She has a normal mood and affect. Judgment and thought content normal.  Nursing note and vitals reviewed.           Vital Signs: BP 103/48 mmHg  Pulse 78  Temp(Src) 97.8 F (36.6 C) (Oral)  Resp 20  Ht 5\' 6"  (1.676 m)  Wt 68.493 kg (151 lb)  BMI 24.38 kg/m2  SpO2 97% SpO2: SpO2: 97 % O2 Device: O2 Device: Not Delivered O2 Flow Rate:    Intake/output summary:  Intake/Output Summary (Last 24 hours) at 07/16/15 1610 Last data filed at 07/16/15 [id number]   Gross per 24 hour  Intake    983 ml  Output      0 ml  Net    983 ml   LBM: Last BM Date: 07/16/15 Baseline Weight: Weight: 68.04 kg (150 lb) Most recent weight: Weight: 68.493 kg (151 lb)       Palliative Assessment/Data:    Flowsheet Rows        Most Recent Value   Intake Tab    Referral Department  Hospitalist   Unit at Time of Referral  Med/Surg Unit   Palliative Care Primary Diagnosis  Other (Comment) [Hep C with hepatic cirrhosis]   Date Notified  07/14/15   Palliative Care Type  New Palliative care   Reason for referral  End of Life Care Assistance, Psychosocial or Spiritual support   Date of Admission  07/15/15   Date first seen by Palliative Care  07/15/15   # of days Palliative referral response time  1 Day(s)   # of days IP prior to Palliative referral  -1   Clinical Assessment    Psychosocial & Spiritual Assessment    Palliative Care Outcomes       Patient Active Problem List   Diagnosis Date Noted  . Palliative care encounter   . Goals of care, counseling/discussion   . DNR (do not resuscitate) discussion   . Superior mesenteric vein thrombosis ([place]) 07/14/2015  . Melena 07/14/2015  . Upper GI bleed 07/14/2015  . Hepatic cirrhosis due to chronic hepatitis C infection ([place]) 07/14/2015  . History of hepatitis C - successfuly treated medically 07/14/2015  . Portal vein thrombosis 05/13/2014  . Other malaise and fatigue 08/05/2013  . History of liver transplant ([place]) 08/05/2013  . Hypoxemia 08/05/2013  . Abdominal  pain, right upper quadrant 05/29/2013  . SBP (spontaneous bacterial peritonitis) ([place]) 05/28/2013  . Distal radius fracture 10/25/2012  . Metatarsal bone fracture 10/25/2012  . Insomnia 09/25/2012  . Encephalopathy, hepatic ([place]) 06/07/2012  . Hyperglycemia 06/06/2012  . LUQ abdominal pain 06/05/2012  . Ascites 05/14/2012  . Gram-negative bacteremia ([place]) 12/08/2011  . Pyelonephritis 12/07/2011  . Hydronephrosis of right kidney 12/07/2011    . Right ureteral stone 12/07/2011  . Thrombocytopenia ([place]) 12/07/2011  . Cirrhosis of liver ([place]) 05/02/2011  . Anemia 05/02/2011    Palliative Care Assessment & Plan   Patient Profile: 55 y.o. female with past medical history of Type II diabetes, hepatitis C, and hepatic cirrhosis admitted on 07/14/2015 with upper G.I. bleed.  Assessment: As above   Recommendations/Plan:  We talk about taking eliquis for 6-8 weeks, then reimaging her clots. We talk about her choices, that she chooses what this time looks like. We discussed the options of home with home health, PT, social worker, aid to help with bathing. We discussed the options of hospice at home. I share that her primary care physician Dr. Riley Kill can help her putting any of the services in place. She is considering her options at this time, sharing that she knows in the future she will choose hospice, I believe she will choose home health services at this time.  Goals of Care and Additional Recommendations:  Limitations on Scope of Treatment: Continue to treat the treatable, including paracentesis as needed, but no extraordinary measures such as chest compressions or intubation.  Code Status:    Code Status Orders        Start     Ordered   07/14/15 2013  Do not attempt resuscitation (DNR)   Continuous    Question Answer Comment  In the event of cardiac or respiratory ARREST Do not call a "code blue"   In the event of cardiac or respiratory ARREST Do not perform Intubation, CPR, defibrillation or ACLS   In the event of cardiac or respiratory ARREST Use medication by any route, position, wound care, and other measures to relive pain and suffering. May use oxygen, suction and manual treatment of airway obstruction as needed for comfort.      07/14/15 2013    Code Status History    Date Active Date Inactive Code Status Order ID Comments User Context   10/11/2013 11:08 PM 10/17/2013  9:31 PM Full Code [id number]  Orvan Falconer, MD  Inpatient   05/28/2013  3:12 PM 06/03/2013  6:54 PM Full Code [id number]  Orson Eva, MD Inpatient   06/05/2012  9:23 PM 06/08/2012  2:50 PM Full Code [id number]  Bonnielee Haff, MD Inpatient   12/07/2011  1:53 AM 12/10/2011  6:15 PM Full Code [id number]  Elesa Massed, RN ED       Prognosis:   Unable to determine, likely 6 to 12 months, based on disease burden, and declining functional status, as reported by patient.  Discharge Planning:  To Be Determined  Care plan was discussed with nursing staff, CM, Sw, and Dr. Laural Golden.   Thank you for allowing the Palliative Medicine Team to assist in the care of this patient.   Time In: 1400 Time Out: 1600 Total Time 120 minutes Prolonged Time Billed  yes       Greater than 50%  of this time was spent counseling and coordinating care related to the above assessment and plan.  Dove,Tasha A, NP  Please contact Palliative Medicine Team phone at  [phone] for questions and concerns.

## 2015-07-16 NOTE — Progress Notes (Signed)
Present with patient and her son to discuss her request for Advance Directives information. We talked about the information in general terms and then answered her questions about differences in this and a DNR. She shared her strong views about her nutritional needs related to end of life care. Will follow up in order to complete documents after she and her son discuss privately.

## 2015-07-16 NOTE — Progress Notes (Signed)
PROGRESS NOTE  JOURY GUIDER [id number] DOB: [birth year] DOA: 07/14/2015 PCP: Glo Herring., MD  Brief Narrative: 55 year old woman with history of chronic hepatitis C status post treatment with advanced cirrhosis with multiple complications including SMV thrombosis, currently on anticoagulation, no longer a candidate for transplant, presented with black stools. Admitted for GI bleed.  Assessment/Plan: 1. GI bleed complicated by Eliquis. No recurrent bleeding. EGD unrevealing. 2. Advanced cirrhosis secondary to successfully treated chronic hepatitis C 12 years ago, complicated by persistent SMV thrombosis and h/o hepatic encephalopathy, ascites, esophageal varices, right pleural effusion, chronic anemia and thrombocytopenia.  3. Diabetes mellitus type 2, stable.   Overall appears stable. Hemoglobin stable. GI contemplating paracentesis.  Anticipate discharge next 24 hours.  DVT prophylaxis: SCD Code Status: DNR  Family Communication: Family excluded per patient's desire Disposition Plan: home   Murray Hodgkins, MD  [facility] Direct contact: [phone] --[place]  --[email or web address]; password [email or web address]  7PM-7AM contact night coverage as above 07/16/2015, 7:12 AM    Consultants:  Gastroenterology  PMT  Procedures:  EGD Impression: - Grade I esophageal varices.  - Z-line regular, 35 cm from the incisors.  - Roux-en-Y gastrojejunostomy with gastrojejunal   anastomosis characterized by ulceration without   stigmata of bleed.  - Congested (edematous) jejunum.  - No specimens collected  Antimicrobials:  Cipro 6/7 >>  HPI/Subjective: Pain described as burning sensation in abdomen. Denies nausea, vomiting, bleeding.   Objective: Filed Vitals:   07/15/15 1420 07/15/15 1500 07/15/15 2144 07/16/15  0620  BP: 102/76 106/61 95/50 103/48  Pulse: 84 81 75 78  Temp:  98.2 F (36.8 C) 98.4 F (36.9 C) 97.8 F (36.6 C)  TempSrc:  Oral Oral Oral  Resp: 16 16 20 20   Height:      Weight:      SpO2: 97% 97% 96% 97%    Intake/Output Summary (Last 24 hours) at 07/16/15 0712 Last data filed at 07/16/15 0615  Gross per 24 hour  Intake    743 ml  Output      0 ml  Net    743 ml     Filed Weights   07/14/15 1357 07/14/15 1806  Weight: 68.04 kg (150 lb) 68.493 kg (151 lb)    Exam:    Constitutional:  . Appears calm and comfortableSitting in chair. Respiratory:  . CTA bilaterally, no w/r/r.  . Respiratory effort normal. No retractions or accessory muscle use Cardiovascular:  . RRR, no m/r/g . No LE extremity edema   Abdomen:  . Abdomen distended, mild upper/epigastric tenderness to palpation  Psychiatric:  . judgement and insight appear normal . Mental status o Mood, affect appropriate  I have personally reviewed following labs and imaging studies:  CBG 262  Hgb 8.9  Platelets 72  Scheduled Meds: . B-complex with vitamin C  1 tablet Oral Daily  . ciprofloxacin  400 mg Intravenous Q12H  . famotidine (PEPCID) IV  20 mg Intravenous Q12H  . furosemide  100 mg Oral Daily  . insulin aspart  0-9 Units Subcutaneous TID WC  . insulin glargine  20 Units Subcutaneous QPM  . multivitamin with minerals  1 tablet Oral Daily  . pantoprazole  40 mg Oral BID AC  . rifaximin  550 mg Oral BID  . sodium chloride flush  3 mL Intravenous Q12H  . spironolactone  300 mg Oral Daily  . sucralfate  1 g Oral TID WC & HS  . traZODone  50 mg Oral QHS  Continuous Infusions:   Principal Problem:   Upper GI bleed Active Problems:   Anemia   Ascites   Superior mesenteric vein thrombosis (HCC)   Melena   Hepatic cirrhosis due to chronic hepatitis C infection ([place])   History of hepatitis C - successfuly treated medically   Palliative care encounter   Goals of care,  counseling/discussion   DNR (do not resuscitate) discussion     Time spent 20 minutes   By signing my name below, I, Rennis Harding attest that this documentation has been prepared under the direction and in the presence of Murray Hodgkins, MD Electronically signed: Rennis Harding  07/16/2015 1:35pm   I personally performed the services described in this documentation. All medical record entries made by the scribe were at my direction. I have reviewed the chart and agree that the record reflects my personal performance and is accurate and complete. Murray Hodgkins, MD

## 2015-07-17 DIAGNOSIS — K259 Gastric ulcer, unspecified as acute or chronic, without hemorrhage or perforation: Secondary | ICD-10-CM | POA: Diagnosis not present

## 2015-07-17 DIAGNOSIS — D62 Acute posthemorrhagic anemia: Secondary | ICD-10-CM | POA: Diagnosis not present

## 2015-07-17 DIAGNOSIS — Z980 Intestinal bypass and anastomosis status: Secondary | ICD-10-CM | POA: Diagnosis not present

## 2015-07-17 DIAGNOSIS — K921 Melena: Secondary | ICD-10-CM | POA: Diagnosis not present

## 2015-07-17 DIAGNOSIS — K922 Gastrointestinal hemorrhage, unspecified: Secondary | ICD-10-CM

## 2015-07-17 DIAGNOSIS — I8500 Esophageal varices without bleeding: Secondary | ICD-10-CM | POA: Diagnosis not present

## 2015-07-17 DIAGNOSIS — R1011 Right upper quadrant pain: Secondary | ICD-10-CM

## 2015-07-17 DIAGNOSIS — K7031 Alcoholic cirrhosis of liver with ascites: Secondary | ICD-10-CM | POA: Diagnosis not present

## 2015-07-17 DIAGNOSIS — K6389 Other specified diseases of intestine: Secondary | ICD-10-CM | POA: Diagnosis not present

## 2015-07-17 DIAGNOSIS — B182 Chronic viral hepatitis C: Secondary | ICD-10-CM | POA: Diagnosis not present

## 2015-07-17 HISTORY — DX: Gastrointestinal hemorrhage, unspecified: K92.2

## 2015-07-17 LAB — PREPARE RBC (CROSSMATCH)

## 2015-07-17 LAB — GLUCOSE, CAPILLARY
Glucose-Capillary: 341 mg/dL — ABNORMAL HIGH (ref 65–99)
Glucose-Capillary: 131 mg/dL — ABNORMAL HIGH (ref 65–99)
Glucose-Capillary: 211 mg/dL — ABNORMAL HIGH (ref 65–99)

## 2015-07-17 LAB — PROTIME-INR
INR: 1.53 — AB (ref 0.00–1.49)
PROTHROMBIN TIME: 18.4 s — AB (ref 11.6–15.2)

## 2015-07-17 LAB — CBC
HCT: 24.9 % — ABNORMAL LOW (ref 36.0–46.0)
Hemoglobin: 8 g/dL — ABNORMAL LOW (ref 12.0–15.0)
MCH: 24.3 pg — AB (ref 26.0–34.0)
MCHC: 32.1 g/dL (ref 30.0–36.0)
MCV: 75.7 fL — AB (ref 78.0–100.0)
PLATELETS: 64 10*3/uL — AB (ref 150–400)
RBC: 3.29 MIL/uL — AB (ref 3.87–5.11)
RDW: 16.2 % — AB (ref 11.5–15.5)
WBC: 3 10*3/uL — ABNORMAL LOW (ref 4.0–10.5)

## 2015-07-17 MED ORDER — LEVOFLOXACIN 500 MG PO TABS
ORAL_TABLET | ORAL | Status: DC
Start: 2015-07-17 — End: 2015-09-24

## 2015-07-17 MED ORDER — SUCRALFATE 1 GM/10ML PO SUSP: 1.0000 g | Freq: Three times a day (TID) | ORAL | Status: DC

## 2015-07-17 MED ORDER — PANTOPRAZOLE SODIUM 40 MG PO TBEC: 40.0000 mg | DELAYED_RELEASE_TABLET | Freq: Two times a day (BID) | ORAL | Status: DC

## 2015-07-17 MED ORDER — SODIUM CHLORIDE 0.9 % IV SOLN
Freq: Once | INTRAVENOUS | Status: AC
  Administered 2015-07-17: 11:00:00 via INTRAVENOUS

## 2015-07-17 NOTE — Evaluation (Signed)
Physical Therapy Evaluation Patient Details Name: [name] MRN: [id number] DOB: [birth year] Today's Date: 07/17/2015   History of Present Illness  55 yo F admitted 07/14/2015 due to bloody stool for 3-4 days Dx: GI bleed.  PMH: Esophageal varices, cirrhosis, ascites, anemia, liver failure, portal vein thrombosis, heart murmur, DM 2 - no since gastric bypass in 2014, Hep C, HA, anxiety, depression, kidney stones, knee arthroscopy, ORIF distal radius fx, Hernia repair  Clinical Impression  Pt received in bed, with mother and son present, and pt is agreeable to PT evaluation.  Pt is normally very independent at home, and ambulates without AD, she is independent with ADL's, IADL's, and driving at baseline.  Today, she demonstrates very guarded movement with transfers and bed mobility.  She was able to ambulate 243ft with no DME, but with supervision.  She demonstrated guarded gait pattern, and 1 LOB that required CGA for stabilization.  At this point, pt expresses her fear of falling once she is discharged home.  Therefore, recommend skilled HHPT for strength, and balance to return safely to home environment.     Follow Up Recommendations Home health PT    Equipment Recommendations  None recommended by PT    Recommendations for Other Services       Precautions / Restrictions Precautions Precautions: None Restrictions Weight Bearing Restrictions: No      Mobility  Bed Mobility Overal bed mobility: Modified Independent                Transfers Overall transfer level: Modified independent                  Ambulation/Gait Ambulation/Gait assistance: Supervision Ambulation Distance (Feet): 200 Feet Assistive device: None Gait Pattern/deviations: Step-to pattern;Decreased step length - left;Decreased stance time - right;Narrow base of support     General Gait Details: Pt demonstrated 1 LOB which required CGA to correct, and pt demonstrates high guard position with B hands  up, but able to relax them for arm swing when encouraged to do so.    Stairs            Wheelchair Mobility    Modified Rankin (Stroke Patients Only)       Balance Overall balance assessment: Needs assistance         Standing balance support: No upper extremity supported Standing balance-Leahy Scale: Fair                               Pertinent Vitals/Pain Pain Assessment: 0-10 Pain Score: 3  Pain Location: Abdomen Pain Descriptors / Indicators: Tightness Pain Intervention(s): Limited activity within patient's tolerance    Home Living Family/patient expects to be discharged to:: Private residence Living Arrangements: Children (son) Available Help at Discharge: Family Type of Home: House Home Access: Stairs to enter Entrance Stairs-Rails: None Technical brewer of Steps: 3 Home Layout: One level Home Equipment: None      Prior Function Level of Independence: Independent         Comments: Pt states she ambulates without AD, however she admits to being a furniture walker, and will sometimes bump into things.  Pt states she is independent with ADL, and IADL's, and still driving.       Hand Dominance   Dominant Hand: Right    Extremity/Trunk Assessment   Upper Extremity Assessment: Generalized weakness           Lower Extremity Assessment: Generalized weakness  Communication   Communication: No difficulties  Cognition Arousal/Alertness: Awake/alert Behavior During Therapy: WFL for tasks assessed/performed Overall Cognitive Status: Within Functional Limits for tasks assessed                      General Comments      Exercises        Assessment/Plan    PT Assessment Patient needs continued PT services  PT Diagnosis Difficulty walking;Generalized weakness;Abnormality of gait   PT Problem List Decreased strength;Decreased activity tolerance;Decreased balance;Decreased mobility;Decreased knowledge of  use of DME;Decreased knowledge of precautions;Decreased safety awareness  PT Treatment Interventions DME instruction;Gait training;Stair training;Functional mobility training;Therapeutic activities;Therapeutic exercise;Balance training;Patient/family education   PT Goals (Current goals can be found in the Care Plan section) Acute Rehab PT Goals Patient Stated Goal: Pt wants to improve her balance.  PT Goal Formulation: With patient/family Time For Goal Achievement: 07/24/15 Potential to Achieve Goals: Fair    Frequency Min 2X/week   Barriers to discharge        Co-evaluation               End of Session Equipment Utilized During Treatment: Gait belt Activity Tolerance: Patient tolerated treatment well Patient left: in bed;with family/visitor present;with call bell/phone within reach Nurse Communication: Mobility status    Functional Assessment Tool Used: [employer] "6-clicks"  Functional Limitation: Mobility: Walking and moving around Mobility: Walking and Moving Around Current Status [phone]): At least 20 percent but less than 40 percent impaired, limited or restricted Mobility: Walking and Moving Around Goal Status [phone]): At least 1 percent but less than 20 percent impaired, limited or restricted    Time: 1424-1455 PT Time Calculation (min) (ACUTE ONLY): 31 min   Charges:   PT Evaluation $PT Eval Low Complexity: 1 Procedure PT Treatments $Gait Training: 8-22 mins   PT G Codes:   PT G-Codes **NOT FOR INPATIENT CLASS** Functional Assessment Tool Used: [employer] "6-clicks"  Functional Limitation: Mobility: Walking and moving around Mobility: Walking and Moving Around Current Status [phone]): At least 20 percent but less than 40 percent impaired, limited or restricted Mobility: Walking and Moving Around Goal Status [phone]): At least 1 percent but less than 20 percent impaired, limited or restricted   Beth Sulayman Manning, PT, DPT X: [phone]

## 2015-07-17 NOTE — Progress Notes (Signed)
Helped patient with completion of her Advance Directives, placed a copy in her chart and gave her original and several copies.

## 2015-07-17 NOTE — Progress Notes (Addendum)
Patient ID: [name], female   DOB: [birth year], 55 y.o.   MRN: [id number] Sitting at side of bed. Denies any pain. Son in room. Hemoglobin this am 8.0.  CBC    Component Value Date/Time   WBC 3.0* 07/17/2015 0500   RBC 3.29* 07/17/2015 0500   HGB 8.0* 07/17/2015 0500   HCT 24.9* 07/17/2015 0500   PLT 64* 07/17/2015 0500   MCV 75.7* 07/17/2015 0500   MCH 24.3* 07/17/2015 0500   MCHC 32.1 07/17/2015 0500   RDW 16.2* 07/17/2015 0500   LYMPHSABS 0.4* 07/14/2015 1542   MONOABS 0.5 07/14/2015 1542   EOSABS 0.0 07/14/2015 1542   BASOSABS 0.0 07/14/2015 1542   Plan: Consider transfusing with one unit of PRBCs. May start Eliquis in one week.   GI attending note: Patient's abdomen is less distended. She therefore will not need abdominal tap. Unless she has evidence of leaving she should be able to go home later today. Discussed with Dr. Sarajane Jews.

## 2015-07-17 NOTE — Progress Notes (Signed)
PT Cancellation Note  Patient Details Name: [name] MRN: [id number] DOB: 19-Feb-1960   Cancelled Treatment:    Reason Eval/Treat Not Completed: Patient not medically ready.  PT evaluation orders received, and chart reviewed.  Pt is currently receiving a blood transfusion.  Therefore, will hold PT until transfusion is complete.     Beth Perri Lamagna, PT, DPT X: [phone]

## 2015-07-17 NOTE — Progress Notes (Signed)
Pt's IV catheter removed and intact. Pt's IV site clean dry and intact. Discharge instructions and follow up appointments were reviewed and discussed with patient. Pt verbalized understanding of discharge instructions including medications and follow up appointments. All questions were answered and no further questions at this time. Pt in stable condition and in no acute distress at time of discharge. Pt will be escorted by nurse tech.

## 2015-07-17 NOTE — Progress Notes (Signed)
PROGRESS NOTE  [name] [id number] DOB: 09-03-1960 DOA: 07/14/2015 PCP: Glo Herring., MD  Brief Narrative: 55 year old woman with history of chronic hepatitis C status post treatment with advanced cirrhosis with multiple complications including SMV thrombosis, currently on anticoagulation, no longer a candidate for transplant, presented with black stools. Admitted for GI bleed.  Assessment/Plan: 1. GI bleed complicated by Eliquis. No recurrent bleeding. EGD unrevealing.  2. Advanced cirrhosis secondary to successfully treated chronic hepatitis C 12 years ago, complicated by persistent SMV thrombosis and h/o hepatic encephalopathy, ascites, esophageal varices, right pleural effusion, chronic anemia and thrombocytopenia. Stable. No paracentesis per GI. 3. Diabetes mellitus type 2, stable.   Overall improved. Discussed with Dr. Laural Golden, requests 1 unit of packed red blood cells and then may discharge home. May restart Eliquis in one week. Recommends PPI and Carafate. He will arrange outpatient follow-up with his office.  DVT prophylaxis: SCD Code Status: DNR  Family Communication:  Disposition Plan: home today.   Murray Hodgkins, MD  [facility] Direct contact: [phone] --Via amion app OR  --[email or web address]; password [email or web address]  7PM-7AM contact night coverage as above 07/17/2015, 7:27 AM    Consultants:  Gastroenterology  PMT  Procedures:  EGD Impression: - Grade I esophageal varices.  - Z-line regular, 35 cm from the incisors.  - Roux-en-Y gastrojejunostomy with gastrojejunal   anastomosis characterized by ulceration without   stigmata of bleed.  - Congested (edematous) jejunum.  - No specimens collected  Transfused 1 unit  PRBC 6/6  Transfused 1 unit PRBC 6/9  Antimicrobials:  Cipro 6/7  >>  HPI/Subjective: Feeling better then yesterday. Has appetite but does not like hospital choice of food. Burning sensation in abdomen resolved.   Objective: Filed Vitals:   07/16/15 0620 07/16/15 1702 07/16/15 2116 07/17/15 0518  BP: 103/48 122/57 93/55 93/49   Pulse: 78 79 81 80  Temp: 97.8 F (36.6 C) 98.1 F (36.7 C) 98.2 F (36.8 C) 98.1 F (36.7 C)  TempSrc: Oral Oral Oral Oral  Resp: 20 20 20 20   Height:      Weight:      SpO2: 97% 100% 100% 99%    Intake/Output Summary (Last 24 hours) at 07/17/15 0727 Last data filed at 07/16/15 1730  Gross per 24 hour  Intake    483 ml  Output      0 ml  Net    483 ml     Filed Weights   07/14/15 1357 07/14/15 1806  Weight: 68.04 kg (150 lb) 68.493 kg (151 lb)    Exam:    Constitutional:  . Appears calm and comfortable.   Respiratory:  . CTA bilaterally, no w/r/r.  . Respiratory effort normal. No retractions or accessory muscle use Cardiovascular:  . RRR, no m/r/g Abdomen:  . Abdomen distention Stable. Psychiatric:  . judgement and insight appear normal . Mental status o Mood, affect appropriate  I have personally reviewed following labs and imaging studies:  WBC 3.0  Hgb 8.0  Platelets 69  Blood sugars stable.  Scheduled Meds: . B-complex with vitamin C  1 tablet Oral Daily  . ciprofloxacin  500 mg Oral BID  . famotidine  20 mg Oral BID  . furosemide  100 mg Oral Daily  . insulin aspart  0-9 Units Subcutaneous TID WC  . insulin glargine  20 Units Subcutaneous QPM  . multivitamin with minerals  1 tablet Oral Daily  . pantoprazole  40 mg Oral BID AC  . rifaximin  550 mg Oral BID  .  sodium chloride flush  3 mL Intravenous Q12H  . spironolactone  300 mg Oral Daily  . sucralfate  1 g Oral TID WC & HS  . traZODone  50 mg Oral QHS   Continuous Infusions:   Principal Problem:   Upper GI bleed Active Problems:   Anemia   Ascites   Superior mesenteric vein thrombosis (HCC)   Melena   Hepatic  cirrhosis due to chronic hepatitis C infection ([place])   History of hepatitis C - successfuly treated medically   Palliative care encounter   Goals of care, counseling/discussion   DNR (do not resuscitate) discussion      By signing my name below, I, Rennis Harding attest that this documentation has been prepared under the direction and in the presence of Murray Hodgkins, MD Electronically signed: Rennis Harding  07/17/2015 10:39am   I personally performed the services described in this documentation. All medical record entries made by the scribe were at my direction. I have reviewed the chart and agree that the record reflects my personal performance and is accurate and complete. Murray Hodgkins, MD

## 2015-07-17 NOTE — Care Management (Signed)
UPDATE: PT has recommended HH PT, pt is agreeable. Pt has chosen [facility] from list of [facility] providers. Romualdo Bolk, made aware of referral and potential DC today and will obtain pt info from chart. Pt aware HH has 48 hours to initiate services.

## 2015-07-17 NOTE — Care Management Note (Signed)
Case Management Note  Patient Details  Name: [name] MRN: [id number] Date of Birth: 12/14/1960   Expected Discharge Date:     07/17/2015             Expected Discharge Plan:  Home/Self Care  In-House Referral:  NA  Discharge planning Services  CM Consult  Post Acute Care Choice:  NA Choice offered to:  NA  DME Arranged:    DME Agency:     HH Arranged:    [place] Agency:     Status of Service:  Completed, signed off  Medicare Important Message Given:    Date Medicare IM Given:    Medicare IM give by:    Date Additional Medicare IM Given:    Additional Medicare Important Message give by:     If discussed at [place] of Stay Meetings, dates discussed:    Additional Comments: [place] home with self care today. Pt states she does not need HH. Sherald Barge, RN 07/17/2015, 8:05 AM

## 2015-07-18 LAB — TYPE AND SCREEN
ABO/RH(D): B POS
Antibody Screen: NEGATIVE
UNIT DIVISION: 0
UNIT DIVISION: 0
Unit division: 0
Unit division: 0
Unit division: 0

## 2015-07-22 DIAGNOSIS — Z5181 Encounter for therapeutic drug level monitoring: Secondary | ICD-10-CM | POA: Diagnosis not present

## 2015-07-22 DIAGNOSIS — T45515D Adverse effect of anticoagulants, subsequent encounter: Secondary | ICD-10-CM | POA: Diagnosis not present

## 2015-07-22 DIAGNOSIS — Z9981 Dependence on supplemental oxygen: Secondary | ICD-10-CM | POA: Diagnosis not present

## 2015-07-22 DIAGNOSIS — Z794 Long term (current) use of insulin: Secondary | ICD-10-CM | POA: Diagnosis not present

## 2015-07-22 DIAGNOSIS — K551 Chronic vascular disorders of intestine: Secondary | ICD-10-CM | POA: Diagnosis not present

## 2015-07-22 DIAGNOSIS — F341 Dysthymic disorder: Secondary | ICD-10-CM | POA: Diagnosis not present

## 2015-07-22 DIAGNOSIS — K7460 Unspecified cirrhosis of liver: Secondary | ICD-10-CM | POA: Diagnosis not present

## 2015-07-22 DIAGNOSIS — E119 Type 2 diabetes mellitus without complications: Secondary | ICD-10-CM | POA: Diagnosis not present

## 2015-07-22 DIAGNOSIS — B182 Chronic viral hepatitis C: Secondary | ICD-10-CM | POA: Diagnosis not present

## 2015-07-24 DIAGNOSIS — I10 Essential (primary) hypertension: Secondary | ICD-10-CM | POA: Diagnosis not present

## 2015-07-24 DIAGNOSIS — I81 Portal vein thrombosis: Secondary | ICD-10-CM | POA: Diagnosis not present

## 2015-07-24 DIAGNOSIS — Z6823 Body mass index (BMI) 23.0-23.9, adult: Secondary | ICD-10-CM | POA: Diagnosis not present

## 2015-07-24 DIAGNOSIS — R188 Other ascites: Secondary | ICD-10-CM | POA: Diagnosis not present

## 2015-07-27 ENCOUNTER — Encounter (HOSPITAL_COMMUNITY): Payer: Self-pay | Admitting: Internal Medicine

## 2015-07-28 ENCOUNTER — Inpatient Hospital Stay (HOSPITAL_COMMUNITY)
Admission: EM | Admit: 2015-07-28 | Discharge: 2015-07-31 | DRG: 377 | Disposition: A | Discharge status: Home Health | Payer: Medicare Other | Attending: Internal Medicine | Admitting: Internal Medicine

## 2015-07-28 ENCOUNTER — Encounter (HOSPITAL_COMMUNITY): Payer: Self-pay | Admitting: Emergency Medicine

## 2015-07-28 DIAGNOSIS — R1011 Right upper quadrant pain: Secondary | ICD-10-CM | POA: Diagnosis not present

## 2015-07-28 DIAGNOSIS — B182 Chronic viral hepatitis C: Secondary | ICD-10-CM | POA: Diagnosis not present

## 2015-07-28 DIAGNOSIS — K2970 Gastritis, unspecified, without bleeding: Secondary | ICD-10-CM

## 2015-07-28 DIAGNOSIS — N179 Acute kidney failure, unspecified: Secondary | ICD-10-CM | POA: Diagnosis not present

## 2015-07-28 DIAGNOSIS — R188 Other ascites: Secondary | ICD-10-CM | POA: Diagnosis not present

## 2015-07-28 DIAGNOSIS — N201 Calculus of ureter: Secondary | ICD-10-CM

## 2015-07-28 DIAGNOSIS — E871 Hypo-osmolality and hyponatremia: Secondary | ICD-10-CM | POA: Diagnosis present

## 2015-07-28 DIAGNOSIS — Z79899 Other long term (current) drug therapy: Secondary | ICD-10-CM

## 2015-07-28 DIAGNOSIS — K652 Spontaneous bacterial peritonitis: Secondary | ICD-10-CM

## 2015-07-28 DIAGNOSIS — Z9884 Bariatric surgery status: Secondary | ICD-10-CM

## 2015-07-28 DIAGNOSIS — K7682 Hepatic encephalopathy: Secondary | ICD-10-CM

## 2015-07-28 DIAGNOSIS — N12 Tubulo-interstitial nephritis, not specified as acute or chronic: Secondary | ICD-10-CM

## 2015-07-28 DIAGNOSIS — F418 Other specified anxiety disorders: Secondary | ICD-10-CM | POA: Diagnosis present

## 2015-07-28 DIAGNOSIS — I959 Hypotension, unspecified: Secondary | ICD-10-CM | POA: Diagnosis not present

## 2015-07-28 DIAGNOSIS — Z7189 Other specified counseling: Secondary | ICD-10-CM | POA: Diagnosis not present

## 2015-07-28 DIAGNOSIS — K259 Gastric ulcer, unspecified as acute or chronic, without hemorrhage or perforation: Secondary | ICD-10-CM | POA: Diagnosis present

## 2015-07-28 DIAGNOSIS — R739 Hyperglycemia, unspecified: Secondary | ICD-10-CM

## 2015-07-28 DIAGNOSIS — D500 Iron deficiency anemia secondary to blood loss (chronic): Secondary | ICD-10-CM | POA: Diagnosis present

## 2015-07-28 DIAGNOSIS — D649 Anemia, unspecified: Secondary | ICD-10-CM | POA: Diagnosis not present

## 2015-07-28 DIAGNOSIS — R0902 Hypoxemia: Secondary | ICD-10-CM

## 2015-07-28 DIAGNOSIS — Z8711 Personal history of peptic ulcer disease: Secondary | ICD-10-CM

## 2015-07-28 DIAGNOSIS — D696 Thrombocytopenia, unspecified: Secondary | ICD-10-CM | POA: Diagnosis present

## 2015-07-28 DIAGNOSIS — R7881 Bacteremia: Secondary | ICD-10-CM

## 2015-07-28 DIAGNOSIS — E876 Hypokalemia: Secondary | ICD-10-CM | POA: Diagnosis not present

## 2015-07-28 DIAGNOSIS — Z7901 Long term (current) use of anticoagulants: Secondary | ICD-10-CM

## 2015-07-28 DIAGNOSIS — I8510 Secondary esophageal varices without bleeding: Secondary | ICD-10-CM | POA: Diagnosis present

## 2015-07-28 DIAGNOSIS — K7031 Alcoholic cirrhosis of liver with ascites: Secondary | ICD-10-CM | POA: Diagnosis not present

## 2015-07-28 DIAGNOSIS — K766 Portal hypertension: Secondary | ICD-10-CM | POA: Diagnosis not present

## 2015-07-28 DIAGNOSIS — I81 Portal vein thrombosis: Secondary | ICD-10-CM | POA: Diagnosis not present

## 2015-07-28 DIAGNOSIS — E119 Type 2 diabetes mellitus without complications: Secondary | ICD-10-CM

## 2015-07-28 DIAGNOSIS — K921 Melena: Principal | ICD-10-CM | POA: Diagnosis present

## 2015-07-28 DIAGNOSIS — K55069 Acute infarction of intestine, part and extent unspecified: Secondary | ICD-10-CM

## 2015-07-28 DIAGNOSIS — K7290 Hepatic failure, unspecified without coma: Secondary | ICD-10-CM | POA: Diagnosis present

## 2015-07-28 DIAGNOSIS — Z794 Long term (current) use of insulin: Secondary | ICD-10-CM

## 2015-07-28 DIAGNOSIS — D62 Acute posthemorrhagic anemia: Secondary | ICD-10-CM | POA: Diagnosis not present

## 2015-07-28 DIAGNOSIS — Z87891 Personal history of nicotine dependence: Secondary | ICD-10-CM

## 2015-07-28 DIAGNOSIS — K922 Gastrointestinal hemorrhage, unspecified: Secondary | ICD-10-CM | POA: Diagnosis not present

## 2015-07-28 DIAGNOSIS — G4700 Insomnia, unspecified: Secondary | ICD-10-CM

## 2015-07-28 DIAGNOSIS — Z944 Liver transplant status: Secondary | ICD-10-CM

## 2015-07-28 DIAGNOSIS — B1920 Unspecified viral hepatitis C without hepatic coma: Secondary | ICD-10-CM | POA: Diagnosis present

## 2015-07-28 DIAGNOSIS — Z515 Encounter for palliative care: Secondary | ICD-10-CM

## 2015-07-28 DIAGNOSIS — D72819 Decreased white blood cell count, unspecified: Secondary | ICD-10-CM | POA: Diagnosis present

## 2015-07-28 DIAGNOSIS — Z8619 Personal history of other infectious and parasitic diseases: Secondary | ICD-10-CM

## 2015-07-28 DIAGNOSIS — K639 Disease of intestine, unspecified: Secondary | ICD-10-CM | POA: Diagnosis present

## 2015-07-28 DIAGNOSIS — K7460 Unspecified cirrhosis of liver: Secondary | ICD-10-CM | POA: Diagnosis present

## 2015-07-28 DIAGNOSIS — R1012 Left upper quadrant pain: Secondary | ICD-10-CM

## 2015-07-28 DIAGNOSIS — IMO0002 Reserved for concepts with insufficient information to code with codable children: Secondary | ICD-10-CM

## 2015-07-28 DIAGNOSIS — N1330 Unspecified hydronephrosis: Secondary | ICD-10-CM

## 2015-07-28 HISTORY — DX: Personal history of other infectious and parasitic diseases: Z86.19

## 2015-07-28 HISTORY — DX: Gastrointestinal hemorrhage, unspecified: K92.2

## 2015-07-28 HISTORY — DX: Type 2 diabetes mellitus without complications: E11.9

## 2015-07-28 HISTORY — DX: Disorder of kidney and ureter, unspecified: N28.9

## 2015-07-28 HISTORY — DX: Gastric ulcer, unspecified as acute or chronic, without hemorrhage or perforation: K25.9

## 2015-07-28 HISTORY — DX: Disease of intestine, unspecified: K63.9

## 2015-07-28 HISTORY — DX: Calculus of ureter: N20.1

## 2015-07-28 HISTORY — DX: Reserved for concepts with insufficient information to code with codable children: IMO0002

## 2015-07-28 HISTORY — DX: Bacteremia: R78.81

## 2015-07-28 LAB — COMPREHENSIVE METABOLIC PANEL
ALBUMIN: 3.2 g/dL — AB (ref 3.5–5.0)
ALK PHOS: 65 U/L (ref 38–126)
ALT: 31 U/L (ref 14–54)
AST: 26 U/L (ref 15–41)
Anion gap: 7 (ref 5–15)
BILIRUBIN TOTAL: 0.9 mg/dL (ref 0.3–1.2)
BUN: 18 mg/dL (ref 6–20)
CALCIUM: 8.3 mg/dL — AB (ref 8.9–10.3)
CO2: 26 mmol/L (ref 22–32)
Chloride: 99 mmol/L — ABNORMAL LOW (ref 101–111)
Creatinine, Ser: 1.27 mg/dL — ABNORMAL HIGH (ref 0.44–1.00)
GFR calc Af Amer: 55 mL/min — ABNORMAL LOW (ref >60)
GFR calc non Af Amer: 47 mL/min — ABNORMAL LOW (ref >60)
GLUCOSE: 191 mg/dL — AB (ref 65–99)
POTASSIUM: 3.5 mmol/L (ref 3.5–5.1)
Sodium: 132 mmol/L — ABNORMAL LOW (ref 135–145)
TOTAL PROTEIN: 6.4 g/dL — AB (ref 6.5–8.1)

## 2015-07-28 LAB — CBC
HEMATOCRIT: 28.7 % — AB (ref 36.0–46.0)
Hemoglobin: 9.3 g/dL — ABNORMAL LOW (ref 12.0–15.0)
MCH: 24.7 pg — AB (ref 26.0–34.0)
MCHC: 32.4 g/dL (ref 30.0–36.0)
MCV: 76.3 fL — ABNORMAL LOW (ref 78.0–100.0)
Platelets: 60 10*3/uL — ABNORMAL LOW (ref 150–400)
RBC: 3.76 MIL/uL — ABNORMAL LOW (ref 3.87–5.11)
RDW: 17 % — AB (ref 11.5–15.5)
WBC: 4.2 10*3/uL (ref 4.0–10.5)

## 2015-07-28 LAB — PROTIME-INR
INR: 1.62 — AB (ref 0.00–1.49)
Prothrombin Time: 19.2 seconds — ABNORMAL HIGH (ref 11.6–15.2)

## 2015-07-28 LAB — POC OCCULT BLOOD, ED: Fecal Occult Bld: POSITIVE — AB

## 2015-07-28 NOTE — ED Notes (Signed)
Dr Kim at bedside,  

## 2015-07-28 NOTE — ED Provider Notes (Signed)
CSN: [id number]     Arrival date & time 07/28/15  2028 History   First MD Initiated Contact with Patient 07/28/15 2227     Chief Complaint  Patient presents with  . GI Bleeding     (Consider location/radiation/quality/duration/timing/severity/associated sxs/prior Treatment) Patient is a 55 y.o. female presenting with hematochezia. The history is provided by the patient. No language interpreter was used.  Rectal Bleeding Quality:  Bright red Amount:  Unable to specify Duration:  1 day Timing:  Constant Progression:  Worsening Chronicity:  Recurrent Context: diarrhea   Similar prior episodes: yes   Relieved by:  Nothing Worsened by:  Nothing tried Ineffective treatments:  None tried Associated symptoms: light-headedness   Risk factors: anticoagulant use   Pt has a history of HEP C and liver failure.  Pt was on coumadin until admission on 6/6.  Pt was restarted on eloquis this week.  Pt complains of severe fatigue.  Pt reports anything she eat or drinks triggers diarrhea.  Past Medical History  Diagnosis Date  . Esophageal varices ([place])   . Cirrhosis ([place])     non alcoholic  . Ascites   . Anemia   . Liver failure ([place])     "I'm on liver transplant list @ [facility]" (11/07/2012)  . Portal vein thrombosis   . Complication of anesthesia   . PONV (postoperative nausea and vomiting)   . Heart murmur   . Type II diabetes mellitus ([place])     "not since gastric bypass" (11/07/2012)  . Hepatitis C     Hx: of Hep "C" it was eradicated  . [id number])     "monthly" (11/07/2012)  . Anxiety   . Depression   . Kidney stones    Past Surgical History  Procedure Laterality Date  . Abdominal hysterectomy  2003  . Upper gastrointestinal endoscopy    . Colonoscopy    . Gastric bypass  ~ 1995  . Cesarean section  1995  . Lithotripsy      "several times" (11/07/2012)  . Knee arthroscopy Right   . Colonoscopy  10/21/2011    Procedure: COLONOSCOPY;  Surgeon: Rogene Houston, MD;  [facility]  [facility];  Service: Endoscopy;  Laterality: N/A;  245   . Cystoscopy with stent placement  12/08/2011    Procedure: CYSTOSCOPY WITH STENT PLACEMENT;  Surgeon: Marissa Nestle, MD;  Location: [facility] ORS;  Service: Urology;  Laterality: Right;  . Cystoscopy w/ retrogrades  12/08/2011    Procedure: CYSTOSCOPY WITH RETROGRADE PYELOGRAM;  Surgeon: Marissa Nestle, MD;  Location: [facility] OR[facility];  Service: Urology;  Laterality: Right;  . Dilation and curettage of uterus    . Orif distal radius fracture Right 11/07/2012  . Cholecystectomy  1987  . Hernia repair  04/14/11    "one in my bellybutton" (11/07/2012)  . Inguinal hernia repair Right     "maybe 2" (11/07/2012)  . Open reduction internal fixation (orif) distal radial fracture Right 11/07/2012    Procedure: OPEN REDUCTION INTERNAL FIXATION (ORIF) RIGHT DISTAL RADIAL FRACTURE;  Surgeon: [name], MD;  Location: [place];  Service: Orthopedics;  Laterality: Right;  . Esophagogastroduodenoscopy N/A 02/19/2014    Procedure: ESOPHAGOGASTRODUODENOSCOPY (EGD);  Surgeon: Rogene Houston, MD;  [facility];  Service: Endoscopy;  Laterality: N/A;  100  . Esophagogastroduodenoscopy N/A 07/15/2015    Procedure: ESOPHAGOGASTRODUODENOSCOPY (EGD);  Surgeon: Rogene Houston, MD;  [facility];  Service: Endoscopy;  Laterality: N/A;   Family History  Problem Relation Age of Onset  . Dementia Mother   . Heart disease Father   . Liver disease Father   . Hypertension Father   . Diabetes Father   . Dementia Father   . Hypertension Brother   . Other Son     Cervical dystonia   Social History  Substance Use Topics  . Smoking status: Former Smoker -- 0.50 packs/day for 5 years    Types: Cigarettes    Quit date: 05/02/1991  . Smokeless tobacco: Never Used  . Alcohol Use: No   OB History    No data available     Review of Systems  Gastrointestinal: Positive for blood in stool and hematochezia.  Neurological: Positive for weakness and  light-headedness.  All other systems reviewed and are negative.     Allergies  Ancef; Gadobenate; Iodinated diagnostic agents; and Tape  Home Medications   Prior to Admission medications   Medication Sig Start Date End Date Taking? Authorizing Provider  b complex vitamins tablet Take 1 tablet by mouth daily.   Yes Historical Provider, MD  ELIQUIS 2.5 MG TABS tablet Take 2.5 mg by mouth 2 (two) times daily. 07/09/15  Yes Historical Provider, MD  escitalopram (LEXAPRO) 10 MG tablet Take 10 mg by mouth daily. 07/24/15  Yes Historical Provider, MD  ferrous sulfate 325 (65 FE) MG tablet Take 325 mg by mouth 2 (two) times daily with a meal.   Yes Historical Provider, MD  furosemide (LASIX) 40 MG tablet Take 100 mg by mouth daily.    Yes Historical Provider, MD  LANTUS SOLOSTAR 100 UNIT/ML Solostar Pen Inject 20 Units into the skin every evening.  08/27/14  Yes Historical Provider, MD  Multiple Vitamin (MULTIVITAMIN WITH MINERALS) TABS tablet Take 1 tablet by mouth daily.   Yes Historical Provider, MD  pantoprazole (PROTONIX) 40 MG tablet Take 1 tablet (40 mg total) by mouth 2 (two) times daily before a meal. 07/17/15  Yes Samuella Cota, MD  spironolactone (ALDACTONE) 100 MG tablet Take 300 mg by mouth daily.    Yes Historical Provider, MD  traZODone (DESYREL) 50 MG tablet Take 1 tablet (50 mg total) by mouth at bedtime. 08/15/14  Yes Rogene Houston, MD  VENTOLIN HFA 108 (90 Base) MCG/ACT inhaler Inhale 1-2 puffs into the lungs every 6 (six) hours as needed for wheezing.  05/01/15  Yes Historical Provider, MD  baclofen (LIORESAL) 10 MG tablet Take 1 tablet (10 mg total) by mouth 3 (three) times daily as needed for muscle spasms (alternates with baclofen). 05/13/14   Rogene Houston, MD  cyclobenzaprine (FLEXERIL) 10 MG tablet Take 1 tablet (10 mg total) by mouth 3 (three) times daily as needed for muscle spasms. As needed 08/15/14   Rogene Houston, MD  lactulose (CHRONULAC) 10 GM/15ML solution Take 45  mLs (30 g total) by mouth 3 (three) times daily. Patient not taking: Reported on 07/14/2015 06/03/13   Erline Hau, MD  levofloxacin (LEVAQUIN) 500 MG tablet TAKE (1) TABLET BY MOUTH ONCE DAILY. 07/17/15   Samuella Cota, MD  rifaximin (XIFAXAN) 550 MG TABS tablet Take 1 tablet (550 mg total) by mouth 2 (two) times daily. 05/13/14   Rogene Houston, MD  sucralfate (CARAFATE) 1 g tablet Take 1 g by mouth 4 (four) times daily. 07/17/15   Historical Provider, MD  sucralfate (CARAFATE) 1 GM/10ML suspension Take 10 mLs (1 g total) by mouth 4 (four) times daily -  with meals and at  bedtime. 07/17/15   Samuella Cota, MD   BP 121/56 mmHg  Pulse 88  Temp(Src) 98.1 F (36.7 C) (Oral)  Resp 18  Ht 5\' 6"  (1.676 m)  Wt 64.411 kg  BMI 22.93 kg/m2  SpO2 99% Physical Exam  Constitutional: She is oriented to person, place, and time. She appears well-developed and well-nourished.  HENT:  Head: Normocephalic and atraumatic.  Right Ear: External ear normal.  Left Ear: External ear normal.  Mouth/Throat: Oropharynx is clear and moist.  Eyes: EOM are normal. Pupils are equal, round, and reactive to light.  Neck: Normal range of motion.  Cardiovascular: Normal rate and normal heart sounds.   Pulmonary/Chest: Effort normal.  Abdominal: Soft. She exhibits no distension. There is no tenderness.  Genitourinary: Guaiac positive stool.  No stool in vault, yellow mucus only  Heme positive  Musculoskeletal: Normal range of motion.  Neurological: She is alert and oriented to person, place, and time.  Skin: Skin is warm.  Psychiatric: She has a normal mood and affect.  Nursing note and vitals reviewed.   ED Course  Procedures (including critical care time) Labs Review Labs Reviewed  COMPREHENSIVE METABOLIC PANEL - Abnormal; Notable for the following:    Sodium 132 (*)    Chloride 99 (*)    Glucose, Bld 191 (*)    Creatinine, Ser 1.27 (*)    Calcium 8.3 (*)    Total Protein 6.4 (*)    Albumin  3.2 (*)    GFR calc non Af Amer 47 (*)    GFR calc Af Amer 55 (*)    All other components within normal limits  CBC - Abnormal; Notable for the following:    RBC 3.76 (*)    Hemoglobin 9.3 (*)    HCT 28.7 (*)    MCV 76.3 (*)    MCH 24.7 (*)    RDW 17.0 (*)    Platelets 60 (*)    All other components within normal limits  PROTIME-INR - Abnormal; Notable for the following:    Prothrombin Time 19.2 (*)    INR 1.62 (*)    All other components within normal limits  POC OCCULT BLOOD, ED - Abnormal; Notable for the following:    Fecal Occult Bld POSITIVE (*)    All other components within normal limits  TYPE AND SCREEN    Imaging Review No results found. I have personally reviewed and evaluated these images and lab results as part of my medical decision-making.   EKG Interpretation None      MDM Pt has elevation to creatinine.  Hemoglobin is 9.3.  INR remains elevated at 19.2.  I discussed with Dr. Wyvonnia Dusky.   Dr. Wyvonnia Dusky in to see.  I will discuss with hospitalist for admission as pt has active bleeding   Final diagnoses:  Acute lower GI bleeding  Anemia, blood loss    Dr. Maudie Mercury will admit to step down for observation    Fransico Meadow, PA-C 07/28/15 [place], MD 07/29/15 [phone]

## 2015-07-28 NOTE — ED Notes (Addendum)
Pt states that she was recently in hospital for rectal bleeding, had stopped taking eliquis until this past Friday when she started taking it again, started having bright red rectal bleeding that started today with generalized weakness,

## 2015-07-28 NOTE — ED Notes (Signed)
Pt states her rectal bleeding started again today, pt was admitted last week for the same.

## 2015-07-29 ENCOUNTER — Encounter (HOSPITAL_COMMUNITY)
Admission: EM | Disposition: A | Discharge status: Home Health | Payer: Self-pay | Source: Home / Self Care | Attending: Internal Medicine

## 2015-07-29 ENCOUNTER — Encounter (HOSPITAL_COMMUNITY): Payer: Self-pay | Admitting: Internal Medicine

## 2015-07-29 DIAGNOSIS — Z515 Encounter for palliative care: Secondary | ICD-10-CM | POA: Diagnosis not present

## 2015-07-29 DIAGNOSIS — B1920 Unspecified viral hepatitis C without hepatic coma: Secondary | ICD-10-CM | POA: Diagnosis present

## 2015-07-29 DIAGNOSIS — D62 Acute posthemorrhagic anemia: Secondary | ICD-10-CM

## 2015-07-29 DIAGNOSIS — K7290 Hepatic failure, unspecified without coma: Secondary | ICD-10-CM | POA: Diagnosis present

## 2015-07-29 DIAGNOSIS — N179 Acute kidney failure, unspecified: Secondary | ICD-10-CM | POA: Diagnosis present

## 2015-07-29 DIAGNOSIS — E876 Hypokalemia: Secondary | ICD-10-CM

## 2015-07-29 DIAGNOSIS — Z87891 Personal history of nicotine dependence: Secondary | ICD-10-CM | POA: Diagnosis not present

## 2015-07-29 DIAGNOSIS — Z794 Long term (current) use of insulin: Secondary | ICD-10-CM | POA: Diagnosis not present

## 2015-07-29 DIAGNOSIS — Z8711 Personal history of peptic ulcer disease: Secondary | ICD-10-CM | POA: Diagnosis not present

## 2015-07-29 DIAGNOSIS — Z9884 Bariatric surgery status: Secondary | ICD-10-CM | POA: Diagnosis not present

## 2015-07-29 DIAGNOSIS — Z79899 Other long term (current) drug therapy: Secondary | ICD-10-CM | POA: Diagnosis not present

## 2015-07-29 DIAGNOSIS — E119 Type 2 diabetes mellitus without complications: Secondary | ICD-10-CM

## 2015-07-29 DIAGNOSIS — K766 Portal hypertension: Secondary | ICD-10-CM | POA: Diagnosis not present

## 2015-07-29 DIAGNOSIS — K7031 Alcoholic cirrhosis of liver with ascites: Secondary | ICD-10-CM | POA: Diagnosis not present

## 2015-07-29 DIAGNOSIS — I8500 Esophageal varices without bleeding: Secondary | ICD-10-CM | POA: Diagnosis not present

## 2015-07-29 DIAGNOSIS — K921 Melena: Secondary | ICD-10-CM | POA: Diagnosis present

## 2015-07-29 DIAGNOSIS — I81 Portal vein thrombosis: Secondary | ICD-10-CM | POA: Diagnosis not present

## 2015-07-29 DIAGNOSIS — K228 Other specified diseases of esophagus: Secondary | ICD-10-CM | POA: Diagnosis not present

## 2015-07-29 DIAGNOSIS — D72819 Decreased white blood cell count, unspecified: Secondary | ICD-10-CM | POA: Diagnosis present

## 2015-07-29 DIAGNOSIS — Z980 Intestinal bypass and anastomosis status: Secondary | ICD-10-CM | POA: Diagnosis not present

## 2015-07-29 DIAGNOSIS — K2970 Gastritis, unspecified, without bleeding: Secondary | ICD-10-CM | POA: Diagnosis present

## 2015-07-29 DIAGNOSIS — K639 Disease of intestine, unspecified: Secondary | ICD-10-CM | POA: Diagnosis not present

## 2015-07-29 DIAGNOSIS — I959 Hypotension, unspecified: Secondary | ICD-10-CM | POA: Diagnosis present

## 2015-07-29 DIAGNOSIS — Z7189 Other specified counseling: Secondary | ICD-10-CM | POA: Diagnosis not present

## 2015-07-29 DIAGNOSIS — D500 Iron deficiency anemia secondary to blood loss (chronic): Secondary | ICD-10-CM | POA: Diagnosis not present

## 2015-07-29 DIAGNOSIS — K922 Gastrointestinal hemorrhage, unspecified: Secondary | ICD-10-CM | POA: Diagnosis present

## 2015-07-29 DIAGNOSIS — F418 Other specified anxiety disorders: Secondary | ICD-10-CM | POA: Diagnosis present

## 2015-07-29 DIAGNOSIS — R188 Other ascites: Secondary | ICD-10-CM | POA: Diagnosis not present

## 2015-07-29 DIAGNOSIS — D696 Thrombocytopenia, unspecified: Secondary | ICD-10-CM | POA: Diagnosis present

## 2015-07-29 DIAGNOSIS — K7460 Unspecified cirrhosis of liver: Secondary | ICD-10-CM | POA: Diagnosis not present

## 2015-07-29 DIAGNOSIS — B182 Chronic viral hepatitis C: Secondary | ICD-10-CM

## 2015-07-29 DIAGNOSIS — D649 Anemia, unspecified: Secondary | ICD-10-CM | POA: Diagnosis not present

## 2015-07-29 DIAGNOSIS — R1011 Right upper quadrant pain: Secondary | ICD-10-CM | POA: Diagnosis not present

## 2015-07-29 DIAGNOSIS — E871 Hypo-osmolality and hyponatremia: Secondary | ICD-10-CM

## 2015-07-29 DIAGNOSIS — I8510 Secondary esophageal varices without bleeding: Secondary | ICD-10-CM | POA: Diagnosis present

## 2015-07-29 DIAGNOSIS — Z7901 Long term (current) use of anticoagulants: Secondary | ICD-10-CM | POA: Diagnosis not present

## 2015-07-29 DIAGNOSIS — K7030 Alcoholic cirrhosis of liver without ascites: Secondary | ICD-10-CM

## 2015-07-29 DIAGNOSIS — M6281 Muscle weakness (generalized): Secondary | ICD-10-CM | POA: Diagnosis not present

## 2015-07-29 DIAGNOSIS — K259 Gastric ulcer, unspecified as acute or chronic, without hemorrhage or perforation: Secondary | ICD-10-CM | POA: Diagnosis present

## 2015-07-29 HISTORY — PX: ESOPHAGOGASTRODUODENOSCOPY: SHX5428

## 2015-07-29 LAB — COMPREHENSIVE METABOLIC PANEL
ALK PHOS: 52 U/L (ref 38–126)
ALT: 26 U/L (ref 14–54)
AST: 22 U/L (ref 15–41)
Albumin: 2.6 g/dL — ABNORMAL LOW (ref 3.5–5.0)
Anion gap: 6 (ref 5–15)
BILIRUBIN TOTAL: 1 mg/dL (ref 0.3–1.2)
BUN: 17 mg/dL (ref 6–20)
CALCIUM: 7.9 mg/dL — AB (ref 8.9–10.3)
CO2: 27 mmol/L (ref 22–32)
CREATININE: 1.16 mg/dL — AB (ref 0.44–1.00)
Chloride: 101 mmol/L (ref 101–111)
GFR, EST NON AFRICAN AMERICAN: 53 mL/min — AB (ref >60)
Glucose, Bld: 123 mg/dL — ABNORMAL HIGH (ref 65–99)
Potassium: 3.7 mmol/L (ref 3.5–5.1)
Sodium: 134 mmol/L — ABNORMAL LOW (ref 135–145)
TOTAL PROTEIN: 5.3 g/dL — AB (ref 6.5–8.1)

## 2015-07-29 LAB — CBC
HEMATOCRIT: 25.2 % — AB (ref 36.0–46.0)
HEMATOCRIT: 27.9 % — AB (ref 36.0–46.0)
HEMOGLOBIN: 8 g/dL — AB (ref 12.0–15.0)
Hemoglobin: 8.9 g/dL — ABNORMAL LOW (ref 12.0–15.0)
MCH: 24.3 pg — AB (ref 26.0–34.0)
MCH: 25.1 pg — AB (ref 26.0–34.0)
MCHC: 31.7 g/dL (ref 30.0–36.0)
MCHC: 31.9 g/dL (ref 30.0–36.0)
MCV: 76.6 fL — AB (ref 78.0–100.0)
MCV: 78.6 fL (ref 78.0–100.0)
PLATELETS: 58 10*3/uL — AB (ref 150–400)
Platelets: 58 10*3/uL — ABNORMAL LOW (ref 150–400)
RBC: 3.29 MIL/uL — AB (ref 3.87–5.11)
RBC: 3.55 MIL/uL — ABNORMAL LOW (ref 3.87–5.11)
RDW: 16.9 % — ABNORMAL HIGH (ref 11.5–15.5)
RDW: 17.8 % — AB (ref 11.5–15.5)
WBC: 2.8 10*3/uL — AB (ref 4.0–10.5)
WBC: 3.5 10*3/uL — ABNORMAL LOW (ref 4.0–10.5)

## 2015-07-29 LAB — TSH: TSH: 2.403 u[IU]/mL (ref 0.350–4.500)

## 2015-07-29 LAB — GLUCOSE, CAPILLARY
GLUCOSE-CAPILLARY: 122 mg/dL — AB (ref 65–99)
GLUCOSE-CAPILLARY: 78 mg/dL (ref 65–99)
Glucose-Capillary: 116 mg/dL — ABNORMAL HIGH (ref 65–99)
Glucose-Capillary: 118 mg/dL — ABNORMAL HIGH (ref 65–99)
Glucose-Capillary: 102 mg/dL — ABNORMAL HIGH (ref 65–99)
Glucose-Capillary: 82 mg/dL (ref 65–99)

## 2015-07-29 LAB — PREPARE RBC (CROSSMATCH)

## 2015-07-29 LAB — MRSA PCR SCREENING: MRSA BY PCR: NEGATIVE

## 2015-07-29 SURGERY — EGD (ESOPHAGOGASTRODUODENOSCOPY)
Anesthesia: Moderate Sedation

## 2015-07-29 MED ORDER — TRAZODONE HCL 50 MG PO TABS
50.0000 mg | ORAL_TABLET | Freq: Every day | ORAL | Status: DC
  Administered 2015-07-29 – 2015-07-30 (×3): 50 mg via ORAL
  Filled 2015-07-29 (×3): qty 1

## 2015-07-29 MED ORDER — SODIUM CHLORIDE 0.9 % IV SOLN
INTRAVENOUS | Status: DC
Start: 2015-07-29 — End: 2015-07-31

## 2015-07-29 MED ORDER — INSULIN ASPART 100 UNIT/ML ~~LOC~~ SOLN
0.0000 [IU] | Freq: Three times a day (TID) | SUBCUTANEOUS | Status: DC
  Administered 2015-07-30: 1 [IU] via SUBCUTANEOUS
  Administered 2015-07-30 (×2): 3 [IU] via SUBCUTANEOUS
  Administered 2015-07-30 – 2015-07-31 (×2): 5 [IU] via SUBCUTANEOUS
  Administered 2015-07-31: 1 [IU] via SUBCUTANEOUS

## 2015-07-29 MED ORDER — RIFAXIMIN 550 MG PO TABS
550.0000 mg | ORAL_TABLET | Freq: Two times a day (BID) | ORAL | Status: DC
  Administered 2015-07-29 – 2015-07-31 (×6): 550 mg via ORAL
  Filled 2015-07-29 (×6): qty 1

## 2015-07-29 MED ORDER — MIDAZOLAM HCL 5 MG/5ML IJ SOLN
INTRAMUSCULAR | Status: AC
  Filled 2015-07-29: qty 10

## 2015-07-29 MED ORDER — POTASSIUM CHLORIDE IN NACL 20-0.9 MEQ/L-% IV SOLN
INTRAVENOUS | Status: DC
  Administered 2015-07-29 – 2015-07-31 (×5): via INTRAVENOUS

## 2015-07-29 MED ORDER — MEPERIDINE HCL 50 MG/ML IJ SOLN
INTRAMUSCULAR | Status: DC | PRN
  Administered 2015-07-29: 25 mg via INTRAVENOUS

## 2015-07-29 MED ORDER — FUROSEMIDE 20 MG PO TABS
20.0000 mg | ORAL_TABLET | Freq: Two times a day (BID) | ORAL | Status: DC
  Administered 2015-07-29: 20 mg via ORAL
  Filled 2015-07-29 (×2): qty 1

## 2015-07-29 MED ORDER — BUTAMBEN-TETRACAINE-BENZOCAINE 2-2-14 % EX AERO
INHALATION_SPRAY | CUTANEOUS | Status: DC | PRN
  Administered 2015-07-29: 2 via TOPICAL

## 2015-07-29 MED ORDER — SODIUM CHLORIDE 0.9 % IV SOLN
Freq: Once | INTRAVENOUS | Status: AC
  Administered 2015-07-29: 08:00:00 via INTRAVENOUS

## 2015-07-29 MED ORDER — ALBUTEROL SULFATE (2.5 MG/3ML) 0.083% IN NEBU: 3.0000 mL | INHALATION_SOLUTION | Freq: Four times a day (QID) | RESPIRATORY_TRACT | Status: DC | PRN

## 2015-07-29 MED ORDER — POTASSIUM CHLORIDE 10 MEQ/100ML IV SOLN
10.0000 meq | INTRAVENOUS | Status: AC
  Administered 2015-07-29 (×2): 10 meq via INTRAVENOUS
  Filled 2015-07-29 (×2): qty 100

## 2015-07-29 MED ORDER — BACLOFEN 10 MG PO TABS
10.0000 mg | ORAL_TABLET | Freq: Three times a day (TID) | ORAL | Status: DC | PRN
  Filled 2015-07-29: qty 1

## 2015-07-29 MED ORDER — LEVOFLOXACIN 500 MG PO TABS
500.0000 mg | ORAL_TABLET | Freq: Every day | ORAL | Status: DC
  Administered 2015-07-29 – 2015-07-31 (×3): 500 mg via ORAL
  Filled 2015-07-29 (×3): qty 1

## 2015-07-29 MED ORDER — SODIUM CHLORIDE 0.9% FLUSH
3.0000 mL | Freq: Two times a day (BID) | INTRAVENOUS | Status: DC
  Administered 2015-07-29 – 2015-07-30 (×3): 3 mL via INTRAVENOUS

## 2015-07-29 MED ORDER — MEPERIDINE HCL 50 MG/ML IJ SOLN
INTRAMUSCULAR | Status: AC
  Filled 2015-07-29: qty 1

## 2015-07-29 MED ORDER — PANTOPRAZOLE SODIUM 40 MG IV SOLR
INTRAVENOUS | Status: AC
  Filled 2015-07-29: qty 160

## 2015-07-29 MED ORDER — FUROSEMIDE 10 MG/ML IJ SOLN
40.0000 mg | Freq: Every day | INTRAMUSCULAR | Status: DC
  Filled 2015-07-29: qty 4

## 2015-07-29 MED ORDER — SODIUM CHLORIDE 0.9 % IV SOLN
8.0000 mg/h | INTRAVENOUS | Status: DC
  Administered 2015-07-29 – 2015-07-30 (×3): 8 mg/h via INTRAVENOUS
  Filled 2015-07-29 (×7): qty 80

## 2015-07-29 MED ORDER — SODIUM CHLORIDE 0.9 % IV SOLN
INTRAVENOUS | Status: DC
  Administered 2015-07-29: 02:00:00 via INTRAVENOUS

## 2015-07-29 MED ORDER — SODIUM CHLORIDE 0.9 % IV SOLN
80.0000 mg | Freq: Once | INTRAVENOUS | Status: AC
  Administered 2015-07-29: 80 mg via INTRAVENOUS
  Filled 2015-07-29: qty 80

## 2015-07-29 MED ORDER — MIDAZOLAM HCL 5 MG/5ML IJ SOLN
INTRAMUSCULAR | Status: DC | PRN
  Administered 2015-07-29: 2 mg via INTRAVENOUS
  Administered 2015-07-29: 1 mg via INTRAVENOUS
  Administered 2015-07-29: 2 mg via INTRAVENOUS

## 2015-07-29 NOTE — Progress Notes (Signed)
Patient is a 55 year old woman with a history of upper GI bleed on Eliquis on 07/14/15, blood loss anemia, hepatitis C cirrhosis, SMV thrombosis, esophageal varices, status post gastrojejunostomy, and DM 2, who was admitted this morning by Dr. Maudie Mercury for recurrent black tarry stools and bright red blood per rectum. She was briefly seen and examined. Her chart, vital signs, laboratory studies were reviewed. Agree with current management with additions below.  1. Will transfuse 1 unit of packed red blood cells. Will follow hemoglobin and hematocrit. 2. Will change IV Lasix to 20 mg by mouth twice a day. Continue gentle IV fluids as her blood pressure is on the low-normal side. 3. GI consult pending, will defer starting liquids to GI.

## 2015-07-29 NOTE — OR Nursing (Signed)
Givens capsule dropped @1515 . Patient may have clear liquids after 2 hours, and a small snack after 4 hours.Dr. Laural Golden would like for nurse to capture picture if liquid stool is seen on givens device.  Dr. Otelia Limes number is [phone]

## 2015-07-29 NOTE — H&P (Addendum)
TRH H&P   Patient Demographics:    [name], is a 55 y.o. female  MRN: [id number]   DOB - 25-Oct-1960  Admit Date - 07/28/2015  Outpatient Primary MD for the patient is Glo Herring., MD  Referring MD/NP/PA: Caryl Ada  Outpatient Specialists: Dr. Laural Golden  Patient coming from: home  Chief Complaint  Patient presents with  . GI Bleeding      HPI:    [name]  is a 55 y.o. 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 Started off as black stool and then became bright red.   Pt notes recently having gi bleeding on coumadin, and then was started on Eliquis, and now presents with gi bleeding  In ED found to be anemic with Hgb of 9.3  Heme positive.  Pt will be admitted for possible GI bleed    Review of systems:    In addition to the HPI above,  No Fever-chills, No Headache, No changes with Vision or hearing, No problems swallowing food or Liquids, No Chest pain, Cough or Shortness of Breath,  No Nausea or Vommitting,No Blood Urine,  No dysuria, No new skin rashes or bruises, No new joints pains-aches,  No new weakness, tingling, numbness in any extremity, No recent weight gain or loss, No polyuria, polydypsia or polyphagia, No significant Mental Stressors.  A full 10 point Review of Systems was done, except as stated above, all other Review of Systems were negative.   With Past History of the following :    Past Medical History  Diagnosis Date  . Esophageal varices ([place])   . 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 Ascites   . Anemia   . Liver failure ([place])     "I'm on liver transplant list @ [facility]" (11/07/2012)  . Portal vein thrombosis   . Complication of  anesthesia   . PONV (postoperative nausea and vomiting)   . Heart murmur   . Type II diabetes mellitus ([place])     "not since gastric bypass" (11/07/2012)  . Hepatitis C     Hx: of Hep "C" it was eradicated  . WFUXNATF(573.2)     "monthly" (11/07/2012)  . Anxiety   . Depression   . Kidney stones   . Renal insufficiency       Past Surgical History  Procedure Laterality Date  . Abdominal hysterectomy  2003  . Upper gastrointestinal endoscopy    . Colonoscopy    . Gastric bypass  ~ 1995  . Cesarean section  1995  . Lithotripsy      "several times" (11/07/2012)  . Knee arthroscopy Right   . Colonoscopy  10/21/2011    Procedure: COLONOSCOPY;  Surgeon: Rogene Houston, MD;  [facility];  Service: Endoscopy;  Laterality: N/A;  245   . Cystoscopy with stent placement  12/08/2011    Procedure: CYSTOSCOPY WITH STENT PLACEMENT;  Surgeon: Marissa Nestle, MD;  Location: [facility];  Service: Urology;  Laterality: Right;  . Cystoscopy w/ retrogrades  12/08/2011    Procedure: CYSTOSCOPY WITH RETROGRADE PYELOGRAM;  Surgeon: Marissa Nestle, MD;  Location: [facility];  Service: Urology;  Laterality: Right;  . Dilation and curettage of uterus    . Orif distal radius fracture Right 11/07/2012  . Cholecystectomy  1987  . Hernia repair  04/14/11    "one in my bellybutton" (11/07/2012)  . Inguinal hernia repair Right     "maybe 2" (11/07/2012)  . Open reduction internal fixation (orif) distal radial fracture Right 11/07/2012    Procedure: OPEN REDUCTION INTERNAL FIXATION (ORIF) RIGHT DISTAL RADIAL FRACTURE;  Surgeon: [name], MD;  Location: [place];  Service: Orthopedics;  Laterality: Right;  . Esophagogastroduodenoscopy N/A 02/19/2014    Procedure: ESOPHAGOGASTRODUODENOSCOPY (EGD);  Surgeon: Rogene Houston, MD;  [facility];  Service: Endoscopy;  Laterality: N/A;  100  . Esophagogastroduodenoscopy N/A 07/15/2015    Procedure: ESOPHAGOGASTRODUODENOSCOPY (EGD);  Surgeon: Rogene Houston, MD;  [facility];  Service: Endoscopy;  Laterality: N/A;      Social History:     Social History  Substance Use Topics  . Smoking status: Former Smoker -- 0.50 packs/day for 5 years    Types: Cigarettes    Quit date: 05/02/1991  . Smokeless tobacco: Never Used  . Alcohol Use: No     Lives - at home w son Mobility - walks unassisted    Family History :     Family History  Problem Relation Age of Onset  . Dementia Mother   . Heart disease Father   . Liver disease Father   . Hypertension Father   . Diabetes Father   . Dementia Father   . Hypertension Brother   . Other Son     Cervical dystonia      Home Medications:   Prior to Admission medications   Medication Sig Start Date End Date Taking? Authorizing Provider  b complex vitamins tablet Take 1 tablet by mouth daily.   Yes Historical Provider, MD  baclofen (LIORESAL) 10 MG tablet Take 1 tablet (10 mg total) by mouth 3 (three) times daily as needed for muscle spasms (alternates with baclofen). 05/13/14  Yes Rogene Houston, MD  cyclobenzaprine (FLEXERIL) 10 MG tablet Take 1 tablet (10 mg total) by mouth 3 (three) times daily as needed for muscle spasms. As needed 08/15/14  Yes Rogene Houston, MD  ELIQUIS 2.5 MG TABS tablet Take 2.5 mg by mouth 2 (two) times daily. 07/09/15  Yes Historical Provider, MD  escitalopram (LEXAPRO) 10 MG tablet Take 10 mg by mouth daily. 07/24/15  Yes Historical Provider, MD  ferrous sulfate 325 (65 FE) MG tablet Take 325 mg by mouth 2 (two) times daily with a meal.   Yes Historical Provider, MD  furosemide (LASIX) 40 MG tablet Take 100 mg by mouth daily.    Yes Historical Provider, MD  LANTUS SOLOSTAR 100 UNIT/ML Solostar Pen Inject 20 Units into the skin every evening.  08/27/14  Yes Historical Provider, MD  levofloxacin (LEVAQUIN) 500 MG tablet TAKE (1) TABLET BY MOUTH ONCE DAILY. 07/17/15  Yes Samuella Cota, MD  Multiple Vitamin (MULTIVITAMIN WITH MINERALS) TABS tablet Take  1 tablet by  mouth daily.   Yes Historical Provider, MD  pantoprazole (PROTONIX) 40 MG tablet Take 1 tablet (40 mg total) by mouth 2 (two) times daily before a meal. 07/17/15  Yes Samuella Cota, MD  rifaximin (XIFAXAN) 550 MG TABS tablet Take 1 tablet (550 mg total) by mouth 2 (two) times daily. 05/13/14  Yes Rogene Houston, MD  spironolactone (ALDACTONE) 100 MG tablet Take 300 mg by mouth daily.    Yes Historical Provider, MD  sucralfate (CARAFATE) 1 g tablet Take 1 g by mouth 4 (four) times daily. 07/17/15  Yes Historical Provider, MD  traZODone (DESYREL) 50 MG tablet Take 1 tablet (50 mg total) by mouth at bedtime. 08/15/14  Yes Rogene Houston, MD  VENTOLIN HFA 108 (90 Base) MCG/ACT inhaler Inhale 1-2 puffs into the lungs every 6 (six) hours as needed for wheezing.  05/01/15  Yes Historical Provider, MD  lactulose (CHRONULAC) 10 GM/15ML solution Take 45 mLs (30 g total) by mouth 3 (three) times daily. Patient not taking: Reported on 07/14/2015 06/03/13   Erline Hau, MD  sucralfate (CARAFATE) 1 GM/10ML suspension Take 10 mLs (1 g total) by mouth 4 (four) times daily -  with meals and at bedtime. Patient not taking: Reported on 07/28/2015 07/17/15   Samuella Cota, MD     Allergies:     Allergies  Allergen Reactions  . Ancef [Cefazolin Sodium] Other (See Comments)    "Blisters in mouth and vagina" Tolerates Penicillins  . Gadobenate Itching    Nausea and vomiting reported by patient post injection of multihance.  Pt states itching and throat redness after receiving MRI contrast.  . Iodinated Diagnostic Agents Rash    Patient reports itching, nausea and vomiting.  . Tape Rash    Blisters skin.     Physical Exam:   Vitals  Blood pressure 121/56, pulse 88, temperature 98.1 F (36.7 C), temperature source Oral, resp. rate 18, height '5\' 6"'$  (1.676 m), weight 64.411 kg (142 lb), SpO2 99 %.   1. General lying in bed in NAD,  pale  2. Normal affect and insight, Not Suicidal or  Homicidal, Awake Alert, Oriented X 3.  3. No F.N deficits, ALL C.Nerves Intact, Strength 5/5 all 4 extremities, Sensation intact all 4 extremities, Plantars down going.  4. Ears and Eyes appear Normal, Conjunctivae clear, PERRLA. Moist Oral Mucosa.  5. Supple Neck, No JVD, No cervical lymphadenopathy appriciated, No Carotid Bruits.  6. Symmetrical Chest wall movement, Good air movement bilaterally, CTAB.  7. RRR, No Gallops, Rubs or Murmurs, No Parasternal Heave.  8. Positive Bowel Sounds, Abdomen Soft, slightly distended, No tenderness, No organomegaly appriciated,No rebound -guarding or rigidity.  9.  No Cyanosis, Normal Skin Turgor, No Skin Rash or Bruise.  10. Good muscle tone,  joints appear normal , no effusions, Normal ROM.  11. No Palpable Lymph Nodes in Neck or Axillae   no spider, no telangietasia, no palmar erythema,     Data Review:    CBC  Recent Labs Lab 07/28/15 2230  WBC 4.2  HGB 9.3*  HCT 28.7*  PLT 60*  MCV 76.3*  MCH 24.7*  MCHC 32.4  RDW 17.0*   ------------------------------------------------------------------------------------------------------------------  Chemistries   Recent Labs Lab 07/28/15 2230  NA 132*  K 3.5  CL 99*  CO2 26  GLUCOSE 191*  BUN 18  CREATININE 1.27*  CALCIUM 8.3*  AST 26  ALT 31  ALKPHOS 65  BILITOT 0.9   ------------------------------------------------------------------------------------------------------------------ estimated creatinine clearance  is 48 mL/min (by C-G formula based on Cr of 1.27). ------------------------------------------------------------------------------------------------------------------ No results for input(s): TSH, T4TOTAL, T3FREE, THYROIDAB in the last 72 hours.  Invalid input(s): FREET3  Coagulation profile  Recent Labs Lab 07/28/15 2230  INR 1.62*   ------------------------------------------------------------------------------------------------------------------- No  results for input(s): DDIMER in the last 72 hours. -------------------------------------------------------------------------------------------------------------------  Cardiac Enzymes No results for input(s): CKMB, TROPONINI, MYOGLOBIN in the last 168 hours.  Invalid input(s): CK ------------------------------------------------------------------------------------------------------------------ No results found for: BNP   ---------------------------------------------------------------------------------------------------------------  Urinalysis    Component Value Date/Time   COLORURINE YELLOW 07/14/2015 [place] 07/14/2015 1545   LABSPEC 1.020 07/14/2015 1545   PHURINE 5.5 07/14/2015 1545   GLUCOSEU NEGATIVE 07/14/2015 1545   HGBUR NEGATIVE 07/14/2015 [place] 07/14/2015 1545   [place] 07/14/2015 1545   PROTEINUR NEGATIVE 07/14/2015 1545   UROBILINOGEN 0.2 05/28/2013 0206   NITRITE NEGATIVE 07/14/2015 1545   LEUKOCYTESUR TRACE* 07/14/2015 1545    ----------------------------------------------------------------------------------------------------------------   Imaging Results:    No results found.     Assessment & Plan:    Principal Problem:   Anemia Active Problems:   Thrombocytopenia (HCC)   Melena   Hyponatremia   Hypokalemia    1. Gi bleeding Heme positive stool protonix '80mg'$  iv and '8mg'$  / hour iv NPO except for meds and ice chips  2. Anemia Check cbc in am Check ferritin, b12,folate, esr  3. Hyponatremia likely due to cirrhosis Hydrate gently with ns iv  4. Renal insufficiency Check cmp in am  5. Portal vein thrombosis, superior mesenteric vein thrombosis Hold eliquis in light of c/o blood in stool.   DVT Prophylaxis SCDs   AM Labs Ordered, also please review Full Orders  Family Communication: Admission, patients condition and plan of care including tests being ordered have been discussed with the  patient  who indicate understanding and agree with the plan and Code Status.  Code Status FULL CODE  Likely DC to  rehab  Condition GUARDED    Consults called: GI consultation ordered  Admission status: inpatient  Time spent in minutes : 45 minutes   Jani Gravel M.D on 07/29/2015 at 12:01 AM  Between 7am to 7pm - Pager - [phone]. After 7pm go to [email or web address] - password [facility]  [facility] - Office  [phone]

## 2015-07-29 NOTE — Consult Note (Signed)
Reason for Consult: GI bleed Referring Physician: Hospitalist  [name] is an 55 y.o. female.  HPI: admitted thru the ED yesterday with GI bleed.  States she started passing dark stools yesterday which gradually became bright red.  Started Eliquis on Friday the 16th. Had been on Coumadin for SMV thrombosis Hx of NAFLD/Hepatitis C. Ascites. Will receive 1 unit of blood today. Patient had been listed for liver transplant for several months but taken off the list because of persistent SMV thrombosis. Patient was advised to discontinue warfarin and was to begin Eliquis on 07/11/2015.   No BM since admission. Patient had been listed for liver transplant  but taken off the list because of persistent SMV thrombosis. Her last colonoscopy was in January of this year at [facility].  Finding include non-thrombosed external hemorrhoids. 1 13 mm polyp was found in the ascending colon. Polyp was sessile. The polyp was removed with hot snare. A 26m polyp was found in the descending colon. Polyp was sessile.  Removed with hot snare.  Multiple small mouthed diverticula in the sigmoid colon and in the descending colon. Non bleeding internal hemorrhoids were found during retroflexion. The hemorrhoids were large.  07/15/2015 EGD:   Impression: - Grade I esophageal varices.  - Z-line regular, 35 cm from the incisors.  - Roux-en-Y gastrojejunostomy with gastrojejunal   anastomosis characterized by ulceration without   stigmata of bleed.  - Congested (edematous) jejunum.  - No specimens collected.          Past Medical History  Diagnosis Date  . Esophageal varices ([place]   . Cirrhosis ([place]     non alcoholic  . Ascites   . Anemia   . Liver failure ([place]     "I'm on liver transplant list @ [facility]" (11/07/2012)  . Portal vein  thrombosis   . Complication of anesthesia   . PONV (postoperative nausea and vomiting)   . Heart murmur   . Type II diabetes mellitus ([place]     "not since gastric bypass" (11/07/2012)  . Hepatitis C     Hx: of Hep "C" it was eradicated  . HYYTKPTWS(568.1     "monthly" (11/07/2012)  . Anxiety   . Depression   . Kidney stones   . Renal insufficiency   . GI bleed 07/17/2015  . History of hepatitis C - successfuly treated medically 07/14/2015  . 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 Gram-negative bacteremia ([place] 12/08/2011  . Gastric ulcer 07/15/2015    per EGD; no stigmata of bleeding  . Diabetes mellitus type 2, controlled, without complications ([place]   . Right ureteral stone 12/07/2011    Past Surgical History  Procedure Laterality Date  . Abdominal hysterectomy  2003  . Upper gastrointestinal endoscopy    . Colonoscopy    . Gastric bypass  ~ 1995  . Cesarean section  1995  . Lithotripsy      "several times" (11/07/2012)  . Knee arthroscopy Right   . Colonoscopy  10/21/2011    Procedure: COLONOSCOPY;  Surgeon: NRogene Houston MD;  [facility];  Service: Endoscopy;  Laterality: N/A;  245   . Cystoscopy with stent placement  12/08/2011    Procedure: CYSTOSCOPY WITH STENT PLACEMENT;  Surgeon: MMarissa Nestle MD;  [facility];  Service: Urology;  Laterality: Right;  . Cystoscopy w/ retrogrades  12/08/2011    Procedure: CYSTOSCOPY WITH RETROGRADE PYELOGRAM;  Surgeon: MMarissa Nestle MD;  [facility];  Service: Urology;  Laterality: Right;  . Dilation and curettage of uterus    . Orif distal radius fracture Right 11/07/2012  . Cholecystectomy  1987  . Hernia repair  04/14/11    "one in my bellybutton" (11/07/2012)  . Inguinal hernia repair Right     "maybe 2" (11/07/2012)  . Open reduction internal fixation (orif) distal radial fracture Right 11/07/2012    Procedure: OPEN REDUCTION INTERNAL FIXATION (ORIF) RIGHT DISTAL RADIAL FRACTURE;   Surgeon: [name], MD;  Location: [place];  Service: Orthopedics;  Laterality: Right;  . Esophagogastroduodenoscopy N/A 02/19/2014    Procedure: ESOPHAGOGASTRODUODENOSCOPY (EGD);  Surgeon: Rogene Houston, MD;  [facility];  Service: Endoscopy;  Laterality: N/A;  100  . Esophagogastroduodenoscopy N/A 07/15/2015    Procedure: ESOPHAGOGASTRODUODENOSCOPY (EGD);  Surgeon: Rogene Houston, MD;  [facility];  Service: Endoscopy;  Laterality: N/A;    Family History  Problem Relation Age of Onset  . Dementia Mother   . Heart disease Father   . Liver disease Father   . Hypertension Father   . Diabetes Father   . Dementia Father   . Hypertension Brother   . Other Son     Cervical dystonia    Social History:  reports that she quit smoking about 24 years ago. Her smoking use included Cigarettes. She has a 2.5 pack-year smoking history. She has never used smokeless tobacco. She reports that she does not drink alcohol or use illicit drugs.  Allergies:  Allergies  Allergen Reactions  . Ancef [Cefazolin Sodium] Other (See Comments)    "Blisters in mouth and vagina" Tolerates Penicillins  . Gadobenate Itching    Nausea and vomiting reported by patient post injection of multihance.  Pt states itching and throat redness after receiving MRI contrast.  . Iodinated Diagnostic Agents Rash    Patient reports itching, nausea and vomiting.  . Tape Rash    Blisters skin.    Medications: I have reviewed the patient's current medications.  Results for orders placed or performed during the hospital encounter of 07/28/15 (from the past 48 hour(s))  Comprehensive metabolic panel     Status: Abnormal   Collection Time: 07/28/15 10:30 PM  Result Value Ref Range   Sodium 132 (L) 135 - 145 mmol/L   Potassium 3.5 3.5 - 5.1 mmol/L   Chloride 99 (L) 101 - 111 mmol/L   CO2 26 22 - 32 mmol/L   Glucose, Bld 191 (H) 65 - 99 mg/dL   BUN 18 6 - 20 mg/dL   Creatinine, Ser 1.27 (H) 0.44 -  1.00 mg/dL   Calcium 8.3 (L) 8.9 - 10.3 mg/dL   Total Protein 6.4 (L) 6.5 - 8.1 g/dL   Albumin 3.2 (L) 3.5 - 5.0 g/dL   AST 26 15 - 41 U/L   ALT 31 14 - 54 U/L   Alkaline Phosphatase 65 38 - 126 U/L   Total Bilirubin 0.9 0.3 - 1.2 mg/dL   GFR calc non Af Amer 47 (L) >60 mL/min   GFR calc Af Amer 55 (L) >60 mL/min    Comment: (NOTE) The eGFR has been calculated using the CKD EPI equation. This calculation has not been validated in all clinical situations. eGFR's persistently <60 mL/min signify possible Chronic Kidney Disease.    Anion gap 7 5 - 15  CBC     Status: Abnormal   Collection Time: 07/28/15 10:30 PM  Result Value Ref Range   WBC 4.2 4.0 -  10.5 K/uL   RBC 3.76 (L) 3.87 - 5.11 MIL/uL   Hemoglobin 9.3 (L) 12.0 - 15.0 g/dL   HCT 28.7 (L) 36.0 - 46.0 %   MCV 76.3 (L) 78.0 - 100.0 fL   MCH 24.7 (L) 26.0 - 34.0 pg   MCHC 32.4 30.0 - 36.0 g/dL   RDW 17.0 (H) 11.5 - 15.5 %   Platelets 60 (L) 150 - 400 K/uL    Comment: SPECIMEN CHECKED FOR CLOTS PLATELET COUNT CONFIRMED BY SMEAR   Type and screen [facility]     Status: None (Preliminary result)   Collection Time: 07/28/15 10:30 PM  Result Value Ref Range   ABO/RH(D) B POS    Antibody Screen NEG    Sample Expiration 07/31/2015    Unit Number [id number]    Blood Component Type RBC LR PHER2    Unit division 00    Status of Unit ALLOCATED    Transfusion Status OK TO TRANSFUSE    Crossmatch Result Compatible    Unit Number [id number]    Blood Component Type RBC LR PHER1    Unit division 00    Status of Unit ALLOCATED    Transfusion Status OK TO TRANSFUSE    Crossmatch Result Compatible   Protime-INR - (order if Patient is taking Coumadin / Warfarin)     Status: Abnormal   Collection Time: 07/28/15 10:30 PM  Result Value Ref Range   Prothrombin Time 19.2 (H) 11.6 - 15.2 seconds   INR 1.62 (H) 0.00 - 1.49  Prepare RBC     Status: None   Collection Time: 07/28/15 10:30 PM  Result Value Ref Range   Order  Confirmation ORDER PROCESSED BY BLOOD BANK   POC occult blood, ED     Status: Abnormal   Collection Time: 07/28/15 10:37 PM  Result Value Ref Range   Fecal Occult Bld POSITIVE (A) NEGATIVE  MRSA PCR Screening     Status: None   Collection Time: 07/29/15  1:46 AM  Result Value Ref Range   MRSA by PCR NEGATIVE NEGATIVE    Comment:        The GeneXpert MRSA Assay (FDA approved for NASAL specimens only), is one component of a comprehensive MRSA colonization surveillance program. It is not intended to diagnose MRSA infection nor to guide or monitor treatment for MRSA infections.   CBC     Status: Abnormal   Collection Time: 07/29/15  4:17 AM  Result Value Ref Range   WBC 2.8 (L) 4.0 - 10.5 K/uL   RBC 3.29 (L) 3.87 - 5.11 MIL/uL   Hemoglobin 8.0 (L) 12.0 - 15.0 g/dL   HCT 25.2 (L) 36.0 - 46.0 %   MCV 76.6 (L) 78.0 - 100.0 fL   MCH 24.3 (L) 26.0 - 34.0 pg   MCHC 31.7 30.0 - 36.0 g/dL   RDW 16.9 (H) 11.5 - 15.5 %   Platelets 58 (L) 150 - 400 K/uL    Comment: SPECIMEN CHECKED FOR CLOTS CONSISTENT WITH PREVIOUS RESULT   Comprehensive metabolic panel     Status: Abnormal   Collection Time: 07/29/15  4:17 AM  Result Value Ref Range   Sodium 134 (L) 135 - 145 mmol/L   Potassium 3.7 3.5 - 5.1 mmol/L   Chloride 101 101 - 111 mmol/L   CO2 27 22 - 32 mmol/L   Glucose, Bld 123 (H) 65 - 99 mg/dL   BUN 17 6 - 20 mg/dL   Creatinine, Ser 1.16 (H) 0.44 -  1.00 mg/dL   Calcium 7.9 (L) 8.9 - 10.3 mg/dL   Total Protein 5.3 (L) 6.5 - 8.1 g/dL   Albumin 2.6 (L) 3.5 - 5.0 g/dL   AST 22 15 - 41 U/L   ALT 26 14 - 54 U/L   Alkaline Phosphatase 52 38 - 126 U/L   Total Bilirubin 1.0 0.3 - 1.2 mg/dL   GFR calc non Af Amer 53 (L) >60 mL/min   GFR calc Af Amer >60 >60 mL/min    Comment: (NOTE) The eGFR has been calculated using the CKD EPI equation. This calculation has not been validated in all clinical situations. eGFR's persistently <60 mL/min signify possible Chronic Kidney Disease.     Anion gap 6 5 - 15  Glucose, capillary     Status: Abnormal   Collection Time: 07/29/15  4:34 AM  Result Value Ref Range   Glucose-Capillary 122 (H) 65 - 99 mg/dL  Glucose, capillary     Status: Abnormal   Collection Time: 07/29/15  7:37 AM  Result Value Ref Range   Glucose-Capillary 116 (H) 65 - 99 mg/dL   Comment 1 Notify RN    Comment 2 Document in Chart     No results found.  ROS Blood pressure 89/55, pulse 65, temperature 98.2 F (36.8 C), temperature source Oral, resp. rate 12, height 5' 6" (1.676 m), weight 143 lb 1.3 oz (64.9 kg), SpO2 98 %. Physical Exam Alert and oriented. Skin warm and dry. Oral mucosa is moist.   . Sclera anicteric, conjunctivae is pink. Thyroid not enlarged. No cervical lymphadenopathy. Lungs clear. Murmur heard.  Abdomen is soft. Bowel sounds are positive. No hepatomegaly. Large easily palpable spleen. No abdominal masses felt. No tenderness.  No edema to lower extremities.    Assessment/Plan: Patient is 55 year old Caucasian female with advanced cirrhosis who has history of SMV thrombosis and has been on warfarin until about 3 weeks ago. She presented 2 weeks ago with melena. EGD revealed small anastomotic ulcer without stigmata of bleed. Patient was started on Eliquis. She has received 1 unit of PRBCs. She could be bleeding from upper GI tract small bowel or right side of the colon. Since anastomotic ulcer was found on her last EGD this area needs to be evaluated. If indeed she has stigmata of GI bleed she will not need further workup.   SETZER,TERRI W 07/29/2015, 8:46 AM   GI attending note: Patient interviewed and examined. Recurrent GI bleed in a patient with decompensated chronic liver disease who is on anticoagulant for SMV thrombosis. Since she has history of anastomotic ulcer upper GI tract would be examined first. If anastomotic ulcer has healed will proceed with small bowel given capsule study. Patient states she feels tired and not sure if she  wants to keep going.

## 2015-07-29 NOTE — Progress Notes (Signed)
EGD revealed 3 columns of grade 1 esophageal varices, esophageal scarring from previous banding, small gastric pouch with patent gastrojejunostomy but healed ulcer. Diffuse edema to jejunal mucosa. Given capsule placed into proximal jejunum just past gastrojejunostomy. Will review study tomorrow morning.

## 2015-07-29 NOTE — ED Notes (Signed)
Pt updated on plan of care, denies any complaints,  

## 2015-07-29 NOTE — Op Note (Addendum)
[facility] Patient Name: Michaline Fixler Procedure Date: 07/29/2015 4:51 PM MRN: [id number] Date of Birth: 05-30-1960 Attending MD: Hildred Laser , MD CSN: [id number] Age: 55 Admit Type: Inpatient Procedure:                Upper GI endoscopy Indications:              Melena Providers:                Hildred Laser, MD, Janeece Riggers, RN, Georgeann Oppenheim,                            Technician Referring MD:             Arlyss Repress, MD Medicines:                Cetacaine spray, Meperidine 25 mg IV, Midazolam 5                            mg IV Complications:            No immediate complications. Estimated Blood Loss:     Estimated blood loss: none. Estimated blood loss:                            none. Procedure:                Pre-Anesthesia Assessment:                           - Prior to the procedure, a History and Physical                            was performed, and patient medications and                            allergies were reviewed. The patient's tolerance of                            previous anesthesia was also reviewed. The risks                            and benefits of the procedure and the sedation                            options and risks were discussed with the patient.                            All questions were answered, and informed consent                            was obtained. Prior Anticoagulants: The patient                            last took Eliquis (apixaban) 1 day prior to the                            procedure.  ASA Grade Assessment: III - A patient                            with severe systemic disease. After reviewing the                            risks and benefits, the patient was deemed in                            satisfactory condition to undergo the procedure.                           After obtaining informed consent, the endoscope was                            passed under direct vision. Throughout the   procedure, the patient's blood pressure, pulse, and                            oxygen saturations were monitored continuously. The                            [id number] scope was introduced through the                            mouth, and advanced to the proximal jejunum. The                            upper GI endoscopy was accomplished without                            difficulty. The patient tolerated the procedure                            well. Scope In: 5:00:26 PM Scope Out: 5:15:03 PM Total Procedure Duration: 0 hours 14 minutes 37 seconds  Findings:      The upper third of the esophagus and middle third of the esophagus were       normal.      Grade I varices were found in the lower third of the esophagus.      The Z-line was regular and was found 37 cm from the incisors.      A from previous banding. post variceal banding scar was found in the       lower third of the esophagus. The scar tissue was healthy in appearance.      Evidence of a Roux-en-Y gastrojejunostomy was found. The gastrojejunal       anastomosis was characterized by congestion and erythema. This was       traversed. The pouch-to-jejunum limb was characterized by edema and       erythema. The duodenum-to-jejunum limb was not examined as it could not       be found.      Diffuse mildly congested mucosa without active bleeding and with no       stigmata of bleeding was found in the jejunum.      Small scar distal to  anastomosis indicated above healed ulcer.      Using the endoscope, the video capsule enteroscope was advanced into the       proximal jejunum. Impression:               - Normal upper third of esophagus and middle third                            of esophagus.                           - Grade I esophageal varices small to be banded..                           - Z-line regular, 37 cm from the incisors.                           - Scarring in the lower third of the esophagus from                             previous banding                           - Roux-en-Y gastrojejunostomy with gastrojejunal                            anastomosis characterized by congestion and                            erythema.                           - small scar distal to anastomosis indicated above                            healed ulcer.                           - Congested (edematous) jejunum.                           - Using the endoscope, the video capsule                            enteroscope was advanced into the proximal jejunum.                           - No specimens collected. Moderate Sedation:      Moderate (conscious) sedation was administered by the endoscopy nurse       and supervised by the endoscopist. The following parameters were       monitored: oxygen saturation, heart rate, blood pressure, CO2       capnography and response to care. Total physician intraservice time was       14 minutes. Recommendation:           - Return patient to ICU for ongoing care.                           -  NPO for 2 hours.                           - Continue present medications. Procedure Code(s):        --- Professional ---                           [phone], Esophagogastroduodenoscopy, flexible,                            transoral; diagnostic, including collection of                            specimen(s) by brushing or washing, when performed                            (separate procedure)                           99152, Moderate sedation services provided by the                            same physician or other qualified health care                            professional performing the diagnostic or                            therapeutic service that the sedation supports,                            requiring the presence of an independent trained                            observer to assist in the monitoring of the                            patient's level of consciousness and  physiological                            status; initial 15 minutes of intraservice time,                            patient age 78 years or older Diagnosis Code(s):        --- Professional ---                           I85.00, Esophageal varices without bleeding                           K22.8, Other specified diseases of esophagus                           Z98.0, Intestinal bypass and anastomosis status  K63.89, Other specified diseases of intestine                           K92.1, Melena (includes Hematochezia) CPT copyright 2016 American [employer] Association. All rights reserved. The codes documented in this report are preliminary and upon coder review may  be revised to meet current compliance requirements. Hildred Laser, MD Hildred Laser, MD 07/29/2015 5:41:18 PM This report has been signed electronically. Number of Addenda: 0

## 2015-07-30 ENCOUNTER — Ambulatory Visit (INDEPENDENT_AMBULATORY_CARE_PROVIDER_SITE_OTHER): Payer: Medicare Other | Admitting: Internal Medicine

## 2015-07-30 DIAGNOSIS — K639 Disease of intestine, unspecified: Secondary | ICD-10-CM

## 2015-07-30 DIAGNOSIS — E119 Type 2 diabetes mellitus without complications: Secondary | ICD-10-CM

## 2015-07-30 DIAGNOSIS — K7460 Unspecified cirrhosis of liver: Secondary | ICD-10-CM

## 2015-07-30 DIAGNOSIS — I8510 Secondary esophageal varices without bleeding: Secondary | ICD-10-CM | POA: Diagnosis present

## 2015-07-30 DIAGNOSIS — K2970 Gastritis, unspecified, without bleeding: Secondary | ICD-10-CM | POA: Diagnosis present

## 2015-07-30 DIAGNOSIS — I9589 Other hypotension: Secondary | ICD-10-CM

## 2015-07-30 DIAGNOSIS — K257 Chronic gastric ulcer without hemorrhage or perforation: Secondary | ICD-10-CM

## 2015-07-30 DIAGNOSIS — K7031 Alcoholic cirrhosis of liver with ascites: Secondary | ICD-10-CM

## 2015-07-30 DIAGNOSIS — F418 Other specified anxiety disorders: Secondary | ICD-10-CM | POA: Diagnosis present

## 2015-07-30 DIAGNOSIS — I81 Portal vein thrombosis: Secondary | ICD-10-CM

## 2015-07-30 DIAGNOSIS — K766 Portal hypertension: Secondary | ICD-10-CM

## 2015-07-30 DIAGNOSIS — I959 Hypotension, unspecified: Secondary | ICD-10-CM | POA: Diagnosis not present

## 2015-07-30 DIAGNOSIS — D696 Thrombocytopenia, unspecified: Secondary | ICD-10-CM

## 2015-07-30 LAB — BASIC METABOLIC PANEL
Anion gap: 3 — ABNORMAL LOW (ref 5–15)
BUN: 15 mg/dL (ref 6–20)
CO2: 24 mmol/L (ref 22–32)
Calcium: 7.5 mg/dL — ABNORMAL LOW (ref 8.9–10.3)
Chloride: 107 mmol/L (ref 101–111)
Creatinine, Ser: 1.19 mg/dL — ABNORMAL HIGH (ref 0.44–1.00)
GFR calc Af Amer: 59 mL/min — ABNORMAL LOW (ref >60)
GFR, EST NON AFRICAN AMERICAN: 51 mL/min — AB (ref >60)
GLUCOSE: 91 mg/dL (ref 65–99)
POTASSIUM: 3.8 mmol/L (ref 3.5–5.1)
Sodium: 134 mmol/L — ABNORMAL LOW (ref 135–145)

## 2015-07-30 LAB — GLUCOSE, CAPILLARY
GLUCOSE-CAPILLARY: 103 mg/dL — AB (ref 65–99)
GLUCOSE-CAPILLARY: 135 mg/dL — AB (ref 65–99)
GLUCOSE-CAPILLARY: 223 mg/dL — AB (ref 65–99)
GLUCOSE-CAPILLARY: 224 mg/dL — AB (ref 65–99)
GLUCOSE-CAPILLARY: 90 mg/dL (ref 65–99)
Glucose-Capillary: 295 mg/dL — ABNORMAL HIGH (ref 65–99)

## 2015-07-30 LAB — CBC
HEMATOCRIT: 26 % — AB (ref 36.0–46.0)
Hemoglobin: 8.6 g/dL — ABNORMAL LOW (ref 12.0–15.0)
MCH: 26.1 pg (ref 26.0–34.0)
MCHC: 33.1 g/dL (ref 30.0–36.0)
MCV: 78.8 fL (ref 78.0–100.0)
PLATELETS: 51 10*3/uL — AB (ref 150–400)
RBC: 3.3 MIL/uL — AB (ref 3.87–5.11)
RDW: 17.6 % — ABNORMAL HIGH (ref 11.5–15.5)
WBC: 2.5 10*3/uL — ABNORMAL LOW (ref 4.0–10.5)

## 2015-07-30 MED ORDER — LACTULOSE 10 GM/15ML PO SOLN
30.0000 g | Freq: Two times a day (BID) | ORAL | Status: DC
  Administered 2015-07-30 – 2015-07-31 (×3): 30 g via ORAL
  Filled 2015-07-30 (×3): qty 60

## 2015-07-30 MED ORDER — CITALOPRAM HYDROBROMIDE 20 MG PO TABS
20.0000 mg | ORAL_TABLET | Freq: Every day | ORAL | Status: DC
  Administered 2015-07-30 – 2015-07-31 (×2): 20 mg via ORAL
  Filled 2015-07-30 (×2): qty 1

## 2015-07-30 MED ORDER — SODIUM CHLORIDE 0.9 % IV BOLUS (SEPSIS)
250.0000 mL | Freq: Once | INTRAVENOUS | Status: AC
  Administered 2015-07-30: 250 mL via INTRAVENOUS

## 2015-07-30 MED ORDER — PANTOPRAZOLE SODIUM 40 MG PO TBEC
40.0000 mg | DELAYED_RELEASE_TABLET | Freq: Every day | ORAL | Status: DC
  Administered 2015-07-30 – 2015-07-31 (×2): 40 mg via ORAL
  Filled 2015-07-30 (×2): qty 1

## 2015-07-30 MED ORDER — SODIUM CHLORIDE 0.9 % IV BOLUS (SEPSIS): 500.0000 mL | Freq: Once | INTRAVENOUS | Status: DC

## 2015-07-30 NOTE — Progress Notes (Signed)
Givens device removed & given to nurse supervisor Barnett Applebaum to be downloaded to video .Did not have to video abdomen due to pt did not have any BM's.

## 2015-07-30 NOTE — Progress Notes (Signed)
PROGRESS NOTE    [name]  [id number] DOB: 11-19-1960 DOA: 07/28/2015 PCP: Glo Herring., MD    Brief Narrative:  Patient is a 55 year old woman with a history of upper GI bleed on Eliquis on 07/14/15, blood loss anemia, hepatitis C cirrhosis, SMV thrombosis, esophageal varices, status post gastrojejunostomy, and DM 2, who was admitted for recurrent black tarry stools and bright red blood per rectum. She had restarted Eliquis 4 days prior to admission. In the ED, she was afebrile and hemodynamically stable. Her lab data were significant for a serum sodium of 132, creatinine 1.27, hemoglobin of 9.3, platelet count of 60, INR 1.62, and glucose of 191. Stool Hemoccult was positive. She was admitted for management of recurrent GI bleeding. Of note, patient had a colonoscopy 02/2015 at [place] which revealed nonthrombosed external hemorrhoids, colon polyps, and small diverticula.   Assessment & Plan:   Principal Problem:   GI bleeding Active Problems:   Cirrhosis of liver (HCC)   Esophageal varices in cirrhosis (HCC)   Gastritis determined by endoscopy   Acute blood loss anemia   Hyponatremia   Gastric ulcer   Thrombocytopenia (HCC)   Superior mesenteric vein thrombosis (HCC)   Leukopenia   AKI (acute kidney injury) ([place])   Diabetes mellitus type 2, controlled, without complications ([place])   Depression with anxiety   1. Recurrent GI bleeding in a patient with history of peptic ulcer disease and esophageal varices. (Of note, patient had a colonoscopy 02/2015 at [place] which revealed nonthrombosed external hemorrhoids, colon polyps, and small diverticula.) Eliquis is being withheld. Patient was started on Protonix drip and IV fluids for hydration. She was transfused 1 unit of packed red blood cells on 07/29/15. Gastroenterologist, Dr. Laural Golden was consulted. He performed an EGD. The EGD revealed esophageal varices, esophageal scarring, patent jejunostomy, healed ulcer,  and diffuse edema and erythema of the jejunal mucosa. No active bleeding noted. -Capsule study ordered and is pending. -Further advancement of diet and Protonix dosing will be deferred to gastroenterology.  Acute blood loss anemia. Patient's hemoglobin was 9.3 on admission. It fell to 8.0. In the setting of low blood pressures and GI bleeding, she was transfused 1 unit of packed rib blood cells on 07/29/15. Her hemoglobin improved appropriately. Is currently stable at 8.6. -We'll continue to monitor for further transfusions if needed.  Leukopenia and thrombocytopenia. Both likely secondary to cirrhosis and are chronic. Her TSH was within normal limits. -We'll assess her vitamin B12 level.  We'll continue to monitor her blood counts.  Hepatitis C cirrhosis with resultant coagulopathy, esophageal varices, ascites etc. Patient had been listed for liver transplant, but was taken off due to persistent SMV thrombosis. She is treated chronically with lactulose, rifaximin, spironolactone, Levaquin, and Lasix. She reports eradication of hepatitis C. -We'll continue rifaximin. We'll restart lactulose. Will hold Lasix and spironolactone due to low blood pressures. -Further management recommendations per GI.  SMV thrombosis. The patient had been treated with Coumadin in the past, but anticoagulation was changed to Eliquis. Eliquis  was stopped temporarily early June 2017 due to GI bleeding. She had restarted it before the recurrent bleeding. It is currently on hold.  Hypotension. Hypotension may be secondary to hypovolemia. IV fluids were started. Her blood pressure has improved, but has been trending in the upper [id number] systolically. Bolus of normal saline given on this morning. -We'll hold Lasix and spironolactone.  Type II insulin requiring diabetes. Patient is treated with Lantus chronically; currently on hold. Sliding scale NovoLog  started. Her CBGs have been controlled. -We'll check hemoglobin  A1c.  Acute kidney injury. Patient's creatinine was 1.27 on admission. It was within normal limits 3 weeks ago. She was started on IV fluids. Her creatinine has improved. We'll continue to monitor.  Hyponatremia. Patient's serum sodium was 132 on admission. She was started on normal saline IV fluids. Her serum sodium has improved. We'll continue to monitor.   DVT prophylaxis: SCDs Code Status: Full code Family Communication: Family not available Disposition Plan: Discharge home when clinically appropriate   Consultants:   Gastroenterology, Dr. Laural Golden  Procedures:  -Capsule study 6/21-6/22/17 -EGD 07/29/15 per Dr. Laural Golden : EGD revealed 3 columns of grade 1 esophageal varices, esophageal scarring from previous banding, small gastric pouch with patent gastrojejunostomy but healed ulcer. Diffuse edema to jejunal mucosa. -Transfusion of 1 unit of packed blood cells on 07/29/15.  Antimicrobials:   Chronic Levaquin started on admission    Subjective: Patient denies bowel movement with blood. She denies nausea vomiting. She has mild diffuse abdominal discomfort. She does feel a little dizzy when she sits up. She denies chest pain or shortness of breath.  Objective: Filed Vitals:   07/30/15 0500 07/30/15 0530 07/30/15 0600 07/30/15 0619  BP: 87/49 83/48 81/47  89/52  Pulse: 65 70 72 68  Temp:      TempSrc:      Resp: 13 14 14 15   Height:      Weight:      SpO2: 95% 95% 94% 94%    Intake/Output Summary (Last 24 hours) at 07/30/15 0739 Last data filed at 07/30/15 0600  Gross per 24 hour  Intake 3096.68 ml  Output    700 ml  Net 2396.68 ml   Filed Weights   07/28/15 2032 07/29/15 0300 07/30/15 0401  Weight: 64.411 kg (142 lb) 64.9 kg (143 lb 1.3 oz) 66.5 kg (146 lb 9.7 oz)    Examination:  General exam: Appears calm and comfortable; Small framed 55 year old woman in no acute distress.  Respiratory system: Clear to auscultation. Respiratory effort normal. Cardiovascular  system: S1 & S2 auscultated. No JVD, murmurs, rubs, gallops or clicks. No pedal edema. Gastrointestinal system: Abdomen is with mild fluid wave, soft and mildly diffusely tender. No rigidity or distention. No organomegaly or masses felt. Normal bowel sounds heard. Central nervous system: Alert and oriented. No focal neurological deficits. Extremities: Symmetric 5 x 5 power. Skin: No rashes, lesions or ulcers Psychiatry: Judgement and insight appear normal. Mood & affect appropriate.     Data Reviewed: I have personally reviewed following labs and imaging studies  CBC:  Recent Labs Lab 07/28/15 2230 07/29/15 0417 07/29/15 1325 07/30/15 0413  WBC 4.2 2.8* 3.5* 2.5*  HGB 9.3* 8.0* 8.9* 8.6*  HCT 28.7* 25.2* 27.9* 26.0*  MCV 76.3* 76.6* 78.6 78.8  PLT 60* 58* 58* 51*   Basic Metabolic Panel:  Recent Labs Lab 07/28/15 2230 07/29/15 0417 07/30/15 0413  NA 132* 134* 134*  K 3.5 3.7 3.8  CL 99* 101 107  CO2 26 27 24   GLUCOSE 191* 123* 91  BUN 18 17 15   CREATININE 1.27* 1.16* 1.19*  CALCIUM 8.3* 7.9* 7.5*   GFR: Estimated Creatinine Clearance: 51.2 mL/min (by C-G formula based on Cr of 1.19). Liver Function Tests:  Recent Labs Lab 07/28/15 2230 07/29/15 0417  AST 26 22  ALT 31 26  ALKPHOS 65 52  BILITOT 0.9 1.0  PROT 6.4* 5.3*  ALBUMIN 3.2* 2.6*   No results for input(s): LIPASE, AMYLASE  in the last 168 hours. No results for input(s): AMMONIA in the last 168 hours. Coagulation Profile:  Recent Labs Lab 07/28/15 2230  INR 1.62*   Cardiac Enzymes: No results for input(s): CKTOTAL, CKMB, CKMBINDEX, TROPONINI in the last 168 hours. BNP (last 3 results) No results for input(s): PROBNP in the last 8760 hours. HbA1C: No results for input(s): HGBA1C in the last 72 hours. CBG:  Recent Labs Lab 07/29/15 1137 07/29/15 1614 07/29/15 2003 07/29/15 2333 07/30/15 0356  GLUCAP 118* 102* 82 78 103*   Lipid Profile: No results for input(s): CHOL, HDL, LDLCALC,  TRIG, CHOLHDL, LDLDIRECT in the last 72 hours. Thyroid Function Tests:  Recent Labs  07/29/15 1325  TSH 2.403   Anemia Panel: No results for input(s): VITAMINB12, FOLATE, FERRITIN, TIBC, IRON, RETICCTPCT in the last 72 hours. Sepsis Labs: No results for input(s): PROCALCITON, LATICACIDVEN in the last 168 hours.  Recent Results (from the past 240 hour(s))  MRSA PCR Screening     Status: None   Collection Time: 07/29/15  1:46 AM  Result Value Ref Range Status   MRSA by PCR NEGATIVE NEGATIVE Final    Comment:        The GeneXpert MRSA Assay (FDA approved for NASAL specimens only), is one component of a comprehensive MRSA colonization surveillance program. It is not intended to diagnose MRSA infection nor to guide or monitor treatment for MRSA infections.          Radiology Studies: No results found.      Scheduled Meds: . furosemide  20 mg Oral BID  . insulin aspart  0-9 Units Subcutaneous TID WC  . levofloxacin  500 mg Oral Daily  . rifaximin  550 mg Oral BID  . sodium chloride flush  3 mL Intravenous Q12H  . traZODone  50 mg Oral QHS   Continuous Infusions: . sodium chloride    . 0.9 % NaCl with KCl 20 mEq / L 100 mL/hr at 07/30/15 0600  . pantoprozole (PROTONIX) infusion 8 mg/hr (07/30/15 0600)     LOS: 1 day    Time spent: 42 minutes    Rexene Alberts, MD [facility] Pager [phone]  If 7PM-7AM, please contact night-coverage [email or web address] Password [facility] 07/30/2015, 7:39 AM

## 2015-07-30 NOTE — Op Note (Signed)
Small Bowel Givens Capsule Study Procedure date:  07/29/2015  Referring Provider:  Arlys John, MD PCP:  Dr. Glo Herring., MD  Indication for procedure: Patient is 55 year old Caucasian female with decompensated cirrhosis who presents with recurrent melena and anemia. She was found to have a very small anastomotic ulcer on EGD of 07/15/2015. On EGD also was noted to have healed. No other bleeding lesion was identified. Therefore given capsule was placed into proximal jejunum just past gastrojejunal anastomosis. 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    Findings:   There diffuse edema to mucosa of jejunum and ileum with numerous flat and slightly raised red areas as well as few small telangiectasia. Small amount of fresh blood noted at 19 minutes and 15 seconds. Mucosal edema and red spots also noted to mucosa of cecum/ascending colon.   First Gastric image:  Given capsule placed endoscopically into jejunum First Duodenal image: Not applicable First Ileo-Cecal Valve image: 5 hours 46 minutes and 26 seconds First Cecal image: 5 hours 46 minutes and 37 seconds Gastric Passage time: Not applicable Small Bowel Passage time:  5 hours and 46 minutes  Summary & Recommendations:  Severe portal hypertensive enteropathy with evidence of stigmata of bleeding. Patient unfortunately is not a candidate for Eliquis since risk of rebleed is very high.

## 2015-07-31 ENCOUNTER — Encounter (HOSPITAL_COMMUNITY): Payer: Self-pay | Admitting: Internal Medicine

## 2015-07-31 ENCOUNTER — Inpatient Hospital Stay (HOSPITAL_COMMUNITY): Payer: Medicare Other

## 2015-07-31 DIAGNOSIS — R188 Other ascites: Secondary | ICD-10-CM

## 2015-07-31 DIAGNOSIS — Z515 Encounter for palliative care: Secondary | ICD-10-CM

## 2015-07-31 DIAGNOSIS — Z8619 Personal history of other infectious and parasitic diseases: Secondary | ICD-10-CM

## 2015-07-31 DIAGNOSIS — R1011 Right upper quadrant pain: Secondary | ICD-10-CM

## 2015-07-31 DIAGNOSIS — K639 Disease of intestine, unspecified: Secondary | ICD-10-CM

## 2015-07-31 DIAGNOSIS — D500 Iron deficiency anemia secondary to blood loss (chronic): Secondary | ICD-10-CM | POA: Insufficient documentation

## 2015-07-31 DIAGNOSIS — Z7189 Other specified counseling: Secondary | ICD-10-CM

## 2015-07-31 HISTORY — DX: Disease of intestine, unspecified: K63.9

## 2015-07-31 LAB — BASIC METABOLIC PANEL
Anion gap: 7 (ref 5–15)
BUN: 13 mg/dL (ref 6–20)
CHLORIDE: 110 mmol/L (ref 101–111)
CO2: 17 mmol/L — ABNORMAL LOW (ref 22–32)
CREATININE: 0.85 mg/dL (ref 0.44–1.00)
Calcium: 7.3 mg/dL — ABNORMAL LOW (ref 8.9–10.3)
GFR calc Af Amer: >60 mL/min (ref >60)
GFR calc non Af Amer: >60 mL/min (ref >60)
GLUCOSE: 95 mg/dL (ref 65–99)
POTASSIUM: 3.5 mmol/L (ref 3.5–5.1)
SODIUM: 134 mmol/L — AB (ref 135–145)

## 2015-07-31 LAB — CBC
HCT: 28.5 % — ABNORMAL LOW (ref 36.0–46.0)
HEMOGLOBIN: 9.1 g/dL — AB (ref 12.0–15.0)
MCH: 25.1 pg — AB (ref 26.0–34.0)
MCHC: 31.9 g/dL (ref 30.0–36.0)
MCV: 78.7 fL (ref 78.0–100.0)
Platelets: 53 10*3/uL — ABNORMAL LOW (ref 150–400)
RBC: 3.62 MIL/uL — ABNORMAL LOW (ref 3.87–5.11)
RDW: 17.8 % — ABNORMAL HIGH (ref 11.5–15.5)
WBC: 4.3 10*3/uL (ref 4.0–10.5)

## 2015-07-31 LAB — GLUCOSE, CAPILLARY
GLUCOSE-CAPILLARY: 98 mg/dL (ref 65–99)
GLUCOSE-CAPILLARY: 251 mg/dL — AB (ref 65–99)
Glucose-Capillary: 119 mg/dL — ABNORMAL HIGH (ref 65–99)
Glucose-Capillary: 141 mg/dL — ABNORMAL HIGH (ref 65–99)

## 2015-07-31 MED ORDER — MIDODRINE HCL 5 MG PO TABS
5.0000 mg | ORAL_TABLET | Freq: Three times a day (TID) | ORAL | Status: DC
  Administered 2015-07-31 (×3): 5 mg via ORAL
  Filled 2015-07-31 (×3): qty 1

## 2015-07-31 MED ORDER — MIDODRINE HCL 5 MG PO TABS: 5.0000 mg | ORAL_TABLET | Freq: Two times a day (BID) | ORAL | Status: DC

## 2015-07-31 MED ORDER — LACTULOSE 10 GM/15ML PO SOLN: 15.0000 g | Freq: Three times a day (TID) | ORAL | Status: DC

## 2015-07-31 MED ORDER — MIDODRINE HCL 5 MG PO TABS: ORAL_TABLET | ORAL | Status: DC

## 2015-07-31 MED ORDER — FUROSEMIDE 40 MG PO TABS: 40.0000 mg | ORAL_TABLET | Freq: Every day | ORAL | Status: DC

## 2015-07-31 MED ORDER — SPIRONOLACTONE 100 MG PO TABS: 100.0000 mg | ORAL_TABLET | Freq: Every day | ORAL | Status: DC

## 2015-07-31 MED ORDER — SODIUM CHLORIDE 0.45 % IV SOLN
INTRAVENOUS | Status: DC
  Administered 2015-07-31: 09:00:00 via INTRAVENOUS
  Filled 2015-07-31 (×6): qty 1000

## 2015-07-31 NOTE — Care Management Important Message (Deleted)
Important Message  Patient Details  Name: [name] MRN: [id number] Date of Birth: 04-10-1960   Medicare Important Message Given:  Yes    Sherald Barge, RN 07/31/2015, 12:51 PM

## 2015-07-31 NOTE — Care Management Note (Signed)
Case Management Note  Patient Details  Name: [name] MRN: [id number] Date of Birth: 1961/01/19  Subjective/Objective:                  Pt is from home, lives with family and is ind with ADL's. Pt has been receiving HH PT through [facility]. Pt states she needs cane for mobility, order placed and Romualdo Bolk, of [facility], per pt's choice, made aware of referral and will obtain pt info from chart and deliver cane to pt's room prior to DC. Pt interested in Hospice services and has asked to speak to the palliative care NP again. CM discussed [place] vs Hospice and how she can not continue [facility] PT and recieve hospice services. Pt states she would like to finish her PT through [facility] and then transition to hospice at home. Pt has chosen [employer], [place], of [facility] faxed pt info and will make contact with pt in 1-2 weeks. Romualdo Bolk, of [facility], made aware of resumption of services and pending DC tomorrow. Pt is aware HH has 48 hours to resume services at DC.  Action/Plan: Will DC home with [facility] PT and plans for hospice services in the near future.   Expected Discharge Date:     07/31/2015             Expected Discharge Plan:  [place]  In-House Referral:  Hospice / Palliative Care  Discharge planning Services  CM Consult  Post Acute Care Choice:  Hospice, Home Health Choice offered to:  Patient  DME Arranged:    DME Agency:  [place] Arranged:  PT [facility] Agency:     Status of Service:  In process, will continue to follow  If discussed at Long Length of Stay Meetings, dates discussed:    Additional Comments:  Sherald Barge, RN 07/31/2015, 2:34 PM

## 2015-07-31 NOTE — Progress Notes (Signed)
Discharge instruction reviewed with patient. No distress noted. Taken to lobby via wheelchair.

## 2015-07-31 NOTE — Progress Notes (Signed)
Daily Progress Note   Patient Name: [name]       Date: 07/31/2015 DOB: [birth year]  Age: 55 y.o. MRN#: [id number] Attending Physician: Rexene Alberts, MD Primary Care Physician: Glo Herring., MD Admit Date: 07/28/2015  Reason for Consultation/Follow-up: Disposition, Establishing goals of care, Hospice Evaluation and Psychosocial/spiritual support  Subjective: [name] resting quietly in bed, she greets me, making and keeping eye contact.  Persons [name] and a female friend are and bedside.  Her friend leaves as we start our discussion. [name] shares her recent results from pill cam, she states I had lots of red spots inside and frowns. She tells me she went for a tap of the fluid in her abdomen, but they were unable to find a pocket. She tells me that radiology shares she may be ready for tablet by Monday.   [name] tells me that she is very tired, she tells me that she has no energy left, and his strange just walking from the car to the home. She tells me that she is ready for services of hospice in her home, but continues to state that she would never go to hospice home. We talk about her choices, and she chooses [name].  She shares that she would like to continue physical therapy, and she has needs for equipment. [name] shares that she will no longer take the blood thinner, and understands that this will remove her from the transplant list.  Length of Stay: 2  Current Medications: Scheduled Meds:  . citalopram  20 mg Oral Daily  . insulin aspart  0-9 Units Subcutaneous TID WC  . levofloxacin  500 mg Oral Daily  . midodrine  5 mg Oral TID WC  . pantoprazole  40 mg Oral Daily  . rifaximin  550 mg Oral BID  . sodium chloride flush  3 mL Intravenous Q12H  . traZODone   50 mg Oral QHS    Continuous Infusions: . sodium chloride 0.45 % 1,000 mL with potassium chloride 20 mEq, sodium bicarbonate 50 mEq infusion 70 mL/hr at 07/31/15 0902    PRN Meds: albuterol, baclofen  Physical Exam  Constitutional: She is oriented to person, place, and time. No distress.  Makes and keeps eye contact, appropriate  HENT:  Head: Normocephalic and atraumatic.  Cardiovascular: Normal  rate and regular rhythm.   Pulmonary/Chest: Effort normal. No respiratory distress.  Abdominal: Soft. She exhibits distension. There is no guarding.  Incredibly distended  Neurological: She is alert and oriented to person, place, and time.  Skin: Skin is warm and dry.  Nursing note and vitals reviewed.           Vital Signs: BP 106/48 mmHg  Pulse 84  Temp(Src) 97.3 F (36.3 C) (Oral)  Resp 16  Ht 5\' 6"  (1.676 m)  Wt 71.5 kg (157 lb 10.1 oz)  BMI 25.45 kg/m2  SpO2 100% SpO2: SpO2: 100 % O2 Device: O2 Device: Not Delivered O2 Flow Rate:    Intake/output summary:  Intake/Output Summary (Last 24 hours) at 07/31/15 1434 Last data filed at 07/31/15 1300  Gross per 24 hour  Intake   2500 ml  Output      0 ml  Net   2500 ml   LBM: Last BM Date: 07/30/15 Baseline Weight: Weight: 64.411 kg (142 lb) Most recent weight: Weight: 71.5 kg (157 lb 10.1 oz)       Palliative Assessment/Data:      Patient Active Problem List   Diagnosis Date Noted  . Enteropathy 07/31/2015  . Depression with anxiety 07/30/2015  . Esophageal varices in cirrhosis ([place]) 07/30/2015  . Gastritis determined by endoscopy 07/30/2015  . Hypotension 07/30/2015  . Hyponatremia 07/29/2015  . Hypokalemia 07/29/2015  . GI bleeding 07/29/2015  . Gastric ulcer 07/29/2015  . Leukopenia 07/29/2015  . AKI (acute kidney injury) ([place]) 07/29/2015  . Diabetes mellitus type 2, controlled, without complications ([place]) [id number]  . GI bleed 07/17/2015  . Palliative care encounter   . Goals of care,  counseling/discussion   . DNR (do not resuscitate) discussion   . 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 Melena 07/14/2015  . Hepatic cirrhosis due to chronic hepatitis C infection ([place]) 07/14/2015  . History of hepatitis C - successfuly treated medically 07/14/2015  . Portal vein thrombosis 05/13/2014  . Other malaise and fatigue 08/05/2013  . History of liver transplant ([place]) 08/05/2013  . Hypoxemia 08/05/2013  . Abdominal pain, right upper quadrant 05/29/2013  . SBP (spontaneous bacterial peritonitis) ([place]) 05/28/2013  . Distal radius fracture 10/25/2012  . Metatarsal bone fracture 10/25/2012  . Insomnia 09/25/2012  . Encephalopathy, hepatic ([place]) 06/07/2012  . Hyperglycemia 06/06/2012  . LUQ abdominal pain 06/05/2012  . Ascites 05/14/2012  . Gram-negative bacteremia ([place]) 12/08/2011  . Pyelonephritis 12/07/2011  . Hydronephrosis of right kidney 12/07/2011  . Right ureteral stone 12/07/2011  . Thrombocytopenia ([place]) 12/07/2011  . Cirrhosis of liver ([place]) 05/02/2011  . Acute blood loss anemia 05/02/2011    Palliative Care Assessment & Plan   Patient Profile: 56 y.o. female with past medical history of Type II diabetes, hepatitis C, and hepatic cirrhosis admitted on 07/14/2015 with upper G.I. Bleed. She had been on the liver transplant list, at the top. She has been struggling with liver disease for 14 years now.   She returns again with a G.I. bleed.  Assessment: As above   Recommendations/Plan:  [name] states that she will not continue to take blood thinners.   She tells me that she would like to continue with some physical therapy if she is able, but is ready for hospice in her home.   Goals of Care and Additional Recommendations:  Limitations on Scope of Treatment: Continue to treat the treatable, including paracentesis as needed, but no extraordinary measures such as chest compressions or  intubation.  Code Status:    Code Status Orders          Start     Ordered   07/29/15 0144  Full code   Continuous     07/29/15 0143    Code Status History    Date Active Date Inactive Code Status Order ID Comments User Context   07/14/2015  8:13 PM 07/17/2015  7:53 PM DNR [id number]  Roney Jaffe, MD Inpatient   10/11/2013 11:08 PM 10/17/2013  9:31 PM Full Code [id number]  Orvan Falconer, MD Inpatient   05/28/2013  3:12 PM 06/03/2013  6:54 PM Full Code [id number]  Orson Eva, MD Inpatient   06/05/2012  9:23 PM 06/08/2012  2:50 PM Full Code [id number]  Bonnielee Haff, MD Inpatient   12/07/2011  1:53 AM 12/10/2011  6:15 PM Full Code [id number]  Elesa Massed, RN ED       Prognosis:   < 3 months, likely based on chronic disease burden, repeated hospitalizations, and declining functional status.  Discharge Planning:  Home with Hospice, [facility], physical therapy possibly at first.    Care plan was discussed with Nursing staff, case manager, and Dr. Caryn Section on next rounds.  Thank you for allowing the Palliative Medicine Team to assist in the care of this patient.   Time In: 1410 Time Out: 1445 Total Time 35 minutes Prolonged Time Billed  no       Greater than 50%  of this time was spent counseling and coordinating care related to the above assessment and plan.  Dove,Tasha A, NP  Please contact Palliative Medicine Team phone at [phone] for questions and concerns.

## 2015-07-31 NOTE — Care Management (Signed)
Patient Information    Patient Name Sex DOB   [name], [name] ([id number]) Female 1960-09-15     Room Bed   IC07 IC07-01    Patient Demographics    Address Phone E-mail Address   [name] [place] [address] [phone] (Home) [phone] (Mobile) [email or web address]    Patient Ethnicity & Race    Ethnic Group Patient Race   Not Hispanic or Latino White or Caucasian    Emergency Contact(s)    Name Relation Home Work Mobile   [place] Son [phone]  [phone]    Documents on File      Status Date Received Description   Documents for the Patient   EMR Patient Summary Not Received     Driver's License Received 0000000 RCGD   Release of Information Not Received     Release of Information Not Received     [place] Received 04/26/10    [place] E-Signature HIPAA Notice of Privacy Received 09/21/10    [place] E-Signature HIPAA Notice of Privacy Spanish Not Received     Advance Directives/Living Will/HCPOA/POA Not Received     Historic Radiology Documentation Not Received     Historic Radiology Documentation Not Received     Historic Radiology Documentation Not Received     Insurance Card Received 02/17/14 RCGD-UHC Referral # DOS:02/14/14 to 08/15/14 or 6 visits   Historic Radiology Documentation Not Received     Financial Application Not Received     Insurance Card Received 05/02/11 RCGD - OLD   HIM ROI Authorization Not Received  RCGD--05/02/11 note to PCP   HIM ROI Authorization Not Received  RCGD--05/02/11 note to Dr Toney Rakes, surgical consult   HIM ROI Authorization Not Received  RCGD--06/07/11 labs to Dr Catheryn Bacon   HIM ROI Authorization Not Received  RCGD--10/21/11 TCS op note & pathology result letter to PCP   HIM ROI Authorization Not Received  RCGD--10/21/11 TCS op note & pathology report to Dr Durward Parcel, transplant coordinator @ [facility]   HIM ROI Authorization Not Received  RCGD--12/06/11  [place] report to [employer], Liver Transplant Coordinator, [facility]   HIM ROI Authorization Not Received  RCGD--12/02/11 BMET report to [employer], Liver Transplant Coordinator, [facility]   HIM ROI Authorization Not Received  RCGD--12/06/11 [place] report to PCP   AMB Correspondence Not Received  referral Rehman MD, N   HIM ROI Authorization Not Received     Release of Information Not Received     Release of Information Not Received     HIM ROI Authorization Not Received  Dis charge summary labs and tests for cont of care   Release of Information Not Received     AMB Correspondence Not Received  11/13 orders Petri MD,W   HIM ROI Authorization Not Received     Release of Information Not Received     AMB Correspondence Not Received  04/13 Referral Rehman MD, N   HIM ROI Authorization Not Received  RCGD--05/14/12 prog note, labs [place] report to [employer], Liver Transplant Coordinator, [facility]   HIM ROI Authorization Not Received  RCGD--05/14/12 prog note to PCP   Insurance Card Received 06/26/12 RCGD - OLD   Insurance Card Received 08/05/13 BCBS/DJ/GNA   Insurance Card Received 10/20/12 old card   HIM ROI Authorization Not Received  RCGD--06/26/12 prog note to PCP   HIM ROI Authorization Not Received  RCGD--06/29/12 CT report to PCP   HIM ROI Authorization Not Received     Release of  Information Not Received     HIM ROI Authorization Not Received  RCGD--10/2010 to present prog notes, op note, pathology report, xrays, consult notes, H&P to DDS   HIM ROI Authorization Not Received     HIM ROI Authorization Not Received  RCGD--09/25/12 prog note to PCP   HIM ROI Authorization Not Received  RCGD--06/2012 to 09/2012 prog notes, labs & xray reports to [place] Not Received     HIM ROI Authorization Not Received  Please fax mammogram repor from June 2014 ASAP.     Release of Information Not Received     HIM ROI Authorization Not Received  RCGD--10/20/12 labs to [facility], Liver Transplant  Coordinator, [place] E-Signature HIPAA Notice of Privacy Received 10/25/12 ROSM   Insurance Card Received 10/25/12 UHC   AMB Correspondence Not Received  09/14 surg clearance Glennon Mac P   Release of Information Not Received     HIM ROI Authorization Not Received     HIM ROI Authorization Not Received     Release of Information Not Received     Release of Information Not Received     HIM ROI Authorization Not Received  RCGD--12/24/12 prog note to PCP ([place])   Advanced Beneficiary Notice (ABN) Not Received     Insurance Card Received 04/01/13 BCBS/FT2015 - OLD   HIM ROI Authorization  04/29/13 RCGD--04/17/13 prog note to PCP ([place])   HIM ROI Authorization  05/31/13 RCGD--05/31/13 labs to Dr Monica Martinez   HIM ROI Authorization  06/05/13    HIM ROI Authorization  06/06/13    [facility] Record  05/28/13 04/15 PROGRESS NOTES [facility] LIVER [facility]   Release of Information  06/06/13    HIM ROI Authorization  06/06/13    Release of Information  06/07/13    Release of Information  06/11/13    HIM ROI Authorization  06/28/13 RCGD--06/24/13 prog note to PCP ([place])   HIM ROI Authorization  09/10/13 RCGD--09/06/13 [place] report to Dr Monica Martinez   HIM ROI Authorization  09/14/13    HIM ROI Authorization  09/20/13    HIM ROI Authorization  10/31/13    AMB Correspondence  08/05/13 [facility] /[facility]   HIM ROI Authorization  11/05/13    Release of Information  11/11/13    HIM ROI Authorization  11/13/13 Urgent. STAT. Need admission, clinic notes, Sept. 2015 & April 2015 for pancreatitis.   HIM ROI Authorization  11/21/13 RCGD--05/2012, 05/2013 & 10/2013 prog notes and consult notes to [facility]   Release of Information  11/25/13    HIM ROI Authorization  12/23/13 BCBS of [place]-EMSI DOS 02/07/2013-present   HIM ROI Authorization  12/26/13 RCGD--12/10/13 prog note to PCP ([place])   HIM ROI Authorization  01/17/14 EMSI/BCBS of [place] Medicare Risk  Adjustment Review   HIM ROI Authorization  01/20/14    HIM ROI Authorization  01/27/14 RCGD--12/25/13 (01/23/14) labs to Nicanor Bake, [facility] Liver Transplant Coordinator   Insurance Card Received 03/11/14 UHC/RCGD/at   HIM ROI Authorization  02/21/14 RCGD--02/19/14 EGD op note to PCP ([place])   HIM ROI Authorization  02/21/14 RCGD--02/19/14 EGD op note to Dr Monica Martinez   HIM ROI Authorization  03/12/14 RCGD--03/11/14 prog note to PCP ([place])   Other Photo ID Not Received     Insurance Card Received 05/13/14 [facility] MCR/RCGD/at   HIM ROI Authorization  05/16/14 RCGD--05/13/14 prog note to PCP ([place])   HIM ROI Authorization  09/02/14 RCGD--09/01/14 prog note to PCP ([place])  [facility] Record  07/14/14 PROGRESS NOTES [place] Card Received 09/23/14 [facility]   HIM ROI Authorization  11/19/14    HIM ROI Authorization  12/30/14 RCGD--12/29/14 prog note to PCP ([place])   HIM ROI Authorization  04/01/15 Authorization for batch [employer] [place] Review    ltd    04/01/2015.[facility] Record  03/05/15 PROGRESS NOTE [facility]   HIM ROI Authorization  04/21/15 CIOX/[employer] 2017 Affordable Care Act Review Dr. Najeeb Chelyan Card Received 07/10/15 BCBS/RCGD/HHS   Advance Directives/Living Will/HCPOA/POA  07/20/15    Documents for the Encounter   AOB (Assignment of Insurance Benefits) Not Received     E-signature AOB Signed 07/28/15    MEDICARE RIGHTS Not Received     E-signature Medicare Rights Signed 07/28/15    Cardiac Monitoring Strip  07/29/15    Ultrasound  07/31/15     Admission Information    Attending Provider Admitting Provider Admission Type Admission Date/Time   Rexene Alberts, MD Jani Gravel, MD Emergency 07/28/15 2213   Discharge Date Hospital Service Auth/Cert Status Service Area    Internal Medicine Incomplete [place]   Unit Room/Bed Admission Status    AP-ICCUP NURSING IC07/IC07-01 Admission (Confirmed)         Admission    Complaint   rectal bleeding, GI bleed and anemia.    Hospital Account    Name Acct ID Class Status Primary Coverage   Khylah, [place] [id number] Inpatient Open BLUE CROSS BLUE SHIELD MEDICARE - BCBS MEDICARE        Guarantor Account (for Hospital Account [id number])    Name Relation to Pt Service Area Active? Acct Type   Ashley Royalty Self [facility]SA Yes Personal/Family   Address Phone       [address] [place], [place] [address] [phone])          Coverage Information (for Hospital Account [id number])    F/O Payor/Plan Precert #   [facility] SHIELD MEDICARE/BCBS MEDICARE [id number]   Subscriber Subscriber #   Keneisha, Milosevic [id number]   Address Phone   [address] [place], [place] [address] [phone]

## 2015-07-31 NOTE — Progress Notes (Signed)
  Subjective:  Patient feels weak today. She is on multiple watery stools but no melena or rectal bleeding. She has noted abdominal distention since admission. She went down to radiology for ultrasound but ascites felt not large enough to be tapped. Patient is asking about hospice. She also has decided not to go back on anticoagulants.   Objective: Blood pressure 104/54, pulse 60, temperature 97.3 F (36.3 C), temperature source Oral, resp. rate 18, height 5\' 6"  (1.676 m), weight 157 lb 10.1 oz (71.5 kg), SpO2 99 %. Patient is alert and does not have asterixis. Abdomen is more distended today than 2 days ago but it is still not tight. Splenomegaly. No extremity edema.  Labs/studies Results:   Recent Labs  07/29/15 1325 07/30/15 0413 07/31/15 0417  WBC 3.5* 2.5* 4.3  HGB 8.9* 8.6* 9.1*  HCT 27.9* 26.0* 28.5*  PLT 58* 51* 53*    BMET   Recent Labs  07/29/15 0417 07/30/15 0413 07/31/15 0417  NA 134* 134* 134*  K 3.7 3.8 3.5  CL 101 107 110  CO2 27 24 17*  GLUCOSE 123* 91 95  BUN 17 15 13   CREATININE 1.16* 1.19* 0.85  CALCIUM 7.9* 7.5* 7.3*    LFT   Recent Labs  07/28/15 2230 07/29/15 0417  PROT 6.4* 5.3*  ALBUMIN 3.2* 2.6*  AST 26 22  ALT 31 26  ALKPHOS 65 52  BILITOT 0.9 1.0    PT/INR   Recent Labs  07/28/15 2230  LABPROT 19.2*  INR 1.62*     Assessment:  #1. GI bleed secondary to pronounced portal hypertensive enteropathy. Images from small bowel given capsule study reviewed with the patient and her family members. #2. Acute on chronic anemia. She has received one unit of PRBCs. H&H is coming up. #3. Diarrhea secondary to lactulose but enteropathy may also be contrary bleeding. Will hold lactulose today and resume at lower dose tomorrow. #4. Decompensated cirrhosis. 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 Since SMV thrombosis has not resolved with anticoagulant she has been taken  off list for liver transplant. Goal at this point is to prevent and treat complications of liver disease.  Recommendations:  Hold lactulose today and resume and half the usual dose starting tomorrow morning. Plan to see her in the office in 3 weeks.  I will be out of town. Please contact Dr. Oneida Alar for GI issues.

## 2015-07-31 NOTE — Discharge Summary (Addendum)
Physician Discharge Summary  [name] [id number] DOB: Jul 23, 1960 DOA: 07/28/2015  PCP: Glo Herring., MD  Admit date: 07/28/2015 Discharge date: 07/31/2015  Time spent: Greater than 30 minutes  Recommendations for Outpatient Follow-up:  1. Recommend follow-up of the patient's blood pressure on Midodrine and and reassessment of her volume status on lower doses of diuretics.  2. Recommend follow-up CBC in 1-2 weeks. 3. Home health physical therapy and other services reordered at the time of discharge. 4. Patient is interested in hospice. She tentatively plans to request hospice for home in the near future.   Discharge Diagnoses:  1. Recurrent GI bleeding in the setting of anticoagulation, secondary to pronounced portal hypertensive enteropathy. 2. Acute on chronic anemia secondary to acute blood loss. Status post 1 unit packed red blood cell transfusion. 3. 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 4. SMV thrombosis. All anticoagulation has now been discontinued. 5. Persistent hypotension, likely from hypovolemia. 6. Type 2 diabetes mellitus. 7. Acute kidney injury secondary to prerenal azotemia. 8. Hyponatremia secondary to hypovolemia. None. Depression with anxiety.   Discharge Condition: Improved.  Diet recommendation: Regular.  Filed Weights   07/29/15 0300 07/30/15 0401 07/31/15 0500  Weight: 64.9 kg (143 lb 1.3 oz) 66.5 kg (146 lb 9.7 oz) 71.5 kg (157 lb 10.1 oz)    History of present illness:  Patient is a 55 year old woman with a history of upper GI bleed on Eliquis on 07/14/15, blood loss anemia, hepatitis C cirrhosis, SMV thrombosis, esophageal varices, status post gastrojejunostomy, and DM 2, who was admitted for recurrent black tarry stools and bright red blood per rectum. She had restarted Eliquis 4 days prior to admission. In the ED, she was afebrile and hemodynamically stable.  Her lab data were significant for a serum sodium of 132, creatinine 1.27, hemoglobin of 9.3, platelet count of 60, INR 1.62, and glucose of 191. Stool Hemoccult was positive. She was admitted for management of recurrent GI bleeding. Of note, patient had a colonoscopy 02/2015 at [place] which revealed nonthrombosed external hemorrhoids, colon polyps, and small diverticula.  Hospital Course:  1. Recurrent GI bleeding in a patient with history of peptic ulcer disease and esophageal varices. (Of note, patient had a colonoscopy 02/2015 at [place] which revealed nonthrombosed external hemorrhoids, colon polyps, and small diverticula.) Eliquis is being withheld. Patient was started on Protonix drip and IV fluids for hydration. She was transfused 1 unit of packed red blood cells on 07/29/15. Gastroenterologist, Dr. Laural Golden was consulted. He performed an EGD. The EGD revealed esophageal varices, esophageal scarring, patent jejunostomy, healed ulcer, and diffuse edema and erythema of the jejunal mucosa. No active bleeding noted. Her diet was advanced which she tolerated well. -Capsule study ordered per GI and revealed pronounced portal hypertensive enteropathy. Dr. Laural Golden stated that this was the source of her bleeding. He believes that she is no longer a candidate for further anticoagulation secondary to multiple bleeding episodes. Acute blood loss anemia. Patient's hemoglobin was 9.3 on admission. It fell to 8.0. In the setting of low blood pressures and GI bleeding, she was transfused 1 unit of packed rib blood cells on 07/29/15. Her hemoglobin improved appropriately. It remained stable and her hemoglobin was 9.1 at the time of discharge. Leukopenia and thrombocytopenia. Both were  likely secondary to cirrhosis and are chronic. Her TSH was within normal limits. her platelet count remained stable ranging in the low to mid 50s and her white blood cell count  was within normal limits at 4.3. Hepatitis C  cirrhosis with resultant coagulopathy, esophageal varices, ascites etc. Patient had been listed for liver transplant, but was taken off due to persistent SMV thrombosis. She is treated chronically with lactulose, rifaximin, spironolactone, Levaquin, and Lasix. She reported eradication of hepatitis C. Her chronic medications were continued with the exception of spironolactone. Due to persistently low blood pressures, Lasix was eventually withheld for several days. -At the time of discharge, she was instructed to decrease Lasix to 40 mg daily and to decrease spironolactone to 100 mg daily. -Lactulose was changed when necessary for home (she had been taking it as needed at home). -Ultrasound was ordered to assess for ascites. There was a small amount of ascites that did not require paracentesis. SMV thrombosis. The patient had been treated with Coumadin in the past, but anticoagulation was changed to Eliquis. Eliquis was stopped temporarily early June 2017 due to GI bleeding. She had restarted it before this hospitalization and recurrent bleeding. It was discontinued again. Due to severe portal hypertensive enteropathy and recurrent bleeding, Dr. Laural Golden felt that she was no longer a candidate for anticoagulation, therefore all anticoagulation was discontinued. This was discussed with the patient and her family who were in agreement. Hypotension.  Patient's blood pressure became persistently low, ranging in the mid to upper 80s. She was given boluses and started on vigorous IV fluids. Lasix and spironolactone were held. Eventually, midodrine was ordered. Her blood pressure improved. She was discharged on twice a day dosing of midodrine for 1 week and then daily thereafter. She was instructed to resume Lasix and spironolactone at a lower dose as prescribed. Type II insulin requiring diabetes. Patient is treated with Lantus chronically which was held. Sliding scale NovoLog was  started. Her CBGs were  controlled. Lantus was restarted at discharge. Acute kidney injury. Patient's creatinine was 1.27 on admission. It was within normal limits 3 weeks ago. She was started on IV fluids. Her creatinine has improved to 0.85. Hyponatremia. Patient's serum sodium was 132 on admission. She was started on normal saline IV fluids. Her serum sodium has improved.  Palliative care consult. Palliative care was consulted as the patient had discussed establishing goals of care and hospice evaluation during the previous hospitalization. NP, Ms. Dove provided the consultation and support. The patient is actually considering hospice in the near future, pending completion of home health services.   Procedures: -Capsule study 6/06-25-2022/17 revealed pronounced portal hypertensive enteropathy -EGD 08/02/15 per Dr. Laural Golden : EGD revealed 3 columns of grade 1 esophageal varices, esophageal scarring from previous banding, small gastric pouch with patent gastrojejunostomy but healed ulcer. Diffuse edema to jejunal mucosa. -Transfusion of 1 unit of packed blood cells on 08-02-15.  Consultations:  Gastroenterology, Dr. Laural Golden  Palliative care  Discharge Exam: Filed Vitals:   07/31/15 1500 07/31/15 1600  BP: 104/54 110/59  Pulse: 60 71  Temp:    Resp: 18 15  Temperature 97.3. Respiratory rate 15. Oxygen saturation 99% on room air.  General: Pleasant 55 year old woman in no acute distress. Cardiovascular: S1, S2, no murmurs rubs or gallops. Respiratory: Clear to auscultation bilaterally. Abdomen: Mild ascites, positive bowel sounds, minimal hypogastric tenderness; no distention or rigidity.  Discharge Instructions   Discharge Instructions    Diet - low sodium heart healthy    Complete by:  As directed      Discharge instructions    Complete by:  As directed   Restart on Sunday 08/02/15- Lasix and spironolactone at lower doses as  indicated on your med list.     Increase activity slowly    Complete by:  As  directed           Current Discharge Medication List    START taking these medications   Details  midodrine (PROAMATINE) 5 MG tablet Medication to help with low blood pressure. Take 1 tablet 2 times daily for 7 days and then 1 tablet 1 time daily thereafter. Qty: 45 tablet, Refills: 2      CONTINUE these medications which have CHANGED   Details  furosemide (LASIX) 40 MG tablet Take 1 tablet (40 mg total) by mouth daily.    spironolactone (ALDACTONE) 100 MG tablet Take 1 tablet (100 mg total) by mouth daily.      CONTINUE these medications which have NOT CHANGED   Details  b complex vitamins tablet Take 1 tablet by mouth daily.    baclofen (LIORESAL) 10 MG tablet Take 1 tablet (10 mg total) by mouth 3 (three) times daily as needed for muscle spasms (alternates with baclofen). Qty: 60 each, Refills: 1   Associated Diagnoses: Muscle spasms of neck    cyclobenzaprine (FLEXERIL) 10 MG tablet Take 1 tablet (10 mg total) by mouth 3 (three) times daily as needed for muscle spasms. As needed Qty: 30 tablet, Refills: 0    escitalopram (LEXAPRO) 10 MG tablet Take 10 mg by mouth daily. Refills: 10    ferrous sulfate 325 (65 FE) MG tablet Take 325 mg by mouth 2 (two) times daily with a meal.    LANTUS SOLOSTAR 100 UNIT/ML Solostar Pen Inject 20 Units into the skin every evening.  Refills: 0    levofloxacin (LEVAQUIN) 500 MG tablet TAKE (1) TABLET BY MOUTH ONCE DAILY. Qty: 30 tablet, Refills: 0    Multiple Vitamin (MULTIVITAMIN WITH MINERALS) TABS tablet Take 1 tablet by mouth daily.    pantoprazole (PROTONIX) 40 MG tablet Take 1 tablet (40 mg total) by mouth 2 (two) times daily before a meal. Qty: 60 tablet, Refills: 0    rifaximin (XIFAXAN) 550 MG TABS tablet Take 1 tablet (550 mg total) by mouth 2 (two) times daily. Qty: 60 tablet, Refills: 5   Associated Diagnoses: Hepatic encephalopathy (HCC)    traZODone (DESYREL) 50 MG tablet Take 1 tablet (50 mg total) by mouth at  bedtime. Qty: 30 tablet, Refills: 0   Associated Diagnoses: Insomnia    VENTOLIN HFA 108 (90 Base) MCG/ACT inhaler Inhale 1-2 puffs into the lungs every 6 (six) hours as needed for wheezing.  Refills: 1    lactulose (CHRONULAC) 10 GM/15ML solution Take 45 mLs (30 g total) by mouth 3 (three) times daily. Qty: 240 mL, Refills: 3      STOP taking these medications     ELIQUIS 2.5 MG TABS tablet      sucralfate (CARAFATE) 1 g tablet      sucralfate (CARAFATE) 1 GM/10ML suspension        Allergies  Allergen Reactions  . Ancef [Cefazolin Sodium] Other (See Comments)    "Blisters in mouth and vagina" Tolerates Penicillins  . Gadobenate Itching    Nausea and vomiting reported by patient post injection of multihance.  Pt states itching and throat redness after receiving MRI contrast.  . Iodinated Diagnostic Agents Rash    Patient reports itching, nausea and vomiting.  . Tape Rash    Blisters skin.   Follow-up Information    Schedule an appointment as soon as possible for a visit with Hildred Laser,  MD.   Specialty:  Gastroenterology   Why:  For hospital follow-up and recheck of your blood pressure and blood count.   Contact information:   [address], [address] [place] [address] [phone]       Follow up with Glo Herring., MD In 1 week.   Specialty:  Internal Medicine   Why:  For hospital follow-up of your blood pressure.   Contact information:   [address] [place] [id number] [phone]        The results of significant diagnostics from this hospitalization (including imaging, microbiology, ancillary and laboratory) are listed below for reference.    Significant Diagnostic Studies: US Abdomen Limited  07/31/2015  CLINICAL DATA:  Ascites. EXAM: LIMITED ABDOMEN ULTRASOUND FOR ASCITES TECHNIQUE: Limited ultrasound survey for ascites was performed in all four abdominal quadrants. COMPARISON:  Ultrasound 10/31/2014. FINDINGS: Mild ascites noted.  No large pocket noted for safe paracentesis identified. Follow-up exam can be obtained as needed. IMPRESSION: Mild ascites. No prominent pocket noted for safe paracentesis identified. Electronically Signed   By: [name]  Register   On: 07/31/2015 11:04   Dg Abd Acute W/chest  07/14/2015  CLINICAL DATA:  Abdominal pain with diarrhea. EXAM: DG ABDOMEN ACUTE W/ 1V CHEST COMPARISON:  CT abdomen and pelvis July 10, 2014 FINDINGS: PA chest: No edema or consolidation. Heart size and pulmonary vascularity are normal. No adenopathy. Supine and upright abdomen: There is moderate stool throughout the colon diffusely. There is no bowel dilatation or air-fluid level suggesting obstruction. No free air. There are multiple surgical clips in the abdomen and pelvis. There are phleboliths the pelvis. Spleen appears enlarged. IMPRESSION: Enlarged spleen. Moderate diffuse stool throughout the colon. No bowel obstruction or free air. No parenchymal lung edema or consolidation. Electronically Signed   By: Lowella Grip III M.D.   On: 07/14/2015 16:30    Microbiology: Recent Results (from the past 240 hour(s))  MRSA PCR Screening     Status: None   Collection Time: 07/29/15  1:46 AM  Result Value Ref Range Status   MRSA by PCR NEGATIVE NEGATIVE Final    Comment:        The GeneXpert MRSA Assay (FDA approved for NASAL specimens only), is one component of a comprehensive MRSA colonization surveillance program. It is not intended to diagnose MRSA infection nor to guide or monitor treatment for MRSA infections.      Labs: Basic Metabolic Panel:  Recent Labs Lab 07/28/15 2230 07/29/15 0417 07/30/15 0413 07/31/15 0417  NA 132* 134* 134* 134*  K 3.5 3.7 3.8 3.5  CL 99* 101 107 110  CO2 26 27 24  17*  GLUCOSE 191* 123* 91 95  BUN 18 17 15 13   CREATININE 1.27* 1.16* 1.19* 0.85  CALCIUM 8.3* 7.9* 7.5* 7.3*   Liver Function Tests:  Recent Labs Lab 07/28/15 2230 07/29/15 0417  AST 26 22  ALT 31 26   ALKPHOS 65 52  BILITOT 0.9 1.0  PROT 6.4* 5.3*  ALBUMIN 3.2* 2.6*   No results for input(s): LIPASE, AMYLASE in the last 168 hours. No results for input(s): AMMONIA in the last 168 hours. CBC:  Recent Labs Lab 07/28/15 2230 07/29/15 0417 07/29/15 1325 07/30/15 0413 07/31/15 0417  WBC 4.2 2.8* 3.5* 2.5* 4.3  HGB 9.3* 8.0* 8.9* 8.6* 9.1*  HCT 28.7* 25.2* 27.9* 26.0* 28.5*  MCV 76.3* 76.6* 78.6 78.8 78.7  PLT 60* 58* 58* 51* 53*   Cardiac Enzymes: No results for input(s): CKTOTAL, CKMB,  CKMBINDEX, TROPONINI in the last 168 hours. BNP: BNP (last 3 results) No results for input(s): BNP in the last 8760 hours.  ProBNP (last 3 results) No results for input(s): PROBNP in the last 8760 hours.  CBG:  Recent Labs Lab 07/30/15 2333 07/31/15 0422 07/31/15 0758 07/31/15 1131 07/31/15 1627  GLUCAP 224* 98 119* 141* 251*       Signed:  Rupert Azzara MD.  [facility] 07/31/2015, 4:52 PM

## 2015-07-31 NOTE — Progress Notes (Signed)
Patient ambulated around nursing unit with family tolerated well.

## 2015-07-31 NOTE — Care Management Important Message (Signed)
Important Message  Patient Details  Name: [name] MRN: [id number] Date of Birth: July 25, 1960   Medicare Important Message Given:  Yes    Sherald Barge, RN 07/31/2015, 12:02 PM

## 2015-08-01 LAB — TYPE AND SCREEN
ABO/RH(D): B POS
ANTIBODY SCREEN: NEGATIVE
UNIT DIVISION: 0
Unit division: 0

## 2015-08-03 ENCOUNTER — Encounter (HOSPITAL_COMMUNITY): Payer: Self-pay | Admitting: Internal Medicine

## 2015-08-06 DIAGNOSIS — Z1389 Encounter for screening for other disorder: Secondary | ICD-10-CM | POA: Diagnosis not present

## 2015-08-06 DIAGNOSIS — Z6824 Body mass index (BMI) 24.0-24.9, adult: Secondary | ICD-10-CM | POA: Diagnosis not present

## 2015-08-06 DIAGNOSIS — D649 Anemia, unspecified: Secondary | ICD-10-CM | POA: Diagnosis not present

## 2015-08-06 DIAGNOSIS — R188 Other ascites: Secondary | ICD-10-CM | POA: Diagnosis not present

## 2015-08-06 DIAGNOSIS — F329 Major depressive disorder, single episode, unspecified: Secondary | ICD-10-CM | POA: Diagnosis not present

## 2015-08-18 ENCOUNTER — Encounter (INDEPENDENT_AMBULATORY_CARE_PROVIDER_SITE_OTHER): Payer: Self-pay | Admitting: Internal Medicine

## 2015-08-18 ENCOUNTER — Ambulatory Visit (INDEPENDENT_AMBULATORY_CARE_PROVIDER_SITE_OTHER): Payer: Medicare Other | Admitting: Internal Medicine

## 2015-08-18 ENCOUNTER — Encounter (INDEPENDENT_AMBULATORY_CARE_PROVIDER_SITE_OTHER): Payer: Self-pay | Admitting: *Deleted

## 2015-08-18 VITALS — BP 110/74 | HR 69 | Temp 97.7°F | Resp 16 | Ht 66.0 in | Wt 144.6 lb

## 2015-08-18 DIAGNOSIS — K7290 Hepatic failure, unspecified without coma: Secondary | ICD-10-CM | POA: Diagnosis not present

## 2015-08-18 DIAGNOSIS — D649 Anemia, unspecified: Secondary | ICD-10-CM

## 2015-08-18 DIAGNOSIS — R1031 Right lower quadrant pain: Secondary | ICD-10-CM | POA: Diagnosis not present

## 2015-08-18 DIAGNOSIS — K7682 Hepatic encephalopathy: Secondary | ICD-10-CM

## 2015-08-18 DIAGNOSIS — K7460 Unspecified cirrhosis of liver: Secondary | ICD-10-CM

## 2015-08-18 DIAGNOSIS — R188 Other ascites: Secondary | ICD-10-CM

## 2015-08-18 MED ORDER — DIPHENHYDRAMINE HCL 50 MG PO TABS: 50.0000 mg | ORAL_TABLET | ORAL | Status: DC

## 2015-08-18 MED ORDER — PREDNISONE 50 MG PO TABS: ORAL_TABLET | ORAL | Status: DC

## 2015-08-18 NOTE — Progress Notes (Signed)
Presenting complaint;  Right low quadrant abdominal pain of 3 days duration. Patient is here for follow-up decompensated cirrhosis.  Database and Subjective:  Patient is 55 year old Caucasian female who is here for scheduled visit. She was hospitalized twice last month for GI bleeding. On first EGD she was found to have small anastomotic ulcer. Second EGD revealed healed ulcer. Small bowel given capsule study revealed severe portal hypertensive enteropathy with evidence of bleeding. She was therefore discharged without anticoagulant. Now she presents with three-day history of right low quadrant abdominal pain which is worse after meals. However she woke up this morning around 2 AM with pain. She took 1 pain pill and it helped but not meliorate the pain completely. She is not having diarrhea melena or rectal bleeding hematuria or dysuria. She has stones in the right kidney but she states this pain is completely different. She also denies fever but she feels cold all the time. She continues to feel extremely weak and unable to do much at home. Her son was accompanying her today does cooking and all the household work.   Current Medications: Outpatient Encounter Prescriptions as of 08/18/2015  Medication Sig  . b complex vitamins tablet Take 1 tablet by mouth daily. Reported on 08/18/2015  . baclofen (LIORESAL) 10 MG tablet Take 1 tablet (10 mg total) by mouth 3 (three) times daily as needed for muscle spasms (alternates with baclofen).  . cyclobenzaprine (FLEXERIL) 10 MG tablet Take 1 tablet (10 mg total) by mouth 3 (three) times daily as needed for muscle spasms. As needed  . escitalopram (LEXAPRO) 10 MG tablet Take 20 mg by mouth daily. Reported on 08/18/2015  . ferrous sulfate 325 (65 FE) MG tablet Take 325 mg by mouth 2 (two) times daily with a meal. Reported on 08/18/2015  . furosemide (LASIX) 40 MG tablet Take 1 tablet (40 mg total) by mouth daily.  Marland Kitchen lactulose (CHRONULAC) 10 GM/15ML solution  Take 45 mLs (30 g total) by mouth 3 (three) times daily.  Marland Kitchen LANTUS SOLOSTAR 100 UNIT/ML Solostar Pen Inject 20 Units into the skin every evening. Reported on 08/18/2015  . levofloxacin (LEVAQUIN) 500 MG tablet TAKE (1) TABLET BY MOUTH ONCE DAILY.  . midodrine (PROAMATINE) 5 MG tablet Medication to help with low blood pressure. Take 1 tablet 2 times daily for 7 days and then 1 tablet 1 time daily thereafter.  . Multiple Vitamin (MULTIVITAMIN WITH MINERALS) TABS tablet Take 1 tablet by mouth daily. Reported on 08/18/2015  . oxyCODONE (OXY IR/ROXICODONE) 5 MG immediate release tablet Take 5 mg by mouth every 6 (six) hours as needed for severe pain.  . pantoprazole (PROTONIX) 40 MG tablet Take 1 tablet (40 mg total) by mouth 2 (two) times daily before a meal.  . rifaximin (XIFAXAN) 550 MG TABS tablet Take 1 tablet (550 mg total) by mouth 2 (two) times daily.  Marland Kitchen spironolactone (ALDACTONE) 100 MG tablet Take 1 tablet (100 mg total) by mouth daily.  . traZODone (DESYREL) 50 MG tablet Take 1 tablet (50 mg total) by mouth at bedtime.  . [DISCONTINUED] VENTOLIN HFA 108 (90 Base) MCG/ACT inhaler Inhale 1-2 puffs into the lungs every 6 (six) hours as needed for wheezing. Reported on 08/18/2015   No facility-administered encounter medications on file as of 08/18/2015.     Objective: Blood pressure 110/74, pulse 69, temperature 97.7 F (36.5 C), resp. rate 16, height 5\' 6"  (1.676 m), weight 144 lb 9 oz (65.573 kg), SpO2 98 %. Patient is alert and appears  to be chronically ill. She does not have asterixis. Conjunctiva is pink. Sclera is nonicteric Oropharyngeal mucosa is normal. No neck masses or thyromegaly noted. Cardiac exam with regular rhythm normal S1 and S2. No murmur or gallop noted. Lungs are clear to auscultation. Abdomen is full with small umbilicus/perineum likely hernia. Enlarged spleen which is mildly tender. Mild to moderate tenderness noted in right low quadrant without guarding. Liver edge is  indistinct. No inguinal herniae noted. No LE edema or clubbing noted.  Labs/studies Results: Lab data from 07/30/2015 H&H 9.1 and 28.5 and platelet count 53K.   sodium 134, potassium 3.8, chloride 107, CO2 24, BUN 15 and creatinine 1.19  Assessment:  #1.Acute onset of right low quadrant abdominal pain. Abdominal exam is negative for mass in this region but she has mild to moderate tenderness. She could have mesenteric adenopathy. Doubt that this pain is due to urolithiasis which she has history of. Also doubt SBP given localized tenderness. #2. Anemia. She has chronic anemia complicated by recent bleed from small bowel enteropathy well documented on small bowel given capsule study during recent hospitalization. She is now off all anticoagulants and has not bled anymore. #3. Decompensated cirrhosis complicated by hepatic encephalopathy IT is an thrombocytopenia.. Ascites appears to be stable or may have decreased since last evaluation. Patient unfortunately has been the listed for liver transplant because of persistent SMV thrombosis.   Plan:  Hold furosemide for two days and that after 40 mg Monday Wednesday and Friday. Patient will go the lab for CBC and metabolic 7. Abdominopelvic CT with contrast ASAP. Office visit in one month.

## 2015-08-18 NOTE — Patient Instructions (Signed)
If pain is not relieved with pain medication body of fever please go to emergency room. Physician will call with results of CT and blood work when completed.

## 2015-08-19 ENCOUNTER — Ambulatory Visit (HOSPITAL_COMMUNITY)
Admission: RE | Admit: 2015-08-19 | Discharge: 2015-08-19 | Disposition: A | Discharge status: Home / Self Care | Payer: Medicare Other | Source: Ambulatory Visit | Attending: Internal Medicine | Admitting: Internal Medicine

## 2015-08-19 ENCOUNTER — Other Ambulatory Visit (INDEPENDENT_AMBULATORY_CARE_PROVIDER_SITE_OTHER): Payer: Self-pay | Admitting: Internal Medicine

## 2015-08-19 DIAGNOSIS — N200 Calculus of kidney: Secondary | ICD-10-CM | POA: Diagnosis not present

## 2015-08-19 DIAGNOSIS — R188 Other ascites: Secondary | ICD-10-CM | POA: Insufficient documentation

## 2015-08-19 DIAGNOSIS — K7460 Unspecified cirrhosis of liver: Secondary | ICD-10-CM | POA: Insufficient documentation

## 2015-08-19 DIAGNOSIS — R1031 Right lower quadrant pain: Secondary | ICD-10-CM | POA: Diagnosis not present

## 2015-08-19 DIAGNOSIS — J90 Pleural effusion, not elsewhere classified: Secondary | ICD-10-CM | POA: Insufficient documentation

## 2015-08-19 DIAGNOSIS — R161 Splenomegaly, not elsewhere classified: Secondary | ICD-10-CM | POA: Diagnosis not present

## 2015-08-19 LAB — CBC
HCT: 31 % — ABNORMAL LOW (ref 35.0–45.0)
HEMOGLOBIN: 9.9 g/dL — AB (ref 11.7–15.5)
MCH: 24.6 pg — ABNORMAL LOW (ref 27.0–33.0)
MCHC: 31.9 g/dL — AB (ref 32.0–36.0)
MCV: 76.9 fL — ABNORMAL LOW (ref 80.0–100.0)
Platelets: 73 10*3/uL — ABNORMAL LOW (ref 140–400)
RBC: 4.03 MIL/uL (ref 3.80–5.10)
RDW: 19.4 % — ABNORMAL HIGH (ref 11.0–15.0)
WBC: 5 10*3/uL (ref 3.8–10.8)

## 2015-08-19 MED ORDER — IOPAMIDOL (ISOVUE-300) INJECTION 61%
100.0000 mL | Freq: Once | INTRAVENOUS | Status: AC | PRN
  Administered 2015-08-19: 100 mL via INTRAVENOUS

## 2015-08-19 MED ORDER — CIPROFLOXACIN HCL 500 MG PO TABS: 500.0000 mg | ORAL_TABLET | Freq: Two times a day (BID) | ORAL | Status: DC

## 2015-08-19 MED ORDER — METRONIDAZOLE 250 MG PO TABS: 250.0000 mg | ORAL_TABLET | Freq: Three times a day (TID) | ORAL | Status: DC

## 2015-08-19 MED ORDER — FUROSEMIDE 40 MG PO TABS: 40.0000 mg | ORAL_TABLET | ORAL | Status: DC

## 2015-08-20 ENCOUNTER — Ambulatory Visit (INDEPENDENT_AMBULATORY_CARE_PROVIDER_SITE_OTHER): Payer: Medicare Other | Admitting: Internal Medicine

## 2015-08-21 ENCOUNTER — Telehealth (INDEPENDENT_AMBULATORY_CARE_PROVIDER_SITE_OTHER): Payer: Self-pay | Admitting: Internal Medicine

## 2015-08-21 DIAGNOSIS — K745 Biliary cirrhosis, unspecified: Secondary | ICD-10-CM | POA: Diagnosis not present

## 2015-08-21 LAB — COMPREHENSIVE METABOLIC PANEL
ALT: 38 U/L — ABNORMAL HIGH (ref 6–29)
AST: 45 U/L — ABNORMAL HIGH (ref 10–35)
Albumin: 3 g/dL — ABNORMAL LOW (ref 3.6–5.1)
Alkaline Phosphatase: 108 U/L (ref 33–130)
BUN: 11 mg/dL (ref 7–25)
CHLORIDE: 96 mmol/L — AB (ref 98–110)
CO2: 24 mmol/L (ref 20–31)
Calcium: 8.1 mg/dL — ABNORMAL LOW (ref 8.6–10.4)
Creat: 1.06 mg/dL — ABNORMAL HIGH (ref 0.50–1.05)
GLUCOSE: 265 mg/dL — AB (ref 65–99)
POTASSIUM: 3.6 mmol/L (ref 3.5–5.3)
Sodium: 132 mmol/L — ABNORMAL LOW (ref 135–146)
Total Bilirubin: 1.1 mg/dL (ref 0.2–1.2)
Total Protein: 6.2 g/dL (ref 6.1–8.1)

## 2015-08-21 NOTE — Telephone Encounter (Signed)
rx cmet

## 2015-08-24 ENCOUNTER — Encounter (HOSPITAL_COMMUNITY)
Admission: RE | Admit: 2015-08-24 | Discharge: 2015-08-24 | Disposition: A | Discharge status: Home / Self Care | Payer: Medicare Other | Source: Ambulatory Visit | Attending: Internal Medicine | Admitting: Internal Medicine

## 2015-08-24 DIAGNOSIS — K7460 Unspecified cirrhosis of liver: Secondary | ICD-10-CM | POA: Diagnosis not present

## 2015-08-24 MED ORDER — SODIUM CHLORIDE 0.9 % IV SOLN
INTRAVENOUS | Status: DC
  Administered 2015-08-24: 14:00:00 via INTRAVENOUS

## 2015-08-24 MED ORDER — ALBUMIN HUMAN 25 % IV SOLN
50.0000 g | Freq: Once | INTRAVENOUS | Status: AC
  Administered 2015-08-24: 50 g via INTRAVENOUS
  Filled 2015-08-24: qty 200

## 2015-08-25 ENCOUNTER — Encounter (HOSPITAL_COMMUNITY)
Admission: RE | Admit: 2015-08-25 | Discharge: 2015-08-25 | Disposition: A | Discharge status: Home / Self Care | Payer: Medicare Other | Source: Ambulatory Visit | Attending: Internal Medicine | Admitting: Internal Medicine

## 2015-08-25 DIAGNOSIS — K7460 Unspecified cirrhosis of liver: Secondary | ICD-10-CM | POA: Diagnosis not present

## 2015-08-25 MED ORDER — ALBUMIN HUMAN 25 % IV SOLN
50.0000 g | Freq: Once | INTRAVENOUS | Status: AC
  Administered 2015-08-25: 50 g via INTRAVENOUS
  Filled 2015-08-25: qty 200

## 2015-08-25 MED ORDER — SODIUM CHLORIDE 0.9 % IV SOLN
INTRAVENOUS | Status: DC
  Administered 2015-08-25: 12:00:00 via INTRAVENOUS

## 2015-08-26 ENCOUNTER — Encounter (HOSPITAL_COMMUNITY)
Admission: RE | Admit: 2015-08-26 | Discharge: 2015-08-26 | Disposition: A | Discharge status: Home / Self Care | Payer: Medicare Other | Source: Ambulatory Visit | Attending: Internal Medicine | Admitting: Internal Medicine

## 2015-08-26 ENCOUNTER — Encounter (HOSPITAL_COMMUNITY): Payer: Self-pay

## 2015-08-26 DIAGNOSIS — K7460 Unspecified cirrhosis of liver: Secondary | ICD-10-CM | POA: Diagnosis not present

## 2015-08-26 MED ORDER — ALBUMIN HUMAN 25 % IV SOLN
50.0000 g | Freq: Once | INTRAVENOUS | Status: AC
  Administered 2015-08-26: 50 g via INTRAVENOUS
  Filled 2015-08-26: qty 200

## 2015-08-26 MED ORDER — SODIUM CHLORIDE 0.9 % IV SOLN
INTRAVENOUS | Status: DC
  Administered 2015-08-26: 14:00:00 via INTRAVENOUS

## 2015-09-08 ENCOUNTER — Ambulatory Visit (INDEPENDENT_AMBULATORY_CARE_PROVIDER_SITE_OTHER): Payer: Medicare Other | Admitting: Neurology

## 2015-09-08 ENCOUNTER — Encounter: Payer: Self-pay | Admitting: Neurology

## 2015-09-08 VITALS — BP 110/60 | HR 60 | Ht 66.0 in | Wt 148.0 lb

## 2015-09-08 DIAGNOSIS — G4701 Insomnia due to medical condition: Secondary | ICD-10-CM | POA: Diagnosis not present

## 2015-09-08 DIAGNOSIS — K7460 Unspecified cirrhosis of liver: Secondary | ICD-10-CM

## 2015-09-08 DIAGNOSIS — G4737 Central sleep apnea in conditions classified elsewhere: Secondary | ICD-10-CM | POA: Diagnosis not present

## 2015-09-08 DIAGNOSIS — K7200 Acute and subacute hepatic failure without coma: Secondary | ICD-10-CM | POA: Diagnosis not present

## 2015-09-08 MED ORDER — ZOLPIDEM TARTRATE 10 MG PO TABS
10.0000 mg | ORAL_TABLET | Freq: Every evening | ORAL | 0 refills | Status: DC | PRN
Start: 2015-09-08 — End: 2015-09-24

## 2015-09-08 NOTE — Patient Instructions (Signed)
Fall Prevention in the Home   Falls can cause injuries. They can happen to people of all ages. There are many things you can do to make your home safe and to help prevent falls.   WHAT CAN I DO ON THE OUTSIDE OF MY HOME?  · Regularly fix the edges of walkways and driveways and fix any cracks.  · Remove anything that might make you trip as you walk through a door, such as a raised step or threshold.  · Trim any bushes or trees on the path to your home.  · Use bright outdoor lighting.  · Clear any walking paths of anything that might make someone trip, such as rocks or tools.  · Regularly check to see if handrails are loose or broken. Make sure that both sides of any steps have handrails.  · Any raised decks and porches should have guardrails on the edges.  · Have any leaves, snow, or ice cleared regularly.  · Use sand or salt on walking paths during winter.  · Clean up any spills in your garage right away. This includes oil or grease spills.  WHAT CAN I DO IN THE BATHROOM?   · Use night lights.  · Install grab bars by the toilet and in the tub and shower. Do not use towel bars as grab bars.  · Use non-skid mats or decals in the tub or shower.  · If you need to sit down in the shower, use a plastic, non-slip stool.  · Keep the floor dry. Clean up any water that spills on the floor as soon as it happens.  · Remove soap buildup in the tub or shower regularly.  · Attach bath mats securely with double-sided non-slip rug tape.  · Do not have throw rugs and other things on the floor that can make you trip.  WHAT CAN I DO IN THE BEDROOM?  · Use night lights.  · Make sure that you have a light by your bed that is easy to reach.  · Do not use any sheets or blankets that are too big for your bed. They should not hang down onto the floor.  · Have a firm chair that has side arms. You can use this for support while you get dressed.  · Do not have throw rugs and other things on the floor that can make you trip.  WHAT CAN I DO IN  THE KITCHEN?  · Clean up any spills right away.  · Avoid walking on wet floors.  · Keep items that you use a lot in easy-to-reach places.  · If you need to reach something above you, use a strong step stool that has a grab bar.  · Keep electrical cords out of the way.  · Do not use floor polish or wax that makes floors slippery. If you must use wax, use non-skid floor wax.  · Do not have throw rugs and other things on the floor that can make you trip.  WHAT CAN I DO WITH MY STAIRS?  · Do not leave any items on the stairs.  · Make sure that there are handrails on both sides of the stairs and use them. Fix handrails that are broken or loose. Make sure that handrails are as long as the stairways.  · Check any carpeting to make sure that it is firmly attached to the stairs. Fix any carpet that is loose or worn.  · Avoid having throw rugs at the top   or bottom of the stairs. If you do have throw rugs, attach them to the floor with carpet tape.  · Make sure that you have a light switch at the top of the stairs and the bottom of the stairs. If you do not have them, ask someone to add them for you.  WHAT ELSE CAN I DO TO HELP PREVENT FALLS?  · Wear shoes that:    Do not have high heels.    Have rubber bottoms.    Are comfortable and fit you well.    Are closed at the toe. Do not wear sandals.  · If you use a stepladder:    Make sure that it is fully opened. Do not climb a closed stepladder.    Make sure that both sides of the stepladder are locked into place.    Ask someone to hold it for you, if possible.  · Clearly mark and make sure that you can see:    Any grab bars or handrails.    First and last steps.    Where the edge of each step is.  · Use tools that help you move around (mobility aids) if they are needed. These include:    Canes.    Walkers.    Scooters.    Crutches.  · Turn on the lights when you go into a dark area. Replace any light bulbs as soon as they burn out.  · Set up your furniture so you have a clear  path. Avoid moving your furniture around.  · If any of your floors are uneven, fix them.  · If there are any pets around you, be aware of where they are.  · Review your medicines with your doctor. Some medicines can make you feel dizzy. This can increase your chance of falling.  Ask your doctor what other things that you can do to help prevent falls.     This information is not intended to replace advice given to you by your health care provider. Make sure you discuss any questions you have with your health care provider.     Document Released: 11/20/2008 Document Revised: 06/10/2014 Document Reviewed: 02/28/2014  Elsevier Interactive Patient Education ©2016 [employer]

## 2015-09-08 NOTE — Progress Notes (Signed)
[facility] [facility]   Provider:  Larey Seat, M D  Referring Provider: Redmond School, MD Primary Care Physician:  Glo Herring., MD  Chief Complaint  Patient presents with  . New Patient (Initial Visit)    gait changes    HPI:  [name] is a 55 y.o. caucasian, right handed  female, who was seen here as a referral  from Dr. Gerarda Fraction for a sleep evaluation  In 2015 is now re-referred for gait problems. .  Here is my last note : 76 Dear Dr. Gerarda Fraction ,  Thank you for allowing me to see your patient [name] today for a sleep medicine evaluation.  As you know she is on a waiting list for a hepatic transplant and suffers from increasing insomnia and fatigue. She suffered from bacterial peritonitis just this April, a life threatening condition. She acknowledged that she is at this time sleeping poorly because of stress and worries  related to medical costs, doctor's appointments and her medical problems in general.  On 07-29-13, your office arranged for an overnight pulse oximetry on room air.  This returned with only 2 hours and 13 minutes off reading time and documented 12.3 minutes of  desaturation oxygen levels at night- with the lowest oxygen saturation at 84%. The patient advised me,  that she lost her finger clip at around 2:30 in the morning , so the study is very short and may not be valid to qualify for  oxygen use overnight- based on this test - period. What is of  interest is that the patient did have a complaint not as much off sleepiness, but of excessive daytime fatigue, feeling off not having any energy. The patient goes to bed 20 minutes after taking her night time medication, and will sleep at about 11.20 PM. She will sleep through the night and wake up at 6 AM , which used to be her time to rise and go to work. She now is considered disabled (since March 2015) and could sleep in, she will remain in bed and sleep another 4-5 hours - and will  fall asleep again around 3 PM. This nap will add another hour of sleep time.  Total nocturnal sleep time is 10-12 hours now! Some time she goes to sleep in front of her TV around 9 PM already again.  She has no history of shift work.  The endorsed the Epworth  score at 7 points , the fatigue severity score at 55 points. GDS at 10 points   Interval history on 09/08/2015, Today's visit is dedicated to a gait evaluation. [name] is an insulin-dependent diabetic, who also takes baclofen, cyclobenzaprine, Celexa, Ventolin inhaler, trazodone at night, Protonix, oxycodone, ferrous sulfate, warfarin, Diflucan, Lasix her last visit note from 08/06/2015 with her primary care physician indicated that he evaluated her for depression with a screening test and review with the results of the visit. He felt that she was positively depressed. He followed up on a hospital discharge earlier the same week to any [facility]. The hospitalization became necessary because of October acute gastrointestinal bleeding, portal hypertension, and anemia. 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 She assured me that her hepatic condition was not alcoholic in origin.     Review of Systems: Out of a complete 14 system review, the patient complains of only the following symptoms, and all other  reviewed systems are negative.She has no history of shift work.  The endorsed the Epworth  score at 7 points , the fatigue severity score at 55 points. GDS at 10 points. The patient endorsed anemia, very easy bruising, the feeling of being cold, heart murmur, itching, weakness, depression, the feeling of not getting enough sleep and not having enough energy, racing thoughts, insomnia and memory loss, confusion with brain organic ammonium damage. .     Social History   Social History  . Marital status: Divorced    Spouse name: N/A  . Number of children: 1   . Years of education: 48   Occupational History  . Not on file.   Social History Main Topics  . Smoking status: Former Smoker    Packs/day: 0.50    Years: 5.00    Types: Cigarettes    Quit date: 05/02/1991  . Smokeless tobacco: Never Used  . Alcohol use No  . Drug use: No  . Sexual activity: Not Currently   Other Topics Concern  . Not on file   Social History Narrative   Patient is single and lives at home and her son lives with her.   Patient is disabled.   Patient has a high school education.   Patient is left-handed.   Patient drinks 24 oz of tea/soda daily.    Family History  Problem Relation Age of Onset  . Dementia Mother   . Heart disease Father   . Liver disease Father   . Hypertension Father   . Diabetes Father   . Dementia Father   . Hypertension Brother   . Other Son     Cervical dystonia    Past Medical History:  Diagnosis Date  . Anemia   . Anxiety   . Ascites   . 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 Complication of anesthesia   . Depression   . Diabetes mellitus type 2, controlled, without complications ([place])   . Enteropathy 07/31/2015   Portal hypertensive enteropathy per capsule study, with stigmata of bleeding.  . Esophageal varices ([place])   . Gastric ulcer 07/15/2015   per EGD; no stigmata of bleeding  . GI bleed 07/17/2015  . Gram-negative bacteremia ([place]) 12/08/2011  . [id number])    "monthly" (11/07/2012)  . Heart murmur   . Hepatitis C    Hx: of Hep "C" it was eradicated  . History of hepatitis C - successfuly treated medically 07/14/2015  . Kidney stones   . Liver failure ([place])    "I'm on liver transplant list @ [facility]" (11/07/2012)  . PONV (postoperative nausea and vomiting)   . Portal vein thrombosis   . Renal insufficiency   . Right ureteral stone 12/07/2011  . 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 Type II diabetes mellitus ([place])    "not since gastric bypass" (11/07/2012)    Past Surgical History:  Procedure  Laterality Date  . ABDOMINAL HYSTERECTOMY  2003  . CESAREAN SECTION  1995  . CHOLECYSTECTOMY  1987  . COLONOSCOPY    . COLONOSCOPY  10/21/2011   Procedure: COLONOSCOPY;  Surgeon: Rogene Houston, MD;  [facility];  Service: Endoscopy;  Laterality: N/A;  245   . CYSTOSCOPY W/ RETROGRADES  12/08/2011   Procedure: CYSTOSCOPY WITH RETROGRADE PYELOGRAM;  Surgeon: Marissa Nestle, MD;  Location: [facility];  Service: Urology;  Laterality: Right;  . CYSTOSCOPY WITH STENT PLACEMENT  12/08/2011   Procedure: CYSTOSCOPY WITH STENT PLACEMENT;  Surgeon:  Marissa Nestle, MD;  Location: [facility];  Service: Urology;  Laterality: Right;  . DILATION AND CURETTAGE OF UTERUS    . ESOPHAGOGASTRODUODENOSCOPY N/A 02/19/2014   Procedure: ESOPHAGOGASTRODUODENOSCOPY (EGD);  Surgeon: Rogene Houston, MD;  [facility];  Service: Endoscopy;  Laterality: N/A;  100  . ESOPHAGOGASTRODUODENOSCOPY N/A 07/15/2015   Procedure: ESOPHAGOGASTRODUODENOSCOPY (EGD);  Surgeon: Rogene Houston, MD;  [facility];  Service: Endoscopy;  Laterality: N/A;  . ESOPHAGOGASTRODUODENOSCOPY N/A 07/29/2015   Procedure: ESOPHAGOGASTRODUODENOSCOPY (EGD);  Surgeon: Rogene Houston, MD;  [facility];  Service: Endoscopy;  Laterality: N/A;  . GASTRIC BYPASS  ~ 1995  . HERNIA REPAIR  04/14/11   "one in my bellybutton" (11/07/2012)  . INGUINAL HERNIA REPAIR Right    "maybe 2" (11/07/2012)  . KNEE ARTHROSCOPY Right   . LITHOTRIPSY     "several times" (11/07/2012)  . OPEN REDUCTION INTERNAL FIXATION (ORIF) DISTAL RADIAL FRACTURE Right 11/07/2012   Procedure: OPEN REDUCTION INTERNAL FIXATION (ORIF) RIGHT DISTAL RADIAL FRACTURE;  Surgeon: [name], MD;  Location: [facility];  Service: Orthopedics;  Laterality: Right;  . ORIF DISTAL RADIUS FRACTURE Right 11/07/2012  . UPPER GASTROINTESTINAL ENDOSCOPY      Current Outpatient Prescriptions  Medication Sig Dispense Refill  . b complex vitamins tablet Take 1 tablet  by mouth daily. Reported on 08/18/2015    . baclofen (LIORESAL) 10 MG tablet Take 1 tablet (10 mg total) by mouth 3 (three) times daily as needed for muscle spasms (alternates with baclofen). 60 each 1  . ciprofloxacin (CIPRO) 500 MG tablet Take 1 tablet (500 mg total) by mouth 2 (two) times daily. 14 tablet 0  . cyclobenzaprine (FLEXERIL) 10 MG tablet Take 1 tablet (10 mg total) by mouth 3 (three) times daily as needed for muscle spasms. As needed 30 tablet 0  . diphenhydrAMINE (BENADRYL) 50 MG tablet Take 1 tablet (50 mg total) by mouth 1 day or 1 dose. 1 tablet 0  . escitalopram (LEXAPRO) 10 MG tablet Take 20 mg by mouth daily. Reported on 08/18/2015  10  . ferrous sulfate 325 (65 FE) MG tablet Take 325 mg by mouth 2 (two) times daily with a meal. Reported on 08/18/2015    . furosemide (LASIX) 40 MG tablet Take 1 tablet (40 mg total) by mouth 3 (three) times a week. 30 tablet   . lactulose (CHRONULAC) 10 GM/15ML solution Take 45 mLs (30 g total) by mouth 3 (three) times daily. 240 mL 3  . LANTUS SOLOSTAR 100 UNIT/ML Solostar Pen Inject 20 Units into the skin every evening. Reported on 08/18/2015  0  . levofloxacin (LEVAQUIN) 500 MG tablet TAKE (1) TABLET BY MOUTH ONCE DAILY. 30 tablet 0  . metroNIDAZOLE (FLAGYL) 250 MG tablet Take 1 tablet (250 mg total) by mouth 3 (three) times daily. 21 tablet 0  . midodrine (PROAMATINE) 5 MG tablet Medication to help with low blood pressure. Take 1 tablet 2 times daily for 7 days and then 1 tablet 1 time daily thereafter. 45 tablet 2  . Multiple Vitamin (MULTIVITAMIN WITH MINERALS) TABS tablet Take 1 tablet by mouth daily. Reported on 08/18/2015    . oxyCODONE (OXY IR/ROXICODONE) 5 MG immediate release tablet Take 5 mg by mouth every 6 (six) hours as needed for severe pain.    . pantoprazole (PROTONIX) 40 MG tablet Take 1 tablet (40 mg total) by mouth 2 (two) times daily before a meal. 60 tablet 0  . rifaximin (XIFAXAN)  550 MG TABS tablet Take 1 tablet (550 mg  total) by mouth 2 (two) times daily. 60 tablet 5  . spironolactone (ALDACTONE) 100 MG tablet TAKE 3 TABLETS DAILY. 90 tablet 0  . traZODone (DESYREL) 50 MG tablet Take 1 tablet (50 mg total) by mouth at bedtime. 30 tablet 0   No current facility-administered medications for this visit.     Allergies as of 09/08/2015 - Review Complete 09/08/2015  Allergen Reaction Noted  . Ancef [cefazolin sodium] Other (See Comments) 05/02/2011  . Gadobenate Itching 09/01/2014  . Iodinated diagnostic agents Rash 09/01/2014  . Tape Rash 09/01/2014    Vitals: BP 110/60   Pulse 60   Ht 5\' 6"  (1.676 m)   Wt 148 lb (67.1 kg)   BMI 23.89 kg/m  Last Weight:  Wt Readings from Last 1 Encounters:  09/08/15 148 lb (67.1 kg)   Last Height:   Ht Readings from Last 1 Encounters:  09/08/15 5\' 6"  (1.676 m)    Physical exam:  General: The patient is awake, alert and appears not in acute distress. The patient is well groomed. Head: Normocephalic, atraumatic. Neck is supple. Mallampati 2 , neck circumference: 13 inches  , no TMJ.  Cardiovascular:  Regular rate and rhythm , without  murmurs or carotid bruit, and without distended neck veins. Respiratory: Lungs are clear to auscultation. Skin:  Without evidence of edema, or rash Trunk:patient has normal posture.  Neurologic exam : The patient is awake and alert, oriented to place and time.  Memory subjective described as intact.  There is a normal attention span & concentration ability.  Speech is fluent without  dysarthria,  Low volume with dysphonia , not aphasia. Mood and affect are depressed, frustrated. .  Cranial nerves: Pupils are equal and briskly reactive to light. Funduscopic exam without  evidence of pallor or edema.  Extraocular movements  in vertical and horizontal planes intact and without nystagmus.  Visual fields by finger perimetry are intact. Hearing to finger rub intact.  Facial sensation intact to fine touch. Facial motor strength is  symmetric and tongue and uvula move midline.  Motor exam:   Normal tone , muscle bulk and symmetric strength in all extremities.  Sensory:  Fine touch, pinprick and vibration were tested in all extremities. Proprioception is normal.  Coordination: Rapid alternating movements in the fingers/hands was slightly delayed and slowed, but there was no myolclonus. Handwriting intact. Finger-to-nose maneuver tested and normal without evidence of ataxia, dysmetria or tremor. No asterixis.   Gait and station: Patient walks with assistive device ,cannot  climb up to the exam table. The patient is able to rise from a seated position without bracing herself, her arms crossed in front of her chest. Strength within normal limits. Stance is stable and normal.  Tandem gait is very fragmented. The patient displayed left arm tremor while walking, she turns with 6-7 tiny steps, she walks much more straight if she can lean on. Step width is much reduced in comparison to 2-1/2 years ago. There is no orthostasis, vertigo and no nystagmus noted. There is mild gait ataxia seen.  Romberg testing is intact .  Deep tendon reflexes: in the  upper and lower extremities are symmetric and intact. Babinski maneuver downgoing.   Assessment:  After physical and neurologic examination, review of laboratory studies, imaging, neurophysiology testing and pre-existing records, assessment is   1) Fatigue related to medical condition, hypersomnia with long sleep time. hypoxemia is not confirmed by the short ONO time.  2) gait disorder - falls  (nearly falls ) daily, she can trip over objects in her gait she doesn't lift her feet very high her gait is shuffling and of small width. This me be multifactorial in origin, she does not have back pain or orthopedic problems with her lower extremities or lower back. This could well be neurotoxicity related, she also noted that fatigue may play a role as her gait deteriorates as the day goes on. I  have no evidence of spinal stenosis, her reflex level is actually normal. I'm able to review her hospital records with the most recent laboratory testing.  I would like for the patient to be evaluated by neuro rehabilitation for gait, evaluation and treatment fall prevention classes. She had PT at home before which seems not to have been as effective.  Transportation to be arranged. Neuro rehab next door.  I don't think this is primarily a neurological gait disorder, but metabolically mediated.  I have recommended for the patient to use vitamin D but she has a history of kidney stones. She is taking a vitamin B complex B12, she's taking iron supplements.  Mckenzee Beem, MD   Dr Gerarda Fraction, Dr. Laural Golden

## 2015-09-24 ENCOUNTER — Ambulatory Visit (INDEPENDENT_AMBULATORY_CARE_PROVIDER_SITE_OTHER): Payer: Medicare Other | Admitting: Internal Medicine

## 2015-09-24 ENCOUNTER — Encounter (INDEPENDENT_AMBULATORY_CARE_PROVIDER_SITE_OTHER): Payer: Self-pay | Admitting: Internal Medicine

## 2015-09-24 VITALS — BP 102/60 | HR 66 | Temp 98.6°F | Resp 18 | Ht 66.0 in | Wt 140.8 lb

## 2015-09-24 DIAGNOSIS — K7460 Unspecified cirrhosis of liver: Secondary | ICD-10-CM | POA: Diagnosis not present

## 2015-09-24 DIAGNOSIS — K219 Gastro-esophageal reflux disease without esophagitis: Secondary | ICD-10-CM | POA: Diagnosis not present

## 2015-09-24 DIAGNOSIS — D649 Anemia, unspecified: Secondary | ICD-10-CM | POA: Diagnosis not present

## 2015-09-24 DIAGNOSIS — K7682 Hepatic encephalopathy: Secondary | ICD-10-CM

## 2015-09-24 DIAGNOSIS — K7290 Hepatic failure, unspecified without coma: Secondary | ICD-10-CM | POA: Diagnosis not present

## 2015-09-24 DIAGNOSIS — R188 Other ascites: Secondary | ICD-10-CM

## 2015-09-24 MED ORDER — SPIRONOLACTONE 100 MG PO TABS: 100.0000 mg | ORAL_TABLET | Freq: Every day | ORAL | 3 refills | Status: DC

## 2015-09-24 MED ORDER — LEVOFLOXACIN 500 MG PO TABS: ORAL_TABLET | ORAL | 3 refills | Status: DC

## 2015-09-24 MED ORDER — RIFAXIMIN 550 MG PO TABS: 550.0000 mg | ORAL_TABLET | Freq: Two times a day (BID) | ORAL | 3 refills | Status: DC

## 2015-09-24 MED ORDER — LEVOFLOXACIN 500 MG PO TABS: ORAL_TABLET | ORAL | 2 refills | Status: DC

## 2015-09-24 MED ORDER — PANTOPRAZOLE SODIUM 40 MG PO TBEC: 40.0000 mg | DELAYED_RELEASE_TABLET | Freq: Two times a day (BID) | ORAL | 3 refills | Status: DC

## 2015-09-24 MED ORDER — LACTULOSE 10 GM/15ML PO SOLN: 30.0000 g | Freq: Three times a day (TID) | ORAL | 5 refills | Status: DC

## 2015-09-24 NOTE — Patient Instructions (Addendum)
Physician will call with results of blood tests when completed. Goal for you to have at least 3-4 bowel movements per day. Can decrease furosemide dose to 20 mg every other day as discussed

## 2015-09-24 NOTE — Progress Notes (Signed)
Presenting complaint;  Follow-up for decompensated liver disease.  Subjective:  Patient is 55 year old Caucasian female who is here for scheduled visit. She was last seen on 08/18/2015. She has not experienced any more episodes of melena or rectal bleeding. Her appetite is not good. She has lost 4 pounds since her last visit. She continues to complain of extreme weakness and fatigue. She states simple tasks take her long time and she has to take breaks. She says she had 2 episodes where her family members could not wake her up in the morning. One of these episodes occurred while she was at the beach. She does not recall that she took baclofen Flexeril or oxycodone. She states she cannot come off trazodone as she is not able to sleep at night without it. She has not had confusion spells. She is having 2 bowel movements per day. She remains with intermittent pain in left upper quadrant of her abdomen. She feels ascites has not reaccumulated since her last visit. She needs multiple refills.   Current Medications: Outpatient Encounter Prescriptions as of 09/24/2015  Medication Sig  . b complex vitamins tablet Take 1 tablet by mouth daily. Reported on 08/18/2015  . baclofen (LIORESAL) 10 MG tablet Take 1 tablet (10 mg total) by mouth 3 (three) times daily as needed for muscle spasms (alternates with baclofen).  . cyclobenzaprine (FLEXERIL) 10 MG tablet Take 1 tablet (10 mg total) by mouth 3 (three) times daily as needed for muscle spasms. As needed  . escitalopram (LEXAPRO) 10 MG tablet Take 20 mg by mouth daily. Reported on 08/18/2015  . ferrous sulfate 325 (65 FE) MG tablet Take 325 mg by mouth 2 (two) times daily with a meal. Reported on 08/18/2015  . furosemide (LASIX) 40 MG tablet Take 1 tablet (40 mg total) by mouth 3 (three) times a week.  . lactulose (CHRONULAC) 10 GM/15ML solution Take 45 mLs (30 g total) by mouth 3 (three) times daily.  Marland Kitchen LANTUS SOLOSTAR 100 UNIT/ML Solostar Pen Inject 20 Units  into the skin every evening. Reported on 08/18/2015  . levofloxacin (LEVAQUIN) 500 MG tablet TAKE (1) TABLET BY MOUTH ONCE DAILY.  . midodrine (PROAMATINE) 5 MG tablet Medication to help with low blood pressure. Take 1 tablet 2 times daily for 7 days and then 1 tablet 1 time daily thereafter.  . Multiple Vitamin (MULTIVITAMIN WITH MINERALS) TABS tablet Take 1 tablet by mouth daily. Reported on 08/18/2015  . oxyCODONE (OXY IR/ROXICODONE) 5 MG immediate release tablet Take 5 mg by mouth every 6 (six) hours as needed for severe pain.  . pantoprazole (PROTONIX) 40 MG tablet Take 1 tablet (40 mg total) by mouth 2 (two) times daily before a meal.  . rifaximin (XIFAXAN) 550 MG TABS tablet Take 1 tablet (550 mg total) by mouth 2 (two) times daily.  Marland Kitchen spironolactone (ALDACTONE) 100 MG tablet TAKE 3 TABLETS DAILY.  . traZODone (DESYREL) 50 MG tablet Take 1 tablet (50 mg total) by mouth at bedtime.  . [DISCONTINUED] ciprofloxacin (CIPRO) 500 MG tablet Take 1 tablet (500 mg total) by mouth 2 (two) times daily. (Patient not taking: Reported on 09/24/2015)  . [DISCONTINUED] diphenhydrAMINE (BENADRYL) 50 MG tablet Take 1 tablet (50 mg total) by mouth 1 day or 1 dose. (Patient not taking: Reported on 09/24/2015)  . [DISCONTINUED] metroNIDAZOLE (FLAGYL) 250 MG tablet Take 1 tablet (250 mg total) by mouth 3 (three) times daily. (Patient not taking: Reported on 09/24/2015)  . [DISCONTINUED] zolpidem (AMBIEN) 10 MG tablet Take  1 tablet (10 mg total) by mouth at bedtime as needed for sleep. (Patient not taking: Reported on 09/24/2015)   No facility-administered encounter medications on file as of 09/24/2015.      Objective: Blood pressure 102/60, pulse 66, temperature 98.6 F (37 C), temperature source Oral, resp. rate 18, height 5\' 6"  (1.676 m), weight 140 lb 12.8 oz (63.9 kg). Patient is alert and does not have asterixis. Conjunctiva is pink. Sclera is nonicteric Oropharyngeal mucosa is normal. No neck masses or  thyromegaly noted. Cardiac exam with regular rhythm normal S1 and S2. No murmur or gallop noted. Lungs are clear to auscultation. Abdomen is full but not distended or tense. She has large easily palpable see pain without splenic wrap. Liver edge is also palpable below right costal margin in epigastric region. It is firm. Flanks are dull. No LE edema or clubbing noted.  Labs/studies Results: H&H was 9.1 and 31.01 08/18/2015    Assessment:  #1. Hepatic encephalopathy. Recent spells of prolonged somnolence could be related to underlying disease or one of her medications. Ideally she should not be taking trazodone oxycodone Flexeril and baclofen. She reassures me that she takes later 3 medications very occasionally. She needs to have at least 3-4 bowel movements per day. #2. Ascites. Ascites appears to be stable. Will drop diuretic dose. as even mild dehydration could result in hepatic encephalopathy. She has history of SBP and is on Levaquin for secondary prophylaxis. #3. Anemia. Acute on chronic anemia secondary to GI bleed from anastomotic ulcer(first time) and severe portal hypertensive enteropathy(second hospitalization). She has not bled anymore since anticoagulant was discontinued. #4. Incapacitating fatigue secondary to underlying liver disease. Anemia may be contribution to her fatigue. #5. History of SMV thrombosis. Thrombus did not resolve with 18 months of warfarin and she could not tolerate Eliquis. 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   Plan:  Patient advised not to take Flexeril baclofen and oxycodone if at all possible. If she has to take 1 she should not take the others at the same time. CBC and comprehensive chemistry panel. Patient can decrease furosemide dose to 20 mg 3 times a week if she loses another 3-4 pounds. Decrease spironolactone dose 200 mg by mouth daily. New prescription given. Patient needs to titrate lactulose in order to have at  least 3 bowel movements per day. New prescription given for lactulose, Levaquin, pantoprazole and Xifaxan. Office visit in 8 weeks.

## 2015-09-26 LAB — COMPREHENSIVE METABOLIC PANEL
ALK PHOS: 85 U/L (ref 33–130)
ALT: 26 U/L (ref 6–29)
AST: 28 U/L (ref 10–35)
Albumin: 3.4 g/dL — ABNORMAL LOW (ref 3.6–5.1)
BUN: 10 mg/dL (ref 7–25)
CALCIUM: 8.2 mg/dL — AB (ref 8.6–10.4)
CO2: 26 mmol/L (ref 20–31)
Chloride: 101 mmol/L (ref 98–110)
Creat: 1.05 mg/dL (ref 0.50–1.05)
Glucose, Bld: 239 mg/dL — ABNORMAL HIGH (ref 65–99)
POTASSIUM: 3.8 mmol/L (ref 3.5–5.3)
Sodium: 134 mmol/L — ABNORMAL LOW (ref 135–146)
TOTAL PROTEIN: 6.3 g/dL (ref 6.1–8.1)
Total Bilirubin: 1 mg/dL (ref 0.2–1.2)

## 2015-09-26 LAB — CBC
HEMATOCRIT: 32 % — AB (ref 35.0–45.0)
Hemoglobin: 10.2 g/dL — ABNORMAL LOW (ref 11.7–15.5)
MCH: 25.8 pg — ABNORMAL LOW (ref 27.0–33.0)
MCHC: 31.9 g/dL — ABNORMAL LOW (ref 32.0–36.0)
MCV: 81 fL (ref 80.0–100.0)
MPV: 9.7 fL (ref 7.5–12.5)
PLATELETS: 63 10*3/uL — AB (ref 140–400)
RBC: 3.95 MIL/uL (ref 3.80–5.10)
RDW: 16.8 % — ABNORMAL HIGH (ref 11.0–15.0)
WBC: 4.2 10*3/uL (ref 3.8–10.8)

## 2015-09-26 LAB — PROTIME-INR
INR: 1.3 — ABNORMAL HIGH
PROTHROMBIN TIME: 13.4 s — AB (ref 9.0–11.5)

## 2015-10-19 ENCOUNTER — Other Ambulatory Visit: Payer: Self-pay | Admitting: Internal Medicine

## 2015-10-19 DIAGNOSIS — Z1231 Encounter for screening mammogram for malignant neoplasm of breast: Secondary | ICD-10-CM

## 2015-10-21 DIAGNOSIS — K7031 Alcoholic cirrhosis of liver with ascites: Secondary | ICD-10-CM

## 2015-10-22 ENCOUNTER — Telehealth (INDEPENDENT_AMBULATORY_CARE_PROVIDER_SITE_OTHER): Payer: Self-pay | Admitting: *Deleted

## 2015-10-22 NOTE — Telephone Encounter (Signed)
Patient's son, [name] called office. He states that he is very concerned about his mother. She has been in bed for 4 days.He feels that the way she is doing her Ammonia is high. She is confused, and the times she has gotten out of bed , her gait is very unsteady. Bumping into walls,ect but has not fallen. He says that he has watched her take all her medications. He also says that her abdomen looks like it has gotten a lot bigger. He has called her friend, [name]. She is coming and he is going to get her to the hospital. [name] will call our office back if he need too.

## 2015-10-29 ENCOUNTER — Ambulatory Visit
Admission: RE | Admit: 2015-10-29 | Discharge: 2015-10-29 | Disposition: A | Discharge status: Home / Self Care | Payer: Medicare Other | Source: Ambulatory Visit | Attending: Internal Medicine | Admitting: Internal Medicine

## 2015-10-29 DIAGNOSIS — Z1231 Encounter for screening mammogram for malignant neoplasm of breast: Secondary | ICD-10-CM

## 2015-11-09 ENCOUNTER — Inpatient Hospital Stay (HOSPITAL_COMMUNITY)
Admission: EM | Admit: 2015-11-09 | Discharge: 2015-11-11 | DRG: 948 | Disposition: A | Discharge status: Home Health | Payer: Medicare Other | Attending: Internal Medicine | Admitting: Internal Medicine

## 2015-11-09 ENCOUNTER — Encounter (HOSPITAL_COMMUNITY): Payer: Self-pay | Admitting: Emergency Medicine

## 2015-11-09 ENCOUNTER — Emergency Department (HOSPITAL_COMMUNITY): Payer: Medicare Other

## 2015-11-09 DIAGNOSIS — Z23 Encounter for immunization: Secondary | ICD-10-CM | POA: Diagnosis not present

## 2015-11-09 DIAGNOSIS — E119 Type 2 diabetes mellitus without complications: Secondary | ICD-10-CM

## 2015-11-09 DIAGNOSIS — E871 Hypo-osmolality and hyponatremia: Secondary | ICD-10-CM | POA: Diagnosis present

## 2015-11-09 DIAGNOSIS — R109 Unspecified abdominal pain: Secondary | ICD-10-CM | POA: Diagnosis present

## 2015-11-09 DIAGNOSIS — Z87442 Personal history of urinary calculi: Secondary | ICD-10-CM

## 2015-11-09 DIAGNOSIS — Z7682 Awaiting organ transplant status: Secondary | ICD-10-CM | POA: Diagnosis not present

## 2015-11-09 DIAGNOSIS — N390 Urinary tract infection, site not specified: Secondary | ICD-10-CM | POA: Diagnosis not present

## 2015-11-09 DIAGNOSIS — E1065 Type 1 diabetes mellitus with hyperglycemia: Secondary | ICD-10-CM | POA: Diagnosis not present

## 2015-11-09 DIAGNOSIS — E876 Hypokalemia: Secondary | ICD-10-CM | POA: Diagnosis present

## 2015-11-09 DIAGNOSIS — D696 Thrombocytopenia, unspecified: Secondary | ICD-10-CM | POA: Diagnosis not present

## 2015-11-09 DIAGNOSIS — Z79899 Other long term (current) drug therapy: Secondary | ICD-10-CM

## 2015-11-09 DIAGNOSIS — K7460 Unspecified cirrhosis of liver: Secondary | ICD-10-CM | POA: Diagnosis not present

## 2015-11-09 DIAGNOSIS — D500 Iron deficiency anemia secondary to blood loss (chronic): Secondary | ICD-10-CM | POA: Diagnosis present

## 2015-11-09 DIAGNOSIS — Z794 Long term (current) use of insulin: Secondary | ICD-10-CM

## 2015-11-09 DIAGNOSIS — Z833 Family history of diabetes mellitus: Secondary | ICD-10-CM

## 2015-11-09 DIAGNOSIS — I517 Cardiomegaly: Secondary | ICD-10-CM | POA: Diagnosis present

## 2015-11-09 DIAGNOSIS — B182 Chronic viral hepatitis C: Secondary | ICD-10-CM | POA: Diagnosis present

## 2015-11-09 DIAGNOSIS — K55069 Acute infarction of intestine, part and extent unspecified: Secondary | ICD-10-CM | POA: Diagnosis present

## 2015-11-09 DIAGNOSIS — Z8711 Personal history of peptic ulcer disease: Secondary | ICD-10-CM

## 2015-11-09 DIAGNOSIS — E869 Volume depletion, unspecified: Secondary | ICD-10-CM | POA: Diagnosis not present

## 2015-11-09 DIAGNOSIS — I8510 Secondary esophageal varices without bleeding: Secondary | ICD-10-CM | POA: Diagnosis not present

## 2015-11-09 DIAGNOSIS — D61818 Other pancytopenia: Secondary | ICD-10-CM | POA: Diagnosis not present

## 2015-11-09 DIAGNOSIS — R0602 Shortness of breath: Secondary | ICD-10-CM | POA: Diagnosis not present

## 2015-11-09 DIAGNOSIS — F329 Major depressive disorder, single episode, unspecified: Secondary | ICD-10-CM | POA: Diagnosis present

## 2015-11-09 DIAGNOSIS — Z90710 Acquired absence of both cervix and uterus: Secondary | ICD-10-CM

## 2015-11-09 DIAGNOSIS — R069 Unspecified abnormalities of breathing: Secondary | ICD-10-CM | POA: Diagnosis not present

## 2015-11-09 DIAGNOSIS — R188 Other ascites: Principal | ICD-10-CM | POA: Diagnosis present

## 2015-11-09 DIAGNOSIS — K7290 Hepatic failure, unspecified without coma: Secondary | ICD-10-CM | POA: Diagnosis not present

## 2015-11-09 DIAGNOSIS — F418 Other specified anxiety disorders: Secondary | ICD-10-CM | POA: Diagnosis present

## 2015-11-09 DIAGNOSIS — Z9049 Acquired absence of other specified parts of digestive tract: Secondary | ICD-10-CM

## 2015-11-09 DIAGNOSIS — Z8619 Personal history of other infectious and parasitic diseases: Secondary | ICD-10-CM | POA: Diagnosis present

## 2015-11-09 DIAGNOSIS — K7469 Other cirrhosis of liver: Secondary | ICD-10-CM | POA: Diagnosis present

## 2015-11-09 DIAGNOSIS — Z87891 Personal history of nicotine dependence: Secondary | ICD-10-CM | POA: Diagnosis not present

## 2015-11-09 DIAGNOSIS — Z91041 Radiographic dye allergy status: Secondary | ICD-10-CM

## 2015-11-09 DIAGNOSIS — R1084 Generalized abdominal pain: Secondary | ICD-10-CM | POA: Diagnosis not present

## 2015-11-09 DIAGNOSIS — I8500 Esophageal varices without bleeding: Secondary | ICD-10-CM | POA: Diagnosis present

## 2015-11-09 DIAGNOSIS — F419 Anxiety disorder, unspecified: Secondary | ICD-10-CM | POA: Diagnosis present

## 2015-11-09 DIAGNOSIS — Z9109 Other allergy status, other than to drugs and biological substances: Secondary | ICD-10-CM

## 2015-11-09 DIAGNOSIS — Z888 Allergy status to other drugs, medicaments and biological substances status: Secondary | ICD-10-CM

## 2015-11-09 DIAGNOSIS — Z8249 Family history of ischemic heart disease and other diseases of the circulatory system: Secondary | ICD-10-CM

## 2015-11-09 DIAGNOSIS — IMO0002 Reserved for concepts with insufficient information to code with codable children: Secondary | ICD-10-CM

## 2015-11-09 LAB — AMMONIA: Ammonia: 107 umol/L — ABNORMAL HIGH (ref 9–35)

## 2015-11-09 MED ORDER — ONDANSETRON HCL 4 MG/2ML IJ SOLN
4.0000 mg | Freq: Once | INTRAMUSCULAR | Status: AC
  Administered 2015-11-09: 4 mg via INTRAVENOUS
  Filled 2015-11-09: qty 2

## 2015-11-09 MED ORDER — FENTANYL CITRATE (PF) 100 MCG/2ML IJ SOLN
50.0000 ug | Freq: Once | INTRAMUSCULAR | Status: AC
Start: 2015-11-09 — End: 2015-11-09
  Administered 2015-11-09: 50 ug via INTRAVENOUS
  Filled 2015-11-09: qty 2

## 2015-11-09 NOTE — ED Provider Notes (Signed)
[facility] DEPT Provider Note   CSN: [id number] Arrival date & time: 11/09/15  2238  By signing my name below, I, Ephriam Jenkins, attest that this documentation has been prepared under the direction and in the presence of Ezequiel Essex, MD. Electronically signed, Ephriam Jenkins, ED Scribe. 11/09/15. 11:57 PM.  History   Chief Complaint Chief Complaint  Patient presents with  . Abdominal edema    HPI HPI Comments: [name] is a 55 y.o. female with a Hx of liver disease, Hep C, DM, who presents to the Emergency Department complaining of constant abdominal pain and gradually worsening abdominal swelling, onset yesterday. Pt states her abdomen started to swell yesterday and became much worse today. She also reports 2 episodes of clear vomiting today. Pt also complains of shortness of breath with mild chest pain that she believes is due to the abdominal swelling. The chest pain has been intermittent tonight, lasts for a few seconds and then subsides. She had fluid drained from her abdomen in January of 2017. Pt states she has been on a liver transplant list for the past 15 years but is not a candidate because of 3 blood clots. She takes oxycodone at home for pain. She has not been taking Eliquis for the past 4 months. She denies any blood in her stools or vomit.   The history is provided by the patient. No language interpreter was used.    Past Medical History:  Diagnosis Date  . Anemia   . Anxiety   . Ascites   . 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 Complication of anesthesia   . Depression   . Diabetes mellitus type 2, controlled, without complications ([place])   . Enteropathy 07/31/2015   Portal hypertensive enteropathy per capsule study, with stigmata of bleeding.  . Esophageal varices ([place])   . Gastric ulcer 07/15/2015   per EGD; no stigmata of bleeding  . GI bleed 07/17/2015  . Gram-negative bacteremia 12/08/2011  . KDTOIZTI(458.0)    "monthly" (11/07/2012)  . Heart murmur   .  Hepatitis C    Hx: of Hep "C" it was eradicated  . History of hepatitis C - successfuly treated medically 07/14/2015  . Kidney stones   . Liver failure ([place])    "I'm on liver transplant list @ [facility]" (11/07/2012)  . PONV (postoperative nausea and vomiting)   . Portal vein thrombosis   . Renal insufficiency   . Right ureteral stone 12/07/2011  . 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 Type II diabetes mellitus ([place])    "not since gastric bypass" (11/07/2012)    Patient Active Problem List   Diagnosis Date Noted  . Alcoholic cirrhosis of liver with ascites ([place])   . Enteropathy 07/31/2015  . Anemia, blood loss   . Depression with anxiety 07/30/2015  . Esophageal varices in cirrhosis ([place]) 07/30/2015  . Gastritis determined by endoscopy 07/30/2015  . Hypotension 07/30/2015  . Hyponatremia 07/29/2015  . Hypokalemia 07/29/2015  . GI bleeding 07/29/2015  . Gastric ulcer 07/29/2015  . Leukopenia 07/29/2015  . AKI (acute kidney injury) ([place]) 07/29/2015  . Diabetes mellitus type 2, controlled, without complications ([place]) 99/83/3825  . GI bleed 07/17/2015  . Palliative care encounter   . Goals of care, counseling/discussion   . DNR (do not resuscitate) discussion   . 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 Melena 07/14/2015  . Hepatic cirrhosis due to chronic hepatitis C infection ([place]) 07/14/2015  . History of hepatitis C - successfuly  treated medically 07/14/2015  . Portal vein thrombosis 05/13/2014  . Other malaise and fatigue 08/05/2013  . Hypoxemia 08/05/2013  . Abdominal pain, right upper quadrant 05/29/2013  . SBP (spontaneous bacterial peritonitis) ([place]) 05/28/2013  . Distal radius fracture 10/25/2012  . Metatarsal bone fracture 10/25/2012  . Insomnia 09/25/2012  . Encephalopathy, hepatic ([place]) 06/07/2012  . Hyperglycemia 06/06/2012  . LUQ abdominal pain 06/05/2012  . Ascites 05/14/2012  . Gram-negative bacteremia 12/08/2011  . Pyelonephritis 12/07/2011    . Hydronephrosis of right kidney 12/07/2011  . Right ureteral stone 12/07/2011  . Thrombocytopenia ([place]) 12/07/2011  . Cirrhosis of liver ([place]) 05/02/2011  . Acute blood loss anemia 05/02/2011    Past Surgical History:  Procedure Laterality Date  . ABDOMINAL HYSTERECTOMY  2003  . CESAREAN SECTION  1995  . CHOLECYSTECTOMY  1987  . COLONOSCOPY    . COLONOSCOPY  10/21/2011   Procedure: COLONOSCOPY;  Surgeon: Rogene Houston, MD;  Location: [facility];  Service: Endoscopy;  Laterality: N/A;  245   . CYSTOSCOPY W/ RETROGRADES  12/08/2011   Procedure: CYSTOSCOPY WITH RETROGRADE PYELOGRAM;  Surgeon: Marissa Nestle, MD;  Location: [facility] ORS;  Service: Urology;  Laterality: Right;  . CYSTOSCOPY WITH STENT PLACEMENT  12/08/2011   Procedure: CYSTOSCOPY WITH STENT PLACEMENT;  Surgeon: Marissa Nestle, MD;  Location: [facility] OR[facility];  Service: Urology;  Laterality: Right;  . DILATION AND CURETTAGE OF UTERUS    . ESOPHAGOGASTRODUODENOSCOPY N/A 02/19/2014   Procedure: ESOPHAGOGASTRODUODENOSCOPY (EGD);  Surgeon: Rogene Houston, MD;  Location: [facility];  Service: Endoscopy;  Laterality: N/A;  100  . ESOPHAGOGASTRODUODENOSCOPY N/A 07/15/2015   Procedure: ESOPHAGOGASTRODUODENOSCOPY (EGD);  Surgeon: Rogene Houston, MD;  [facility];  Service: Endoscopy;  Laterality: N/A;  . ESOPHAGOGASTRODUODENOSCOPY N/A 07/29/2015   Procedure: ESOPHAGOGASTRODUODENOSCOPY (EGD);  Surgeon: Rogene Houston, MD;  Location[facility];  Service: Endoscopy;  Laterality: N/A;  . GASTRIC BYPASS  ~ 1995  . HERNIA REPAIR  04/14/11   "one in my bellybutton" (11/07/2012)  . INGUINAL HERNIA REPAIR Right    "maybe 2" (11/07/2012)  . KNEE ARTHROSCOPY Right   . LITHOTRIPSY     "several times" (11/07/2012)  . OPEN REDUCTION INTERNAL FIXATION (ORIF) DISTAL RADIAL FRACTURE Right 11/07/2012   Procedure: OPEN REDUCTION INTERNAL FIXATION (ORIF) RIGHT DISTAL RADIAL FRACTURE;  Surgeon: [name], MD;  Location: [facility];   Service: Orthopedics;  Laterality: Right;  . ORIF DISTAL RADIUS FRACTURE Right 11/07/2012  . UPPER GASTROINTESTINAL ENDOSCOPY      OB History    Gravida Para Term Preterm AB Living   1 1 1          SAB TAB Ectopic Multiple Live Births                   Home Medications    Prior to Admission medications   Medication Sig Start Date End Date Taking? Authorizing Provider  b complex vitamins tablet Take 1 tablet by mouth daily. Reported on 08/18/2015    Historical Provider, MD  baclofen (LIORESAL) 10 MG tablet Take 1 tablet (10 mg total) by mouth 3 (three) times daily as needed for muscle spasms (alternates with baclofen). 05/13/14   Rogene Houston, MD  cyclobenzaprine (FLEXERIL) 10 MG tablet Take 1 tablet (10 mg total) by mouth 3 (three) times daily as needed for muscle spasms. As needed 08/15/14   Rogene Houston, MD  escitalopram (LEXAPRO) 10 MG tablet Take 20 mg by mouth  daily. Reported on 08/18/2015 07/24/15   Historical Provider, MD  ferrous sulfate 325 (65 FE) MG tablet Take 325 mg by mouth 2 (two) times daily with a meal. Reported on 08/18/2015    Historical Provider, MD  furosemide (LASIX) 40 MG tablet Take 1 tablet (40 mg total) by mouth 3 (three) times a week. 08/19/15   Rogene Houston, MD  lactulose (CHRONULAC) 10 GM/15ML solution Take 45 mLs (30 g total) by mouth 3 (three) times daily. 09/24/15   Rogene Houston, MD  LANTUS SOLOSTAR 100 UNIT/ML Solostar Pen Inject 20 Units into the skin every evening. Reported on 08/18/2015 08/27/14   Historical Provider, MD  levofloxacin (LEVAQUIN) 500 MG tablet TAKE (1) TABLET BY MOUTH ONCE DAILY. 09/24/15   Rogene Houston, MD  Multiple Vitamin (MULTIVITAMIN WITH MINERALS) TABS tablet Take 1 tablet by mouth daily. Reported on 08/18/2015    Historical Provider, MD  oxyCODONE (OXY IR/ROXICODONE) 5 MG immediate release tablet Take 5 mg by mouth every 6 (six) hours as needed for severe pain.    Historical Provider, MD  pantoprazole (PROTONIX) 40 MG tablet Take  1 tablet (40 mg total) by mouth 2 (two) times daily before a meal. 09/24/15   Rogene Houston, MD  rifaximin (XIFAXAN) 550 MG TABS tablet Take 1 tablet (550 mg total) by mouth 2 (two) times daily. 09/24/15   Rogene Houston, MD  spironolactone (ALDACTONE) 100 MG tablet Take 1 tablet (100 mg total) by mouth daily. 09/24/15   Rogene Houston, MD  traZODone (DESYREL) 50 MG tablet Take 1 tablet (50 mg total) by mouth at bedtime. 08/15/14   Rogene Houston, MD    Family History Family History  Problem Relation Age of Onset  . Dementia Mother   . Heart disease Father   . Liver disease Father   . Hypertension Father   . Diabetes Father   . Dementia Father   . Hypertension Brother   . Other Son     Cervical dystonia    Social History Social History  Substance Use Topics  . Smoking status: Former Smoker    Packs/day: 0.50    Years: 5.00    Types: Cigarettes    Quit date: 05/02/1991  . Smokeless tobacco: Never Used  . Alcohol use No     Allergies   Ancef [cefazolin sodium]; Gadobenate; Iodinated diagnostic agents; and Tape   Review of Systems Review of Systems  Respiratory: Positive for shortness of breath.   Cardiovascular: Positive for chest pain.  Gastrointestinal: Positive for abdominal distention, abdominal pain and vomiting. Negative for blood in stool.  All other systems reviewed and are negative.    Physical Exam Updated Vital Signs BP 107/66 (BP Location: Left Arm)   Pulse 82   Temp 98.5 F (36.9 C) (Oral)   Resp 18   Ht 5\' 6"  (1.676 m)   Wt 143 lb (64.9 kg)   SpO2 96%   BMI 23.08 kg/m   Physical Exam  Constitutional: She is oriented to person, place, and time. She appears well-developed and well-nourished. No distress.  HENT:  Head: Normocephalic and atraumatic.  Mouth/Throat: Oropharynx is clear and moist. No oropharyngeal exudate.  Eyes: Conjunctivae and EOM are normal. Pupils are equal, round, and reactive to light.  Neck: Normal range of motion. Neck  supple.  No meningismus.  Cardiovascular: Normal rate, regular rhythm, normal heart sounds and intact distal pulses.   No murmur heard. Pulmonary/Chest: Effort normal and breath sounds normal. No  respiratory distress.  Abdominal: Soft. She exhibits distension. There is tenderness. There is no rebound and no guarding.  Distended abdomen. Mild diffuse tenderness  Genitourinary:  Genitourinary Comments: Chaperone present. Small external hemorrhoids.    Brown Stool, no gross blood, no fissures  Musculoskeletal: Normal range of motion. She exhibits no edema or tenderness.  Neurological: She is alert and oriented to person, place, and time. No cranial nerve deficit. She exhibits normal muscle tone. Coordination normal.  No ataxia on finger to nose bilaterally. No pronator drift. 5/5 strength throughout. CN 2-12 intact.Equal grip strength. Sensation intact.   Skin: Skin is warm. There is pallor.  Pale appearing  Psychiatric: She has a normal mood and affect. Her behavior is normal.  Nursing note and vitals reviewed.    ED Treatments / Results  DIAGNOSTIC STUDIES: Oxygen Saturation is 96% on RA, normal by my interpretation.  COORDINATION OF CARE: 11:10 PM-Will order labs and medication for pain. Discussed treatment plan with pt at bedside and pt agreed to plan.   Labs (all labs ordered are listed, but only abnormal results are displayed) Labs Reviewed  CBC WITH DIFFERENTIAL/PLATELET - Abnormal; Notable for the following:       Result Value   RBC 3.48 (*)    Hemoglobin 9.2 (*)    HCT 28.2 (*)    RDW 16.7 (*)    Platelets 64 (*)    Lymphs Abs 0.6 (*)    All other components within normal limits  COMPREHENSIVE METABOLIC PANEL - Abnormal; Notable for the following:    Sodium 131 (*)    Potassium 3.2 (*)    Chloride 96 (*)    Glucose, Bld 209 (*)    Calcium 8.2 (*)    Total Protein 6.1 (*)    Albumin 2.8 (*)    All other components within normal limits  PROTIME-INR - Abnormal;  Notable for the following:    Prothrombin Time 17.9 (*)    All other components within normal limits  URINALYSIS, ROUTINE W REFLEX MICROSCOPIC (NOT AT [facility]) - Abnormal; Notable for the following:    Leukocytes, UA TRACE (*)    All other components within normal limits  AMMONIA - Abnormal; Notable for the following:    Ammonia 107 (*)    All other components within normal limits  URINE MICROSCOPIC-ADD ON - Abnormal; Notable for the following:    Squamous Epithelial / LPF 6-30 (*)    Bacteria, UA MANY (*)    All other components within normal limits  POC OCCULT BLOOD, ED - Abnormal; Notable for the following:    Fecal Occult Bld POSITIVE (*)    All other components within normal limits  URINE CULTURE  LIPASE, BLOOD  TROPONIN I    EKG  EKG Interpretation  Date/Time:  Monday November 09 2015 23:24:06 EDT Ventricular Rate:  83 PR Interval:    QRS Duration: 85 QT Interval:  433 QTC Calculation: 509 R Axis:   -26 Text Interpretation:  Accelerated junctional rhythm Left ventricular hypertrophy Anterolateral infarct, age indeterminate T wave inversions III, avF, v2 Confirmed by Wyvonnia Dusky  MD, [place] [phone]) on 11/09/2015 11:29:43 PM       Radiology Dg Chest 2 View  Result Date: 11/10/2015 CLINICAL DATA:  Shortness of breath and abdominal distention for 1 day. Last paracentesis in January. History of hepatitis-C and liver failure. EXAM: CHEST  2 VIEW COMPARISON:  Acute abdominal series July 14, 2015 FINDINGS: Cardiomediastinal silhouette is normal. No pleural effusions or focal consolidations. Linear  density Trachea projects midline and there is no pneumothorax. Soft tissue planes and included osseous structures are non-suspicious. Surgical clips in the included right abdomen compatible with cholecystectomy. IMPRESSION: Lung base atelectasis. Electronically Signed   By: Elon Alas M.D.   On: 11/10/2015 00:32    Procedures Procedures (including critical care time)  Medications  Ordered in ED Medications - No data to display   Initial Impression / Assessment and Plan / ED Course  I have reviewed the triage vital signs and the nursing notes.  Pertinent labs & imaging results that were available during my care of the patient were reviewed by me and considered in my medical decision making (see chart for details).  Clinical Course   Patient with history of cirrhosis and hepatitis C presenting with abdominal swelling, distention and pain or shortness of breath onset yesterday. Denies fever. 2 episodes of nonbloody emesis today. Last paracentesis was in January.  Ammonia 107, but patient mental status normal. States she has been taking her lactulose.  Hemoglobin 9.2 from 10.5. EKG does show new T-wave inversions anteriorly in inferiorly. Troponin negative. Description of chest pain is atypical for ACS.  Moderate amount of ascites on [place]. No fever, no leukocytosis, no focal abdominal tenderness. SBP seems less likely.   Plan admission elevated ammonia, decreasing hemoglobin with positive FOBT. May benefit from therapeutic paracentesis under [place] as well.  D/w Dr. Myna Hidalgo.  EMERGENCY DEPARTMENT [place] ABD/AORTA EXAM Study: Limited Ultrasound of the Abdominal Aorta.  INDICATIONS:Abdominal pain Indication: Multiple views of the abdominal aorta are obtained from the diaphragmatic hiatus to the aortic bifurcation in transverse and sagittal planes with a multi- Frequency probe.  PERFORMED BY: Myself  IMAGES ARCHIVED?: Yes  FINDINGS: Free fluid present  LIMITATIONS:  Body habitus and Abdominal pain  INTERPRETATION:  Abdominal free fluid present  COMMENT:  Moderate ascites    Final Clinical Impressions(s) / ED Diagnoses   Final diagnoses:  Other ascites  Cirrhosis of liver with ascites, unspecified hepatic cirrhosis type ([place])  Ascites    New Prescriptions New Prescriptions   No medications on file  I personally performed the services described in this  documentation, which was scribed in my presence. The recorded information has been reviewed and is accurate.     Ezequiel Essex, MD 11/10/15 [phone]

## 2015-11-09 NOTE — ED Triage Notes (Signed)
Pt reports she has liver disease. Pt states she began having abdominal swelling yesterday. Edema became much worse today. Pt now having SOB and pain.

## 2015-11-10 ENCOUNTER — Ambulatory Visit (INDEPENDENT_AMBULATORY_CARE_PROVIDER_SITE_OTHER): Payer: Medicare Other | Admitting: Internal Medicine

## 2015-11-10 ENCOUNTER — Observation Stay (HOSPITAL_COMMUNITY): Payer: Medicare Other

## 2015-11-10 ENCOUNTER — Encounter (HOSPITAL_COMMUNITY): Payer: Self-pay | Admitting: Family Medicine

## 2015-11-10 DIAGNOSIS — E876 Hypokalemia: Secondary | ICD-10-CM

## 2015-11-10 DIAGNOSIS — Z794 Long term (current) use of insulin: Secondary | ICD-10-CM

## 2015-11-10 DIAGNOSIS — K7460 Unspecified cirrhosis of liver: Secondary | ICD-10-CM

## 2015-11-10 DIAGNOSIS — I8500 Esophageal varices without bleeding: Secondary | ICD-10-CM | POA: Diagnosis present

## 2015-11-10 DIAGNOSIS — E871 Hypo-osmolality and hyponatremia: Secondary | ICD-10-CM | POA: Diagnosis present

## 2015-11-10 DIAGNOSIS — B182 Chronic viral hepatitis C: Secondary | ICD-10-CM | POA: Diagnosis present

## 2015-11-10 DIAGNOSIS — I8510 Secondary esophageal varices without bleeding: Secondary | ICD-10-CM

## 2015-11-10 DIAGNOSIS — R109 Unspecified abdominal pain: Secondary | ICD-10-CM | POA: Diagnosis present

## 2015-11-10 DIAGNOSIS — F329 Major depressive disorder, single episode, unspecified: Secondary | ICD-10-CM | POA: Diagnosis present

## 2015-11-10 DIAGNOSIS — D696 Thrombocytopenia, unspecified: Secondary | ICD-10-CM | POA: Diagnosis present

## 2015-11-10 DIAGNOSIS — N390 Urinary tract infection, site not specified: Secondary | ICD-10-CM | POA: Diagnosis present

## 2015-11-10 DIAGNOSIS — Z23 Encounter for immunization: Secondary | ICD-10-CM | POA: Diagnosis not present

## 2015-11-10 DIAGNOSIS — I517 Cardiomegaly: Secondary | ICD-10-CM | POA: Diagnosis present

## 2015-11-10 DIAGNOSIS — Z8249 Family history of ischemic heart disease and other diseases of the circulatory system: Secondary | ICD-10-CM | POA: Diagnosis not present

## 2015-11-10 DIAGNOSIS — D500 Iron deficiency anemia secondary to blood loss (chronic): Secondary | ICD-10-CM

## 2015-11-10 DIAGNOSIS — Z7682 Awaiting organ transplant status: Secondary | ICD-10-CM | POA: Diagnosis not present

## 2015-11-10 DIAGNOSIS — Z87891 Personal history of nicotine dependence: Secondary | ICD-10-CM | POA: Diagnosis not present

## 2015-11-10 DIAGNOSIS — R188 Other ascites: Principal | ICD-10-CM

## 2015-11-10 DIAGNOSIS — R1084 Generalized abdominal pain: Secondary | ICD-10-CM

## 2015-11-10 DIAGNOSIS — E119 Type 2 diabetes mellitus without complications: Secondary | ICD-10-CM

## 2015-11-10 DIAGNOSIS — K7469 Other cirrhosis of liver: Secondary | ICD-10-CM | POA: Diagnosis present

## 2015-11-10 DIAGNOSIS — F419 Anxiety disorder, unspecified: Secondary | ICD-10-CM | POA: Diagnosis present

## 2015-11-10 DIAGNOSIS — Z8711 Personal history of peptic ulcer disease: Secondary | ICD-10-CM | POA: Diagnosis not present

## 2015-11-10 DIAGNOSIS — Z9049 Acquired absence of other specified parts of digestive tract: Secondary | ICD-10-CM | POA: Diagnosis not present

## 2015-11-10 DIAGNOSIS — Z90710 Acquired absence of both cervix and uterus: Secondary | ICD-10-CM | POA: Diagnosis not present

## 2015-11-10 DIAGNOSIS — E869 Volume depletion, unspecified: Secondary | ICD-10-CM | POA: Diagnosis present

## 2015-11-10 DIAGNOSIS — Z87442 Personal history of urinary calculi: Secondary | ICD-10-CM | POA: Diagnosis not present

## 2015-11-10 DIAGNOSIS — K7290 Hepatic failure, unspecified without coma: Secondary | ICD-10-CM | POA: Diagnosis present

## 2015-11-10 LAB — URINALYSIS, ROUTINE W REFLEX MICROSCOPIC
BILIRUBIN URINE: NEGATIVE
Glucose, UA: NEGATIVE mg/dL
Hgb urine dipstick: NEGATIVE
KETONES UR: NEGATIVE mg/dL
NITRITE: NEGATIVE
PH: 5.5 (ref 5.0–8.0)
Protein, ur: NEGATIVE mg/dL
Specific Gravity, Urine: 1.025 (ref 1.005–1.030)

## 2015-11-10 LAB — PROTIME-INR
INR: 1.46
PROTHROMBIN TIME: 17.9 s — AB (ref 11.4–15.2)

## 2015-11-10 LAB — GLUCOSE, CAPILLARY
GLUCOSE-CAPILLARY: 143 mg/dL — AB (ref 65–99)
GLUCOSE-CAPILLARY: 134 mg/dL — AB (ref 65–99)
GLUCOSE-CAPILLARY: 163 mg/dL — AB (ref 65–99)
GLUCOSE-CAPILLARY: 203 mg/dL — AB (ref 65–99)
GLUCOSE-CAPILLARY: 106 mg/dL — AB (ref 65–99)

## 2015-11-10 LAB — CBC WITH DIFFERENTIAL/PLATELET
BASOS ABS: 0 10*3/uL (ref 0.0–0.1)
Basophils Relative: 1 %
EOS PCT: 2 %
Eosinophils Absolute: 0.1 10*3/uL (ref 0.0–0.7)
HCT: 28.2 % — ABNORMAL LOW (ref 36.0–46.0)
HEMOGLOBIN: 9.2 g/dL — AB (ref 12.0–15.0)
LYMPHS ABS: 0.6 10*3/uL — AB (ref 0.7–4.0)
LYMPHS PCT: 12 %
MCH: 26.4 pg (ref 26.0–34.0)
MCHC: 32.6 g/dL (ref 30.0–36.0)
MCV: 81 fL (ref 78.0–100.0)
Monocytes Absolute: 0.4 10*3/uL (ref 0.1–1.0)
Monocytes Relative: 8 %
NEUTROS ABS: 3.8 10*3/uL (ref 1.7–7.7)
NEUTROS PCT: 77 %
PLATELETS: 64 10*3/uL — AB (ref 150–400)
RBC: 3.48 MIL/uL — AB (ref 3.87–5.11)
RDW: 16.7 % — ABNORMAL HIGH (ref 11.5–15.5)
SMEAR REVIEW: DECREASED
WBC: 4.9 10*3/uL (ref 4.0–10.5)

## 2015-11-10 LAB — GRAM STAIN

## 2015-11-10 LAB — BODY FLUID CELL COUNT WITH DIFFERENTIAL
Eos, Fluid: 0 %
LYMPHS FL: 62 %
MONOCYTE-MACROPHAGE-SEROUS FLUID: 37 % — AB (ref 50–90)
NEUTROPHIL FLUID: 1 % (ref 0–25)
Total Nucleated Cell Count, Fluid: 58 cu mm (ref 0–1000)

## 2015-11-10 LAB — COMPREHENSIVE METABOLIC PANEL
ALBUMIN: 2.8 g/dL — AB (ref 3.5–5.0)
ALK PHOS: 79 U/L (ref 38–126)
ALT: 26 U/L (ref 14–54)
ANION GAP: 6 (ref 5–15)
AST: 34 U/L (ref 15–41)
BUN: 14 mg/dL (ref 6–20)
CHLORIDE: 96 mmol/L — AB (ref 101–111)
CO2: 29 mmol/L (ref 22–32)
Calcium: 8.2 mg/dL — ABNORMAL LOW (ref 8.9–10.3)
Creatinine, Ser: 0.88 mg/dL (ref 0.44–1.00)
GFR calc non Af Amer: >60 mL/min (ref >60)
Glucose, Bld: 209 mg/dL — ABNORMAL HIGH (ref 65–99)
POTASSIUM: 3.2 mmol/L — AB (ref 3.5–5.1)
SODIUM: 131 mmol/L — AB (ref 135–145)
Total Bilirubin: 1.1 mg/dL (ref 0.3–1.2)
Total Protein: 6.1 g/dL — ABNORMAL LOW (ref 6.5–8.1)

## 2015-11-10 LAB — GLUCOSE, SEROUS FLUID: Glucose, Fluid: 148 mg/dL

## 2015-11-10 LAB — URINE MICROSCOPIC-ADD ON

## 2015-11-10 LAB — MAGNESIUM: Magnesium: 1.7 mg/dL (ref 1.7–2.4)

## 2015-11-10 LAB — POC OCCULT BLOOD, ED: FECAL OCCULT BLD: POSITIVE — AB

## 2015-11-10 LAB — LIPASE, BLOOD: LIPASE: 22 U/L (ref 11–51)

## 2015-11-10 LAB — TROPONIN I

## 2015-11-10 MED ORDER — INSULIN GLARGINE 100 UNIT/ML ~~LOC~~ SOLN
20.0000 [IU] | Freq: Every day | SUBCUTANEOUS | Status: DC
  Administered 2015-11-10: 20 [IU] via SUBCUTANEOUS
  Filled 2015-11-10 (×2): qty 0.2

## 2015-11-10 MED ORDER — INSULIN ASPART 100 UNIT/ML ~~LOC~~ SOLN
0.0000 [IU] | Freq: Three times a day (TID) | SUBCUTANEOUS | Status: DC
  Administered 2015-11-10: 2 [IU] via SUBCUTANEOUS
  Administered 2015-11-10: 3 [IU] via SUBCUTANEOUS
  Administered 2015-11-10: 1 [IU] via SUBCUTANEOUS
  Administered 2015-11-11: 3 [IU] via SUBCUTANEOUS
  Administered 2015-11-11: 2 [IU] via SUBCUTANEOUS

## 2015-11-10 MED ORDER — ALBUMIN HUMAN 25 % IV SOLN
INTRAVENOUS | Status: AC
  Filled 2015-11-10: qty 200

## 2015-11-10 MED ORDER — ESCITALOPRAM OXALATE 10 MG PO TABS
20.0000 mg | ORAL_TABLET | Freq: Every day | ORAL | Status: DC
  Administered 2015-11-10 – 2015-11-11 (×2): 20 mg via ORAL
  Filled 2015-11-10 (×2): qty 2

## 2015-11-10 MED ORDER — ONDANSETRON HCL 4 MG/2ML IJ SOLN: 4.0000 mg | Freq: Four times a day (QID) | INTRAMUSCULAR | Status: DC | PRN

## 2015-11-10 MED ORDER — CYCLOBENZAPRINE HCL 10 MG PO TABS: 10.0000 mg | ORAL_TABLET | Freq: Three times a day (TID) | ORAL | Status: DC | PRN

## 2015-11-10 MED ORDER — POLYETHYLENE GLYCOL 3350 17 G PO PACK: 17.0000 g | PACK | Freq: Every day | ORAL | Status: DC | PRN

## 2015-11-10 MED ORDER — POTASSIUM CHLORIDE CRYS ER 20 MEQ PO TBCR
40.0000 meq | EXTENDED_RELEASE_TABLET | Freq: Once | ORAL | Status: AC
  Administered 2015-11-10: 40 meq via ORAL
  Filled 2015-11-10: qty 2

## 2015-11-10 MED ORDER — FERROUS SULFATE 325 (65 FE) MG PO TABS
325.0000 mg | ORAL_TABLET | Freq: Two times a day (BID) | ORAL | Status: DC
  Administered 2015-11-10 – 2015-11-11 (×3): 325 mg via ORAL
  Filled 2015-11-10 (×3): qty 1

## 2015-11-10 MED ORDER — SPIRONOLACTONE 25 MG PO TABS
100.0000 mg | ORAL_TABLET | Freq: Every day | ORAL | Status: DC
  Administered 2015-11-10 – 2015-11-11 (×2): 100 mg via ORAL
  Filled 2015-11-10 (×2): qty 4

## 2015-11-10 MED ORDER — ADULT MULTIVITAMIN W/MINERALS CH
1.0000 | ORAL_TABLET | Freq: Every day | ORAL | Status: DC
  Administered 2015-11-10 – 2015-11-11 (×2): 1 via ORAL
  Filled 2015-11-10 (×2): qty 1

## 2015-11-10 MED ORDER — RIFAXIMIN 550 MG PO TABS
550.0000 mg | ORAL_TABLET | Freq: Two times a day (BID) | ORAL | Status: DC
  Administered 2015-11-10 – 2015-11-11 (×4): 550 mg via ORAL
  Filled 2015-11-10 (×4): qty 1

## 2015-11-10 MED ORDER — ONDANSETRON HCL 4 MG PO TABS: 4.0000 mg | ORAL_TABLET | Freq: Four times a day (QID) | ORAL | Status: DC | PRN

## 2015-11-10 MED ORDER — INSULIN ASPART 100 UNIT/ML ~~LOC~~ SOLN: 0.0000 [IU] | Freq: Every day | SUBCUTANEOUS | Status: DC

## 2015-11-10 MED ORDER — SODIUM CHLORIDE 0.9% FLUSH
3.0000 mL | Freq: Two times a day (BID) | INTRAVENOUS | Status: DC
  Administered 2015-11-10: 3 mL via INTRAVENOUS

## 2015-11-10 MED ORDER — CIPROFLOXACIN IN D5W 400 MG/200ML IV SOLN
400.0000 mg | Freq: Two times a day (BID) | INTRAVENOUS | Status: DC
  Administered 2015-11-10 – 2015-11-11 (×2): 400 mg via INTRAVENOUS
  Filled 2015-11-10 (×2): qty 200

## 2015-11-10 MED ORDER — B COMPLEX PO TABS: 1.0000 | ORAL_TABLET | Freq: Every day | ORAL | Status: DC

## 2015-11-10 MED ORDER — CIPROFLOXACIN IN D5W 400 MG/200ML IV SOLN
400.0000 mg | Freq: Once | INTRAVENOUS | Status: AC
  Administered 2015-11-10: 400 mg via INTRAVENOUS
  Filled 2015-11-10: qty 200

## 2015-11-10 MED ORDER — SODIUM CHLORIDE 0.9 % IV SOLN: 250.0000 mL | INTRAVENOUS | Status: DC | PRN

## 2015-11-10 MED ORDER — ACETAMINOPHEN 650 MG RE SUPP: 650.0000 mg | Freq: Four times a day (QID) | RECTAL | Status: DC | PRN

## 2015-11-10 MED ORDER — TRAZODONE HCL 50 MG PO TABS
50.0000 mg | ORAL_TABLET | Freq: Every day | ORAL | Status: DC
  Administered 2015-11-10: 50 mg via ORAL
  Filled 2015-11-10: qty 1

## 2015-11-10 MED ORDER — OXYCODONE HCL 5 MG PO TABS
5.0000 mg | ORAL_TABLET | Freq: Four times a day (QID) | ORAL | Status: DC | PRN
  Administered 2015-11-10 – 2015-11-11 (×3): 5 mg via ORAL
  Filled 2015-11-10 (×3): qty 1

## 2015-11-10 MED ORDER — B COMPLEX-C PO TABS
1.0000 | ORAL_TABLET | Freq: Every day | ORAL | Status: DC
  Administered 2015-11-10 – 2015-11-11 (×2): 1 via ORAL
  Filled 2015-11-10 (×4): qty 1

## 2015-11-10 MED ORDER — ALBUMIN HUMAN 25 % IV SOLN
25.0000 g | Freq: Once | INTRAVENOUS | Status: AC
  Administered 2015-11-10: 25 g via INTRAVENOUS
  Filled 2015-11-10: qty 100

## 2015-11-10 MED ORDER — HYDROMORPHONE HCL 1 MG/ML IJ SOLN
0.5000 mg | INTRAMUSCULAR | Status: DC | PRN
  Administered 2015-11-10: 1 mg via INTRAVENOUS
  Administered 2015-11-10: 0.5 mg via INTRAVENOUS
  Administered 2015-11-10 – 2015-11-11 (×3): 1 mg via INTRAVENOUS
  Filled 2015-11-10 (×5): qty 1

## 2015-11-10 MED ORDER — PANTOPRAZOLE SODIUM 40 MG PO TBEC
40.0000 mg | DELAYED_RELEASE_TABLET | Freq: Two times a day (BID) | ORAL | Status: DC
  Administered 2015-11-10 – 2015-11-11 (×3): 40 mg via ORAL
  Filled 2015-11-10 (×3): qty 1

## 2015-11-10 MED ORDER — ACETAMINOPHEN 325 MG PO TABS: 650.0000 mg | ORAL_TABLET | Freq: Four times a day (QID) | ORAL | Status: DC | PRN

## 2015-11-10 MED ORDER — LACTULOSE 10 GM/15ML PO SOLN
30.0000 g | Freq: Three times a day (TID) | ORAL | Status: DC
  Administered 2015-11-10 – 2015-11-11 (×4): 30 g via ORAL
  Filled 2015-11-10 (×4): qty 60

## 2015-11-10 MED ORDER — SODIUM CHLORIDE 0.9% FLUSH: 3.0000 mL | INTRAVENOUS | Status: DC | PRN

## 2015-11-10 MED ORDER — ALBUMIN HUMAN 25 % IV SOLN
50.0000 g | Freq: Once | INTRAVENOUS | Status: AC
  Administered 2015-11-11: 50 g via INTRAVENOUS
  Filled 2015-11-10: qty 200

## 2015-11-10 MED ORDER — SODIUM CHLORIDE 0.9% FLUSH
3.0000 mL | Freq: Two times a day (BID) | INTRAVENOUS | Status: DC
Start: 2015-11-10 — End: 2015-11-11
  Administered 2015-11-10 – 2015-11-11 (×4): 3 mL via INTRAVENOUS

## 2015-11-10 MED ORDER — FUROSEMIDE 40 MG PO TABS
40.0000 mg | ORAL_TABLET | ORAL | Status: DC
  Administered 2015-11-11: 40 mg via ORAL
  Filled 2015-11-10: qty 1

## 2015-11-10 MED ORDER — HEPARIN SODIUM (PORCINE) 5000 UNIT/ML IJ SOLN: 5000.0000 [IU] | Freq: Three times a day (TID) | INTRAMUSCULAR | Status: DC

## 2015-11-10 NOTE — ED Notes (Signed)
Pt ambulated to restroom with cane without difficulty

## 2015-11-10 NOTE — Progress Notes (Signed)
   [name] FTN:[id number] DOB: November 10, 1960 DOA: 11/09/2015 PCP: Glo Herring., MD  Brief narrative: 55 y/o ? UGIB while in [place] for SMV thrombosis 07/14/15-also h/o Esoph Varices  Colon 10/2011 x 2 adenoma +  Diverticula/hemorrhoids Hep C cirrhosis completing Rx-has been on 2 transplant lists [id number] [unc/[facility]] SMV thrombosis ty II DM on insulin Prior radius #  Admitted 11/10/15 with Abd pain and discomfort and found to have potenttial evidecne of Encephalopathy despite being on prophylaxis  Underwent 5 Liter paracentesis today Given Albumin Will advance diet, await Cultures as is on Proph abx and might be able to d/c 48 hr once culture returns  Rest as per HPI  Verneita Griffes, MD [facility] [phone]

## 2015-11-10 NOTE — Care Management Note (Signed)
Case Management Note  Patient Details  Name: [name] MRN: [id number] Date of Birth: 1960/02/28  Subjective/Objective: Patient adm from home, lives with family and is ind with ADL's. She reports having a cane and walker at home if needed. She has undergone a paracentesis today with 5 liters removed. She states that she no longer is active with HH, that she told them to stop coming after one visit and became active with Hospice.  She states that she also has terminated with hospice services. Offered patient option of resuming HH or Hospice, she is refuses both at this time and states she would like to talk with Dr. Laural Golden. Her PCP is Dr. Gerarda Fraction and she has [employer]. She reports no issues getting to appointments or affording medications.       Action/Plan: Anticipate DC home tomorrow if appropriate. Will follow.    Expected Discharge Date:        11/11/2015          Expected Discharge Plan:  [place]  In-House Referral:  NA  Discharge planning Services  CM Consult  Post Acute Care Choice:    Choice offered to:     DME Arranged:    DME Agency:     HH Arranged:    HH Agency:     Status of Service:  In process, will continue to follow  If discussed at Long Length of Stay Meetings, dates discussed:    Additional Comments:  Jerrie Gullo, Chauncey Reading, RN 11/10/2015, 2:36 PM

## 2015-11-10 NOTE — Sedation Documentation (Signed)
Procedure done.  Removed 5 liters of fluid.  Pt tolerated well.

## 2015-11-10 NOTE — Procedures (Signed)
INDICATION: [Cirrhosis.] EXAM: ULTRASOUND GUIDED [LEFT ABDOMINAL] PARACENTESIS GRIP-IR: Category: Fluids  Subcategory: [Paracentesis]  Follow-Up: [None]  MEDICATIONS: [None.] COMPLICATIONS: <50 hrs).\nSIR LEVEL D - Requires major therapy, prolonged hospitalization (>48 hours).\nSIR LEVEL E - Permanent adverse sequelae.\nSIR LEVEL F - Death.\nDelayed -","cue":"complications","grammar":"None immediate.  SIR Level A - No therapy, no consequence.  SIR LEVEL B - Normal therapy, includes overnight admission for observation.  SIR LEVEL C - Requires therapy, minor hospitalization (<48 hrs).  SIR LEVEL D - Requires major therapy, prolonged hospitalization (>48 hours).  SIR LEVEL E - Permanent adverse sequelae.  SIR LEVEL F - Death.  Delayed -","dictation":false}}'>[None immediate.] PROCEDURE: Informed written consent was obtained from the patient after a discussion of the risks, benefits and alternatives to treatment. A timeout was performed prior to the initiation of the procedure.  Initial ultrasound scanning demonstrates a [large] amount of ascites within the left abdominal space. The left lower abdomen was prepped and draped in the usual sterile fashion. 1% lidocaine with epinephrine was used for local anesthesia.   Following this, a [19 gauge, 7-cm, Yueh] catheter was introduced. An ultrasound image was saved for documentation purposes. The paracentesis was performed. The catheter was removed and a dressing was applied. The patient tolerated the procedure well without immediate post procedural complication.  FINDINGS: A total of approximately [5,000 cc] of [serous] fluid was removed. [Samples were sent to the laboratory as requested by the clinical team.] IMPRESSION:  Successful ultrasound-guided paracentesis yielding [5.0] liters of peritoneal fluid.

## 2015-11-10 NOTE — H&P (Signed)
History and Physical    [name] EHM:[id number] DOB: 11-Feb-1960 DOA: 11/09/2015  PCP: Glo Herring., MD   Patient coming from: Home  Chief Complaint: Abdominal pain and distension, SOB  HPI: [name] is a 55 y.o. 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 Patient reports that she had been in her usual state of health until she noted her abdominal distention worsening over the past several days. By the day of her presentation, there was abdominal pain and dyspnea associated with this. Pain is described as moderate-severe, constant, localized to the mid abdomen, and with no alleviating or exacerbating factors. Patient reports prior therapeutic paracenteses, most recently in January. She has history of SBP and takes Levaquin as prophylaxis. She denies any recent fevers or chills. She had 2 episodes of nonbloody nonbilious vomiting, but denies change in bowel habits, melena, or hematochezia.   ED Course: Upon arrival to the ED, patient is found to be afebrile, saturating well on room air, and with vital signs stable. EKG demonstrates an accelerated junctional rhythm with nonspecific T-wave abnormality in the precordial leads. Troponin is undetectable. Chest x-ray is notable for atelectasis in the bases and urinalysis is consistent with infection. CMP features a hyponatremia and hypokalemia, as well as serum albumin of 2.8. CBC is notable for a normocytic anemia with hemoglobin of 9.2, down from 10.2 one month prior. CBC also features a stable thrombocytopenia with platelet count of 64,000. Ammonia is elevated to 107 and DRE produces brown stool which is FOBT positive. Patient was  provided symptomatic care with fentanyl and Zofran in the emergency department. She remained hemodynamically stable and in no acute respiratory distress. She'll be observed on the telemetry unit for ongoing evaluation and management of abdominal distention with pain and dyspnea, suspected secondary to refractory ascites.   Review of Systems:  All other systems reviewed and apart from HPI, are negative.  Past Medical History:  Diagnosis Date  . Anemia   . Anxiety   . Ascites   . 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 Complication of anesthesia   . Depression   . Diabetes mellitus type 2, controlled, without complications ([place])   . Enteropathy 07/31/2015   Portal hypertensive enteropathy per capsule study, with stigmata of bleeding.  . Esophageal varices ([place])   . Gastric ulcer 07/15/2015   per EGD; no stigmata of bleeding  . GI bleed 07/17/2015  . Gram-negative bacteremia 12/08/2011  . ZMOQHUTM(546.5)    "monthly" (11/07/2012)  . Heart murmur   . Hepatitis C    Hx: of Hep "C" it was eradicated  . History of hepatitis C - successfuly treated medically 07/14/2015  . Kidney stones   . Liver failure ([place])    "I'm on liver transplant list @ [facility]" (11/07/2012)  . PONV (postoperative nausea and vomiting)   . Portal vein thrombosis   . Renal insufficiency   . Right ureteral stone 12/07/2011  . 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 Type II diabetes mellitus ([place])    "not since gastric bypass" (11/07/2012)    Past Surgical History:  Procedure Laterality Date  . ABDOMINAL HYSTERECTOMY  2003  . CESAREAN SECTION  1995  . CHOLECYSTECTOMY  1987  . COLONOSCOPY    .  COLONOSCOPY  10/21/2011   Procedure: COLONOSCOPY;  Surgeon: Rogene Houston, MD;  [facility];  Service: Endoscopy;  Laterality: N/A;  245   . CYSTOSCOPY W/ RETROGRADES  12/08/2011   Procedure: CYSTOSCOPY WITH RETROGRADE PYELOGRAM;  Surgeon: Marissa Nestle, MD;  Location: [facility];  Service: Urology;  Laterality:  Right;  . CYSTOSCOPY WITH STENT PLACEMENT  12/08/2011   Procedure: CYSTOSCOPY WITH STENT PLACEMENT;  Surgeon: Marissa Nestle, MD;  Location: [facility];  Service: Urology;  Laterality: Right;  . DILATION AND CURETTAGE OF UTERUS    . ESOPHAGOGASTRODUODENOSCOPY N/A 02/19/2014   Procedure: ESOPHAGOGASTRODUODENOSCOPY (EGD);  Surgeon: Rogene Houston, MD;  [facility];  Service: Endoscopy;  Laterality: N/A;  100  . ESOPHAGOGASTRODUODENOSCOPY N/A 07/15/2015   Procedure: ESOPHAGOGASTRODUODENOSCOPY (EGD);  Surgeon: Rogene Houston, MD;  [facility];  Service: Endoscopy;  Laterality: N/A;  . ESOPHAGOGASTRODUODENOSCOPY N/A 07/29/2015   Procedure: ESOPHAGOGASTRODUODENOSCOPY (EGD);  Surgeon: Rogene Houston, MD;  [facility];  Service: Endoscopy;  Laterality: N/A;  . GASTRIC BYPASS  ~ 1995  . HERNIA REPAIR  04/14/11   "one in my bellybutton" (11/07/2012)  . INGUINAL HERNIA REPAIR Right    "maybe 2" (11/07/2012)  . KNEE ARTHROSCOPY Right   . LITHOTRIPSY     "several times" (11/07/2012)  . OPEN REDUCTION INTERNAL FIXATION (ORIF) DISTAL RADIAL FRACTURE Right 11/07/2012   Procedure: OPEN REDUCTION INTERNAL FIXATION (ORIF) RIGHT DISTAL RADIAL FRACTURE;  Surgeon: [name], MD;  Location: [facility];  Service: Orthopedics;  Laterality: Right;  . ORIF DISTAL RADIUS FRACTURE Right 11/07/2012  . UPPER GASTROINTESTINAL ENDOSCOPY       reports that she quit smoking about 24 years ago. Her smoking use included Cigarettes. She has a 2.50 pack-year smoking history. She has never used smokeless tobacco. She reports that she does not drink alcohol or use drugs.  Allergies  Allergen Reactions  . Ancef [Cefazolin Sodium] Other (See Comments)    "Blisters in mouth and vagina" Tolerates Penicillins  . Gadobenate Itching    Nausea and vomiting reported by patient post injection of multihance.  Pt states itching and throat redness after receiving MRI contrast.  . Iodinated Diagnostic Agents  Rash    Patient reports itching, nausea and vomiting.  . Tape Rash    Blisters skin.    Family History  Problem Relation Age of Onset  . Dementia Mother   . Heart disease Father   . Liver disease Father   . Hypertension Father   . Diabetes Father   . Dementia Father   . Hypertension Brother   . Other Son     Cervical dystonia     Prior to Admission medications   Medication Sig Start Date End Date Taking? Authorizing Provider  b complex vitamins tablet Take 1 tablet by mouth daily. Reported on 08/18/2015    Historical Provider, MD  baclofen (LIORESAL) 10 MG tablet Take 1 tablet (10 mg total) by mouth 3 (three) times daily as needed for muscle spasms (alternates with baclofen). 05/13/14   Rogene Houston, MD  cyclobenzaprine (FLEXERIL) 10 MG tablet Take 1 tablet (10 mg total) by mouth 3 (three) times daily as needed for muscle spasms. As needed 08/15/14   Rogene Houston, MD  escitalopram (LEXAPRO) 10 MG tablet Take 20 mg by mouth daily. Reported on 08/18/2015 07/24/15   Historical Provider, MD  ferrous sulfate 325 (65 FE) MG tablet Take 325 mg by mouth 2 (two)  times daily with a meal. Reported on 08/18/2015    Historical Provider, MD  furosemide (LASIX) 40 MG tablet Take 1 tablet (40 mg total) by mouth 3 (three) times a week. 08/19/15   Rogene Houston, MD  lactulose (CHRONULAC) 10 GM/15ML solution Take 45 mLs (30 g total) by mouth 3 (three) times daily. 09/24/15   Rogene Houston, MD  LANTUS SOLOSTAR 100 UNIT/ML Solostar Pen Inject 20 Units into the skin every evening. Reported on 08/18/2015 08/27/14   Historical Provider, MD  levofloxacin (LEVAQUIN) 500 MG tablet TAKE (1) TABLET BY MOUTH ONCE DAILY. 09/24/15   Rogene Houston, MD  Multiple Vitamin (MULTIVITAMIN WITH MINERALS) TABS tablet Take 1 tablet by mouth daily. Reported on 08/18/2015    Historical Provider, MD  oxyCODONE (OXY IR/ROXICODONE) 5 MG immediate release tablet Take 5 mg by mouth every 6 (six) hours as needed for severe pain.     Historical Provider, MD  pantoprazole (PROTONIX) 40 MG tablet Take 1 tablet (40 mg total) by mouth 2 (two) times daily before a meal. 09/24/15   Rogene Houston, MD  rifaximin (XIFAXAN) 550 MG TABS tablet Take 1 tablet (550 mg total) by mouth 2 (two) times daily. 09/24/15   Rogene Houston, MD  spironolactone (ALDACTONE) 100 MG tablet Take 1 tablet (100 mg total) by mouth daily. 09/24/15   Rogene Houston, MD  traZODone (DESYREL) 50 MG tablet Take 1 tablet (50 mg total) by mouth at bedtime. 08/15/14   Rogene Houston, MD    Physical Exam: Vitals:   11/09/15 2333 11/09/15 2334 11/10/15 0158 11/10/15 0245  BP: 107/66 107/66 (!) 105/52 122/61  Pulse: 82 82 75 76  Resp:  18  16  Temp:    98.7 F (37.1 C)  TempSrc:    Oral  SpO2: 96% 96% 96% 99%  Weight:    75 kg (165 lb 4.8 oz)  Height:    5\' 6"  (1.676 m)      Constitutional: NAD, calm, comfortable Eyes: PERTLA, lids and conjunctivae normal ENMT: Mucous membranes are moist. Posterior pharynx clear of any exudate or lesions.   Neck: normal, supple, no masses, no thyromegaly Respiratory: clear to auscultation bilaterally, no wheezing, no crackles. Normal respiratory effort.    Cardiovascular: S1 & S2 heard, regular rate and rhythm, grade 3 holosystolic murmur at RSB. No extremity edema. Neck vein distension. Abdomen: Distended, but not taut; tender throughout, no guarding or rebound pain. Bowel sounds normal.  Musculoskeletal: no clubbing / cyanosis. No joint deformity upper and lower extremities.    Skin: no significant rashes, lesions, ulcers. Warm, dry, well-perfused. Neurologic: CN 2-12 grossly intact. Sensation intact, DTR normal. Strength 5/5 in all 4 limbs.  Psychiatric: Normal judgment and insight. Alert and oriented x 3. Normal mood and affect.     Labs on Admission: I have personally reviewed following labs and imaging studies  CBC:  Recent Labs Lab 11/09/15 2322  WBC 4.9  NEUTROABS 3.8  HGB 9.2*  HCT 28.2*  MCV 81.0    PLT 64*   Basic Metabolic Panel:  Recent Labs Lab 11/09/15 2322  NA 131*  K 3.2*  CL 96*  CO2 29  GLUCOSE 209*  BUN 14  CREATININE 0.88  CALCIUM 8.2*   GFR: Estimated Creatinine Clearance: 75.7 mL/min (by C-G formula based on SCr of 0.88 mg/dL). Liver Function Tests:  Recent Labs Lab 11/09/15 2322  AST 34  ALT 26  ALKPHOS 79  BILITOT 1.1  PROT 6.1*  ALBUMIN 2.8*    Recent Labs Lab 11/09/15 2322  LIPASE 22    Recent Labs Lab 11/09/15 2322  AMMONIA 107*   Coagulation Profile:  Recent Labs Lab 11/09/15 2322  INR 1.46   Cardiac Enzymes:  Recent Labs Lab 11/09/15 2322  TROPONINI <0.03   BNP (last 3 results) No results for input(s): PROBNP in the last 8760 hours. HbA1C: No results for input(s): HGBA1C in the last 72 hours. CBG: No results for input(s): GLUCAP in the last 168 hours. Lipid Profile: No results for input(s): CHOL, HDL, LDLCALC, TRIG, CHOLHDL, LDLDIRECT in the last 72 hours. Thyroid Function Tests: No results for input(s): TSH, T4TOTAL, FREET4, T3FREE, THYROIDAB in the last 72 hours. Anemia Panel: No results for input(s): VITAMINB12, FOLATE, FERRITIN, TIBC, IRON, RETICCTPCT in the last 72 hours. Urine analysis:    Component Value Date/Time   COLORURINE YELLOW 11/09/2015 [place] 11/09/2015 2309   LABSPEC 1.025 11/09/2015 2309   PHURINE 5.5 11/09/2015 2309   GLUCOSEU NEGATIVE 11/09/2015 2309   HGBUR NEGATIVE 11/09/2015 2309   BILIRUBINUR NEGATIVE 11/09/2015 2309   KETONESUR NEGATIVE 11/09/2015 2309   PROTEINUR NEGATIVE 11/09/2015 2309   UROBILINOGEN 0.2 05/28/2013 0206   NITRITE NEGATIVE 11/09/2015 2309   LEUKOCYTESUR TRACE (A) 11/09/2015 2309   Sepsis Labs: @LABRCNTIP (procalcitonin:4,lacticidven:4) )No results found for this or any previous visit (from the past 240 hour(s)).   Radiological Exams on Admission: Dg Chest 2 View  Result Date: 11/10/2015 CLINICAL DATA:  Shortness of breath and abdominal  distention for 1 day. Last paracentesis in January. History of hepatitis-C and liver failure. EXAM: CHEST  2 VIEW COMPARISON:  Acute abdominal series July 14, 2015 FINDINGS: Cardiomediastinal silhouette is normal. No pleural effusions or focal consolidations. Linear density Trachea projects midline and there is no pneumothorax. Soft tissue planes and included osseous structures are non-suspicious. Surgical clips in the included right abdomen compatible with cholecystectomy. IMPRESSION: Lung base atelectasis. Electronically Signed   By: Elon Alas M.D.   On: 11/10/2015 00:32    EKG: Independently reviewed. Accelarated junctional rhythm, LVH, non-specific T-wave abnormality in precordial leads  Assessment/Plan  1. Abdominal pain  - Suspected secondary to refractory ascites; UTI may be contributing - She has hx of SBP and is on Levaquin ppx; there is no fever, chills, or leukocytosis   - Plan for therapeutic paracentesis; fluid-studies ordered to exclude SBP  - She received empiric ciprofloxacin in ED for the UTI, will continue this; Cipro typically covers SBP, but given her use of Levaquin as ppx, would prefer a 3rd gen cephalosporin but she had a serious reaction to Ancef previously  - Symptomatic care with antiemetics and analgesia while awaiting paracentesis   2. Liver cirrhosis with ascites - Attributed to Hep C, which has since been treated successfully  - She follows with Dr. Laural Golden of GI  - She has known varices, not on beta-blocker; hx of SBP on Levaquin ppx  - Ammonia is elevated to 107 on admission, but pt denies confusion and there is no encephalopathy evident clinically  - Continue Aldactone and Lasix, lactulose and Xifaxin  - US paracentesis requested for therapeutic purposes, and to exclude recurrent SBP as source of pain; albumin ordered to be given immediately following paracentesis   3. Anemia, thrombocytopenia  - Hgb 9.2 on admission, down from 10.2 in August; pt denies  melena or hematochezia, but stool is positive for occult blood - Platelet 64,000 and stable  - She had recently been managed for  recurrent GIB while on anticoagulation for SMV thrombosis and was taken off of AC  - Continue iron and B-vitamin supplementation; monitor  4. Insulin-dependent DM - A1c 6.7% in April 2015; serum glucose 209 on admission  - Managed with Lantus 20 units qHS at home, will continue this  - Check CBG with meals and qHS and start a low intensity sliding-scale correctional   5. Hyponatremia, hypokalemia - Serum sodium 131 on admission, chronically low, likely d/t cirrhosis and effective arterial volume depletion   - Potassium 3.2 on admission, likely d/t the vomiting; 40 mEq oral potassium given and magnesium level pending   DVT prophylaxis: SCD's Code Status: Full   Family Communication: Discussed Disposition Plan: Observe on telemetry Consults called: None Admission status: Observation     Vianne Bulls, MD [facility] Pager [phone]  If 7PM-7AM, please contact night-coverage [email or web address] Password [email or web address]  11/10/2015, 2:58 AM

## 2015-11-10 NOTE — Care Management Obs Status (Signed)
[place] NOTIFICATION   Patient Details  Name: [name] MRN: [id number] Date of Birth: [birth year]   Medicare Observation Status Notification Given:  Yes    Tazia Illescas, Chauncey Reading, RN 11/10/2015, 2:42 PM

## 2015-11-11 ENCOUNTER — Other Ambulatory Visit (INDEPENDENT_AMBULATORY_CARE_PROVIDER_SITE_OTHER): Payer: Self-pay | Admitting: *Deleted

## 2015-11-11 ENCOUNTER — Encounter (INDEPENDENT_AMBULATORY_CARE_PROVIDER_SITE_OTHER): Payer: Self-pay | Admitting: Internal Medicine

## 2015-11-11 ENCOUNTER — Inpatient Hospital Stay (HOSPITAL_COMMUNITY): Payer: Medicare Other

## 2015-11-11 DIAGNOSIS — R188 Other ascites: Secondary | ICD-10-CM

## 2015-11-11 LAB — GLUCOSE, CAPILLARY
GLUCOSE-CAPILLARY: 162 mg/dL — AB (ref 65–99)
Glucose-Capillary: 229 mg/dL — ABNORMAL HIGH (ref 65–99)

## 2015-11-11 LAB — CBC WITH DIFFERENTIAL/PLATELET
Basophils Absolute: 0 10*3/uL (ref 0.0–0.1)
Basophils Relative: 1 %
EOS PCT: 3 %
Eosinophils Absolute: 0.1 10*3/uL (ref 0.0–0.7)
HEMATOCRIT: 24.8 % — AB (ref 36.0–46.0)
Hemoglobin: 8.1 g/dL — ABNORMAL LOW (ref 12.0–15.0)
LYMPHS ABS: 0.4 10*3/uL — AB (ref 0.7–4.0)
LYMPHS PCT: 13 %
MCH: 26.8 pg (ref 26.0–34.0)
MCHC: 32.7 g/dL (ref 30.0–36.0)
MCV: 82.1 fL (ref 78.0–100.0)
MONO ABS: 0.4 10*3/uL (ref 0.1–1.0)
Monocytes Relative: 11 %
NEUTROS ABS: 2.3 10*3/uL (ref 1.7–7.7)
Neutrophils Relative %: 71 %
PLATELETS: 52 10*3/uL — AB (ref 150–400)
RBC: 3.02 MIL/uL — AB (ref 3.87–5.11)
RDW: 16.6 % — ABNORMAL HIGH (ref 11.5–15.5)
WBC: 3.2 10*3/uL — AB (ref 4.0–10.5)

## 2015-11-11 LAB — URINE CULTURE: Culture: <10000 — AB

## 2015-11-11 LAB — COMPREHENSIVE METABOLIC PANEL
ALK PHOS: 99 U/L (ref 38–126)
ALT: 38 U/L (ref 14–54)
ANION GAP: 5 (ref 5–15)
AST: 48 U/L — ABNORMAL HIGH (ref 15–41)
Albumin: 3.1 g/dL — ABNORMAL LOW (ref 3.5–5.0)
BILIRUBIN TOTAL: 1.3 mg/dL — AB (ref 0.3–1.2)
BUN: 10 mg/dL (ref 6–20)
CO2: 28 mmol/L (ref 22–32)
Calcium: 8 mg/dL — ABNORMAL LOW (ref 8.9–10.3)
Chloride: 95 mmol/L — ABNORMAL LOW (ref 101–111)
Creatinine, Ser: 0.9 mg/dL (ref 0.44–1.00)
GFR calc non Af Amer: >60 mL/min (ref >60)
GLUCOSE: 254 mg/dL — AB (ref 65–99)
Potassium: 3.4 mmol/L — ABNORMAL LOW (ref 3.5–5.1)
Sodium: 128 mmol/L — ABNORMAL LOW (ref 135–145)
Total Protein: 5.7 g/dL — ABNORMAL LOW (ref 6.5–8.1)

## 2015-11-11 LAB — PATHOLOGIST SMEAR REVIEW

## 2015-11-11 MED ORDER — TRAZODONE HCL 50 MG PO TABS: 50.0000 mg | ORAL_TABLET | Freq: Every day | ORAL | 0 refills | Status: DC

## 2015-11-11 MED ORDER — INFLUENZA VAC SPLIT QUAD 0.5 ML IM SUSY
0.5000 mL | PREFILLED_SYRINGE | INTRAMUSCULAR | Status: AC
  Administered 2015-11-11: 0.5 mL via INTRAMUSCULAR
  Filled 2015-11-11: qty 0.5

## 2015-11-11 MED ORDER — POTASSIUM CHLORIDE 20 MEQ/15ML (10%) PO SOLN
40.0000 meq | Freq: Once | ORAL | Status: AC
  Administered 2015-11-11: 40 meq via ORAL
  Filled 2015-11-11: qty 30

## 2015-11-11 MED ORDER — ALBUMIN HUMAN 25 % IV SOLN
50.0000 g | Freq: Once | INTRAVENOUS | Status: DC | PRN
  Filled 2015-11-11: qty 200

## 2015-11-11 NOTE — Discharge Instructions (Signed)
Follow with Primary MD Glo Herring., MD in 5 days   Get CBC, CMP, Ammonia, 2 view Chest X ray checked  by Primary MD or SNF MD in 5  days ( we routinely change or add medications that can affect your baseline labs and fluid status, therefore we recommend that you get the mentioned basic workup next visit with your PCP, your PCP may decide not to get them or add new tests based on their clinical decision)   Activity: As tolerated with Full fall precautions use walker/cane & assistance as needed   Disposition Home     Diet:   Heart Healthy Low carb.- Check your Weight same time everyday, if you gain over 2 pounds, or you develop in leg swelling, experience more shortness of breath or chest pain, call your Primary MD immediately. Follow Cardiac Low Salt Diet and 1.5 lit/day fluid restriction.   On your next visit with your primary care physician please Get Medicines reviewed and adjusted.   Please request your Prim.MD to go over all Hospital Tests and Procedure/Radiological results at the follow up, please get all Hospital records sent to your Prim MD by signing hospital release before you go home.   If you experience worsening of your admission symptoms, develop shortness of breath, life threatening emergency, suicidal or homicidal thoughts you must seek medical attention immediately by calling 911 or calling your MD immediately  if symptoms less severe.  You Must read complete instructions/literature along with all the possible adverse reactions/side effects for all the Medicines you take and that have been prescribed to you. Take any new Medicines after you have completely understood and accpet all the possible adverse reactions/side effects.   Do not drive, operate heavy machinery, perform activities at heights, swimming or participation in water activities or provide baby sitting services if your were admitted for syncope or siezures until you have seen by Primary MD or a Neurologist  and advised to do so again.  Do not drive when taking Pain medications.    Do not take more than prescribed Pain, Sleep and Anxiety Medications  Special Instructions: If you have smoked or chewed Tobacco  in the last 2 yrs please stop smoking, stop any regular Alcohol  and or any Recreational drug use.  Wear Seat belts while driving.   Please note  You were cared for by a hospitalist during your hospital stay. If you have any questions about your discharge medications or the care you received while you were in the hospital after you are discharged, you can call the unit and asked to speak with the hospitalist on call if the hospitalist that took care of you is not available. Once you are discharged, your primary care physician will handle any further medical issues. Please note that NO REFILLS for any discharge medications will be authorized once you are discharged, as it is imperative that you return to your primary care physician (or establish a relationship with a primary care physician if you do not have one) for your aftercare needs so that they can reassess your need for medications and monitor your lab values.

## 2015-11-11 NOTE — Discharge Summary (Signed)
[name] HWT:[id number] DOB: [birth year] DOA: 11/09/2015  PCP: Glo Herring., MD  Admit date: 11/09/2015  Discharge date: 11/11/2015  Admitted From: Home   Disposition:  Home   Recommendations for Outpatient Follow-up:   Follow up with PCP in 1-2 weeks  PCP Please obtain BMP/CBC, 2 view CXR in 1week,  (see Discharge instructions)   PCP Please follow up on the following pending results: None   Home Health: None   Equipment/Devices: None  Consultations: IR Discharge Condition: Fair   CODE STATUS: Full   Diet Recommendation: Heart Healthy Low Carb   Chief Complaint  Patient presents with  . Abdominal edema     Brief history of present illness from the day of admission and additional interim summary     [name] is a 55 y.o. 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 She underwent ultrasound-guided therapeutic paracentesis which was negative for any signs of SBP, so had possibly mild UTI cultures pending. After centesis her abdominal pain is resolved and she is now symptom-free. She still has some residual fluid left and I requested radiology for another therapeutic paracentesis which they have decided to do as an outpatient in the next few days. Thereafter she will be discharged home on Lasix, Aldactone, Xifaxan, lactulose combination. We'll request PCP to cut down her pain medications, muscle relaxants and  sedated psychotropics. Monitor mentation and intermittently check random Ammonia (trend) levels to monitor compliance with lactulose and Xifaxan.    Hospital issues addressed     1. [name] is a 55 y.o. 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 She underwent ultrasound-guided therapeutic paracentesis which was negative for any signs of SBP, so had possibly mild UTI cultures pending. After centesis her abdominal pain is resolved and she is now symptom-free. She still has some residual fluid left and I requested radiology for another therapeutic paracentesis which they have decided to do as an outpatient in the next few days. Thereafter she will be discharged home on Lasix, Aldactone, Xifaxan, lactulose combination   2. Chronic hepatitis C with ascites. Recommended to follow with Dr. Laural Golden in the outpatient setting, continue home medications as in #1 above along with chronic Levaquin for SBP prophylaxis.  3. Chronic anemia, thrombocytopenia. Due to cirrhosis. Supportive care.  4. Insulin-dependent DM type II. Last A1c 6.7, continue home regimen and follow up with  PCP for glycemic control.  5. Chronic hyponatremia due to third spacing of fluid due to cirrhosis, continue diuretics, counseled on free water and fluid salt restriction, request PCP to recheck CMP in 5-7 days. Hypokalemia has been replaced.   Discharge diagnosis     Principal Problem:   Abdominal pain Active Problems:   Cirrhosis of liver (HCC)   Thrombocytopenia (HCC)   Ascites   Superior mesenteric vein thrombosis   Hepatic  cirrhosis due to chronic hepatitis C infection ([place])   History of hepatitis C - successfuly treated medically   Hyponatremia   Hypokalemia   Diabetes mellitus type 2, controlled, without complications (HCC)   Depression with anxiety   Esophageal varices in cirrhosis (HCC)   Anemia, blood loss    Discharge instructions    Discharge Instructions    Discharge instructions    Complete by:  As directed    Follow with Primary MD Glo Herring., MD in 5 days   Get CBC, CMP, Ammonia, 2 view Chest X ray checked  by Primary MD or SNF MD in 5  days ( we routinely change or add medications that can affect your baseline labs and fluid status, therefore we recommend that you get the mentioned basic workup next visit with your PCP, your PCP may decide not to get them or add new tests based on their clinical decision)   Activity: As tolerated with Full fall precautions use walker/cane & assistance as needed   Disposition Home     Diet:   Heart Healthy Low carb.- Check your Weight same time everyday, if you gain over 2 pounds, or you develop in leg swelling, experience more shortness of breath or chest pain, call your Primary MD immediately. Follow Cardiac Low Salt Diet and 1.5 lit/day fluid restriction.   On your next visit with your primary care physician please Get Medicines reviewed and adjusted.   Please request your Prim.MD to go over all Hospital Tests and Procedure/Radiological results at the follow up, please get all Hospital records sent to your Prim MD by signing hospital release before you go home.   If you experience worsening of your admission symptoms, develop shortness of breath, life threatening emergency, suicidal or homicidal thoughts you must seek medical attention immediately by calling 911 or calling your MD immediately  if symptoms less severe.  You Must read complete instructions/literature along with all the possible adverse reactions/side effects for all the  Medicines you take and that have been prescribed to you. Take any new Medicines after you have completely understood and accpet all the possible adverse reactions/side effects.   Do not drive, operate heavy machinery, perform activities at heights, swimming or participation in water activities or provide baby sitting services if your were admitted for syncope or siezures until you have seen by Primary MD or a Neurologist and advised to do so again.  Do not drive when taking Pain medications.    Do not take more than prescribed Pain, Sleep and Anxiety Medications  Special Instructions: If you have smoked or chewed Tobacco  in the last 2 yrs please stop smoking, stop any regular Alcohol  and or any Recreational drug use.  Wear Seat belts while driving.   Please note  You were cared for by a hospitalist during your hospital stay. If you have any questions about your discharge medications or the care you received while you were in the hospital after you are discharged, you can call the unit and asked to speak  with the hospitalist on call if the hospitalist that took care of you is not available. Once you are discharged, your primary care physician will handle any further medical issues. Please note that NO REFILLS for any discharge medications will be authorized once you are discharged, as it is imperative that you return to your primary care physician (or establish a relationship with a primary care physician if you do not have one) for your aftercare needs so that they can reassess your need for medications and monitor your lab values.   Increase activity slowly    Complete by:  As directed       Discharge Medications     Medication List    STOP taking these medications   baclofen 10 MG tablet Commonly known as:  LIORESAL     TAKE these medications   b complex vitamins tablet Take 1 tablet by mouth daily. Reported on 08/18/2015   cyclobenzaprine 10 MG tablet Commonly known as:   FLEXERIL Take 1 tablet (10 mg total) by mouth 3 (three) times daily as needed for muscle spasms. As needed   escitalopram 10 MG tablet Commonly known as:  LEXAPRO Take 20 mg by mouth daily. Reported on 08/18/2015   ferrous sulfate 325 (65 FE) MG tablet Take 325 mg by mouth 2 (two) times daily with a meal. Reported on 08/18/2015   furosemide 40 MG tablet Commonly known as:  LASIX Take 1 tablet (40 mg total) by mouth 3 (three) times a week.   lactulose 10 GM/15ML solution Commonly known as:  CHRONULAC Take 45 mLs (30 g total) by mouth 3 (three) times daily.   LANTUS SOLOSTAR 100 UNIT/ML Solostar Pen Generic drug:  Insulin Glargine Inject 20 Units into the skin every evening. Reported on 08/18/2015   levofloxacin 500 MG tablet Commonly known as:  LEVAQUIN TAKE (1) TABLET BY MOUTH ONCE DAILY.   multivitamin with minerals Tabs tablet Take 1 tablet by mouth daily. Reported on 08/18/2015   pantoprazole 40 MG tablet Commonly known as:  PROTONIX Take 1 tablet (40 mg total) by mouth 2 (two) times daily before a meal.   rifaximin 550 MG Tabs tablet Commonly known as:  XIFAXAN Take 1 tablet (550 mg total) by mouth 2 (two) times daily.   spironolactone 100 MG tablet Commonly known as:  ALDACTONE Take 1 tablet (100 mg total) by mouth daily. What changed:  Another medication with the same name was removed. Continue taking this medication, and follow the directions you see here.   traZODone 50 MG tablet Commonly known as:  DESYREL Take 1 tablet (50 mg total) by mouth at bedtime. What changed:  medication strength  how much to take       Follow-up Information    FUSCO,LAWRENCE J., MD. Schedule an appointment as soon as possible for a visit in 3 day(s).   Specialty:  Internal Medicine Contact information: [address] [place] [address] [phone]        Hildred Laser, MD. Schedule an appointment as soon as possible for a visit in 1 week(s).   Specialty:   Gastroenterology Contact information: [place] [address] [phone]           Major procedures and Radiology Reports - PLEASE review detailed and final reports thoroughly  -      US - paracentesis  Dg Chest 2 View  Result Date: 11/10/2015 CLINICAL DATA:  Shortness of breath and abdominal distention for 1 day. Last paracentesis  in January. History of hepatitis-C and liver failure. EXAM: CHEST  2 VIEW COMPARISON:  Acute abdominal series July 14, 2015 FINDINGS: Cardiomediastinal silhouette is normal. No pleural effusions or focal consolidations. Linear density Trachea projects midline and there is no pneumothorax. Soft tissue planes and included osseous structures are non-suspicious. Surgical clips in the included right abdomen compatible with cholecystectomy. IMPRESSION: Lung base atelectasis. Electronically Signed   By: Elon Alas M.D.   On: 11/10/2015 00:32   US Paracentesis  Result Date: 11/10/2015 INDICATION: Cirrhosis. EXAM: ULTRASOUND GUIDED LEFT ABDOMINAL PARACENTESIS MEDICATIONS: None. COMPLICATIONS: None immediate. PROCEDURE: Informed written consent was obtained from the patient after a discussion of the risks, benefits and alternatives to treatment. A timeout was performed prior to the initiation of the procedure. Initial ultrasound scanning demonstrates a large amount of ascites within the left abdominal space. The left lower abdomen was prepped and draped in the usual sterile fashion. 1% lidocaine with epinephrine was used for local anesthesia. Following this, a 19 gauge, 7-cm, Yueh catheter was introduced. An ultrasound image was saved for documentation purposes. The paracentesis was performed. The catheter was removed and a dressing was applied. The patient tolerated the procedure well without immediate post procedural complication. FINDINGS: A total of approximately 5,000 cc of serous fluid was removed. Samples were sent to the laboratory as  requested by the clinical team. IMPRESSION: Successful ultrasound-guided paracentesis yielding 5.0 liters of peritoneal fluid. Electronically Signed   By: Abigail Miyamoto M.D.   On: 11/10/2015 11:40   Mm Screening Breast Tomo Bilateral  Result Date: 11/02/2015 CLINICAL DATA:  Screening. EXAM: 2D DIGITAL SCREENING BILATERAL MAMMOGRAM WITH CAD AND ADJUNCT TOMO COMPARISON:  Previous exam(s). ACR Breast Density Category a: The breast tissue is almost entirely fatty. FINDINGS: There are no findings suspicious for malignancy. Images were processed with CAD. IMPRESSION: No mammographic evidence of malignancy. A result letter of this screening mammogram will be mailed directly to the patient. RECOMMENDATION: Screening mammogram in one year. (Code:SM-B-01Y) BI-RADS CATEGORY  1: Negative. Electronically Signed   By: Altamese Cabal M.D.   On: 11/02/2015 16:33    Micro Results     Recent Results (from the past 240 hour(s))  Gram stain     Status: None   Collection Time: 11/10/15 11:30 AM  Result Value Ref Range Status   Specimen Description FLUID ASCITIC COLLECTED BY DOCTOR  Final   Special Requests NONE  Final   Gram Stain   Final    CYTOSPIN SMEAR WBC PRESENT, PREDOMINANTLY MONONUCLEAR NO ORGANISMS SEEN Performed at [facility]    Report Status 11/10/2015 FINAL  Final  Culture, body fluid-bottle     Status: None (Preliminary result)   Collection Time: 11/10/15 11:30 AM  Result Value Ref Range Status   Specimen Description FLUID ASCITIC COLLECTED BY DOCTOR  Final   Special Requests BOTTLES DRAWN AEROBIC AND ANAEROBIC 10CC  Final   Culture NO GROWTH < 24 HOURS  Final   Report Status PENDING  Incomplete    Today   Subjective    [name] today has no headache,no chest abdominal pain,no new weakness tingling or numbness, feels much better wants to go home today.    Objective   Blood pressure (!) 109/55, pulse 76, temperature 98 F (36.7 C), temperature source Oral, resp. rate 18,  height 5\' 6"  (1.676 m), weight 67.5 kg (148 lb 12.8 oz), SpO2 97 %.   Intake/Output Summary (Last 24 hours) at 11/11/15 1116 Last data filed at 11/11/15 0900  Gross per 24 hour  Intake             1140 ml  Output              400 ml  Net              740 ml    Exam Awake Alert, Oriented x 3, No new F.N deficits, Normal affect [place].AT,PERRAL Supple Neck,No JVD, No cervical lymphadenopathy appriciated.  Symmetrical Chest wall movement, Good air movement bilaterally, CTAB RRR,No Gallops,Rubs or new Murmurs, No Parasternal Heave +ve B.Sounds, Abd Soft but distended, Non tender, No organomegaly appriciated, No rebound -guarding or rigidity. No Cyanosis, Clubbing or edema, No new Rash or bruise   Data Review   CBC w Diff: Lab Results  Component Value Date   WBC 3.2 (L) 11/11/2015   HGB 8.1 (L) 11/11/2015   HCT 24.8 (L) 11/11/2015   PLT 52 (L) 11/11/2015   LYMPHOPCT 13 11/11/2015   MONOPCT 11 11/11/2015   EOSPCT 3 11/11/2015   BASOPCT 1 11/11/2015    CMP: Lab Results  Component Value Date   NA 128 (L) 11/11/2015   K 3.4 (L) 11/11/2015   CL 95 (L) 11/11/2015   CO2 28 11/11/2015   BUN 10 11/11/2015   CREATININE 0.90 11/11/2015   CREATININE 1.05 09/24/2015   PROT 5.7 (L) 11/11/2015   ALBUMIN 3.1 (L) 11/11/2015   BILITOT 1.3 (H) 11/11/2015   ALKPHOS 99 11/11/2015   AST 48 (H) 11/11/2015   ALT 38 11/11/2015  .   Total Time in preparing paper work, data evaluation and todays exam - 35 minutes  Thurnell Lose M.D on 11/11/2015 at 11:16 AM  [facility]   Office  [phone]

## 2015-11-11 NOTE — Care Management Note (Signed)
Case Management Note  Patient Details  Name: ERNESTINE ROHMAN MRN: [id number] Date of Birth: Mar 22, 1960  Expected Discharge Date:     11/11/2015             Expected Discharge Plan:  Ali Molina  In-House Referral:  NA  Discharge planning Services  CM Consult  Post Acute Care Choice:    Choice offered to:     DME Arranged:    DME Agency:     HH Arranged:    [place] Agency:     Status of Service:  Completed, signed off  If discussed at [employer] of Stay Meetings, dates discussed:    Additional Comments: Pt discharged home today with self care. No needs at the time of DC. Pt not interested in [facility] or hospice services.   Sherald Barge, RN 11/11/2015, 1:26 PM

## 2015-11-12 ENCOUNTER — Observation Stay (HOSPITAL_COMMUNITY)
Admission: EM | Admit: 2015-11-12 | Discharge: 2015-11-14 | Disposition: A | Discharge status: Home / Self Care | Payer: Medicare Other | Attending: Internal Medicine | Admitting: Internal Medicine

## 2015-11-12 ENCOUNTER — Emergency Department (HOSPITAL_COMMUNITY): Payer: Medicare Other

## 2015-11-12 ENCOUNTER — Encounter (HOSPITAL_COMMUNITY): Payer: Self-pay

## 2015-11-12 DIAGNOSIS — Z79899 Other long term (current) drug therapy: Secondary | ICD-10-CM | POA: Diagnosis not present

## 2015-11-12 DIAGNOSIS — R188 Other ascites: Secondary | ICD-10-CM

## 2015-11-12 DIAGNOSIS — Z794 Long term (current) use of insulin: Secondary | ICD-10-CM | POA: Diagnosis not present

## 2015-11-12 DIAGNOSIS — Z8619 Personal history of other infectious and parasitic diseases: Secondary | ICD-10-CM | POA: Diagnosis present

## 2015-11-12 DIAGNOSIS — E119 Type 2 diabetes mellitus without complications: Secondary | ICD-10-CM

## 2015-11-12 DIAGNOSIS — E1065 Type 1 diabetes mellitus with hyperglycemia: Secondary | ICD-10-CM | POA: Diagnosis not present

## 2015-11-12 DIAGNOSIS — R069 Unspecified abnormalities of breathing: Secondary | ICD-10-CM | POA: Diagnosis not present

## 2015-11-12 DIAGNOSIS — Z87891 Personal history of nicotine dependence: Secondary | ICD-10-CM | POA: Diagnosis not present

## 2015-11-12 DIAGNOSIS — K7460 Unspecified cirrhosis of liver: Secondary | ICD-10-CM | POA: Diagnosis not present

## 2015-11-12 DIAGNOSIS — D61818 Other pancytopenia: Secondary | ICD-10-CM | POA: Diagnosis present

## 2015-11-12 DIAGNOSIS — R0602 Shortness of breath: Secondary | ICD-10-CM | POA: Diagnosis not present

## 2015-11-12 LAB — CBC WITH DIFFERENTIAL/PLATELET
Basophils Absolute: 0 10*3/uL (ref 0.0–0.1)
Basophils Relative: 1 %
Eosinophils Absolute: 0.1 10*3/uL (ref 0.0–0.7)
Eosinophils Relative: 3 %
HCT: 27.7 % — ABNORMAL LOW (ref 36.0–46.0)
HEMOGLOBIN: 8.9 g/dL — AB (ref 12.0–15.0)
LYMPHS ABS: 0.4 10*3/uL — AB (ref 0.7–4.0)
LYMPHS PCT: 11 %
MCH: 26.6 pg (ref 26.0–34.0)
MCHC: 32.1 g/dL (ref 30.0–36.0)
MCV: 82.9 fL (ref 78.0–100.0)
MONO ABS: 0.3 10*3/uL (ref 0.1–1.0)
MONOS PCT: 10 %
NEUTROS ABS: 2.6 10*3/uL (ref 1.7–7.7)
Neutrophils Relative %: 75 %
Platelets: 60 10*3/uL — ABNORMAL LOW (ref 150–400)
RBC: 3.34 MIL/uL — ABNORMAL LOW (ref 3.87–5.11)
RDW: 16.7 % — ABNORMAL HIGH (ref 11.5–15.5)
SMEAR REVIEW: DECREASED
WBC: 3.5 10*3/uL — ABNORMAL LOW (ref 4.0–10.5)

## 2015-11-12 LAB — COMPREHENSIVE METABOLIC PANEL
ALT: 32 U/L (ref 14–54)
AST: 28 U/L (ref 15–41)
Albumin: 3.1 g/dL — ABNORMAL LOW (ref 3.5–5.0)
Alkaline Phosphatase: 94 U/L (ref 38–126)
Anion gap: 5 (ref 5–15)
BILIRUBIN TOTAL: 1 mg/dL (ref 0.3–1.2)
BUN: 12 mg/dL (ref 6–20)
CHLORIDE: 102 mmol/L (ref 101–111)
CO2: 26 mmol/L (ref 22–32)
CREATININE: 0.93 mg/dL (ref 0.44–1.00)
Calcium: 8.4 mg/dL — ABNORMAL LOW (ref 8.9–10.3)
GFR calc Af Amer: >60 mL/min (ref >60)
Glucose, Bld: 242 mg/dL — ABNORMAL HIGH (ref 65–99)
Potassium: 3.6 mmol/L (ref 3.5–5.1)
Sodium: 133 mmol/L — ABNORMAL LOW (ref 135–145)
Total Protein: 5.9 g/dL — ABNORMAL LOW (ref 6.5–8.1)

## 2015-11-12 LAB — CBG MONITORING, ED: GLUCOSE-CAPILLARY: 275 mg/dL — AB (ref 65–99)

## 2015-11-12 LAB — TROPONIN I

## 2015-11-12 NOTE — ED Triage Notes (Signed)
Pt in by ems for sob, has cirrhosis and has had fluid drained from abd this week.  Pt states she feels sob due to the pressure from her swollen abd

## 2015-11-12 NOTE — ED Provider Notes (Signed)
[facility] DEPT Provider Note   CSN: [id number] Arrival date & time: 11/12/15  2200     History   Chief Complaint Chief Complaint  Patient presents with  . Shortness of Breath    HPI [name] is a 55 y.o. female.  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 Status post paracentesis on Tuesday. She is scheduled for a paracentesis on Friday. She feels better after oxygen was placed by EMS. Severity of symptoms is severe.      Past Medical History:  Diagnosis Date  . Anemia   . Anxiety   . Ascites   . Cirrhosis ([place])    non alcoholic  . Complication of anesthesia   . Depression   . Diabetes mellitus type 2, controlled, without complications ([place])   . Enteropathy 07/31/2015   Portal hypertensive enteropathy per capsule study, with stigmata of bleeding.  . Esophageal varices ([place])   . Gastric ulcer 07/15/2015   per EGD; no stigmata of bleeding  . GI bleed 07/17/2015  . Gram-negative bacteremia 12/08/2011  . IOXBDZHG(992.4)    "monthly" (11/07/2012)  . Heart murmur   . Hepatitis C    Hx: of Hep "C" it was eradicated  . History of hepatitis C - successfuly treated medically 07/14/2015  . Kidney stones   . Liver failure ([place])    "I'm on liver transplant list @ [facility]" (11/07/2012)  . PONV (postoperative nausea and vomiting)   . Portal vein thrombosis   . Renal insufficiency   . Right ureteral stone 12/07/2011  . 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 Type II diabetes mellitus ([place])    "not since gastric bypass" (11/07/2012)    Patient Active Problem List   Diagnosis Date Noted  . Abdominal pain 11/10/2015  . Alcoholic cirrhosis of liver with ascites ([place])   . Enteropathy 07/31/2015  . Anemia, blood loss   . Depression with anxiety 07/30/2015  . Esophageal varices in cirrhosis ([place]) 07/30/2015  . Gastritis determined by endoscopy 07/30/2015  . Hypotension 07/30/2015  .  Hyponatremia 07/29/2015  . Hypokalemia 07/29/2015  . GI bleeding 07/29/2015  . Gastric ulcer 07/29/2015  . Leukopenia 07/29/2015  . AKI (acute kidney injury) ([place]) 07/29/2015  . Diabetes mellitus type 2, controlled, without complications ([place]) 26/83/4196  . GI bleed 07/17/2015  . Palliative care encounter   . Goals of care, counseling/discussion   . DNR (do not resuscitate) discussion   . 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 Melena 07/14/2015  . Hepatic cirrhosis due to chronic hepatitis C infection ([place]) 07/14/2015  . History of hepatitis C - successfuly treated medically 07/14/2015  . Portal vein thrombosis 05/13/2014  . Other malaise and fatigue 08/05/2013  . Hypoxemia 08/05/2013  . Abdominal pain, right upper quadrant 05/29/2013  . SBP (spontaneous bacterial peritonitis) ([place]) 05/28/2013  . Distal radius fracture 10/25/2012  . Metatarsal bone fracture 10/25/2012  . Insomnia 09/25/2012  . Encephalopathy, hepatic ([place]) 06/07/2012  . Hyperglycemia 06/06/2012  . LUQ abdominal pain 06/05/2012  . Ascites 05/14/2012  . Gram-negative bacteremia 12/08/2011  . Pyelonephritis 12/07/2011  . Hydronephrosis of right kidney 12/07/2011  . Right ureteral stone 12/07/2011  . Thrombocytopenia ([place]) 12/07/2011  . Cirrhosis of liver ([place]) 05/02/2011  . Acute blood loss anemia 05/02/2011    Past Surgical History:  Procedure Laterality Date  . ABDOMINAL HYSTERECTOMY  2003  . CESAREAN SECTION  1995  . CHOLECYSTECTOMY  1987  . COLONOSCOPY    .  COLONOSCOPY  10/21/2011   Procedure: COLONOSCOPY;  Surgeon: Rogene Houston, MD;  Location[facility];  Service: Endoscopy;  Laterality: N/A;  245   . CYSTOSCOPY W/ RETROGRADES  12/08/2011   Procedure: CYSTOSCOPY WITH RETROGRADE PYELOGRAM;  Surgeon: Marissa Nestle, MD;  Location: [facility];  Service: Urology;  Laterality: Right;  . CYSTOSCOPY WITH STENT PLACEMENT  12/08/2011   Procedure: CYSTOSCOPY WITH STENT PLACEMENT;  Surgeon:  Marissa Nestle, MD;  Location: [facility];  Service: Urology;  Laterality: Right;  . DILATION AND CURETTAGE OF UTERUS    . ESOPHAGOGASTRODUODENOSCOPY N/A 02/19/2014   Procedure: ESOPHAGOGASTRODUODENOSCOPY (EGD);  Surgeon: Rogene Houston, MD;  [facility];  Service: Endoscopy;  Laterality: N/A;  100  . ESOPHAGOGASTRODUODENOSCOPY N/A 07/15/2015   Procedure: ESOPHAGOGASTRODUODENOSCOPY (EGD);  Surgeon: Rogene Houston, MD;  [facility];  Service: Endoscopy;  Laterality: N/A;  . ESOPHAGOGASTRODUODENOSCOPY N/A 07/29/2015   Procedure: ESOPHAGOGASTRODUODENOSCOPY (EGD);  Surgeon: Rogene Houston, MD;  [facility];  Service: Endoscopy;  Laterality: N/A;  . GASTRIC BYPASS  ~ 1995  . HERNIA REPAIR  04/14/11   "one in my bellybutton" (11/07/2012)  . INGUINAL HERNIA REPAIR Right    "maybe 2" (11/07/2012)  . KNEE ARTHROSCOPY Right   . LITHOTRIPSY     "several times" (11/07/2012)  . OPEN REDUCTION INTERNAL FIXATION (ORIF) DISTAL RADIAL FRACTURE Right 11/07/2012   Procedure: OPEN REDUCTION INTERNAL FIXATION (ORIF) RIGHT DISTAL RADIAL FRACTURE;  Surgeon: [name], MD;  Location: [facility];  Service: Orthopedics;  Laterality: Right;  . ORIF DISTAL RADIUS FRACTURE Right 11/07/2012  . UPPER GASTROINTESTINAL ENDOSCOPY      OB History    Gravida Para Term Preterm AB Living   1 1 1          SAB TAB Ectopic Multiple Live Births                   Home Medications    Prior to Admission medications   Medication Sig Start Date End Date Taking? Authorizing Provider  b complex vitamins tablet Take 1 tablet by mouth daily. Reported on 08/18/2015   Yes Historical Provider, MD  ferrous sulfate 325 (65 FE) MG tablet Take 325 mg by mouth 2 (two) times daily with a meal. Reported on 08/18/2015   Yes Historical Provider, MD  furosemide (LASIX) 40 MG tablet Take 1 tablet (40 mg total) by mouth 3 (three) times a week. Patient taking differently: Take 100 mg by mouth daily.  08/19/15  Yes  Rogene Houston, MD  lactulose (CHRONULAC) 10 GM/15ML solution Take 45 mLs (30 g total) by mouth 3 (three) times daily. Patient taking differently: Take 30 g by mouth daily.  09/24/15  Yes Rogene Houston, MD  LANTUS SOLOSTAR 100 UNIT/ML Solostar Pen Inject 20 Units into the skin every evening. Reported on 08/18/2015 08/27/14  Yes Historical Provider, MD  Multiple Vitamin (MULTIVITAMIN WITH MINERALS) TABS tablet Take 1 tablet by mouth daily. Reported on 08/18/2015   Yes Historical Provider, MD  pantoprazole (PROTONIX) 40 MG tablet Take 1 tablet (40 mg total) by mouth 2 (two) times daily before a meal. 09/24/15  Yes Rogene Houston, MD  rifaximin (XIFAXAN) 550 MG TABS tablet Take 1 tablet (550 mg total) by mouth 2 (two) times daily. 09/24/15  Yes Rogene Houston, MD  spironolactone (ALDACTONE) 100 MG tablet Take 300 mg by mouth daily.   Yes Historical Provider, MD  traZODone (DESYREL) 50 MG  tablet Take 1 tablet (50 mg total) by mouth at bedtime. 11/11/15  Yes Thurnell Lose, MD  levofloxacin (LEVAQUIN) 500 MG tablet TAKE (1) TABLET BY MOUTH ONCE DAILY. Patient not taking: Reported on 11/10/2015 09/24/15   Rogene Houston, MD    Family History Family History  Problem Relation Age of Onset  . Dementia Mother   . Heart disease Father   . Liver disease Father   . Hypertension Father   . Diabetes Father   . Dementia Father   . Hypertension Brother   . Other Son     Cervical dystonia    Social History Social History  Substance Use Topics  . Smoking status: Former Smoker    Packs/day: 0.50    Years: 5.00    Types: Cigarettes    Quit date: 05/02/1991  . Smokeless tobacco: Never Used  . Alcohol use No     Allergies   Ancef [cefazolin sodium]; Gadobenate; Iodinated diagnostic agents; and Tape   Review of Systems Review of Systems  All other systems reviewed and are negative.    Physical Exam Updated Vital Signs BP 96/77   Pulse 81   Temp 98.3 F (36.8 C) (Oral)   Resp 17   Ht 5'  6" (1.676 m)   Wt 145 lb (65.8 kg)   SpO2 97%   BMI 23.40 kg/m   Physical Exam  Constitutional: She is oriented to person, place, and time.  Thin, ill-appearing  HENT:  Head: Normocephalic and atraumatic.  Eyes: Conjunctivae are normal.  Neck: Neck supple.  Cardiovascular: Normal rate and regular rhythm.   Pulmonary/Chest: Effort normal and breath sounds normal.  Abdominal:  Significant ascites  Musculoskeletal: Normal range of motion.  Neurological: She is alert and oriented to person, place, and time.  Skin: Skin is warm and dry.  Psychiatric: She has a normal mood and affect. Her behavior is normal.  Nursing note and vitals reviewed.    ED Treatments / Results  Labs (all labs ordered are listed, but only abnormal results are displayed) Labs Reviewed  CBC WITH DIFFERENTIAL/PLATELET - Abnormal; Notable for the following:       Result Value   WBC 3.5 (*)    RBC 3.34 (*)    Hemoglobin 8.9 (*)    HCT 27.7 (*)    RDW 16.7 (*)    Platelets 60 (*)    Lymphs Abs 0.4 (*)    All other components within normal limits  COMPREHENSIVE METABOLIC PANEL - Abnormal; Notable for the following:    Sodium 133 (*)    Glucose, Bld 242 (*)    Calcium 8.4 (*)    Total Protein 5.9 (*)    Albumin 3.1 (*)    All other components within normal limits  CBG MONITORING, ED - Abnormal; Notable for the following:    Glucose-Capillary 275 (*)    All other components within normal limits  TROPONIN I    EKG  EKG Interpretation None       Radiology US Abdomen Limited  Result Date: 11/11/2015 CLINICAL DATA:  Ascites, 1 day post paracentesis EXAM: LIMITED ABDOMINAL ULTRASOUND COMPARISON:  11/10/2015 FINDINGS: Small amounts of ascites are seen in BILATERAL lower quadrants and in RIGHT upper quadrant. A slightly larger collection of ascites is seen in the LEFT upper quadrant. IMPRESSION: Mild ascites. Electronically Signed   By: Lavonia Dana M.D.   On: 11/11/2015 11:33   Dg Chest Port 1  View  Result Date: 11/12/2015 CLINICAL DATA:  Shortness of breath, discharged from hospital yesterday for similar symptoms. Status post paracentesis this week. History of hepatitis-C and cirrhosis. EXAM: PORTABLE CHEST 1 VIEW COMPARISON:  Chest radiograph November 09, 2015 FINDINGS: The heart size and mediastinal contours are within normal limits. Both lungs are clear. The visualized skeletal structures are unremarkable. IMPRESSION: No active disease. Electronically Signed   By: Elon Alas M.D.   On: 11/12/2015 22:47    Procedures Procedures (including critical care time)  Medications Ordered in ED Medications - No data to display   Initial Impression / Assessment and Plan / ED Course  I have reviewed the triage vital signs and the nursing notes.  Pertinent labs & imaging results that were available during my care of the patient were reviewed by me and considered in my medical decision making (see chart for details).  Clinical Course    Patient has end-stage liver disease and is uncomfortable with ascites. Chest x-ray negative. Admit for paracentesis in the morning.  Final Clinical Impressions(s) / ED Diagnoses   Final diagnoses:  Other ascites  Cirrhosis of liver with ascites, unspecified hepatic cirrhosis type [facility])    New Prescriptions New Prescriptions   No medications on file     Nat Christen, MD 11/13/15 0013

## 2015-11-13 ENCOUNTER — Ambulatory Visit (HOSPITAL_COMMUNITY)
Admission: RE | Admit: 2015-11-13 | Discharge: 2015-11-13 | Disposition: A | Discharge status: Home / Self Care | Payer: Medicare Other | Source: Ambulatory Visit | Attending: Internal Medicine | Admitting: Internal Medicine

## 2015-11-13 ENCOUNTER — Observation Stay (HOSPITAL_COMMUNITY): Payer: Medicare Other

## 2015-11-13 ENCOUNTER — Encounter (HOSPITAL_COMMUNITY): Payer: Self-pay | Admitting: *Deleted

## 2015-11-13 ENCOUNTER — Ambulatory Visit (HOSPITAL_COMMUNITY): Admission: RE | Admit: 2015-11-13 | Payer: Medicare Other | Source: Ambulatory Visit

## 2015-11-13 DIAGNOSIS — K7460 Unspecified cirrhosis of liver: Secondary | ICD-10-CM

## 2015-11-13 DIAGNOSIS — D61818 Other pancytopenia: Secondary | ICD-10-CM | POA: Diagnosis present

## 2015-11-13 DIAGNOSIS — Z794 Long term (current) use of insulin: Secondary | ICD-10-CM

## 2015-11-13 DIAGNOSIS — E119 Type 2 diabetes mellitus without complications: Secondary | ICD-10-CM

## 2015-11-13 DIAGNOSIS — R188 Other ascites: Secondary | ICD-10-CM

## 2015-11-13 DIAGNOSIS — Z8619 Personal history of other infectious and parasitic diseases: Secondary | ICD-10-CM

## 2015-11-13 LAB — CBC
HEMATOCRIT: 25.2 % — AB (ref 36.0–46.0)
HEMOGLOBIN: 8.5 g/dL — AB (ref 12.0–15.0)
MCH: 28.1 pg (ref 26.0–34.0)
MCHC: 33.7 g/dL (ref 30.0–36.0)
MCV: 83.4 fL (ref 78.0–100.0)
Platelets: 53 10*3/uL — ABNORMAL LOW (ref 150–400)
RBC: 3.02 MIL/uL — AB (ref 3.87–5.11)
RDW: 16.7 % — ABNORMAL HIGH (ref 11.5–15.5)
WBC: 3 10*3/uL — ABNORMAL LOW (ref 4.0–10.5)

## 2015-11-13 LAB — COMPREHENSIVE METABOLIC PANEL
ALK PHOS: 79 U/L (ref 38–126)
ALT: 29 U/L (ref 14–54)
ANION GAP: 3 — AB (ref 5–15)
AST: 26 U/L (ref 15–41)
Albumin: 2.9 g/dL — ABNORMAL LOW (ref 3.5–5.0)
BILIRUBIN TOTAL: 0.9 mg/dL (ref 0.3–1.2)
BUN: 12 mg/dL (ref 6–20)
CALCIUM: 8 mg/dL — AB (ref 8.9–10.3)
CO2: 27 mmol/L (ref 22–32)
CREATININE: 0.77 mg/dL (ref 0.44–1.00)
Chloride: 102 mmol/L (ref 101–111)
Glucose, Bld: 159 mg/dL — ABNORMAL HIGH (ref 65–99)
Potassium: 3.7 mmol/L (ref 3.5–5.1)
SODIUM: 132 mmol/L — AB (ref 135–145)
TOTAL PROTEIN: 5.4 g/dL — AB (ref 6.5–8.1)

## 2015-11-13 LAB — GLUCOSE, CAPILLARY
GLUCOSE-CAPILLARY: 163 mg/dL — AB (ref 65–99)
GLUCOSE-CAPILLARY: 164 mg/dL — AB (ref 65–99)
Glucose-Capillary: 300 mg/dL — ABNORMAL HIGH (ref 65–99)
Glucose-Capillary: 257 mg/dL — ABNORMAL HIGH (ref 65–99)

## 2015-11-13 LAB — PROTIME-INR
INR: 1.6
PROTHROMBIN TIME: 19.2 s — AB (ref 11.4–15.2)

## 2015-11-13 MED ORDER — ONDANSETRON HCL 4 MG/2ML IJ SOLN
4.0000 mg | Freq: Four times a day (QID) | INTRAMUSCULAR | Status: DC | PRN
  Administered 2015-11-13 – 2015-11-14 (×3): 4 mg via INTRAVENOUS
  Filled 2015-11-13 (×3): qty 2

## 2015-11-13 MED ORDER — INSULIN GLARGINE 100 UNIT/ML ~~LOC~~ SOLN
20.0000 [IU] | Freq: Every evening | SUBCUTANEOUS | Status: DC
  Administered 2015-11-13: 20 [IU] via SUBCUTANEOUS
  Filled 2015-11-13 (×3): qty 0.2

## 2015-11-13 MED ORDER — OXYCODONE HCL 5 MG PO TABS
5.0000 mg | ORAL_TABLET | Freq: Four times a day (QID) | ORAL | Status: DC | PRN
  Administered 2015-11-13: 5 mg via ORAL
  Filled 2015-11-13: qty 1

## 2015-11-13 MED ORDER — ALBUMIN HUMAN 25 % IV SOLN
50.0000 g | Freq: Once | INTRAVENOUS | Status: AC
  Administered 2015-11-14: 50 g via INTRAVENOUS
  Filled 2015-11-13: qty 200

## 2015-11-13 MED ORDER — HYDROMORPHONE HCL 1 MG/ML IJ SOLN
1.0000 mg | INTRAMUSCULAR | Status: AC | PRN
  Administered 2015-11-13 (×3): 1 mg via INTRAVENOUS
  Filled 2015-11-13 (×3): qty 1

## 2015-11-13 MED ORDER — FERROUS SULFATE 325 (65 FE) MG PO TABS
325.0000 mg | ORAL_TABLET | Freq: Two times a day (BID) | ORAL | Status: DC
  Administered 2015-11-13 – 2015-11-14 (×3): 325 mg via ORAL
  Filled 2015-11-13 (×3): qty 1

## 2015-11-13 MED ORDER — OXYCODONE-ACETAMINOPHEN 5-325 MG PO TABS
1.0000 | ORAL_TABLET | Freq: Four times a day (QID) | ORAL | Status: DC | PRN
  Filled 2015-11-13: qty 1

## 2015-11-13 MED ORDER — TRAZODONE HCL 50 MG PO TABS
50.0000 mg | ORAL_TABLET | Freq: Every day | ORAL | Status: DC
  Administered 2015-11-13 (×2): 50 mg via ORAL
  Filled 2015-11-13 (×2): qty 1

## 2015-11-13 MED ORDER — FUROSEMIDE 80 MG PO TABS
100.0000 mg | ORAL_TABLET | Freq: Every day | ORAL | Status: DC
  Administered 2015-11-13 – 2015-11-14 (×2): 100 mg via ORAL
  Filled 2015-11-13 (×2): qty 1

## 2015-11-13 MED ORDER — SODIUM CHLORIDE 0.9% FLUSH
3.0000 mL | Freq: Two times a day (BID) | INTRAVENOUS | Status: DC
  Administered 2015-11-13 – 2015-11-14 (×3): 3 mL via INTRAVENOUS

## 2015-11-13 MED ORDER — ALBUMIN HUMAN 25 % IV SOLN
INTRAVENOUS | Status: AC
  Administered 2015-11-13: 14:00:00
  Filled 2015-11-13: qty 200

## 2015-11-13 MED ORDER — LACTULOSE 10 GM/15ML PO SOLN
30.0000 g | Freq: Every day | ORAL | Status: DC
  Administered 2015-11-13 – 2015-11-14 (×2): 30 g via ORAL
  Filled 2015-11-13 (×2): qty 60

## 2015-11-13 MED ORDER — SPIRONOLACTONE 100 MG PO TABS
300.0000 mg | ORAL_TABLET | Freq: Every day | ORAL | Status: DC
  Administered 2015-11-13 – 2015-11-14 (×2): 300 mg via ORAL
  Filled 2015-11-13 (×2): qty 3

## 2015-11-13 MED ORDER — SODIUM CHLORIDE 0.9 % IJ SOLN
INTRAMUSCULAR | Status: AC
  Filled 2015-11-13: qty 40

## 2015-11-13 MED ORDER — B COMPLEX-C PO TABS
1.0000 | ORAL_TABLET | Freq: Every day | ORAL | Status: DC
  Administered 2015-11-13 – 2015-11-14 (×2): 1 via ORAL
  Filled 2015-11-13 (×4): qty 1

## 2015-11-13 MED ORDER — MORPHINE SULFATE (CONCENTRATE) 10 MG/0.5ML PO SOLN
10.0000 mg | ORAL | Status: DC | PRN
  Administered 2015-11-13 – 2015-11-14 (×2): 10 mg via ORAL
  Filled 2015-11-13 (×2): qty 0.5

## 2015-11-13 MED ORDER — INSULIN ASPART 100 UNIT/ML ~~LOC~~ SOLN
0.0000 [IU] | Freq: Three times a day (TID) | SUBCUTANEOUS | Status: DC
  Administered 2015-11-13: 5 [IU] via SUBCUTANEOUS
  Administered 2015-11-13: 2 [IU] via SUBCUTANEOUS
  Administered 2015-11-13 – 2015-11-14 (×2): 5 [IU] via SUBCUTANEOUS
  Administered 2015-11-14: 1 [IU] via SUBCUTANEOUS

## 2015-11-13 MED ORDER — SODIUM CHLORIDE 0.9% FLUSH
INTRAVENOUS | Status: AC
  Filled 2015-11-13: qty 40

## 2015-11-13 MED ORDER — ALBUMIN HUMAN 25 % IV SOLN
50.0000 g | Freq: Once | INTRAVENOUS | Status: AC
  Administered 2015-11-13: 50 g via INTRAVENOUS

## 2015-11-13 MED ORDER — RIFAXIMIN 550 MG PO TABS
550.0000 mg | ORAL_TABLET | Freq: Two times a day (BID) | ORAL | Status: DC
  Administered 2015-11-13 – 2015-11-14 (×3): 550 mg via ORAL
  Filled 2015-11-13 (×3): qty 1

## 2015-11-13 MED ORDER — ADULT MULTIVITAMIN W/MINERALS CH
1.0000 | ORAL_TABLET | Freq: Every day | ORAL | Status: DC
  Administered 2015-11-13 – 2015-11-14 (×2): 1 via ORAL
  Filled 2015-11-13 (×2): qty 1

## 2015-11-13 MED ORDER — PANTOPRAZOLE SODIUM 40 MG PO TBEC
40.0000 mg | DELAYED_RELEASE_TABLET | Freq: Two times a day (BID) | ORAL | Status: DC
  Administered 2015-11-13 – 2015-11-14 (×3): 40 mg via ORAL
  Filled 2015-11-13 (×3): qty 1

## 2015-11-13 NOTE — Care Management Note (Signed)
Case Management Note  Patient Details  Name: [name] MRN: [id number] Date of Birth: 18-Sep-1960  Subjective/Objective:   Patient adm from home, lives with family and is ind with ADL's. She reports having a cane and walker at home if needed.  She states that she no longer is active with HH, that she told them to stop coming after one visit and became active with Hospice.  She states that she also has terminated with hospice services. Offered patient option to resume, she is refusing both at this time and states she would like to talk with husband. Her PCP is Dr. Gerarda Fraction and she has [employer]. She reports no issues getting to appointments or affording medications. She is scheduled today for a paracentesis. She would like to see if getting oxygen is an option for her. She has had oxygen previously with [facility] and would like to use them again. I have contacted [facility] and sent over demographics and [facility]. It is unlikley patient will qualify for oxygen at this time as she does not have a qualifying diagnosis per [facility]. After paracentesis, pt. Will need a qualifying o2 assessment. Patient made aware.                    Action/Plan: Anticipate DC home with self care. Will follow.   Expected Discharge Date:       11/13/2015           Expected Discharge Plan:     In-House Referral:     Discharge planning Services     Post Acute Care Choice:    Choice offered to:     DME Arranged:    DME Agency:     HH Arranged:    HH Agency:     Status of Service:     If discussed at [employer], dates discussed:    Additional Comments:  Dohn Stclair, Chauncey Reading, RN 11/13/2015, 11:03 AM

## 2015-11-13 NOTE — Care Management Obs Status (Signed)
[facility] NOTIFICATION   Patient Details  Name: AYANAH SNADER MRN: [id number] Date of Birth: 1960/08/02   Medicare Observation Status Notification Given:  Yes (patient notified but refused to sign)    Ilianna Bown, Chauncey Reading, RN 11/13/2015, 10:56 AM

## 2015-11-13 NOTE — Progress Notes (Signed)
Pt had percocet for PRN medication. Pt stated she cannot take any meds with acetaminophen.  MD notified and paged. Verbal order to put is oxy 5 mg. Oswald Hillock, RN

## 2015-11-13 NOTE — Sedation Documentation (Signed)
4658ml of dark yellow fluid drained. Tolerated without difficulty.

## 2015-11-13 NOTE — H&P (Signed)
History and Physical    TAWAN CORKERN KGU:[id number] DOB: September 10, 1960 DOA: 11/12/2015  PCP: Glo Herring., MD   Patient coming from: Home.  Chief Complaint: Shortness of breath.  HPI: [name] is a 55 y.o. female with medical history significant of anemia, anxiety, depression, type 2 diabetes, due the, esophageal varices, superior mesenteric vein thrombosis (no longer anticoagulated due to recurrent GI bleed), urolithiasis, nonalcoholic, hep C related liver cirrhosis who was admitted 3 days ago, discharged yesterday and scheduled for a paracentesis with IR this morning at 0800 due to ascites.   Per patient, she became progressively more dyspneic due to worsening of her ascites today. She stated that she has been very careful with her sodium consumption and fluid intake. She denies fever, chills, but feels tired and fatigued.  ED Course: Oxygen by EMS and they ED significantly improve her dyspnea. Workup shows WBC of 3.5, Hb level of 8.9 gr/dL, platelets of 60. CMP shows normal LFTs, sodium of 133 mmol/L, glucose of 242 mg/dL, albumin level of 3.1 g/dL and a chest radiograph without active cardiopulmonary disease.  Review of Systems: As per HPI otherwise 10 point review of systems negative.    Past Medical History:  Diagnosis Date  . Anemia   . Anxiety   . Ascites   . Cirrhosis ([place])    non alcoholic  . Complication of anesthesia   . Depression   . Diabetes mellitus type 2, controlled, without complications ([place])   . Enteropathy 07/31/2015   Portal hypertensive enteropathy per capsule study, with stigmata of bleeding.  . Esophageal varices ([place])   . Gastric ulcer 07/15/2015   per EGD; no stigmata of bleeding  . GI bleed 07/17/2015  . Gram-negative bacteremia 12/08/2011  . SEGBTDVV(616.0)    "monthly" (11/07/2012)  . Heart murmur   . Hepatitis C    Hx: of Hep "C" it was eradicated  . History of hepatitis C - successfuly treated medically 07/14/2015  . Kidney stones   . Liver  failure ([place])    "I'm on liver transplant list @ [facility]" (11/07/2012)  . PONV (postoperative nausea and vomiting)   . Portal vein thrombosis   . Renal insufficiency   . Right ureteral stone 12/07/2011  . 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 Type II diabetes mellitus ([place])    "not since gastric bypass" (11/07/2012)    Past Surgical History:  Procedure Laterality Date  . ABDOMINAL HYSTERECTOMY  2003  . CESAREAN SECTION  1995  . CHOLECYSTECTOMY  1987  . COLONOSCOPY    . COLONOSCOPY  10/21/2011   Procedure: COLONOSCOPY;  Surgeon: Rogene Houston, MD;  [facility];  Service: Endoscopy;  Laterality: N/A;  245   . CYSTOSCOPY W/ RETROGRADES  12/08/2011   Procedure: CYSTOSCOPY WITH RETROGRADE PYELOGRAM;  Surgeon: Marissa Nestle, MD;  Location: [facility];  Service: Urology;  Laterality: Right;  . CYSTOSCOPY WITH STENT PLACEMENT  12/08/2011   Procedure: CYSTOSCOPY WITH STENT PLACEMENT;  Surgeon: Marissa Nestle, MD;  Location: [facility];  Service: Urology;  Laterality: Right;  . DILATION AND CURETTAGE OF UTERUS    . ESOPHAGOGASTRODUODENOSCOPY N/A 02/19/2014   Procedure: ESOPHAGOGASTRODUODENOSCOPY (EGD);  Surgeon: Rogene Houston, MD;  [facility];  Service: Endoscopy;  Laterality: N/A;  100  . ESOPHAGOGASTRODUODENOSCOPY N/A 07/15/2015   Procedure: ESOPHAGOGASTRODUODENOSCOPY (EGD);  Surgeon: Rogene Houston, MD;  [facility];  Service: Endoscopy;  Laterality: N/A;  . ESOPHAGOGASTRODUODENOSCOPY N/A 07/29/2015   Procedure:  ESOPHAGOGASTRODUODENOSCOPY (EGD);  Surgeon: Rogene Houston, MD;  [facility];  Service: Endoscopy;  Laterality: N/A;  . GASTRIC BYPASS  ~ 1995  . HERNIA REPAIR  04/14/11   "one in my bellybutton" (11/07/2012)  . INGUINAL HERNIA REPAIR Right    "maybe 2" (11/07/2012)  . KNEE ARTHROSCOPY Right   . LITHOTRIPSY     "several times" (11/07/2012)  . OPEN REDUCTION INTERNAL FIXATION (ORIF) DISTAL RADIAL FRACTURE Right 11/07/2012    Procedure: OPEN REDUCTION INTERNAL FIXATION (ORIF) RIGHT DISTAL RADIAL FRACTURE;  Surgeon: [name], MD;  Location: [facility];  Service: Orthopedics;  Laterality: Right;  . ORIF DISTAL RADIUS FRACTURE Right 11/07/2012  . UPPER GASTROINTESTINAL ENDOSCOPY       reports that she quit smoking about 24 years ago. Her smoking use included Cigarettes. She has a 2.50 pack-year smoking history. She has never used smokeless tobacco. She reports that she does not drink alcohol or use drugs.  Allergies  Allergen Reactions  . Ancef [Cefazolin Sodium] Other (See Comments)    "Blisters in mouth and vagina" Tolerates Penicillins  . Gadobenate Itching    Nausea and vomiting reported by patient post injection of multihance.  Pt states itching and throat redness after receiving MRI contrast.  . Iodinated Diagnostic Agents Rash    Patient reports itching, nausea and vomiting.  . Tape Rash    Blisters skin.    Family History  Problem Relation Age of Onset  . Dementia Mother   . Heart disease Father   . Liver disease Father   . Hypertension Father   . Diabetes Father   . Dementia Father   . Hypertension Brother   . Other Son     Cervical dystonia   Prior to Admission medications   Medication Sig Start Date End Date Taking? Authorizing Provider  b complex vitamins tablet Take 1 tablet by mouth daily. Reported on 08/18/2015   Yes Historical Provider, MD  ferrous sulfate 325 (65 FE) MG tablet Take 325 mg by mouth 2 (two) times daily with a meal. Reported on 08/18/2015   Yes Historical Provider, MD  furosemide (LASIX) 40 MG tablet Take 1 tablet (40 mg total) by mouth 3 (three) times a week. Patient taking differently: Take 100 mg by mouth daily.  08/19/15  Yes Rogene Houston, MD  lactulose (CHRONULAC) 10 GM/15ML solution Take 45 mLs (30 g total) by mouth 3 (three) times daily. Patient taking differently: Take 30 g by mouth daily.  09/24/15  Yes Rogene Houston, MD  LANTUS SOLOSTAR 100 UNIT/ML Solostar  Pen Inject 20 Units into the skin every evening. Reported on 08/18/2015 08/27/14  Yes Historical Provider, MD  Multiple Vitamin (MULTIVITAMIN WITH MINERALS) TABS tablet Take 1 tablet by mouth daily. Reported on 08/18/2015   Yes Historical Provider, MD  pantoprazole (PROTONIX) 40 MG tablet Take 1 tablet (40 mg total) by mouth 2 (two) times daily before a meal. 09/24/15  Yes Rogene Houston, MD  rifaximin (XIFAXAN) 550 MG TABS tablet Take 1 tablet (550 mg total) by mouth 2 (two) times daily. 09/24/15  Yes Rogene Houston, MD  spironolactone (ALDACTONE) 100 MG tablet Take 300 mg by mouth daily.   Yes Historical Provider, MD  traZODone (DESYREL) 50 MG tablet Take 1 tablet (50 mg total) by mouth at bedtime. 11/11/15  Yes Thurnell Lose, MD  levofloxacin (LEVAQUIN) 500 MG tablet TAKE (1) TABLET BY MOUTH ONCE DAILY. Patient not taking: Reported on 11/10/2015 09/24/15  Rogene Houston, MD    Physical Exam:  Constitutional: NAD, calm, comfortable Vitals:   11/12/15 2215 11/12/15 2230 11/13/15 0024 11/13/15 0156  BP:  96/77 123/76 112/62  Pulse:  81 78 74  Resp:  17 18 18   Temp:    98.3 F (36.8 C)  TempSrc:    Oral  SpO2:  97% 100% 100%  Weight: 65.8 kg (145 lb)   70.6 kg (155 lb 9.6 oz)  Height: 5\' 6"  (1.676 m)   5\' 6"  (1.676 m)   Eyes: PERRL, lids and conjunctivae normal ENMT: Mucous membranes are moist. Posterior pharynx clear of any exudate or lesions. Neck: normal, supple, no masses, no thyromegaly Respiratory: Decreased breath sounds at bases, no wheezing, no crackles. Normal respiratory effort. No accessory muscle use.  Cardiovascular: Regular rate and rhythm, no murmurs / rubs / gallops. No extremity edema. 2+ pedal pulses. No carotid bruits.  Abdomen: Distended, ascites, caput medusae, ecchymoses, diffusely tender without guarding or rebound. Musculoskeletal: no clubbing / cyanosis. No joint deformity upper and lower extremities. Good ROM, no contractures. Normal muscle tone.  Skin:  Positive ecchymoses on abdomen/extremities, particularly significant on the left forearm. Neurologic: CN 2-12 grossly intact. Sensation intact, DTR normal. Strength 5/5 in all 4.  Psychiatric: Normal judgment and insight. Alert and oriented x 4. Normal mood.    Labs on Admission: I have personally reviewed following labs and imaging studies  CBC:  Recent Labs Lab 11/09/15 2322 11/11/15 0646 11/12/15 2232  WBC 4.9 3.2* 3.5*  NEUTROABS 3.8 2.3 2.6  HGB 9.2* 8.1* 8.9*  HCT 28.2* 24.8* 27.7*  MCV 81.0 82.1 82.9  PLT 64* 52* 60*   Basic Metabolic Panel:  Recent Labs Lab 11/09/15 2322 11/11/15 0646 11/12/15 2232  NA 131* 128* 133*  K 3.2* 3.4* 3.6  CL 96* 95* 102  CO2 29 28 26   GLUCOSE 209* 254* 242*  BUN 14 10 12   CREATININE 0.88 0.90 0.93  CALCIUM 8.2* 8.0* 8.4*  MG 1.7  --   --    GFR: Estimated Creatinine Clearance: 64.7 mL/min (by C-G formula based on SCr of 0.93 mg/dL). Liver Function Tests:  Recent Labs Lab 11/09/15 2322 11/11/15 0646 11/12/15 2232  AST 34 48* 28  ALT 26 38 32  ALKPHOS 79 99 94  BILITOT 1.1 1.3* 1.0  PROT 6.1* 5.7* 5.9*  ALBUMIN 2.8* 3.1* 3.1*    Recent Labs Lab 11/09/15 2322  LIPASE 22    Recent Labs Lab 11/09/15 2322  AMMONIA 107*   Coagulation Profile:  Recent Labs Lab 11/09/15 2322  INR 1.46   Cardiac Enzymes:  Recent Labs Lab 11/09/15 2322 11/12/15 2232  TROPONINI <0.03 <0.03   BNP (last 3 results) No results for input(s): PROBNP in the last 8760 hours. HbA1C: No results for input(s): HGBA1C in the last 72 hours. CBG:  Recent Labs Lab 11/10/15 1609 11/10/15 2044 11/11/15 0734 11/11/15 1135 11/12/15 2210  GLUCAP 203* 106* 229* 162* 275*   Lipid Profile: No results for input(s): CHOL, HDL, LDLCALC, TRIG, CHOLHDL, LDLDIRECT in the last 72 hours. Thyroid Function Tests: No results for input(s): TSH, T4TOTAL, FREET4, T3FREE, THYROIDAB in the last 72 hours. Anemia Panel: No results for input(s):  VITAMINB12, FOLATE, FERRITIN, TIBC, IRON, RETICCTPCT in the last 72 hours. Urine analysis:    Component Value Date/Time   COLORURINE YELLOW 11/09/2015 2309   APPEARANCEUR CLEAR 11/09/2015 2309   LABSPEC 1.025 11/09/2015 2309   PHURINE 5.5 11/09/2015 2309   GLUCOSEU NEGATIVE 11/09/2015  [place] 11/09/2015 2309   BILIRUBINUR NEGATIVE 11/09/2015 2309   KETONESUR NEGATIVE 11/09/2015 2309   PROTEINUR NEGATIVE 11/09/2015 2309   UROBILINOGEN 0.2 05/28/2013 0206   NITRITE NEGATIVE 11/09/2015 2309   LEUKOCYTESUR TRACE (A) 11/09/2015 2309   Recent Results (from the past 240 hour(s))  Urine culture     Status: Abnormal   Collection Time: 11/09/15 11:09 PM  Result Value Ref Range Status   Specimen Description URINE, CLEAN CATCH  Final   Special Requests NONE  Final   Culture (A)  Final    <10,000 COLONIES/mL INSIGNIFICANT GROWTH Performed at [facility]    Report Status 11/11/2015 FINAL  Final  Gram stain     Status: None   Collection Time: 11/10/15 11:30 AM  Result Value Ref Range Status   Specimen Description FLUID ASCITIC COLLECTED BY DOCTOR  Final   Special Requests NONE  Final   Gram Stain   Final    CYTOSPIN SMEAR WBC PRESENT, PREDOMINANTLY MONONUCLEAR NO ORGANISMS SEEN Performed at [facility]    Report Status 11/10/2015 FINAL  Final  Culture, body fluid-bottle     Status: None (Preliminary result)   Collection Time: 11/10/15 11:30 AM  Result Value Ref Range Status   Specimen Description FLUID ASCITIC COLLECTED BY DOCTOR  Final   Special Requests BOTTLES DRAWN AEROBIC AND ANAEROBIC 10CC  Final   Culture NO GROWTH 2 DAYS  Final   Report Status PENDING  Incomplete     Radiological Exams on Admission: US Abdomen Limited  Result Date: 11/11/2015 CLINICAL DATA:  Ascites, 1 day post paracentesis EXAM: LIMITED ABDOMINAL ULTRASOUND COMPARISON:  11/10/2015 FINDINGS: Small amounts of ascites are seen in BILATERAL lower quadrants and in RIGHT upper  quadrant. A slightly larger collection of ascites is seen in the LEFT upper quadrant. IMPRESSION: Mild ascites. Electronically Signed   By: Lavonia Dana M.D.   On: 11/11/2015 11:33   Dg Chest Port 1 View  Result Date: 11/12/2015 CLINICAL DATA:  Shortness of breath, discharged from hospital yesterday for similar symptoms. Status post paracentesis this week. History of hepatitis-C and cirrhosis. EXAM: PORTABLE CHEST 1 VIEW COMPARISON:  Chest radiograph November 09, 2015 FINDINGS: The heart size and mediastinal contours are within normal limits. Both lungs are clear. The visualized skeletal structures are unremarkable. IMPRESSION: No active disease. Electronically Signed   By: Elon Alas M.D.   On: 11/12/2015 22:47    EKG: Independently reviewed. Vent. rate 86 BPM PR interval * ms QRS duration 91 ms QT/QTc 436/522 ms P-R-T axes 56 20 -13 Sinus rhythm Inferoposterior infarct, age indeterminate Anterolateral infarct, age indeterminate Prolonged QT interval  Assessment/Plan Principal Problem:   Ascites   Cirrhosis of liver (HCC)   History of hepatitis C - successfuly treated medically Observation/telemetry. Continue supplemental oxygen. Fluid restriction to 1200 ML daily. Intake and output. Daily weights. Analgesics as needed. The patient is scheduled to have a paracentesis by IR at 8 AM today. Continue furosemide 100 mg by mouth daily. Continue Aldactone 300 mg by mouth daily. Continue Xifaxan 550 mg by mouth twice a day. Continue lactulose 30 g by mouth daily.  Active Problems:   Diabetes mellitus type 2, controlled, without complications (HCC) Carbohydrate modified diet with fluid restriction. Continue Lantus 20 units SQ at bedtime. CBG monitoring with regular insulin sliding scale.      Pancytopenia ([place]) Secondary to liver cirrhosis due to hepatitis C. Monitor CBC.   DVT prophylaxis: SCDs. Code Status: Full code.  Family Communication:  Disposition Plan: Observation  for symptoms treatment and paracentesis by IR later today. Consults called: The patient is scheduled to see interventional radiology as 0800 today. Admission status: Observation/telemetry.   Reubin Milan MD [facility] Pager [phone].  If 7PM-7AM, please contact night-coverage [email or web address] Password [email or web address]  11/13/2015, 3:20 AM

## 2015-11-13 NOTE — Sedation Documentation (Signed)
Patient denies pain and is resting comfortably.  

## 2015-11-13 NOTE — Progress Notes (Signed)
Patient admitted to the hospital earlier this morning by Dr. Olevia Bowens  Patient seen and examined  She was readmitted to the hospital with worsening ascites and associated shortness of breath. CXR did not show acute findings. She underwent paracentesis today with removal of 3.5L of fluid. Breathing is improving. She still does not feel well. Discussed with Dr. Laural Golden who is familiar with her. He recommends continued observation overnight and for patient to receive albumin in the morning. If she is feeling better by tomorrow, can likely discharge home in the morning.  MEMON,JEHANZEB

## 2015-11-13 NOTE — Sedation Documentation (Signed)
Vital signs stable. 

## 2015-11-13 NOTE — Care Management (Addendum)
[name], [name] #[id number] (CSN: [id number]) (55 y.o. F) (Adm: 11/12/15)  [id number]   PCP  [employer]   Demographics   Comment    Last edited by  on at   Address: Home Phone: Work Futures trader:  [place] [address]   [phone] -- [phone]  SSN: Insurance: Marital Status: Religion:  [id number] BLUE [place] MEDICARE Divorced [employer]  Patient Ethnicity & Race   Ethnic Group Patient Race  Not Hispanic or Latino White or Caucasian  Documents Filed to Patient   Power of Attorney Living Will Clinical Unknown Study Attachment Consent Form ABN Waiver After Visit Summary Lab Result Scan Code Status MyChart Status Advance Care Planning  Not on File Not on File Not on File Not on File Filed Not on File Jani Files FULL [Updated on 11/13/15 0227] Active Jump to the Activity  Admission Information   Attending Provider Admitting Provider Admission Type Admission Date/Time  Kathie Dike, MD Reubin Milan, MD Emergency 11/12/15 2201  Discharge Date Hospital Service Auth/Cert Status Service Area   Internal Medicine Incomplete [place]  Unit Room/Bed Admission Status   AP-DEPT 300 A320/A320-01 Admission (Confirmed)   Hospital Account   Name Acct ID Class Status Primary Coverage  [name], [name] [id number] Observation Open BLUE CROSS BLUE [place]      Guarantor Account (for Hospital Account [id number])   Name Relation to Pt Service Area Active? Acct Type  [name] Self [facility] Yes Personal/Family  Address Phone    [address] [place], [place] [address] [phone])        Coverage Information (for Hospital Account [id number])   F/O Payor/Plan Precert #  [facility] SHIELD MEDICARE/BCBS MEDICARE   Subscriber Subscriber #  [name], [name] [id number]  Address Phone  [address] [place], [place] [address] [phone]   SSN # [id number]

## 2015-11-13 NOTE — Progress Notes (Signed)
Pt taken down to IR for paracentesis by Blinda Leatherwood. Oswald Hillock, RN

## 2015-11-13 NOTE — Procedures (Signed)
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

## 2015-11-14 DIAGNOSIS — D61818 Other pancytopenia: Secondary | ICD-10-CM | POA: Diagnosis not present

## 2015-11-14 DIAGNOSIS — E119 Type 2 diabetes mellitus without complications: Secondary | ICD-10-CM | POA: Diagnosis not present

## 2015-11-14 DIAGNOSIS — R188 Other ascites: Secondary | ICD-10-CM | POA: Diagnosis not present

## 2015-11-14 DIAGNOSIS — K7460 Unspecified cirrhosis of liver: Secondary | ICD-10-CM | POA: Diagnosis not present

## 2015-11-14 LAB — GLUCOSE, CAPILLARY
Glucose-Capillary: 131 mg/dL — ABNORMAL HIGH (ref 65–99)
Glucose-Capillary: 296 mg/dL — ABNORMAL HIGH (ref 65–99)

## 2015-11-14 MED ORDER — FUROSEMIDE 40 MG PO TABS: 100.0000 mg | ORAL_TABLET | Freq: Every day | ORAL | Status: DC

## 2015-11-14 NOTE — Discharge Summary (Signed)
Physician Discharge Summary  [name] YPP:[id number] DOB: [birth year] DOA: 11/12/2015  PCP: Glo Herring., MD  Admit date: 11/12/2015 Discharge date: 11/14/2015  Admitted From: home Disposition:  home  Recommendations for Outpatient Follow-up:  1. Follow up with Dr Laural Golden as previously scheduled 2. Follow up with pcp to discuss pain med regimen 3. Patient has standing order for paracentesis  Home Health: Equipment/Devices:  Discharge Condition: stable CODE STATUS: full Diet recommendation: Heart Healthy / Carb Modified   Brief/Interim Summary: 55 year old female with history of hep C cirrhosis, was recently discharged from the hospital after being treated for significant ascites. Upon returning home, she developed worsening ascites and associated shortness of breath. She came back to the emergency room for evaluation.  Discharge Diagnoses:  Principal Problem:   Ascites Active Problems:   Cirrhosis of liver (HCC)   History of hepatitis C - successfuly treated medically   Diabetes mellitus type 2, controlled, without complications (HCC)   Pancytopenia (HCC)   Cirrhosis of liver with ascites ([place])  Patient was seen by radiology and had repeat paracentesis done with removal of 3.5 L of fluid. Of note, on her last admission she also had 5 L of fluid removed. After paracentesis, the patient was feeling better. She did receive albumin infusion. She was continued on her regular medications. She appears to be back to her baseline level of functioning. The patient will be discharged home and will follow up with gastroenterology as scheduled. She has a standing order for paracentesis and can present as necessary. She feels that her pain is not adequately controlled on oxycodone and has been advised to follow-up with her primary care physician to discuss this further.  Discharge Instructions  Discharge Instructions    Diet - low sodium heart healthy    Complete by:  As directed     Increase activity slowly    Complete by:  As directed        Medication List    STOP taking these medications   levofloxacin 500 MG tablet Commonly known as:  LEVAQUIN     TAKE these medications   b complex vitamins tablet Take 1 tablet by mouth daily. Reported on 08/18/2015   ferrous sulfate 325 (65 FE) MG tablet Take 325 mg by mouth 2 (two) times daily with a meal. Reported on 08/18/2015   furosemide 40 MG tablet Commonly known as:  LASIX Take 2.5 tablets (100 mg total) by mouth daily.   lactulose 10 GM/15ML solution Commonly known as:  CHRONULAC Take 45 mLs (30 g total) by mouth 3 (three) times daily. What changed:  when to take this   LANTUS SOLOSTAR 100 UNIT/ML Solostar Pen Generic drug:  Insulin Glargine Inject 20 Units into the skin every evening. Reported on 08/18/2015   multivitamin with minerals Tabs tablet Take 1 tablet by mouth daily. Reported on 08/18/2015   pantoprazole 40 MG tablet Commonly known as:  PROTONIX Take 1 tablet (40 mg total) by mouth 2 (two) times daily before a meal.   rifaximin 550 MG Tabs tablet Commonly known as:  XIFAXAN Take 1 tablet (550 mg total) by mouth 2 (two) times daily.   spironolactone 100 MG tablet Commonly known as:  ALDACTONE Take 300 mg by mouth daily.   traZODone 50 MG tablet Commonly known as:  DESYREL Take 1 tablet (50 mg total) by mouth at bedtime.       Allergies  Allergen Reactions  . Ancef [Cefazolin Sodium] Other (See Comments)    "Blisters  in mouth and vagina" Tolerates Penicillins  . Gadobenate Itching    Nausea and vomiting reported by patient post injection of multihance.  Pt states itching and throat redness after receiving MRI contrast.  . Iodinated Diagnostic Agents Rash    Patient reports itching, nausea and vomiting.  . Tape Rash    Blisters skin.    Consultations:     Procedures/Studies: Dg Chest 2 View  Result Date: 11/10/2015 CLINICAL DATA:  Shortness of breath and abdominal  distention for 1 day. Last paracentesis in January. History of hepatitis-C and liver failure. EXAM: CHEST  2 VIEW COMPARISON:  Acute abdominal series July 14, 2015 FINDINGS: Cardiomediastinal silhouette is normal. No pleural effusions or focal consolidations. Linear density Trachea projects midline and there is no pneumothorax. Soft tissue planes and included osseous structures are non-suspicious. Surgical clips in the included right abdomen compatible with cholecystectomy. IMPRESSION: Lung base atelectasis. Electronically Signed   By: Elon Alas M.D.   On: 11/10/2015 00:32   US Abdomen Limited  Result Date: 11/11/2015 CLINICAL DATA:  Ascites, 1 day post paracentesis EXAM: LIMITED ABDOMINAL ULTRASOUND COMPARISON:  11/10/2015 FINDINGS: Small amounts of ascites are seen in BILATERAL lower quadrants and in RIGHT upper quadrant. A slightly larger collection of ascites is seen in the LEFT upper quadrant. IMPRESSION: Mild ascites. Electronically Signed   By: Lavonia Dana M.D.   On: 11/11/2015 11:33   US Paracentesis  Result Date: 11/13/2015 INDICATION: Cirrhosis, ascites EXAM: ULTRASOUND GUIDED THERAPEUTIC PARACENTESIS MEDICATIONS: None. COMPLICATIONS: None immediate. PROCEDURE: Procedure, benefits, and risks of procedure were discussed with patient. Written informed consent for procedure was obtained. Time out protocol followed. Adequate collection of ascites localized by ultrasound in LEFT lower quadrant. Skin prepped and draped in usual sterile fashion. Skin and soft tissues anesthetized with 10 mL of 1% lidocaine. 5 [place] Yueh catheter placed into peritoneal cavity. 3.4 L of yellow ascitic fluid aspirated by vacuum bottle suction. Procedure tolerated well by patient without immediate complication. FINDINGS: As above IMPRESSION: Successful ultrasound-guided paracentesis yielding 3.4 liters of peritoneal fluid. Electronically Signed   By: Lavonia Dana M.D.   On: 11/13/2015 14:33   US  Paracentesis  Result Date: 11/10/2015 INDICATION: Cirrhosis. EXAM: ULTRASOUND GUIDED LEFT ABDOMINAL PARACENTESIS MEDICATIONS: None. COMPLICATIONS: None immediate. PROCEDURE: Informed written consent was obtained from the patient after a discussion of the risks, benefits and alternatives to treatment. A timeout was performed prior to the initiation of the procedure. Initial ultrasound scanning demonstrates a large amount of ascites within the left abdominal space. The left lower abdomen was prepped and draped in the usual sterile fashion. 1% lidocaine with epinephrine was used for local anesthesia. Following this, a 19 gauge, 7-cm, Yueh catheter was introduced. An ultrasound image was saved for documentation purposes. The paracentesis was performed. The catheter was removed and a dressing was applied. The patient tolerated the procedure well without immediate post procedural complication. FINDINGS: A total of approximately 5,000 cc of serous fluid was removed. Samples were sent to the laboratory as requested by the clinical team. IMPRESSION: Successful ultrasound-guided paracentesis yielding 5.0 liters of peritoneal fluid. Electronically Signed   By: Abigail Miyamoto M.D.   On: 11/10/2015 11:40   Dg Chest Port 1 View  Result Date: 11/12/2015 CLINICAL DATA:  Shortness of breath, discharged from hospital yesterday for similar symptoms. Status post paracentesis this week. History of hepatitis-C and cirrhosis. EXAM: PORTABLE CHEST 1 VIEW COMPARISON:  Chest radiograph November 09, 2015 FINDINGS: The heart size and mediastinal contours are within  normal limits. Both lungs are clear. The visualized skeletal structures are unremarkable. IMPRESSION: No active disease. Electronically Signed   By: Elon Alas M.D.   On: 11/12/2015 22:47   Mm Screening Breast Tomo Bilateral  Result Date: 11/02/2015 CLINICAL DATA:  Screening. EXAM: 2D DIGITAL SCREENING BILATERAL MAMMOGRAM WITH CAD AND ADJUNCT TOMO COMPARISON:  Previous  exam(s). ACR Breast Density Category a: The breast tissue is almost entirely fatty. FINDINGS: There are no findings suspicious for malignancy. Images were processed with CAD. IMPRESSION: No mammographic evidence of malignancy. A result letter of this screening mammogram will be mailed directly to the patient. RECOMMENDATION: Screening mammogram in one year. (Code:SM-B-01Y) BI-RADS CATEGORY  1: Negative. Electronically Signed   By: Altamese Cabal M.D.   On: 11/02/2015 16:33    10/6 paracentesis with removal of 3.5L    Subjective: Feeling better this morning. Eating breakfast. No new complaints  Discharge Exam: Vitals:   11/13/15 2054 11/14/15 0641  BP: (!) 102/43 (!) 99/50  Pulse: 80 72  Resp: 18 18  Temp: 98.5 F (36.9 C) 98.2 F (36.8 C)   Vitals:   11/13/15 1355 11/13/15 1744 11/13/15 2054 11/14/15 0641  BP: (!) 114/56 (!) 104/54 (!) 102/43 (!) 99/50  Pulse: 75 87 80 72  Resp: 18 18 18 18   Temp:  97.9 F (36.6 C) 98.5 F (36.9 C) 98.2 F (36.8 C)  TempSrc:  Oral Oral Oral  SpO2: 99% 100% 99% 96%  Weight:      Height:        General: Pt is alert, awake, not in acute distress Cardiovascular: RRR, S1/S2 +, no rubs, no gallops Respiratory: CTA bilaterally, no wheezing, no rhonchi Abdominal: Soft, NT, distended, bowel sounds + Extremities: no edema, no cyanosis    The results of significant diagnostics from this hospitalization (including imaging, microbiology, ancillary and laboratory) are listed below for reference.     Microbiology: Recent Results (from the past 240 hour(s))  Urine culture     Status: Abnormal   Collection Time: 11/09/15 11:09 PM  Result Value Ref Range Status   Specimen Description URINE, CLEAN CATCH  Final   Special Requests NONE  Final   Culture (A)  Final    <10,000 COLONIES/mL INSIGNIFICANT GROWTH Performed at [facility]    Report Status 11/11/2015 FINAL  Final  Gram stain     Status: None   Collection Time: 11/10/15 11:30 AM   Result Value Ref Range Status   Specimen Description FLUID ASCITIC COLLECTED BY DOCTOR  Final   Special Requests NONE  Final   Gram Stain   Final    CYTOSPIN SMEAR WBC PRESENT, PREDOMINANTLY MONONUCLEAR NO ORGANISMS SEEN Performed at [facility]    Report Status 11/10/2015 FINAL  Final  Culture, body fluid-bottle     Status: None (Preliminary result)   Collection Time: 11/10/15 11:30 AM  Result Value Ref Range Status   Specimen Description FLUID ASCITIC COLLECTED BY DOCTOR  Final   Special Requests BOTTLES DRAWN AEROBIC AND ANAEROBIC 10CC  Final   Culture NO GROWTH 4 DAYS  Final   Report Status PENDING  Incomplete     Labs: BNP (last 3 results) No results for input(s): BNP in the last 8760 hours. Basic Metabolic Panel:  Recent Labs Lab 11/09/15 2322 11/11/15 0646 11/12/15 2232 11/13/15 0607  NA 131* 128* 133* 132*  K 3.2* 3.4* 3.6 3.7  CL 96* 95* 102 102  CO2 29 28 26 27   GLUCOSE 209* 254*  242* 159*  BUN 14 10 12 12   CREATININE 0.88 0.90 0.93 0.77  CALCIUM 8.2* 8.0* 8.4* 8.0*  MG 1.7  --   --   --    Liver Function Tests:  Recent Labs Lab 11/09/15 2322 11/11/15 0646 11/12/15 2232 11/13/15 0607  AST 34 48* 28 26  ALT 26 38 32 29  ALKPHOS 79 99 94 79  BILITOT 1.1 1.3* 1.0 0.9  PROT 6.1* 5.7* 5.9* 5.4*  ALBUMIN 2.8* 3.1* 3.1* 2.9*    Recent Labs Lab 11/09/15 2322  LIPASE 22    Recent Labs Lab 11/09/15 2322  AMMONIA 107*   CBC:  Recent Labs Lab 11/09/15 2322 11/11/15 0646 11/12/15 2232 11/13/15 0607  WBC 4.9 3.2* 3.5* 3.0*  NEUTROABS 3.8 2.3 2.6  --   HGB 9.2* 8.1* 8.9* 8.5*  HCT 28.2* 24.8* 27.7* 25.2*  MCV 81.0 82.1 82.9 83.4  PLT 64* 52* 60* 53*   Cardiac Enzymes:  Recent Labs Lab 11/09/15 2322 11/12/15 2232  TROPONINI <0.03 <0.03   BNP: Invalid input(s): POCBNP CBG:  Recent Labs Lab 11/13/15 0743 11/13/15 1149 11/13/15 1647 11/13/15 2100 11/14/15 0809  GLUCAP 163* 300* 257* 164* 131*   D-Dimer No results  for input(s): DDIMER in the last 72 hours. Hgb A1c No results for input(s): HGBA1C in the last 72 hours. Lipid Profile No results for input(s): CHOL, HDL, LDLCALC, TRIG, CHOLHDL, LDLDIRECT in the last 72 hours. Thyroid function studies No results for input(s): TSH, T4TOTAL, T3FREE, THYROIDAB in the last 72 hours.  Invalid input(s): FREET3 Anemia work up No results for input(s): VITAMINB12, FOLATE, FERRITIN, TIBC, IRON, RETICCTPCT in the last 72 hours. Urinalysis    Component Value Date/Time   COLORURINE YELLOW 11/09/2015 2309   APPEARANCEUR CLEAR 11/09/2015 2309   LABSPEC 1.025 11/09/2015 2309   PHURINE 5.5 11/09/2015 2309   GLUCOSEU NEGATIVE 11/09/2015 2309   HGBUR NEGATIVE 11/09/2015 2309   BILIRUBINUR NEGATIVE 11/09/2015 2309   KETONESUR NEGATIVE 11/09/2015 2309   PROTEINUR NEGATIVE 11/09/2015 2309   UROBILINOGEN 0.2 05/28/2013 0206   NITRITE NEGATIVE 11/09/2015 2309   LEUKOCYTESUR TRACE (A) 11/09/2015 2309   Sepsis Labs Invalid input(s): PROCALCITONIN,  WBC,  LACTICIDVEN Microbiology Recent Results (from the past 240 hour(s))  Urine culture     Status: Abnormal   Collection Time: 11/09/15 11:09 PM  Result Value Ref Range Status   Specimen Description URINE, CLEAN CATCH  Final   Special Requests NONE  Final   Culture (A)  Final    <10,000 COLONIES/mL INSIGNIFICANT GROWTH Performed at [facility]    Report Status 11/11/2015 FINAL  Final  Gram stain     Status: None   Collection Time: 11/10/15 11:30 AM  Result Value Ref Range Status   Specimen Description FLUID ASCITIC COLLECTED BY DOCTOR  Final   Special Requests NONE  Final   Gram Stain   Final    CYTOSPIN SMEAR WBC PRESENT, PREDOMINANTLY MONONUCLEAR NO ORGANISMS SEEN Performed at [facility]    Report Status 11/10/2015 FINAL  Final  Culture, body fluid-bottle     Status: None (Preliminary result)   Collection Time: 11/10/15 11:30 AM  Result Value Ref Range Status   Specimen Description FLUID  ASCITIC COLLECTED BY DOCTOR  Final   Special Requests BOTTLES DRAWN AEROBIC AND ANAEROBIC 10CC  Final   Culture NO GROWTH 4 DAYS  Final   Report Status PENDING  Incomplete     Time coordinating discharge: Over 30 minutes  SIGNED:  MEMON,JEHANZEB, MD  [facility] 11/14/2015, 11:42 AM Pager   If 7PM-7AM, please contact night-coverage [email or web address] Password [email or web address]

## 2015-11-15 LAB — CULTURE, BODY FLUID-BOTTLE

## 2015-11-15 LAB — CULTURE, BODY FLUID W GRAM STAIN -BOTTLE: Culture: NO GROWTH

## 2015-11-16 ENCOUNTER — Telehealth (INDEPENDENT_AMBULATORY_CARE_PROVIDER_SITE_OTHER): Payer: Self-pay | Admitting: Internal Medicine

## 2015-11-16 ENCOUNTER — Ambulatory Visit (INDEPENDENT_AMBULATORY_CARE_PROVIDER_SITE_OTHER): Payer: Medicare Other | Admitting: Internal Medicine

## 2015-11-16 DIAGNOSIS — R188 Other ascites: Secondary | ICD-10-CM

## 2015-11-16 MED ORDER — SPIRONOLACTONE 100 MG PO TABS: 300.0000 mg | ORAL_TABLET | Freq: Every day | ORAL | 3 refills | Status: DC

## 2015-11-16 NOTE — Telephone Encounter (Signed)
Rx sent to her pharmacy 

## 2015-11-16 NOTE — Telephone Encounter (Signed)
Patient called, she wants a refill on Spironolactone 100mg .  She stated that she takes 3 a day.  [phone]

## 2015-11-16 NOTE — Telephone Encounter (Signed)
This has been filled 

## 2015-11-19 DIAGNOSIS — D696 Thrombocytopenia, unspecified: Secondary | ICD-10-CM | POA: Diagnosis not present

## 2015-11-19 DIAGNOSIS — R188 Other ascites: Secondary | ICD-10-CM | POA: Diagnosis not present

## 2015-11-19 DIAGNOSIS — Z6825 Body mass index (BMI) 25.0-25.9, adult: Secondary | ICD-10-CM | POA: Diagnosis not present

## 2015-11-19 DIAGNOSIS — Z1389 Encounter for screening for other disorder: Secondary | ICD-10-CM | POA: Diagnosis not present

## 2015-11-19 DIAGNOSIS — F329 Major depressive disorder, single episode, unspecified: Secondary | ICD-10-CM | POA: Diagnosis not present

## 2015-11-19 DIAGNOSIS — E119 Type 2 diabetes mellitus without complications: Secondary | ICD-10-CM | POA: Diagnosis not present

## 2015-11-19 DIAGNOSIS — K7460 Unspecified cirrhosis of liver: Secondary | ICD-10-CM | POA: Diagnosis not present

## 2015-11-20 ENCOUNTER — Ambulatory Visit (HOSPITAL_COMMUNITY): Payer: Medicare Other

## 2015-11-26 ENCOUNTER — Ambulatory Visit (INDEPENDENT_AMBULATORY_CARE_PROVIDER_SITE_OTHER): Payer: Medicare Other | Admitting: Internal Medicine

## 2015-11-26 ENCOUNTER — Encounter (INDEPENDENT_AMBULATORY_CARE_PROVIDER_SITE_OTHER): Payer: Self-pay | Admitting: Internal Medicine

## 2015-11-26 VITALS — BP 110/80 | HR 64 | Temp 97.8°F | Resp 16 | Ht 66.0 in | Wt 151.0 lb

## 2015-11-26 DIAGNOSIS — K7682 Hepatic encephalopathy: Secondary | ICD-10-CM

## 2015-11-26 DIAGNOSIS — K7469 Other cirrhosis of liver: Secondary | ICD-10-CM

## 2015-11-26 DIAGNOSIS — K7290 Hepatic failure, unspecified without coma: Secondary | ICD-10-CM

## 2015-11-26 DIAGNOSIS — R188 Other ascites: Secondary | ICD-10-CM | POA: Diagnosis not present

## 2015-11-26 NOTE — Progress Notes (Signed)
Presenting complaint;  Follow-up for decompensated liver disease.  Subjective:  [name] is 55 year old Caucasian female who has advanced cirrhosis secondary to hepatitis C which was successfully treated with SVR over 12 years ago. She was last seen on 09/24/2015 when she went 140 pounds. She she was admitted twice to [facility] for shortness of breath felt to be secondary to tense ascites and underwent therapeutic tap on 11/10/2015 and 11/13/2015. She was also given IV albumin during hospitalization. Studies were negative for SBP. Patient states today she was discharged she weighed 163 pounds. She is now back to taking furosemide and spironolactone at usual dose. She states she has lost 12 pounds since discharge. She was scheduled to undergo abdominal tap this week but this was canceled. He continues to feel poorly. She has no energy whatsoever. She gets exhausted with simple past such as taking a bath. Her appetite is fair. She denies nausea vomiting abdominal pain melena or rectal bleeding. She is having 3-4 stools per day. She has not had any episodes of confusion. He wants to know if there is any way that she could be reconsidered for liver transplant.    Current Medications: Outpatient Encounter Prescriptions as of 11/26/2015  Medication Sig  . b complex vitamins tablet Take 1 tablet by mouth daily. Reported on 08/18/2015  . buPROPion (WELLBUTRIN) 75 MG tablet Take 75 mg by mouth daily.   Marland Kitchen escitalopram (LEXAPRO) 20 MG tablet Take 20 mg by mouth at bedtime.   . ferrous sulfate 325 (65 FE) MG tablet Take 325 mg by mouth 2 (two) times daily with a meal. Reported on 08/18/2015  . furosemide (LASIX) 40 MG tablet Take 2.5 tablets (100 mg total) by mouth daily.  Marland Kitchen lactulose (CHRONULAC) 10 GM/15ML solution Take 45 mLs (30 g total) by mouth 3 (three) times daily. (Patient taking differently: Take 30 g by mouth daily. )  . LANTUS SOLOSTAR 100 UNIT/ML Solostar Pen Inject 20 Units into the skin every evening.  Reported on 08/18/2015  . levofloxacin (LEVAQUIN) 500 MG tablet Take 500 mg by mouth daily.   . Multiple Vitamin (MULTIVITAMIN WITH MINERALS) TABS tablet Take 1 tablet by mouth daily. Reported on 08/18/2015  . Oxycodone HCl 10 MG TABS Take 10 mg by mouth daily.   . pantoprazole (PROTONIX) 40 MG tablet Take 1 tablet (40 mg total) by mouth 2 (two) times daily before a meal.  . rifaximin (XIFAXAN) 550 MG TABS tablet Take 1 tablet (550 mg total) by mouth 2 (two) times daily.  Marland Kitchen spironolactone (ALDACTONE) 100 MG tablet Take 3 tablets (300 mg total) by mouth daily.  . traZODone (DESYREL) 50 MG tablet Take 1 tablet (50 mg total) by mouth at bedtime.   No facility-administered encounter medications on file as of 11/26/2015.      Objective: Blood pressure 110/80, pulse 64, temperature 97.8 F (36.6 C), temperature source Oral, resp. rate 16, height 5\' 6"  (1.676 m), weight 151 lb (68.5 kg). Patient is alert and in no acute distress. She appears chronically ill. Asterixis is absent. Conjunctiva is pink. Sclera is nonicteric Oropharyngeal mucosa is normal. No neck masses or thyromegaly noted. Cardiac exam with regular rhythm normal S1 and S2. She has grade 2/6 systolic ejection murmur best heard at left sternal border. Lungs are clear to auscultation. Abdomen is distended but not tense. Abdomen is soft with easily palpable large spleen. She has tenderness below the right costal margin. Liver edge is nonpalpable. No rub noted over spleen. Shifting dullness is present. No  LE edema or clubbing noted that extremities are thin.  Labs/studies Results: Lab data from 11/13/2015  WBC 3.0, H&H 8.5 and 25.2 and platelet count 53K. INR 1.60  Serum sodium 132, potassium 8.7, chloride 102, CO2 27, glucose 159, BUN 12, creatinine 0.77 Bilirubin 0.9, AP 79, AST 26, ALT 29 and albumin 2.9. Serum calcium 8.0.   Assessment:  #1. Decompensated liver disease/cirrhosis complicated by ascites hepatic encephalopathy  and extreme fatigue. 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 She could not tolerate Eliquis. She bled from total enterography. I discussed patient's condition with Dr. Monica Martinez of [facility] while she was still in the office. He feels it only option is to have combined liver and small bowel transplant. He suggested contacting team at [facility] to see if they've are willing to see patient. He told me they had discussed this option and felt she was not a good candidate. #2. No evidence of overt GI bleed. #3. 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  Plan:  Patient will continue furosemide and spironolactone at present dose. She will go to the lab for the following: INR, serum ammonia, bilirubin and albumin. Contact physicians at [facility] and find out if they are willing to see patient in consultation. Office visit in 2 months.

## 2015-11-26 NOTE — Patient Instructions (Signed)
Physician will contact with results of blood tests when completed. Call office and abdomen comes tense again.

## 2015-11-27 LAB — BILIRUBIN, TOTAL: BILIRUBIN TOTAL: 1.1 mg/dL (ref 0.2–1.2)

## 2015-11-27 LAB — ALBUMIN: ALBUMIN: 3.3 g/dL — AB (ref 3.6–5.1)

## 2015-11-28 LAB — AMMONIA: AMMONIA: 39 umol/L (ref <=47)

## 2015-11-28 LAB — PROTIME-INR
INR: 1.3 — AB
PROTHROMBIN TIME: 13.8 s — AB (ref 9.0–11.5)

## 2015-12-01 ENCOUNTER — Other Ambulatory Visit (INDEPENDENT_AMBULATORY_CARE_PROVIDER_SITE_OTHER): Payer: Self-pay | Admitting: *Deleted

## 2015-12-01 DIAGNOSIS — R188 Other ascites: Secondary | ICD-10-CM

## 2015-12-01 DIAGNOSIS — K7460 Unspecified cirrhosis of liver: Secondary | ICD-10-CM

## 2015-12-03 ENCOUNTER — Encounter (INDEPENDENT_AMBULATORY_CARE_PROVIDER_SITE_OTHER): Payer: Self-pay | Admitting: *Deleted

## 2015-12-03 ENCOUNTER — Other Ambulatory Visit (INDEPENDENT_AMBULATORY_CARE_PROVIDER_SITE_OTHER): Payer: Self-pay | Admitting: *Deleted

## 2015-12-03 DIAGNOSIS — K7460 Unspecified cirrhosis of liver: Secondary | ICD-10-CM

## 2015-12-03 DIAGNOSIS — R188 Other ascites: Secondary | ICD-10-CM

## 2015-12-22 DIAGNOSIS — K7460 Unspecified cirrhosis of liver: Secondary | ICD-10-CM | POA: Diagnosis not present

## 2015-12-23 ENCOUNTER — Other Ambulatory Visit (INDEPENDENT_AMBULATORY_CARE_PROVIDER_SITE_OTHER): Payer: Self-pay | Admitting: *Deleted

## 2015-12-23 ENCOUNTER — Encounter (INDEPENDENT_AMBULATORY_CARE_PROVIDER_SITE_OTHER): Payer: Self-pay | Admitting: *Deleted

## 2015-12-23 DIAGNOSIS — K7460 Unspecified cirrhosis of liver: Secondary | ICD-10-CM

## 2015-12-23 DIAGNOSIS — R188 Other ascites: Secondary | ICD-10-CM

## 2015-12-23 DIAGNOSIS — Z8639 Personal history of other endocrine, nutritional and metabolic disease: Secondary | ICD-10-CM

## 2015-12-23 LAB — BASIC METABOLIC PANEL
BUN: 10 mg/dL (ref 7–25)
CHLORIDE: 99 mmol/L (ref 98–110)
CO2: 32 mmol/L — ABNORMAL HIGH (ref 20–31)
Calcium: 8.1 mg/dL — ABNORMAL LOW (ref 8.6–10.4)
Creat: 0.9 mg/dL (ref 0.50–1.05)
GLUCOSE: 88 mg/dL (ref 65–99)
POTASSIUM: 3.2 mmol/L — AB (ref 3.5–5.3)
Sodium: 135 mmol/L (ref 135–146)

## 2015-12-23 LAB — ALBUMIN: Albumin: 3 g/dL — ABNORMAL LOW (ref 3.6–5.1)

## 2015-12-24 LAB — CBC
HCT: 32.6 % — ABNORMAL LOW (ref 35.0–45.0)
Hemoglobin: 10.7 g/dL — ABNORMAL LOW (ref 11.7–15.5)
MCH: 26.6 pg — ABNORMAL LOW (ref 27.0–33.0)
MCHC: 32.8 g/dL (ref 32.0–36.0)
MCV: 80.9 fL (ref 80.0–100.0)
MPV: 10.4 fL (ref 7.5–12.5)
PLATELETS: 54 10*3/uL — AB (ref 140–400)
RBC: 4.03 MIL/uL (ref 3.80–5.10)
RDW: 16.5 % — AB (ref 11.0–15.0)
WBC: 3.6 10*3/uL — ABNORMAL LOW (ref 3.8–10.8)

## 2015-12-29 DIAGNOSIS — R188 Other ascites: Secondary | ICD-10-CM | POA: Diagnosis not present

## 2015-12-29 DIAGNOSIS — B182 Chronic viral hepatitis C: Secondary | ICD-10-CM | POA: Diagnosis not present

## 2015-12-29 DIAGNOSIS — I8500 Esophageal varices without bleeding: Secondary | ICD-10-CM | POA: Diagnosis not present

## 2015-12-29 DIAGNOSIS — Z0001 Encounter for general adult medical examination with abnormal findings: Secondary | ICD-10-CM | POA: Diagnosis not present

## 2015-12-29 DIAGNOSIS — I998 Other disorder of circulatory system: Secondary | ICD-10-CM | POA: Diagnosis not present

## 2015-12-29 DIAGNOSIS — D61818 Other pancytopenia: Secondary | ICD-10-CM | POA: Diagnosis not present

## 2015-12-29 DIAGNOSIS — K7460 Unspecified cirrhosis of liver: Secondary | ICD-10-CM | POA: Diagnosis not present

## 2015-12-29 DIAGNOSIS — K7290 Hepatic failure, unspecified without coma: Secondary | ICD-10-CM | POA: Diagnosis not present

## 2015-12-29 DIAGNOSIS — I81 Portal vein thrombosis: Secondary | ICD-10-CM | POA: Diagnosis not present

## 2015-12-29 DIAGNOSIS — Z6824 Body mass index (BMI) 24.0-24.9, adult: Secondary | ICD-10-CM | POA: Diagnosis not present

## 2016-01-03 ENCOUNTER — Emergency Department (HOSPITAL_COMMUNITY)
Admission: EM | Admit: 2016-01-03 | Discharge: 2016-01-03 | Disposition: A | Discharge status: Home / Self Care | Payer: Medicare Other | Attending: Emergency Medicine | Admitting: Emergency Medicine

## 2016-01-03 ENCOUNTER — Encounter (HOSPITAL_COMMUNITY): Payer: Self-pay | Admitting: *Deleted

## 2016-01-03 ENCOUNTER — Emergency Department (HOSPITAL_COMMUNITY): Payer: Medicare Other

## 2016-01-03 DIAGNOSIS — E119 Type 2 diabetes mellitus without complications: Secondary | ICD-10-CM | POA: Diagnosis not present

## 2016-01-03 DIAGNOSIS — D500 Iron deficiency anemia secondary to blood loss (chronic): Secondary | ICD-10-CM | POA: Diagnosis not present

## 2016-01-03 DIAGNOSIS — R188 Other ascites: Secondary | ICD-10-CM | POA: Diagnosis not present

## 2016-01-03 DIAGNOSIS — D696 Thrombocytopenia, unspecified: Secondary | ICD-10-CM

## 2016-01-03 DIAGNOSIS — R1084 Generalized abdominal pain: Secondary | ICD-10-CM | POA: Diagnosis not present

## 2016-01-03 DIAGNOSIS — D649 Anemia, unspecified: Secondary | ICD-10-CM | POA: Insufficient documentation

## 2016-01-03 DIAGNOSIS — Z87891 Personal history of nicotine dependence: Secondary | ICD-10-CM | POA: Insufficient documentation

## 2016-01-03 DIAGNOSIS — Z79899 Other long term (current) drug therapy: Secondary | ICD-10-CM | POA: Diagnosis not present

## 2016-01-03 DIAGNOSIS — R109 Unspecified abdominal pain: Secondary | ICD-10-CM | POA: Diagnosis not present

## 2016-01-03 DIAGNOSIS — K7460 Unspecified cirrhosis of liver: Secondary | ICD-10-CM | POA: Insufficient documentation

## 2016-01-03 LAB — COMPREHENSIVE METABOLIC PANEL
ALBUMIN: 2.9 g/dL — AB (ref 3.5–5.0)
ALT: 27 U/L (ref 14–54)
AST: 39 U/L (ref 15–41)
Alkaline Phosphatase: 96 U/L (ref 38–126)
Anion gap: 5 (ref 5–15)
BILIRUBIN TOTAL: 1.3 mg/dL — AB (ref 0.3–1.2)
BUN: 10 mg/dL (ref 6–20)
CO2: 27 mmol/L (ref 22–32)
Calcium: 8 mg/dL — ABNORMAL LOW (ref 8.9–10.3)
Chloride: 99 mmol/L — ABNORMAL LOW (ref 101–111)
Creatinine, Ser: 0.82 mg/dL (ref 0.44–1.00)
GFR calc Af Amer: >60 mL/min (ref >60)
GFR calc non Af Amer: >60 mL/min (ref >60)
GLUCOSE: 200 mg/dL — AB (ref 65–99)
POTASSIUM: 3.4 mmol/L — AB (ref 3.5–5.1)
Sodium: 131 mmol/L — ABNORMAL LOW (ref 135–145)
TOTAL PROTEIN: 6.5 g/dL (ref 6.5–8.1)

## 2016-01-03 LAB — CBC WITH DIFFERENTIAL/PLATELET
BASOS ABS: 0 10*3/uL (ref 0.0–0.1)
BASOS PCT: 1 %
Eosinophils Absolute: 0.2 10*3/uL (ref 0.0–0.7)
Eosinophils Relative: 4 %
HEMATOCRIT: 33.8 % — AB (ref 36.0–46.0)
HEMOGLOBIN: 11 g/dL — AB (ref 12.0–15.0)
Lymphocytes Relative: 13 %
Lymphs Abs: 0.6 10*3/uL — ABNORMAL LOW (ref 0.7–4.0)
MCH: 27.2 pg (ref 26.0–34.0)
MCHC: 32.5 g/dL (ref 30.0–36.0)
MCV: 83.5 fL (ref 78.0–100.0)
MONO ABS: 0.3 10*3/uL (ref 0.1–1.0)
Monocytes Relative: 6 %
NEUTROS ABS: 3.7 10*3/uL (ref 1.7–7.7)
NEUTROS PCT: 77 %
Platelets: 54 10*3/uL — ABNORMAL LOW (ref 150–400)
RBC: 4.05 MIL/uL (ref 3.87–5.11)
RDW: 16 % — AB (ref 11.5–15.5)
WBC: 4.8 10*3/uL (ref 4.0–10.5)

## 2016-01-03 LAB — URINALYSIS, ROUTINE W REFLEX MICROSCOPIC
Bilirubin Urine: NEGATIVE
GLUCOSE, UA: NEGATIVE mg/dL
Hgb urine dipstick: NEGATIVE
KETONES UR: NEGATIVE mg/dL
LEUKOCYTES UA: NEGATIVE
Nitrite: NEGATIVE
PH: 5.5 (ref 5.0–8.0)
Protein, ur: NEGATIVE mg/dL
Specific Gravity, Urine: <1.005 — ABNORMAL LOW (ref 1.005–1.030)

## 2016-01-03 LAB — LIPASE, BLOOD: Lipase: 47 U/L (ref 11–51)

## 2016-01-03 LAB — PROTIME-INR
INR: 1.63
Prothrombin Time: 19.5 seconds — ABNORMAL HIGH (ref 11.4–15.2)

## 2016-01-03 LAB — AMMONIA: AMMONIA: 61 umol/L — AB (ref 9–35)

## 2016-01-03 MED ORDER — PROMETHAZINE HCL 25 MG/ML IJ SOLN
12.5000 mg | Freq: Once | INTRAMUSCULAR | Status: DC
  Filled 2016-01-03: qty 1

## 2016-01-03 MED ORDER — HYDROMORPHONE HCL 1 MG/ML IJ SOLN
1.0000 mg | Freq: Once | INTRAMUSCULAR | Status: AC
  Administered 2016-01-03: 1 mg via INTRAVENOUS
  Filled 2016-01-03: qty 1

## 2016-01-03 MED ORDER — ONDANSETRON HCL 4 MG/2ML IJ SOLN
4.0000 mg | Freq: Once | INTRAMUSCULAR | Status: AC
  Administered 2016-01-03: 4 mg via INTRAVENOUS
  Filled 2016-01-03: qty 2

## 2016-01-03 MED ORDER — PROMETHAZINE HCL 25 MG/ML IJ SOLN
12.5000 mg | Freq: Once | INTRAMUSCULAR | Status: AC
  Administered 2016-01-03: 12.5 mg via INTRAMUSCULAR

## 2016-01-03 MED ORDER — PROMETHAZINE HCL 25 MG RE SUPP: 25.0000 mg | Freq: Four times a day (QID) | RECTAL | 0 refills | Status: DC | PRN

## 2016-01-03 MED ORDER — FENTANYL 50 MCG/HR TD PT72
50.0000 ug | MEDICATED_PATCH | TRANSDERMAL | Status: DC
  Administered 2016-01-03: 50 ug via TRANSDERMAL
  Filled 2016-01-03: qty 1

## 2016-01-03 MED ORDER — MORPHINE SULFATE (PF) 4 MG/ML IV SOLN
4.0000 mg | Freq: Once | INTRAVENOUS | Status: AC
Start: 2016-01-03 — End: 2016-01-03
  Administered 2016-01-03: 4 mg via INTRAVENOUS
  Filled 2016-01-03: qty 1

## 2016-01-03 NOTE — ED Notes (Signed)
Pt requesting something for nausea, Dr Gilford Raid notified, additional orders given,

## 2016-01-03 NOTE — ED Triage Notes (Signed)
Pt states bloody stool yesterday, bright red in color

## 2016-01-03 NOTE — ED Triage Notes (Signed)
Pt with RuQ abd pain recurrent  From liver disease cirrosis.  + nausea

## 2016-01-03 NOTE — ED Provider Notes (Signed)
[facility] DEPT Provider Note   CSN: [id number] Arrival date & time: 01/03/16  1607     History   Chief Complaint Chief Complaint  Patient presents with  . Abdominal Pain    HPI [name] is a 55 y.o. female.  Pt presents to the ED today with uncontrolled pain.  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  The pt has was taken off the transplant list because of that.  The pt is on coumadin for the SMV thrombus.  The pt is on oxycodone for pain which is not helping.  The pt told the nurse that she had a bloody stool yesterday, but she told me she did not.  She is followed locally by Dr. Laural Golden.      Past Medical History:  Diagnosis Date  . Anemia   . Anxiety   . Ascites   . Cirrhosis ([place])    non alcoholic  . Complication of anesthesia   . Depression   . Diabetes mellitus type 2, controlled, without complications ([place])   . Enteropathy 07/31/2015   Portal hypertensive enteropathy per capsule study, with stigmata of bleeding.  . Esophageal varices ([place])   . Gastric ulcer 07/15/2015   per EGD; no stigmata of bleeding  . GI bleed 07/17/2015  . Gram-negative bacteremia 12/08/2011  . HKVQQVZD(638.7)    "monthly" (11/07/2012)  . Heart murmur   . Hepatitis C    Hx: of Hep "C" it was eradicated  . History of hepatitis C - successfuly treated medically 07/14/2015  . Kidney stones   . Liver failure ([place])    "I'm on liver transplant list @ [facility]" (11/07/2012)  . PONV (postoperative nausea and vomiting)   . Portal vein thrombosis   . Renal insufficiency   . Right ureteral stone 12/07/2011  . 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 Type II diabetes mellitus ([place])    "not since gastric bypass" (11/07/2012)    Patient Active Problem List   Diagnosis Date Noted  . Pancytopenia ([place]) 11/13/2015  . Cirrhosis of liver with ascites ([place])   . Abdominal pain 11/10/2015  . Alcoholic  cirrhosis of liver with ascites ([place])   . Enteropathy 07/31/2015  . Anemia, blood loss   . Depression with anxiety 07/30/2015  . Esophageal varices in cirrhosis ([place]) 07/30/2015  . Gastritis determined by endoscopy 07/30/2015  . Hypotension 07/30/2015  . Hyponatremia 07/29/2015  . Hypokalemia 07/29/2015  . GI bleeding 07/29/2015  . Gastric ulcer 07/29/2015  . Leukopenia 07/29/2015  . AKI (acute kidney injury) ([place]) 07/29/2015  . Diabetes mellitus type 2, controlled, without complications ([place]) 56/43/3295  . GI bleed 07/17/2015  . Palliative care encounter   . Goals of care, counseling/discussion   . DNR (do not resuscitate) discussion   . 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 Melena 07/14/2015  . Hepatic cirrhosis due to chronic hepatitis C infection ([place]) 07/14/2015  . History of hepatitis C - successfuly treated medically 07/14/2015  . Portal vein thrombosis 05/13/2014  . Other malaise and fatigue 08/05/2013  . Hypoxemia 08/05/2013  . Abdominal pain, right upper quadrant 05/29/2013  . SBP (spontaneous bacterial peritonitis) ([place]) 05/28/2013  . Distal radius fracture 10/25/2012  . Metatarsal bone fracture 10/25/2012  . Insomnia 09/25/2012  . Encephalopathy, hepatic ([place]) 06/07/2012  . Hyperglycemia 06/06/2012  . LUQ abdominal pain 06/05/2012  . Ascites 05/14/2012  .  Gram-negative bacteremia 12/08/2011  . Pyelonephritis 12/07/2011  . Hydronephrosis of right kidney 12/07/2011  . Right ureteral stone 12/07/2011  . Thrombocytopenia ([place]) 12/07/2011  . Cirrhosis of liver ([place]) 05/02/2011  . Acute blood loss anemia 05/02/2011    Past Surgical History:  Procedure Laterality Date  . ABDOMINAL HYSTERECTOMY  2003  . CESAREAN SECTION  1995  . CHOLECYSTECTOMY  1987  . COLONOSCOPY    . COLONOSCOPY  10/21/2011   Procedure: COLONOSCOPY;  Surgeon: Rogene Houston, MD;  Location: [facility];  Service: Endoscopy;  Laterality: N/A;  245   . CYSTOSCOPY W/ RETROGRADES   12/08/2011   Procedure: CYSTOSCOPY WITH RETROGRADE PYELOGRAM;  Surgeon: Marissa Nestle, MD;  Location: [facility];  Service: Urology;  Laterality: Right;  . CYSTOSCOPY WITH STENT PLACEMENT  12/08/2011   Procedure: CYSTOSCOPY WITH STENT PLACEMENT;  Surgeon: Marissa Nestle, MD;  Location: [facility];  Service: Urology;  Laterality: Right;  . DILATION AND CURETTAGE OF UTERUS    . ESOPHAGOGASTRODUODENOSCOPY N/A 02/19/2014   Procedure: ESOPHAGOGASTRODUODENOSCOPY (EGD);  Surgeon: Rogene Houston, MD;  Location: [facility];  Service: Endoscopy;  Laterality: N/A;  100  . ESOPHAGOGASTRODUODENOSCOPY N/A 07/15/2015   Procedure: ESOPHAGOGASTRODUODENOSCOPY (EGD);  Surgeon: Rogene Houston, MD;  [facility];  Service: Endoscopy;  Laterality: N/A;  . ESOPHAGOGASTRODUODENOSCOPY N/A 07/29/2015   Procedure: ESOPHAGOGASTRODUODENOSCOPY (EGD);  Surgeon: Rogene Houston, MD;  [facility];  Service: Endoscopy;  Laterality: N/A;  . GASTRIC BYPASS  ~ 1995  . HERNIA REPAIR  04/14/11   "one in my bellybutton" (11/07/2012)  . INGUINAL HERNIA REPAIR Right    "maybe 2" (11/07/2012)  . KNEE ARTHROSCOPY Right   . LITHOTRIPSY     "several times" (11/07/2012)  . OPEN REDUCTION INTERNAL FIXATION (ORIF) DISTAL RADIAL FRACTURE Right 11/07/2012   Procedure: OPEN REDUCTION INTERNAL FIXATION (ORIF) RIGHT DISTAL RADIAL FRACTURE;  Surgeon: [name], MD;  Location: [facility];  Service: Orthopedics;  Laterality: Right;  . ORIF DISTAL RADIUS FRACTURE Right 11/07/2012  . UPPER GASTROINTESTINAL ENDOSCOPY      OB History    Gravida Para Term Preterm AB Living   1 1 1          SAB TAB Ectopic Multiple Live Births                   Home Medications    Prior to Admission medications   Medication Sig Start Date End Date Taking? Authorizing Provider  b complex vitamins tablet Take 1 tablet by mouth daily. Reported on 08/18/2015   Yes Historical Provider, MD  buPROPion (WELLBUTRIN) 75 MG tablet Take 75 mg by mouth  daily.  11/19/15  Yes Historical Provider, MD  escitalopram (LEXAPRO) 20 MG tablet Take 20 mg by mouth at bedtime.  11/21/15  Yes Historical Provider, MD  ferrous sulfate 325 (65 FE) MG tablet Take 325 mg by mouth 2 (two) times daily with a meal. Reported on 08/18/2015   Yes Historical Provider, MD  furosemide (LASIX) 40 MG tablet Take 2.5 tablets (100 mg total) by mouth daily. 11/14/15  Yes Kathie Dike, MD  lactulose (CHRONULAC) 10 GM/15ML solution Take 45 mLs (30 g total) by mouth 3 (three) times daily. Patient taking differently: Take 30 g by mouth daily.  09/24/15  Yes Rogene Houston, MD  LANTUS SOLOSTAR 100 UNIT/ML Solostar Pen Inject 20 Units into the skin every evening. Reported on 08/18/2015 08/27/14  Yes Historical Provider, MD  Multiple Vitamin (  MULTIVITAMIN WITH MINERALS) TABS tablet Take 1 tablet by mouth daily. Reported on 08/18/2015   Yes Historical Provider, MD  Oxycodone HCl 10 MG TABS Take 10 mg by mouth 3 (three) times daily as needed (for pain).  11/20/15  Yes Historical Provider, MD  pantoprazole (PROTONIX) 40 MG tablet Take 1 tablet (40 mg total) by mouth 2 (two) times daily before a meal. 09/24/15  Yes Rogene Houston, MD  potassium chloride (K-DUR) 10 MEQ tablet Take 20 mEq by mouth daily. 12/23/15  Yes Historical Provider, MD  rifaximin (XIFAXAN) 550 MG TABS tablet Take 1 tablet (550 mg total) by mouth 2 (two) times daily. 09/24/15  Yes Rogene Houston, MD  spironolactone (ALDACTONE) 100 MG tablet Take 3 tablets (300 mg total) by mouth daily. 11/16/15  Yes Butch Penny, NP  traZODone (DESYREL) 50 MG tablet Take 1 tablet (50 mg total) by mouth at bedtime. 11/11/15  Yes Thurnell Lose, MD    Family History Family History  Problem Relation Age of Onset  . Dementia Mother   . Heart disease Father   . Liver disease Father   . Hypertension Father   . Diabetes Father   . Dementia Father   . Hypertension Brother   . Other Son     Cervical dystonia    Social History Social  History  Substance Use Topics  . Smoking status: Former Smoker    Packs/day: 0.50    Years: 5.00    Types: Cigarettes    Quit date: 05/02/1991  . Smokeless tobacco: Never Used  . Alcohol use No     Allergies   Ancef [cefazolin sodium]; Gadobenate; Iodinated diagnostic agents; and Tape   Review of Systems Review of Systems  Gastrointestinal: Positive for abdominal pain.  All other systems reviewed and are negative.    Physical Exam Updated Vital Signs BP 127/58 (BP Location: Right Arm)   Pulse 70   Temp 99 F (37.2 C) (Temporal)   Resp 18   Ht 5\' 6"  (1.676 m)   Wt 145 lb (65.8 kg)   SpO2 94%   BMI 23.40 kg/m   Physical Exam  Constitutional: She is oriented to person, place, and time. She appears well-developed and well-nourished.  HENT:  Head: Normocephalic and atraumatic.  Right Ear: External ear normal.  Left Ear: External ear normal.  Nose: Nose normal.  Mouth/Throat: Oropharynx is clear and moist.  Eyes: Conjunctivae and EOM are normal. Pupils are equal, round, and reactive to light.  Neck: Normal range of motion. Neck supple.  Cardiovascular: Normal rate, regular rhythm, normal heart sounds and intact distal pulses.   Pulmonary/Chest: Effort normal and breath sounds normal.  Abdominal: She exhibits fluid wave and ascites. There is hepatosplenomegaly. There is generalized tenderness.  Musculoskeletal: Normal range of motion.  Neurological: She is alert and oriented to person, place, and time.  Skin: Skin is warm.  Psychiatric: She has a normal mood and affect. Her behavior is normal. Judgment and thought content normal.  Nursing note and vitals reviewed.    ED Treatments / Results  Labs (all labs ordered are listed, but only abnormal results are displayed) Labs Reviewed  CBC WITH DIFFERENTIAL/PLATELET - Abnormal; Notable for the following:       Result Value   Hemoglobin 11.0 (*)    HCT 33.8 (*)    RDW 16.0 (*)    Platelets 54 (*)    Lymphs Abs 0.6  (*)    All other components within normal  limits  COMPREHENSIVE METABOLIC PANEL - Abnormal; Notable for the following:    Sodium 131 (*)    Potassium 3.4 (*)    Chloride 99 (*)    Glucose, Bld 200 (*)    Calcium 8.0 (*)    Albumin 2.9 (*)    Total Bilirubin 1.3 (*)    All other components within normal limits  URINALYSIS, ROUTINE W REFLEX MICROSCOPIC (NOT AT [facility]) - Abnormal; Notable for the following:    Specific Gravity, Urine <1.005 (*)    All other components within normal limits  PROTIME-INR - Abnormal; Notable for the following:    Prothrombin Time 19.5 (*)    All other components within normal limits  AMMONIA - Abnormal; Notable for the following:    Ammonia 61 (*)    All other components within normal limits  LIPASE, BLOOD    EKG  EKG Interpretation None       Radiology Dg Abd Acute W/chest  Result Date: 01/03/2016 CLINICAL DATA:  Abdominal pain and distention.  Hepatic cirrhosis. EXAM: DG ABDOMEN ACUTE W/ 1V CHEST COMPARISON:  Multiple exams, including 11/12/2015 and 08/19/2015 FINDINGS: Airspace opacity at the right lung base obscuring the right hemidiaphragm, a component of this is due to pleural effusion, and passive atelectasis. The amount of pleural fluid is increased from 11/12/2015 and overall considered moderate in volume. Cardiac and mediastinal margins appear normal. The lungs appear otherwise clear. No free intraperitoneal gas beneath the hemidiaphragms. Numerous clips in all 4 quadrants of the abdomen. There several air-fluid levels in nondilated small bowel. Formed stool in the colon. Possible mild fold effacement and right-sided small bowel loops. IMPRESSION: 1. No small bowel wall dilatation to suggest obstruction, although there is questionably some fold effacement in small bowel loops in the right abdomen. 2. Extensive postoperative findings in the abdomen with scattered clips throughout the abdomen. 3. Moderate right pleural effusion with passive  atelectasis. Electronically Signed   By: Van Clines M.D.   On: 01/03/2016 18:38    Procedures Procedures (including critical care time)  Medications Ordered in ED Medications  HYDROmorphone (DILAUDID) injection 1 mg (not administered)  fentaNYL (DURAGESIC - dosed mcg/hr) 50 mcg (not administered)  morphine 4 MG/ML injection 4 mg (4 mg Intravenous Given 01/03/16 1804)  ondansetron (ZOFRAN) injection 4 mg (4 mg Intravenous Given 01/03/16 1804)     Initial Impression / Assessment and Plan / ED Course  I have reviewed the triage vital signs and the nursing notes.  Pertinent labs & imaging results that were available during my care of the patient were reviewed by me and considered in my medical decision making (see chart for details).  Clinical Course    Pt is still having pain, so she will be given dilaudid.  She has chronic anemia and chronic thrombocytopenia.  Her INR is not quite therapeutic, but am hesitant to give lovenox as she is so thrombocytopenic.  The pt is due for her coumadin tonight, and is encouraged to take it.  She will be given 1 fentanyl 50 mcg patch here to carry her through until tomorrow when she can talk to her doctors.  She is already on oxycodone 10s, so I don't want to give her another rx for pain.  Final Clinical Impressions(s) / ED Diagnoses   Final diagnoses:  Generalized abdominal pain  Cirrhosis of liver with ascites, unspecified hepatic cirrhosis type (HCC)  Chronic anemia  Thrombocytopenia (HCC)    New Prescriptions New Prescriptions   No medications on  file     Isla Pence, MD 01/03/16 2107

## 2016-01-03 NOTE — ED Notes (Signed)
Pt assisted to restroom.  

## 2016-01-06 DIAGNOSIS — K7460 Unspecified cirrhosis of liver: Secondary | ICD-10-CM | POA: Diagnosis not present

## 2016-01-06 DIAGNOSIS — Z8639 Personal history of other endocrine, nutritional and metabolic disease: Secondary | ICD-10-CM | POA: Diagnosis not present

## 2016-01-07 LAB — BASIC METABOLIC PANEL
BUN: 10 mg/dL (ref 7–25)
CALCIUM: 8.2 mg/dL — AB (ref 8.6–10.4)
CO2: 32 mmol/L — ABNORMAL HIGH (ref 20–31)
CREATININE: 0.98 mg/dL (ref 0.50–1.05)
Chloride: 97 mmol/L — ABNORMAL LOW (ref 98–110)
Glucose, Bld: 215 mg/dL — ABNORMAL HIGH (ref 65–99)
Potassium: 3.5 mmol/L (ref 3.5–5.3)
Sodium: 134 mmol/L — ABNORMAL LOW (ref 135–146)

## 2016-01-14 ENCOUNTER — Ambulatory Visit (INDEPENDENT_AMBULATORY_CARE_PROVIDER_SITE_OTHER): Payer: Medicare Other | Admitting: Internal Medicine

## 2016-01-14 ENCOUNTER — Encounter (INDEPENDENT_AMBULATORY_CARE_PROVIDER_SITE_OTHER): Payer: Self-pay | Admitting: Internal Medicine

## 2016-01-14 VITALS — BP 108/70 | HR 64 | Temp 98.6°F | Resp 18 | Ht 66.0 in | Wt 151.8 lb

## 2016-01-14 DIAGNOSIS — R188 Other ascites: Secondary | ICD-10-CM

## 2016-01-14 DIAGNOSIS — K7460 Unspecified cirrhosis of liver: Secondary | ICD-10-CM

## 2016-01-14 DIAGNOSIS — R1010 Upper abdominal pain, unspecified: Secondary | ICD-10-CM | POA: Diagnosis not present

## 2016-01-14 DIAGNOSIS — K7290 Hepatic failure, unspecified without coma: Secondary | ICD-10-CM

## 2016-01-14 DIAGNOSIS — K7682 Hepatic encephalopathy: Secondary | ICD-10-CM

## 2016-01-14 NOTE — Progress Notes (Signed)
Presenting complaint;  Follow-up for chronic liver disease.  Subjective:  [name] is 55 year old Caucasian female was decompensated chronic liver disease and is here for scheduled visit. She was last seen on 11/26/2015. She was on waiting list for liver transplant for several months. She was taken off the list earlier this year because she had SMV thrombosis despite therapy with warfarin for over one year. She states she has called transplant coordinator at [facility] and is waiting for the response. She wants to be referred to her mother history center where she could be reconsidered for liver transplant. He was seen in emergency room on 01/03/2016 for right-sided abdominal pain. She was treated with pain medication and given fentanyl patch. She is using patch on as-needed basis. She was also given prescription for oxycodone which she using sparingly. She denies fever or chills. She has not gained any weight since her last visit. She complains of extreme weakness. She states on some days she has no energy whatsoever and just stays in bed. She has 2-3 bowel movements daily. She denies melena or rectal bleeding. She received a call from [facility] to come for surveillance colonoscopy but she decided to wait. She would like to have it locally needs to be done.   Current Medications: Outpatient Encounter Prescriptions as of 01/14/2016  Medication Sig  . b complex vitamins tablet Take 1 tablet by mouth daily. Reported on 08/18/2015  . buPROPion (WELLBUTRIN) 75 MG tablet Take 75 mg by mouth daily.   Marland Kitchen escitalopram (LEXAPRO) 20 MG tablet Take 20 mg by mouth at bedtime.   . fentaNYL (DURAGESIC - DOSED MCG/HR) 25 MCG/HR patch Place 25 mcg onto the skin every 3 (three) days.   . ferrous sulfate 325 (65 FE) MG tablet Take 325 mg by mouth 2 (two) times daily with a meal. Reported on 08/18/2015  . furosemide (LASIX) 40 MG tablet Take 2.5 tablets (100 mg total) by mouth daily.  Marland Kitchen lactulose  (CHRONULAC) 10 GM/15ML solution Take 45 mLs (30 g total) by mouth 3 (three) times daily. (Patient taking differently: Take 30 g by mouth daily. )  . LANTUS SOLOSTAR 100 UNIT/ML Solostar Pen Inject 20 Units into the skin every evening. Reported on 08/18/2015  . Multiple Vitamin (MULTIVITAMIN WITH MINERALS) TABS tablet Take 1 tablet by mouth daily. Reported on 08/18/2015  . Oxycodone HCl 10 MG TABS Take 10 mg by mouth 3 (three) times daily as needed (for pain).   . pantoprazole (PROTONIX) 40 MG tablet Take 1 tablet (40 mg total) by mouth 2 (two) times daily before a meal.  . potassium chloride (K-DUR) 10 MEQ tablet Take 20 mEq by mouth daily.  . promethazine (PHENERGAN) 25 MG suppository Place 1 suppository (25 mg total) rectally every 6 (six) hours as needed for nausea or vomiting.  . rifaximin (XIFAXAN) 550 MG TABS tablet Take 1 tablet (550 mg total) by mouth 2 (two) times daily.  Marland Kitchen spironolactone (ALDACTONE) 100 MG tablet Take 3 tablets (300 mg total) by mouth daily.  . traZODone (DESYREL) 50 MG tablet Take 1 tablet (50 mg total) by mouth at bedtime.   No facility-administered encounter medications on file as of 01/14/2016.      Objective: Blood pressure 108/70, pulse 64, temperature 98.6 F (37 C), temperature source Oral, resp. rate 18, height 5\' 6"  (1.676 m), weight 151 lb 12.8 oz (68.9 kg). Patient is alert and in no acute distress. She does not have asterixis. Conjunctiva is pink. Sclera is nonicteric  Oropharyngeal mucosa is normal. No neck masses or thyromegaly noted. Cardiac exam with regular rhythm normal S1 and S2. Grade 2/6 systolic ejection murmur best heard at left sternal border. Lungs are clear to auscultation. Abdomen is full with bulging flanks. On palpation it soft. Large easily palpable spleen. Liver edge is indistinct. She has vague tenderness in right midabdomen as well as below the left costal margin. No rub noted over spleen. No LE edema or clubbing  noted.  Labs/studies Results: Lab data from 01/06/2016 Serum sodium 134, potassium 3.5, chloride 97, CO2 32, glucose 2:15, BUN 10 and creatinine 0.98.  Lab data from 01/03/2016 Serum ammonia 61 INR 1.63  WBC 4.8, H&H 11 and 33.8 and platelet count 54K. Bilirubin 1.3, AP 96, AST 39, ALT 27, total protein 6.5 and albumin 2.9.   Assessment:   Decompensated cirrhosis secondary to successfully treated chronic hepatitis C over 11 years ago. 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 Her ascites appears to be moderate and not tense. Therefore she does not need abdominal tap. Clinically she does not appear to be encephalopathic. Her H&H has been coming up since she has been off blood in her and has not experienced any more GI bleed. Abdominal pain. She has had intermittent pain since she had bariatric surgery over 12 years ago. Etiology of pain is not clear. Patient's extreme fatigue is due to advanced chronic liver disease and her medications may also be contradicting to her fatigue. Thrombocytopenia secondary to chronic liver disease. Her meld score remains low at around 10.  Plan:  Decrease ferrous sulfate to 1 tablets daily. Use Phenergan suppository only when absolutely needed at reduced dose of 12.5 mg by mouth daily when necessary. Patient will check with Dr. Gerarda Fraction if she could come off one of her antidepressant. Patient advised to keep pain med use to minimum. Will confer with Dr. Colonel Bald of [facility] if patient could be evaluated for combined liver and small bowel transplant. Patient will have CBC, comprehensive chemistry panel and INR in 4 weeks. Office visit in 2 months.

## 2016-01-14 NOTE — Patient Instructions (Signed)
Blood work to be done in one month. 

## 2016-01-18 ENCOUNTER — Encounter (INDEPENDENT_AMBULATORY_CARE_PROVIDER_SITE_OTHER): Payer: Self-pay | Admitting: Internal Medicine

## 2016-01-22 ENCOUNTER — Ambulatory Visit (HOSPITAL_COMMUNITY)
Admission: RE | Admit: 2016-01-22 | Discharge: 2016-01-22 | Disposition: A | Discharge status: Home / Self Care | Payer: Medicare Other | Source: Ambulatory Visit | Attending: Internal Medicine | Admitting: Internal Medicine

## 2016-01-22 ENCOUNTER — Encounter (HOSPITAL_COMMUNITY): Payer: Self-pay

## 2016-01-22 DIAGNOSIS — K7460 Unspecified cirrhosis of liver: Secondary | ICD-10-CM | POA: Diagnosis not present

## 2016-01-22 DIAGNOSIS — R188 Other ascites: Secondary | ICD-10-CM | POA: Diagnosis not present

## 2016-01-22 LAB — GRAM STAIN

## 2016-01-22 MED ORDER — ALBUMIN HUMAN 25 % IV SOLN
50.0000 g | Freq: Once | INTRAVENOUS | Status: AC
  Administered 2016-01-22: 50 g via INTRAVENOUS

## 2016-01-22 MED ORDER — ALBUMIN HUMAN 25 % IV SOLN
INTRAVENOUS | Status: AC
  Administered 2016-01-22: 50 g via INTRAVENOUS
  Filled 2016-01-22: qty 200

## 2016-01-22 NOTE — Procedures (Signed)
PreOperative Dx: Cirrhosis, ascites Postoperative Dx: Cirrhosis, ascites Procedure:   US guided paracentesis Radiologist:  Toron Bowring Anesthesia:  10 ml of1% lidocaine Specimen:  3 L of yellow ascitic fluid EBL:   < 1 ml Complications: None  

## 2016-01-22 NOTE — Progress Notes (Signed)
Paracentetics complete no signs of distress. 3L yellow colored ascites removed.

## 2016-01-27 DIAGNOSIS — M542 Cervicalgia: Secondary | ICD-10-CM | POA: Diagnosis not present

## 2016-01-27 DIAGNOSIS — M25511 Pain in right shoulder: Secondary | ICD-10-CM | POA: Diagnosis not present

## 2016-01-27 DIAGNOSIS — G8929 Other chronic pain: Secondary | ICD-10-CM | POA: Diagnosis not present

## 2016-01-27 LAB — CULTURE, BODY FLUID W GRAM STAIN -BOTTLE

## 2016-01-27 LAB — CULTURE, BODY FLUID-BOTTLE: CULTURE: NO GROWTH

## 2016-02-03 ENCOUNTER — Telehealth (INDEPENDENT_AMBULATORY_CARE_PROVIDER_SITE_OTHER): Payer: Self-pay | Admitting: *Deleted

## 2016-02-03 NOTE — Telephone Encounter (Signed)
Patient called the OV this morning and states that she has not had a BM in 3 week. She is taking her Lactulose as directed. She has also taken Mag citrate , Colace. Patient states that she went for her tap on 01/21/2016, 3 liters was drawn off , and she says that she was told at that time that she was full of stool. She ask what else can she try.  Per Dr.Rehman the patient should get Fleet Enemas and use 1 per rectal until she has a BM. She is continue taking that lactulose as directed.  Also , Dr.Rehman ask that a referral be made to Dr. Colonel Bald for patient. This is for liver and small bowel transplant.

## 2016-02-03 NOTE — Telephone Encounter (Signed)
Referral & nyes faxed to [facility] transplant center and they contact patient with appt

## 2016-02-06 ENCOUNTER — Emergency Department (HOSPITAL_COMMUNITY)
Admission: EM | Admit: 2016-02-06 | Discharge: 2016-02-07 | Disposition: A | Discharge status: Home / Self Care | Payer: Medicare Other | Attending: Emergency Medicine | Admitting: Emergency Medicine

## 2016-02-06 ENCOUNTER — Encounter (HOSPITAL_COMMUNITY): Payer: Self-pay | Admitting: *Deleted

## 2016-02-06 DIAGNOSIS — Z87891 Personal history of nicotine dependence: Secondary | ICD-10-CM | POA: Insufficient documentation

## 2016-02-06 DIAGNOSIS — Z79899 Other long term (current) drug therapy: Secondary | ICD-10-CM | POA: Diagnosis not present

## 2016-02-06 DIAGNOSIS — K5900 Constipation, unspecified: Secondary | ICD-10-CM | POA: Insufficient documentation

## 2016-02-06 DIAGNOSIS — E119 Type 2 diabetes mellitus without complications: Secondary | ICD-10-CM | POA: Diagnosis not present

## 2016-02-06 NOTE — ED Provider Notes (Addendum)
By signing my name below, I, [name], attest that this documentation has been prepared under the direction and in the presence of [name], DO. Electronically Signed: Macon Russell, ED Scribe. 02/07/16. 12:12 AM.  TIME SEEN: 11:31 PM   CHIEF COMPLAINT: constipation  HPI:  [name] is a 55 y.o. female who presents to the Emergency Department complaining of moderate, constant, constipation onset a week ago. Per pt she has history of alcoholic cirrhosis and SMV thrombus. She notes using Miralax, lactulose, prune juice and enemas with no relief. Pt reports using 4 enemas with minimal relief. She states "yellowish saw dust" liquid is the only thing she outputs. States she has been passing gas. Per pt, she reports associated appetite change, but notes she is tolerating liquids without difficulty. Per pt, she notes having hemorrhoids due to straining. Denies bloody stools or melena. She notes being on eliquis, but states she is not currently on any blood thinners at the moment because history of GI bleed. Per pt note, pt reports she was put on the liver transplant list but was taken off on 03/2015 due to having 3 blood clots. Denies increasing abdominal pain, fevers, vomiting.  ROS: See HPI Constitutional: no fever  Eyes: no drainage  ENT: no runny nose   Cardiovascular:  no chest pain  Resp: no SOB  GI: no vomiting GU: no dysuria Integumentary: no rash  Allergy: no hives  Musculoskeletal: no leg swelling  Neurological: no slurred speech ROS otherwise negative  PAST MEDICAL HISTORY/PAST SURGICAL HISTORY:  Past Medical History:  Diagnosis Date  . Anemia   . Anxiety   . Ascites   . Cirrhosis ([place])    non alcoholic  . Complication of anesthesia   . Depression   . Diabetes mellitus type 2, controlled, without complications ([place])   . Enteropathy 07/31/2015   Portal hypertensive enteropathy per capsule study, with stigmata of bleeding.  . Esophageal varices ([place])   .  Gastric ulcer 07/15/2015   per EGD; no stigmata of bleeding  . GI bleed 07/17/2015  . Gram-negative bacteremia 12/08/2011  . TDDUKGUR(427.0)    "monthly" (11/07/2012)  . Heart murmur   . Hepatitis C    Hx: of Hep "C" it was eradicated  . History of hepatitis C - successfuly treated medically 07/14/2015  . Kidney stones   . Liver failure ([place])    "I'm on liver transplant list @ [facility]" (11/07/2012)  . PONV (postoperative nausea and vomiting)   . Portal vein thrombosis   . Renal insufficiency   . Right ureteral stone 12/07/2011  . 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 Type II diabetes mellitus ([place])    "not since gastric bypass" (11/07/2012)    MEDICATIONS:  Prior to Admission medications   Medication Sig Start Date End Date Taking? Authorizing Provider  b complex vitamins tablet Take 1 tablet by mouth daily. Reported on 08/18/2015    Historical Provider, MD  buPROPion (WELLBUTRIN) 75 MG tablet Take 75 mg by mouth daily.  11/19/15   Historical Provider, MD  escitalopram (LEXAPRO) 20 MG tablet Take 20 mg by mouth at bedtime.  11/21/15   Historical Provider, MD  fentaNYL (DURAGESIC - DOSED MCG/HR) 25 MCG/HR patch Place 25 mcg onto the skin every 3 (three) days.  01/04/16   Historical Provider, MD  ferrous sulfate 325 (65 FE) MG tablet Take 1 tablet (325 mg total) by mouth daily with breakfast. Reported on 08/18/2015 01/14/16   Rogene Houston, MD  furosemide (LASIX) 40 MG tablet Take 2.5 tablets (100 mg total) by mouth daily. 11/14/15   Kathie Dike, MD  lactulose (CHRONULAC) 10 GM/15ML solution Take 45 mLs (30 g total) by mouth 3 (three) times daily. Patient taking differently: Take 30 g by mouth daily.  09/24/15   Rogene Houston, MD  LANTUS SOLOSTAR 100 UNIT/ML Solostar Pen Inject 20 Units into the skin every evening. Reported on 08/18/2015 08/27/14   Historical Provider, MD  Multiple Vitamin (MULTIVITAMIN WITH MINERALS) TABS tablet Take 1 tablet by mouth daily. Reported on 08/18/2015     Historical Provider, MD  Oxycodone HCl 10 MG TABS Take 10 mg by mouth 3 (three) times daily as needed (for pain).  11/20/15   Historical Provider, MD  pantoprazole (PROTONIX) 40 MG tablet Take 1 tablet (40 mg total) by mouth 2 (two) times daily before a meal. 09/24/15   Rogene Houston, MD  potassium chloride (K-DUR) 10 MEQ tablet Take 20 mEq by mouth daily. 12/23/15   Historical Provider, MD  promethazine (PHENERGAN) 25 MG suppository Place 0.5 suppositories (12.5 mg total) rectally every 6 (six) hours as needed for nausea or vomiting. 01/14/16   Rogene Houston, MD  rifaximin (XIFAXAN) 550 MG TABS tablet Take 1 tablet (550 mg total) by mouth 2 (two) times daily. 09/24/15   Rogene Houston, MD  spironolactone (ALDACTONE) 100 MG tablet Take 3 tablets (300 mg total) by mouth daily. 11/16/15   Butch Penny, NP  traZODone (DESYREL) 50 MG tablet Take 1 tablet (50 mg total) by mouth at bedtime. 11/11/15   Thurnell Lose, MD    ALLERGIES:  Allergies  Allergen Reactions  . Ancef [Cefazolin Sodium] Other (See Comments)    "Blisters in mouth and vagina" Tolerates Penicillins  . Gadobenate Itching    Nausea and vomiting reported by patient post injection of multihance.  Pt states itching and throat redness after receiving MRI contrast.  . Iodinated Diagnostic Agents Rash    Patient reports itching, nausea and vomiting.  . Tape Rash    Blisters skin.    SOCIAL HISTORY:  Social History  Substance Use Topics  . Smoking status: Former Smoker    Packs/day: 0.50    Years: 5.00    Types: Cigarettes    Quit date: 05/02/1991  . Smokeless tobacco: Never Used  . Alcohol use No    FAMILY HISTORY: Family History  Problem Relation Age of Onset  . Dementia Mother   . Heart disease Father   . Liver disease Father   . Hypertension Father   . Diabetes Father   . Dementia Father   . Hypertension Brother   . Other Son     Cervical dystonia    EXAM: BP 130/57 (BP Location: Left Arm)   Pulse 86    Temp 98.3 F (36.8 C) (Temporal)   Resp 18   Ht 5\' 6"  (1.676 m)   Wt 150 lb (68 kg)   SpO2 99%   BMI 24.21 kg/m  CONSTITUTIONAL: Alert and oriented and responds appropriately to questions. Chronically ill appearing, laughing with her friend at bedside when I entered the room, afebrile and nontoxic HEAD: Normocephalic EYES: Conjunctivae clear, PERRL, EOMI ENT: normal nose; no rhinorrhea; moist mucous membranes NECK: Supple, no meningismus, no nuchal rigidity, no LAD  CARD: RRR; S1 and S2 appreciated; no murmurs, no clicks, no rubs, no gallops RESP: Normal chest excursion without splinting or tachypnea; breath sounds clear and equal bilaterally; no wheezes, no rhonchi,  no rales, no hypoxia or respiratory distress, speaking full sentences ABD/GI: Normal bowel sounds; distended with fluid wave without tympany; soft, non-tender, no rebound, no guarding, no peritoneal signs, no hepatosplenomegaly RECTAL:  Normal rectal tone, no gross blood or melena, small external nonthrombosed and nonbleeding hemorrhoids appreciated, nontender rectal exam, no fecal impaction, small amount of soft brown stool in the rectal vault BACK:  The back appears normal and is non-tender to palpation, there is no CVA tenderness EXT: Normal ROM in all joints; non-tender to palpation; no edema; normal capillary refill; no cyanosis, no calf tenderness or swelling    SKIN: Normal color for age and race; warm; no rash NEURO: Moves all extremities equally, sensation to light touch intact diffusely, cranial nerves II through XII intact, normal speech PSYCH: The patient's mood and manner are appropriate. Grooming and personal hygiene are appropriate.  MEDICAL DECISION MAKING: Patient here with constipation. Nothing to suggest bowel obstruction, perforation, fecal impaction, GI bleed, SBP at this time. Abdominal exam is benign other than distention and ascites which is chronic for her. No fevers, vomiting and she is passing gas. I do  not think she needs an enema at this time she has no fecal impaction. Have discussed with her that I recommend MiraLAX twice a day, Colace twice a day and increase fiber and water intake. She has a GI doctor to follow up with, Dr. Laural Golden and has an appointment scheduled in the next few weeks. I suspect that her constipation is secondary to her chronic narcotic use because of chronic pain. I do not feel she needs emergent imaging, labs at this time. She is very well appearing, laughing in the room. Discussed return precautions at length with patient. She is comfortable with this plan. We'll have her continue her lactulose.   At this time, I do not feel there is any life-threatening condition present. I have reviewed and discussed all results (EKG, imaging, lab, urine as appropriate) and exam findings with patient/family. I have reviewed nursing notes and appropriate previous records.  I feel the patient is safe to be discharged home without further emergent workup and can continue workup as an outpatient as needed. Discussed usual and customary return precautions. Patient/family verbalize understanding and are comfortable with this plan.  Outpatient follow-up has been provided. All questions have been answered.   I personally performed the services described in this documentation, which was scribed in my presence. The recorded information has been reviewed and is accurate.     Reader, DO 02/07/16 Beggs, DO 02/07/16 0040

## 2016-02-06 NOTE — ED Triage Notes (Addendum)
Pt reports not having a bowel movement x 3 weeks. Pt states she takes lactulose but is not having any relief. Pt does states she has passed some yellowish (saw dust color per patient) liquid. Pt states she has taken laxatives and enemas without relief. Pt reports decreased appetite but has been able to drink. Pt states she was on the liver transplant list but was taken off in February due to having 3 blood clots.

## 2016-02-07 MED ORDER — DOCUSATE SODIUM 100 MG PO CAPS: 100.0000 mg | ORAL_CAPSULE | Freq: Two times a day (BID) | ORAL | 1 refills | Status: DC

## 2016-02-07 MED ORDER — POLYETHYLENE GLYCOL 3350 17 G PO PACK: 17.0000 g | PACK | Freq: Two times a day (BID) | ORAL | 1 refills | Status: DC

## 2016-02-07 NOTE — ED Notes (Signed)
Pt left without discharge papers. 

## 2016-02-07 NOTE — Discharge Instructions (Signed)
Continue your lactulose as prescribed. If you cannot increase your fiber intake with your food in your diet you may take Benefiber or Metamucil once a day.

## 2016-02-13 DIAGNOSIS — G8929 Other chronic pain: Secondary | ICD-10-CM | POA: Diagnosis not present

## 2016-02-13 DIAGNOSIS — M25511 Pain in right shoulder: Secondary | ICD-10-CM | POA: Diagnosis not present

## 2016-02-13 DIAGNOSIS — M542 Cervicalgia: Secondary | ICD-10-CM | POA: Diagnosis not present

## 2016-02-15 DIAGNOSIS — M542 Cervicalgia: Secondary | ICD-10-CM | POA: Diagnosis not present

## 2016-02-15 DIAGNOSIS — G8929 Other chronic pain: Secondary | ICD-10-CM | POA: Diagnosis not present

## 2016-02-23 DIAGNOSIS — G8929 Other chronic pain: Secondary | ICD-10-CM | POA: Diagnosis not present

## 2016-02-23 DIAGNOSIS — M50222 Other cervical disc displacement at C5-C6 level: Secondary | ICD-10-CM | POA: Diagnosis not present

## 2016-02-23 DIAGNOSIS — M542 Cervicalgia: Secondary | ICD-10-CM | POA: Diagnosis not present

## 2016-03-08 ENCOUNTER — Encounter: Payer: Self-pay | Admitting: Internal Medicine

## 2016-03-11 ENCOUNTER — Encounter (HOSPITAL_COMMUNITY): Payer: Self-pay

## 2016-03-11 ENCOUNTER — Ambulatory Visit (HOSPITAL_COMMUNITY)
Admission: RE | Admit: 2016-03-11 | Discharge: 2016-03-11 | Disposition: A | Discharge status: Home / Self Care | Payer: Medicare Other | Source: Ambulatory Visit | Attending: Internal Medicine | Admitting: Internal Medicine

## 2016-03-11 ENCOUNTER — Encounter: Payer: Self-pay | Admitting: Internal Medicine

## 2016-03-11 DIAGNOSIS — K7460 Unspecified cirrhosis of liver: Secondary | ICD-10-CM | POA: Insufficient documentation

## 2016-03-11 DIAGNOSIS — M542 Cervicalgia: Secondary | ICD-10-CM | POA: Diagnosis not present

## 2016-03-11 DIAGNOSIS — R188 Other ascites: Secondary | ICD-10-CM | POA: Diagnosis not present

## 2016-03-11 DIAGNOSIS — M5010 Cervical disc disorder with radiculopathy, unspecified cervical region: Secondary | ICD-10-CM | POA: Diagnosis not present

## 2016-03-11 DIAGNOSIS — M5412 Radiculopathy, cervical region: Secondary | ICD-10-CM | POA: Diagnosis not present

## 2016-03-11 DIAGNOSIS — M5033 Other cervical disc degeneration, cervicothoracic region: Secondary | ICD-10-CM | POA: Diagnosis not present

## 2016-03-11 LAB — BODY FLUID CELL COUNT WITH DIFFERENTIAL
Eos, Fluid: 0 %
Lymphs, Fluid: 43 %
Monocyte-Macrophage-Serous Fluid: 42 % — ABNORMAL LOW (ref 50–90)
Neutrophil Count, Fluid: 15 % (ref 0–25)
OTHER CELLS FL: 0 %
Total Nucleated Cell Count, Fluid: 126 cu mm (ref 0–1000)

## 2016-03-11 LAB — GRAM STAIN: GRAM STAIN: NONE SEEN

## 2016-03-11 NOTE — Progress Notes (Signed)
Paracentesis complete no signs of distress. 4L amber colored ascites removed.

## 2016-03-11 NOTE — Procedures (Signed)
PreOperative Dx: Cirrhosis, ascites Postoperative Dx: Cirrhosis, ascites Procedure:   US guided paracentesis Radiologist:  Thornton Papas Anesthesia:  10 ml of1% lidocaine Specimen:  4 L of light amber colored ascitic fluid EBL:   < 1 ml Complications: None

## 2016-03-14 LAB — PATHOLOGIST SMEAR REVIEW

## 2016-03-16 LAB — CULTURE, BODY FLUID W GRAM STAIN -BOTTLE: Culture: NO GROWTH

## 2016-03-16 LAB — CULTURE, BODY FLUID-BOTTLE

## 2016-03-17 ENCOUNTER — Other Ambulatory Visit (INDEPENDENT_AMBULATORY_CARE_PROVIDER_SITE_OTHER): Payer: Self-pay | Admitting: *Deleted

## 2016-03-17 ENCOUNTER — Ambulatory Visit (INDEPENDENT_AMBULATORY_CARE_PROVIDER_SITE_OTHER): Payer: Medicare Other | Admitting: Internal Medicine

## 2016-03-17 ENCOUNTER — Telehealth (INDEPENDENT_AMBULATORY_CARE_PROVIDER_SITE_OTHER): Payer: Self-pay | Admitting: Internal Medicine

## 2016-03-17 DIAGNOSIS — R188 Other ascites: Secondary | ICD-10-CM

## 2016-03-17 NOTE — Telephone Encounter (Signed)
Patient would like a new paracentesis standing order with Albumin.  She stated that they told her last time that her standing order had expired.  [phone]

## 2016-03-17 NOTE — Telephone Encounter (Signed)
A standing order for 6 months has been done.

## 2016-03-21 ENCOUNTER — Ambulatory Visit (INDEPENDENT_AMBULATORY_CARE_PROVIDER_SITE_OTHER): Payer: Medicare Other | Admitting: Internal Medicine

## 2016-03-21 ENCOUNTER — Ambulatory Visit (HOSPITAL_COMMUNITY)
Admission: RE | Admit: 2016-03-21 | Discharge: 2016-03-21 | Disposition: A | Discharge status: Home / Self Care | Payer: Medicare Other | Source: Ambulatory Visit | Attending: Internal Medicine | Admitting: Internal Medicine

## 2016-03-21 DIAGNOSIS — K7460 Unspecified cirrhosis of liver: Secondary | ICD-10-CM | POA: Diagnosis not present

## 2016-03-21 DIAGNOSIS — R188 Other ascites: Secondary | ICD-10-CM | POA: Insufficient documentation

## 2016-03-21 DIAGNOSIS — K7581 Nonalcoholic steatohepatitis (NASH): Secondary | ICD-10-CM | POA: Diagnosis not present

## 2016-03-21 LAB — BODY FLUID CELL COUNT WITH DIFFERENTIAL
EOS FL: 1 %
Lymphs, Fluid: 46 %
MONOCYTE-MACROPHAGE-SEROUS FLUID: 27 % — AB (ref 50–90)
Neutrophil Count, Fluid: 26 % — ABNORMAL HIGH (ref 0–25)
Other Cells, Fluid: 0 %
WBC FLUID: 110 uL (ref 0–1000)

## 2016-03-21 LAB — GRAM STAIN

## 2016-03-21 MED ORDER — ALBUMIN HUMAN 25 % IV SOLN
INTRAVENOUS | Status: AC
  Administered 2016-03-21: 50 g via INTRAVENOUS
  Filled 2016-03-21: qty 200

## 2016-03-21 MED ORDER — ALBUMIN HUMAN 25 % IV SOLN
50.0000 g | Freq: Once | INTRAVENOUS | Status: AC
  Administered 2016-03-21: 50 g via INTRAVENOUS

## 2016-03-21 NOTE — Procedures (Signed)
PreOperative Dx: Cirrhosis, ascites Postoperative Dx: Cirrhosis, ascites Procedure:   US guided paracentesis Radiologist:  Thornton Papas Anesthesia:  10 ml of1% lidocaine Specimen:  5.6 L of amber colored ascitic fluid EBL:   < 1 ml Complications: None

## 2016-03-21 NOTE — Progress Notes (Signed)
Paracentesis complete no signs of distress. 5.6 amber colored ascites removed.

## 2016-03-22 ENCOUNTER — Encounter (INDEPENDENT_AMBULATORY_CARE_PROVIDER_SITE_OTHER): Payer: Self-pay | Admitting: Internal Medicine

## 2016-03-22 ENCOUNTER — Ambulatory Visit (INDEPENDENT_AMBULATORY_CARE_PROVIDER_SITE_OTHER): Payer: Medicare Other | Admitting: Internal Medicine

## 2016-03-22 VITALS — BP 100/60 | HR 70 | Temp 98.0°F | Resp 18 | Ht 66.0 in | Wt 149.0 lb

## 2016-03-22 DIAGNOSIS — K7460 Unspecified cirrhosis of liver: Secondary | ICD-10-CM | POA: Diagnosis not present

## 2016-03-22 DIAGNOSIS — R188 Other ascites: Secondary | ICD-10-CM

## 2016-03-22 DIAGNOSIS — K7682 Hepatic encephalopathy: Secondary | ICD-10-CM

## 2016-03-22 DIAGNOSIS — K219 Gastro-esophageal reflux disease without esophagitis: Secondary | ICD-10-CM

## 2016-03-22 DIAGNOSIS — K7290 Hepatic failure, unspecified without coma: Secondary | ICD-10-CM | POA: Diagnosis not present

## 2016-03-22 LAB — PATHOLOGIST SMEAR REVIEW

## 2016-03-22 MED ORDER — LEVOFLOXACIN 250 MG PO TABS: 250.0000 mg | ORAL_TABLET | ORAL | 2 refills | Status: DC

## 2016-03-22 MED ORDER — PANTOPRAZOLE SODIUM 40 MG PO TBEC: 40.0000 mg | DELAYED_RELEASE_TABLET | Freq: Every day | ORAL | 1 refills | Status: DC

## 2016-03-22 NOTE — Patient Instructions (Signed)
Call when abdomen is distended again.

## 2016-03-22 NOTE — Progress Notes (Signed)
Presenting complaint;  Follow-up for decompensated chronic liver disease.  Subjective:  [name] is 56 year old Caucasian female who is in for scheduled visit accompanied by her son [name]. She was last seen on 01/14/2016. Since her last visit she has undergone abdominal paracenteses on 3 occasions more recently yesterday with removal of 5.6 L of fluid. She is very excited about her upcoming visit at [facility]. 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 She has as a meat thrombosis she continues to complain of extreme egg and tiredness. She states she spends 70% of her time in bed. She gets exhausted with minimal activity such as taking a bath. Since her last visit she has developed neck pain. She had steroid injections which has helped. She says gabapentin is also helping with her neck and shoulder pain. She takes oxycodone normal than 2 or 3 times a week. She was given prescription for Phenergan when she was hospitalized in December 2017 but she has not taken this medication. She is having at least 3 bowel movements per day. She denies melena or rectal bleeding. She states her heartburn is well controlled with PPI. She is not having any breakthrough symptoms if she skips a second dose. She is not sure when Levaquin was discontinued.     Current Medications: Outpatient Encounter Prescriptions as of 03/22/2016  Medication Sig  . b complex vitamins tablet Take 1 tablet by mouth daily. Reported on 08/18/2015  . buPROPion (WELLBUTRIN) 75 MG tablet Take 75 mg by mouth daily.   Marland Kitchen docusate sodium (COLACE) 100 MG capsule Take 1 capsule (100 mg total) by mouth every 12 (twelve) hours.  . ferrous sulfate 325 (65 FE) MG tablet Take 1 tablet (325 mg total) by mouth daily with breakfast. Reported on 08/18/2015  . furosemide (LASIX) 40 MG tablet Take 2.5 tablets (100 mg total) by mouth daily.  Marland Kitchen gabapentin (NEURONTIN) 100 MG capsule Take 100 mg by mouth at bedtime.   Marland Kitchen lactulose (CHRONULAC)  10 GM/15ML solution Take 45 mLs (30 g total) by mouth 3 (three) times daily. (Patient taking differently: Take 30 g by mouth daily. )  . LANTUS SOLOSTAR 100 UNIT/ML Solostar Pen Inject 20 Units into the skin every evening. Reported on 08/18/2015  . Multiple Vitamin (MULTIVITAMIN WITH MINERALS) TABS tablet Take 1 tablet by mouth daily. Reported on 08/18/2015  . Oxycodone HCl 10 MG TABS Take 10 mg by mouth 3 (three) times daily as needed (for pain).   . pantoprazole (PROTONIX) 40 MG tablet Take 1 tablet (40 mg total) by mouth 2 (two) times daily before a meal.  . potassium chloride (K-DUR) 10 MEQ tablet Take 20 mEq by mouth daily.  . promethazine (PHENERGAN) 25 MG suppository Place 0.5 suppositories (12.5 mg total) rectally every 6 (six) hours as needed for nausea or vomiting.  . rifaximin (XIFAXAN) 550 MG TABS tablet Take 1 tablet (550 mg total) by mouth 2 (two) times daily.  Marland Kitchen spironolactone (ALDACTONE) 100 MG tablet Take 3 tablets (300 mg total) by mouth daily.  . traZODone (DESYREL) 50 MG tablet Take 1 tablet (50 mg total) by mouth at bedtime.  . [DISCONTINUED] escitalopram (LEXAPRO) 20 MG tablet Take 20 mg by mouth at bedtime.   . [DISCONTINUED] fentaNYL (DURAGESIC - DOSED MCG/HR) 25 MCG/HR patch Place 25 mcg onto the skin every 3 (three) days.   . [DISCONTINUED] polyethylene glycol (MIRALAX) packet Take 17 g by mouth 2 (two) times daily. (Patient not taking: Reported on 03/22/2016)   No  facility-administered encounter medications on file as of 03/22/2016.      Objective: Blood pressure 100/60, pulse 70, temperature 98 F (36.7 C), temperature source Oral, resp. rate 18, height 5\' 6"  (1.676 m), weight 149 lb (67.6 kg). Patient is alert and in no acute distress.  Asterixis absent.  Conjunctiva is pink. Sclera is nonicteric Oropharyngeal mucosa is normal. No neck masses or thyromegaly noted. Cardiac exam with regular rhythm normal S1 and S2. Faint systolic murmur at left sternal border.   Lungs are clear to auscultation. Abdomen is full but soft with easily palpable large spleen. It is mildly tender but no rub noted. Liver is indistinct. Shifting dullness is present.  No LE edema or clubbing noted. He has generalized muscle wasting.  Labs/studies Results: Cell count on ascitic fluid from 03/21/2016 was 110 with 36% neutrophils. Gram stain and cultures are negative.   H&H 11 and 33.8 on 01/03/2016  Assessment:  #1. Advanced cirrhosis secondary to chronic hepatitis C which was successfully treated over 12 years ago with SVR. She was listed for liver transplant at [facility] but taken off the list because of SMA thrombosis unresponsive to warfarin that she took for 18 months. She was tried on Eliquis but developed acute GI bleed(portal enteropathy) and anticoagulant was discontinued.  Now she's been referred to [facility] for combined small bowel and liver transplant. She has appointment later this month.  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  She has had 2 episodes of SBP occurring while on Cipro and she was therefore switched to Levaquin but she needs to go back on.  #2. Chronic GERD. She is doing well with double dose PPI. She is ready for dose reduction.  Plan:  Promethazine discontinued from blister medications. She should not take this medication. Decrease pantoprazole to 40 mg by mouth every morning. Continue lactulose and Xifaxan at current dose. Patient will call when ascites becomes tense. Will hold off doing blood work since she will be getting extensive studies at [facility]. Office visit in 2 months.

## 2016-03-25 DIAGNOSIS — G8929 Other chronic pain: Secondary | ICD-10-CM | POA: Diagnosis not present

## 2016-03-25 DIAGNOSIS — M25511 Pain in right shoulder: Secondary | ICD-10-CM | POA: Diagnosis not present

## 2016-03-25 DIAGNOSIS — M5020 Other cervical disc displacement, unspecified cervical region: Secondary | ICD-10-CM | POA: Diagnosis not present

## 2016-03-26 LAB — CULTURE, BODY FLUID-BOTTLE: CULTURE: NO GROWTH

## 2016-04-04 DIAGNOSIS — Z01818 Encounter for other preprocedural examination: Secondary | ICD-10-CM | POA: Diagnosis not present

## 2016-04-04 DIAGNOSIS — K652 Spontaneous bacterial peritonitis: Secondary | ICD-10-CM | POA: Diagnosis not present

## 2016-04-04 DIAGNOSIS — Z7682 Awaiting organ transplant status: Secondary | ICD-10-CM | POA: Diagnosis not present

## 2016-04-04 DIAGNOSIS — B182 Chronic viral hepatitis C: Secondary | ICD-10-CM | POA: Diagnosis not present

## 2016-04-04 DIAGNOSIS — Z8619 Personal history of other infectious and parasitic diseases: Secondary | ICD-10-CM | POA: Diagnosis not present

## 2016-04-04 DIAGNOSIS — E43 Unspecified severe protein-calorie malnutrition: Secondary | ICD-10-CM | POA: Diagnosis not present

## 2016-04-04 DIAGNOSIS — K7460 Unspecified cirrhosis of liver: Secondary | ICD-10-CM | POA: Diagnosis not present

## 2016-04-04 DIAGNOSIS — D696 Thrombocytopenia, unspecified: Secondary | ICD-10-CM | POA: Diagnosis not present

## 2016-04-04 DIAGNOSIS — I81 Portal vein thrombosis: Secondary | ICD-10-CM | POA: Diagnosis not present

## 2016-04-04 DIAGNOSIS — K769 Liver disease, unspecified: Secondary | ICD-10-CM | POA: Diagnosis not present

## 2016-04-04 DIAGNOSIS — I517 Cardiomegaly: Secondary | ICD-10-CM | POA: Diagnosis not present

## 2016-04-05 DIAGNOSIS — J90 Pleural effusion, not elsewhere classified: Secondary | ICD-10-CM | POA: Diagnosis not present

## 2016-04-05 DIAGNOSIS — K769 Liver disease, unspecified: Secondary | ICD-10-CM | POA: Diagnosis not present

## 2016-04-05 DIAGNOSIS — Z7682 Awaiting organ transplant status: Secondary | ICD-10-CM | POA: Diagnosis not present

## 2016-04-05 DIAGNOSIS — Z01818 Encounter for other preprocedural examination: Secondary | ICD-10-CM | POA: Diagnosis not present

## 2016-04-05 DIAGNOSIS — I81 Portal vein thrombosis: Secondary | ICD-10-CM | POA: Diagnosis not present

## 2016-04-05 DIAGNOSIS — R918 Other nonspecific abnormal finding of lung field: Secondary | ICD-10-CM | POA: Diagnosis not present

## 2016-04-05 DIAGNOSIS — B182 Chronic viral hepatitis C: Secondary | ICD-10-CM | POA: Diagnosis not present

## 2016-04-05 DIAGNOSIS — K7460 Unspecified cirrhosis of liver: Secondary | ICD-10-CM | POA: Diagnosis not present

## 2016-04-06 DIAGNOSIS — Z01810 Encounter for preprocedural cardiovascular examination: Secondary | ICD-10-CM | POA: Diagnosis not present

## 2016-04-06 DIAGNOSIS — I10 Essential (primary) hypertension: Secondary | ICD-10-CM | POA: Diagnosis not present

## 2016-04-06 DIAGNOSIS — Z01818 Encounter for other preprocedural examination: Secondary | ICD-10-CM | POA: Diagnosis not present

## 2016-04-06 DIAGNOSIS — Z008 Encounter for other general examination: Secondary | ICD-10-CM | POA: Diagnosis not present

## 2016-04-06 DIAGNOSIS — Z7682 Awaiting organ transplant status: Secondary | ICD-10-CM | POA: Diagnosis not present

## 2016-04-06 DIAGNOSIS — B182 Chronic viral hepatitis C: Secondary | ICD-10-CM | POA: Diagnosis not present

## 2016-04-06 DIAGNOSIS — E785 Hyperlipidemia, unspecified: Secondary | ICD-10-CM | POA: Diagnosis not present

## 2016-04-06 DIAGNOSIS — K769 Liver disease, unspecified: Secondary | ICD-10-CM | POA: Diagnosis not present

## 2016-04-06 DIAGNOSIS — E119 Type 2 diabetes mellitus without complications: Secondary | ICD-10-CM | POA: Diagnosis not present

## 2016-04-06 DIAGNOSIS — I81 Portal vein thrombosis: Secondary | ICD-10-CM | POA: Diagnosis not present

## 2016-04-06 DIAGNOSIS — K7460 Unspecified cirrhosis of liver: Secondary | ICD-10-CM | POA: Diagnosis not present

## 2016-04-06 DIAGNOSIS — K766 Portal hypertension: Secondary | ICD-10-CM | POA: Diagnosis not present

## 2016-04-08 ENCOUNTER — Ambulatory Visit (HOSPITAL_COMMUNITY)
Admission: RE | Admit: 2016-04-08 | Discharge: 2016-04-08 | Disposition: A | Discharge status: Home / Self Care | Payer: Medicare Other | Source: Ambulatory Visit | Attending: Internal Medicine | Admitting: Internal Medicine

## 2016-04-08 ENCOUNTER — Other Ambulatory Visit (HOSPITAL_COMMUNITY): Payer: Medicare Other

## 2016-04-08 DIAGNOSIS — K7460 Unspecified cirrhosis of liver: Secondary | ICD-10-CM | POA: Insufficient documentation

## 2016-04-08 DIAGNOSIS — R188 Other ascites: Secondary | ICD-10-CM | POA: Diagnosis not present

## 2016-04-08 LAB — GRAM STAIN

## 2016-04-08 LAB — BODY FLUID CELL COUNT WITH DIFFERENTIAL
Eos, Fluid: 0 %
LYMPHS FL: 29 %
MONOCYTE-MACROPHAGE-SEROUS FLUID: 58 % (ref 50–90)
Neutrophil Count, Fluid: 13 % (ref 0–25)
OTHER CELLS FL: 0 %
Total Nucleated Cell Count, Fluid: 142 cu mm (ref 0–1000)

## 2016-04-08 MED ORDER — SODIUM CHLORIDE 0.9% FLUSH
INTRAVENOUS | Status: AC
  Administered 2016-04-08: 10 mL
  Filled 2016-04-08: qty 50

## 2016-04-08 MED ORDER — ALBUMIN HUMAN 25 % IV SOLN
INTRAVENOUS | Status: AC
  Filled 2016-04-08: qty 50

## 2016-04-08 MED ORDER — ALBUMIN HUMAN 25 % IV SOLN
INTRAVENOUS | Status: AC
  Filled 2016-04-08: qty 150

## 2016-04-08 MED ORDER — ALBUMIN HUMAN 25 % IV SOLN
50.0000 g | Freq: Once | INTRAVENOUS | Status: AC
  Administered 2016-04-08: 50 g via INTRAVENOUS

## 2016-04-08 NOTE — Procedures (Signed)
PreOperative Dx: Nonalcoholic cirrhosis, ascites Postoperative Dx: Nonalcoholic cirrhosis, ascites Procedure:   US guided paracentesis Radiologist:  Thornton Papas Anesthesia:  10 ml of1% lidocaine Specimen:  3.7 L of yellow ascitic fluid EBL:   < 1 ml Complications: None

## 2016-04-09 DIAGNOSIS — M5020 Other cervical disc displacement, unspecified cervical region: Secondary | ICD-10-CM | POA: Diagnosis not present

## 2016-04-09 DIAGNOSIS — M5489 Other dorsalgia: Secondary | ICD-10-CM | POA: Diagnosis not present

## 2016-04-11 LAB — PATHOLOGIST SMEAR REVIEW

## 2016-04-13 LAB — CULTURE, BODY FLUID W GRAM STAIN -BOTTLE: Culture: NO GROWTH

## 2016-04-13 LAB — CULTURE, BODY FLUID-BOTTLE

## 2016-04-15 ENCOUNTER — Other Ambulatory Visit (INDEPENDENT_AMBULATORY_CARE_PROVIDER_SITE_OTHER): Payer: Self-pay | Admitting: Internal Medicine

## 2016-04-15 ENCOUNTER — Ambulatory Visit (HOSPITAL_COMMUNITY)
Admission: RE | Admit: 2016-04-15 | Discharge: 2016-04-15 | Disposition: A | Discharge status: Home / Self Care | Payer: Medicare Other | Source: Ambulatory Visit | Attending: Internal Medicine | Admitting: Internal Medicine

## 2016-04-15 ENCOUNTER — Encounter (HOSPITAL_COMMUNITY): Payer: Self-pay

## 2016-04-15 DIAGNOSIS — K7469 Other cirrhosis of liver: Secondary | ICD-10-CM

## 2016-04-15 DIAGNOSIS — R188 Other ascites: Secondary | ICD-10-CM

## 2016-04-15 LAB — GRAM STAIN: Gram Stain: NONE SEEN

## 2016-04-15 MED ORDER — ALBUMIN HUMAN 25 % IV SOLN
INTRAVENOUS | Status: AC
  Administered 2016-04-15: 50 g
  Filled 2016-04-15: qty 200

## 2016-04-15 MED ORDER — ALBUMIN HUMAN 5 % IV SOLN: 50.0000 g | Freq: Once | INTRAVENOUS | Status: DC

## 2016-04-15 NOTE — Progress Notes (Signed)
Pt tolerated procedure well. Pt had 5.7L of clear orange fluid drained. NAD

## 2016-04-20 LAB — CULTURE, BODY FLUID W GRAM STAIN -BOTTLE

## 2016-04-20 LAB — CULTURE, BODY FLUID-BOTTLE: CULTURE: NO GROWTH

## 2016-04-25 ENCOUNTER — Ambulatory Visit (HOSPITAL_COMMUNITY)
Admission: RE | Admit: 2016-04-25 | Discharge: 2016-04-25 | Disposition: A | Discharge status: Home / Self Care | Payer: Medicare Other | Source: Ambulatory Visit | Attending: Internal Medicine | Admitting: Internal Medicine

## 2016-04-25 DIAGNOSIS — R188 Other ascites: Secondary | ICD-10-CM

## 2016-04-25 DIAGNOSIS — K7460 Unspecified cirrhosis of liver: Secondary | ICD-10-CM | POA: Insufficient documentation

## 2016-04-25 LAB — BODY FLUID CELL COUNT WITH DIFFERENTIAL
EOS FL: 0 %
Lymphs, Fluid: 41 %
Monocyte-Macrophage-Serous Fluid: 33 % — ABNORMAL LOW (ref 50–90)
Neutrophil Count, Fluid: 26 % — ABNORMAL HIGH (ref 0–25)
OTHER CELLS FL: REACTIVE %
Total Nucleated Cell Count, Fluid: 85 cu mm (ref 0–1000)

## 2016-04-25 LAB — GRAM STAIN: GRAM STAIN: NONE SEEN

## 2016-04-25 MED ORDER — ALBUMIN HUMAN 25 % IV SOLN
50.0000 g | Freq: Once | INTRAVENOUS | Status: AC
  Administered 2016-04-25: 50 g via INTRAVENOUS

## 2016-04-25 MED ORDER — ALBUMIN HUMAN 25 % IV SOLN
INTRAVENOUS | Status: AC
  Administered 2016-04-25: 50 g via INTRAVENOUS
  Filled 2016-04-25: qty 200

## 2016-04-25 NOTE — Progress Notes (Signed)
Paracentesis complete no signs of distress. 5.3L yellow colored ascites removed.

## 2016-04-25 NOTE — Procedures (Signed)
PreOperative Dx: Cirrhosis, ascites Postoperative Dx: Cirrhosis, ascites Procedure:   US guided paracentesis Radiologist:  Thornton Papas Anesthesia:  10 ml of1% lidocaine Specimen:  5.3 L of yellow ascitic fluid EBL:   < 1 ml Complications: None

## 2016-04-26 DIAGNOSIS — M5020 Other cervical disc displacement, unspecified cervical region: Secondary | ICD-10-CM | POA: Diagnosis not present

## 2016-04-26 DIAGNOSIS — M47812 Spondylosis without myelopathy or radiculopathy, cervical region: Secondary | ICD-10-CM | POA: Diagnosis not present

## 2016-04-26 LAB — PATHOLOGIST SMEAR REVIEW

## 2016-04-27 DIAGNOSIS — K5730 Diverticulosis of large intestine without perforation or abscess without bleeding: Secondary | ICD-10-CM | POA: Diagnosis not present

## 2016-04-27 DIAGNOSIS — I81 Portal vein thrombosis: Secondary | ICD-10-CM | POA: Diagnosis not present

## 2016-04-30 LAB — CULTURE, BODY FLUID-BOTTLE: CULTURE: NO GROWTH

## 2016-04-30 LAB — CULTURE, BODY FLUID W GRAM STAIN -BOTTLE

## 2016-05-04 DIAGNOSIS — Z9889 Other specified postprocedural states: Secondary | ICD-10-CM | POA: Diagnosis not present

## 2016-05-04 DIAGNOSIS — I81 Portal vein thrombosis: Secondary | ICD-10-CM | POA: Diagnosis not present

## 2016-05-04 DIAGNOSIS — Z9884 Bariatric surgery status: Secondary | ICD-10-CM | POA: Diagnosis not present

## 2016-05-05 ENCOUNTER — Ambulatory Visit (HOSPITAL_COMMUNITY)
Admission: RE | Admit: 2016-05-05 | Discharge: 2016-05-05 | Disposition: A | Discharge status: Home / Self Care | Payer: Medicare Other | Source: Ambulatory Visit | Attending: Internal Medicine | Admitting: Internal Medicine

## 2016-05-05 DIAGNOSIS — R188 Other ascites: Secondary | ICD-10-CM | POA: Diagnosis not present

## 2016-05-05 LAB — GRAM STAIN: Gram Stain: NONE SEEN

## 2016-05-05 LAB — BODY FLUID CELL COUNT WITH DIFFERENTIAL
EOS FL: 1 %
LYMPHS FL: 56 %
MONOCYTE-MACROPHAGE-SEROUS FLUID: 6 % — AB (ref 50–90)
Neutrophil Count, Fluid: 37 % — ABNORMAL HIGH (ref 0–25)
Total Nucleated Cell Count, Fluid: 114 cu mm (ref 0–1000)

## 2016-05-05 MED ORDER — ALBUMIN HUMAN 25 % IV SOLN
INTRAVENOUS | Status: AC
  Administered 2016-05-05: 50 g via INTRAVENOUS
  Filled 2016-05-05: qty 200

## 2016-05-05 MED ORDER — ALBUMIN HUMAN 25 % IV SOLN
50.0000 g | Freq: Once | INTRAVENOUS | Status: AC
  Administered 2016-05-05: 50 g via INTRAVENOUS

## 2016-05-05 NOTE — Progress Notes (Signed)
Paracentesis complete no signs of distress. 6.4L yellow colored ascites remroved.

## 2016-05-06 LAB — PATHOLOGIST SMEAR REVIEW

## 2016-05-07 ENCOUNTER — Emergency Department (HOSPITAL_COMMUNITY)
Admission: EM | Admit: 2016-05-07 | Discharge: 2016-05-07 | Disposition: A | Discharge status: Home / Self Care | Payer: Medicare Other | Attending: Emergency Medicine | Admitting: Emergency Medicine

## 2016-05-07 ENCOUNTER — Encounter (HOSPITAL_COMMUNITY): Payer: Self-pay | Admitting: *Deleted

## 2016-05-07 DIAGNOSIS — E119 Type 2 diabetes mellitus without complications: Secondary | ICD-10-CM | POA: Diagnosis not present

## 2016-05-07 DIAGNOSIS — Z79899 Other long term (current) drug therapy: Secondary | ICD-10-CM | POA: Diagnosis not present

## 2016-05-07 DIAGNOSIS — R109 Unspecified abdominal pain: Secondary | ICD-10-CM

## 2016-05-07 DIAGNOSIS — Z794 Long term (current) use of insulin: Secondary | ICD-10-CM | POA: Insufficient documentation

## 2016-05-07 DIAGNOSIS — Z87891 Personal history of nicotine dependence: Secondary | ICD-10-CM | POA: Diagnosis not present

## 2016-05-07 LAB — CBC WITH DIFFERENTIAL/PLATELET
BASOS ABS: 0 10*3/uL (ref 0.0–0.1)
BASOS PCT: 0 %
EOS PCT: 2 %
Eosinophils Absolute: 0.1 10*3/uL (ref 0.0–0.7)
HEMATOCRIT: 31.3 % — AB (ref 36.0–46.0)
Hemoglobin: 10.4 g/dL — ABNORMAL LOW (ref 12.0–15.0)
LYMPHS PCT: 11 %
Lymphs Abs: 0.6 10*3/uL — ABNORMAL LOW (ref 0.7–4.0)
MCH: 27.2 pg (ref 26.0–34.0)
MCHC: 33.2 g/dL (ref 30.0–36.0)
MCV: 81.9 fL (ref 78.0–100.0)
MONO ABS: 0.3 10*3/uL (ref 0.1–1.0)
Monocytes Relative: 6 %
NEUTROS ABS: 4.7 10*3/uL (ref 1.7–7.7)
Neutrophils Relative %: 82 %
PLATELETS: 63 10*3/uL — AB (ref 150–400)
RBC: 3.82 MIL/uL — ABNORMAL LOW (ref 3.87–5.11)
RDW: 16.1 % — AB (ref 11.5–15.5)
WBC: 5.8 10*3/uL (ref 4.0–10.5)

## 2016-05-07 LAB — BASIC METABOLIC PANEL
ANION GAP: 8 (ref 5–15)
BUN: 14 mg/dL (ref 6–20)
CALCIUM: 8.9 mg/dL (ref 8.9–10.3)
CO2: 27 mmol/L (ref 22–32)
Chloride: 98 mmol/L — ABNORMAL LOW (ref 101–111)
Creatinine, Ser: 0.91 mg/dL (ref 0.44–1.00)
GFR calc Af Amer: >60 mL/min (ref >60)
GLUCOSE: 222 mg/dL — AB (ref 65–99)
Potassium: 3.1 mmol/L — ABNORMAL LOW (ref 3.5–5.1)
Sodium: 133 mmol/L — ABNORMAL LOW (ref 135–145)

## 2016-05-07 LAB — SEDIMENTATION RATE: Sed Rate: 14 mm/hr (ref 0–22)

## 2016-05-07 LAB — PROTIME-INR
INR: 1.4
Prothrombin Time: 17.3 seconds — ABNORMAL HIGH (ref 11.4–15.2)

## 2016-05-07 MED ORDER — HYDROMORPHONE HCL 2 MG/ML IJ SOLN
2.0000 mg | Freq: Once | INTRAMUSCULAR | Status: DC
  Filled 2016-05-07: qty 1

## 2016-05-07 MED ORDER — HYDROMORPHONE HCL 1 MG/ML IJ SOLN
1.0000 mg | Freq: Once | INTRAMUSCULAR | Status: AC
Start: 2016-05-07 — End: 2016-05-07
  Administered 2016-05-07: 1 mg via INTRAMUSCULAR
  Filled 2016-05-07: qty 1

## 2016-05-07 MED ORDER — HYDROMORPHONE HCL 2 MG/ML IJ SOLN
2.0000 mg | Freq: Once | INTRAMUSCULAR | Status: AC
  Administered 2016-05-07: 2 mg via INTRAMUSCULAR

## 2016-05-07 MED ORDER — ONDANSETRON 8 MG PO TBDP
8.0000 mg | ORAL_TABLET | Freq: Once | ORAL | Status: AC
  Administered 2016-05-07: 8 mg via ORAL
  Filled 2016-05-07: qty 1

## 2016-05-07 NOTE — ED Triage Notes (Signed)
Pt had paracentesis done on Thursday here at [facility]. Pt woke up this morning with a wet spot on her bed,  swelling and burning pain to right side of abdomen. Pt's right side of abdomen is swollen all the way to her back, no swelling on left side of abdomen.

## 2016-05-07 NOTE — ED Notes (Signed)
ED Provider at bedside. 

## 2016-05-07 NOTE — Discharge Instructions (Signed)
Dr. Vernard Gambles, from interventional radiology, in [place] will call you after 8:00 in the morning, tomorrow to arrange to have a repeat paracentesis to hopefully decrease the drainage.  Return here, or go to [facility], if needed, for problems.

## 2016-05-07 NOTE — ED Notes (Signed)
Pt ambulatory to bathroom and back to room 

## 2016-05-08 ENCOUNTER — Other Ambulatory Visit: Payer: Self-pay | Admitting: Radiology

## 2016-05-08 ENCOUNTER — Ambulatory Visit (HOSPITAL_COMMUNITY)
Admission: RE | Admit: 2016-05-08 | Discharge: 2016-05-08 | Disposition: A | Discharge status: Home / Self Care | Payer: Medicare Other | Source: Ambulatory Visit | Attending: Radiology | Admitting: Radiology

## 2016-05-08 DIAGNOSIS — R188 Other ascites: Secondary | ICD-10-CM | POA: Diagnosis not present

## 2016-05-08 MED ORDER — LIDOCAINE HCL 1 % IJ SOLN
INTRAMUSCULAR | Status: AC
  Filled 2016-05-08: qty 20

## 2016-05-09 NOTE — ED Provider Notes (Signed)
[facility] DEPT Provider Note   CSN: [id number] Arrival date & time: 05/07/16  1545     History   Chief Complaint Chief Complaint  Patient presents with  . Abdominal Pain    abdominal swelling, paracentesis thursday    HPI [name] is a 56 y.o. female.  Patient presents for evaluation of swelling right abdomen and flank following recent paracentesis procedure.  The catheter site placement, continues to leak yellowish fluid.  She denies fever, chills, cough, shortness of breath, chest pain, weakness or dizziness.  She gets frequent paracentesis.  She is on a transplant list, for liver and bowel.  There are no other known modifying factors.  HPI  Past Medical History:  Diagnosis Date  . Anemia   . Anxiety   . Ascites   . 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 Complication of anesthesia   . Depression   . Diabetes mellitus type 2, controlled, without complications ([place])   . Enteropathy 07/31/2015   Portal hypertensive enteropathy per capsule study, with stigmata of bleeding.  . Esophageal varices ([place])   . Gastric ulcer 07/15/2015   per EGD; no stigmata of bleeding  . GI bleed 07/17/2015  . Gram-negative bacteremia 12/08/2011  . MOQHUTML(465.0)    "monthly" (11/07/2012)  . Heart murmur   . Hepatitis C    Hx: of Hep "C" it was eradicated  . History of hepatitis C - successfuly treated medically 07/14/2015  . Kidney stones   . Liver failure ([place])    "I'm on liver transplant list @ [facility]" (11/07/2012)  . PONV (postoperative nausea and vomiting)   . Portal vein thrombosis   . Renal insufficiency   . Right ureteral stone 12/07/2011  . 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 Type II diabetes mellitus ([place])    "not since gastric bypass" (11/07/2012)    Patient Active Problem List   Diagnosis Date Noted  . Pancytopenia ([place]) 11/13/2015  . Cirrhosis of liver with ascites ([place])   . Abdominal pain 11/10/2015  . Alcoholic cirrhosis of liver with ascites ([place])   .  Enteropathy 07/31/2015  . Anemia, blood loss   . Depression with anxiety 07/30/2015  . Esophageal varices in cirrhosis ([place]) 07/30/2015  . Gastritis determined by endoscopy 07/30/2015  . Hypotension 07/30/2015  . Hyponatremia 07/29/2015  . Hypokalemia 07/29/2015  . GI bleeding 07/29/2015  . Gastric ulcer 07/29/2015  . Leukopenia 07/29/2015  . AKI (acute kidney injury) ([place]) 07/29/2015  . Diabetes mellitus type 2, controlled, without complications ([place]) 35/46/5681  . GI bleed 07/17/2015  . Palliative care encounter   . Goals of care, counseling/discussion   . DNR (do not resuscitate) discussion   . 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 Melena 07/14/2015  . Hepatic cirrhosis due to chronic hepatitis C infection ([place]) 07/14/2015  . History of hepatitis C - successfuly treated medically 07/14/2015  . Portal vein thrombosis 05/13/2014  . Other malaise and fatigue 08/05/2013  . Hypoxemia 08/05/2013  . Abdominal pain, right upper quadrant 05/29/2013  . SBP (spontaneous bacterial peritonitis) ([place]) 05/28/2013  . Distal radius fracture 10/25/2012  . Metatarsal bone fracture 10/25/2012  . Insomnia 09/25/2012  . Encephalopathy, hepatic ([place]) 06/07/2012  . Hyperglycemia 06/06/2012  . LUQ abdominal pain 06/05/2012  . Ascites 05/14/2012  . Gram-negative bacteremia 12/08/2011  . Pyelonephritis 12/07/2011  . Hydronephrosis of right kidney 12/07/2011  . Right ureteral stone 12/07/2011  . Thrombocytopenia ([place]) 12/07/2011  . Cirrhosis of liver ([place]) 05/02/2011  .  Acute blood loss anemia 05/02/2011    Past Surgical History:  Procedure Laterality Date  . ABDOMINAL HYSTERECTOMY  2003  . CESAREAN SECTION  1995  . CHOLECYSTECTOMY  1987  . COLONOSCOPY    . COLONOSCOPY  10/21/2011   Procedure: COLONOSCOPY;  Surgeon: Rogene [place], MD;  [facility];  Service: Endoscopy;  Laterality: N/A;  245   . CYSTOSCOPY W/ RETROGRADES  12/08/2011   Procedure: CYSTOSCOPY WITH  RETROGRADE PYELOGRAM;  Surgeon: Marissa Nestle, MD;  Location: [facility] OR[facility];  Service: Urology;  Laterality: Right;  . CYSTOSCOPY WITH STENT PLACEMENT  12/08/2011   Procedure: CYSTOSCOPY WITH STENT PLACEMENT;  Surgeon: Marissa Nestle, MD;  Location: [facility];  Service: Urology;  Laterality: Right;  . DILATION AND CURETTAGE OF UTERUS    . ESOPHAGOGASTRODUODENOSCOPY N/A 02/19/2014   Procedure: ESOPHAGOGASTRODUODENOSCOPY (EGD);  Surgeon: Rogene [place], MD;  [facility];  Service: Endoscopy;  Laterality: N/A;  100  . ESOPHAGOGASTRODUODENOSCOPY N/A 07/15/2015   Procedure: ESOPHAGOGASTRODUODENOSCOPY (EGD);  Surgeon: Rogene [place], MD;  [facility];  Service: Endoscopy;  Laterality: N/A;  . ESOPHAGOGASTRODUODENOSCOPY N/A 07/29/2015   Procedure: ESOPHAGOGASTRODUODENOSCOPY (EGD);  Surgeon: Rogene [place], MD;  [facility];  Service: Endoscopy;  Laterality: N/A;  . GASTRIC BYPASS  ~ 1995  . HERNIA REPAIR  04/14/11   "one in my bellybutton" (11/07/2012)  . INGUINAL HERNIA REPAIR Right    "maybe 2" (11/07/2012)  . KNEE ARTHROSCOPY Right   . LITHOTRIPSY     "several times" (11/07/2012)  . OPEN REDUCTION INTERNAL FIXATION (ORIF) DISTAL RADIAL FRACTURE Right 11/07/2012   Procedure: OPEN REDUCTION INTERNAL FIXATION (ORIF) RIGHT DISTAL RADIAL FRACTURE;  Surgeon: [name], MD;  Location: [facility];  Service: Orthopedics;  Laterality: Right;  . ORIF DISTAL RADIUS FRACTURE Right 11/07/2012  . UPPER GASTROINTESTINAL ENDOSCOPY      OB History    Gravida Para Term Preterm AB Living   1 1 1          SAB TAB Ectopic Multiple Live Births                   Home Medications    Prior to Admission medications   Medication Sig Start Date End Date Taking? Authorizing Provider  b complex vitamins tablet Take 1 tablet by mouth daily. Reported on 08/18/2015   Yes Historical Provider, MD  docusate sodium (COLACE) 100 MG capsule Take 1 capsule (100 mg total) by mouth every 12 (twelve)  hours. 02/07/16  Yes Kristen N Ward, DO  escitalopram (LEXAPRO) 20 MG tablet Take 20 mg by mouth daily.   Yes Historical Provider, MD  ferrous sulfate 325 (65 FE) MG tablet Take 1 tablet (325 mg total) by mouth daily with breakfast. Reported on 08/18/2015 01/14/16  Yes Rogene [place], MD  furosemide (LASIX) 40 MG tablet Take 2.5 tablets (100 mg total) by mouth daily. 11/14/15  Yes Kathie Dike, MD  gabapentin (NEURONTIN) 100 MG capsule Take 100 mg by mouth at bedtime.  03/01/16  Yes Historical Provider, MD  lactulose (CHRONULAC) 10 GM/15ML solution Take 45 mLs (30 g total) by mouth 3 (three) times daily. Patient taking differently: Take 30 g by mouth daily as needed for mild constipation or moderate constipation.  09/24/15  Yes Rogene [place], MD  LANTUS SOLOSTAR 100 UNIT/ML Solostar Pen Inject 20 Units into the skin every evening. Reported on 08/18/2015 08/27/14  Yes Historical Provider, MD  levofloxacin (LEVAQUIN) 250  MG tablet Take 1 tablet (250 mg total) by mouth every other day. 03/22/16  Yes Rogene [place], MD  Multiple Vitamin (MULTIVITAMIN WITH MINERALS) TABS tablet Take 1 tablet by mouth daily. Reported on 08/18/2015   Yes Historical Provider, MD  Oxycodone HCl 10 MG TABS Take 10 mg by mouth 3 (three) times daily as needed (for pain).  11/20/15  Yes Historical Provider, MD  pantoprazole (PROTONIX) 40 MG tablet Take 1 tablet (40 mg total) by mouth daily before breakfast. 03/22/16  Yes Rogene [place], MD  potassium chloride (K-DUR) 10 MEQ tablet Take 20 mEq by mouth daily. 12/23/15  Yes Historical Provider, MD  rifaximin (XIFAXAN) 550 MG TABS tablet Take 1 tablet (550 mg total) by mouth 2 (two) times daily. 09/24/15  Yes Rogene [place], MD  spironolactone (ALDACTONE) 100 MG tablet Take 3 tablets (300 mg total) by mouth daily. 11/16/15  Yes Butch Penny, NP  traZODone (DESYREL) 50 MG tablet Take 1 tablet (50 mg total) by mouth at bedtime. 11/11/15  Yes Thurnell Lose, MD    Family  History Family History  Problem Relation Age of Onset  . Dementia Mother   . Heart disease Father   . Liver disease Father   . Hypertension Father   . Diabetes Father   . Dementia Father   . Hypertension Brother   . Other Son     Cervical dystonia    Social History Social History  Substance Use Topics  . Smoking status: Former Smoker    Packs/day: 0.50    Years: 5.00    Types: Cigarettes    Quit date: 05/02/1991  . Smokeless tobacco: Never Used  . Alcohol use No     Allergies   Ancef [cefazolin sodium]; Gadobenate; Iodinated diagnostic agents; and Tape   Review of Systems Review of Systems  All other systems reviewed and are negative.    Physical Exam Updated Vital Signs BP 101/62   Pulse 70   Temp 98.3 F (36.8 C) (Oral)   Resp 17   Ht 5\' 6"  (1.676 m)   Wt 145 lb (65.8 kg)   SpO2 97%   BMI 23.40 kg/m   Physical Exam  Constitutional: She is oriented to person, place, and time. She appears well-developed and well-nourished. No distress.  HENT:  Head: Normocephalic and atraumatic.  Eyes: Conjunctivae and EOM are normal. Pupils are equal, round, and reactive to light.  Neck: Normal range of motion and phonation normal. Neck supple.  Cardiovascular: Normal rate.   Pulmonary/Chest: Effort normal. She exhibits no tenderness.  Abdominal: Soft. She exhibits distension. There is tenderness. There is no guarding.  Tender and swollen from midline to right flank, boggy swelling consistent with subcutaneous edema.  Tenderness is mild.  Clear drainage from paracentesis site, without bleeding.  Musculoskeletal: Normal range of motion.  Neurological: She is alert and oriented to person, place, and time. She exhibits normal muscle tone.  Skin: Skin is warm and dry.  Psychiatric: She has a normal mood and affect. Her behavior is normal. Judgment and thought content normal.  Nursing note and vitals reviewed.    ED Treatments / Results  Labs (all labs ordered are  listed, but only abnormal results are displayed) Labs Reviewed  BASIC METABOLIC PANEL - Abnormal; Notable for the following:       Result Value   Sodium 133 (*)    Potassium 3.1 (*)    Chloride 98 (*)    Glucose, Bld 222 (*)  All other components within normal limits  CBC WITH DIFFERENTIAL/PLATELET - Abnormal; Notable for the following:    RBC 3.82 (*)    Hemoglobin 10.4 (*)    HCT 31.3 (*)    RDW 16.1 (*)    Platelets 63 (*)    Lymphs Abs 0.6 (*)    All other components within normal limits  PROTIME-INR - Abnormal; Notable for the following:    Prothrombin Time 17.3 (*)    All other components within normal limits  SEDIMENTATION RATE    EKG  EKG Interpretation None       Radiology US Paracentesis  Result Date: 05/08/2016 CLINICAL DATA:  Leaking from skin entry site of recent paracentesis performed 05/05/2016, at which time 6.4 L were removed. EXAM: ULTRASOUND GUIDED PARACENTESIS TECHNIQUE: Survey ultrasound of the abdomen was performed. Edema was noted in the subcutaneous tissues of the body wall in the region of the continued leak. A small amount of residual/ recurrent abdominal ascites was identified with the patient placed in left posterior oblique position right, there is no for the leaking from the abdominal wall prior skin entry site. Residual ascitic fluid in the left abdomen was localized, And an appropriate skin entry site in the left inferior abdomen was selected. Skin site was marked, prepped with chlorhexidine, and draped in usual sterile fashion, and infiltrated locally with 1% lidocaine. A 5 French multisidehole Yueh sheath needle was advanced into the peritoneal space until fluid could be aspirated. The sheath was advanced and the needle removed. 35 mL of clear yellowascites were aspirated. The patient tolerated the procedure well. COMPLICATIONS: COMPLICATIONS none IMPRESSION: 1. Small amount of residual/recurrent abdominal ascites, with Technically successful  ultrasound guided paracentesis, removing 30 mL of ascites. 2. The leaking site from previous paracentesis was prepped with chlorhexidine and closed with a single Steri-Strip. The patient was instructed to lay in left posterior oblique position for 24-48 hours to maximize chance of permanent closure of this site. Electronically Signed   By: Lucrezia Europe M.D.   On: 05/08/2016 15:09    Procedures Procedures (including critical care time)  Medications Ordered in ED Medications  HYDROmorphone (DILAUDID) injection 1 mg (1 mg Intramuscular Given 05/07/16 1933)  ondansetron (ZOFRAN-ODT) disintegrating tablet 8 mg (8 mg Oral Given 05/07/16 1933)  HYDROmorphone (DILAUDID) injection 2 mg (2 mg Intramuscular Given 05/07/16 2112)     Initial Impression / Assessment and Plan / ED Course  I have reviewed the triage vital signs and the nursing notes.  Pertinent labs & imaging results that were available during my care of the patient were reviewed by me and considered in my medical decision making (see chart for details).    Medications  HYDROmorphone (DILAUDID) injection 1 mg (1 mg Intramuscular Given 05/07/16 1933)  ondansetron (ZOFRAN-ODT) disintegrating tablet 8 mg (8 mg Oral Given 05/07/16 1933)  HYDROmorphone (DILAUDID) injection 2 mg (2 mg Intramuscular Given 05/07/16 2112)    No data found.  Case discussed with interventional radiology will patient in the emergency permit.  He agreed to see patient, following day for further assessment and treatment.   Final Clinical Impressions(s) / ED Diagnoses   Final diagnoses:  Abdominal pain, unspecified abdominal location   Persistent leak post paracentesis procedure, without evidence for spontaneous bacterial peritonitis.  Nursing Notes Reviewed/ Care Coordinated Applicable Imaging Reviewed Interpretation of Laboratory Data incorporated into ED treatment  The patient appears reasonably screened and/or stabilized for discharge and I doubt any other  medical condition or other [facility] requiring  further screening, evaluation, or treatment in the ED at this time prior to discharge.  Plan: Home Medications-continue usual; Home Treatments-rest, fluids; return here if the recommended treatment, does not improve the symptoms; Recommended follow up-PCP, as needed.  Follow-up with interventional radiology, next day for further assessment and treatment   New Prescriptions Discharge Medication List as of 05/07/2016  9:05 PM       Daleen Bo, MD 05/10/16 [phone]

## 2016-05-10 LAB — CULTURE, BODY FLUID-BOTTLE

## 2016-05-10 LAB — CULTURE, BODY FLUID W GRAM STAIN -BOTTLE: Culture: NO GROWTH

## 2016-05-12 DIAGNOSIS — I81 Portal vein thrombosis: Secondary | ICD-10-CM | POA: Diagnosis not present

## 2016-05-12 DIAGNOSIS — D649 Anemia, unspecified: Secondary | ICD-10-CM | POA: Diagnosis not present

## 2016-05-12 DIAGNOSIS — D696 Thrombocytopenia, unspecified: Secondary | ICD-10-CM | POA: Diagnosis not present

## 2016-05-16 ENCOUNTER — Telehealth: Payer: Self-pay

## 2016-05-16 NOTE — Telephone Encounter (Signed)
Pt called to schedule sleep study on 8/17. Pt stated she doesn't want to schedule bc she can't afford it at this time.

## 2016-05-19 ENCOUNTER — Ambulatory Visit (HOSPITAL_COMMUNITY)
Admission: RE | Admit: 2016-05-19 | Discharge: 2016-05-19 | Disposition: A | Discharge status: Home / Self Care | Payer: Medicare Other | Source: Ambulatory Visit | Attending: Internal Medicine | Admitting: Internal Medicine

## 2016-05-19 ENCOUNTER — Other Ambulatory Visit (INDEPENDENT_AMBULATORY_CARE_PROVIDER_SITE_OTHER): Payer: Self-pay | Admitting: *Deleted

## 2016-05-19 ENCOUNTER — Encounter (HOSPITAL_COMMUNITY): Payer: Self-pay

## 2016-05-19 DIAGNOSIS — R188 Other ascites: Secondary | ICD-10-CM | POA: Diagnosis not present

## 2016-05-19 LAB — BODY FLUID CELL COUNT WITH DIFFERENTIAL
Eos, Fluid: 0 %
LYMPHS FL: 18 %
Monocyte-Macrophage-Serous Fluid: 69 % (ref 50–90)
NEUTROPHIL FLUID: 13 % (ref 0–25)
Other Cells, Fluid: 0 %
WBC FLUID: 118 uL (ref 0–1000)

## 2016-05-19 LAB — GRAM STAIN: GRAM STAIN: NONE SEEN

## 2016-05-19 MED ORDER — ALBUMIN HUMAN 25 % IV SOLN
INTRAVENOUS | Status: AC
  Administered 2016-05-19: 50 g via INTRAVENOUS
  Filled 2016-05-19: qty 200

## 2016-05-19 MED ORDER — ALBUMIN HUMAN 25 % IV SOLN
50.0000 g | Freq: Once | INTRAVENOUS | Status: AC
  Administered 2016-05-19: 50 g via INTRAVENOUS

## 2016-05-19 NOTE — Procedures (Signed)
PreOperative Dx: Nonalcoholic cirrhosis, ascites Postoperative Dx: Nonalcoholic cirrhosis, ascites Procedure:   US guided paracentesis Radiologist:  Thornton Papas Anesthesia:  10 ml of1% lidocaine Specimen:  5.9 L of yellow ascitic fluid EBL:   < 1 ml Complications: None

## 2016-05-19 NOTE — Progress Notes (Signed)
Paracentesis complete no signs of distress. 5.9L yellow colored ascites removed.

## 2016-05-20 DIAGNOSIS — M47812 Spondylosis without myelopathy or radiculopathy, cervical region: Secondary | ICD-10-CM | POA: Diagnosis not present

## 2016-05-20 DIAGNOSIS — M5031 Other cervical disc degeneration,  high cervical region: Secondary | ICD-10-CM | POA: Diagnosis not present

## 2016-05-20 LAB — PATHOLOGIST SMEAR REVIEW

## 2016-05-22 ENCOUNTER — Emergency Department (HOSPITAL_COMMUNITY)
Admission: EM | Admit: 2016-05-22 | Discharge: 2016-05-23 | Disposition: A | Discharge status: Home / Self Care | Payer: Medicare Other | Attending: Emergency Medicine | Admitting: Emergency Medicine

## 2016-05-22 ENCOUNTER — Emergency Department (HOSPITAL_COMMUNITY): Payer: Medicare Other

## 2016-05-22 ENCOUNTER — Encounter (HOSPITAL_COMMUNITY): Payer: Self-pay

## 2016-05-22 DIAGNOSIS — D649 Anemia, unspecified: Secondary | ICD-10-CM | POA: Insufficient documentation

## 2016-05-22 DIAGNOSIS — E119 Type 2 diabetes mellitus without complications: Secondary | ICD-10-CM | POA: Diagnosis not present

## 2016-05-22 DIAGNOSIS — Z79899 Other long term (current) drug therapy: Secondary | ICD-10-CM | POA: Diagnosis not present

## 2016-05-22 DIAGNOSIS — K7460 Unspecified cirrhosis of liver: Secondary | ICD-10-CM | POA: Insufficient documentation

## 2016-05-22 DIAGNOSIS — Z794 Long term (current) use of insulin: Secondary | ICD-10-CM | POA: Insufficient documentation

## 2016-05-22 DIAGNOSIS — Z87891 Personal history of nicotine dependence: Secondary | ICD-10-CM | POA: Diagnosis not present

## 2016-05-22 DIAGNOSIS — M791 Myalgia: Secondary | ICD-10-CM | POA: Diagnosis present

## 2016-05-22 DIAGNOSIS — R188 Other ascites: Secondary | ICD-10-CM

## 2016-05-22 DIAGNOSIS — D696 Thrombocytopenia, unspecified: Secondary | ICD-10-CM | POA: Diagnosis not present

## 2016-05-22 LAB — AMMONIA: AMMONIA: 77 umol/L — AB (ref 9–35)

## 2016-05-22 LAB — COMPREHENSIVE METABOLIC PANEL
ALBUMIN: 3 g/dL — AB (ref 3.5–5.0)
ALK PHOS: 87 U/L (ref 38–126)
ALT: 27 U/L (ref 14–54)
ANION GAP: 5 (ref 5–15)
AST: 23 U/L (ref 15–41)
BILIRUBIN TOTAL: 0.6 mg/dL (ref 0.3–1.2)
BUN: 14 mg/dL (ref 6–20)
CALCIUM: 8.1 mg/dL — AB (ref 8.9–10.3)
CO2: 26 mmol/L (ref 22–32)
Chloride: 101 mmol/L (ref 101–111)
Creatinine, Ser: 0.89 mg/dL (ref 0.44–1.00)
GFR calc Af Amer: >60 mL/min (ref >60)
Glucose, Bld: 253 mg/dL — ABNORMAL HIGH (ref 65–99)
Potassium: 3.5 mmol/L (ref 3.5–5.1)
Sodium: 132 mmol/L — ABNORMAL LOW (ref 135–145)
TOTAL PROTEIN: 5.7 g/dL — AB (ref 6.5–8.1)

## 2016-05-22 LAB — CBC WITH DIFFERENTIAL/PLATELET
BASOS ABS: 0 10*3/uL (ref 0.0–0.1)
BASOS PCT: 1 %
EOS ABS: 0.1 10*3/uL (ref 0.0–0.7)
Eosinophils Relative: 2 %
HCT: 26.4 % — ABNORMAL LOW (ref 36.0–46.0)
HEMOGLOBIN: 8.6 g/dL — AB (ref 12.0–15.0)
Lymphocytes Relative: 9 %
Lymphs Abs: 0.4 10*3/uL — ABNORMAL LOW (ref 0.7–4.0)
MCH: 27.1 pg (ref 26.0–34.0)
MCHC: 32.6 g/dL (ref 30.0–36.0)
MCV: 83.3 fL (ref 78.0–100.0)
MONOS PCT: 7 %
Monocytes Absolute: 0.3 10*3/uL (ref 0.1–1.0)
NEUTROS ABS: 3.5 10*3/uL (ref 1.7–7.7)
NEUTROS PCT: 81 %
Platelets: 55 10*3/uL — ABNORMAL LOW (ref 150–400)
RBC: 3.17 MIL/uL — AB (ref 3.87–5.11)
RDW: 16.4 % — ABNORMAL HIGH (ref 11.5–15.5)
WBC: 4.3 10*3/uL (ref 4.0–10.5)

## 2016-05-22 LAB — PROTIME-INR
INR: 1.44
Prothrombin Time: 17.6 seconds — ABNORMAL HIGH (ref 11.4–15.2)

## 2016-05-22 LAB — LIPASE, BLOOD: Lipase: 46 U/L (ref 11–51)

## 2016-05-22 MED ORDER — TRAMADOL HCL 50 MG PO TABS
50.0000 mg | ORAL_TABLET | Freq: Once | ORAL | Status: AC
  Administered 2016-05-22: 50 mg via ORAL

## 2016-05-22 MED ORDER — POVIDONE-IODINE 10 % EX SOLN
CUTANEOUS | Status: AC
  Filled 2016-05-22: qty 236

## 2016-05-22 MED ORDER — LIDOCAINE HCL (PF) 2 % IJ SOLN
INTRAMUSCULAR | Status: AC
  Filled 2016-05-22: qty 10

## 2016-05-22 MED ORDER — SODIUM CHLORIDE 0.9 % IV SOLN
INTRAVENOUS | Status: DC
  Administered 2016-05-22: 21:00:00 via INTRAVENOUS

## 2016-05-22 MED ORDER — ONDANSETRON HCL 4 MG/2ML IJ SOLN
4.0000 mg | Freq: Once | INTRAMUSCULAR | Status: AC
  Administered 2016-05-22: 4 mg via INTRAVENOUS
  Filled 2016-05-22: qty 2

## 2016-05-22 MED ORDER — IBUPROFEN 400 MG PO TABS
400.0000 mg | ORAL_TABLET | Freq: Once | ORAL | Status: AC
  Administered 2016-05-22: 400 mg via ORAL
  Filled 2016-05-22: qty 1

## 2016-05-22 NOTE — ED Triage Notes (Signed)
Patient states that she has a paracentesis on Thursday and she has been in the bed ever since.  States that she has been aching all over and feels like she may have the flu.  States that Dr Laural Golden told her to come up here to be evaluated by the ER physician.  Patient denies fever, but states that she has had nausea, vomiting, and diarrhea.

## 2016-05-22 NOTE — ED Notes (Signed)
Patient transported to X-ray 

## 2016-05-22 NOTE — ED Notes (Signed)
Pt and family told valilnda, Network engineer that their nurse had a cold and they would prefer another nurse. Rip Harbour, RN charge nurse informed of this.

## 2016-05-22 NOTE — ED Provider Notes (Signed)
[facility] DEPT Provider Note   CSN: [id number] Arrival date & time: 05/22/16  1918   By signing my name below, I, Hilbert Odor, attest that this documentation has been prepared under the direction and in the presence of Dorie Rank, MD. Electronically Signed: Hilbert Odor, Scribe. 05/22/16. 8:35 PM. History   Chief Complaint Chief Complaint  Patient presents with  . Generalized Body Aches    The history is provided by the patient. No language interpreter was used.  HPI Comments: [name] is a 56 y.o. female who presents to the Emergency Department complaining of generalized body aches for the past 3 days. The patient states that she had a paracentesis 3 days and has not felt good since that time. She states that she feels like she has the flu. She reports nausea, vomiting, and diarrhea that began yesterday. She reports 6 episodes of diarrhea today. She denies any vomiting today but she reports dry heaves. She states that she talked to Dr. Laural Golden and was told to come to the ED for an evaluation. The patient states that she is on a list for a triple organ transplant. She denies blood in stool, cough, or fevers.  Past Medical History:  Diagnosis Date  . Anemia   . Anxiety   . Ascites   . 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 Complication of anesthesia   . Depression   . Diabetes mellitus type 2, controlled, without complications ([place])   . Enteropathy 07/31/2015   Portal hypertensive enteropathy per capsule study, with stigmata of bleeding.  . Esophageal varices ([place])   . Gastric ulcer 07/15/2015   per EGD; no stigmata of bleeding  . GI bleed 07/17/2015  . Gram-negative bacteremia 12/08/2011  . DUKGURKY(706.2)    "monthly" (11/07/2012)  . Heart murmur   . Hepatitis C    Hx: of Hep "C" it was eradicated  . History of hepatitis C - successfuly treated medically 07/14/2015  . Kidney stones   . Liver failure ([place])    "I'm on liver transplant list @ [facility]" (11/07/2012)  . PONV  (postoperative nausea and vomiting)   . Portal vein thrombosis   . Renal insufficiency   . Right ureteral stone 12/07/2011  . 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 Type II diabetes mellitus ([place])    "not since gastric bypass" (11/07/2012)    Patient Active Problem List   Diagnosis Date Noted  . Pancytopenia ([place]) 11/13/2015  . Cirrhosis of liver with ascites ([place])   . Abdominal pain 11/10/2015  . Alcoholic cirrhosis of liver with ascites ([place])   . Enteropathy 07/31/2015  . Anemia, blood loss   . Depression with anxiety 07/30/2015  . Esophageal varices in cirrhosis ([place]) 07/30/2015  . Gastritis determined by endoscopy 07/30/2015  . Hypotension 07/30/2015  . Hyponatremia 07/29/2015  . Hypokalemia 07/29/2015  . GI bleeding 07/29/2015  . Gastric ulcer 07/29/2015  . Leukopenia 07/29/2015  . AKI (acute kidney injury) ([place]) 07/29/2015  . Diabetes mellitus type 2, controlled, without complications ([place]) 37/62/8315  . GI bleed 07/17/2015  . Palliative care encounter   . Goals of care, counseling/discussion   . DNR (do not resuscitate) discussion   . 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 Melena 07/14/2015  . Hepatic cirrhosis due to chronic hepatitis C infection ([place]) 07/14/2015  . History of hepatitis C - successfuly treated medically 07/14/2015  . Portal vein thrombosis 05/13/2014  . Other malaise and fatigue 08/05/2013  . Hypoxemia 08/05/2013  .  Abdominal pain, right upper quadrant 05/29/2013  . SBP (spontaneous bacterial peritonitis) ([place]) 05/28/2013  . Distal radius fracture 10/25/2012  . Metatarsal bone fracture 10/25/2012  . Insomnia 09/25/2012  . Encephalopathy, hepatic ([place]) 06/07/2012  . Hyperglycemia 06/06/2012  . LUQ abdominal pain 06/05/2012  . Ascites 05/14/2012  . Gram-negative bacteremia 12/08/2011  . Pyelonephritis 12/07/2011  . Hydronephrosis of right kidney 12/07/2011  . Right ureteral stone 12/07/2011  . Thrombocytopenia ([place])  12/07/2011  . Cirrhosis of liver ([place]) 05/02/2011  . Acute blood loss anemia 05/02/2011    Past Surgical History:  Procedure Laterality Date  . ABDOMINAL HYSTERECTOMY  2003  . CESAREAN SECTION  1995  . CHOLECYSTECTOMY  1987  . COLONOSCOPY    . COLONOSCOPY  10/21/2011   Procedure: COLONOSCOPY;  Surgeon: Rogene Houston, MD;  Location[facility];  Service: Endoscopy;  Laterality: N/A;  245   . CYSTOSCOPY W/ RETROGRADES  12/08/2011   Procedure: CYSTOSCOPY WITH RETROGRADE PYELOGRAM;  Surgeon: Marissa Nestle, MD;  Location: [facility] ORS;  Service: Urology;  Laterality: Right;  . CYSTOSCOPY WITH STENT PLACEMENT  12/08/2011   Procedure: CYSTOSCOPY WITH STENT PLACEMENT;  Surgeon: Marissa Nestle, MD;  Location: [facility];  Service: Urology;  Laterality: Right;  . DILATION AND CURETTAGE OF UTERUS    . ESOPHAGOGASTRODUODENOSCOPY N/A 02/19/2014   Procedure: ESOPHAGOGASTRODUODENOSCOPY (EGD);  Surgeon: Rogene Houston, MD;  Location: [facility];  Service: Endoscopy;  Laterality: N/A;  100  . ESOPHAGOGASTRODUODENOSCOPY N/A 07/15/2015   Procedure: ESOPHAGOGASTRODUODENOSCOPY (EGD);  Surgeon: Rogene Houston, MD;  [facility];  Service: Endoscopy;  Laterality: N/A;  . ESOPHAGOGASTRODUODENOSCOPY N/A 07/29/2015   Procedure: ESOPHAGOGASTRODUODENOSCOPY (EGD);  Surgeon: Rogene Houston, MD;  [facility];  Service: Endoscopy;  Laterality: N/A;  . GASTRIC BYPASS  ~ 1995  . HERNIA REPAIR  04/14/11   "one in my bellybutton" (11/07/2012)  . INGUINAL HERNIA REPAIR Right    "maybe 2" (11/07/2012)  . KNEE ARTHROSCOPY Right   . LITHOTRIPSY     "several times" (11/07/2012)  . OPEN REDUCTION INTERNAL FIXATION (ORIF) DISTAL RADIAL FRACTURE Right 11/07/2012   Procedure: OPEN REDUCTION INTERNAL FIXATION (ORIF) RIGHT DISTAL RADIAL FRACTURE;  Surgeon: [name], MD;  Location: [facility];  Service: Orthopedics;  Laterality: Right;  . ORIF DISTAL RADIUS FRACTURE Right 11/07/2012  . UPPER  GASTROINTESTINAL ENDOSCOPY      OB History    Gravida Para Term Preterm AB Living   1 1 1          SAB TAB Ectopic Multiple Live Births                   Home Medications    Prior to Admission medications   Medication Sig Start Date End Date Taking? Authorizing Provider  b complex vitamins tablet Take 1 tablet by mouth daily. Reported on 08/18/2015   Yes Historical Provider, MD  docusate sodium (COLACE) 100 MG capsule Take 1 capsule (100 mg total) by mouth every 12 (twelve) hours. 02/07/16  Yes Kristen N Ward, DO  escitalopram (LEXAPRO) 20 MG tablet Take 20 mg by mouth daily.   Yes Historical Provider, MD  ferrous sulfate 325 (65 FE) MG tablet Take 1 tablet (325 mg total) by mouth daily with breakfast. Reported on 08/18/2015 01/14/16  Yes Rogene Houston, MD  furosemide (LASIX) 40 MG tablet Take 2.5 tablets (100 mg total) by mouth daily. 11/14/15  Yes Kathie Dike, MD  gabapentin (NEURONTIN) 100 MG  capsule Take 100 mg by mouth at bedtime.  03/01/16  Yes Historical Provider, MD  lactulose (CHRONULAC) 10 GM/15ML solution Take 45 mLs (30 g total) by mouth 3 (three) times daily. Patient taking differently: Take 30 g by mouth daily as needed for mild constipation or moderate constipation.  09/24/15  Yes Rogene Houston, MD  LANTUS SOLOSTAR 100 UNIT/ML Solostar Pen Inject 20 Units into the skin every evening. Reported on 08/18/2015 08/27/14  Yes Historical Provider, MD  levofloxacin (LEVAQUIN) 250 MG tablet Take 1 tablet (250 mg total) by mouth every other day. 03/22/16  Yes Rogene Houston, MD  Multiple Vitamin (MULTIVITAMIN WITH MINERALS) TABS tablet Take 1 tablet by mouth daily. Reported on 08/18/2015   Yes Historical Provider, MD  Oxycodone HCl 10 MG TABS Take 10 mg by mouth 3 (three) times daily as needed (for pain).  11/20/15  Yes Historical Provider, MD  pantoprazole (PROTONIX) 40 MG tablet Take 1 tablet (40 mg total) by mouth daily before breakfast. 03/22/16  Yes Rogene Houston, MD  potassium  chloride (K-DUR) 10 MEQ tablet Take 20 mEq by mouth daily. 12/23/15  Yes Historical Provider, MD  rifaximin (XIFAXAN) 550 MG TABS tablet Take 1 tablet (550 mg total) by mouth 2 (two) times daily. 09/24/15  Yes Rogene Houston, MD  spironolactone (ALDACTONE) 100 MG tablet Take 3 tablets (300 mg total) by mouth daily. 11/16/15  Yes Butch Penny, NP  traZODone (DESYREL) 50 MG tablet Take 1 tablet (50 mg total) by mouth at bedtime. 11/11/15  Yes Thurnell Lose, MD    Family History Family History  Problem Relation Age of Onset  . Dementia Mother   . Heart disease Father   . Liver disease Father   . Hypertension Father   . Diabetes Father   . Dementia Father   . Hypertension Brother   . Other Son     Cervical dystonia    Social History Social History  Substance Use Topics  . Smoking status: Former Smoker    Packs/day: 0.50    Years: 5.00    Types: Cigarettes    Quit date: 05/02/1991  . Smokeless tobacco: Never Used  . Alcohol use No     Allergies   Ancef [cefazolin sodium]; Gadobenate; Iodinated diagnostic agents; and Tape   Review of Systems Review of Systems  All other systems reviewed and are negative.    Physical Exam Updated Vital Signs BP 99/73   Pulse 65   Temp 98.6 F (37 C) (Oral)   Resp 17   Ht 5\' 6"  (1.676 m)   Wt 71.2 kg   SpO2 97%   BMI 25.34 kg/m   Physical Exam  Constitutional: No distress.  HENT:  Head: Normocephalic and atraumatic.  Right Ear: External ear normal.  Left Ear: External ear normal.  Eyes: Conjunctivae are normal. Right eye exhibits no discharge. Left eye exhibits no discharge. No scleral icterus.  Neck: Neck supple. No tracheal deviation present.  Cardiovascular: Normal rate, regular rhythm and intact distal pulses.   Pulmonary/Chest: Effort normal and breath sounds normal. No stridor. No respiratory distress. She has no wheezes. She has no rales.  Abdominal: Soft. Bowel sounds are normal. She exhibits fluid wave and ascites.  She exhibits no distension. There is generalized tenderness. There is no rebound and no guarding.  Musculoskeletal: She exhibits no edema or tenderness.  Neurological: She is alert. She has normal strength. No cranial nerve deficit (no facial droop, extraocular movements intact, no  slurred speech) or sensory deficit. She exhibits normal muscle tone. She displays no seizure activity. Coordination normal.  Skin: Skin is warm and dry. No rash noted.  Psychiatric: She has a normal mood and affect.  Nursing note and vitals reviewed.    ED Treatments / Results  DIAGNOSTIC STUDIES: Oxygen Saturation is 97% on RA, normal by my interpretation.    COORDINATION OF CARE: 8:27 PM Discussed treatment plan with pt at bedside and pt agreed to plan. I will check the patient's labs and chest x-ray.  Labs (all labs ordered are listed, but only abnormal results are displayed) Labs Reviewed  COMPREHENSIVE METABOLIC PANEL - Abnormal; Notable for the following:       Result Value   Sodium 132 (*)    Glucose, Bld 253 (*)    Calcium 8.1 (*)    Total Protein 5.7 (*)    Albumin 3.0 (*)    All other components within normal limits  CBC WITH DIFFERENTIAL/PLATELET - Abnormal; Notable for the following:    RBC 3.17 (*)    Hemoglobin 8.6 (*)    HCT 26.4 (*)    RDW 16.4 (*)    Platelets 55 (*)    Lymphs Abs 0.4 (*)    All other components within normal limits  PROTIME-INR - Abnormal; Notable for the following:    Prothrombin Time 17.6 (*)    All other components within normal limits  AMMONIA - Abnormal; Notable for the following:    Ammonia 77 (*)    All other components within normal limits  BODY FLUID CULTURE  GRAM STAIN  LIPASE, BLOOD  URINALYSIS, ROUTINE W REFLEX MICROSCOPIC  BODY FLUID CELL COUNT WITH DIFFERENTIAL     Radiology Dg Chest 2 View  Result Date: 05/22/2016 CLINICAL DATA:  Weakness, cirrhosis EXAM: CHEST  2 VIEW COMPARISON:  01/03/2016 chest radiograph. FINDINGS: Low lung volumes.  Stable cardiomediastinal silhouette with top-normal heart size. No pneumothorax. No pleural effusion. Lungs appear clear, with no acute consolidative airspace disease and no pulmonary edema. IMPRESSION: No active cardiopulmonary disease. Electronically Signed   By: Ilona Sorrel M.D.   On: 05/22/2016 22:41    Procedures ABDOMINAL PARACENTESIS Date/Time: 05/22/2016 11:46 PM Performed by: Dorie Rank Authorized by: Dorie Rank  Consent: Verbal consent obtained. Risks and benefits: risks, benefits and alternatives were discussed Consent given by: patient Patient understanding: patient states understanding of the procedure being performed Patient identity confirmed: verbally with patient Time out: Immediately prior to procedure a "time out" was called to verify the correct patient, procedure, equipment, support staff and site/side marked as required. Preparation: Patient was prepped and draped in the usual sterile fashion. Local anesthesia used: yes Anesthesia: local infiltration  Anesthesia: Local anesthesia used: yes Local Anesthetic: lidocaine 1% without epinephrine Anesthetic total: 1 mL  Sedation: Patient sedated: no Patient tolerance: Patient tolerated the procedure well with no immediate complications Comments: 10 cc of yellow fluid removed with a 22 gauge needle.   Dermabond placed over puncture wound following procedure.  No blood loss.      Medications Ordered in ED Medications  0.9 %  sodium chloride infusion ( Intravenous Restarted 05/22/16 2216)  povidone-iodine (BETADINE) 10 % external solution (not administered)  lidocaine (XYLOCAINE) 2 % injection (not administered)  traMADol (ULTRAM) tablet 50 mg (not administered)  ondansetron (ZOFRAN) injection 4 mg (4 mg Intravenous Given 05/22/16 2115)  ibuprofen (ADVIL,MOTRIN) tablet 400 mg (400 mg Oral Given 05/22/16 2318)     Initial Impression / Assessment and Plan /  ED Course  I have reviewed the triage vital signs and the  nursing notes.  Pertinent labs & imaging results that were available during my care of the patient were reviewed by me and considered in my medical decision making (see chart for details).  Clinical Course as of May 23 2346  Sun May 22, 2016  2036 Reviewed recent paracentesis culture.  No growth from 3 days ago  [JK]  2345 Reviewed findings with Dr Laural Golden.  She has had SBP in the past.  Recommends 10 cc paracentesis here to assess for infection.  If cell count is reassuring, OK for dc then.  [JK]    Clinical Course User Index [JK] Dorie Rank, MD    Pt presents with generalized malaise.  Labs do not show any acute findings.  Paracentesis performed to assess for SBP.  Will turn over case to Dr Reather Converse pending fluid analysis.  Final Clinical Impressions(s) / ED Diagnoses   pending  I personally performed the services described in this documentation, which was scribed in my presence.  The recorded information has been reviewed and is accurate.    Dorie Rank, MD 05/22/16 [phone]

## 2016-05-23 LAB — GRAM STAIN

## 2016-05-23 LAB — BODY FLUID CELL COUNT WITH DIFFERENTIAL
Eos, Fluid: 0 %
LYMPHS FL: 26 %
Monocyte-Macrophage-Serous Fluid: 52 % (ref 50–90)
Neutrophil Count, Fluid: 22 % (ref 0–25)
Total Nucleated Cell Count, Fluid: 138 cu mm (ref 0–1000)

## 2016-05-23 LAB — URINALYSIS, ROUTINE W REFLEX MICROSCOPIC
BILIRUBIN URINE: NEGATIVE
Glucose, UA: 150 mg/dL — AB
Hgb urine dipstick: NEGATIVE
Ketones, ur: NEGATIVE mg/dL
LEUKOCYTES UA: NEGATIVE
Nitrite: NEGATIVE
PH: 5 (ref 5.0–8.0)
PROTEIN: NEGATIVE mg/dL
Specific Gravity, Urine: 1.019 (ref 1.005–1.030)

## 2016-05-23 NOTE — Discharge Instructions (Signed)
Follow up with your GI doctor this week.  If you were given medicines take as directed.  If you are on coumadin or contraceptives realize their levels and effectiveness is altered by many different medicines.  If you have any reaction (rash, tongues swelling, other) to the medicines stop taking and see a physician.    If your blood pressure was elevated in the ER make sure you follow up for management with a primary doctor or return for chest pain, shortness of breath or stroke symptoms.  Please follow up as directed and return to the ER or see a physician for new or worsening symptoms.  Thank you. Vitals:   05/23/16 0000 05/23/16 0030 05/23/16 0100 05/23/16 0130  BP: 125/63 119/73 115/61 113/62  Pulse: 70 83 75 70  Resp:    16  Temp:      TempSrc:      SpO2: 100% 91% 95% 96%  Weight:      Height:

## 2016-05-23 NOTE — ED Provider Notes (Signed)
Patient's care signed out to follow-up cell count differential. Patient has significant liver disease and has outpatient follow-up. Patient presented with general malaise. Cell count not consistent with SBP. No fever. Patient stable for outpatient follow.  Labs Reviewed  COMPREHENSIVE METABOLIC PANEL - Abnormal; Notable for the following:       Result Value   Sodium 132 (*)    Glucose, Bld 253 (*)    Calcium 8.1 (*)    Total Protein 5.7 (*)    Albumin 3.0 (*)    All other components within normal limits  CBC WITH DIFFERENTIAL/PLATELET - Abnormal; Notable for the following:    RBC 3.17 (*)    Hemoglobin 8.6 (*)    HCT 26.4 (*)    RDW 16.4 (*)    Platelets 55 (*)    Lymphs Abs 0.4 (*)    All other components within normal limits  URINALYSIS, ROUTINE W REFLEX MICROSCOPIC - Abnormal; Notable for the following:    APPearance HAZY (*)    Glucose, UA 150 (*)    All other components within normal limits  PROTIME-INR - Abnormal; Notable for the following:    Prothrombin Time 17.6 (*)    All other components within normal limits  AMMONIA - Abnormal; Notable for the following:    Ammonia 77 (*)    All other components within normal limits  BODY FLUID CELL COUNT WITH DIFFERENTIAL - Abnormal; Notable for the following:    Color, Fluid YELLOW (*)    All other components within normal limits  GRAM STAIN  CULTURE, BODY FLUID-BOTTLE  LIPASE, BLOOD  PATHOLOGIST SMEAR REVIEW   Makel Mcmann Mariea Clonts, MD 05/23/16 [phone]

## 2016-05-24 ENCOUNTER — Other Ambulatory Visit (INDEPENDENT_AMBULATORY_CARE_PROVIDER_SITE_OTHER): Payer: Self-pay | Admitting: *Deleted

## 2016-05-24 ENCOUNTER — Ambulatory Visit (INDEPENDENT_AMBULATORY_CARE_PROVIDER_SITE_OTHER): Payer: Medicare Other | Admitting: Internal Medicine

## 2016-05-24 ENCOUNTER — Encounter (INDEPENDENT_AMBULATORY_CARE_PROVIDER_SITE_OTHER): Payer: Self-pay | Admitting: Internal Medicine

## 2016-05-24 VITALS — BP 110/62 | HR 68 | Temp 98.0°F | Resp 18 | Ht 66.0 in | Wt 165.2 lb

## 2016-05-24 DIAGNOSIS — M791 Myalgia, unspecified site: Secondary | ICD-10-CM

## 2016-05-24 DIAGNOSIS — K7290 Hepatic failure, unspecified without coma: Secondary | ICD-10-CM

## 2016-05-24 DIAGNOSIS — K7460 Unspecified cirrhosis of liver: Secondary | ICD-10-CM

## 2016-05-24 DIAGNOSIS — K7682 Hepatic encephalopathy: Secondary | ICD-10-CM

## 2016-05-24 DIAGNOSIS — R188 Other ascites: Secondary | ICD-10-CM

## 2016-05-24 DIAGNOSIS — D649 Anemia, unspecified: Secondary | ICD-10-CM | POA: Diagnosis not present

## 2016-05-24 LAB — CULTURE, BODY FLUID-BOTTLE: CULTURE: NO GROWTH

## 2016-05-24 LAB — PATHOLOGIST SMEAR REVIEW

## 2016-05-24 NOTE — Progress Notes (Signed)
Presenting complaint;  Aches and pains and abdominal distention.  Database and Subjective:  Patient is 56 year old Caucasian female who has decompensated cirrhosis secondary to chronic hepatitis C which was treated about 15 years ago with SVR. She had been listed for liver transplant at [facility] and more recently at [facility]. She developed portal vein thrombosis not responding to warfarin she took for over 18 months. She was subsequently given Apixaban and developed GI bleed secondary to portal enteropathy. Therefore anticoagulant was discontinued. Her disease also has been complicated by ascites and spontaneous bacterial peritonitis as well as hepatic encephalopathy. She was delisted because of portal vein thrombosis. Patient was subsequently referred to [facility] for consideration of liver and small bowel transplant. She has undergone extensive evaluation and she is waiting for cardiology visit after which she might be listed for multiorgan transplant.  Patient was last seen in the office on 03/22/2016 when she weighed 149 pounds. He called me 2 days ago stating that she was feeling miserable. She is afraid that she had an infection. She was therefore advised to go to emergency room. She was evaluated by Dr. Dorie Rank and underwent reevaluation including diagnostic abdominal tap and cell count was very low. She was discharged to home. She continues to feel poorly. She has no energy whatsoever. She complains of generalized aches and pains. She has not been febrile. She also did not experience sore throat. She had LVAP on 05/19/2016 with removal of 5.9 L of fluid and now her abdomen is distended again. She has poor appetite but denies nausea vomiting melena or rectal bleeding. She has multiple stools and she is on lactulose. He denies vaginal discharge or dysuria. She remains on low-dose Levaquin for SBP prophylaxis. She has an appointment to see cardiologist at [facility] in one week.   Current  Medications: Outpatient Encounter Prescriptions as of 05/24/2016  Medication Sig  . b complex vitamins tablet Take 1 tablet by mouth daily. Reported on 08/18/2015  . docusate sodium (COLACE) 100 MG capsule Take 1 capsule (100 mg total) by mouth every 12 (twelve) hours.  Marland Kitchen escitalopram (LEXAPRO) 20 MG tablet Take 20 mg by mouth daily.  . ferrous sulfate 325 (65 FE) MG tablet Take 1 tablet (325 mg total) by mouth daily with breakfast. Reported on 08/18/2015  . furosemide (LASIX) 40 MG tablet Take 2.5 tablets (100 mg total) by mouth daily.  Marland Kitchen gabapentin (NEURONTIN) 100 MG capsule Take 100 mg by mouth at bedtime.   Marland Kitchen lactulose (CHRONULAC) 10 GM/15ML solution Take 45 mLs (30 g total) by mouth 3 (three) times daily. (Patient taking differently: Take 30 g by mouth daily as needed for mild constipation or moderate constipation. )  . LANTUS SOLOSTAR 100 UNIT/ML Solostar Pen Inject 20 Units into the skin every evening. Reported on 08/18/2015  . levofloxacin (LEVAQUIN) 250 MG tablet Take 1 tablet (250 mg total) by mouth every other day.  . Multiple Vitamin (MULTIVITAMIN WITH MINERALS) TABS tablet Take 1 tablet by mouth daily. Reported on 08/18/2015  . Oxycodone HCl 10 MG TABS Take 10 mg by mouth 3 (three) times daily as needed (for pain).   . pantoprazole (PROTONIX) 40 MG tablet Take 1 tablet (40 mg total) by mouth daily before breakfast.  . potassium chloride (K-DUR) 10 MEQ tablet Take 20 mEq by mouth daily.  . rifaximin (XIFAXAN) 550 MG TABS tablet Take 1 tablet (550 mg total) by mouth 2 (two) times daily.  Marland Kitchen spironolactone (ALDACTONE) 100 MG tablet Take 3 tablets (300 mg  total) by mouth daily.  . traZODone (DESYREL) 50 MG tablet Take 1 tablet (50 mg total) by mouth at bedtime.   Facility-Administered Encounter Medications as of 05/24/2016  Medication  . albumin human 5 % solution 50 g     Objective: Blood pressure 110/62, pulse 68, temperature 98 F (36.7 C), temperature source Oral, resp. rate 18,  height 5\' 6"  (1.676 m), weight 165 lb 3 oz (74.9 kg). Patient is alert and appears to be chronically ill. She does not have asterixis. Conjunctiva is pale. Sclera is nonicteric Oropharyngeal mucosa is normal. No neck masses or thyromegaly noted. Cardiac exam with regular rhythm normal S1 and S2. Faint systolic ejection murmur noted at left sternal border. Lungs are clear to auscultation. Abdomen is distended and somewhat tense. She has generalized tenderness which is primarily on superficial palpation. Easily palpable spleen. Extremities are thin and wasted but no peripheral edema noted. She has muscle tenderness but no skin rash.  Labs/studies Results:   Recent Labs  05/22/16 2155  WBC 4.3  HGB 8.6*  HCT 26.4*  PLT 55*    BMET   Recent Labs  05/22/16 2155  NA 132*  K 3.5  CL 101  CO2 26  GLUCOSE 253*  BUN 14  CREATININE 0.89  CALCIUM 8.1*    LFT   Recent Labs  05/22/16 2155  PROT 5.7*  ALBUMIN 3.0*  AST 23  ALT 27  ALKPHOS 87  BILITOT 0.6    PT/INR   Recent Labs  05/22/16 2155  LABPROT 17.6*  INR 1.44    Ascitic fluid culture from 05/19/2016 and 05/22/2016 negative.  Assessment:  #1.Generalized aches and pain. She is afebrile. She could have viral infection or these symptoms could be secondary to one of her medications. He does not have SBP based on 2 negative cultures within the last 1 week. She was not screen for flu when she was in emergency room she does not have typical symptoms. Screen for evidence of myositis. #2. Recurrent ascites. She is on fairly high dose of diuretic therapy. Renal function is well-preserved. Albuminemia is not profound. Suspect recurrent ascites due to worsening portal hypertension due to portal vein thrombosis. She will need another tap her symptomatic relief. #3. Acute on chronic anemia. No evidence of overt GI bleed. Recent upper GI with small bowel follow-through and barium enema done as a part of evaluation for  transplant and no abnormality noted other than postsurgical changes in the upper GI tract. #4. Hepatic encephalopathy. She appears to be at baseline with therapy.   Plan:  LVAP in near future. CBC with differential, metabolic 7, serum magnesium and CPK. Patient advised to report to emergency room if she has temp. greater than 100 F. Decrease Lexapro to 10 mg by mouth daily Will confer with her hepatologist at [facility] about changes in diuretic therapy. Office visit in 4 weeks.

## 2016-05-24 NOTE — Patient Instructions (Addendum)
Abdominal tap to be scheduled. Physician will call with results of blood tests when completed. Can take Tylenol up to 1 g per day on as-needed basis.

## 2016-05-25 ENCOUNTER — Ambulatory Visit (HOSPITAL_COMMUNITY)
Admission: RE | Admit: 2016-05-25 | Discharge: 2016-05-25 | Disposition: A | Discharge status: Home / Self Care | Payer: Medicare Other | Source: Ambulatory Visit | Attending: Internal Medicine | Admitting: Internal Medicine

## 2016-05-25 ENCOUNTER — Encounter (HOSPITAL_COMMUNITY): Payer: Self-pay

## 2016-05-25 DIAGNOSIS — R188 Other ascites: Secondary | ICD-10-CM | POA: Insufficient documentation

## 2016-05-25 LAB — GRAM STAIN: Gram Stain: NONE SEEN

## 2016-05-25 LAB — CBC WITH DIFFERENTIAL/PLATELET
Basophils Absolute: 44 cells/uL (ref 0–200)
Basophils Relative: 1 %
EOS ABS: 132 {cells}/uL (ref 15–500)
Eosinophils Relative: 3 %
HEMATOCRIT: 28.7 % — AB (ref 35.0–45.0)
Hemoglobin: 9.2 g/dL — ABNORMAL LOW (ref 11.7–15.5)
LYMPHS ABS: 484 {cells}/uL — AB (ref 850–3900)
Lymphocytes Relative: 11 %
MCH: 26.4 pg — ABNORMAL LOW (ref 27.0–33.0)
MCHC: 32.1 g/dL (ref 32.0–36.0)
MCV: 82.5 fL (ref 80.0–100.0)
MPV: 9.9 fL (ref 7.5–12.5)
Monocytes Absolute: 264 cells/uL (ref 200–950)
Monocytes Relative: 6 %
NEUTROS PCT: 79 %
Neutro Abs: 3476 cells/uL (ref 1500–7800)
Platelets: 60 10*3/uL — ABNORMAL LOW (ref 140–400)
RBC: 3.48 MIL/uL — AB (ref 3.80–5.10)
RDW: 16.4 % — ABNORMAL HIGH (ref 11.0–15.0)
WBC: 4.4 10*3/uL (ref 3.8–10.8)

## 2016-05-25 LAB — BASIC METABOLIC PANEL
BUN: 15 mg/dL (ref 7–25)
CO2: 25 mmol/L (ref 20–31)
Calcium: 8 mg/dL — ABNORMAL LOW (ref 8.6–10.4)
Chloride: 102 mmol/L (ref 98–110)
Creat: 1.17 mg/dL — ABNORMAL HIGH (ref 0.50–1.05)
GLUCOSE: 304 mg/dL — AB (ref 65–99)
POTASSIUM: 3.6 mmol/L (ref 3.5–5.3)
SODIUM: 135 mmol/L (ref 135–146)

## 2016-05-25 LAB — BODY FLUID CELL COUNT WITH DIFFERENTIAL
Eos, Fluid: 1 %
LYMPHS FL: 21 %
MONOCYTE-MACROPHAGE-SEROUS FLUID: 64 % (ref 50–90)
Neutrophil Count, Fluid: 14 % (ref 0–25)
Other Cells, Fluid: 0 %
WBC FLUID: 80 uL (ref 0–1000)

## 2016-05-25 MED ORDER — ALBUMIN HUMAN 25 % IV SOLN
50.0000 g | Freq: Once | INTRAVENOUS | Status: AC
  Administered 2016-05-25: 50 g via INTRAVENOUS

## 2016-05-25 MED ORDER — ALBUMIN HUMAN 25 % IV SOLN
INTRAVENOUS | Status: AC
  Administered 2016-05-25: 50 g via INTRAVENOUS
  Filled 2016-05-25: qty 200

## 2016-05-25 NOTE — Progress Notes (Signed)
Paracentesis complete no signs of distress. 6.2L ascites removed.

## 2016-05-26 LAB — PATHOLOGIST SMEAR REVIEW

## 2016-05-26 LAB — MAGNESIUM: Magnesium: 1.6 mg/dL (ref 1.5–2.5)

## 2016-05-26 LAB — CK: Total CK: 40 U/L (ref 7–177)

## 2016-05-28 LAB — CULTURE, BODY FLUID W GRAM STAIN -BOTTLE

## 2016-05-28 LAB — CULTURE, BODY FLUID-BOTTLE: CULTURE: NO GROWTH

## 2016-05-30 DIAGNOSIS — Z01818 Encounter for other preprocedural examination: Secondary | ICD-10-CM | POA: Diagnosis not present

## 2016-05-30 DIAGNOSIS — K7460 Unspecified cirrhosis of liver: Secondary | ICD-10-CM | POA: Diagnosis not present

## 2016-05-30 DIAGNOSIS — B182 Chronic viral hepatitis C: Secondary | ICD-10-CM | POA: Diagnosis not present

## 2016-05-30 LAB — CULTURE, BODY FLUID W GRAM STAIN -BOTTLE

## 2016-05-30 LAB — CULTURE, BODY FLUID-BOTTLE: CULTURE: NO GROWTH

## 2016-05-31 ENCOUNTER — Telehealth (INDEPENDENT_AMBULATORY_CARE_PROVIDER_SITE_OTHER): Payer: Self-pay | Admitting: *Deleted

## 2016-05-31 DIAGNOSIS — K7290 Hepatic failure, unspecified without coma: Secondary | ICD-10-CM | POA: Diagnosis not present

## 2016-05-31 DIAGNOSIS — E441 Mild protein-calorie malnutrition: Secondary | ICD-10-CM | POA: Diagnosis not present

## 2016-05-31 DIAGNOSIS — Z6824 Body mass index (BMI) 24.0-24.9, adult: Secondary | ICD-10-CM | POA: Diagnosis not present

## 2016-05-31 DIAGNOSIS — E119 Type 2 diabetes mellitus without complications: Secondary | ICD-10-CM | POA: Diagnosis not present

## 2016-05-31 NOTE — Telephone Encounter (Signed)
Patient called and left a message that the Zaroxolyn would be okay for her to take per [facility]. Per Dr.Rehman may call in Zaroxolyn 2.5 mg  - she may take 1 every 4 th day. #24.  This was called to [facility]. Patient was called and made aware.

## 2016-06-14 ENCOUNTER — Encounter (INDEPENDENT_AMBULATORY_CARE_PROVIDER_SITE_OTHER): Payer: Self-pay | Admitting: Internal Medicine

## 2016-06-14 DIAGNOSIS — K7460 Unspecified cirrhosis of liver: Secondary | ICD-10-CM | POA: Diagnosis not present

## 2016-06-14 DIAGNOSIS — B182 Chronic viral hepatitis C: Secondary | ICD-10-CM | POA: Diagnosis not present

## 2016-06-17 DIAGNOSIS — K7460 Unspecified cirrhosis of liver: Secondary | ICD-10-CM | POA: Diagnosis not present

## 2016-06-17 DIAGNOSIS — K7290 Hepatic failure, unspecified without coma: Secondary | ICD-10-CM | POA: Diagnosis not present

## 2016-06-17 DIAGNOSIS — G8929 Other chronic pain: Secondary | ICD-10-CM | POA: Diagnosis not present

## 2016-06-17 DIAGNOSIS — B182 Chronic viral hepatitis C: Secondary | ICD-10-CM | POA: Diagnosis not present

## 2016-06-23 ENCOUNTER — Emergency Department (HOSPITAL_COMMUNITY): Payer: Medicare Other

## 2016-06-23 ENCOUNTER — Ambulatory Visit (INDEPENDENT_AMBULATORY_CARE_PROVIDER_SITE_OTHER): Payer: Medicare Other | Admitting: Internal Medicine

## 2016-06-23 ENCOUNTER — Encounter (HOSPITAL_COMMUNITY): Payer: Self-pay | Admitting: *Deleted

## 2016-06-23 ENCOUNTER — Observation Stay (HOSPITAL_COMMUNITY)
Admission: EM | Admit: 2016-06-23 | Discharge: 2016-06-26 | Disposition: A | Discharge status: Home / Self Care | Payer: Medicare Other | Attending: Family Medicine | Admitting: Family Medicine

## 2016-06-23 DIAGNOSIS — R109 Unspecified abdominal pain: Secondary | ICD-10-CM | POA: Diagnosis present

## 2016-06-23 DIAGNOSIS — K7290 Hepatic failure, unspecified without coma: Secondary | ICD-10-CM | POA: Diagnosis not present

## 2016-06-23 DIAGNOSIS — R188 Other ascites: Secondary | ICD-10-CM | POA: Diagnosis present

## 2016-06-23 DIAGNOSIS — R404 Transient alteration of awareness: Secondary | ICD-10-CM

## 2016-06-23 DIAGNOSIS — E119 Type 2 diabetes mellitus without complications: Secondary | ICD-10-CM

## 2016-06-23 DIAGNOSIS — F418 Other specified anxiety disorders: Secondary | ICD-10-CM | POA: Diagnosis present

## 2016-06-23 DIAGNOSIS — Z87891 Personal history of nicotine dependence: Secondary | ICD-10-CM | POA: Insufficient documentation

## 2016-06-23 DIAGNOSIS — J90 Pleural effusion, not elsewhere classified: Secondary | ICD-10-CM | POA: Diagnosis not present

## 2016-06-23 DIAGNOSIS — K7682 Hepatic encephalopathy: Secondary | ICD-10-CM | POA: Diagnosis present

## 2016-06-23 DIAGNOSIS — D61818 Other pancytopenia: Secondary | ICD-10-CM | POA: Diagnosis present

## 2016-06-23 DIAGNOSIS — Z794 Long term (current) use of insulin: Secondary | ICD-10-CM | POA: Diagnosis not present

## 2016-06-23 DIAGNOSIS — Z79899 Other long term (current) drug therapy: Secondary | ICD-10-CM | POA: Diagnosis not present

## 2016-06-23 DIAGNOSIS — K7460 Unspecified cirrhosis of liver: Secondary | ICD-10-CM | POA: Diagnosis not present

## 2016-06-23 DIAGNOSIS — E876 Hypokalemia: Secondary | ICD-10-CM | POA: Diagnosis present

## 2016-06-23 DIAGNOSIS — R4182 Altered mental status, unspecified: Secondary | ICD-10-CM | POA: Diagnosis not present

## 2016-06-23 DIAGNOSIS — K7200 Acute and subacute hepatic failure without coma: Secondary | ICD-10-CM

## 2016-06-23 DIAGNOSIS — R1084 Generalized abdominal pain: Secondary | ICD-10-CM | POA: Diagnosis not present

## 2016-06-23 LAB — CBC WITH DIFFERENTIAL/PLATELET
Basophils Absolute: 0 10*3/uL (ref 0.0–0.1)
Basophils Relative: 0 %
EOS PCT: 1 %
Eosinophils Absolute: 0.1 10*3/uL (ref 0.0–0.7)
HCT: 27.2 % — ABNORMAL LOW (ref 36.0–46.0)
Hemoglobin: 9 g/dL — ABNORMAL LOW (ref 12.0–15.0)
LYMPHS PCT: 11 %
Lymphs Abs: 0.5 10*3/uL — ABNORMAL LOW (ref 0.7–4.0)
MCH: 26.9 pg (ref 26.0–34.0)
MCHC: 33.1 g/dL (ref 30.0–36.0)
MCV: 81.4 fL (ref 78.0–100.0)
MONO ABS: 0.3 10*3/uL (ref 0.1–1.0)
MONOS PCT: 6 %
Neutro Abs: 4 10*3/uL (ref 1.7–7.7)
Neutrophils Relative %: 82 %
PLATELETS: 56 10*3/uL — AB (ref 150–400)
RBC: 3.34 MIL/uL — ABNORMAL LOW (ref 3.87–5.11)
RDW: 16.8 % — AB (ref 11.5–15.5)
WBC: 4.9 10*3/uL (ref 4.0–10.5)

## 2016-06-23 LAB — URINALYSIS, ROUTINE W REFLEX MICROSCOPIC
BILIRUBIN URINE: NEGATIVE
Glucose, UA: NEGATIVE mg/dL
Hgb urine dipstick: NEGATIVE
KETONES UR: NEGATIVE mg/dL
Leukocytes, UA: NEGATIVE
NITRITE: NEGATIVE
PH: 5 (ref 5.0–8.0)
Protein, ur: NEGATIVE mg/dL
Specific Gravity, Urine: 1.017 (ref 1.005–1.030)

## 2016-06-23 LAB — PROTIME-INR
INR: 1.4
PROTHROMBIN TIME: 17.3 s — AB (ref 11.4–15.2)

## 2016-06-23 LAB — COMPREHENSIVE METABOLIC PANEL
ALT: 29 U/L (ref 14–54)
AST: 28 U/L (ref 15–41)
Albumin: 2.8 g/dL — ABNORMAL LOW (ref 3.5–5.0)
Alkaline Phosphatase: 81 U/L (ref 38–126)
Anion gap: 7 (ref 5–15)
BILIRUBIN TOTAL: 1.2 mg/dL (ref 0.3–1.2)
BUN: 24 mg/dL — ABNORMAL HIGH (ref 6–20)
CHLORIDE: 96 mmol/L — AB (ref 101–111)
CO2: 31 mmol/L (ref 22–32)
CREATININE: 1.05 mg/dL — AB (ref 0.44–1.00)
Calcium: 8.5 mg/dL — ABNORMAL LOW (ref 8.9–10.3)
GFR, EST NON AFRICAN AMERICAN: 59 mL/min — AB (ref >60)
Glucose, Bld: 288 mg/dL — ABNORMAL HIGH (ref 65–99)
POTASSIUM: 3.2 mmol/L — AB (ref 3.5–5.1)
Sodium: 134 mmol/L — ABNORMAL LOW (ref 135–145)
TOTAL PROTEIN: 6.1 g/dL — AB (ref 6.5–8.1)

## 2016-06-23 LAB — AMMONIA: Ammonia: 100 umol/L — ABNORMAL HIGH (ref 9–35)

## 2016-06-23 LAB — LIPASE, BLOOD: Lipase: 19 U/L (ref 11–51)

## 2016-06-23 MED ORDER — ONDANSETRON HCL 4 MG/2ML IJ SOLN: 4.0000 mg | Freq: Four times a day (QID) | INTRAMUSCULAR | Status: DC | PRN

## 2016-06-23 MED ORDER — LIDOCAINE-EPINEPHRINE (PF) 2 %-1:200000 IJ SOLN
20.0000 mL | Freq: Once | INTRAMUSCULAR | Status: AC
  Administered 2016-06-23: 20 mL
  Filled 2016-06-23: qty 20

## 2016-06-23 MED ORDER — GABAPENTIN 100 MG PO CAPS
100.0000 mg | ORAL_CAPSULE | Freq: Every day | ORAL | Status: DC
  Administered 2016-06-24 – 2016-06-25 (×3): 100 mg via ORAL
  Filled 2016-06-23 (×3): qty 1

## 2016-06-23 MED ORDER — RIFAXIMIN 550 MG PO TABS
550.0000 mg | ORAL_TABLET | Freq: Two times a day (BID) | ORAL | Status: DC
  Administered 2016-06-24 – 2016-06-26 (×6): 550 mg via ORAL
  Filled 2016-06-23 (×6): qty 1

## 2016-06-23 MED ORDER — SODIUM CHLORIDE 0.9 % IV SOLN: 250.0000 mL | INTRAVENOUS | Status: DC | PRN

## 2016-06-23 MED ORDER — POTASSIUM CHLORIDE CRYS ER 20 MEQ PO TBCR
40.0000 meq | EXTENDED_RELEASE_TABLET | Freq: Once | ORAL | Status: AC
  Administered 2016-06-23: 40 meq via ORAL
  Filled 2016-06-23: qty 2

## 2016-06-23 MED ORDER — ESCITALOPRAM OXALATE 10 MG PO TABS
20.0000 mg | ORAL_TABLET | Freq: Every day | ORAL | Status: DC
  Administered 2016-06-24 – 2016-06-26 (×3): 20 mg via ORAL
  Filled 2016-06-23 (×3): qty 2
  Filled 2016-06-23: qty 1

## 2016-06-23 MED ORDER — POTASSIUM CHLORIDE CRYS ER 20 MEQ PO TBCR
20.0000 meq | EXTENDED_RELEASE_TABLET | Freq: Every day | ORAL | Status: DC
  Administered 2016-06-24: 20 meq via ORAL
  Filled 2016-06-23 (×2): qty 1

## 2016-06-23 MED ORDER — LACTULOSE 10 GM/15ML PO SOLN
30.0000 g | Freq: Once | ORAL | Status: AC
  Administered 2016-06-23: 30 g via ORAL
  Filled 2016-06-23: qty 60

## 2016-06-23 MED ORDER — B COMPLEX-C PO TABS
1.0000 | ORAL_TABLET | Freq: Every day | ORAL | Status: DC
  Administered 2016-06-24 – 2016-06-26 (×3): 1 via ORAL
  Filled 2016-06-23 (×3): qty 1

## 2016-06-23 MED ORDER — INSULIN GLARGINE 100 UNIT/ML ~~LOC~~ SOLN
20.0000 [IU] | Freq: Every day | SUBCUTANEOUS | Status: DC
  Administered 2016-06-24 – 2016-06-25 (×3): 20 [IU] via SUBCUTANEOUS
  Filled 2016-06-23 (×4): qty 0.2

## 2016-06-23 MED ORDER — FERROUS SULFATE 325 (65 FE) MG PO TABS
325.0000 mg | ORAL_TABLET | Freq: Every day | ORAL | Status: DC
  Administered 2016-06-24 – 2016-06-26 (×3): 325 mg via ORAL
  Filled 2016-06-23 (×3): qty 1

## 2016-06-23 MED ORDER — SPIRONOLACTONE 100 MG PO TABS
300.0000 mg | ORAL_TABLET | Freq: Every day | ORAL | Status: DC
  Administered 2016-06-24 – 2016-06-26 (×3): 300 mg via ORAL
  Filled 2016-06-23 (×3): qty 3

## 2016-06-23 MED ORDER — ADULT MULTIVITAMIN W/MINERALS CH
1.0000 | ORAL_TABLET | Freq: Every day | ORAL | Status: DC
  Administered 2016-06-24 – 2016-06-26 (×3): 1 via ORAL
  Filled 2016-06-23 (×3): qty 1

## 2016-06-23 MED ORDER — ACETAMINOPHEN 650 MG RE SUPP: 650.0000 mg | Freq: Four times a day (QID) | RECTAL | Status: DC | PRN

## 2016-06-23 MED ORDER — ONDANSETRON HCL 4 MG PO TABS: 4.0000 mg | ORAL_TABLET | Freq: Four times a day (QID) | ORAL | Status: DC | PRN

## 2016-06-23 MED ORDER — OXYCODONE HCL 5 MG PO TABS
10.0000 mg | ORAL_TABLET | Freq: Four times a day (QID) | ORAL | Status: DC | PRN
  Administered 2016-06-24 – 2016-06-25 (×4): 10 mg via ORAL
  Filled 2016-06-23 (×5): qty 2

## 2016-06-23 MED ORDER — SODIUM CHLORIDE 0.9% FLUSH
3.0000 mL | Freq: Two times a day (BID) | INTRAVENOUS | Status: DC
  Administered 2016-06-24 – 2016-06-26 (×4): 3 mL via INTRAVENOUS

## 2016-06-23 MED ORDER — INSULIN ASPART 100 UNIT/ML ~~LOC~~ SOLN
0.0000 [IU] | Freq: Every day | SUBCUTANEOUS | Status: DC
  Administered 2016-06-24: 2 [IU] via SUBCUTANEOUS

## 2016-06-23 MED ORDER — TRAZODONE HCL 50 MG PO TABS
50.0000 mg | ORAL_TABLET | Freq: Every day | ORAL | Status: DC
  Administered 2016-06-24 – 2016-06-25 (×3): 50 mg via ORAL
  Filled 2016-06-23 (×3): qty 1

## 2016-06-23 MED ORDER — ACETAMINOPHEN 325 MG PO TABS: 650.0000 mg | ORAL_TABLET | Freq: Four times a day (QID) | ORAL | Status: DC | PRN

## 2016-06-23 MED ORDER — SODIUM CHLORIDE 0.9% FLUSH
3.0000 mL | INTRAVENOUS | Status: DC | PRN
  Administered 2016-06-24: 3 mL via INTRAVENOUS
  Filled 2016-06-23: qty 3

## 2016-06-23 MED ORDER — FUROSEMIDE 40 MG PO TABS
100.0000 mg | ORAL_TABLET | Freq: Every day | ORAL | Status: DC
  Administered 2016-06-24 – 2016-06-26 (×3): 100 mg via ORAL
  Filled 2016-06-23 (×3): qty 3

## 2016-06-23 MED ORDER — LACTULOSE 10 GM/15ML PO SOLN
30.0000 g | Freq: Three times a day (TID) | ORAL | Status: DC
  Administered 2016-06-24 (×2): 30 g via ORAL
  Filled 2016-06-23 (×3): qty 60

## 2016-06-23 MED ORDER — LEVOFLOXACIN 250 MG PO TABS
250.0000 mg | ORAL_TABLET | ORAL | Status: DC
  Administered 2016-06-24 – 2016-06-26 (×2): 250 mg via ORAL
  Filled 2016-06-23 (×3): qty 1

## 2016-06-23 MED ORDER — PANTOPRAZOLE SODIUM 40 MG PO TBEC
40.0000 mg | DELAYED_RELEASE_TABLET | Freq: Every day | ORAL | Status: DC
  Administered 2016-06-24 – 2016-06-26 (×3): 40 mg via ORAL
  Filled 2016-06-23 (×3): qty 1

## 2016-06-23 MED ORDER — INSULIN ASPART 100 UNIT/ML ~~LOC~~ SOLN
0.0000 [IU] | Freq: Three times a day (TID) | SUBCUTANEOUS | Status: DC
  Administered 2016-06-24 (×2): 5 [IU] via SUBCUTANEOUS
  Administered 2016-06-25: 8 [IU] via SUBCUTANEOUS
  Administered 2016-06-25: 2 [IU] via SUBCUTANEOUS
  Administered 2016-06-25: 5 [IU] via SUBCUTANEOUS
  Administered 2016-06-26: 8 [IU] via SUBCUTANEOUS
  Administered 2016-06-26: 3 [IU] via SUBCUTANEOUS

## 2016-06-23 NOTE — ED Notes (Signed)
Patient transported to CT 

## 2016-06-23 NOTE — ED Notes (Signed)
ED Provider at bedside for procedure. 

## 2016-06-23 NOTE — ED Notes (Signed)
ED Provider at bedside. 

## 2016-06-23 NOTE — ED Notes (Signed)
Pt up to bathroom, able to walk slowly to [facility] with family and nurse assist. PT back to room.

## 2016-06-23 NOTE — ED Triage Notes (Signed)
Pt's family member states pt has had altered loc since yesterday; states pt has been laying in bed, not getting up and decrease in appetite; family thinks pt's ammonia level may be increased

## 2016-06-23 NOTE — H&P (Signed)
History and Physical    [name] SVX:[id number] DOB: [birth year] DOA: 06/23/2016  PCP: Redmond School, MD   Patient coming from: Home  Chief Complaint: Confusion, gen weakness, abd pain  HPI: [name] is a 56 y.o. female with medical history significant for hepatitis C status post successful treatment, cirrhosis with ascites, depression with anxiety, insulin-dependent diabetes mellitus, and chronic anemia with thrombocytopenia who presents to the emergency department for evaluation of 2 days of confusion, generalized weakness, and intermittent abdominal pain. Patient is accompanied by her family who assists with the history. She reportedly been in her usual state of health until yesterday when she was noted to become generally weak and confused. This waxed and waned throughout the day yesterday, and by this morning it seemed to worsen some. Patient remained confused and weak today, staying in bed. She denies any recent fevers or chills and denies any significant cough, dyspnea, dysuria, or flank pain. There has not been any recent fall or trauma and she denies any recent use of alcohol or illicit substances. Patient also notes some mild intermittent abdominal pain which she describes as generalized, sharp, and without alleviating or exacerbating factors. Patient's family notes that she has developed similar symptoms previously when her ammonia level has been elevated. She has been using lactulose at home, but only as needed for constipation, which she has not had recently. She endorses continued adherence with her rifaximin.  ED Course: Upon arrival to the ED, patient is found to be afebrile, saturating well on room air, and with vital signs stable. Noncontrast head CT is negative for acute intracranial abnormality and chest x-ray is notable for right sided pleural effusion and basilar opacity, likely representing atelectasis. Chemistry panels notable for mild hyponatremia and hypokalemia. Serum  glucose is 288. Ammonia level is 100. CBC features a stable normocytic anemia with hemoglobin of 9.0 and a stable thrombocytopenia with platelets 56,000. INR is 1.40. Urinalysis is unremarkable. Diagnostic paracentesis was performed by the ED physician and the studies remained pending at this time. The patient was treated with 30 g of oral lactulose. She remained hemodynamically stable and in no apparent respiratory distress and will be admitted to the medical/surgical unit for ongoing evaluation and management of acute hepatic encephalopathy and mild intermittent abdominal pain.   Review of Systems:  All other systems reviewed and apart from HPI, are negative.  Past Medical History:  Diagnosis Date  . Anemia   . Anxiety   . Ascites   . 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 Complication of anesthesia   . Depression   . Diabetes mellitus type 2, controlled, without complications ([place])   . Enteropathy 07/31/2015   Portal hypertensive enteropathy per capsule study, with stigmata of bleeding.  . Esophageal varices ([place])   . Gastric ulcer 07/15/2015   per EGD; no stigmata of bleeding  . GI bleed 07/17/2015  . Gram-negative bacteremia 12/08/2011  . QZRAQTMA(263.3)    "monthly" (11/07/2012)  . Heart murmur   . Hepatitis C    Hx: of Hep "C" it was eradicated  . History of hepatitis C - successfuly treated medically 07/14/2015  . Kidney stones   . Liver failure ([place])    "I'm on liver transplant list @ [facility]" (11/07/2012)  . PONV (postoperative nausea and vomiting)   . Portal vein thrombosis   . Renal insufficiency   . Right ureteral stone 12/07/2011  . 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 Type II diabetes mellitus ([place])    "  not since gastric bypass" (11/07/2012)    Past Surgical History:  Procedure Laterality Date  . ABDOMINAL HYSTERECTOMY  2003  . CESAREAN SECTION  1995  . CHOLECYSTECTOMY  1987  . COLONOSCOPY    . COLONOSCOPY  10/21/2011   Procedure: COLONOSCOPY;  Surgeon: Rogene Houston, MD;  [facility];  Service: Endoscopy;  Laterality: N/A;  245   . CYSTOSCOPY W/ RETROGRADES  12/08/2011   Procedure: CYSTOSCOPY WITH RETROGRADE PYELOGRAM;  Surgeon: Marissa Nestle, MD;  Location: [facility];  Service: Urology;  Laterality: Right;  . CYSTOSCOPY WITH STENT PLACEMENT  12/08/2011   Procedure: CYSTOSCOPY WITH STENT PLACEMENT;  Surgeon: Marissa Nestle, MD;  Location: [facility];  Service: Urology;  Laterality: Right;  . DILATION AND CURETTAGE OF UTERUS    . ESOPHAGOGASTRODUODENOSCOPY N/A 02/19/2014   Procedure: ESOPHAGOGASTRODUODENOSCOPY (EGD);  Surgeon: Rogene Houston, MD;  [facility];  Service: Endoscopy;  Laterality: N/A;  100  . ESOPHAGOGASTRODUODENOSCOPY N/A 07/15/2015   Procedure: ESOPHAGOGASTRODUODENOSCOPY (EGD);  Surgeon: Rogene Houston, MD;  [facility];  Service: Endoscopy;  Laterality: N/A;  . ESOPHAGOGASTRODUODENOSCOPY N/A 07/29/2015   Procedure: ESOPHAGOGASTRODUODENOSCOPY (EGD);  Surgeon: Rogene Houston, MD;  [facility];  Service: Endoscopy;  Laterality: N/A;  . GASTRIC BYPASS  ~ 1995  . HERNIA REPAIR  04/14/11   "one in my bellybutton" (11/07/2012)  . INGUINAL HERNIA REPAIR Right    "maybe 2" (11/07/2012)  . KNEE ARTHROSCOPY Right   . LITHOTRIPSY     "several times" (11/07/2012)  . OPEN REDUCTION INTERNAL FIXATION (ORIF) DISTAL RADIAL FRACTURE Right 11/07/2012   Procedure: OPEN REDUCTION INTERNAL FIXATION (ORIF) RIGHT DISTAL RADIAL FRACTURE;  Surgeon: [name], MD;  Location: [facility];  Service: Orthopedics;  Laterality: Right;  . ORIF DISTAL RADIUS FRACTURE Right 11/07/2012  . UPPER GASTROINTESTINAL ENDOSCOPY       reports that she quit smoking about 25 years ago. Her smoking use included Cigarettes. She has a 2.50 pack-year smoking history. She has never used smokeless tobacco. She reports that she does not drink alcohol or use drugs.  Allergies  Allergen Reactions  . Ancef [Cefazolin Sodium] Other (See  Comments)    Reaction:  Vaginal and mouth blisters  . Gadobenate Itching, Nausea And Vomiting and Rash  . Iodinated Diagnostic Agents Itching, Nausea And Vomiting and Rash  . Tape Rash    Family History  Problem Relation Age of Onset  . Dementia Mother   . Heart disease Father   . Liver disease Father   . Hypertension Father   . Diabetes Father   . Dementia Father   . Hypertension Brother   . Other Son        Cervical dystonia     Prior to Admission medications   Medication Sig Start Date End Date Taking? Authorizing Provider  b complex vitamins tablet Take 1 tablet by mouth daily.    Yes [provider]  escitalopram (LEXAPRO) 20 MG tablet Take 20 mg by mouth daily.    Yes Rehman, Mechele Dawley, MD  ferrous sulfate 325 (65 FE) MG tablet Take 325 mg by mouth daily with breakfast.    Yes Rehman, Najeeb U, MD  furosemide (LASIX) 40 MG tablet Take 2.5 tablets (100 mg total) by mouth daily. 11/14/15  Yes Kathie Dike, MD  gabapentin (NEURONTIN) 100 MG capsule Take 100-200 mg by mouth at bedtime.    Yes [provider]  insulin glargine (LANTUS) 100  unit/mL SOPN Inject 20 Units into the skin at bedtime.   Yes [provider]  levofloxacin (LEVAQUIN) 250 MG tablet Take 1 tablet (250 mg total) by mouth every other day. 03/22/16  Yes Rehman, Mechele Dawley, MD  Multiple Vitamin (MULTIVITAMIN WITH MINERALS) TABS tablet Take 1 tablet by mouth daily.    Yes [provider]  Oxycodone HCl 10 MG TABS Take 10 mg by mouth every 6 (six) hours as needed (for pain).    Yes [provider]  pantoprazole (PROTONIX) 40 MG tablet Take 1 tablet (40 mg total) by mouth daily before breakfast. 03/22/16  Yes Rehman, Mechele Dawley, MD  potassium chloride SA (K-DUR,KLOR-CON) 20 MEQ tablet Take 20 mEq by mouth daily.   Yes [provider]  rifaximin (XIFAXAN) 550 MG TABS tablet Take 1 tablet (550 mg total) by mouth 2 (two) times daily. 09/24/15  Yes Rehman, Mechele Dawley, MD    spironolactone (ALDACTONE) 100 MG tablet Take 3 tablets (300 mg total) by mouth daily. 11/16/15  Yes Setzer, Rona Ravens, NP  traZODone (DESYREL) 50 MG tablet Take 1 tablet (50 mg total) by mouth at bedtime. 11/11/15  Yes Thurnell Lose, MD    Physical Exam: Vitals:   06/23/16 2038 06/23/16 2100 06/23/16 2130 06/23/16 2300  BP:  112/65 118/64 112/72  Pulse:  78 76 80  Resp:  17 18 18   Temp:      TempSrc:      SpO2:  95% 95% 94%  Weight: 63.5 kg (140 lb)     Height: 5\' 6"  (1.676 m)         Constitutional: NAD, calm, chronically-ill appearing, frail.  Eyes: PERTLA, lids and conjunctivae normal ENMT: Mucous membranes are moist. Posterior pharynx clear of any exudate or lesions.   Neck: normal, supple, no masses, no thyromegaly Respiratory: Diminished at bases, no wheezing, no crackles, no rhonci. Normal respiratory effort.  Cardiovascular: S1 & S2 heard, regular rate and rhythm, grade 3 holosystolic murmur at RUSB. No significant leg edema.  Abdomen: Soft. Distended, but soft, with fluid-wave. Tender generally without rebound pain or guarding. Bowel sounds active.  Musculoskeletal: no clubbing / cyanosis. No joint deformity upper and lower extremities.  Skin: no significant rashes, lesions, ulcers. Warm, dry, well-perfused. Neurologic: CN 2-12 grossly intact. Sensation intact, DTR normal. Strength 5/5 in all 4 limbs. +asterixis.   Psychiatric: Alert and oriented x 3. Intermittently confused.     Labs on Admission: I have personally reviewed following labs and imaging studies  CBC:  Recent Labs Lab 06/23/16 2125  WBC 4.9  NEUTROABS 4.0  HGB 9.0*  HCT 27.2*  MCV 81.4  PLT 56*   Basic Metabolic Panel:  Recent Labs Lab 06/23/16 2125  NA 134*  K 3.2*  CL 96*  CO2 31  GLUCOSE 288*  BUN 24*  CREATININE 1.05*  CALCIUM 8.5*   GFR: Estimated Creatinine Clearance: 57.3 mL/min (A) (by C-G formula based on SCr of 1.05 mg/dL (H)). Liver Function Tests:  Recent Labs Lab  06/23/16 2125  AST 28  ALT 29  ALKPHOS 81  BILITOT 1.2  PROT 6.1*  ALBUMIN 2.8*    Recent Labs Lab 06/23/16 2125  LIPASE 19    Recent Labs Lab 06/23/16 2125  AMMONIA 100*   Coagulation Profile:  Recent Labs Lab 06/23/16 2125  INR 1.40   Cardiac Enzymes: No results for input(s): CKTOTAL, CKMB, CKMBINDEX, TROPONINI in the last 168 hours. BNP (last 3 results) No results for input(s): PROBNP in  the last 8760 hours. HbA1C: No results for input(s): HGBA1C in the last 72 hours. CBG: No results for input(s): GLUCAP in the last 168 hours. Lipid Profile: No results for input(s): CHOL, HDL, LDLCALC, TRIG, CHOLHDL, LDLDIRECT in the last 72 hours. Thyroid Function Tests: No results for input(s): TSH, T4TOTAL, FREET4, T3FREE, THYROIDAB in the last 72 hours. Anemia Panel: No results for input(s): VITAMINB12, FOLATE, FERRITIN, TIBC, IRON, RETICCTPCT in the last 72 hours. Urine analysis:    Component Value Date/Time   COLORURINE YELLOW 06/23/2016 2106   APPEARANCEUR CLEAR 06/23/2016 2106   LABSPEC 1.017 06/23/2016 2106   PHURINE 5.0 06/23/2016 2106   GLUCOSEU NEGATIVE 06/23/2016 2106   HGBUR NEGATIVE 06/23/2016 2106   BILIRUBINUR NEGATIVE 06/23/2016 2106   KETONESUR NEGATIVE 06/23/2016 2106   PROTEINUR NEGATIVE 06/23/2016 2106   UROBILINOGEN 0.2 05/28/2013 0206   NITRITE NEGATIVE 06/23/2016 2106   LEUKOCYTESUR NEGATIVE 06/23/2016 2106   Sepsis Labs: @LABRCNTIP (procalcitonin:4,lacticidven:4) )No results found for this or any previous visit (from the past 240 hour(s)).   Radiological Exams on Admission: Dg Chest 2 View  Result Date: 06/23/2016 CLINICAL DATA:  Weakness, fatigue, intermittent confusion. History of cirrhosis. EXAM: CHEST  2 VIEW COMPARISON:  Chest radiographs 05/22/2016 FINDINGS: Moderate right pleural effusion and basilar opacity. Left lung is clear. Unchanged heart size and mediastinal contours. No definite pulmonary edema. No pneumothorax. No acute  osseous abnormality. IMPRESSION: Moderate right pleural effusion and basilar opacity, likely atelectasis. In a patient with cirrhosis this is likely passive effusion in the setting of intra-abdominal ascites. Electronically Signed   By: Jeb Levering M.D.   On: 06/23/2016 21:38   Ct Head Wo Contrast  Result Date: 06/23/2016 CLINICAL DATA:  Fatigue, weakness, altered mental steps since yesterday. History of cirrhosis. EXAM: CT HEAD WITHOUT CONTRAST TECHNIQUE: Contiguous axial images were obtained from the base of the skull through the vertex without intravenous contrast. COMPARISON:  Head CT 02/23/2007 FINDINGS: Brain: No evidence of acute infarction, hemorrhage, hydrocephalus, extra-axial collection or mass lesion/mass effect. Vascular: Mild atherosclerosis of skullbase vasculature without hyperdense vessel or abnormal calcification. Skull: Normal. Negative for fracture or focal lesion. Sinuses/Orbits: Paranasal sinuses and mastoid air cells are clear. The visualized orbits are unremarkable. Other: None. IMPRESSION: No acute intracranial abnormality. Electronically Signed   By: Jeb Levering M.D.   On: 06/23/2016 21:30    EKG: Not performed.    Assessment/Plan  1. Acute hepatic encephalopathy  - Pt presents with 2 days of confusion and gen weakness - Head CT negative for acute intracranial abnormality and no focal neurologic deficits identified - Ammonia level 100, asterixis noted, and this is likely represents hepatic encephalopathy - Pt reports continued adherence with rifaximin at home, but has only been using lactulose prn constipation and denies any recent constipation  - She was given 30 g lactulose PO in ED - Plan to continue lactulose 30 g TID, titrated to achieve 3 soft BM's/day; continue rifaximin    2. Cirrhosis with ascites  - Pt with cirrhosis, attributed to hx of hep C which has been treated  - MELD score 11 on admission  - She is known to have varices, not on beta-blocker,  possibly d/t soft BP's  - She has hx of SBP and will be continued on Levaquin for ppx  - Continue Lasix and Aldactone, PPI; continue rifaximin and lactulose as above   3. Abdominal pain - Pt has had mild intermittent abd for 2 days leading up to presentation - She has hx of  SBP and is managed with Levaquin prophylaxis  - Diagnostic paracentesis performed in ED with studies pending; will follow-up and treat as indicated    4. Anemia, thrombocytopenia  - Hgb stable at 9.0 and platelets stable at 56k  - No bleeding evident; continue to supplement iron and B-vitamins   5. Insulin-dependent DM  - A1c was 6.7% in 2015; serum glucose 288 on admission  - Managed at home with Lantus 20 units qHS  - Check CBG with meals and qHS  - Continue Lantus 20 units qHS with moderate-intensity SSI   6. Hypokalemia - Serum potassium is 3.2 on admission, likely from loop diuretic  - She was given 40 mEq oral potassium on admission  - Repeat chem panel in am    7. Depression, anxiety  - Stable  - Continue Lexapro    DVT prophylaxis: SCD's  Code Status: Full  Family Communication: Son and significant other updated at bedside with patient's permission Disposition Plan: Observe on med-surg Consults called: None Admission status: Observation    Vianne Bulls, MD [facility] Pager [phone]  If 7PM-7AM, please contact night-coverage [email or web address] Password [email or web address]  06/23/2016, 11:50 PM

## 2016-06-23 NOTE — ED Provider Notes (Signed)
[name] DEPT Provider Note   CSN: [id number] Arrival date & time: 06/23/16  2029  By signing my name below, I, [name], attest that this documentation has been prepared under the direction and in the presence of [name], Natasha Mcalpine, MD. Electronically Signed: Marcello Russell, ED Scribe. 06/23/16. 9:29 PM.   History   Chief Complaint Chief Complaint  Patient presents with  . Altered Mental Status   The history is provided by the patient, the spouse and a relative. No language interpreter was used.  Altered Mental Status   This is a new problem. The problem has been gradually improving. Associated symptoms include confusion and weakness. [name] past medical history is significant for liver disease.   HPI Comments: [name] is a 56 y.o. female, with a PMHx of cirrhosis, presents to the Emergency Department complaining of gradually worsening confusion and disorientation that began approximately 2 days ago, per son and spouse. Per family, they report [name] staying in bed all day today. Pt has associated weakness and ULQ chronic abdominal pain. The pt's family reports that [name] is a candidate for liver transplantation. [name] currently has a gastroenterologist within the [facility], and the surgeon to perform [name] hepatic transplantation is within the [facility]. The pt reports that [name] is still taking [name] prescribed medications medication and that [name] stool is normal.. [name] also states that [name] has been eating and drinking adequately. The pt's last paracentesis was a month ago. Pt denies coughing, fever, congestion, nausea, vomiting, dysuria, difficulty urinating, SOB, melena, hematochezia, and syncope.  Past Medical History:  Diagnosis Date  . Anemia   . Anxiety   . Ascites   . 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 Complication of anesthesia   . Depression   . Diabetes mellitus type 2, controlled, without complications ([place])   . Enteropathy 07/31/2015   Portal  hypertensive enteropathy per capsule study, with stigmata of bleeding.  . Esophageal varices ([place])   . Gastric ulcer 07/15/2015   per EGD; no stigmata of bleeding  . GI bleed 07/17/2015  . Gram-negative bacteremia 12/08/2011  . YQIHKVQQ(595.6)    "monthly" (11/07/2012)  . Heart murmur   . Hepatitis C    Hx: of Hep "C" it was eradicated  . History of hepatitis C - successfuly treated medically 07/14/2015  . Kidney stones   . Liver failure ([place])    "I'm on liver transplant list @ [facility]" (11/07/2012)  . PONV (postoperative nausea and vomiting)   . Portal vein thrombosis   . Renal insufficiency   . Right ureteral stone 12/07/2011  . 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 Type II diabetes mellitus ([place])    "not since gastric bypass" (11/07/2012)    Patient Active Problem List   Diagnosis Date Noted  . Pancytopenia ([place]) 11/13/2015  . Cirrhosis of liver with ascites ([place])   . Abdominal pain 11/10/2015  . Alcoholic cirrhosis of liver with ascites ([place])   . Enteropathy 07/31/2015  . Anemia, blood loss   . Depression with anxiety 07/30/2015  . Esophageal varices in cirrhosis ([place]) 07/30/2015  . Gastritis determined by endoscopy 07/30/2015  . Hypotension 07/30/2015  . Hyponatremia 07/29/2015  . Hypokalemia 07/29/2015  . GI bleeding 07/29/2015  . Gastric ulcer 07/29/2015  . Leukopenia 07/29/2015  . AKI (acute kidney injury) ([place]) 07/29/2015  . Diabetes mellitus type 2, controlled, without complications ([place]) 38/75/6433  . GI bleed 07/17/2015  . Palliative care encounter   .  Goals of care, counseling/discussion   . DNR (do not resuscitate) discussion   . 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 Melena 07/14/2015  . Hepatic cirrhosis due to chronic hepatitis C infection ([place]) 07/14/2015  . History of hepatitis C - successfuly treated medically 07/14/2015  . Portal vein thrombosis 05/13/2014  . Other malaise and fatigue 08/05/2013  . Hypoxemia 08/05/2013  . Abdominal  pain, right upper quadrant 05/29/2013  . SBP (spontaneous bacterial peritonitis) ([place]) 05/28/2013  . Distal radius fracture 10/25/2012  . Metatarsal bone fracture 10/25/2012  . Insomnia 09/25/2012  . Acute hepatic encephalopathy 06/07/2012  . Hyperglycemia 06/06/2012  . LUQ abdominal pain 06/05/2012  . Ascites 05/14/2012  . Gram-negative bacteremia 12/08/2011  . Pyelonephritis 12/07/2011  . Hydronephrosis of right kidney 12/07/2011  . Right ureteral stone 12/07/2011  . Thrombocytopenia ([place]) 12/07/2011  . Cirrhosis of liver ([place]) 05/02/2011  . Acute blood loss anemia 05/02/2011    Past Surgical History:  Procedure Laterality Date  . ABDOMINAL HYSTERECTOMY  2003  . CESAREAN SECTION  1995  . CHOLECYSTECTOMY  1987  . COLONOSCOPY    . COLONOSCOPY  10/21/2011   Procedure: COLONOSCOPY;  Surgeon: Rogene Houston, MD;  Location[facility];  Service: Endoscopy;  Laterality: N/A;  245   . CYSTOSCOPY W/ RETROGRADES  12/08/2011   Procedure: CYSTOSCOPY WITH RETROGRADE PYELOGRAM;  Surgeon: Marissa Nestle, MD;  Location: [facility] OR[facility];  Service: Urology;  Laterality: Right;  . CYSTOSCOPY WITH STENT PLACEMENT  12/08/2011   Procedure: CYSTOSCOPY WITH STENT PLACEMENT;  Surgeon: Marissa Nestle, MD;  Location: [facility];  Service: Urology;  Laterality: Right;  . DILATION AND CURETTAGE OF UTERUS    . ESOPHAGOGASTRODUODENOSCOPY N/A 02/19/2014   Procedure: ESOPHAGOGASTRODUODENOSCOPY (EGD);  Surgeon: Rogene Houston, MD;  Location[facility];  Service: Endoscopy;  Laterality: N/A;  100  . ESOPHAGOGASTRODUODENOSCOPY N/A 07/15/2015   Procedure: ESOPHAGOGASTRODUODENOSCOPY (EGD);  Surgeon: Rogene Houston, MD;  [facility];  Service: Endoscopy;  Laterality: N/A;  . ESOPHAGOGASTRODUODENOSCOPY N/A 07/29/2015   Procedure: ESOPHAGOGASTRODUODENOSCOPY (EGD);  Surgeon: Rogene Houston, MD;  [facility];  Service: Endoscopy;  Laterality: N/A;  . GASTRIC BYPASS  ~ 1995  . HERNIA REPAIR   04/14/11   "one in my bellybutton" (11/07/2012)  . INGUINAL HERNIA REPAIR Right    "maybe 2" (11/07/2012)  . KNEE ARTHROSCOPY Right   . LITHOTRIPSY     "several times" (11/07/2012)  . OPEN REDUCTION INTERNAL FIXATION (ORIF) DISTAL RADIAL FRACTURE Right 11/07/2012   Procedure: OPEN REDUCTION INTERNAL FIXATION (ORIF) RIGHT DISTAL RADIAL FRACTURE;  Surgeon: [name], MD;  Location: [facility];  Service: Orthopedics;  Laterality: Right;  . ORIF DISTAL RADIUS FRACTURE Right 11/07/2012  . UPPER GASTROINTESTINAL ENDOSCOPY      OB History    Gravida Para Term Preterm AB Living   1 1 1          SAB TAB Ectopic Multiple Live Births                   Home Medications    Prior to Admission medications   Medication Sig Start Date End Date Taking? Authorizing Provider  b complex vitamins tablet Take 1 tablet by mouth daily.    Yes [provider]  escitalopram (LEXAPRO) 20 MG tablet Take 20 mg by mouth daily.    Yes Rehman, Mechele Dawley, MD  ferrous sulfate 325 (65 FE) MG tablet Take 325 mg by mouth daily with  breakfast.    Yes Rehman, Mechele Dawley, MD  furosemide (LASIX) 40 MG tablet Take 2.5 tablets (100 mg total) by mouth daily. 11/14/15  Yes Kathie Dike, MD  gabapentin (NEURONTIN) 100 MG capsule Take 100-200 mg by mouth at bedtime.    Yes [provider]  insulin glargine (LANTUS) 100 unit/mL SOPN Inject 20 Units into the skin at bedtime.   Yes [provider]  levofloxacin (LEVAQUIN) 250 MG tablet Take 1 tablet (250 mg total) by mouth every other day. 03/22/16  Yes Rehman, Mechele Dawley, MD  Multiple Vitamin (MULTIVITAMIN WITH MINERALS) TABS tablet Take 1 tablet by mouth daily.    Yes [provider]  Oxycodone HCl 10 MG TABS Take 10 mg by mouth every 6 (six) hours as needed (for pain).    Yes [provider]  pantoprazole (PROTONIX) 40 MG tablet Take 1 tablet (40 mg total) by mouth daily before breakfast. 03/22/16  Yes Rehman, Mechele Dawley, MD  potassium chloride  SA (K-DUR,KLOR-CON) 20 MEQ tablet Take 20 mEq by mouth daily.   Yes [provider]  rifaximin (XIFAXAN) 550 MG TABS tablet Take 1 tablet (550 mg total) by mouth 2 (two) times daily. 09/24/15  Yes Rehman, Mechele Dawley, MD  spironolactone (ALDACTONE) 100 MG tablet Take 3 tablets (300 mg total) by mouth daily. 11/16/15  Yes Setzer, Rona Ravens, NP  traZODone (DESYREL) 50 MG tablet Take 1 tablet (50 mg total) by mouth at bedtime. 11/11/15  Yes Thurnell Lose, MD    Family History Family History  Problem Relation Age of Onset  . Dementia Mother   . Heart disease Father   . Liver disease Father   . Hypertension Father   . Diabetes Father   . Dementia Father   . Hypertension Brother   . Other Son        Cervical dystonia    Social History Social History  Substance Use Topics  . Smoking status: Former Smoker    Packs/day: 0.50    Years: 5.00    Types: Cigarettes    Quit date: 05/02/1991  . Smokeless tobacco: Never Used  . Alcohol use No     Allergies   Ancef [cefazolin sodium]; Gadobenate; Iodinated diagnostic agents; and Tape   Review of Systems Review of Systems  Constitutional: Negative for fever.  HENT: Negative for congestion.   Respiratory: Negative for cough and shortness of breath.   Gastrointestinal: Positive for abdominal pain. Negative for blood in stool, nausea and vomiting.       +denies hematochezia and melena  Genitourinary: Negative for difficulty urinating and dysuria.  Neurological: Positive for weakness. Negative for syncope.  Psychiatric/Behavioral: Positive for confusion.  All other systems reviewed and are negative.    Physical Exam Updated Vital Signs BP 112/72   Pulse 80   Temp 97.8 F (36.6 C) (Oral)   Resp 18   Ht 5\' 6"  (1.676 m)   Wt 140 lb (63.5 kg)   SpO2 94%   BMI 22.60 kg/m   Physical Exam Physical Exam  Nursing note and vitals reviewed. Constitutional: Chronically ill-appearing non-toxic, and in no acute distress Head:  Normocephalic and atraumatic.  Mouth/Throat: Oropharynx is clear and moist.  Neck: Normal range of motion. Neck supple.  Cardiovascular: Normal rate and regular rhythm.   Pulmonary/Chest: Effort normal and breath sounds normal.  Abdominal: Soft. There is no rebound and no guarding. Moderate distention, and epigastric tenderness.  Musculoskeletal: Normal range of motion.  Neurological: Alert, no facial  droop, fluent speech, moves all extremities symmetrically, mild asterixis. Skin: Skin is warm and dry.  Psychiatric: Cooperative   ED Treatments / Results   DIAGNOSTIC STUDIES: Oxygen Saturation is 97% on RA, adequate by my interpretation.   COORDINATION OF CARE: 9:04 PM-Discussed next steps with pt. Pt verbalized understanding and is agreeable with the plan.    Labs (all labs ordered are listed, but only abnormal results are displayed) Labs Reviewed  CBC WITH DIFFERENTIAL/PLATELET - Abnormal; Notable for the following:       Result Value   RBC 3.34 (*)    Hemoglobin 9.0 (*)    HCT 27.2 (*)    RDW 16.8 (*)    Platelets 56 (*)    Lymphs Abs 0.5 (*)    All other components within normal limits  COMPREHENSIVE METABOLIC PANEL - Abnormal; Notable for the following:    Sodium 134 (*)    Potassium 3.2 (*)    Chloride 96 (*)    Glucose, Bld 288 (*)    BUN 24 (*)    Creatinine, Ser 1.05 (*)    Calcium 8.5 (*)    Total Protein 6.1 (*)    Albumin 2.8 (*)    GFR calc non Af Amer 59 (*)    All other components within normal limits  PROTIME-INR - Abnormal; Notable for the following:    Prothrombin Time 17.3 (*)    All other components within normal limits  AMMONIA - Abnormal; Notable for the following:    Ammonia 100 (*)    All other components within normal limits  URINE CULTURE  GRAM STAIN  CULTURE, BODY FLUID-BOTTLE  URINALYSIS, ROUTINE W REFLEX MICROSCOPIC  LIPASE, BLOOD  GLUCOSE, SYNOVIAL FLUID  BODY FLUID CELL COUNT WITH DIFFERENTIAL  GLUCOSE, PLEURAL OR PERITONEAL  FLUID    EKG  EKG Interpretation None       Radiology Dg Chest 2 View  Result Date: 06/23/2016 CLINICAL DATA:  Weakness, fatigue, intermittent confusion. History of cirrhosis. EXAM: CHEST  2 VIEW COMPARISON:  Chest radiographs 05/22/2016 FINDINGS: Moderate right pleural effusion and basilar opacity. Left lung is clear. Unchanged heart size and mediastinal contours. No definite pulmonary edema. No pneumothorax. No acute osseous abnormality. IMPRESSION: Moderate right pleural effusion and basilar opacity, likely atelectasis. In a patient with cirrhosis this is likely passive effusion in the setting of intra-abdominal ascites. Electronically Signed   By: Jeb Levering M.D.   On: 06/23/2016 21:38   Ct Head Wo Contrast  Result Date: 06/23/2016 CLINICAL DATA:  Fatigue, weakness, altered mental steps since yesterday. History of cirrhosis. EXAM: CT HEAD WITHOUT CONTRAST TECHNIQUE: Contiguous axial images were obtained from the base of the skull through the vertex without intravenous contrast. COMPARISON:  Head CT 02/23/2007 FINDINGS: Brain: No evidence of acute infarction, hemorrhage, hydrocephalus, extra-axial collection or mass lesion/mass effect. Vascular: Mild atherosclerosis of skullbase vasculature without hyperdense vessel or abnormal calcification. Skull: Normal. Negative for fracture or focal lesion. Sinuses/Orbits: Paranasal sinuses and mastoid air cells are clear. The visualized orbits are unremarkable. Other: None. IMPRESSION: No acute intracranial abnormality. Electronically Signed   By: Jeb Levering M.D.   On: 06/23/2016 21:30    Procedures .Paracentesis Date/Time: 06/23/2016 11:46 PM Performed by: Brantley Stage DUO Authorized by: Brantley Stage DUO   Consent:    Consent obtained:  Verbal   Consent given by:  Patient   Risks discussed:  Bleeding, infection and pain   Alternatives discussed:  No treatment Pre-procedure details:    Procedure purpose:  Diagnostic   Preparation:  Patient was prepped and draped in usual sterile fashion   Anesthesia (see MAR for exact dosages):    Anesthesia method:  Local infiltration   Local anesthetic:  Lidocaine 2% WITH epi Procedure details:    Needle gauge:  18   Ultrasound guidance: yes     Puncture site:  L lower quadrant   Fluid removed amount:  20 mL   Fluid appearance:  Amber   Dressing:  4x4 sterile gauze Post-procedure details:    Patient tolerance of procedure:  Tolerated well, no immediate complications   (including critical care time)  Medications Ordered in ED Medications  lidocaine-EPINEPHrine (XYLOCAINE W/EPI) 2 %-1:200000 (PF) injection 20 mL (20 mLs Infiltration Given by Other 06/23/16 2332)  lactulose (CHRONULAC) 10 GM/15ML solution 30 g (30 g Oral Given 06/23/16 2333)  potassium chloride SA (K-DUR,KLOR-CON) CR tablet 40 mEq (40 mEq Oral Given 06/23/16 2344)     Initial Impression / Assessment and Plan / ED Course  I have reviewed the triage vital signs and the nursing notes.  Pertinent labs & imaging results that were available during my care of the patient were reviewed by me and considered in my medical decision making (see chart for details).     56 year old female with history of hepatitis C cirrhosis who presents with lethargy and altered mental status. My evaluation, patient is alert and oriented, just appearing chronically ill. With mild asterixis 6 on exam. Abdomen is soft and nonsurgical, but with mild upper abdominal tenderness. [name] has been complaining of some intermittent abdominal pain recently paracentesis was performed to rule out SBP. The studies are pending. Chest x-ray shows no evidence of pneumonia or other acute cardiopulmonary processes. UA without infection. CT head visualized and shows no acute intracranial processes. [name] was given a dose of lactulose for headache encephalopathy. Discussed with Dr. Myna Hidalgo who will admit for ongoing management.  Final Clinical Impressions(s) / ED  Diagnoses   Final diagnoses:  Transient alteration of awareness  Hepatic encephalopathy ([place])    New Prescriptions New Prescriptions   No medications on file   I personally performed the services described in this documentation, which was scribed in my presence. The recorded information has been reviewed and is accurate.     Forde Dandy, MD 06/23/16 [phone]

## 2016-06-24 ENCOUNTER — Encounter (HOSPITAL_COMMUNITY): Payer: Self-pay

## 2016-06-24 ENCOUNTER — Telehealth (INDEPENDENT_AMBULATORY_CARE_PROVIDER_SITE_OTHER): Payer: Self-pay | Admitting: *Deleted

## 2016-06-24 DIAGNOSIS — K7460 Unspecified cirrhosis of liver: Secondary | ICD-10-CM | POA: Diagnosis not present

## 2016-06-24 DIAGNOSIS — K7290 Hepatic failure, unspecified without coma: Secondary | ICD-10-CM

## 2016-06-24 DIAGNOSIS — R1084 Generalized abdominal pain: Secondary | ICD-10-CM | POA: Diagnosis not present

## 2016-06-24 DIAGNOSIS — E119 Type 2 diabetes mellitus without complications: Secondary | ICD-10-CM

## 2016-06-24 DIAGNOSIS — Z794 Long term (current) use of insulin: Secondary | ICD-10-CM | POA: Diagnosis not present

## 2016-06-24 DIAGNOSIS — E876 Hypokalemia: Secondary | ICD-10-CM

## 2016-06-24 DIAGNOSIS — D61818 Other pancytopenia: Secondary | ICD-10-CM

## 2016-06-24 DIAGNOSIS — R404 Transient alteration of awareness: Secondary | ICD-10-CM | POA: Diagnosis not present

## 2016-06-24 LAB — GLUCOSE, CAPILLARY
GLUCOSE-CAPILLARY: 190 mg/dL — AB (ref 65–99)
GLUCOSE-CAPILLARY: 83 mg/dL (ref 65–99)
GLUCOSE-CAPILLARY: 213 mg/dL — AB (ref 65–99)
Glucose-Capillary: 406 mg/dL — ABNORMAL HIGH (ref 65–99)
Glucose-Capillary: 412 mg/dL — ABNORMAL HIGH (ref 65–99)
Glucose-Capillary: 209 mg/dL — ABNORMAL HIGH (ref 65–99)
Glucose-Capillary: 228 mg/dL — ABNORMAL HIGH (ref 65–99)

## 2016-06-24 LAB — CBC
HEMATOCRIT: 26.8 % — AB (ref 36.0–46.0)
Hemoglobin: 8.7 g/dL — ABNORMAL LOW (ref 12.0–15.0)
MCH: 26.5 pg (ref 26.0–34.0)
MCHC: 32.5 g/dL (ref 30.0–36.0)
MCV: 81.7 fL (ref 78.0–100.0)
PLATELETS: 55 10*3/uL — AB (ref 150–400)
RBC: 3.28 MIL/uL — AB (ref 3.87–5.11)
RDW: 16.9 % — AB (ref 11.5–15.5)
WBC: 3.9 10*3/uL — ABNORMAL LOW (ref 4.0–10.5)

## 2016-06-24 LAB — COMPREHENSIVE METABOLIC PANEL
ALT: 28 U/L (ref 14–54)
AST: 24 U/L (ref 15–41)
Albumin: 2.7 g/dL — ABNORMAL LOW (ref 3.5–5.0)
Alkaline Phosphatase: 75 U/L (ref 38–126)
Anion gap: 6 (ref 5–15)
BILIRUBIN TOTAL: 1.1 mg/dL (ref 0.3–1.2)
BUN: 22 mg/dL — AB (ref 6–20)
CHLORIDE: 97 mmol/L — AB (ref 101–111)
CO2: 29 mmol/L (ref 22–32)
CREATININE: 1.04 mg/dL — AB (ref 0.44–1.00)
Calcium: 8 mg/dL — ABNORMAL LOW (ref 8.9–10.3)
GFR, EST NON AFRICAN AMERICAN: 60 mL/min — AB (ref >60)
Glucose, Bld: 268 mg/dL — ABNORMAL HIGH (ref 65–99)
POTASSIUM: 2.9 mmol/L — AB (ref 3.5–5.1)
Sodium: 132 mmol/L — ABNORMAL LOW (ref 135–145)
TOTAL PROTEIN: 6 g/dL — AB (ref 6.5–8.1)

## 2016-06-24 LAB — BODY FLUID CELL COUNT WITH DIFFERENTIAL
Lymphs, Fluid: 8 %
Monocyte-Macrophage-Serous Fluid: 84 % (ref 50–90)
NEUTROPHIL FLUID: 8 % (ref 0–25)
WBC FLUID: 186 uL (ref 0–1000)

## 2016-06-24 LAB — GLUCOSE, PLEURAL OR PERITONEAL FLUID: Glucose, Fluid: 255 mg/dL

## 2016-06-24 LAB — GRAM STAIN

## 2016-06-24 LAB — AMMONIA: Ammonia: 78 umol/L — ABNORMAL HIGH (ref 9–35)

## 2016-06-24 MED ORDER — INSULIN ASPART 100 UNIT/ML ~~LOC~~ SOLN
8.0000 [IU] | Freq: Once | SUBCUTANEOUS | Status: AC
  Administered 2016-06-24: 8 [IU] via SUBCUTANEOUS

## 2016-06-24 MED ORDER — SODIUM CHLORIDE 0.9 % IV BOLUS (SEPSIS)
500.0000 mL | Freq: Once | INTRAVENOUS | Status: AC
  Administered 2016-06-24: 500 mL via INTRAVENOUS

## 2016-06-24 MED ORDER — INSULIN ASPART 100 UNIT/ML ~~LOC~~ SOLN
10.0000 [IU] | Freq: Once | SUBCUTANEOUS | Status: AC
  Administered 2016-06-24: 10 [IU] via SUBCUTANEOUS

## 2016-06-24 MED ORDER — POTASSIUM CHLORIDE CRYS ER 20 MEQ PO TBCR
40.0000 meq | EXTENDED_RELEASE_TABLET | Freq: Three times a day (TID) | ORAL | Status: DC
  Administered 2016-06-24: 40 meq via ORAL
  Administered 2016-06-24: 20 meq via ORAL
  Filled 2016-06-24 (×3): qty 2

## 2016-06-24 NOTE — Progress Notes (Signed)
MD notified of Potassium 2.9 and HGB 8.7 via text.

## 2016-06-24 NOTE — Care Management Note (Signed)
Case Management Note  Patient Details  Name: [name] MRN: [id number] Date of Birth: 12/29/1960  Subjective/Objective:   Adm with hepatic encephalopathy. Alert and oriented currently. From home with family. Walks with cane mostly, has RW if needed. Son, POA present at bedside. Patient has had hospice and HH in the past, none currently. Denies need for [facility] at this time. Has PCP, family drives to appointments, reports no issues affording medications. Reports compliance with rifaximin.  Action/Plan: Anticipate DC home with self care. Will follow.  Expected Discharge Date. : 06/25/2016           Expected Discharge Plan:  Home/Self Care  In-House Referral:     Discharge planning Services  CM Consult  Post Acute Care Choice:    Choice offered to:  NA  DME Arranged:    DME Agency:     HH Arranged:    HH Agency:     Status of Service:  In process, will continue to follow  If discussed at Long Length of Stay Meetings, dates discussed:    Additional Comments:  Garland Hincapie, Chauncey Reading, RN 06/24/2016, 10:03 AM

## 2016-06-24 NOTE — Progress Notes (Signed)
Inpatient Diabetes Program Recommendations  AACE/ADA: New Consensus Statement on Inpatient Glycemic Control (2015)  Target Ranges:  Prepandial:   less than 140 mg/dL      Peak postprandial:   less than 180 mg/dL (1-2 hours)      Critically ill patients:  140 - 180 mg/dL   Results for RIVA, SESMA (MRN [id number]) as of 06/24/2016 08:17  Ref. Range 06/24/2016 01:13 06/24/2016 02:45 06/24/2016 05:08 06/24/2016 07:48  Glucose-Capillary Latest Ref Range: 65 - 99 mg/dL 406 (H) 412 (H) 190 (H) 83   Review of Glycemic Control  Diabetes history: DM2 Outpatient Diabetes medications: Lantus 20 units QHS Current orders for Inpatient glycemic control: Lantus 20 units QHs, Novolog 0-15 units TID with meals, Novolog 0-5 units QHS  Inpatient Diabetes Program Recommendations: Diet: Please discontinue Regualar diet and order Carb Modified diet.  Thanks, Barnie Alderman, RN, MSN, CDE Diabetes Coordinator Inpatient Diabetes Program [phone] (Team Pager from 8am to 5pm)

## 2016-06-24 NOTE — Telephone Encounter (Signed)
Message was left that patient fell asleep yesterday and missed appointment, I called patient this morning and left a message stating we would call her back to reschedule due to Dr Laural Golden having a family emergency.

## 2016-06-24 NOTE — Progress Notes (Signed)
PROGRESS NOTE    [name]  XVQ:[id number] DOB: Dec 08, 1960 DOA: 06/23/2016 PCP: Redmond School, MD    Brief Narrative:  [name] is a 56 y.o. female with medical history significant for hepatitis C status post successful treatment, cirrhosis with ascites, depression with anxiety, insulin-dependent diabetes mellitus, and chronic anemia with thrombocytopenia who presents to the emergency department for evaluation of 2 days of confusion, generalized weakness, and intermittent abdominal pain. Patient is accompanied by her family who assists with the history. She reportedly been in her usual state of health until yesterday when she was noted to become generally weak and confused. This waxed and waned throughout the day yesterday, and by this morning it seemed to worsen some. Patient remained confused and weak today, staying in bed. She denies any recent fevers or chills and denies any significant cough, dyspnea, dysuria, or flank pain. There has not been any recent fall or trauma and she denies any recent use of alcohol or illicit substances. Patient also notes some mild intermittent abdominal pain which she describes as generalized, sharp, and without alleviating or exacerbating factors. Patient's family notes that she has developed similar symptoms previously when her ammonia level has been elevated. She has been using lactulose at home, but only as needed for constipation, which she has not had recently. She endorses continued adherence with her rifaximin.   Assessment & Plan:   Principal Problem:   Hepatic encephalopathy (HCC) Active Problems:   Hypokalemia   Diabetes mellitus type 2, controlled, without complications (HCC)   Depression with anxiety   Abdominal pain   Pancytopenia (HCC)   Cirrhosis of liver with ascites (HCC)   Acute hepatic encephalopathy  - Head CT negative for acute intracranial abnormality and no focal neurologic deficits identified - Ammonia level 100 with  asterixis noted - Pt reports continued adherence with rifaximin at home - using lactulose prn constipation and denies any recent constipation (however per Dr. Laural Golden patient is supposed to be taking this daily) - Plan to continue lactulose 30 g TID, titrated to achieve 3 soft BM's/day; continue rifaximin    Cirrhosis with ascites  - Pt with cirrhosis, attributed to hx of hep C which has been treated  - MELD score 11 on admission  - She is known to have varices, not on beta-blocker, possibly d/t soft BP's  - She has hx of SBP and will be continued on Levaquin for ppx  - Continue Lasix and Aldactone, PPI; continue rifaximin and lactulose as above  - patient on transplant list for small bowel and liver  Abdominal pain - Pt has had mild intermittent abd for 2 days leading up to presentation - She has hx of SBP and is managed with Levaquin prophylaxis  - Diagnostic paracentesis performed in ED with studies pending; will follow-up and treat as indicated    Anemia, thrombocytopenia  - Hgb stable at 9.0 and platelets stable at 56k  - No bleeding evident; continue to supplement iron and B-vitamins   Insulin-dependent DM  - A1c was 6.7% in 2015; serum glucose 288 on admission  - Managed at home with Lantus 20 units qHS  - Check CBG with meals and qHS  - Continue Lantus 20 units qHS with moderate-intensity SSI   Hypokalemia - Serum potassium is 3.2 on admission, likely from loop diuretic  - Potassium replacement TID  Depression, anxiety  - Stable  - Continue Lexapro    DVT prophylaxis: SCD's  Code Status: Full  Family Communication: Son  bedside Disposition Plan: Observe on med-surg   Consultants:   None  Procedures:   None  Antimicrobials:   Levaquin    Subjective: Patient seen this am.  Reports she has yet to have a bowel movement.  Mentions her pain is fairly significant.  Denies any nausea.  More alert today per her son.  Objective: Vitals:   06/23/16 2300  06/23/16 2330 06/24/16 0500 06/24/16 1505  BP: 112/72 114/71 (!) 109/56 120/63  Pulse: 80 84 79 85  Resp: 18 17 17 16   Temp:   98.1 F (36.7 C) 98.6 F (37 C)  TempSrc:   Oral Oral  SpO2: 94% 95% 93% 94%  Weight:   65.3 kg (143 lb 14.4 oz)   Height:   5\' 6"  (1.676 m)     Intake/Output Summary (Last 24 hours) at 06/24/16 1813 Last data filed at 06/24/16 1300  Gross per 24 hour  Intake          1080.83 ml  Output              100 ml  Net           980.83 ml   Filed Weights   06/23/16 2038 06/24/16 0500  Weight: 63.5 kg (140 lb) 65.3 kg (143 lb 14.4 oz)    Examination:  General exam: Appears calm and comfortable  Respiratory system: Clear to auscultation. Respiratory effort normal. Cardiovascular system: S1 & S2 heard, RRR. No JVD, murmurs, rubs, gallops or clicks. No pedal edema. Gastrointestinal system: Abdomen is distended, soft and minimally tender in periumbilical area and in right lower quadrant. No organomegaly or masses felt. Normal bowel sounds heard. Central nervous system: Alert and oriented. No focal neurological deficits. Extremities: Symmetric 5 x 5 power. Skin: No rashes, lesions or ulcers Psychiatry: Judgement and insight appear normal. Mood & affect appropriate.     Data Reviewed: I have personally reviewed following labs and imaging studies  CBC:  Recent Labs Lab 06/23/16 2125 06/24/16 0429  WBC 4.9 3.9*  NEUTROABS 4.0  --   HGB 9.0* 8.7*  HCT 27.2* 26.8*  MCV 81.4 81.7  PLT 56* 55*   Basic Metabolic Panel:  Recent Labs Lab 06/23/16 2125 06/24/16 0429  NA 134* 132*  K 3.2* 2.9*  CL 96* 97*  CO2 31 29  GLUCOSE 288* 268*  BUN 24* 22*  CREATININE 1.05* 1.04*  CALCIUM 8.5* 8.0*   GFR: Estimated Creatinine Clearance: 57.9 mL/min (A) (by C-G formula based on SCr of 1.04 mg/dL (H)). Liver Function Tests:  Recent Labs Lab 06/23/16 2125 06/24/16 0429  AST 28 24  ALT 29 28  ALKPHOS 81 75  BILITOT 1.2 1.1  PROT 6.1* 6.0*  ALBUMIN  2.8* 2.7*    Recent Labs Lab 06/23/16 2125  LIPASE 19    Recent Labs Lab 06/23/16 2125 06/24/16 0429  AMMONIA 100* 78*   Coagulation Profile:  Recent Labs Lab 06/23/16 2125  INR 1.40   Cardiac Enzymes: No results for input(s): CKTOTAL, CKMB, CKMBINDEX, TROPONINI in the last 168 hours. BNP (last 3 results) No results for input(s): PROBNP in the last 8760 hours. HbA1C: No results for input(s): HGBA1C in the last 72 hours. CBG:  Recent Labs Lab 06/24/16 0245 06/24/16 0508 06/24/16 0748 06/24/16 1114 06/24/16 1615  GLUCAP 412* 190* 83 213* 209*   Lipid Profile: No results for input(s): CHOL, HDL, LDLCALC, TRIG, CHOLHDL, LDLDIRECT in the last 72 hours. Thyroid Function Tests: No results for input(s): TSH, T4TOTAL, FREET4,  T3FREE, THYROIDAB in the last 72 hours. Anemia Panel: No results for input(s): VITAMINB12, FOLATE, FERRITIN, TIBC, IRON, RETICCTPCT in the last 72 hours. Sepsis Labs: No results for input(s): PROCALCITON, LATICACIDVEN in the last 168 hours.  Recent Results (from the past 240 hour(s))  Gram stain     Status: None   Collection Time: 06/23/16 10:43 PM  Result Value Ref Range Status   Specimen Description PERITONEAL  Final   Special Requests NONE  Final   Gram Stain   Final    ABUNDANT WBC PRESENT, PREDOMINANTLY MONONUCLEAR NO ORGANISMS SEEN    Report Status 06/24/2016 FINAL  Final  Culture, body fluid-bottle     Status: None (Preliminary result)   Collection Time: 06/23/16 10:43 PM  Result Value Ref Range Status   Specimen Description PERITONEAL ABDOMEN  Final   Special Requests   Final    BOTTLES DRAWN AEROBIC ONLY Blood Culture adequate volume   Culture NO GROWTH < 12 HOURS  Final   Report Status PENDING  Incomplete         Radiology Studies: Dg Chest 2 View  Result Date: 06/23/2016 CLINICAL DATA:  Weakness, fatigue, intermittent confusion. History of cirrhosis. EXAM: CHEST  2 VIEW COMPARISON:  Chest radiographs 05/22/2016  FINDINGS: Moderate right pleural effusion and basilar opacity. Left lung is clear. Unchanged heart size and mediastinal contours. No definite pulmonary edema. No pneumothorax. No acute osseous abnormality. IMPRESSION: Moderate right pleural effusion and basilar opacity, likely atelectasis. In a patient with cirrhosis this is likely passive effusion in the setting of intra-abdominal ascites. Electronically Signed   By: Jeb Levering M.D.   On: 06/23/2016 21:38   Ct Head Wo Contrast  Result Date: 06/23/2016 CLINICAL DATA:  Fatigue, weakness, altered mental steps since yesterday. History of cirrhosis. EXAM: CT HEAD WITHOUT CONTRAST TECHNIQUE: Contiguous axial images were obtained from the base of the skull through the vertex without intravenous contrast. COMPARISON:  Head CT 02/23/2007 FINDINGS: Brain: No evidence of acute infarction, hemorrhage, hydrocephalus, extra-axial collection or mass lesion/mass effect. Vascular: Mild atherosclerosis of skullbase vasculature without hyperdense vessel or abnormal calcification. Skull: Normal. Negative for fracture or focal lesion. Sinuses/Orbits: Paranasal sinuses and mastoid air cells are clear. The visualized orbits are unremarkable. Other: None. IMPRESSION: No acute intracranial abnormality. Electronically Signed   By: Jeb Levering M.D.   On: 06/23/2016 21:30        Scheduled Meds: . B-complex with vitamin C  1 tablet Oral Daily  . escitalopram  20 mg Oral Daily  . ferrous sulfate  325 mg Oral Q breakfast  . furosemide  100 mg Oral Daily  . gabapentin  100 mg Oral QHS  . insulin aspart  0-15 Units Subcutaneous TID WC  . insulin aspart  0-5 Units Subcutaneous QHS  . insulin glargine  20 Units Subcutaneous QHS  . lactulose  30 g Oral TID  . levofloxacin  250 mg Oral Q48H  . multivitamin with minerals  1 tablet Oral Daily  . pantoprazole  40 mg Oral QAC breakfast  . potassium chloride SA  20 mEq Oral Daily  . potassium chloride  40 mEq Oral TID    . rifaximin  550 mg Oral BID  . sodium chloride flush  3 mL Intravenous Q12H  . spironolactone  300 mg Oral Daily  . traZODone  50 mg Oral QHS   Continuous Infusions: . sodium chloride       LOS: 0 days    Time spent: 35 minutes  Loretha Stapler, MD [facility] Pager [phone]  If 7PM-7AM, please contact night-coverage [email or web address] Password [facility] 06/24/2016, 6:13 PM

## 2016-06-24 NOTE — Telephone Encounter (Signed)
Forwarded to [facility] to [place]

## 2016-06-24 NOTE — Care Management Obs Status (Signed)
MEDICARE OBSERVATION STATUS NOTIFICATION   Patient Details  Name: [name] MRN: [id number] Date of Birth: 23-Aug-1960   Medicare Observation Status Notification Given:  Yes    Dnaiel Voller, Chauncey Reading, RN 06/24/2016, 8:44 AM

## 2016-06-24 NOTE — Progress Notes (Addendum)
Late Entry:  Went in to administer the patients potassium supplement and she asked if the medication be given by IV vs po.  Voiced to her that I would notify the MD to see if she would change the order.  Spoke with Dr. Adair Patter about the patients request and she stated that the patient should really have the med po if she able to swallow.  Voiced this to the patient she was not very happy about it, but verbalized understanding.  She refused the initial dose while waiting on the MD to change the order or not.

## 2016-06-25 DIAGNOSIS — K7290 Hepatic failure, unspecified without coma: Secondary | ICD-10-CM | POA: Diagnosis not present

## 2016-06-25 DIAGNOSIS — F418 Other specified anxiety disorders: Secondary | ICD-10-CM

## 2016-06-25 DIAGNOSIS — R404 Transient alteration of awareness: Secondary | ICD-10-CM | POA: Diagnosis not present

## 2016-06-25 DIAGNOSIS — R1084 Generalized abdominal pain: Secondary | ICD-10-CM | POA: Diagnosis not present

## 2016-06-25 DIAGNOSIS — K7460 Unspecified cirrhosis of liver: Secondary | ICD-10-CM | POA: Diagnosis not present

## 2016-06-25 LAB — HEMOGLOBIN A1C
HEMOGLOBIN A1C: 6.2 % — AB (ref 4.8–5.6)
Mean Plasma Glucose: 131 mg/dL

## 2016-06-25 LAB — GLUCOSE, CAPILLARY
GLUCOSE-CAPILLARY: 126 mg/dL — AB (ref 65–99)
GLUCOSE-CAPILLARY: 214 mg/dL — AB (ref 65–99)
Glucose-Capillary: 299 mg/dL — ABNORMAL HIGH (ref 65–99)
Glucose-Capillary: 167 mg/dL — ABNORMAL HIGH (ref 65–99)

## 2016-06-25 LAB — BASIC METABOLIC PANEL
Anion gap: 6 (ref 5–15)
BUN: 19 mg/dL (ref 6–20)
CHLORIDE: 97 mmol/L — AB (ref 101–111)
CO2: 29 mmol/L (ref 22–32)
CREATININE: 1.05 mg/dL — AB (ref 0.44–1.00)
Calcium: 7.9 mg/dL — ABNORMAL LOW (ref 8.9–10.3)
GFR, EST NON AFRICAN AMERICAN: 59 mL/min — AB (ref >60)
Glucose, Bld: 143 mg/dL — ABNORMAL HIGH (ref 65–99)
POTASSIUM: 4.3 mmol/L (ref 3.5–5.1)
SODIUM: 132 mmol/L — AB (ref 135–145)

## 2016-06-25 LAB — HIV ANTIBODY (ROUTINE TESTING W REFLEX): HIV SCREEN 4TH GENERATION: NONREACTIVE

## 2016-06-25 LAB — URINE CULTURE: Culture: NO GROWTH

## 2016-06-25 LAB — AMMONIA: AMMONIA: 101 umol/L — AB (ref 9–35)

## 2016-06-25 MED ORDER — FENTANYL CITRATE (PF) 100 MCG/2ML IJ SOLN
25.0000 ug | INTRAMUSCULAR | Status: DC | PRN
  Administered 2016-06-25: 25 ug via INTRAVENOUS
  Filled 2016-06-25 (×2): qty 2

## 2016-06-25 MED ORDER — LACTULOSE 10 GM/15ML PO SOLN
45.0000 g | Freq: Three times a day (TID) | ORAL | Status: DC
  Administered 2016-06-25 – 2016-06-26 (×4): 45 g via ORAL
  Filled 2016-06-25 (×4): qty 90

## 2016-06-25 NOTE — Progress Notes (Signed)
Patient had to med to large brown loose BM's this shift.

## 2016-06-25 NOTE — Progress Notes (Signed)
PROGRESS NOTE    [name]  SJG:[id number] DOB: November 30, 1960 DOA: 06/23/2016 PCP: Redmond School, MD    Brief Narrative:  [name] is a 56 y.o. female with medical history significant for hepatitis C status post successful treatment, cirrhosis with ascites, depression with anxiety, insulin-dependent diabetes mellitus, and chronic anemia with thrombocytopenia who presents to the emergency department for evaluation of 2 days of confusion, generalized weakness, and intermittent abdominal pain. Patient is accompanied by her family who assists with the history. She reportedly been in her usual state of health until yesterday when she was noted to become generally weak and confused. This waxed and waned throughout the day yesterday, and by this morning it seemed to worsen some. Patient remained confused and weak today, staying in bed. She denies any recent fevers or chills and denies any significant cough, dyspnea, dysuria, or flank pain. There has not been any recent fall or trauma and she denies any recent use of alcohol or illicit substances. Patient also notes some mild intermittent abdominal pain which she describes as generalized, sharp, and without alleviating or exacerbating factors. Patient's family notes that she has developed similar symptoms previously when her ammonia level has been elevated. She has been using lactulose at home, but only as needed for constipation, which she has not had recently. She endorses continued adherence with her rifaximin.  Patient seemed to slightly improve on 5/18 but then overnight to 5/19 became confused again.  Ammonia on 5/19 elevated again.  Lactulose increased to 45g TID.   Assessment & Plan:   Principal Problem:   Hepatic encephalopathy (HCC) Active Problems:   Hypokalemia   Diabetes mellitus type 2, controlled, without complications (HCC)   Depression with anxiety   Abdominal pain   Pancytopenia (HCC)   Cirrhosis of liver with ascites  (HCC)   Acute hepatic encephalopathy  - Head CT negative for acute intracranial abnormality and no focal neurologic deficits identified - Increase lactulose to 45g TID, titrated to achieve 3 soft BM's/day - continue rifaximin    Cirrhosis with ascites  - She is known to have varices, not on beta-blocker, possibly d/t soft BP's  - hx of SBP - continue Levaquin for ppx  - Continue Lasix and Aldactone, PPI - rifaximin and lactulose as above  - patient on transplant list for small bowel and liver  Abdominal pain - Pt has had mild intermittent abd for 2 days leading up to presentation - She has hx of SBP and is managed with Levaquin prophylaxis  - add fentanyl PRN for breakthru pain    Anemia, thrombocytopenia  - follow CBCD - blood counts appear stable  Insulin-dependent DM  - Check CBG with meals and qHS  - Continue Lantus 20 units qHS with moderate-intensity SSI  - change to carb modified diet  Hypokalemia - Repleted - K of 4.3 today  Depression, anxiety  - Stable  - Continue Lexapro    DVT prophylaxis: SCD's  Code Status: Full  Family Communication: Son and boyfriend bedside Disposition Plan: anticipate discharge with decrease in ammonia level and improvement in confusion   Consultants:   None  Procedures:   None  Antimicrobials:   Levaquin    Subjective: Patient somewhat confused this morning.  Could not keep days straight.  Says her abdominal pain is stable from yesterday but different from her chronic abdominal pain.  Objective: Vitals:   06/24/16 1505 06/24/16 2115 06/25/16 0615 06/25/16 1300  BP: 120/63 108/60 (!) 104/45 (!) 118/56  Pulse: 85 88 73 79  Resp: 16 18 18 18   Temp: 98.6 F (37 C) 98.6 F (37 C) 98.3 F (36.8 C) 98.2 F (36.8 C)  TempSrc: Oral Oral Oral   SpO2: 94% 95% 93% 95%  Weight:      Height:        Intake/Output Summary (Last 24 hours) at 06/25/16 1608 Last data filed at 06/25/16 1341  Gross per 24 hour   Intake              123 ml  Output                0 ml  Net              123 ml   Filed Weights   06/23/16 2038 06/24/16 0500  Weight: 63.5 kg (140 lb) 65.3 kg (143 lb 14.4 oz)    Examination: General exam: laying down in bed, appears in mild distress Respiratory system: Clear to auscultation. Respiratory effort normal. Cardiovascular system: S1 & S2 heard, RRR. No JVD, murmurs, rubs, gallops or clicks. No pedal edema. Gastrointestinal system: Abdomen is distended, soft and minimally tender in periumbilical area and in right lower quadrant. No organomegaly or masses felt. Normal bowel sounds heard. Central nervous system: Alert and oriented. No focal neurological deficits.  No asterixis noted Extremities: Symmetric 5 x 5 power. Skin: No rashes, lesions or ulcers Psychiatry: Questionable insight and recall noted. Mood & affect appropriate     Data Reviewed: I have personally reviewed following labs and imaging studies  CBC:  Recent Labs Lab 06/23/16 2125 06/24/16 0429  WBC 4.9 3.9*  NEUTROABS 4.0  --   HGB 9.0* 8.7*  HCT 27.2* 26.8*  MCV 81.4 81.7  PLT 56* 55*   Basic Metabolic Panel:  Recent Labs Lab 06/23/16 2125 06/24/16 0429 06/25/16 0553  NA 134* 132* 132*  K 3.2* 2.9* 4.3  CL 96* 97* 97*  CO2 31 29 29   GLUCOSE 288* 268* 143*  BUN 24* 22* 19  CREATININE 1.05* 1.04* 1.05*  CALCIUM 8.5* 8.0* 7.9*   GFR: Estimated Creatinine Clearance: 57.3 mL/min (A) (by C-G formula based on SCr of 1.05 mg/dL (H)). Liver Function Tests:  Recent Labs Lab 06/23/16 2125 06/24/16 0429  AST 28 24  ALT 29 28  ALKPHOS 81 75  BILITOT 1.2 1.1  PROT 6.1* 6.0*  ALBUMIN 2.8* 2.7*    Recent Labs Lab 06/23/16 2125  LIPASE 19    Recent Labs Lab 06/23/16 2125 06/24/16 0429 06/25/16 0553  AMMONIA 100* 78* 101*   Coagulation Profile:  Recent Labs Lab 06/23/16 2125  INR 1.40   Cardiac Enzymes: No results for input(s): CKTOTAL, CKMB, CKMBINDEX, TROPONINI in  the last 168 hours. BNP (last 3 results) No results for input(s): PROBNP in the last 8760 hours. HbA1C:  Recent Labs  06/23/16 2125  HGBA1C 6.2*   CBG:  Recent Labs Lab 06/24/16 1114 06/24/16 1615 06/24/16 2115 06/25/16 0744 06/25/16 1217  GLUCAP 213* 209* 228* 126* 299*   Lipid Profile: No results for input(s): CHOL, HDL, LDLCALC, TRIG, CHOLHDL, LDLDIRECT in the last 72 hours. Thyroid Function Tests: No results for input(s): TSH, T4TOTAL, FREET4, T3FREE, THYROIDAB in the last 72 hours. Anemia Panel: No results for input(s): VITAMINB12, FOLATE, FERRITIN, TIBC, IRON, RETICCTPCT in the last 72 hours. Sepsis Labs: No results for input(s): PROCALCITON, LATICACIDVEN in the last 168 hours.  Recent Results (from the past 240 hour(s))  Urine culture  Status: None   Collection Time: 06/23/16  9:06 PM  Result Value Ref Range Status   Specimen Description URINE, RANDOM  Final   Special Requests NONE  Final   Culture   Final    NO GROWTH Performed at [facility], [address] [place], [place] [address]    Report Status 06/25/2016 FINAL  Final  Gram stain     Status: None   Collection Time: 06/23/16 10:43 PM  Result Value Ref Range Status   Specimen Description PERITONEAL  Final   Special Requests NONE  Final   Gram Stain   Final    ABUNDANT WBC PRESENT, PREDOMINANTLY MONONUCLEAR NO ORGANISMS SEEN    Report Status 06/24/2016 FINAL  Final  Culture, body fluid-bottle     Status: None (Preliminary result)   Collection Time: 06/23/16 10:43 PM  Result Value Ref Range Status   Specimen Description PERITONEAL ABDOMEN  Final   Special Requests   Final    BOTTLES DRAWN AEROBIC ONLY Blood Culture adequate volume   Culture NO GROWTH 2 DAYS  Final   Report Status PENDING  Incomplete         Radiology Studies: Dg Chest 2 View  Result Date: 06/23/2016 CLINICAL DATA:  Weakness, fatigue, intermittent confusion. History of cirrhosis. EXAM: CHEST  2 VIEW COMPARISON:   Chest radiographs 05/22/2016 FINDINGS: Moderate right pleural effusion and basilar opacity. Left lung is clear. Unchanged heart size and mediastinal contours. No definite pulmonary edema. No pneumothorax. No acute osseous abnormality. IMPRESSION: Moderate right pleural effusion and basilar opacity, likely atelectasis. In a patient with cirrhosis this is likely passive effusion in the setting of intra-abdominal ascites. Electronically Signed   By: Jeb Levering M.D.   On: 06/23/2016 21:38   Ct Head Wo Contrast  Result Date: 06/23/2016 CLINICAL DATA:  Fatigue, weakness, altered mental steps since yesterday. History of cirrhosis. EXAM: CT HEAD WITHOUT CONTRAST TECHNIQUE: Contiguous axial images were obtained from the base of the skull through the vertex without intravenous contrast. COMPARISON:  Head CT 02/23/2007 FINDINGS: Brain: No evidence of acute infarction, hemorrhage, hydrocephalus, extra-axial collection or mass lesion/mass effect. Vascular: Mild atherosclerosis of skullbase vasculature without hyperdense vessel or abnormal calcification. Skull: Normal. Negative for fracture or focal lesion. Sinuses/Orbits: Paranasal sinuses and mastoid air cells are clear. The visualized orbits are unremarkable. Other: None. IMPRESSION: No acute intracranial abnormality. Electronically Signed   By: Jeb Levering M.D.   On: 06/23/2016 21:30        Scheduled Meds: . B-complex with vitamin C  1 tablet Oral Daily  . escitalopram  20 mg Oral Daily  . ferrous sulfate  325 mg Oral Q breakfast  . furosemide  100 mg Oral Daily  . gabapentin  100 mg Oral QHS  . insulin aspart  0-15 Units Subcutaneous TID WC  . insulin aspart  0-5 Units Subcutaneous QHS  . insulin glargine  20 Units Subcutaneous QHS  . lactulose  45 g Oral TID  . levofloxacin  250 mg Oral Q48H  . multivitamin with minerals  1 tablet Oral Daily  . pantoprazole  40 mg Oral QAC breakfast  . rifaximin  550 mg Oral BID  . sodium chloride flush   3 mL Intravenous Q12H  . spironolactone  300 mg Oral Daily  . traZODone  50 mg Oral QHS   Continuous Infusions: . sodium chloride       LOS: 0 days    Time spent: 35 minutes    Cristie Hem  Magnus Sinning, MD [facility] Pager [phone]  If 7PM-7AM, please contact night-coverage [email or web address] Password [email or web address] 06/25/2016, 4:08 PM

## 2016-06-26 DIAGNOSIS — K7460 Unspecified cirrhosis of liver: Secondary | ICD-10-CM | POA: Diagnosis not present

## 2016-06-26 DIAGNOSIS — R1084 Generalized abdominal pain: Secondary | ICD-10-CM | POA: Diagnosis not present

## 2016-06-26 DIAGNOSIS — R404 Transient alteration of awareness: Secondary | ICD-10-CM | POA: Diagnosis not present

## 2016-06-26 DIAGNOSIS — K7290 Hepatic failure, unspecified without coma: Secondary | ICD-10-CM | POA: Diagnosis not present

## 2016-06-26 LAB — CBC WITH DIFFERENTIAL/PLATELET
Basophils Absolute: 0 10*3/uL (ref 0.0–0.1)
Basophils Relative: 1 %
EOS PCT: 2 %
Eosinophils Absolute: 0.1 10*3/uL (ref 0.0–0.7)
HCT: 26.1 % — ABNORMAL LOW (ref 36.0–46.0)
HEMOGLOBIN: 8.6 g/dL — AB (ref 12.0–15.0)
LYMPHS ABS: 0.5 10*3/uL — AB (ref 0.7–4.0)
LYMPHS PCT: 12 %
MCH: 26.7 pg (ref 26.0–34.0)
MCHC: 33 g/dL (ref 30.0–36.0)
MCV: 81.1 fL (ref 78.0–100.0)
MONOS PCT: 9 %
Monocytes Absolute: 0.4 10*3/uL (ref 0.1–1.0)
NEUTROS PCT: 76 %
Neutro Abs: 3 10*3/uL (ref 1.7–7.7)
Platelets: 58 10*3/uL — ABNORMAL LOW (ref 150–400)
RBC: 3.22 MIL/uL — AB (ref 3.87–5.11)
RDW: 17.2 % — ABNORMAL HIGH (ref 11.5–15.5)
WBC: 4 10*3/uL (ref 4.0–10.5)

## 2016-06-26 LAB — BASIC METABOLIC PANEL
Anion gap: 7 (ref 5–15)
BUN: 18 mg/dL (ref 6–20)
CHLORIDE: 96 mmol/L — AB (ref 101–111)
CO2: 27 mmol/L (ref 22–32)
CREATININE: 1.02 mg/dL — AB (ref 0.44–1.00)
Calcium: 8.3 mg/dL — ABNORMAL LOW (ref 8.9–10.3)
GFR calc Af Amer: >60 mL/min (ref >60)
GFR calc non Af Amer: >60 mL/min (ref >60)
GLUCOSE: 215 mg/dL — AB (ref 65–99)
POTASSIUM: 3.6 mmol/L (ref 3.5–5.1)
Sodium: 130 mmol/L — ABNORMAL LOW (ref 135–145)

## 2016-06-26 LAB — GLUCOSE, CAPILLARY
GLUCOSE-CAPILLARY: 258 mg/dL — AB (ref 65–99)
Glucose-Capillary: 187 mg/dL — ABNORMAL HIGH (ref 65–99)

## 2016-06-26 LAB — AMMONIA: Ammonia: 61 umol/L — ABNORMAL HIGH (ref 9–35)

## 2016-06-26 MED ORDER — LACTULOSE 10 GM/15ML PO SOLN: 30.0000 g | Freq: Three times a day (TID) | ORAL | 0 refills | Status: DC

## 2016-06-26 NOTE — Discharge Summary (Signed)
Physician Discharge Summary  STEPHENI Russell XTK:[id number] DOB: 1960-09-18 DOA: 06/23/2016  PCP: Redmond School, MD  Admit date: 06/23/2016 Discharge date: 06/26/2016  Admitted From: Home Disposition:   Home  Recommendations for Outpatient Follow-up:  1. Follow up with PCP in 1-2 weeks 2. Follow up with Dr. Laural Golden in 1-2 weeks 3. Lactulose as needed to achieve 3 bowel movements daily   Home Health: No  Equipment/Devices: No   Discharge Condition: Stable CODE STATUS: Full code  Diet recommendation: Heart Healthy / Carb Modified / Regular / Dysphagia   Brief/Interim Summary: [name] a 56 y.o.femalewith medical history significant for hepatitis C status post successful treatment, cirrhosis with ascites, depression with anxiety, insulin-dependent diabetes mellitus, and chronic anemia with thrombocytopenia who presents to the emergency department for evaluation of 2 days of confusion, generalized weakness, and intermittent abdominal pain. Patient is accompanied by her family who assists with the history. She reportedly been in her usual state of health until yesterday when she was noted to become generally weak and confused. This waxed and waned throughout the day yesterday, and by this morning it seemed to worsen some. Patient remained confused and weak today, staying in bed. She denies any recent fevers or chills and denies any significant cough, dyspnea, dysuria, or flank pain. There has not been any recent fall or trauma and she denies any recent use of alcohol or illicit substances. Patient also notes some mild intermittent abdominal pain which she describes as generalized, sharp, and without alleviating or exacerbating factors. Patient's family notes that she has developed similar symptoms previously when her ammonia level has been elevated. She has been using lactulose at home, but only as needed for constipation, which she has not had recently. She endorses continued adherence with her  rifaximin.  Patient seemed to slightly improve on 5/18 but then overnight to 5/19 became confused again.  Ammonia on 5/19 elevated again.  Lactulose increased to 45g TID. Patient had numerous bowel movements and ammonia level decreased.  She was alert and oriented on 06/26/16 and tolerated her diet without any problems.  She voices understanding to return precautions and will follow up with GI and her PCP within 1-2 weeks.  Discharge Diagnoses:  Principal Problem:   Hepatic encephalopathy ([place]) Active Problems:   Hypokalemia   Diabetes mellitus type 2, controlled, without complications (HCC)   Depression with anxiety   Abdominal pain   Pancytopenia (HCC)   Cirrhosis of liver with ascites [facility])    Discharge Instructions  Discharge Instructions    Call MD for:  difficulty breathing, headache or visual disturbances    Complete by:  As directed    Call MD for:  extreme fatigue    Complete by:  As directed    Call MD for:  hives    Complete by:  As directed    Call MD for:  persistant dizziness or light-headedness    Complete by:  As directed    Call MD for:  persistant nausea and vomiting    Complete by:  As directed    Call MD for:  severe uncontrolled pain    Complete by:  As directed    Call MD for:  temperature >100.4    Complete by:  As directed    Diet - low sodium heart healthy    Complete by:  As directed    Discharge instructions    Complete by:  As directed    Take lactulose to achieve 3 bowel movements daily  Follow up with Dr. Laural Golden this week See your primary care physician within 1-2 weeks   Increase activity slowly    Complete by:  As directed      Allergies as of 06/26/2016      Reactions   Ancef [cefazolin Sodium] Other (See Comments)   Reaction:  Vaginal and mouth blisters   Gadobenate Itching, Nausea And Vomiting, Rash   Iodinated Diagnostic Agents Itching, Nausea And Vomiting, Rash   Tape Rash      Medication List    TAKE these medications   b  complex vitamins tablet Take 1 tablet by mouth daily.   escitalopram 20 MG tablet Commonly known as:  LEXAPRO Take 20 mg by mouth daily.   ferrous sulfate 325 (65 FE) MG tablet Take 325 mg by mouth daily with breakfast.   furosemide 40 MG tablet Commonly known as:  LASIX Take 2.5 tablets (100 mg total) by mouth daily.   gabapentin 100 MG capsule Commonly known as:  NEURONTIN Take 100-200 mg by mouth at bedtime.   insulin glargine 100 unit/mL Sopn Commonly known as:  LANTUS Inject 20 Units into the skin at bedtime.   lactulose 10 GM/15ML solution Commonly known as:  CHRONULAC Take 45 mLs (30 g total) by mouth 3 (three) times daily. To achieve 3 bowel movements a day   levofloxacin 250 MG tablet Commonly known as:  LEVAQUIN Take 1 tablet (250 mg total) by mouth every other day.   multivitamin with minerals Tabs tablet Take 1 tablet by mouth daily.   Oxycodone HCl 10 MG Tabs Take 10 mg by mouth every 6 (six) hours as needed (for pain).   pantoprazole 40 MG tablet Commonly known as:  PROTONIX Take 1 tablet (40 mg total) by mouth daily before breakfast.   potassium chloride SA 20 MEQ tablet Commonly known as:  K-DUR,KLOR-CON Take 20 mEq by mouth daily.   rifaximin 550 MG Tabs tablet Commonly known as:  XIFAXAN Take 1 tablet (550 mg total) by mouth 2 (two) times daily.   spironolactone 100 MG tablet Commonly known as:  ALDACTONE Take 3 tablets (300 mg total) by mouth daily.   traZODone 50 MG tablet Commonly known as:  DESYREL Take 1 tablet (50 mg total) by mouth at bedtime.      Follow-up Information    Redmond School, MD. Schedule an appointment as soon as possible for a visit in 1 week(s).   Specialty:  Internal Medicine Contact information: [address] [place] [address] [phone]        Rogene Houston, MD. Schedule an appointment as soon as possible for a visit in 1 week(s).   Specialty:  Gastroenterology Contact information: [address], [address] [place] [address] [phone]          Allergies  Allergen Reactions  . Ancef [Cefazolin Sodium] Other (See Comments)    Reaction:  Vaginal and mouth blisters  . Gadobenate Itching, Nausea And Vomiting and Rash  . Iodinated Diagnostic Agents Itching, Nausea And Vomiting and Rash  . Tape Rash    Consultations:  Diabetes Coordinator  CM   Procedures/Studies: Dg Chest 2 View  Result Date: 06/23/2016 CLINICAL DATA:  Weakness, fatigue, intermittent confusion. History of cirrhosis. EXAM: CHEST  2 VIEW COMPARISON:  Chest radiographs 05/22/2016 FINDINGS: Moderate right pleural effusion and basilar opacity. Left lung is clear. Unchanged heart size and mediastinal contours. No definite pulmonary edema. No pneumothorax. No acute osseous abnormality. IMPRESSION: Moderate right pleural effusion  and basilar opacity, likely atelectasis. In a patient with cirrhosis this is likely passive effusion in the setting of intra-abdominal ascites. Electronically Signed   By: Jeb Levering M.D.   On: 06/23/2016 21:38   Ct Head Wo Contrast  Result Date: 06/23/2016 CLINICAL DATA:  Fatigue, weakness, altered mental steps since yesterday. History of cirrhosis. EXAM: CT HEAD WITHOUT CONTRAST TECHNIQUE: Contiguous axial images were obtained from the base of the skull through the vertex without intravenous contrast. COMPARISON:  Head CT 02/23/2007 FINDINGS: Brain: No evidence of acute infarction, hemorrhage, hydrocephalus, extra-axial collection or mass lesion/mass effect. Vascular: Mild atherosclerosis of skullbase vasculature without hyperdense vessel or abnormal calcification. Skull: Normal. Negative for fracture or focal lesion. Sinuses/Orbits: Paranasal sinuses and mastoid air cells are clear. The visualized orbits are unremarkable. Other: None. IMPRESSION: No acute intracranial abnormality. Electronically Signed   By: Jeb Levering M.D.   On: 06/23/2016 21:30      Subjective: Patient feeling well today.  She just returned from the ED from seeing her mother.  She states that she feels more oriented today.  Still having some abdominal pain but voices the oxycodone manages this fairly well.  Discharge Exam: Vitals:   06/25/16 2145 06/26/16 0300  BP: (!) 119/57 125/74  Pulse: 78 76  Resp: 18 19  Temp: 98.4 F (36.9 C) 98.3 F (36.8 C)   Vitals:   06/25/16 0615 06/25/16 1300 06/25/16 2145 06/26/16 0300  BP: (!) 104/45 (!) 118/56 (!) 119/57 125/74  Pulse: 73 79 78 76  Resp: 18 18 18 19   Temp: 98.3 F (36.8 C) 98.2 F (36.8 C) 98.4 F (36.9 C) 98.3 F (36.8 C)  TempSrc: Oral  Oral Oral  SpO2: 93% 95% 96% 95%  Weight:    67.2 kg (148 lb 1.6 oz)  Height:        General: Pt is alert, awake, not in acute distress Cardiovascular: RRR, S1/S2 +, no rubs, no gallops Respiratory: CTA bilaterally, no wheezing, no rhonchi Abdominal: Soft, NT, slightly distended, bowel sounds + Extremities: no edema, no cyanosis    The results of significant diagnostics from this hospitalization (including imaging, microbiology, ancillary and laboratory) are listed below for reference.     Microbiology: Recent Results (from the past 240 hour(s))  Urine culture     Status: None   Collection Time: 06/23/16  9:06 PM  Result Value Ref Range Status   Specimen Description URINE, RANDOM  Final   Special Requests NONE  Final   Culture   Final    NO GROWTH Performed at [facility], [address], [place], [place] [address]    Report Status 06/25/2016 FINAL  Final  Gram stain     Status: None   Collection Time: 06/23/16 10:43 PM  Result Value Ref Range Status   Specimen Description PERITONEAL  Final   Special Requests NONE  Final   Gram Stain   Final    ABUNDANT WBC PRESENT, PREDOMINANTLY MONONUCLEAR NO ORGANISMS SEEN    Report Status 06/24/2016 FINAL  Final  Culture, body fluid-bottle     Status: None (Preliminary result)   Collection Time:  06/23/16 10:43 PM  Result Value Ref Range Status   Specimen Description PERITONEAL ABDOMEN  Final   Special Requests   Final    BOTTLES DRAWN AEROBIC ONLY Blood Culture adequate volume   Culture NO GROWTH 2 DAYS  Final   Report Status PENDING  Incomplete     Labs: BNP (last 3 results) No  results for input(s): BNP in the last 8760 hours. Basic Metabolic Panel:  Recent Labs Lab 06/23/16 2125 06/24/16 0429 06/25/16 0553 06/26/16 0539  NA 134* 132* 132* 130*  K 3.2* 2.9* 4.3 3.6  CL 96* 97* 97* 96*  CO2 31 29 29 27   GLUCOSE 288* 268* 143* 215*  BUN 24* 22* 19 18  CREATININE 1.05* 1.04* 1.05* 1.02*  CALCIUM 8.5* 8.0* 7.9* 8.3*   Liver Function Tests:  Recent Labs Lab 06/23/16 2125 06/24/16 0429  AST 28 24  ALT 29 28  ALKPHOS 81 75  BILITOT 1.2 1.1  PROT 6.1* 6.0*  ALBUMIN 2.8* 2.7*    Recent Labs Lab 06/23/16 2125  LIPASE 19    Recent Labs Lab 06/23/16 2125 06/24/16 0429 06/25/16 0553 06/26/16 0539  AMMONIA 100* 78* 101* 61*   CBC:  Recent Labs Lab 06/23/16 2125 06/24/16 0429 06/26/16 0539  WBC 4.9 3.9* 4.0  NEUTROABS 4.0  --  3.0  HGB 9.0* 8.7* 8.6*  HCT 27.2* 26.8* 26.1*  MCV 81.4 81.7 81.1  PLT 56* 55* 58*   Cardiac Enzymes: No results for input(s): CKTOTAL, CKMB, CKMBINDEX, TROPONINI in the last 168 hours. BNP: Invalid input(s): POCBNP CBG:  Recent Labs Lab 06/25/16 1217 06/25/16 1656 06/25/16 2200 06/26/16 0744 06/26/16 1118  GLUCAP 299* 214* 167* 187* 258*   D-Dimer No results for input(s): DDIMER in the last 72 hours. Hgb A1c  Recent Labs  06/23/16 2125  HGBA1C 6.2*   Lipid Profile No results for input(s): CHOL, HDL, LDLCALC, TRIG, CHOLHDL, LDLDIRECT in the last 72 hours. Thyroid function studies No results for input(s): TSH, T4TOTAL, T3FREE, THYROIDAB in the last 72 hours.  Invalid input(s): FREET3 Anemia work up No results for input(s): VITAMINB12, FOLATE, FERRITIN, TIBC, IRON, RETICCTPCT in the last 72  hours. Urinalysis    Component Value Date/Time   COLORURINE YELLOW 06/23/2016 2106   APPEARANCEUR CLEAR 06/23/2016 2106   LABSPEC 1.017 06/23/2016 2106   PHURINE 5.0 06/23/2016 2106   GLUCOSEU NEGATIVE 06/23/2016 2106   HGBUR NEGATIVE 06/23/2016 2106   BILIRUBINUR NEGATIVE 06/23/2016 2106   KETONESUR NEGATIVE 06/23/2016 2106   PROTEINUR NEGATIVE 06/23/2016 2106   UROBILINOGEN 0.2 05/28/2013 0206   NITRITE NEGATIVE 06/23/2016 2106   LEUKOCYTESUR NEGATIVE 06/23/2016 2106   Sepsis Labs Invalid input(s): PROCALCITONIN,  WBC,  LACTICIDVEN Microbiology Recent Results (from the past 240 hour(s))  Urine culture     Status: None   Collection Time: 06/23/16  9:06 PM  Result Value Ref Range Status   Specimen Description URINE, RANDOM  Final   Special Requests NONE  Final   Culture   Final    NO GROWTH Performed at [facility], [place] [address], [place], [place] [address]    Report Status 06/25/2016 FINAL  Final  Gram stain     Status: None   Collection Time: 06/23/16 10:43 PM  Result Value Ref Range Status   Specimen Description PERITONEAL  Final   Special Requests NONE  Final   Gram Stain   Final    ABUNDANT WBC PRESENT, PREDOMINANTLY MONONUCLEAR NO ORGANISMS SEEN    Report Status 06/24/2016 FINAL  Final  Culture, body fluid-bottle     Status: None (Preliminary result)   Collection Time: 06/23/16 10:43 PM  Result Value Ref Range Status   Specimen Description PERITONEAL ABDOMEN  Final   Special Requests   Final    BOTTLES DRAWN AEROBIC ONLY Blood Culture adequate volume   Culture NO GROWTH 2 DAYS  Final   Report Status PENDING  Incomplete     Time coordinating discharge: 40 minutes  SIGNED:   Loretha Stapler, MD  [facility] 06/26/2016, 1:53 PM Pager [phone] If 7PM-7AM, please contact night-coverage [email or web address] Password [email or web address]

## 2016-06-26 NOTE — Final Progress Note (Signed)
Patient discharged with instructions, prescription, and care notes.  Verbalized understanding via teach back.  IV was removed and the site was WNL. Patient voiced no further complaints or concerns at the time of discharge.  Appointments scheduled per instructions.  Patient left the floor via w/c family  And staff in stable condition. 

## 2016-06-27 LAB — PATHOLOGIST SMEAR REVIEW

## 2016-06-28 ENCOUNTER — Other Ambulatory Visit (INDEPENDENT_AMBULATORY_CARE_PROVIDER_SITE_OTHER): Payer: Self-pay | Admitting: Internal Medicine

## 2016-06-28 LAB — CULTURE, BODY FLUID W GRAM STAIN -BOTTLE
Culture: NO GROWTH
Special Requests: ADEQUATE

## 2016-06-28 LAB — CULTURE, BODY FLUID-BOTTLE

## 2016-06-30 NOTE — Telephone Encounter (Signed)
A message has been sent to Reba to see when we can reschedule this patient to come in.

## 2016-07-01 ENCOUNTER — Emergency Department (HOSPITAL_COMMUNITY): Payer: Medicare Other

## 2016-07-01 ENCOUNTER — Inpatient Hospital Stay (HOSPITAL_COMMUNITY)
Admission: EM | Admit: 2016-07-01 | Discharge: 2016-07-04 | DRG: 391 | Disposition: A | Discharge status: Home / Self Care | Payer: Medicare Other | Attending: Family Medicine | Admitting: Family Medicine

## 2016-07-01 ENCOUNTER — Encounter (HOSPITAL_COMMUNITY): Payer: Self-pay | Admitting: Emergency Medicine

## 2016-07-01 DIAGNOSIS — B182 Chronic viral hepatitis C: Secondary | ICD-10-CM | POA: Diagnosis not present

## 2016-07-01 DIAGNOSIS — N200 Calculus of kidney: Secondary | ICD-10-CM | POA: Diagnosis present

## 2016-07-01 DIAGNOSIS — Z881 Allergy status to other antibiotic agents status: Secondary | ICD-10-CM

## 2016-07-01 DIAGNOSIS — Z8711 Personal history of peptic ulcer disease: Secondary | ICD-10-CM

## 2016-07-01 DIAGNOSIS — K7290 Hepatic failure, unspecified without coma: Secondary | ICD-10-CM | POA: Diagnosis present

## 2016-07-01 DIAGNOSIS — K652 Spontaneous bacterial peritonitis: Secondary | ICD-10-CM | POA: Diagnosis present

## 2016-07-01 DIAGNOSIS — Z8249 Family history of ischemic heart disease and other diseases of the circulatory system: Secondary | ICD-10-CM

## 2016-07-01 DIAGNOSIS — K7460 Unspecified cirrhosis of liver: Secondary | ICD-10-CM | POA: Diagnosis present

## 2016-07-01 DIAGNOSIS — Z91041 Radiographic dye allergy status: Secondary | ICD-10-CM

## 2016-07-01 DIAGNOSIS — K766 Portal hypertension: Secondary | ICD-10-CM | POA: Diagnosis present

## 2016-07-01 DIAGNOSIS — Z7682 Awaiting organ transplant status: Secondary | ICD-10-CM | POA: Diagnosis not present

## 2016-07-01 DIAGNOSIS — R1084 Generalized abdominal pain: Secondary | ICD-10-CM | POA: Diagnosis not present

## 2016-07-01 DIAGNOSIS — Z79891 Long term (current) use of opiate analgesic: Secondary | ICD-10-CM

## 2016-07-01 DIAGNOSIS — Z9049 Acquired absence of other specified parts of digestive tract: Secondary | ICD-10-CM

## 2016-07-01 DIAGNOSIS — Z9114 Patient's other noncompliance with medication regimen: Secondary | ICD-10-CM

## 2016-07-01 DIAGNOSIS — K5900 Constipation, unspecified: Secondary | ICD-10-CM | POA: Diagnosis not present

## 2016-07-01 DIAGNOSIS — Z9884 Bariatric surgery status: Secondary | ICD-10-CM

## 2016-07-01 DIAGNOSIS — R109 Unspecified abdominal pain: Secondary | ICD-10-CM | POA: Diagnosis present

## 2016-07-01 DIAGNOSIS — Z794 Long term (current) use of insulin: Secondary | ICD-10-CM

## 2016-07-01 DIAGNOSIS — Z8619 Personal history of other infectious and parasitic diseases: Secondary | ICD-10-CM | POA: Diagnosis not present

## 2016-07-01 DIAGNOSIS — Z87442 Personal history of urinary calculi: Secondary | ICD-10-CM

## 2016-07-01 DIAGNOSIS — K259 Gastric ulcer, unspecified as acute or chronic, without hemorrhage or perforation: Secondary | ICD-10-CM | POA: Diagnosis present

## 2016-07-01 DIAGNOSIS — Z79899 Other long term (current) drug therapy: Secondary | ICD-10-CM

## 2016-07-01 DIAGNOSIS — K7031 Alcoholic cirrhosis of liver with ascites: Secondary | ICD-10-CM | POA: Diagnosis present

## 2016-07-01 DIAGNOSIS — Z9109 Other allergy status, other than to drugs and biological substances: Secondary | ICD-10-CM

## 2016-07-01 DIAGNOSIS — K7682 Hepatic encephalopathy: Secondary | ICD-10-CM | POA: Diagnosis present

## 2016-07-01 DIAGNOSIS — E119 Type 2 diabetes mellitus without complications: Secondary | ICD-10-CM

## 2016-07-01 DIAGNOSIS — Z90710 Acquired absence of both cervix and uterus: Secondary | ICD-10-CM

## 2016-07-01 DIAGNOSIS — F419 Anxiety disorder, unspecified: Secondary | ICD-10-CM | POA: Diagnosis present

## 2016-07-01 DIAGNOSIS — F329 Major depressive disorder, single episode, unspecified: Secondary | ICD-10-CM | POA: Diagnosis present

## 2016-07-01 DIAGNOSIS — Z833 Family history of diabetes mellitus: Secondary | ICD-10-CM

## 2016-07-01 LAB — COMPREHENSIVE METABOLIC PANEL
ALK PHOS: 95 U/L (ref 38–126)
ALT: 32 U/L (ref 14–54)
ANION GAP: 9 (ref 5–15)
AST: 35 U/L (ref 15–41)
Albumin: 3 g/dL — ABNORMAL LOW (ref 3.5–5.0)
BILIRUBIN TOTAL: 1 mg/dL (ref 0.3–1.2)
BUN: 22 mg/dL — ABNORMAL HIGH (ref 6–20)
CO2: 27 mmol/L (ref 22–32)
Calcium: 8.2 mg/dL — ABNORMAL LOW (ref 8.9–10.3)
Chloride: 97 mmol/L — ABNORMAL LOW (ref 101–111)
Creatinine, Ser: 1.28 mg/dL — ABNORMAL HIGH (ref 0.44–1.00)
GFR, EST AFRICAN AMERICAN: 54 mL/min — AB (ref >60)
GFR, EST NON AFRICAN AMERICAN: 47 mL/min — AB (ref >60)
Glucose, Bld: 257 mg/dL — ABNORMAL HIGH (ref 65–99)
Potassium: 3.4 mmol/L — ABNORMAL LOW (ref 3.5–5.1)
Sodium: 133 mmol/L — ABNORMAL LOW (ref 135–145)
TOTAL PROTEIN: 6.7 g/dL (ref 6.5–8.1)

## 2016-07-01 LAB — CBC
HCT: 28.8 % — ABNORMAL LOW (ref 36.0–46.0)
HEMOGLOBIN: 9.4 g/dL — AB (ref 12.0–15.0)
MCH: 26.5 pg (ref 26.0–34.0)
MCHC: 32.6 g/dL (ref 30.0–36.0)
MCV: 81.1 fL (ref 78.0–100.0)
Platelets: 68 10*3/uL — ABNORMAL LOW (ref 150–400)
RBC: 3.55 MIL/uL — AB (ref 3.87–5.11)
RDW: 17 % — ABNORMAL HIGH (ref 11.5–15.5)
WBC: 4.8 10*3/uL (ref 4.0–10.5)

## 2016-07-01 LAB — AMMONIA: AMMONIA: 78 umol/L — AB (ref 9–35)

## 2016-07-01 LAB — LIPASE, BLOOD: Lipase: 25 U/L (ref 11–51)

## 2016-07-01 MED ORDER — ONDANSETRON HCL 4 MG/2ML IJ SOLN
4.0000 mg | Freq: Once | INTRAMUSCULAR | Status: AC
  Administered 2016-07-01: 4 mg via INTRAVENOUS
  Filled 2016-07-01: qty 2

## 2016-07-01 MED ORDER — HYDROMORPHONE HCL 1 MG/ML IJ SOLN
1.0000 mg | Freq: Once | INTRAMUSCULAR | Status: AC
  Administered 2016-07-01: 1 mg via INTRAVENOUS
  Filled 2016-07-01: qty 1

## 2016-07-01 NOTE — ED Provider Notes (Signed)
[facility] DEPT Provider Note   CSN: [id number] Arrival date & time: 07/01/16  2155   By signing my name below, I, Mayer Masker, attest that this documentation has been prepared under the direction and in the presence of Orvan Falconer, MD. Electronically Signed: Mayer Masker, Scribe. 07/02/16. 4:32 AM.  History   Chief Complaint Chief Complaint  Patient presents with  . Abdominal Pain    pt is on liver transplant list   The history is provided by the patient. No language interpreter was used.    HPI Comments: [name] is a 56 y.o. female with a PMHx of liver cirrhosis with ascites who presents to the Emergency Department complaining of constant, gradually worsening abdominal pain since 2 days ago. She was previously admitted for this issue a week ago but the pain has not improved at all, so she decided to come back. She notes the last time she had fluid taken out was April 17 and she normally gets this done weekly. She states she has had previous infections in her fluid, but these symptoms do not feel like her previous infections. She denies fever.   Past Medical History:  Diagnosis Date  . Anemia   . Anxiety   . Ascites   . Cirrhosis ([place])    non alcoholic  . Complication of anesthesia   . Depression   . Diabetes mellitus type 2, controlled, without complications ([place])   . Enteropathy 07/31/2015   Portal hypertensive enteropathy per capsule study, with stigmata of bleeding.  . Esophageal varices ([place])   . Gastric ulcer 07/15/2015   per EGD; no stigmata of bleeding  . GI bleed 07/17/2015  . Gram-negative bacteremia 12/08/2011  . ZCHYIFOY(774.1)    "monthly" (11/07/2012)  . Heart murmur   . Hepatitis C    Hx: of Hep "C" it was eradicated  . History of hepatitis C - successfuly treated medically 07/14/2015  . Kidney stones   . Liver failure ([place])    "I'm on liver transplant list @ [facility]" (11/07/2012)  . PONV (postoperative nausea and vomiting)   . Portal vein thrombosis     . Renal insufficiency   . Right ureteral stone 12/07/2011  . 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 Type II diabetes mellitus ([place])    "not since gastric bypass" (11/07/2012)    Patient Active Problem List   Diagnosis Date Noted  . Pancytopenia ([place]) 11/13/2015  . Cirrhosis of liver with ascites ([place])   . Abdominal pain 11/10/2015  . Alcoholic cirrhosis of liver with ascites ([place])   . Enteropathy 07/31/2015  . Anemia, blood loss   . Depression with anxiety 07/30/2015  . Esophageal varices in cirrhosis ([place]) 07/30/2015  . Gastritis determined by endoscopy 07/30/2015  . Hypotension 07/30/2015  . Hyponatremia 07/29/2015  . Hypokalemia 07/29/2015  . GI bleeding 07/29/2015  . Gastric ulcer 07/29/2015  . Leukopenia 07/29/2015  . AKI (acute kidney injury) ([place]) 07/29/2015  . Diabetes mellitus type 2, controlled, without complications ([place]) 28/78/6767  . GI bleed 07/17/2015  . Palliative care encounter   . Goals of care, counseling/discussion   . DNR (do not resuscitate) discussion   . 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 Melena 07/14/2015  . Hepatic cirrhosis due to chronic hepatitis C infection ([place]) 07/14/2015  . History of hepatitis C - successfuly treated medically 07/14/2015  . Portal vein thrombosis 05/13/2014  . Other malaise and fatigue 08/05/2013  . Hypoxemia 08/05/2013  . Abdominal pain, right upper quadrant  05/29/2013  . SBP (spontaneous bacterial peritonitis) ([place]) 05/28/2013  . Distal radius fracture 10/25/2012  . Metatarsal bone fracture 10/25/2012  . Insomnia 09/25/2012  . Hepatic encephalopathy ([place]) 06/07/2012  . Hyperglycemia 06/06/2012  . LUQ abdominal pain 06/05/2012  . Ascites 05/14/2012  . Gram-negative bacteremia 12/08/2011  . Pyelonephritis 12/07/2011  . Hydronephrosis of right kidney 12/07/2011  . Right ureteral stone 12/07/2011  . Thrombocytopenia ([place]) 12/07/2011  . Cirrhosis of liver ([place]) 05/02/2011  . Acute blood loss  anemia 05/02/2011    Past Surgical History:  Procedure Laterality Date  . ABDOMINAL HYSTERECTOMY  2003  . CESAREAN SECTION  1995  . CHOLECYSTECTOMY  1987  . COLONOSCOPY    . COLONOSCOPY  10/21/2011   Procedure: COLONOSCOPY;  Surgeon: Rogene Houston, MD;  Location: [facility];  Service: Endoscopy;  Laterality: N/A;  245   . CYSTOSCOPY W/ RETROGRADES  12/08/2011   Procedure: CYSTOSCOPY WITH RETROGRADE PYELOGRAM;  Surgeon: Marissa Nestle, MD;  Location: [facility] ORS;  Service: Urology;  Laterality: Right;  . CYSTOSCOPY WITH STENT PLACEMENT  12/08/2011   Procedure: CYSTOSCOPY WITH STENT PLACEMENT;  Surgeon: Marissa Nestle, MD;  Location: [facility];  Service: Urology;  Laterality: Right;  . DILATION AND CURETTAGE OF UTERUS    . ESOPHAGOGASTRODUODENOSCOPY N/A 02/19/2014   Procedure: ESOPHAGOGASTRODUODENOSCOPY (EGD);  Surgeon: Rogene Houston, MD;  Location: [facility];  Service: Endoscopy;  Laterality: N/A;  100  . ESOPHAGOGASTRODUODENOSCOPY N/A 07/15/2015   Procedure: ESOPHAGOGASTRODUODENOSCOPY (EGD);  Surgeon: Rogene Houston, MD;  [facility];  Service: Endoscopy;  Laterality: N/A;  . ESOPHAGOGASTRODUODENOSCOPY N/A 07/29/2015   Procedure: ESOPHAGOGASTRODUODENOSCOPY (EGD);  Surgeon: Rogene Houston, MD;  [facility];  Service: Endoscopy;  Laterality: N/A;  . GASTRIC BYPASS  ~ 1995  . HERNIA REPAIR  04/14/11   "one in my bellybutton" (11/07/2012)  . INGUINAL HERNIA REPAIR Right    "maybe 2" (11/07/2012)  . KNEE ARTHROSCOPY Right   . LITHOTRIPSY     "several times" (11/07/2012)  . OPEN REDUCTION INTERNAL FIXATION (ORIF) DISTAL RADIAL FRACTURE Right 11/07/2012   Procedure: OPEN REDUCTION INTERNAL FIXATION (ORIF) RIGHT DISTAL RADIAL FRACTURE;  Surgeon: [name], MD;  Location: [facility];  Service: Orthopedics;  Laterality: Right;  . ORIF DISTAL RADIUS FRACTURE Right 11/07/2012  . UPPER GASTROINTESTINAL ENDOSCOPY      OB History    Gravida Para Term Preterm AB Living     1 1 1          SAB TAB Ectopic Multiple Live Births                   Home Medications    Prior to Admission medications   Medication Sig Start Date End Date Taking? Authorizing Provider  b complex vitamins tablet Take 1 tablet by mouth daily.    Yes [provider]  escitalopram (LEXAPRO) 20 MG tablet Take 20 mg by mouth daily.    Yes Rehman, Mechele Dawley, MD  ferrous sulfate 325 (65 FE) MG tablet Take 325 mg by mouth daily with breakfast.    Yes Rehman, Najeeb U, MD  furosemide (LASIX) 40 MG tablet Take 2.5 tablets (100 mg total) by mouth daily. 11/14/15  Yes Kathie Dike, MD  gabapentin (NEURONTIN) 100 MG capsule Take 100-200 mg by mouth at bedtime.    Yes [provider]  insulin glargine (LANTUS) 100 unit/mL SOPN Inject 20 Units into the skin at bedtime.   Yes [provider]  lactulose (CHRONULAC) 10 GM/15ML solution Take 45 mLs (30 g total) by mouth 3 (three) times daily. To achieve 3 bowel movements a day 06/26/16  Yes Eber Jones, MD  levofloxacin (LEVAQUIN) 250 MG tablet Take 1 tablet (250 mg total) by mouth every other day. 03/22/16  Yes Rehman, Mechele Dawley, MD  metolazone (ZAROXOLYN) 2.5 MG tablet TAKE 1 TABLET EVERY 4TH DAY. 06/29/16  Yes Rehman, Mechele Dawley, MD  Multiple Vitamin (MULTIVITAMIN WITH MINERALS) TABS tablet Take 1 tablet by mouth daily.    Yes [provider]  Oxycodone HCl 10 MG TABS Take 10 mg by mouth every 6 (six) hours as needed (for pain).    Yes [provider]  pantoprazole (PROTONIX) 40 MG tablet Take 1 tablet (40 mg total) by mouth daily before breakfast. 03/22/16  Yes Rehman, Mechele Dawley, MD  potassium chloride SA (K-DUR,KLOR-CON) 20 MEQ tablet Take 20 mEq by mouth daily.   Yes [provider]  rifaximin (XIFAXAN) 550 MG TABS tablet Take 1 tablet (550 mg total) by mouth 2 (two) times daily. 09/24/15  Yes Rehman, Mechele Dawley, MD  spironolactone (ALDACTONE) 100 MG tablet Take 3 tablets (300 mg total) by mouth  daily. 11/16/15  Yes Setzer, Rona Ravens, NP  traZODone (DESYREL) 50 MG tablet Take 1 tablet (50 mg total) by mouth at bedtime. 11/11/15  Yes Thurnell Lose, MD    Family History Family History  Problem Relation Age of Onset  . Dementia Mother   . Heart disease Father   . Liver disease Father   . Hypertension Father   . Diabetes Father   . Dementia Father   . Hypertension Brother   . Other Son        Cervical dystonia    Social History Social History  Substance Use Topics  . Smoking status: Former Smoker    Packs/day: 0.50    Years: 5.00    Types: Cigarettes    Quit date: 05/02/1991  . Smokeless tobacco: Never Used  . Alcohol use No     Allergies   Ancef [cefazolin sodium]; Gadobenate; Iodinated diagnostic agents; and Tape   Review of Systems Review of Systems  Constitutional: Negative for fever.  Gastrointestinal: Positive for abdominal pain.  All other systems reviewed and are negative.    Physical Exam Updated Vital Signs BP (!) 114/56 (BP Location: Right Arm)   Pulse 83   Temp 98.3 F (36.8 C) (Oral)   Resp 18   Ht 5\' 6"  (1.676 m)   Wt 67.1 kg (148 lb)   SpO2 96%   BMI 23.89 kg/m   Physical Exam  Constitutional: She is oriented to person, place, and time. She appears well-developed and well-nourished. No distress.  HENT:  Head: Normocephalic and atraumatic.  Right Ear: Hearing normal.  Left Ear: Hearing normal.  Nose: Nose normal.  Mouth/Throat: Oropharynx is clear and moist and mucous membranes are normal.  Eyes: Conjunctivae and EOM are normal. Pupils are equal, round, and reactive to light.  Neck: Normal range of motion. Neck supple.  Cardiovascular: Regular rhythm, S1 normal and S2 normal.  Exam reveals no gallop and no friction rub.   No murmur heard. Pulmonary/Chest: Effort normal and breath sounds normal. No respiratory distress. She exhibits no tenderness.  Abdominal: Soft. Normal appearance and bowel sounds are normal. There is no  hepatosplenomegaly. There is tenderness. There is no rebound, no guarding, no tenderness at McBurney's point and negative Murphy's sign. No hernia.  Diffuse tenderness  of abdomen  Musculoskeletal: Normal range of motion.  Neurological: She is alert and oriented to person, place, and time. She has normal strength. No cranial nerve deficit or sensory deficit. Coordination normal. GCS eye subscore is 4. GCS verbal subscore is 5. GCS motor subscore is 6.  Skin: Skin is warm, dry and intact. No rash noted. No cyanosis.  Psychiatric: She has a normal mood and affect. Her speech is normal and behavior is normal. Thought content normal.  Nursing note and vitals reviewed.    ED Treatments / Results  DIAGNOSTIC STUDIES: Oxygen Saturation is 96% on RA, normal by my interpretation.    COORDINATION OF CARE: 11:40 PM Discussed treatment plan with pt at bedside and pt agreed to plan.  Labs (all labs ordered are listed, but only abnormal results are displayed) Labs Reviewed  COMPREHENSIVE METABOLIC PANEL - Abnormal; Notable for the following:       Result Value   Sodium 133 (*)    Potassium 3.4 (*)    Chloride 97 (*)    Glucose, Bld 257 (*)    BUN 22 (*)    Creatinine, Ser 1.28 (*)    Calcium 8.2 (*)    Albumin 3.0 (*)    GFR calc non Af Amer 47 (*)    GFR calc Af Amer 54 (*)    All other components within normal limits  CBC - Abnormal; Notable for the following:    RBC 3.55 (*)    Hemoglobin 9.4 (*)    HCT 28.8 (*)    RDW 17.0 (*)    Platelets 68 (*)    All other components within normal limits  AMMONIA - Abnormal; Notable for the following:    Ammonia 78 (*)    All other components within normal limits  URINALYSIS, ROUTINE W REFLEX MICROSCOPIC - Abnormal; Notable for the following:    APPearance HAZY (*)    All other components within normal limits  LIPASE, BLOOD    EKG  EKG Interpretation None       Radiology Ct Abdomen Pelvis Wo Contrast  Result Date:  07/02/2016 CLINICAL DATA:  History of hepatic cirrhosis and ascites now complains of constant gradually worsening abdominal pain for 2 days. Previously admitted for this problem 1 week ago without improvement. Weekly paracentesis but none since April 17. Allergic to IV contrast material. EXAM: CT ABDOMEN AND PELVIS WITHOUT CONTRAST TECHNIQUE: Multidetector CT imaging of the abdomen and pelvis was performed following the standard protocol without IV contrast. COMPARISON:  08/19/2015 FINDINGS: Lower chest: Moderate size right pleural effusion with consolidation or atelectasis in the right lung base. There is mild progression since previous study. Hepatobiliary: Changes of hepatic cirrhosis with nodular liver contour and prominence of the lateral segment left lobe and caudate lobes. No focal liver lesions are identified on noncontrast imaging. Surgical absence of the gallbladder. No bile duct dilatation. Pancreas: Unremarkable. No pancreatic ductal dilatation or surrounding inflammatory changes. Spleen: Marked splenic enlargement. Adrenals/Urinary Tract: No adrenal gland nodules. Multiple intrarenal stones in the right kidney, largest measuring 7 mm. No ureteral stones identified. No hydronephrosis or hydroureter. Bladder wall is not thickened. 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 Moderate fluid in the excluded stomach may be due to retrograde flow. No contrast material is demonstrated. Stomach, small bowel, and colon are not abnormally distended. Contrast material flows to the cecum without evidence of obstruction. Stool throughout the colon. There is diffuse small bowel wall thickening and right colonic wall thickening  which probably relates to ascites. Enteritis not excluded. Vascular/Lymphatic: Aortic atherosclerosis. No enlarged abdominal or pelvic lymph nodes. Reproductive: Status post hysterectomy. No adnexal masses. Other: Diffuse moderate abdominal ascites,  similar to prior study. Prominent abdominal and esophageal varices. No free air in the abdomen. Musculoskeletal: Degenerative changes in the spine. No destructive bone lesions. IMPRESSION: Hepatic cirrhosis with prominent portal venous hypertension including prominent upper abdominal and esophageal varices and diffuse splenic enlargement. Moderate abdominal and pelvic ascites similar to previous study. Diffuse small bowel wall thickening and right colonic wall thickening is probably related to ascites. No evidence of bowel obstruction. Moderate right pleural effusion with consolidation in the right lung base, increasing since prior study. Electronically Signed   By: Lucienne Capers M.D.   On: 07/02/2016 01:52    Procedures Procedures (including critical care time)  Medications Ordered in ED Medications  HYDROmorphone (DILAUDID) injection 1 mg (1 mg Intravenous Given 07/02/16 0416)  HYDROmorphone (DILAUDID) injection 1 mg (1 mg Intravenous Given 07/01/16 2347)  ondansetron (ZOFRAN) injection 4 mg (4 mg Intravenous Given 07/01/16 2344)     Initial Impression / Assessment and Plan / ED Course  I have reviewed the triage vital signs and the nursing notes.  Pertinent labs & imaging results that were available during my care of the patient were reviewed by me and considered in my medical decision making (see chart for details).     Patient presented to the ER for evaluation of abdominal pain. Patient has a history of liver cirrhosis. She reports that she has had recurrent ascites requiring paracentesis, has not had a paracentesis for one month. This is secondary to change in her diuretics causing her not taking the fluid. She has not had a fever. Patient was just hospitalized this past week for hepatic encephalopathy, reports since leaving the hospital she has had severe and progressively worsening abdominal pain.  She revealed diffuse tenderness but no clear peritonitis. She does not have an elevated  white blood cell count. Lab work is essentially unremarkable. Patient underwent CT abdomen and pelvis to further evaluate. No acute pathology is noted. She does have moderate ascites. I did examine her abdomen with an ultrasound, I do not see a window to perform diagnostic or therapeutic paracentesis. Intestine is lying directly up against the abdominal wall, there is no safe window for entry.  I discussed the possibility of empiric antibiotic therapy, analgesia and outpatient management with her gastroenterologist, Dr. Laural Golden. Patient and her husband were adamant that she could not go home in this condition. They felt that they were discharged "too early last time". Dr. Marin Comment, hospitalist service was therefore contacted.  Final Clinical Impressions(s) / ED Diagnoses   Final diagnoses:  Generalized abdominal pain    New Prescriptions New Prescriptions   No medications on file  I personally performed the services described in this documentation, which was scribed in my presence. The recorded information has been reviewed and is accurate.     Orpah Greek, MD 07/02/16 [phone]

## 2016-07-01 NOTE — ED Triage Notes (Signed)
On transplant list at [facility]- awaiting liver and pancreas- Dr Caprice Red is surgeon there  She has not spoken to the transplant coordinator re this visit  abd pain worse since [facility] Dr Laural Golden who suggested she come here

## 2016-07-01 NOTE — ED Notes (Signed)
Pt says just d/c from the hospital first of the week. Pt c/o increase abdominal pain & pain meds not helping. Was advised to MD to return to the ER. Pt denies any vomiting, has diarrhea due to medications. Denies any dark or bloody stools.

## 2016-07-02 DIAGNOSIS — Z90710 Acquired absence of both cervix and uterus: Secondary | ICD-10-CM | POA: Diagnosis not present

## 2016-07-02 DIAGNOSIS — K7290 Hepatic failure, unspecified without coma: Secondary | ICD-10-CM | POA: Diagnosis present

## 2016-07-02 DIAGNOSIS — Z9109 Other allergy status, other than to drugs and biological substances: Secondary | ICD-10-CM | POA: Diagnosis not present

## 2016-07-02 DIAGNOSIS — Z8619 Personal history of other infectious and parasitic diseases: Secondary | ICD-10-CM | POA: Diagnosis not present

## 2016-07-02 DIAGNOSIS — N200 Calculus of kidney: Secondary | ICD-10-CM | POA: Diagnosis present

## 2016-07-02 DIAGNOSIS — Z794 Long term (current) use of insulin: Secondary | ICD-10-CM | POA: Diagnosis not present

## 2016-07-02 DIAGNOSIS — E119 Type 2 diabetes mellitus without complications: Secondary | ICD-10-CM

## 2016-07-02 DIAGNOSIS — Z87442 Personal history of urinary calculi: Secondary | ICD-10-CM | POA: Diagnosis not present

## 2016-07-02 DIAGNOSIS — Z79891 Long term (current) use of opiate analgesic: Secondary | ICD-10-CM | POA: Diagnosis not present

## 2016-07-02 DIAGNOSIS — Z833 Family history of diabetes mellitus: Secondary | ICD-10-CM | POA: Diagnosis not present

## 2016-07-02 DIAGNOSIS — F329 Major depressive disorder, single episode, unspecified: Secondary | ICD-10-CM | POA: Diagnosis present

## 2016-07-02 DIAGNOSIS — R1084 Generalized abdominal pain: Secondary | ICD-10-CM

## 2016-07-02 DIAGNOSIS — F419 Anxiety disorder, unspecified: Secondary | ICD-10-CM | POA: Diagnosis present

## 2016-07-02 DIAGNOSIS — K5900 Constipation, unspecified: Secondary | ICD-10-CM | POA: Diagnosis present

## 2016-07-02 DIAGNOSIS — Z7682 Awaiting organ transplant status: Secondary | ICD-10-CM | POA: Diagnosis not present

## 2016-07-02 DIAGNOSIS — K7460 Unspecified cirrhosis of liver: Secondary | ICD-10-CM | POA: Diagnosis not present

## 2016-07-02 DIAGNOSIS — Z91041 Radiographic dye allergy status: Secondary | ICD-10-CM | POA: Diagnosis not present

## 2016-07-02 DIAGNOSIS — Z8711 Personal history of peptic ulcer disease: Secondary | ICD-10-CM | POA: Diagnosis not present

## 2016-07-02 DIAGNOSIS — B182 Chronic viral hepatitis C: Secondary | ICD-10-CM | POA: Diagnosis present

## 2016-07-02 DIAGNOSIS — K766 Portal hypertension: Secondary | ICD-10-CM | POA: Diagnosis present

## 2016-07-02 DIAGNOSIS — K652 Spontaneous bacterial peritonitis: Secondary | ICD-10-CM

## 2016-07-02 DIAGNOSIS — Z8249 Family history of ischemic heart disease and other diseases of the circulatory system: Secondary | ICD-10-CM | POA: Diagnosis not present

## 2016-07-02 DIAGNOSIS — Z9049 Acquired absence of other specified parts of digestive tract: Secondary | ICD-10-CM | POA: Diagnosis not present

## 2016-07-02 DIAGNOSIS — Z9114 Patient's other noncompliance with medication regimen: Secondary | ICD-10-CM | POA: Diagnosis not present

## 2016-07-02 DIAGNOSIS — Z881 Allergy status to other antibiotic agents status: Secondary | ICD-10-CM | POA: Diagnosis not present

## 2016-07-02 DIAGNOSIS — Z9884 Bariatric surgery status: Secondary | ICD-10-CM | POA: Diagnosis not present

## 2016-07-02 LAB — COMPREHENSIVE METABOLIC PANEL
ALBUMIN: 3 g/dL — AB (ref 3.5–5.0)
ALK PHOS: 98 U/L (ref 38–126)
ALT: 34 U/L (ref 14–54)
AST: 32 U/L (ref 15–41)
Anion gap: 9 (ref 5–15)
BUN: 19 mg/dL (ref 6–20)
CALCIUM: 8 mg/dL — AB (ref 8.9–10.3)
CO2: 29 mmol/L (ref 22–32)
Chloride: 92 mmol/L — ABNORMAL LOW (ref 101–111)
Creatinine, Ser: 1.04 mg/dL — ABNORMAL HIGH (ref 0.44–1.00)
GFR calc Af Amer: >60 mL/min (ref >60)
GFR calc non Af Amer: 60 mL/min — ABNORMAL LOW (ref >60)
GLUCOSE: 184 mg/dL — AB (ref 65–99)
POTASSIUM: 3.4 mmol/L — AB (ref 3.5–5.1)
Sodium: 130 mmol/L — ABNORMAL LOW (ref 135–145)
Total Bilirubin: 1.3 mg/dL — ABNORMAL HIGH (ref 0.3–1.2)
Total Protein: 6.6 g/dL (ref 6.5–8.1)

## 2016-07-02 LAB — CBC
HEMATOCRIT: 29.6 % — AB (ref 36.0–46.0)
Hemoglobin: 9.7 g/dL — ABNORMAL LOW (ref 12.0–15.0)
MCH: 26.7 pg (ref 26.0–34.0)
MCHC: 32.8 g/dL (ref 30.0–36.0)
MCV: 81.5 fL (ref 78.0–100.0)
Platelets: 71 10*3/uL — ABNORMAL LOW (ref 150–400)
RBC: 3.63 MIL/uL — ABNORMAL LOW (ref 3.87–5.11)
RDW: 17.1 % — AB (ref 11.5–15.5)
WBC: 5 10*3/uL (ref 4.0–10.5)

## 2016-07-02 LAB — URINALYSIS, ROUTINE W REFLEX MICROSCOPIC
Bilirubin Urine: NEGATIVE
GLUCOSE, UA: NEGATIVE mg/dL
Hgb urine dipstick: NEGATIVE
KETONES UR: NEGATIVE mg/dL
LEUKOCYTES UA: NEGATIVE
Nitrite: NEGATIVE
PROTEIN: NEGATIVE mg/dL
Specific Gravity, Urine: 1.011 (ref 1.005–1.030)
pH: 5 (ref 5.0–8.0)

## 2016-07-02 LAB — GLUCOSE, CAPILLARY
GLUCOSE-CAPILLARY: 162 mg/dL — AB (ref 65–99)
GLUCOSE-CAPILLARY: 147 mg/dL — AB (ref 65–99)
Glucose-Capillary: 188 mg/dL — ABNORMAL HIGH (ref 65–99)
Glucose-Capillary: 238 mg/dL — ABNORMAL HIGH (ref 65–99)
Glucose-Capillary: 333 mg/dL — ABNORMAL HIGH (ref 65–99)

## 2016-07-02 MED ORDER — GABAPENTIN 100 MG PO CAPS
100.0000 mg | ORAL_CAPSULE | Freq: Every day | ORAL | Status: DC
  Administered 2016-07-02: 100 mg via ORAL
  Administered 2016-07-03: 200 mg via ORAL
  Filled 2016-07-02: qty 2
  Filled 2016-07-02: qty 1

## 2016-07-02 MED ORDER — LACTULOSE 10 GM/15ML PO SOLN
30.0000 g | Freq: Three times a day (TID) | ORAL | Status: DC
  Administered 2016-07-02 – 2016-07-04 (×7): 30 g via ORAL
  Filled 2016-07-02 (×7): qty 60

## 2016-07-02 MED ORDER — LACTULOSE 10 GM/15ML PO SOLN
60.0000 g | Freq: Once | ORAL | Status: AC
  Administered 2016-07-02: 60 g via ORAL
  Filled 2016-07-02: qty 90

## 2016-07-02 MED ORDER — ONDANSETRON HCL 4 MG/2ML IJ SOLN
4.0000 mg | INTRAMUSCULAR | Status: DC | PRN
  Administered 2016-07-02 – 2016-07-04 (×3): 4 mg via INTRAVENOUS
  Filled 2016-07-02 (×3): qty 2

## 2016-07-02 MED ORDER — POTASSIUM CHLORIDE CRYS ER 20 MEQ PO TBCR
20.0000 meq | EXTENDED_RELEASE_TABLET | Freq: Every day | ORAL | Status: DC
  Administered 2016-07-02 – 2016-07-04 (×3): 20 meq via ORAL
  Filled 2016-07-02 (×3): qty 1

## 2016-07-02 MED ORDER — ESCITALOPRAM OXALATE 10 MG PO TABS
20.0000 mg | ORAL_TABLET | Freq: Every day | ORAL | Status: DC
  Administered 2016-07-02 – 2016-07-04 (×3): 20 mg via ORAL
  Filled 2016-07-02 (×3): qty 2

## 2016-07-02 MED ORDER — BISACODYL 5 MG PO TBEC
5.0000 mg | DELAYED_RELEASE_TABLET | Freq: Every day | ORAL | Status: DC | PRN
  Administered 2016-07-04: 5 mg via ORAL
  Filled 2016-07-02: qty 1

## 2016-07-02 MED ORDER — PANTOPRAZOLE SODIUM 40 MG PO TBEC
40.0000 mg | DELAYED_RELEASE_TABLET | Freq: Every day | ORAL | Status: DC
  Administered 2016-07-02 – 2016-07-04 (×3): 40 mg via ORAL
  Filled 2016-07-02 (×3): qty 1

## 2016-07-02 MED ORDER — DIPHENHYDRAMINE HCL 25 MG PO CAPS
25.0000 mg | ORAL_CAPSULE | Freq: Four times a day (QID) | ORAL | Status: DC | PRN
  Administered 2016-07-02: 25 mg via ORAL
  Filled 2016-07-02: qty 2

## 2016-07-02 MED ORDER — INSULIN ASPART 100 UNIT/ML ~~LOC~~ SOLN
0.0000 [IU] | SUBCUTANEOUS | Status: DC
  Administered 2016-07-02: 3 [IU] via SUBCUTANEOUS
  Administered 2016-07-02: 7 [IU] via SUBCUTANEOUS
  Administered 2016-07-02: 1 [IU] via SUBCUTANEOUS
  Administered 2016-07-02: 2 [IU] via SUBCUTANEOUS
  Administered 2016-07-03: 7 [IU] via SUBCUTANEOUS
  Administered 2016-07-03: 2 [IU] via SUBCUTANEOUS
  Administered 2016-07-03: 3 [IU] via SUBCUTANEOUS
  Administered 2016-07-03: 2 [IU] via SUBCUTANEOUS
  Administered 2016-07-03: 9 [IU] via SUBCUTANEOUS
  Administered 2016-07-04: 3 [IU] via SUBCUTANEOUS
  Administered 2016-07-04: 2 [IU] via SUBCUTANEOUS
  Administered 2016-07-04: 5 [IU] via SUBCUTANEOUS
  Administered 2016-07-04: 3 [IU] via SUBCUTANEOUS

## 2016-07-02 MED ORDER — CIPROFLOXACIN IN D5W 400 MG/200ML IV SOLN
400.0000 mg | Freq: Two times a day (BID) | INTRAVENOUS | Status: DC
  Administered 2016-07-02 – 2016-07-04 (×5): 400 mg via INTRAVENOUS
  Filled 2016-07-02 (×5): qty 200

## 2016-07-02 MED ORDER — HYDROMORPHONE HCL 1 MG/ML IJ SOLN
1.0000 mg | INTRAMUSCULAR | Status: DC | PRN
  Administered 2016-07-02 – 2016-07-04 (×6): 1 mg via INTRAVENOUS
  Filled 2016-07-02 (×7): qty 1

## 2016-07-02 MED ORDER — PRO-STAT SUGAR FREE PO LIQD
30.0000 mL | Freq: Two times a day (BID) | ORAL | Status: DC
  Administered 2016-07-02 – 2016-07-04 (×5): 30 mL via ORAL
  Filled 2016-07-02 (×5): qty 30

## 2016-07-02 MED ORDER — RIFAXIMIN 550 MG PO TABS
550.0000 mg | ORAL_TABLET | Freq: Two times a day (BID) | ORAL | Status: DC
  Administered 2016-07-02 – 2016-07-04 (×5): 550 mg via ORAL
  Filled 2016-07-02 (×5): qty 1

## 2016-07-02 MED ORDER — B COMPLEX-C PO TABS
1.0000 | ORAL_TABLET | Freq: Every day | ORAL | Status: DC
  Administered 2016-07-02 – 2016-07-04 (×3): 1 via ORAL
  Filled 2016-07-02 (×3): qty 1

## 2016-07-02 MED ORDER — FUROSEMIDE 80 MG PO TABS
100.0000 mg | ORAL_TABLET | Freq: Every day | ORAL | Status: DC
  Administered 2016-07-02 – 2016-07-04 (×3): 100 mg via ORAL
  Filled 2016-07-02 (×3): qty 1

## 2016-07-02 MED ORDER — TRAZODONE HCL 50 MG PO TABS
50.0000 mg | ORAL_TABLET | Freq: Every day | ORAL | Status: DC
  Administered 2016-07-02 – 2016-07-03 (×2): 50 mg via ORAL
  Filled 2016-07-02 (×2): qty 1

## 2016-07-02 MED ORDER — ENSURE ENLIVE PO LIQD
237.0000 mL | Freq: Two times a day (BID) | ORAL | Status: DC
  Administered 2016-07-02 – 2016-07-04 (×3): 237 mL via ORAL

## 2016-07-02 MED ORDER — ADULT MULTIVITAMIN W/MINERALS CH
1.0000 | ORAL_TABLET | Freq: Every day | ORAL | Status: DC
  Administered 2016-07-02 – 2016-07-04 (×3): 1 via ORAL
  Filled 2016-07-02 (×3): qty 1

## 2016-07-02 MED ORDER — SPIRONOLACTONE 100 MG PO TABS
300.0000 mg | ORAL_TABLET | Freq: Every day | ORAL | Status: DC
  Administered 2016-07-02 – 2016-07-04 (×3): 300 mg via ORAL
  Filled 2016-07-02 (×3): qty 3

## 2016-07-02 MED ORDER — METOLAZONE 5 MG PO TABS
2.5000 mg | ORAL_TABLET | Freq: Every day | ORAL | Status: DC
  Administered 2016-07-02 – 2016-07-04 (×2): 2.5 mg via ORAL
  Filled 2016-07-02 (×3): qty 1

## 2016-07-02 MED ORDER — BISACODYL 10 MG RE SUPP
10.0000 mg | Freq: Once | RECTAL | Status: AC
  Administered 2016-07-02: 10 mg via RECTAL
  Filled 2016-07-02: qty 1

## 2016-07-02 NOTE — ED Notes (Signed)
Pt carried to restroom via wheelchair & returned to the room.

## 2016-07-02 NOTE — Progress Notes (Signed)
Patient seen and evaluated, chart reviewed, please see EMR for updated orders. Please see full H&P dictated by admitting physician for same date of service.   Patient admitted with abdominal pain and concerns about possible spontaneous bacterial peritonitis, started on Cipro. CT abdomen suggest some degree of constipation lactulose advised  Patient seen and evaluated, chart reviewed, please see EMR for updated orders. Please see full H&P dictated by admitting physician for same date of service.

## 2016-07-02 NOTE — Progress Notes (Signed)
Initial Nutrition Assessment  DOCUMENTATION CODES:  Not applicable  INTERVENTION:  Ensure Enlive po BID, each supplement provides 350 kcal and 20 grams of protein  Will order 30 mL Prostat BID, each supplement provides 100 kcal and 15 grams of protein.  NUTRITION DIAGNOSIS:  Increased nutrient needs related to chronic illness (Cirrhosis) as evidenced by estimated nutritional requirements for this condition  GOAL:  Patient will meet greater than or equal to 90% of their needs  MONITOR:  PO intake, Supplement acceptance, Labs, I & O's  REASON FOR ASSESSMENT:  Malnutrition Screening Tool    ASSESSMENT:  56 y/o female PMHx Hep C w/ hepatic cirrhosis requiring periodic paracenteses on transplant list, Anxiety, Depression, Gastric Bypass. Presents with severe abdominal pain for several days. Suspected SBP.   RD operating remotely. Pt had screen on [facility] report that she had a poor appetite w/ poor intake and had unintentionally lost weight. No direct mention of poor po intake in chart, but likely her abdominal pain had decreased her intake.   Her weight history is fairly stable. 145-155 for the last six months. She has lost 10 lbs in the last year. Last paracentesis appears to have been 4/12 w/ removal of 6 liters. She is on a large number of diuretics.   Per Care Everywhere, was seen by RD 3 months ago as part of transplant evaluation and was counseled on appropriate diet  No intake at this time. Will add supplements in between meals to reflect cirrhotic diet guidelines. Follow up as appropriate.   Labs: Bg 160-250, Albumin:3.0, NA: 130,  Medications: Ensure, Lasix, B complex, MVI, Metolazone, lactulose, Spironolactone, rifaximin, KCL, PPI,    Recent Labs Lab 06/26/16 0539 07/01/16 2252 07/02/16 0651  NA 130* 133* 130*  K 3.6 3.4* 3.4*  CL 96* 97* 92*  CO2 27 27 29   BUN 18 22* 19  CREATININE 1.02* 1.28* 1.04*  CALCIUM 8.3* 8.2* 8.0*  GLUCOSE 215* 257* 184*   Diet Order:   Diet Carb Modified Fluid consistency: Thin; Room service appropriate? Yes  Skin:  Reviewed, no issues  Last BM:  Unknown  Height:  Ht Readings from Last 1 Encounters:  07/01/16 5\' 6"  (1.676 m)   Weight:  Wt Readings from Last 1 Encounters:  07/01/16 148 lb (67.1 kg)   Wt Readings from Last 10 Encounters:  07/01/16 148 lb (67.1 kg)  06/26/16 148 lb 1.6 oz (67.2 kg)  05/24/16 165 lb 3 oz (74.9 kg)  05/22/16 157 lb (71.2 kg)  05/07/16 145 lb (65.8 kg)  03/22/16 149 lb (67.6 kg)  02/06/16 150 lb (68 kg)  01/14/16 151 lb 12.8 oz (68.9 kg)  01/03/16 145 lb (65.8 kg)  11/26/15 151 lb (68.5 kg)   Ideal Body Weight:  59.1 kg  BMI:  Body mass index is 23.89 kg/m.  Estimated Nutritional Needs:  Kcal:  1800-2000 kcals (27-30 kcal/kg bw) Protein:  81-100g Pro (1.2-1.5 g/kg bw) Fluid:  Per MD  EDUCATION NEEDS:  Education needs no appropriate at this time  Burtis Junes RD, LDN, [place] Clinical Nutrition Pager: [phone] 07/02/2016 8:32 AM

## 2016-07-02 NOTE — H&P (Signed)
History and Physical    [name] QMG:[id number] DOB: Jul 22, 1960 DOA: 07/01/2016  PCP: Redmond School, MD  Patient coming from: Home.    Chief Complaint:  Abdominal pain, more severe than usual.   HPI:  56 year old female with a history of chronic hepatitis C with hepatic cirrhosis, portal vein thrombosis, esophageal varices presents with several days history of excruciating abdominal pain. She had an episode of spontaneous bacterial peritonitis (SBP) and had several admissions for same, but her last admission was due to hepatic encephalopathy. She has been on Bactrim DS as prophylaxis.  Her gastroenterologist is Dr Dereck Leep and she has been on the liver transplant list. She endorses compliance with all her medications. She denies any chest pain, shortness breath, vomiting, diarrhea, hematochezia, melena, hematemesis, headache, dizziness. There is no dysuria or hematuria. Work up in the ER included no fever, no leukocytosis, and abdominal pelvic CT showed moderate amount of ascites, but US guided paracentesis did not show enough fluid to perform bedside paracentesis.  She also has no tense abdomen as she had in the past, when she has her weekly paracentesis.  Since her diuretics were adjsuted, she did not require as frequent paracentesis.   Hospitalist was asked to admit her for abdominal pain.  ED Course:  See above.  Rewiew of Systems:  Constitutional: Negative for malaise, fever and chills. No significant weight loss or weight gain Eyes: Negative for eye pain, redness and discharge, diplopia, visual changes, or flashes of light. ENMT: Negative for ear pain, hoarseness, nasal congestion, sinus pressure and sore throat. No headaches; tinnitus, drooling, or problem swallowing. Cardiovascular: Negative for chest pain, palpitations, diaphoresis, dyspnea and peripheral edema. ; No orthopnea, PND Respiratory: Negative for cough, hemoptysis, wheezing and stridor. No pleuritic  chestpain. Gastrointestinal: Negative for diarrhea, constipation,  melena, blood in stool, hematemesis, jaundice and rectal bleeding.    Genitourinary: Negative for frequency, dysuria, incontinence,flank pain and hematuria; Musculoskeletal: Negative for back pain and neck pain. Negative for swelling and trauma.;  Skin: . Negative for pruritus, rash, abrasions, bruising and skin lesion.; ulcerations Neuro: Negative for headache, lightheadedness and neck stiffness. Negative for weakness, altered level of consciousness , altered mental status, extremity weakness, burning feet, involuntary movement, seizure and syncope.  Psych: negative for anxiety, depression, insomnia, tearfulness, panic attacks, hallucinations, paranoia, suicidal or homicidal ideation .  Past Medical History:  Diagnosis Date  . Anemia   . Anxiety   . Ascites   . 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 Complication of anesthesia   . Depression   . Diabetes mellitus type 2, controlled, without complications ([place])   . Enteropathy 07/31/2015   Portal hypertensive enteropathy per capsule study, with stigmata of bleeding.  . Esophageal varices ([place])   . Gastric ulcer 07/15/2015   per EGD; no stigmata of bleeding  . GI bleed 07/17/2015  . Gram-negative bacteremia 12/08/2011  . QBVQXIHW(388.8)    "monthly" (11/07/2012)  . Heart murmur   . Hepatitis C    Hx: of Hep "C" it was eradicated  . History of hepatitis C - successfuly treated medically 07/14/2015  . Kidney stones   . Liver failure ([place])    "I'm on liver transplant list @ [facility]" (11/07/2012)  . PONV (postoperative nausea and vomiting)   . Portal vein thrombosis   . Renal insufficiency   . Right ureteral stone 12/07/2011  . 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 Type II diabetes mellitus ([place])    "not since gastric bypass" (11/07/2012)  Past Surgical History:  Procedure Laterality Date  . ABDOMINAL HYSTERECTOMY  2003  . CESAREAN SECTION  1995  .  CHOLECYSTECTOMY  1987  . COLONOSCOPY    . COLONOSCOPY  10/21/2011   Procedure: COLONOSCOPY;  Surgeon: Rogene Houston, MD;  [facility];  Service: Endoscopy;  Laterality: N/A;  245   . CYSTOSCOPY W/ RETROGRADES  12/08/2011   Procedure: CYSTOSCOPY WITH RETROGRADE PYELOGRAM;  Surgeon: Marissa Nestle, MD;  Location: [facility];  Service: Urology;  Laterality: Right;  . CYSTOSCOPY WITH STENT PLACEMENT  12/08/2011   Procedure: CYSTOSCOPY WITH STENT PLACEMENT;  Surgeon: Marissa Nestle, MD;  Location: [facility];  Service: Urology;  Laterality: Right;  . DILATION AND CURETTAGE OF UTERUS    . ESOPHAGOGASTRODUODENOSCOPY N/A 02/19/2014   Procedure: ESOPHAGOGASTRODUODENOSCOPY (EGD);  Surgeon: Rogene Houston, MD;  [facility];  Service: Endoscopy;  Laterality: N/A;  100  . ESOPHAGOGASTRODUODENOSCOPY N/A 07/15/2015   Procedure: ESOPHAGOGASTRODUODENOSCOPY (EGD);  Surgeon: Rogene Houston, MD;  [facility];  Service: Endoscopy;  Laterality: N/A;  . ESOPHAGOGASTRODUODENOSCOPY N/A 07/29/2015   Procedure: ESOPHAGOGASTRODUODENOSCOPY (EGD);  Surgeon: Rogene Houston, MD;  [facility];  Service: Endoscopy;  Laterality: N/A;  . GASTRIC BYPASS  ~ 1995  . HERNIA REPAIR  04/14/11   "one in my bellybutton" (11/07/2012)  . INGUINAL HERNIA REPAIR Right    "maybe 2" (11/07/2012)  . KNEE ARTHROSCOPY Right   . LITHOTRIPSY     "several times" (11/07/2012)  . OPEN REDUCTION INTERNAL FIXATION (ORIF) DISTAL RADIAL FRACTURE Right 11/07/2012   Procedure: OPEN REDUCTION INTERNAL FIXATION (ORIF) RIGHT DISTAL RADIAL FRACTURE;  Surgeon: [name], MD;  Location: [facility];  Service: Orthopedics;  Laterality: Right;  . ORIF DISTAL RADIUS FRACTURE Right 11/07/2012  . UPPER GASTROINTESTINAL ENDOSCOPY       reports that she quit smoking about 25 years ago. Her smoking use included Cigarettes. She has a 2.50 pack-year smoking history. She has never used smokeless tobacco. She reports that she  does not drink alcohol or use drugs.  Allergies  Allergen Reactions  . Ancef [Cefazolin Sodium] Other (See Comments)    Reaction:  Vaginal and mouth blisters  . Gadobenate Itching, Nausea And Vomiting and Rash  . Iodinated Diagnostic Agents Itching, Nausea And Vomiting and Rash  . Tape Rash    Family History  Problem Relation Age of Onset  . Dementia Mother   . Heart disease Father   . Liver disease Father   . Hypertension Father   . Diabetes Father   . Dementia Father   . Hypertension Brother   . Other Son        Cervical dystonia     Prior to Admission medications   Medication Sig Start Date End Date Taking? Authorizing Provider  b complex vitamins tablet Take 1 tablet by mouth daily.    Yes [provider]  escitalopram (LEXAPRO) 20 MG tablet Take 20 mg by mouth daily.    Yes Rehman, Mechele Dawley, MD  ferrous sulfate 325 (65 FE) MG tablet Take 325 mg by mouth daily with breakfast.    Yes Rehman, Najeeb U, MD  furosemide (LASIX) 40 MG tablet Take 2.5 tablets (100 mg total) by mouth daily. 11/14/15  Yes Kathie Dike, MD  gabapentin (NEURONTIN) 100 MG capsule Take 100-200 mg by mouth at bedtime.    Yes [provider]  insulin glargine (LANTUS) 100 unit/mL SOPN Inject 20 Units into the skin  at bedtime.   Yes [provider]  lactulose (CHRONULAC) 10 GM/15ML solution Take 45 mLs (30 g total) by mouth 3 (three) times daily. To achieve 3 bowel movements a day 06/26/16  Yes Eber Jones, MD  levofloxacin (LEVAQUIN) 250 MG tablet Take 1 tablet (250 mg total) by mouth every other day. 03/22/16  Yes Rehman, Mechele Dawley, MD  metolazone (ZAROXOLYN) 2.5 MG tablet TAKE 1 TABLET EVERY 4TH DAY. 06/29/16  Yes Rehman, Mechele Dawley, MD  Multiple Vitamin (MULTIVITAMIN WITH MINERALS) TABS tablet Take 1 tablet by mouth daily.    Yes [provider]  Oxycodone HCl 10 MG TABS Take 10 mg by mouth every 6 (six) hours as needed (for pain).    Yes [provider]   pantoprazole (PROTONIX) 40 MG tablet Take 1 tablet (40 mg total) by mouth daily before breakfast. 03/22/16  Yes Rehman, Mechele Dawley, MD  potassium chloride SA (K-DUR,KLOR-CON) 20 MEQ tablet Take 20 mEq by mouth daily.   Yes [provider]  rifaximin (XIFAXAN) 550 MG TABS tablet Take 1 tablet (550 mg total) by mouth 2 (two) times daily. 09/24/15  Yes Rehman, Mechele Dawley, MD  spironolactone (ALDACTONE) 100 MG tablet Take 3 tablets (300 mg total) by mouth daily. 11/16/15  Yes Setzer, Rona Ravens, NP  traZODone (DESYREL) 50 MG tablet Take 1 tablet (50 mg total) by mouth at bedtime. 11/11/15  Yes Thurnell Lose, MD    Physical Exam: Vitals:   07/01/16 2211 07/01/16 2212  BP: (!) 114/56   Pulse: 83   Resp: 18   Temp: 98.3 F (36.8 C)   TempSrc: Oral   SpO2: 96%   Weight:  67.1 kg (148 lb)  Height:  5\' 6"  (1.676 m)      Constitutional: NAD, calm, comfortable Vitals:   07/01/16 2211 07/01/16 2212  BP: (!) 114/56   Pulse: 83   Resp: 18   Temp: 98.3 F (36.8 C)   TempSrc: Oral   SpO2: 96%   Weight:  67.1 kg (148 lb)  Height:  5\' 6"  (1.676 m)   Eyes: PERRL, lids and conjunctivae normal ENMT: Mucous membranes are moist. Posterior pharynx clear of any exudate or lesions.Normal dentition.  Neck: normal, supple, no masses, no thyromegaly Respiratory: clear to auscultation bilaterally, no wheezing, no crackles. Normal respiratory effort. No accessory muscle use.  Cardiovascular: Regular rate and rhythm, no murmurs / rubs / gallops. No extremity edema. 2+ pedal pulses. No carotid bruits.  Abdomen: tenderness over the epigastric area, no rebound, no tense abdomen. no masses palpated. No hepatosplenomegaly. Bowel sounds positive.  Musculoskeletal: no clubbing / cyanosis. No joint deformity upper and lower extremities. Good ROM, no contractures. Normal muscle tone.  Skin: no rashes, lesions, ulcers. No induration Neurologic: CN 2-12 grossly intact. Sensation intact, DTR normal. Strength 5/5  in all 4.  Psychiatric: Normal judgment and insight. Alert and oriented x 3. Normal mood.    Labs on Admission: I have personally reviewed following labs and imaging studies CBC:  Recent Labs Lab 06/26/16 0539 07/01/16 2252  WBC 4.0 4.8  NEUTROABS 3.0  --   HGB 8.6* 9.4*  HCT 26.1* 28.8*  MCV 81.1 81.1  PLT 58* 68*   Basic Metabolic Panel:  Recent Labs Lab 06/25/16 0553 06/26/16 0539 07/01/16 2252  NA 132* 130* 133*  K 4.3 3.6 3.4*  CL 97* 96* 97*  CO2 29 27 27   GLUCOSE 143* 215* 257*  BUN 19 18 22*  CREATININE 1.05* 1.02*  1.28*  CALCIUM 7.9* 8.3* 8.2*   GFR: Estimated Creatinine Clearance: 47 mL/min (A) (by C-G formula based on SCr of 1.28 mg/dL (H)). Liver Function Tests:  Recent Labs Lab 07/01/16 2252  AST 35  ALT 32  ALKPHOS 95  BILITOT 1.0  PROT 6.7  ALBUMIN 3.0*    Recent Labs Lab 07/01/16 2252  LIPASE 25    Recent Labs Lab 06/25/16 0553 06/26/16 0539 07/01/16 2252  AMMONIA 101* 61* 78*   CBG:  Recent Labs Lab 06/25/16 1217 06/25/16 1656 06/25/16 2200 06/26/16 0744 06/26/16 1118  GLUCAP 299* 214* 167* 187* 258*   Urine analysis:    Component Value Date/Time   COLORURINE YELLOW 07/02/2016 0059   APPEARANCEUR HAZY (A) 07/02/2016 0059   LABSPEC 1.011 07/02/2016 0059   PHURINE 5.0 07/02/2016 0059   GLUCOSEU NEGATIVE 07/02/2016 0059   HGBUR NEGATIVE 07/02/2016 0059   BILIRUBINUR NEGATIVE 07/02/2016 0059   KETONESUR NEGATIVE 07/02/2016 0059   PROTEINUR NEGATIVE 07/02/2016 0059   UROBILINOGEN 0.2 05/28/2013 0206   NITRITE NEGATIVE 07/02/2016 0059   LEUKOCYTESUR NEGATIVE 07/02/2016 0059    Recent Results (from the past 240 hour(s))  Urine culture     Status: None   Collection Time: 06/23/16  9:06 PM  Result Value Ref Range Status   Specimen Description URINE, RANDOM  Final   Special Requests NONE  Final   Culture   Final    NO GROWTH Performed at [facility], [place] [address], [place], [place] [address]    Report  Status 06/25/2016 FINAL  Final  Gram stain     Status: None   Collection Time: 06/23/16 10:43 PM  Result Value Ref Range Status   Specimen Description PERITONEAL  Final   Special Requests NONE  Final   Gram Stain   Final    ABUNDANT WBC PRESENT, PREDOMINANTLY MONONUCLEAR NO ORGANISMS SEEN    Report Status 06/24/2016 FINAL  Final  Culture, body fluid-bottle     Status: None   Collection Time: 06/23/16 10:43 PM  Result Value Ref Range Status   Specimen Description PERITONEAL ABDOMEN  Final   Special Requests   Final    BOTTLES DRAWN AEROBIC ONLY Blood Culture adequate volume   Culture NO GROWTH 5 DAYS  Final   Report Status 06/28/2016 FINAL  Final     Radiological Exams on Admission: Ct Abdomen Pelvis Wo Contrast  Result Date: 07/02/2016 CLINICAL DATA:  History of hepatic cirrhosis and ascites now complains of constant gradually worsening abdominal pain for 2 days. Previously admitted for this problem 1 week ago without improvement. Weekly paracentesis but none since April 17. Allergic to IV contrast material. EXAM: CT ABDOMEN AND PELVIS WITHOUT CONTRAST TECHNIQUE: Multidetector CT imaging of the abdomen and pelvis was performed following the standard protocol without IV contrast. COMPARISON:  08/19/2015 FINDINGS: Lower chest: Moderate size right pleural effusion with consolidation or atelectasis in the right lung base. There is mild progression since previous study. Hepatobiliary: Changes of hepatic cirrhosis with nodular liver contour and prominence of the lateral segment left lobe and caudate lobes. No focal liver lesions are identified on noncontrast imaging. Surgical absence of the gallbladder. No bile duct dilatation. Pancreas: Unremarkable. No pancreatic ductal dilatation or surrounding inflammatory changes. Spleen: Marked splenic enlargement. Adrenals/Urinary Tract: No adrenal gland nodules. Multiple intrarenal stones in the right kidney, largest measuring 7 mm. No ureteral stones  identified. No hydronephrosis or hydroureter. Bladder wall is not thickened. Stomach/Bowel: Postoperative changes in the stomach consistent with  gastric bypass. Moderate fluid in the excluded stomach may be due to retrograde flow. No contrast material is demonstrated. Stomach, small bowel, and colon are not abnormally distended. Contrast material flows to the cecum without evidence of obstruction. Stool throughout the colon. There is diffuse small bowel wall thickening and right colonic wall thickening which probably relates to ascites. Enteritis not excluded. Vascular/Lymphatic: Aortic atherosclerosis. No enlarged abdominal or pelvic lymph nodes. Reproductive: Status post hysterectomy. No adnexal masses. Other: Diffuse moderate abdominal ascites, similar to prior study. Prominent abdominal and esophageal varices. No free air in the abdomen. Musculoskeletal: Degenerative changes in the spine. No destructive bone lesions. IMPRESSION: Hepatic cirrhosis with prominent portal venous hypertension including prominent upper abdominal and esophageal varices and diffuse splenic enlargement. Moderate abdominal and pelvic ascites similar to previous study. Diffuse small bowel wall thickening and right colonic wall thickening is probably related to ascites. No evidence of bowel obstruction. Moderate right pleural effusion with consolidation in the right lung base, increasing since prior study. Electronically Signed   By: Lucienne Capers M.D.   On: 07/02/2016 01:52    EKG: Independently reviewed.   Assessment/Plan Principal Problem:   Abdominal pain Active Problems:   Hepatic encephalopathy ([facility])   SBP (spontaneous bacterial peritonitis) ([place])   History of hepatitis C - successfuly treated medically   Gastric ulcer   Diabetes mellitus type 2, controlled, without complications ([place])   Alcoholic cirrhosis of liver with ascites ([place])    PLAN:   Abdominal pain:  I suspect she has SBP.  The bedside US did not show  adequate fluid to tap, and she is allergic to PCN, so will give her IV Cipro.  CT of the abdomen also showed significant stool burden, so will give her one dose of dulcholax suppository, and PRN oral lactulose, as she is on chronic narcotics.  Can give PRN IV dilaudid for now.  I don't think she has an ulcer, but will continue with oral PPI as well.  Cirrhosis:  Continue with Xifaxan and lactulose.  NH3 level is 78, better than last admission, and she is certainly not encephalopathic.   DM:  Will give carb modified diet, and use SSI for coverages.  Hepatitis C:  S/p succesful Tx per record.      DVT prophylaxis: SCD.  Code Status: FULL CODE.  Family Communication: husband at bedside.  Disposition Plan: to home.  Consults called: None.  Admission status: OBS.    Destane Speas MD FACP. [facility]  If 7PM-7AM, please contact night-coverage [email or web address] Password [email or web address]  07/02/2016, 4:11 AM

## 2016-07-03 LAB — CBC
HEMATOCRIT: 27.5 % — AB (ref 36.0–46.0)
Hemoglobin: 9 g/dL — ABNORMAL LOW (ref 12.0–15.0)
MCH: 26.5 pg (ref 26.0–34.0)
MCHC: 32.7 g/dL (ref 30.0–36.0)
MCV: 81.1 fL (ref 78.0–100.0)
Platelets: 69 10*3/uL — ABNORMAL LOW (ref 150–400)
RBC: 3.39 MIL/uL — AB (ref 3.87–5.11)
RDW: 16.8 % — AB (ref 11.5–15.5)
WBC: 5.2 10*3/uL (ref 4.0–10.5)

## 2016-07-03 LAB — GLUCOSE, CAPILLARY
GLUCOSE-CAPILLARY: 175 mg/dL — AB (ref 65–99)
GLUCOSE-CAPILLARY: 380 mg/dL — AB (ref 65–99)
Glucose-Capillary: 196 mg/dL — ABNORMAL HIGH (ref 65–99)
Glucose-Capillary: 328 mg/dL — ABNORMAL HIGH (ref 65–99)
Glucose-Capillary: 225 mg/dL — ABNORMAL HIGH (ref 65–99)

## 2016-07-03 LAB — COMPREHENSIVE METABOLIC PANEL
ALT: 31 U/L (ref 14–54)
AST: 26 U/L (ref 15–41)
Albumin: 2.8 g/dL — ABNORMAL LOW (ref 3.5–5.0)
Alkaline Phosphatase: 86 U/L (ref 38–126)
Anion gap: 8 (ref 5–15)
BILIRUBIN TOTAL: 1 mg/dL (ref 0.3–1.2)
BUN: 25 mg/dL — AB (ref 6–20)
CHLORIDE: 93 mmol/L — AB (ref 101–111)
CO2: 27 mmol/L (ref 22–32)
CREATININE: 1.27 mg/dL — AB (ref 0.44–1.00)
Calcium: 8.6 mg/dL — ABNORMAL LOW (ref 8.9–10.3)
GFR calc Af Amer: 54 mL/min — ABNORMAL LOW (ref >60)
GFR, EST NON AFRICAN AMERICAN: 47 mL/min — AB (ref >60)
Glucose, Bld: 216 mg/dL — ABNORMAL HIGH (ref 65–99)
POTASSIUM: 3.7 mmol/L (ref 3.5–5.1)
Sodium: 128 mmol/L — ABNORMAL LOW (ref 135–145)
TOTAL PROTEIN: 6.2 g/dL — AB (ref 6.5–8.1)

## 2016-07-03 MED ORDER — INSULIN GLARGINE 100 UNIT/ML ~~LOC~~ SOLN
5.0000 [IU] | Freq: Every day | SUBCUTANEOUS | Status: DC
  Administered 2016-07-03: 5 [IU] via SUBCUTANEOUS
  Filled 2016-07-03 (×2): qty 0.05

## 2016-07-03 MED ORDER — LACTULOSE 10 GM/15ML PO SOLN
30.0000 g | Freq: Once | ORAL | Status: AC
  Administered 2016-07-03: 30 g via ORAL
  Filled 2016-07-03: qty 60

## 2016-07-03 NOTE — Progress Notes (Signed)
Patient Demographics:    [name], is a 56 y.o. female, DOB - [birth year], LAG:[id number]  Admit date - 07/01/2016   Admitting Physician Orvan Falconer, MD  Outpatient Primary MD for the patient is Redmond School, MD  LOS - 1   Chief Complaint  Patient presents with  . Abdominal Pain    pt is on liver transplant list        Subjective:    [name] today has no fevers, no emesis,  No chest pain,  abd pain is better,    Assessment  & Plan :    Principal Problem:   Abdominal pain Active Problems:   Hepatic encephalopathy (HCC)   SBP (spontaneous bacterial peritonitis) ([place])   History of hepatitis C - successfuly treated medically   Gastric ulcer   Diabetes mellitus type 2, controlled, without complications (HCC)   Alcoholic cirrhosis of liver with ascites (HCC)   1)Abdominal pain:  ??? Constipation, Patient admits to noncompliance with lactulose at home, give lactulose as prescribed, she is on a Chronically making the constipation worse.  CT of the abdomen also showed significant stool burden.    Cannot rule out some component of spontaneous bacterial peritonitis, however the bedside US did not show adequate fluid to tap, and she is allergic to PCN, continue IV Cipro.   2)Cirrhosis:  Continue with Xifaxan and lactulose, .  NH3 level is 78, better than last admission, despite noncompliance with lactulose at home and elevated ammonia levels ( below her recent baseline ) she is certainly not encephalopathic.   3)DM: Use Novolog/Humalog Sliding scale insulin with Accu-Cheks/Fingersticks as ordered   4)H/o Hepatitis C:  S/p succesful Tx per record.      DVT prophylaxis: SCD.  Code Status: FULL CODE.  Family Communication: husband at bedside.  Disposition Plan: to home.  Consults called: None.    Lab Results  Component Value Date   PLT 69 (L) 07/03/2016    Inpatient  Medications  Scheduled Meds: . B-complex with vitamin C  1 tablet Oral Daily  . escitalopram  20 mg Oral Daily  . feeding supplement (ENSURE ENLIVE)  237 mL Oral BID BM  . feeding supplement (PRO-STAT SUGAR FREE 64)  30 mL Oral BID  . furosemide  100 mg Oral Daily  . gabapentin  100-200 mg Oral QHS  . insulin aspart  0-9 Units Subcutaneous Q4H  . lactulose  30 g Oral TID  . metolazone  2.5 mg Oral Daily  . multivitamin with minerals  1 tablet Oral Daily  . pantoprazole  40 mg Oral QAC breakfast  . potassium chloride SA  20 mEq Oral Daily  . rifaximin  550 mg Oral BID  . spironolactone  300 mg Oral Daily  . traZODone  50 mg Oral QHS   Continuous Infusions: . ciprofloxacin 400 mg (07/03/16 0550)   PRN Meds:.bisacodyl, diphenhydrAMINE, HYDROmorphone (DILAUDID) injection, ondansetron (ZOFRAN) IV    Anti-infectives    Start     Dose/Rate Route Frequency Ordered Stop   07/02/16 1000  rifaximin (XIFAXAN) tablet 550 mg     550 mg Oral 2 times daily 07/02/16 0504     07/02/16 0515  ciprofloxacin (CIPRO) IVPB 400 mg     400 mg 200  mL/hr over 60 Minutes Intravenous Every 12 hours 07/02/16 0504          Objective:   Vitals:   07/02/16 1540 07/02/16 2202 07/03/16 0619 07/03/16 1603  BP: 113/65 (!) 117/59 (!) 103/51 (!) 102/42  Pulse: 71 73 73 73  Resp: 16 18 14 16   Temp: 98.5 F (36.9 C) 97.8 F (36.6 C) 98.1 F (36.7 C) 98.3 F (36.8 C)  TempSrc: Oral Oral Oral Oral  SpO2: 92% 93% 92% 99%  Weight:      Height:        Wt Readings from Last 3 Encounters:  07/01/16 67.1 kg (148 lb)  06/26/16 67.2 kg (148 lb 1.6 oz)  05/24/16 74.9 kg (165 lb 3 oz)     Intake/Output Summary (Last 24 hours) at 07/03/16 1627 Last data filed at 07/03/16 1200  Gross per 24 hour  Intake              720 ml  Output                0 ml  Net              720 ml     Physical Exam  Gen:- Awake Alert,  In no apparent distress , coherent HEENT:- [place].AT, No sclera icterus Neck-Supple  Neck,No JVD,.  Lungs-  CTAB  CV- S1, S2 normal Abd-  +ve B.Sounds, Abd Soft, mild generalized tenderness without rebound or guarding,   no significant distention Extremity/Skin:- No  edema,       Data Review:   Micro Results Recent Results (from the past 240 hour(s))  Urine culture     Status: None   Collection Time: 06/23/16  9:06 PM  Result Value Ref Range Status   Specimen Description URINE, RANDOM  Final   Special Requests NONE  Final   Culture   Final    NO GROWTH Performed at [facility], [address] [place], [place] [address]    Report Status 06/25/2016 FINAL  Final  Gram stain     Status: None   Collection Time: 06/23/16 10:43 PM  Result Value Ref Range Status   Specimen Description PERITONEAL  Final   Special Requests NONE  Final   Gram Stain   Final    ABUNDANT WBC PRESENT, PREDOMINANTLY MONONUCLEAR NO ORGANISMS SEEN    Report Status 06/24/2016 FINAL  Final  Culture, body fluid-bottle     Status: None   Collection Time: 06/23/16 10:43 PM  Result Value Ref Range Status   Specimen Description PERITONEAL ABDOMEN  Final   Special Requests   Final    BOTTLES DRAWN AEROBIC ONLY Blood Culture adequate volume   Culture NO GROWTH 5 DAYS  Final   Report Status 06/28/2016 FINAL  Final    Radiology Reports Ct Abdomen Pelvis Wo Contrast  Result Date: 07/02/2016 CLINICAL DATA:  History of hepatic cirrhosis and ascites now complains of constant gradually worsening abdominal pain for 2 days. Previously admitted for this problem 1 week ago without improvement. Weekly paracentesis but none since April 17. Allergic to IV contrast material. EXAM: CT ABDOMEN AND PELVIS WITHOUT CONTRAST TECHNIQUE: Multidetector CT imaging of the abdomen and pelvis was performed following the standard protocol without IV contrast. COMPARISON:  08/19/2015 FINDINGS: Lower chest: Moderate size right pleural effusion with consolidation or atelectasis in the right lung base. There is mild  progression since previous study. Hepatobiliary: Changes of hepatic cirrhosis with nodular liver contour and prominence of  the lateral segment left lobe and caudate lobes. No focal liver lesions are identified on noncontrast imaging. Surgical absence of the gallbladder. No bile duct dilatation. Pancreas: Unremarkable. No pancreatic ductal dilatation or surrounding inflammatory changes. Spleen: Marked splenic enlargement. Adrenals/Urinary Tract: No adrenal gland nodules. Multiple intrarenal stones in the right kidney, largest measuring 7 mm. No ureteral stones identified. No hydronephrosis or hydroureter. Bladder wall is not thickened. 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 Moderate fluid in the excluded stomach may be due to retrograde flow. No contrast material is demonstrated. Stomach, small bowel, and colon are not abnormally distended. Contrast material flows to the cecum without evidence of obstruction. Stool throughout the colon. There is diffuse small bowel wall thickening and right colonic wall thickening which probably relates to ascites. Enteritis not excluded. Vascular/Lymphatic: Aortic atherosclerosis. No enlarged abdominal or pelvic lymph nodes. Reproductive: Status post hysterectomy. No adnexal masses. Other: Diffuse moderate abdominal ascites, similar to prior study. Prominent abdominal and esophageal varices. No free air in the abdomen. Musculoskeletal: Degenerative changes in the spine. No destructive bone lesions. IMPRESSION: Hepatic cirrhosis with prominent portal venous hypertension including prominent upper abdominal and esophageal varices and diffuse splenic enlargement. Moderate abdominal and pelvic ascites similar to previous study. Diffuse small bowel wall thickening and right colonic wall thickening is probably related to ascites. No evidence of bowel obstruction. Moderate right pleural effusion with consolidation in the right lung base,  increasing since prior study. Electronically Signed   By: Lucienne Capers M.D.   On: 07/02/2016 01:52   Dg Chest 2 View  Result Date: 06/23/2016 CLINICAL DATA:  Weakness, fatigue, intermittent confusion. History of cirrhosis. EXAM: CHEST  2 VIEW COMPARISON:  Chest radiographs 05/22/2016 FINDINGS: Moderate right pleural effusion and basilar opacity. Left lung is clear. Unchanged heart size and mediastinal contours. No definite pulmonary edema. No pneumothorax. No acute osseous abnormality. IMPRESSION: Moderate right pleural effusion and basilar opacity, likely atelectasis. In a patient with cirrhosis this is likely passive effusion in the setting of intra-abdominal ascites. Electronically Signed   By: Jeb Levering M.D.   On: 06/23/2016 21:38   Ct Head Wo Contrast  Result Date: 06/23/2016 CLINICAL DATA:  Fatigue, weakness, altered mental steps since yesterday. History of cirrhosis. EXAM: CT HEAD WITHOUT CONTRAST TECHNIQUE: Contiguous axial images were obtained from the base of the skull through the vertex without intravenous contrast. COMPARISON:  Head CT 02/23/2007 FINDINGS: Brain: No evidence of acute infarction, hemorrhage, hydrocephalus, extra-axial collection or mass lesion/mass effect. Vascular: Mild atherosclerosis of skullbase vasculature without hyperdense vessel or abnormal calcification. Skull: Normal. Negative for fracture or focal lesion. Sinuses/Orbits: Paranasal sinuses and mastoid air cells are clear. The visualized orbits are unremarkable. Other: None. IMPRESSION: No acute intracranial abnormality. Electronically Signed   By: Jeb Levering M.D.   On: 06/23/2016 21:30     CBC  Recent Labs Lab 07/01/16 2252 07/02/16 0651 07/03/16 1404  WBC 4.8 5.0 5.2  HGB 9.4* 9.7* 9.0*  HCT 28.8* 29.6* 27.5*  PLT 68* 71* 69*  MCV 81.1 81.5 81.1  MCH 26.5 26.7 26.5  MCHC 32.6 32.8 32.7  RDW 17.0* 17.1* 16.8*    Chemistries   Recent Labs Lab 07/01/16 2252 07/02/16 0651  07/03/16 1404  NA 133* 130* 128*  K 3.4* 3.4* 3.7  CL 97* 92* 93*  CO2 27 29 27   GLUCOSE 257* 184* 216*  BUN 22* 19 25*  CREATININE 1.28* 1.04* 1.27*  CALCIUM 8.2* 8.0* 8.6*  AST 35 32 26  ALT 32 34 31  ALKPHOS 95 98 86  BILITOT 1.0 1.3* 1.0   ------------------------------------------------------------------------------------------------------------------ No results for input(s): CHOL, HDL, LDLCALC, TRIG, CHOLHDL, LDLDIRECT in the last 72 hours.  Lab Results  Component Value Date   HGBA1C 6.2 (H) 06/23/2016   ------------------------------------------------------------------------------------------------------------------ No results for input(s): TSH, T4TOTAL, T3FREE, THYROIDAB in the last 72 hours.  Invalid input(s): FREET3 ------------------------------------------------------------------------------------------------------------------ No results for input(s): VITAMINB12, FOLATE, FERRITIN, TIBC, IRON, RETICCTPCT in the last 72 hours.  Coagulation profile No results for input(s): INR, PROTIME in the last 168 hours.  No results for input(s): DDIMER in the last 72 hours.  Cardiac Enzymes No results for input(s): CKMB, TROPONINI, MYOGLOBIN in the last 168 hours.  Invalid input(s): CK ------------------------------------------------------------------------------------------------------------------ No results found for: BNP   [name] M.D on 07/03/2016 at 4:27 PM  Between 7am to 7pm - Pager - [phone]  After 7pm go to [email or web address] - password [email or web address]  [facility] -  Office  [phone]   Voice Recognition Viviann Spare dictation system was used to create this note, attempts have been made to correct errors. Please contact the author with questions and/or clarifications.

## 2016-07-04 LAB — COMPREHENSIVE METABOLIC PANEL
ALBUMIN: 2.8 g/dL — AB (ref 3.5–5.0)
ALT: 29 U/L (ref 14–54)
ANION GAP: 7 (ref 5–15)
AST: 26 U/L (ref 15–41)
Alkaline Phosphatase: 77 U/L (ref 38–126)
BUN: 23 mg/dL — ABNORMAL HIGH (ref 6–20)
CO2: 29 mmol/L (ref 22–32)
Calcium: 8.2 mg/dL — ABNORMAL LOW (ref 8.9–10.3)
Chloride: 93 mmol/L — ABNORMAL LOW (ref 101–111)
Creatinine, Ser: 1.16 mg/dL — ABNORMAL HIGH (ref 0.44–1.00)
GFR calc non Af Amer: 52 mL/min — ABNORMAL LOW (ref >60)
GLUCOSE: 183 mg/dL — AB (ref 65–99)
Potassium: 3.4 mmol/L — ABNORMAL LOW (ref 3.5–5.1)
SODIUM: 129 mmol/L — AB (ref 135–145)
Total Bilirubin: 1 mg/dL (ref 0.3–1.2)
Total Protein: 6 g/dL — ABNORMAL LOW (ref 6.5–8.1)

## 2016-07-04 LAB — CBC
HCT: 26 % — ABNORMAL LOW (ref 36.0–46.0)
Hemoglobin: 8.2 g/dL — ABNORMAL LOW (ref 12.0–15.0)
MCH: 26.1 pg (ref 26.0–34.0)
MCHC: 31.5 g/dL (ref 30.0–36.0)
MCV: 82.8 fL (ref 78.0–100.0)
Platelets: 62 10*3/uL — ABNORMAL LOW (ref 150–400)
RBC: 3.14 MIL/uL — ABNORMAL LOW (ref 3.87–5.11)
RDW: 16.5 % — AB (ref 11.5–15.5)
WBC: 4.1 10*3/uL (ref 4.0–10.5)

## 2016-07-04 LAB — GLUCOSE, CAPILLARY
GLUCOSE-CAPILLARY: 231 mg/dL — AB (ref 65–99)
GLUCOSE-CAPILLARY: 202 mg/dL — AB (ref 65–99)
Glucose-Capillary: 197 mg/dL — ABNORMAL HIGH (ref 65–99)
Glucose-Capillary: 272 mg/dL — ABNORMAL HIGH (ref 65–99)

## 2016-07-04 MED ORDER — POTASSIUM CHLORIDE CRYS ER 20 MEQ PO TBCR: 20.0000 meq | EXTENDED_RELEASE_TABLET | Freq: Every day | ORAL | 1 refills | Status: DC

## 2016-07-04 MED ORDER — POTASSIUM CHLORIDE CRYS ER 20 MEQ PO TBCR
40.0000 meq | EXTENDED_RELEASE_TABLET | Freq: Once | ORAL | Status: AC
  Administered 2016-07-04: 40 meq via ORAL
  Filled 2016-07-04: qty 2

## 2016-07-04 MED ORDER — ENSURE ENLIVE PO LIQD: 237.0000 mL | Freq: Two times a day (BID) | ORAL | 12 refills | Status: DC

## 2016-07-04 MED ORDER — LACTULOSE 10 GM/15ML PO SOLN: 30.0000 g | Freq: Three times a day (TID) | ORAL | 0 refills | Status: DC

## 2016-07-04 MED ORDER — LEVOFLOXACIN 250 MG PO TABS: 500.0000 mg | ORAL_TABLET | Freq: Every day | ORAL | 0 refills | Status: DC

## 2016-07-04 NOTE — Discharge Summary (Signed)
[name], is a 56 y.o. female  DOB 03-06-1960  MRN [id number].  Admission date:  07/01/2016  Admitting Physician  Orvan Falconer, MD  Discharge Date:  07/04/2016   Primary MD  Redmond School, MD  Recommendations for primary care physician for things to follow:   Repeat CMP and CBC with PCP in one week   Admission Diagnosis  Generalized abdominal pain [R10.84]   Discharge Diagnosis  Generalized abdominal pain [R10.84]    Principal Problem:   Abdominal pain Active Problems:   Hepatic encephalopathy (HCC)   SBP (spontaneous bacterial peritonitis) ([place])   History of hepatitis C - successfuly treated medically   Gastric ulcer   Diabetes mellitus type 2, controlled, without complications ([place])   Alcoholic cirrhosis of liver with ascites ([place])      Past Medical History:  Diagnosis Date  . Anemia   . Anxiety   . Ascites   . Cirrhosis ([place])    non alcoholic  . Complication of anesthesia   . Depression   . Diabetes mellitus type 2, controlled, without complications ([place])   . Enteropathy 07/31/2015   Portal hypertensive enteropathy per capsule study, with stigmata of bleeding.  . Esophageal varices ([place])   . Gastric ulcer 07/15/2015   per EGD; no stigmata of bleeding  . GI bleed 07/17/2015  . Gram-negative bacteremia 12/08/2011  . FYBOFBPZ(025.8)    "monthly" (11/07/2012)  . Heart murmur   . Hepatitis C    Hx: of Hep "C" it was eradicated  . History of hepatitis C - successfuly treated medically 07/14/2015  . Kidney stones   . Liver failure ([place])    "I'm on liver transplant list @ [facility]" (11/07/2012)  . PONV (postoperative nausea and vomiting)   . Portal vein thrombosis   . Renal insufficiency   . Right ureteral stone 12/07/2011  . 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 Type II diabetes mellitus ([place])    "not since gastric bypass" (11/07/2012)    Past Surgical History:  Procedure  Laterality Date  . ABDOMINAL HYSTERECTOMY  2003  . CESAREAN SECTION  1995  . CHOLECYSTECTOMY  1987  . COLONOSCOPY    . COLONOSCOPY  10/21/2011   Procedure: COLONOSCOPY;  Surgeon: Rogene Houston, MD;  [facility];  Service: Endoscopy;  Laterality: N/A;  245   . CYSTOSCOPY W/ RETROGRADES  12/08/2011   Procedure: CYSTOSCOPY WITH RETROGRADE PYELOGRAM;  Surgeon: Marissa Nestle, MD;  Location: [facility] OR[facility];  Service: Urology;  Laterality: Right;  . CYSTOSCOPY WITH STENT PLACEMENT  12/08/2011   Procedure: CYSTOSCOPY WITH STENT PLACEMENT;  Surgeon: Marissa Nestle, MD;  Location: [facility];  Service: Urology;  Laterality: Right;  . DILATION AND CURETTAGE OF UTERUS    . ESOPHAGOGASTRODUODENOSCOPY N/A 02/19/2014   Procedure: ESOPHAGOGASTRODUODENOSCOPY (EGD);  Surgeon: Rogene Houston, MD;  [facility];  Service: Endoscopy;  Laterality: N/A;  100  . ESOPHAGOGASTRODUODENOSCOPY N/A 07/15/2015   Procedure: ESOPHAGOGASTRODUODENOSCOPY (EGD);  Surgeon: Rogene Houston, MD;  Location: [facility];  Service: Endoscopy;  Laterality: N/A;  . ESOPHAGOGASTRODUODENOSCOPY N/A 07/29/2015   Procedure: ESOPHAGOGASTRODUODENOSCOPY (EGD);  Surgeon: Rogene Houston, MD;  [facility];  Service: Endoscopy;  Laterality: N/A;  . GASTRIC BYPASS  ~ 1995  . HERNIA REPAIR  04/14/11   "one in my bellybutton" (11/07/2012)  . INGUINAL HERNIA REPAIR Right    "maybe 2" (11/07/2012)  . KNEE ARTHROSCOPY Right   . LITHOTRIPSY     "several times" (11/07/2012)  . OPEN REDUCTION INTERNAL FIXATION (ORIF) DISTAL RADIAL FRACTURE Right 11/07/2012   Procedure: OPEN REDUCTION INTERNAL FIXATION (ORIF) RIGHT DISTAL RADIAL FRACTURE;  Surgeon: [name], MD;  Location: [facility];  Service: Orthopedics;  Laterality: Right;  . ORIF DISTAL RADIUS FRACTURE Right 11/07/2012  . UPPER GASTROINTESTINAL ENDOSCOPY         HPI  from the history and physical done on the day of admission:     Chief Complaint:  Abdominal pain,  more severe than usual.   HPI:  56 year old female with a history of chronic hepatitis C with hepatic cirrhosis, portal vein thrombosis, esophageal varices presents with several days history of excruciating abdominal pain. She had an episode of spontaneous bacterial peritonitis (SBP) and had several admissions for same, but her last admission was due to hepatic encephalopathy. She has been on Bactrim DS as prophylaxis. Her gastroenterologist is Dr Dereck Leep and she has been on the liver transplant list. She endorses compliance with all her medications. She denies any chest pain, shortness breath, vomiting, diarrhea, hematochezia, melena, hematemesis, headache, dizziness. There is no dysuria or hematuria. Work up in the ER included no fever, no leukocytosis, and abdominal pelvic CT showed moderate amount of ascites, but US guided paracentesis did not show enough fluid to perform bedside paracentesis.  She also has no tense abdomen as she had in the past, when she has her weekly paracentesis.  Since her diuretics were adjsuted, she did not require as frequent paracentesis.   Hospitalist was asked to admit her for abdominal pain    Hospital Course:    1)Abdominal pain:  Suspect secondary to some degree of Constipation, Patient admits to noncompliance with lactulose at home,  she is on opiates  Chronically making the constipation worse. CT of the abdomen also showed significant stool burden. Overall improved with increased doses of lactulose, compliance with lactulose advised. Low clinical index of suspicion for spontaneous bacterial peritonitis,  the bedside US did not show adequate fluid to tap, and she is allergic to PCN, discharged on Levaquin. No fever, abdominal pain is better, no vomiting  2)Liver Cirrhosis: Continue with Xifaxan and lactulose, . NH3 level is 78, better than last admission, despite noncompliance with lactulose at home and elevated ammonia levels ( below her recent baseline ) she is  certainly not encephalopathic.   3)DM: Resume home regimen   4)H/o Hepatitis C: S/p succesful Tx per record.    DVT prophylaxis:SCD.  Code Status:FULL CODE.  Disposition Plan:to home.    Discharge Condition: stable  Follow UP  Follow-up Information    Redmond School, MD. Schedule an appointment as soon as possible for a visit in 2 week(s).   Specialty:  Internal Medicine Contact information: [address] [place] [address] [phone]           Diet and Activity recommendation:  As advised  Discharge Instructions    Take Medications as prescribed Avoid constipation Discharge Instructions    Call MD for:  difficulty breathing, headache or visual  disturbances    Complete by:  As directed    Call MD for:  persistant dizziness or light-headedness    Complete by:  As directed    Call MD for:  persistant nausea and vomiting    Complete by:  As directed    Call MD for:  redness, tenderness, or signs of infection (pain, swelling, redness, odor or green/yellow discharge around incision site)    Complete by:  As directed    Call MD for:  temperature >100.4    Complete by:  As directed    Diet Carb Modified    Complete by:  As directed    Discharge instructions    Complete by:  As directed    Take Medications as prescribed Avoid constipation   Increase activity slowly    Complete by:  As directed         Discharge Medications     Allergies as of 07/04/2016      Reactions   Ancef [cefazolin Sodium] Other (See Comments)   Reaction:  Vaginal and mouth blisters   Gadobenate Itching, Nausea And Vomiting, Rash   Iodinated Diagnostic Agents Itching, Nausea And Vomiting, Rash   Tape Rash      Medication List    TAKE these medications   b complex vitamins tablet Take 1 tablet by mouth daily.   escitalopram 20 MG tablet Commonly known as:  LEXAPRO Take 20 mg by mouth daily.   feeding supplement (ENSURE ENLIVE) Liqd Take 237 mLs by mouth  2 (two) times daily between meals.   ferrous sulfate 325 (65 FE) MG tablet Take 325 mg by mouth daily with breakfast.   furosemide 40 MG tablet Commonly known as:  LASIX Take 2.5 tablets (100 mg total) by mouth daily.   gabapentin 100 MG capsule Commonly known as:  NEURONTIN Take 100-200 mg by mouth at bedtime.   insulin glargine 100 unit/mL Sopn Commonly known as:  LANTUS Inject 20 Units into the skin at bedtime.   lactulose 10 GM/15ML solution Commonly known as:  CHRONULAC Take 45 mLs (30 g total) by mouth 3 (three) times daily. To achieve 3 bowel movements a day   levofloxacin 250 MG tablet Commonly known as:  LEVAQUIN Take 2 tablets (500 mg total) by mouth daily. X 10 days , then 250mg  qod after that What changed:  how much to take  when to take this  additional instructions   metolazone 2.5 MG tablet Commonly known as:  ZAROXOLYN TAKE 1 TABLET EVERY 4TH DAY.   multivitamin with minerals Tabs tablet Take 1 tablet by mouth daily.   Oxycodone HCl 10 MG Tabs Take 10 mg by mouth every 6 (six) hours as needed (for pain).   pantoprazole 40 MG tablet Commonly known as:  PROTONIX Take 1 tablet (40 mg total) by mouth daily before breakfast.   potassium chloride SA 20 MEQ tablet Commonly known as:  K-DUR,KLOR-CON Take 1 tablet (20 mEq total) by mouth daily.   rifaximin 550 MG Tabs tablet Commonly known as:  XIFAXAN Take 1 tablet (550 mg total) by mouth 2 (two) times daily.   spironolactone 100 MG tablet Commonly known as:  ALDACTONE Take 3 tablets (300 mg total) by mouth daily.   traZODone 50 MG tablet Commonly known as:  DESYREL Take 1 tablet (50 mg total) by mouth at bedtime.       Major procedures and Radiology Reports - PLEASE review detailed and final reports for all details, in brief -  Ct Abdomen Pelvis Wo Contrast  Result Date: 07/02/2016 CLINICAL DATA:  History of hepatic cirrhosis and ascites now complains of constant gradually worsening  abdominal pain for 2 days. Previously admitted for this problem 1 week ago without improvement. Weekly paracentesis but none since April 17. Allergic to IV contrast material. EXAM: CT ABDOMEN AND PELVIS WITHOUT CONTRAST TECHNIQUE: Multidetector CT imaging of the abdomen and pelvis was performed following the standard protocol without IV contrast. COMPARISON:  08/19/2015 FINDINGS: Lower chest: Moderate size right pleural effusion with consolidation or atelectasis in the right lung base. There is mild progression since previous study. Hepatobiliary: Changes of hepatic cirrhosis with nodular liver contour and prominence of the lateral segment left lobe and caudate lobes. No focal liver lesions are identified on noncontrast imaging. Surgical absence of the gallbladder. No bile duct dilatation. Pancreas: Unremarkable. No pancreatic ductal dilatation or surrounding inflammatory changes. Spleen: Marked splenic enlargement. Adrenals/Urinary Tract: No adrenal gland nodules. Multiple intrarenal stones in the right kidney, largest measuring 7 mm. No ureteral stones identified. No hydronephrosis or hydroureter. Bladder wall is not thickened. 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 Moderate fluid in the excluded stomach may be due to retrograde flow. No contrast material is demonstrated. Stomach, small bowel, and colon are not abnormally distended. Contrast material flows to the cecum without evidence of obstruction. Stool throughout the colon. There is diffuse small bowel wall thickening and right colonic wall thickening which probably relates to ascites. Enteritis not excluded. Vascular/Lymphatic: Aortic atherosclerosis. No enlarged abdominal or pelvic lymph nodes. Reproductive: Status post hysterectomy. No adnexal masses. Other: Diffuse moderate abdominal ascites, similar to prior study. Prominent abdominal and esophageal varices. No free air in the abdomen. Musculoskeletal:  Degenerative changes in the spine. No destructive bone lesions. IMPRESSION: Hepatic cirrhosis with prominent portal venous hypertension including prominent upper abdominal and esophageal varices and diffuse splenic enlargement. Moderate abdominal and pelvic ascites similar to previous study. Diffuse small bowel wall thickening and right colonic wall thickening is probably related to ascites. No evidence of bowel obstruction. Moderate right pleural effusion with consolidation in the right lung base, increasing since prior study. Electronically Signed   By: Lucienne Capers M.D.   On: 07/02/2016 01:52   Dg Chest 2 View  Result Date: 06/23/2016 CLINICAL DATA:  Weakness, fatigue, intermittent confusion. History of cirrhosis. EXAM: CHEST  2 VIEW COMPARISON:  Chest radiographs 05/22/2016 FINDINGS: Moderate right pleural effusion and basilar opacity. Left lung is clear. Unchanged heart size and mediastinal contours. No definite pulmonary edema. No pneumothorax. No acute osseous abnormality. IMPRESSION: Moderate right pleural effusion and basilar opacity, likely atelectasis. In a patient with cirrhosis this is likely passive effusion in the setting of intra-abdominal ascites. Electronically Signed   By: Jeb Levering M.D.   On: 06/23/2016 21:38   Ct Head Wo Contrast  Result Date: 06/23/2016 CLINICAL DATA:  Fatigue, weakness, altered mental steps since yesterday. History of cirrhosis. EXAM: CT HEAD WITHOUT CONTRAST TECHNIQUE: Contiguous axial images were obtained from the base of the skull through the vertex without intravenous contrast. COMPARISON:  Head CT 02/23/2007 FINDINGS: Brain: No evidence of acute infarction, hemorrhage, hydrocephalus, extra-axial collection or mass lesion/mass effect. Vascular: Mild atherosclerosis of skullbase vasculature without hyperdense vessel or abnormal calcification. Skull: Normal. Negative for fracture or focal lesion. Sinuses/Orbits: Paranasal sinuses and mastoid air cells are  clear. The visualized orbits are unremarkable. Other: None. IMPRESSION: No acute intracranial abnormality. Electronically Signed   By: Jeb Levering M.D.   On: 06/23/2016  21:30    Micro Results   No results found for this or any previous visit (from the past 240 hour(s)).   Today   Subjective    [name] today has no new complaints          Patient has been seen and examined prior to discharge   Objective   Blood pressure (!) 106/57, pulse 74, temperature 98.2 F (36.8 C), resp. rate 20, height 5\' 6"  (1.676 m), weight 67.1 kg (148 lb), SpO2 100 %.   Intake/Output Summary (Last 24 hours) at 07/04/16 1425 Last data filed at 07/03/16 1815  Gross per 24 hour  Intake              840 ml  Output                0 ml  Net              840 ml    Exam Gen:- Awake  In no apparent distress  HEENT:- [place].AT,   Neck-Supple Neck,No JVD,  Lungs- diminished in bases, no significant adventitious sounds CV- S1, S2 normal Abd-  +ve B.Sounds, Abd Soft, improved generalized tenderness, no rebound or guarding, not particularly distended at this time   Extremity/Skin:- Intact peripheral pulses     Data Review   CBC w Diff: Lab Results  Component Value Date   WBC 4.1 07/04/2016   HGB 8.2 (L) 07/04/2016   HCT 26.0 (L) 07/04/2016   PLT 62 (L) 07/04/2016   LYMPHOPCT 12 06/26/2016   MONOPCT 9 06/26/2016   EOSPCT 2 06/26/2016   BASOPCT 1 06/26/2016    CMP: Lab Results  Component Value Date   NA 129 (L) 07/04/2016   K 3.4 (L) 07/04/2016   CL 93 (L) 07/04/2016   CO2 29 07/04/2016   BUN 23 (H) 07/04/2016   CREATININE 1.16 (H) 07/04/2016   CREATININE 1.17 (H) 05/24/2016   PROT 6.0 (L) 07/04/2016   ALBUMIN 2.8 (L) 07/04/2016   BILITOT 1.0 07/04/2016   ALKPHOS 77 07/04/2016   AST 26 07/04/2016   ALT 29 07/04/2016  .   Total Discharge time is about 33 minutes  Angelita Harnack M.D on 07/04/2016 at 2:25 PM  [facility]   Office  [phone]  Voice Recognition  Viviann Spare dictation system was used to create this note, attempts have been made to correct errors. Please contact the author with questions and/or clarifications.

## 2016-07-04 NOTE — Care Management Important Message (Signed)
Important Message  Patient Details  Name: [name] MRN: [id number] Date of Birth: 03/23/1960   Medicare Important Message Given:  Yes    Meghann [place]ing, Chauncey Reading, RN 07/04/2016, 2:55 PM

## 2016-07-04 NOTE — Progress Notes (Signed)
Patient states understanding of discharge instructions.  

## 2016-07-04 NOTE — Discharge Instructions (Signed)
Take Medications as prescribed Avoid constipation

## 2016-07-05 ENCOUNTER — Encounter (INDEPENDENT_AMBULATORY_CARE_PROVIDER_SITE_OTHER): Payer: Self-pay | Admitting: Internal Medicine

## 2016-07-05 ENCOUNTER — Encounter (INDEPENDENT_AMBULATORY_CARE_PROVIDER_SITE_OTHER): Payer: Self-pay | Admitting: *Deleted

## 2016-07-05 ENCOUNTER — Other Ambulatory Visit (INDEPENDENT_AMBULATORY_CARE_PROVIDER_SITE_OTHER): Payer: Self-pay | Admitting: *Deleted

## 2016-07-05 ENCOUNTER — Ambulatory Visit (INDEPENDENT_AMBULATORY_CARE_PROVIDER_SITE_OTHER): Payer: Medicare Other | Admitting: Internal Medicine

## 2016-07-05 VITALS — BP 98/70 | HR 64 | Temp 97.9°F | Resp 18 | Ht 66.0 in | Wt 138.3 lb

## 2016-07-05 DIAGNOSIS — D649 Anemia, unspecified: Secondary | ICD-10-CM

## 2016-07-05 DIAGNOSIS — K7460 Unspecified cirrhosis of liver: Secondary | ICD-10-CM | POA: Diagnosis not present

## 2016-07-05 DIAGNOSIS — R188 Other ascites: Secondary | ICD-10-CM

## 2016-07-05 DIAGNOSIS — K7290 Hepatic failure, unspecified without coma: Secondary | ICD-10-CM | POA: Diagnosis not present

## 2016-07-05 DIAGNOSIS — R1084 Generalized abdominal pain: Secondary | ICD-10-CM | POA: Diagnosis not present

## 2016-07-05 DIAGNOSIS — K7682 Hepatic encephalopathy: Secondary | ICD-10-CM

## 2016-07-05 NOTE — Patient Instructions (Addendum)
Do not take metolazone this week. Take half her dose of diuretic(furosemide) for next 3 days and then back to usual dose. CBC and metabolic 7 in 2 weeks. Call for abdominal distention.

## 2016-07-05 NOTE — Progress Notes (Signed)
Presenting complaint;  Follow-up for multiple problems.  Database and Subjective:  Patient is 56 year old Caucasian female who was decompensated cirrhosis who is listed for liver and small bowel transplant at [facility] is here for scheduled visit. About 2 weeks ago she was hospitalized for hepatic encephalopathy and over the weekend briefly for abdominal pain. She was felt to be constipated. She was discharged on daily Levaquin for 10 days and thereafter every other day. She did not undergo abdominal tap. She has history of SBP but abdominal tap on 05/25/2016 was negative for SBP. Does not feel well. She has generalized abdominal pain. She only has had 2 bowel movements in the last 3 days. She denies nausea vomiting melena or rectal bleeding. She has generalized abdominal pain but is more so in upper quadrants. She denies fever or chills. She feels extremely tired. She is able to do very little at home. She says she is #1 on the waiting list and could get a call from [facility] any minute. She does not take pain medication daily.    Current Medications: Outpatient Encounter Prescriptions as of 07/05/2016  Medication Sig  . b complex vitamins tablet Take 1 tablet by mouth daily.   Marland Kitchen escitalopram (LEXAPRO) 20 MG tablet Take 20 mg by mouth daily.   . feeding supplement, ENSURE ENLIVE, (ENSURE ENLIVE) LIQD Take 237 mLs by mouth 2 (two) times daily between meals.  . ferrous sulfate 325 (65 FE) MG tablet Take 325 mg by mouth daily with breakfast.   . furosemide (LASIX) 40 MG tablet Take 2.5 tablets (100 mg total) by mouth daily.  Marland Kitchen gabapentin (NEURONTIN) 100 MG capsule Take 100-200 mg by mouth at bedtime.   . insulin glargine (LANTUS) 100 unit/mL SOPN Inject 20 Units into the skin at bedtime.  Marland Kitchen lactulose (CHRONULAC) 10 GM/15ML solution Take 45 mLs (30 g total) by mouth 3 (three) times daily. To achieve 3 bowel movements a day  . levofloxacin (LEVAQUIN) 250 MG tablet Take 2 tablets (500 mg total) by mouth  daily. X 10 days , then 250mg  qod after that  . metolazone (ZAROXOLYN) 2.5 MG tablet TAKE 1 TABLET EVERY 4TH DAY.  . Multiple Vitamin (MULTIVITAMIN WITH MINERALS) TABS tablet Take 1 tablet by mouth daily.   . Oxycodone HCl 10 MG TABS Take 10 mg by mouth every 6 (six) hours as needed (for pain).   . pantoprazole (PROTONIX) 40 MG tablet Take 1 tablet (40 mg total) by mouth daily before breakfast.  . potassium chloride SA (K-DUR,KLOR-CON) 20 MEQ tablet Take 1 tablet (20 mEq total) by mouth daily.  . rifaximin (XIFAXAN) 550 MG TABS tablet Take 1 tablet (550 mg total) by mouth 2 (two) times daily.  Marland Kitchen spironolactone (ALDACTONE) 100 MG tablet Take 3 tablets (300 mg total) by mouth daily.  . traZODone (DESYREL) 50 MG tablet Take 1 tablet (50 mg total) by mouth at bedtime.   No facility-administered encounter medications on file as of 07/05/2016.      Objective: Blood pressure 98/70, pulse 64, temperature 97.9 F (36.6 C), temperature source Oral, resp. rate 18, height 5\' 6"  (1.676 m), weight 138 lb 4.8 oz (62.7 kg). Patient is alert. She appears to be chronically ill. She does not have asterixis. Conjunctiva is pink. Sclera is nonicteric Oropharyngeal mucosa is normal. No neck masses or thyromegaly noted. Cardiac exam with regular rhythm normal S1 and S2. She has grade 2/6 systolic ejection murmur best heard at left sternal border. Lungs are clear to auscultation. Abdomen is  full but not tense. Bowel sounds are normal. Both flanks are bulging. She has generalized tenderness without guarding. Very large spleen which is slightly tender but no rub noted. No LE edema or clubbing noted.  Labs/studies Results:   Recent Labs  07/03/16 1404 07/04/16 0540  WBC 5.2 4.1  HGB 9.0* 8.2*  HCT 27.5* 26.0*  PLT 69* 62*    BMET   Recent Labs  07/03/16 1404 07/04/16 0540  NA 128* 129*  K 3.7 3.4*  CL 93* 93*  CO2 27 29  GLUCOSE 216* 183*  BUN 25* 23*  CREATININE 1.27* 1.16*  CALCIUM 8.6*  8.2*    LFT   Recent Labs  07/03/16 1404 07/04/16 0540  PROT 6.2* 6.0*  ALBUMIN 2.8* 2.8*  AST 26 26  ALT 31 29  ALKPHOS 86 77  BILITOT 1.0 1.0     Assessment:  #1.Advanced cirrhosis with ascites. She has end-stage disease and is waiting for liver and small bowel transplant at [facility]. She is actively listed. Last LVAP was about 6 weeks ago. Ascites does not appear to be tense. If anything she may be on the dry ascites. #2. Hepatic encephalopathy. Clinically she appears to be at baseline. #3. Anemia. She has anemia of chronic disease. Recent hemoglobin low. No evidence of overt GI bleeding.  #4. Generalized abdominal pain. She has history of chronic abdominal pain both in right upper and right low quadrant. She was briefly hospitalized over the weekend and thought to have constipation which I do not believe this is source of her pain. She is on empiric therapy with Levaquin for SBP. I am not sure why she did not undergo diagnostic abdominal paracentesis. #5. History of colonic adenomas. Last colonoscopy was at [facility] in January 2017.She had barium enema in January this ear revealing colonic diverticulosis but no other abnormalities.  Plan:  Do not take metolazone this week. Decrease furosemide to 50 mg daily for 3-4 days and that after Dr. 100 mg by mouth daily. CBC and metabolic 7 in 2 weeks. Patient will check with transplant coordinator whether or not she needs colonoscopy(she had colonoscopy in January 27 at [facility] with removal of tubular adenomas and I do not believe she needs an exam for another year and a half).  Office visit in 2 months.

## 2016-07-19 DIAGNOSIS — K7460 Unspecified cirrhosis of liver: Secondary | ICD-10-CM | POA: Diagnosis not present

## 2016-07-19 DIAGNOSIS — D649 Anemia, unspecified: Secondary | ICD-10-CM | POA: Diagnosis not present

## 2016-07-20 ENCOUNTER — Other Ambulatory Visit (INDEPENDENT_AMBULATORY_CARE_PROVIDER_SITE_OTHER): Payer: Self-pay | Admitting: *Deleted

## 2016-07-20 ENCOUNTER — Encounter (INDEPENDENT_AMBULATORY_CARE_PROVIDER_SITE_OTHER): Payer: Self-pay | Admitting: *Deleted

## 2016-07-20 DIAGNOSIS — K7460 Unspecified cirrhosis of liver: Secondary | ICD-10-CM

## 2016-07-20 DIAGNOSIS — R188 Other ascites: Secondary | ICD-10-CM

## 2016-07-20 LAB — CBC
HEMATOCRIT: 30.9 % — AB (ref 35.0–45.0)
HEMOGLOBIN: 9.4 g/dL — AB (ref 11.7–15.5)
MCH: 25 pg — ABNORMAL LOW (ref 27.0–33.0)
MCHC: 30.4 g/dL — AB (ref 32.0–36.0)
MCV: 82.2 fL (ref 80.0–100.0)
MPV: 10.1 fL (ref 7.5–12.5)
RBC: 3.76 MIL/uL — AB (ref 3.80–5.10)
RDW: 16.3 % — ABNORMAL HIGH (ref 11.0–15.0)
WBC: 3.8 10*3/uL (ref 3.8–10.8)

## 2016-07-20 LAB — BASIC METABOLIC PANEL
BUN: 18 mg/dL (ref 7–25)
CHLORIDE: 100 mmol/L (ref 98–110)
CO2: 23 mmol/L (ref 20–31)
Calcium: 7.9 mg/dL — ABNORMAL LOW (ref 8.6–10.4)
Creat: 1.24 mg/dL — ABNORMAL HIGH (ref 0.50–1.05)
GLUCOSE: 273 mg/dL — AB (ref 65–99)
POTASSIUM: 3.4 mmol/L — AB (ref 3.5–5.3)
Sodium: 132 mmol/L — ABNORMAL LOW (ref 135–146)

## 2016-08-03 ENCOUNTER — Telehealth (INDEPENDENT_AMBULATORY_CARE_PROVIDER_SITE_OTHER): Payer: Self-pay | Admitting: Internal Medicine

## 2016-08-03 NOTE — Telephone Encounter (Signed)
Patient called, stated that Dr. Laural Golden had wanted her to get more labs done, but she will be going to [place] on 08/09/16 and they always draw blood there.  She wanted to know if she still needed to have labs here as well.  Discussed with Tammy and per Tammy if Niharika feels they will draw for the same labs at [facility] to have it done there and they will send results to Dr. Laural Golden.  Patient was advised of this.

## 2016-08-07 DIAGNOSIS — Z9483 Pancreas transplant status: Secondary | ICD-10-CM

## 2016-08-07 DIAGNOSIS — Z944 Liver transplant status: Secondary | ICD-10-CM

## 2016-08-07 DIAGNOSIS — Z9482 Intestine transplant status: Secondary | ICD-10-CM

## 2016-08-07 HISTORY — DX: Pancreas transplant status: Z94.83

## 2016-08-07 HISTORY — DX: Liver transplant status: Z94.4

## 2016-08-07 HISTORY — DX: Intestine transplant status: Z94.82

## 2016-08-09 DIAGNOSIS — K7460 Unspecified cirrhosis of liver: Secondary | ICD-10-CM | POA: Diagnosis not present

## 2016-08-09 DIAGNOSIS — A0472 Enterocolitis due to Clostridium difficile, not specified as recurrent: Secondary | ICD-10-CM | POA: Diagnosis not present

## 2016-08-09 DIAGNOSIS — Z23 Encounter for immunization: Secondary | ICD-10-CM | POA: Diagnosis not present

## 2016-08-09 DIAGNOSIS — B182 Chronic viral hepatitis C: Secondary | ICD-10-CM | POA: Diagnosis not present

## 2016-08-13 DIAGNOSIS — J9819 Other pulmonary collapse: Secondary | ICD-10-CM | POA: Diagnosis not present

## 2016-08-13 DIAGNOSIS — N9489 Other specified conditions associated with female genital organs and menstrual cycle: Secondary | ICD-10-CM | POA: Diagnosis not present

## 2016-08-13 DIAGNOSIS — J90 Pleural effusion, not elsewhere classified: Secondary | ICD-10-CM | POA: Diagnosis not present

## 2016-08-13 DIAGNOSIS — Z949 Transplanted organ and tissue status, unspecified: Secondary | ICD-10-CM | POA: Diagnosis not present

## 2016-08-13 DIAGNOSIS — J9811 Atelectasis: Secondary | ICD-10-CM | POA: Diagnosis not present

## 2016-08-13 DIAGNOSIS — R7989 Other specified abnormal findings of blood chemistry: Secondary | ICD-10-CM | POA: Diagnosis not present

## 2016-08-13 DIAGNOSIS — N179 Acute kidney failure, unspecified: Secondary | ICD-10-CM | POA: Diagnosis not present

## 2016-08-13 DIAGNOSIS — E876 Hypokalemia: Secondary | ICD-10-CM | POA: Diagnosis not present

## 2016-08-13 DIAGNOSIS — I81 Portal vein thrombosis: Secondary | ICD-10-CM | POA: Diagnosis not present

## 2016-08-13 DIAGNOSIS — T86850 Intestine transplant rejection: Secondary | ICD-10-CM | POA: Diagnosis not present

## 2016-08-13 DIAGNOSIS — Z794 Long term (current) use of insulin: Secondary | ICD-10-CM | POA: Diagnosis not present

## 2016-08-13 DIAGNOSIS — I517 Cardiomegaly: Secondary | ICD-10-CM | POA: Diagnosis not present

## 2016-08-13 DIAGNOSIS — R918 Other nonspecific abnormal finding of lung field: Secondary | ICD-10-CM | POA: Diagnosis not present

## 2016-08-13 DIAGNOSIS — Z9049 Acquired absence of other specified parts of digestive tract: Secondary | ICD-10-CM | POA: Diagnosis not present

## 2016-08-13 DIAGNOSIS — E872 Acidosis: Secondary | ICD-10-CM | POA: Diagnosis not present

## 2016-08-13 DIAGNOSIS — R509 Fever, unspecified: Secondary | ICD-10-CM | POA: Diagnosis not present

## 2016-08-13 DIAGNOSIS — H538 Other visual disturbances: Secondary | ICD-10-CM | POA: Diagnosis not present

## 2016-08-13 DIAGNOSIS — K633 Ulcer of intestine: Secondary | ICD-10-CM | POA: Diagnosis not present

## 2016-08-13 DIAGNOSIS — D899 Disorder involving the immune mechanism, unspecified: Secondary | ICD-10-CM | POA: Diagnosis not present

## 2016-08-13 DIAGNOSIS — Z792 Long term (current) use of antibiotics: Secondary | ICD-10-CM | POA: Diagnosis not present

## 2016-08-13 DIAGNOSIS — I9581 Postprocedural hypotension: Secondary | ICD-10-CM | POA: Diagnosis not present

## 2016-08-13 DIAGNOSIS — E119 Type 2 diabetes mellitus without complications: Secondary | ICD-10-CM | POA: Diagnosis not present

## 2016-08-13 DIAGNOSIS — E60 Dietary zinc deficiency: Secondary | ICD-10-CM | POA: Diagnosis not present

## 2016-08-13 DIAGNOSIS — E871 Hypo-osmolality and hyponatremia: Secondary | ICD-10-CM | POA: Diagnosis not present

## 2016-08-13 DIAGNOSIS — Z944 Liver transplant status: Secondary | ICD-10-CM | POA: Diagnosis not present

## 2016-08-13 DIAGNOSIS — Z980 Intestinal bypass and anastomosis status: Secondary | ICD-10-CM | POA: Diagnosis not present

## 2016-08-13 DIAGNOSIS — E1165 Type 2 diabetes mellitus with hyperglycemia: Secondary | ICD-10-CM | POA: Diagnosis not present

## 2016-08-13 DIAGNOSIS — R188 Other ascites: Secondary | ICD-10-CM | POA: Diagnosis not present

## 2016-08-13 DIAGNOSIS — A4189 Other specified sepsis: Secondary | ICD-10-CM | POA: Diagnosis not present

## 2016-08-13 DIAGNOSIS — I471 Supraventricular tachycardia: Secondary | ICD-10-CM | POA: Diagnosis not present

## 2016-08-13 DIAGNOSIS — R69 Illness, unspecified: Secondary | ICD-10-CM | POA: Diagnosis not present

## 2016-08-13 DIAGNOSIS — Z9489 Other transplanted organ and tissue status: Secondary | ICD-10-CM | POA: Diagnosis not present

## 2016-08-13 DIAGNOSIS — Z789 Other specified health status: Secondary | ICD-10-CM | POA: Diagnosis not present

## 2016-08-13 DIAGNOSIS — Z7682 Awaiting organ transplant status: Secondary | ICD-10-CM | POA: Diagnosis not present

## 2016-08-13 DIAGNOSIS — E870 Hyperosmolality and hypernatremia: Secondary | ICD-10-CM | POA: Diagnosis not present

## 2016-08-13 DIAGNOSIS — Z9911 Dependence on respirator [ventilator] status: Secondary | ICD-10-CM | POA: Diagnosis not present

## 2016-08-13 DIAGNOSIS — K529 Noninfective gastroenteritis and colitis, unspecified: Secondary | ICD-10-CM | POA: Diagnosis not present

## 2016-08-13 DIAGNOSIS — M7989 Other specified soft tissue disorders: Secondary | ICD-10-CM | POA: Diagnosis not present

## 2016-08-13 DIAGNOSIS — K7460 Unspecified cirrhosis of liver: Secondary | ICD-10-CM | POA: Diagnosis not present

## 2016-08-13 DIAGNOSIS — D72829 Elevated white blood cell count, unspecified: Secondary | ICD-10-CM | POA: Diagnosis not present

## 2016-08-13 DIAGNOSIS — Z09 Encounter for follow-up examination after completed treatment for conditions other than malignant neoplasm: Secondary | ICD-10-CM | POA: Diagnosis not present

## 2016-08-13 DIAGNOSIS — A419 Sepsis, unspecified organism: Secondary | ICD-10-CM | POA: Diagnosis not present

## 2016-08-13 DIAGNOSIS — M5134 Other intervertebral disc degeneration, thoracic region: Secondary | ICD-10-CM | POA: Diagnosis not present

## 2016-08-13 DIAGNOSIS — R739 Hyperglycemia, unspecified: Secondary | ICD-10-CM | POA: Diagnosis not present

## 2016-08-13 DIAGNOSIS — Z9482 Intestine transplant status: Secondary | ICD-10-CM | POA: Diagnosis not present

## 2016-08-13 DIAGNOSIS — E8770 Fluid overload, unspecified: Secondary | ICD-10-CM | POA: Diagnosis not present

## 2016-08-13 DIAGNOSIS — B182 Chronic viral hepatitis C: Secondary | ICD-10-CM | POA: Diagnosis not present

## 2016-08-13 DIAGNOSIS — T887XXA Unspecified adverse effect of drug or medicament, initial encounter: Secondary | ICD-10-CM | POA: Diagnosis not present

## 2016-08-13 DIAGNOSIS — R51 Headache: Secondary | ICD-10-CM | POA: Diagnosis not present

## 2016-08-13 DIAGNOSIS — K6389 Other specified diseases of intestine: Secondary | ICD-10-CM | POA: Diagnosis not present

## 2016-08-13 DIAGNOSIS — E46 Unspecified protein-calorie malnutrition: Secondary | ICD-10-CM | POA: Diagnosis not present

## 2016-08-13 DIAGNOSIS — T380X5A Adverse effect of glucocorticoids and synthetic analogues, initial encounter: Secondary | ICD-10-CM | POA: Diagnosis not present

## 2016-08-13 DIAGNOSIS — E43 Unspecified severe protein-calorie malnutrition: Secondary | ICD-10-CM | POA: Diagnosis not present

## 2016-08-13 DIAGNOSIS — E878 Other disorders of electrolyte and fluid balance, not elsewhere classified: Secondary | ICD-10-CM | POA: Diagnosis not present

## 2016-08-13 DIAGNOSIS — J811 Chronic pulmonary edema: Secondary | ICD-10-CM | POA: Diagnosis not present

## 2016-08-13 DIAGNOSIS — Z298 Encounter for other specified prophylactic measures: Secondary | ICD-10-CM | POA: Diagnosis not present

## 2016-08-13 DIAGNOSIS — K91870 Postprocedural hematoma of a digestive system organ or structure following a digestive system procedure: Secondary | ICD-10-CM | POA: Diagnosis not present

## 2016-08-13 DIAGNOSIS — J939 Pneumothorax, unspecified: Secondary | ICD-10-CM | POA: Diagnosis not present

## 2016-08-13 DIAGNOSIS — E610 Copper deficiency: Secondary | ICD-10-CM | POA: Diagnosis not present

## 2016-08-13 DIAGNOSIS — D62 Acute posthemorrhagic anemia: Secondary | ICD-10-CM | POA: Diagnosis not present

## 2016-08-13 DIAGNOSIS — Z9884 Bariatric surgery status: Secondary | ICD-10-CM | POA: Diagnosis not present

## 2016-08-13 DIAGNOSIS — R109 Unspecified abdominal pain: Secondary | ICD-10-CM | POA: Diagnosis not present

## 2016-08-14 DIAGNOSIS — K7460 Unspecified cirrhosis of liver: Secondary | ICD-10-CM | POA: Diagnosis not present

## 2016-08-14 DIAGNOSIS — I9581 Postprocedural hypotension: Secondary | ICD-10-CM | POA: Diagnosis not present

## 2016-08-14 DIAGNOSIS — K91870 Postprocedural hematoma of a digestive system organ or structure following a digestive system procedure: Secondary | ICD-10-CM | POA: Diagnosis not present

## 2016-08-14 DIAGNOSIS — J90 Pleural effusion, not elsewhere classified: Secondary | ICD-10-CM | POA: Diagnosis not present

## 2016-08-14 DIAGNOSIS — Z944 Liver transplant status: Secondary | ICD-10-CM | POA: Diagnosis not present

## 2016-08-14 DIAGNOSIS — B182 Chronic viral hepatitis C: Secondary | ICD-10-CM | POA: Diagnosis not present

## 2016-08-14 DIAGNOSIS — Z9482 Intestine transplant status: Secondary | ICD-10-CM | POA: Diagnosis not present

## 2016-08-14 DIAGNOSIS — Z9911 Dependence on respirator [ventilator] status: Secondary | ICD-10-CM | POA: Diagnosis not present

## 2016-08-14 DIAGNOSIS — I81 Portal vein thrombosis: Secondary | ICD-10-CM | POA: Diagnosis not present

## 2016-08-14 DIAGNOSIS — Z7682 Awaiting organ transplant status: Secondary | ICD-10-CM | POA: Diagnosis not present

## 2016-08-14 DIAGNOSIS — E46 Unspecified protein-calorie malnutrition: Secondary | ICD-10-CM | POA: Diagnosis not present

## 2016-08-14 HISTORY — PX: OTHER SURGICAL HISTORY: SHX169

## 2016-08-15 ENCOUNTER — Telehealth (INDEPENDENT_AMBULATORY_CARE_PROVIDER_SITE_OTHER): Payer: Self-pay | Admitting: *Deleted

## 2016-08-15 DIAGNOSIS — J90 Pleural effusion, not elsewhere classified: Secondary | ICD-10-CM | POA: Diagnosis not present

## 2016-08-15 DIAGNOSIS — Z7682 Awaiting organ transplant status: Secondary | ICD-10-CM | POA: Diagnosis not present

## 2016-08-15 DIAGNOSIS — J939 Pneumothorax, unspecified: Secondary | ICD-10-CM | POA: Diagnosis not present

## 2016-08-15 DIAGNOSIS — D899 Disorder involving the immune mechanism, unspecified: Secondary | ICD-10-CM | POA: Diagnosis not present

## 2016-08-15 DIAGNOSIS — Z944 Liver transplant status: Secondary | ICD-10-CM | POA: Diagnosis not present

## 2016-08-15 NOTE — Telephone Encounter (Signed)
[name] with [facility] called, she wanted to get a message to Dr. Laural Golden.  She wanted to let Dr. 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  If there are any questions, please call.  [phone]

## 2016-08-15 NOTE — Telephone Encounter (Signed)
I have made Dr.Rehman aware.

## 2016-08-16 DIAGNOSIS — K91870 Postprocedural hematoma of a digestive system organ or structure following a digestive system procedure: Secondary | ICD-10-CM | POA: Diagnosis not present

## 2016-08-16 DIAGNOSIS — Z944 Liver transplant status: Secondary | ICD-10-CM | POA: Diagnosis not present

## 2016-08-16 DIAGNOSIS — B182 Chronic viral hepatitis C: Secondary | ICD-10-CM | POA: Diagnosis not present

## 2016-08-16 DIAGNOSIS — Z9489 Other transplanted organ and tissue status: Secondary | ICD-10-CM | POA: Diagnosis not present

## 2016-08-16 DIAGNOSIS — E46 Unspecified protein-calorie malnutrition: Secondary | ICD-10-CM | POA: Diagnosis not present

## 2016-08-16 DIAGNOSIS — D899 Disorder involving the immune mechanism, unspecified: Secondary | ICD-10-CM | POA: Diagnosis not present

## 2016-08-16 DIAGNOSIS — Z7682 Awaiting organ transplant status: Secondary | ICD-10-CM | POA: Diagnosis not present

## 2016-08-16 DIAGNOSIS — K7460 Unspecified cirrhosis of liver: Secondary | ICD-10-CM | POA: Diagnosis not present

## 2016-08-17 DIAGNOSIS — D899 Disorder involving the immune mechanism, unspecified: Secondary | ICD-10-CM | POA: Diagnosis not present

## 2016-08-17 DIAGNOSIS — Z7682 Awaiting organ transplant status: Secondary | ICD-10-CM | POA: Diagnosis not present

## 2016-08-17 DIAGNOSIS — Z944 Liver transplant status: Secondary | ICD-10-CM | POA: Diagnosis not present

## 2016-08-18 DIAGNOSIS — Z7682 Awaiting organ transplant status: Secondary | ICD-10-CM | POA: Diagnosis not present

## 2016-08-18 DIAGNOSIS — R739 Hyperglycemia, unspecified: Secondary | ICD-10-CM | POA: Diagnosis not present

## 2016-08-18 DIAGNOSIS — E1165 Type 2 diabetes mellitus with hyperglycemia: Secondary | ICD-10-CM | POA: Diagnosis not present

## 2016-08-18 DIAGNOSIS — Z944 Liver transplant status: Secondary | ICD-10-CM | POA: Diagnosis not present

## 2016-08-18 DIAGNOSIS — T380X5A Adverse effect of glucocorticoids and synthetic analogues, initial encounter: Secondary | ICD-10-CM | POA: Diagnosis not present

## 2016-08-18 DIAGNOSIS — Z789 Other specified health status: Secondary | ICD-10-CM | POA: Diagnosis not present

## 2016-08-18 DIAGNOSIS — D899 Disorder involving the immune mechanism, unspecified: Secondary | ICD-10-CM | POA: Diagnosis not present

## 2016-08-18 DIAGNOSIS — R109 Unspecified abdominal pain: Secondary | ICD-10-CM | POA: Diagnosis not present

## 2016-08-19 DIAGNOSIS — E1165 Type 2 diabetes mellitus with hyperglycemia: Secondary | ICD-10-CM | POA: Diagnosis not present

## 2016-08-19 DIAGNOSIS — Z944 Liver transplant status: Secondary | ICD-10-CM | POA: Diagnosis not present

## 2016-08-19 DIAGNOSIS — R739 Hyperglycemia, unspecified: Secondary | ICD-10-CM | POA: Diagnosis not present

## 2016-08-19 DIAGNOSIS — T380X5A Adverse effect of glucocorticoids and synthetic analogues, initial encounter: Secondary | ICD-10-CM | POA: Diagnosis not present

## 2016-08-19 DIAGNOSIS — D899 Disorder involving the immune mechanism, unspecified: Secondary | ICD-10-CM | POA: Diagnosis not present

## 2016-08-19 DIAGNOSIS — Z7682 Awaiting organ transplant status: Secondary | ICD-10-CM | POA: Diagnosis not present

## 2016-08-19 DIAGNOSIS — R109 Unspecified abdominal pain: Secondary | ICD-10-CM | POA: Diagnosis not present

## 2016-08-19 DIAGNOSIS — Z789 Other specified health status: Secondary | ICD-10-CM | POA: Diagnosis not present

## 2016-08-20 DIAGNOSIS — D899 Disorder involving the immune mechanism, unspecified: Secondary | ICD-10-CM | POA: Diagnosis not present

## 2016-08-20 DIAGNOSIS — R109 Unspecified abdominal pain: Secondary | ICD-10-CM | POA: Diagnosis not present

## 2016-08-20 DIAGNOSIS — Z7682 Awaiting organ transplant status: Secondary | ICD-10-CM | POA: Diagnosis not present

## 2016-08-20 DIAGNOSIS — Z944 Liver transplant status: Secondary | ICD-10-CM | POA: Diagnosis not present

## 2016-08-20 DIAGNOSIS — K529 Noninfective gastroenteritis and colitis, unspecified: Secondary | ICD-10-CM | POA: Diagnosis not present

## 2016-08-21 DIAGNOSIS — D899 Disorder involving the immune mechanism, unspecified: Secondary | ICD-10-CM | POA: Diagnosis not present

## 2016-08-21 DIAGNOSIS — Z7682 Awaiting organ transplant status: Secondary | ICD-10-CM | POA: Diagnosis not present

## 2016-08-21 DIAGNOSIS — Z9489 Other transplanted organ and tissue status: Secondary | ICD-10-CM | POA: Diagnosis not present

## 2016-08-21 DIAGNOSIS — E43 Unspecified severe protein-calorie malnutrition: Secondary | ICD-10-CM | POA: Diagnosis not present

## 2016-08-21 DIAGNOSIS — Z9482 Intestine transplant status: Secondary | ICD-10-CM | POA: Diagnosis not present

## 2016-08-21 DIAGNOSIS — Z944 Liver transplant status: Secondary | ICD-10-CM | POA: Diagnosis not present

## 2016-08-21 DIAGNOSIS — R188 Other ascites: Secondary | ICD-10-CM | POA: Diagnosis not present

## 2016-08-22 DIAGNOSIS — Z789 Other specified health status: Secondary | ICD-10-CM | POA: Diagnosis not present

## 2016-08-22 DIAGNOSIS — E1165 Type 2 diabetes mellitus with hyperglycemia: Secondary | ICD-10-CM | POA: Diagnosis not present

## 2016-08-22 DIAGNOSIS — R188 Other ascites: Secondary | ICD-10-CM | POA: Diagnosis not present

## 2016-08-22 DIAGNOSIS — Z944 Liver transplant status: Secondary | ICD-10-CM | POA: Diagnosis not present

## 2016-08-22 DIAGNOSIS — E119 Type 2 diabetes mellitus without complications: Secondary | ICD-10-CM | POA: Diagnosis not present

## 2016-08-22 DIAGNOSIS — E43 Unspecified severe protein-calorie malnutrition: Secondary | ICD-10-CM | POA: Diagnosis not present

## 2016-08-22 DIAGNOSIS — Z9482 Intestine transplant status: Secondary | ICD-10-CM | POA: Diagnosis not present

## 2016-08-22 DIAGNOSIS — T380X5A Adverse effect of glucocorticoids and synthetic analogues, initial encounter: Secondary | ICD-10-CM | POA: Diagnosis not present

## 2016-08-22 DIAGNOSIS — R739 Hyperglycemia, unspecified: Secondary | ICD-10-CM | POA: Diagnosis not present

## 2016-08-22 DIAGNOSIS — T86850 Intestine transplant rejection: Secondary | ICD-10-CM | POA: Diagnosis not present

## 2016-08-22 DIAGNOSIS — D899 Disorder involving the immune mechanism, unspecified: Secondary | ICD-10-CM | POA: Diagnosis not present

## 2016-08-22 DIAGNOSIS — Z09 Encounter for follow-up examination after completed treatment for conditions other than malignant neoplasm: Secondary | ICD-10-CM | POA: Diagnosis not present

## 2016-08-22 DIAGNOSIS — D72829 Elevated white blood cell count, unspecified: Secondary | ICD-10-CM | POA: Diagnosis not present

## 2016-08-23 DIAGNOSIS — Z9482 Intestine transplant status: Secondary | ICD-10-CM | POA: Diagnosis not present

## 2016-08-23 DIAGNOSIS — E43 Unspecified severe protein-calorie malnutrition: Secondary | ICD-10-CM | POA: Diagnosis not present

## 2016-08-23 DIAGNOSIS — R188 Other ascites: Secondary | ICD-10-CM | POA: Diagnosis not present

## 2016-08-23 DIAGNOSIS — Z944 Liver transplant status: Secondary | ICD-10-CM | POA: Diagnosis not present

## 2016-08-23 DIAGNOSIS — Z9489 Other transplanted organ and tissue status: Secondary | ICD-10-CM | POA: Diagnosis not present

## 2016-08-24 DIAGNOSIS — Z7682 Awaiting organ transplant status: Secondary | ICD-10-CM | POA: Diagnosis not present

## 2016-08-24 DIAGNOSIS — R739 Hyperglycemia, unspecified: Secondary | ICD-10-CM | POA: Diagnosis not present

## 2016-08-24 DIAGNOSIS — D72829 Elevated white blood cell count, unspecified: Secondary | ICD-10-CM | POA: Diagnosis not present

## 2016-08-24 DIAGNOSIS — T380X5A Adverse effect of glucocorticoids and synthetic analogues, initial encounter: Secondary | ICD-10-CM | POA: Diagnosis not present

## 2016-08-24 DIAGNOSIS — Z789 Other specified health status: Secondary | ICD-10-CM | POA: Diagnosis not present

## 2016-08-24 DIAGNOSIS — R188 Other ascites: Secondary | ICD-10-CM | POA: Diagnosis not present

## 2016-08-24 DIAGNOSIS — Z944 Liver transplant status: Secondary | ICD-10-CM | POA: Diagnosis not present

## 2016-08-24 DIAGNOSIS — R109 Unspecified abdominal pain: Secondary | ICD-10-CM | POA: Diagnosis not present

## 2016-08-24 DIAGNOSIS — Z9482 Intestine transplant status: Secondary | ICD-10-CM | POA: Diagnosis not present

## 2016-08-24 DIAGNOSIS — D899 Disorder involving the immune mechanism, unspecified: Secondary | ICD-10-CM | POA: Diagnosis not present

## 2016-08-24 DIAGNOSIS — E1165 Type 2 diabetes mellitus with hyperglycemia: Secondary | ICD-10-CM | POA: Diagnosis not present

## 2016-08-25 DIAGNOSIS — Z9489 Other transplanted organ and tissue status: Secondary | ICD-10-CM | POA: Diagnosis not present

## 2016-08-25 DIAGNOSIS — Z789 Other specified health status: Secondary | ICD-10-CM | POA: Diagnosis not present

## 2016-08-25 DIAGNOSIS — E43 Unspecified severe protein-calorie malnutrition: Secondary | ICD-10-CM | POA: Diagnosis not present

## 2016-08-25 DIAGNOSIS — R739 Hyperglycemia, unspecified: Secondary | ICD-10-CM | POA: Diagnosis not present

## 2016-08-25 DIAGNOSIS — T380X5A Adverse effect of glucocorticoids and synthetic analogues, initial encounter: Secondary | ICD-10-CM | POA: Diagnosis not present

## 2016-08-25 DIAGNOSIS — Z9482 Intestine transplant status: Secondary | ICD-10-CM | POA: Diagnosis not present

## 2016-08-25 DIAGNOSIS — D899 Disorder involving the immune mechanism, unspecified: Secondary | ICD-10-CM | POA: Diagnosis not present

## 2016-08-25 DIAGNOSIS — E1165 Type 2 diabetes mellitus with hyperglycemia: Secondary | ICD-10-CM | POA: Diagnosis not present

## 2016-08-25 DIAGNOSIS — Z944 Liver transplant status: Secondary | ICD-10-CM | POA: Diagnosis not present

## 2016-08-25 DIAGNOSIS — R188 Other ascites: Secondary | ICD-10-CM | POA: Diagnosis not present

## 2016-08-26 DIAGNOSIS — T380X5A Adverse effect of glucocorticoids and synthetic analogues, initial encounter: Secondary | ICD-10-CM | POA: Diagnosis not present

## 2016-08-26 DIAGNOSIS — Z789 Other specified health status: Secondary | ICD-10-CM | POA: Diagnosis not present

## 2016-08-26 DIAGNOSIS — E1165 Type 2 diabetes mellitus with hyperglycemia: Secondary | ICD-10-CM | POA: Diagnosis not present

## 2016-08-26 DIAGNOSIS — Z9482 Intestine transplant status: Secondary | ICD-10-CM | POA: Diagnosis not present

## 2016-08-26 DIAGNOSIS — R739 Hyperglycemia, unspecified: Secondary | ICD-10-CM | POA: Diagnosis not present

## 2016-08-26 DIAGNOSIS — Z944 Liver transplant status: Secondary | ICD-10-CM | POA: Diagnosis not present

## 2016-08-26 DIAGNOSIS — R188 Other ascites: Secondary | ICD-10-CM | POA: Diagnosis not present

## 2016-08-26 DIAGNOSIS — D899 Disorder involving the immune mechanism, unspecified: Secondary | ICD-10-CM | POA: Diagnosis not present

## 2016-08-26 DIAGNOSIS — Z09 Encounter for follow-up examination after completed treatment for conditions other than malignant neoplasm: Secondary | ICD-10-CM | POA: Diagnosis not present

## 2016-08-27 DIAGNOSIS — Z789 Other specified health status: Secondary | ICD-10-CM | POA: Diagnosis not present

## 2016-08-27 DIAGNOSIS — Z9482 Intestine transplant status: Secondary | ICD-10-CM | POA: Diagnosis not present

## 2016-08-27 DIAGNOSIS — T380X5A Adverse effect of glucocorticoids and synthetic analogues, initial encounter: Secondary | ICD-10-CM | POA: Diagnosis not present

## 2016-08-27 DIAGNOSIS — Z944 Liver transplant status: Secondary | ICD-10-CM | POA: Diagnosis not present

## 2016-08-27 DIAGNOSIS — E876 Hypokalemia: Secondary | ICD-10-CM | POA: Diagnosis not present

## 2016-08-27 DIAGNOSIS — D72829 Elevated white blood cell count, unspecified: Secondary | ICD-10-CM | POA: Diagnosis not present

## 2016-08-27 DIAGNOSIS — D899 Disorder involving the immune mechanism, unspecified: Secondary | ICD-10-CM | POA: Diagnosis not present

## 2016-08-27 DIAGNOSIS — E1165 Type 2 diabetes mellitus with hyperglycemia: Secondary | ICD-10-CM | POA: Diagnosis not present

## 2016-08-27 DIAGNOSIS — R739 Hyperglycemia, unspecified: Secondary | ICD-10-CM | POA: Diagnosis not present

## 2016-08-28 DIAGNOSIS — D72829 Elevated white blood cell count, unspecified: Secondary | ICD-10-CM | POA: Diagnosis not present

## 2016-08-28 DIAGNOSIS — T380X5A Adverse effect of glucocorticoids and synthetic analogues, initial encounter: Secondary | ICD-10-CM | POA: Diagnosis not present

## 2016-08-28 DIAGNOSIS — E1165 Type 2 diabetes mellitus with hyperglycemia: Secondary | ICD-10-CM | POA: Diagnosis not present

## 2016-08-28 DIAGNOSIS — Z944 Liver transplant status: Secondary | ICD-10-CM | POA: Diagnosis not present

## 2016-08-28 DIAGNOSIS — Z789 Other specified health status: Secondary | ICD-10-CM | POA: Diagnosis not present

## 2016-08-28 DIAGNOSIS — E876 Hypokalemia: Secondary | ICD-10-CM | POA: Diagnosis not present

## 2016-08-28 DIAGNOSIS — Z9482 Intestine transplant status: Secondary | ICD-10-CM | POA: Diagnosis not present

## 2016-08-28 DIAGNOSIS — D899 Disorder involving the immune mechanism, unspecified: Secondary | ICD-10-CM | POA: Diagnosis not present

## 2016-08-28 DIAGNOSIS — R739 Hyperglycemia, unspecified: Secondary | ICD-10-CM | POA: Diagnosis not present

## 2016-08-29 DIAGNOSIS — R739 Hyperglycemia, unspecified: Secondary | ICD-10-CM | POA: Diagnosis not present

## 2016-08-29 DIAGNOSIS — E876 Hypokalemia: Secondary | ICD-10-CM | POA: Diagnosis not present

## 2016-08-29 DIAGNOSIS — T380X5A Adverse effect of glucocorticoids and synthetic analogues, initial encounter: Secondary | ICD-10-CM | POA: Diagnosis not present

## 2016-08-29 DIAGNOSIS — E878 Other disorders of electrolyte and fluid balance, not elsewhere classified: Secondary | ICD-10-CM | POA: Diagnosis not present

## 2016-08-29 DIAGNOSIS — D899 Disorder involving the immune mechanism, unspecified: Secondary | ICD-10-CM | POA: Diagnosis not present

## 2016-08-29 DIAGNOSIS — N9489 Other specified conditions associated with female genital organs and menstrual cycle: Secondary | ICD-10-CM | POA: Diagnosis not present

## 2016-08-29 DIAGNOSIS — E1165 Type 2 diabetes mellitus with hyperglycemia: Secondary | ICD-10-CM | POA: Diagnosis not present

## 2016-08-29 DIAGNOSIS — D72829 Elevated white blood cell count, unspecified: Secondary | ICD-10-CM | POA: Diagnosis not present

## 2016-08-29 DIAGNOSIS — T86850 Intestine transplant rejection: Secondary | ICD-10-CM | POA: Diagnosis not present

## 2016-08-29 DIAGNOSIS — Z944 Liver transplant status: Secondary | ICD-10-CM | POA: Diagnosis not present

## 2016-08-29 DIAGNOSIS — Z7682 Awaiting organ transplant status: Secondary | ICD-10-CM | POA: Diagnosis not present

## 2016-08-29 DIAGNOSIS — Z789 Other specified health status: Secondary | ICD-10-CM | POA: Diagnosis not present

## 2016-08-29 DIAGNOSIS — E43 Unspecified severe protein-calorie malnutrition: Secondary | ICD-10-CM | POA: Diagnosis not present

## 2016-08-30 DIAGNOSIS — E876 Hypokalemia: Secondary | ICD-10-CM | POA: Diagnosis not present

## 2016-08-30 DIAGNOSIS — D72829 Elevated white blood cell count, unspecified: Secondary | ICD-10-CM | POA: Diagnosis not present

## 2016-08-30 DIAGNOSIS — Z9884 Bariatric surgery status: Secondary | ICD-10-CM | POA: Diagnosis not present

## 2016-08-30 DIAGNOSIS — D899 Disorder involving the immune mechanism, unspecified: Secondary | ICD-10-CM | POA: Diagnosis not present

## 2016-08-30 DIAGNOSIS — N9489 Other specified conditions associated with female genital organs and menstrual cycle: Secondary | ICD-10-CM | POA: Diagnosis not present

## 2016-08-30 DIAGNOSIS — Z944 Liver transplant status: Secondary | ICD-10-CM | POA: Diagnosis not present

## 2016-08-30 DIAGNOSIS — Z7682 Awaiting organ transplant status: Secondary | ICD-10-CM | POA: Diagnosis not present

## 2016-08-31 DIAGNOSIS — E1165 Type 2 diabetes mellitus with hyperglycemia: Secondary | ICD-10-CM | POA: Diagnosis not present

## 2016-08-31 DIAGNOSIS — T380X5A Adverse effect of glucocorticoids and synthetic analogues, initial encounter: Secondary | ICD-10-CM | POA: Diagnosis not present

## 2016-08-31 DIAGNOSIS — N9489 Other specified conditions associated with female genital organs and menstrual cycle: Secondary | ICD-10-CM | POA: Diagnosis not present

## 2016-08-31 DIAGNOSIS — Z789 Other specified health status: Secondary | ICD-10-CM | POA: Diagnosis not present

## 2016-08-31 DIAGNOSIS — D72829 Elevated white blood cell count, unspecified: Secondary | ICD-10-CM | POA: Diagnosis not present

## 2016-08-31 DIAGNOSIS — Z7682 Awaiting organ transplant status: Secondary | ICD-10-CM | POA: Diagnosis not present

## 2016-08-31 DIAGNOSIS — D899 Disorder involving the immune mechanism, unspecified: Secondary | ICD-10-CM | POA: Diagnosis not present

## 2016-08-31 DIAGNOSIS — R739 Hyperglycemia, unspecified: Secondary | ICD-10-CM | POA: Diagnosis not present

## 2016-08-31 DIAGNOSIS — Z9884 Bariatric surgery status: Secondary | ICD-10-CM | POA: Diagnosis not present

## 2016-08-31 DIAGNOSIS — E876 Hypokalemia: Secondary | ICD-10-CM | POA: Diagnosis not present

## 2016-08-31 DIAGNOSIS — Z944 Liver transplant status: Secondary | ICD-10-CM | POA: Diagnosis not present

## 2016-09-01 ENCOUNTER — Ambulatory Visit (INDEPENDENT_AMBULATORY_CARE_PROVIDER_SITE_OTHER): Payer: Medicare Other | Admitting: Internal Medicine

## 2016-09-01 DIAGNOSIS — Z7682 Awaiting organ transplant status: Secondary | ICD-10-CM | POA: Diagnosis not present

## 2016-09-01 DIAGNOSIS — Z944 Liver transplant status: Secondary | ICD-10-CM | POA: Diagnosis not present

## 2016-09-01 DIAGNOSIS — E876 Hypokalemia: Secondary | ICD-10-CM | POA: Diagnosis not present

## 2016-09-01 DIAGNOSIS — D72829 Elevated white blood cell count, unspecified: Secondary | ICD-10-CM | POA: Diagnosis not present

## 2016-09-01 DIAGNOSIS — Z9482 Intestine transplant status: Secondary | ICD-10-CM | POA: Diagnosis not present

## 2016-09-01 DIAGNOSIS — D899 Disorder involving the immune mechanism, unspecified: Secondary | ICD-10-CM | POA: Diagnosis not present

## 2016-09-01 DIAGNOSIS — T86850 Intestine transplant rejection: Secondary | ICD-10-CM | POA: Diagnosis not present

## 2016-09-01 DIAGNOSIS — E1165 Type 2 diabetes mellitus with hyperglycemia: Secondary | ICD-10-CM | POA: Diagnosis not present

## 2016-09-01 DIAGNOSIS — Z789 Other specified health status: Secondary | ICD-10-CM | POA: Diagnosis not present

## 2016-09-01 DIAGNOSIS — T380X5A Adverse effect of glucocorticoids and synthetic analogues, initial encounter: Secondary | ICD-10-CM | POA: Diagnosis not present

## 2016-09-01 DIAGNOSIS — R739 Hyperglycemia, unspecified: Secondary | ICD-10-CM | POA: Diagnosis not present

## 2016-09-02 DIAGNOSIS — D899 Disorder involving the immune mechanism, unspecified: Secondary | ICD-10-CM | POA: Diagnosis not present

## 2016-09-02 DIAGNOSIS — Z7682 Awaiting organ transplant status: Secondary | ICD-10-CM | POA: Diagnosis not present

## 2016-09-02 DIAGNOSIS — M7989 Other specified soft tissue disorders: Secondary | ICD-10-CM | POA: Diagnosis not present

## 2016-09-02 DIAGNOSIS — N9489 Other specified conditions associated with female genital organs and menstrual cycle: Secondary | ICD-10-CM | POA: Diagnosis not present

## 2016-09-02 DIAGNOSIS — Z9884 Bariatric surgery status: Secondary | ICD-10-CM | POA: Diagnosis not present

## 2016-09-02 DIAGNOSIS — T86850 Intestine transplant rejection: Secondary | ICD-10-CM | POA: Diagnosis not present

## 2016-09-02 DIAGNOSIS — R51 Headache: Secondary | ICD-10-CM | POA: Diagnosis not present

## 2016-09-02 DIAGNOSIS — R188 Other ascites: Secondary | ICD-10-CM | POA: Diagnosis not present

## 2016-09-03 DIAGNOSIS — N9489 Other specified conditions associated with female genital organs and menstrual cycle: Secondary | ICD-10-CM | POA: Diagnosis not present

## 2016-09-03 DIAGNOSIS — D899 Disorder involving the immune mechanism, unspecified: Secondary | ICD-10-CM | POA: Diagnosis not present

## 2016-09-03 DIAGNOSIS — K633 Ulcer of intestine: Secondary | ICD-10-CM | POA: Diagnosis not present

## 2016-09-03 DIAGNOSIS — R188 Other ascites: Secondary | ICD-10-CM | POA: Diagnosis not present

## 2016-09-03 DIAGNOSIS — R51 Headache: Secondary | ICD-10-CM | POA: Diagnosis not present

## 2016-09-03 DIAGNOSIS — T86850 Intestine transplant rejection: Secondary | ICD-10-CM | POA: Diagnosis not present

## 2016-09-03 DIAGNOSIS — J90 Pleural effusion, not elsewhere classified: Secondary | ICD-10-CM | POA: Diagnosis not present

## 2016-09-03 DIAGNOSIS — Z09 Encounter for follow-up examination after completed treatment for conditions other than malignant neoplasm: Secondary | ICD-10-CM | POA: Diagnosis not present

## 2016-09-03 DIAGNOSIS — J9811 Atelectasis: Secondary | ICD-10-CM | POA: Diagnosis not present

## 2016-09-03 DIAGNOSIS — Z9884 Bariatric surgery status: Secondary | ICD-10-CM | POA: Diagnosis not present

## 2016-09-03 DIAGNOSIS — Z7682 Awaiting organ transplant status: Secondary | ICD-10-CM | POA: Diagnosis not present

## 2016-09-03 DIAGNOSIS — Z980 Intestinal bypass and anastomosis status: Secondary | ICD-10-CM | POA: Diagnosis not present

## 2016-09-03 DIAGNOSIS — Z9049 Acquired absence of other specified parts of digestive tract: Secondary | ICD-10-CM | POA: Diagnosis not present

## 2016-09-03 DIAGNOSIS — K529 Noninfective gastroenteritis and colitis, unspecified: Secondary | ICD-10-CM | POA: Diagnosis not present

## 2016-09-04 DIAGNOSIS — T86850 Intestine transplant rejection: Secondary | ICD-10-CM | POA: Diagnosis not present

## 2016-09-04 DIAGNOSIS — Z7682 Awaiting organ transplant status: Secondary | ICD-10-CM | POA: Diagnosis not present

## 2016-09-04 DIAGNOSIS — R188 Other ascites: Secondary | ICD-10-CM | POA: Diagnosis not present

## 2016-09-04 DIAGNOSIS — E1165 Type 2 diabetes mellitus with hyperglycemia: Secondary | ICD-10-CM | POA: Diagnosis not present

## 2016-09-04 DIAGNOSIS — T380X5A Adverse effect of glucocorticoids and synthetic analogues, initial encounter: Secondary | ICD-10-CM | POA: Diagnosis not present

## 2016-09-04 DIAGNOSIS — D899 Disorder involving the immune mechanism, unspecified: Secondary | ICD-10-CM | POA: Diagnosis not present

## 2016-09-04 DIAGNOSIS — Z944 Liver transplant status: Secondary | ICD-10-CM | POA: Diagnosis not present

## 2016-09-04 DIAGNOSIS — Z789 Other specified health status: Secondary | ICD-10-CM | POA: Diagnosis not present

## 2016-09-04 DIAGNOSIS — R739 Hyperglycemia, unspecified: Secondary | ICD-10-CM | POA: Diagnosis not present

## 2016-09-05 DIAGNOSIS — D72829 Elevated white blood cell count, unspecified: Secondary | ICD-10-CM | POA: Diagnosis not present

## 2016-09-05 DIAGNOSIS — Z944 Liver transplant status: Secondary | ICD-10-CM | POA: Diagnosis not present

## 2016-09-05 DIAGNOSIS — T887XXA Unspecified adverse effect of drug or medicament, initial encounter: Secondary | ICD-10-CM | POA: Diagnosis not present

## 2016-09-05 DIAGNOSIS — R51 Headache: Secondary | ICD-10-CM | POA: Diagnosis not present

## 2016-09-05 DIAGNOSIS — E876 Hypokalemia: Secondary | ICD-10-CM | POA: Diagnosis not present

## 2016-09-05 DIAGNOSIS — H538 Other visual disturbances: Secondary | ICD-10-CM | POA: Diagnosis not present

## 2016-09-05 DIAGNOSIS — D899 Disorder involving the immune mechanism, unspecified: Secondary | ICD-10-CM | POA: Diagnosis not present

## 2016-09-05 DIAGNOSIS — Z7682 Awaiting organ transplant status: Secondary | ICD-10-CM | POA: Diagnosis not present

## 2016-09-05 DIAGNOSIS — T86850 Intestine transplant rejection: Secondary | ICD-10-CM | POA: Diagnosis not present

## 2016-09-06 DIAGNOSIS — T86850 Intestine transplant rejection: Secondary | ICD-10-CM | POA: Diagnosis not present

## 2016-09-06 DIAGNOSIS — I517 Cardiomegaly: Secondary | ICD-10-CM | POA: Diagnosis not present

## 2016-09-06 DIAGNOSIS — D72829 Elevated white blood cell count, unspecified: Secondary | ICD-10-CM | POA: Diagnosis not present

## 2016-09-06 DIAGNOSIS — Z944 Liver transplant status: Secondary | ICD-10-CM | POA: Diagnosis not present

## 2016-09-06 DIAGNOSIS — R188 Other ascites: Secondary | ICD-10-CM | POA: Diagnosis not present

## 2016-09-06 DIAGNOSIS — J90 Pleural effusion, not elsewhere classified: Secondary | ICD-10-CM | POA: Diagnosis not present

## 2016-09-06 DIAGNOSIS — R918 Other nonspecific abnormal finding of lung field: Secondary | ICD-10-CM | POA: Diagnosis not present

## 2016-09-06 DIAGNOSIS — Z789 Other specified health status: Secondary | ICD-10-CM | POA: Diagnosis not present

## 2016-09-06 DIAGNOSIS — E60 Dietary zinc deficiency: Secondary | ICD-10-CM | POA: Diagnosis not present

## 2016-09-06 DIAGNOSIS — E119 Type 2 diabetes mellitus without complications: Secondary | ICD-10-CM | POA: Diagnosis not present

## 2016-09-06 DIAGNOSIS — Z9884 Bariatric surgery status: Secondary | ICD-10-CM | POA: Diagnosis not present

## 2016-09-06 DIAGNOSIS — D899 Disorder involving the immune mechanism, unspecified: Secondary | ICD-10-CM | POA: Diagnosis not present

## 2016-09-06 DIAGNOSIS — Z7682 Awaiting organ transplant status: Secondary | ICD-10-CM | POA: Diagnosis not present

## 2016-09-06 DIAGNOSIS — E43 Unspecified severe protein-calorie malnutrition: Secondary | ICD-10-CM | POA: Diagnosis not present

## 2016-09-06 DIAGNOSIS — R51 Headache: Secondary | ICD-10-CM | POA: Diagnosis not present

## 2016-09-06 DIAGNOSIS — E610 Copper deficiency: Secondary | ICD-10-CM | POA: Diagnosis not present

## 2016-09-07 DIAGNOSIS — J9811 Atelectasis: Secondary | ICD-10-CM | POA: Diagnosis not present

## 2016-09-07 DIAGNOSIS — D899 Disorder involving the immune mechanism, unspecified: Secondary | ICD-10-CM | POA: Diagnosis not present

## 2016-09-07 DIAGNOSIS — J90 Pleural effusion, not elsewhere classified: Secondary | ICD-10-CM | POA: Diagnosis not present

## 2016-09-07 DIAGNOSIS — R51 Headache: Secondary | ICD-10-CM | POA: Diagnosis not present

## 2016-09-07 DIAGNOSIS — Z789 Other specified health status: Secondary | ICD-10-CM | POA: Diagnosis not present

## 2016-09-07 DIAGNOSIS — T86850 Intestine transplant rejection: Secondary | ICD-10-CM | POA: Diagnosis not present

## 2016-09-07 DIAGNOSIS — E1165 Type 2 diabetes mellitus with hyperglycemia: Secondary | ICD-10-CM | POA: Diagnosis not present

## 2016-09-07 DIAGNOSIS — D72829 Elevated white blood cell count, unspecified: Secondary | ICD-10-CM | POA: Diagnosis not present

## 2016-09-07 DIAGNOSIS — Z9884 Bariatric surgery status: Secondary | ICD-10-CM | POA: Diagnosis not present

## 2016-09-07 DIAGNOSIS — R918 Other nonspecific abnormal finding of lung field: Secondary | ICD-10-CM | POA: Diagnosis not present

## 2016-09-07 DIAGNOSIS — Z944 Liver transplant status: Secondary | ICD-10-CM | POA: Diagnosis not present

## 2016-09-07 DIAGNOSIS — T380X5A Adverse effect of glucocorticoids and synthetic analogues, initial encounter: Secondary | ICD-10-CM | POA: Diagnosis not present

## 2016-09-07 DIAGNOSIS — E119 Type 2 diabetes mellitus without complications: Secondary | ICD-10-CM | POA: Diagnosis not present

## 2016-09-07 DIAGNOSIS — R739 Hyperglycemia, unspecified: Secondary | ICD-10-CM | POA: Diagnosis not present

## 2016-09-07 DIAGNOSIS — R188 Other ascites: Secondary | ICD-10-CM | POA: Diagnosis not present

## 2016-09-07 DIAGNOSIS — Z7682 Awaiting organ transplant status: Secondary | ICD-10-CM | POA: Diagnosis not present

## 2016-09-08 DIAGNOSIS — R918 Other nonspecific abnormal finding of lung field: Secondary | ICD-10-CM | POA: Diagnosis not present

## 2016-09-08 DIAGNOSIS — Z9482 Intestine transplant status: Secondary | ICD-10-CM | POA: Diagnosis not present

## 2016-09-08 DIAGNOSIS — Z09 Encounter for follow-up examination after completed treatment for conditions other than malignant neoplasm: Secondary | ICD-10-CM | POA: Diagnosis not present

## 2016-09-08 DIAGNOSIS — T86850 Intestine transplant rejection: Secondary | ICD-10-CM | POA: Diagnosis not present

## 2016-09-08 DIAGNOSIS — J90 Pleural effusion, not elsewhere classified: Secondary | ICD-10-CM | POA: Diagnosis not present

## 2016-09-08 DIAGNOSIS — Z7682 Awaiting organ transplant status: Secondary | ICD-10-CM | POA: Diagnosis not present

## 2016-09-08 DIAGNOSIS — R188 Other ascites: Secondary | ICD-10-CM | POA: Diagnosis not present

## 2016-09-08 DIAGNOSIS — Z944 Liver transplant status: Secondary | ICD-10-CM | POA: Diagnosis not present

## 2016-09-08 DIAGNOSIS — R51 Headache: Secondary | ICD-10-CM | POA: Diagnosis not present

## 2016-09-08 DIAGNOSIS — A4189 Other specified sepsis: Secondary | ICD-10-CM | POA: Diagnosis not present

## 2016-09-08 DIAGNOSIS — Z9884 Bariatric surgery status: Secondary | ICD-10-CM | POA: Diagnosis not present

## 2016-09-08 DIAGNOSIS — J811 Chronic pulmonary edema: Secondary | ICD-10-CM | POA: Diagnosis not present

## 2016-09-08 DIAGNOSIS — Z980 Intestinal bypass and anastomosis status: Secondary | ICD-10-CM | POA: Diagnosis not present

## 2016-09-08 DIAGNOSIS — N179 Acute kidney failure, unspecified: Secondary | ICD-10-CM | POA: Diagnosis not present

## 2016-09-08 DIAGNOSIS — D72829 Elevated white blood cell count, unspecified: Secondary | ICD-10-CM | POA: Diagnosis not present

## 2016-09-08 DIAGNOSIS — D899 Disorder involving the immune mechanism, unspecified: Secondary | ICD-10-CM | POA: Diagnosis not present

## 2016-09-09 DIAGNOSIS — R188 Other ascites: Secondary | ICD-10-CM | POA: Diagnosis not present

## 2016-09-09 DIAGNOSIS — N179 Acute kidney failure, unspecified: Secondary | ICD-10-CM | POA: Diagnosis not present

## 2016-09-09 DIAGNOSIS — Z9482 Intestine transplant status: Secondary | ICD-10-CM | POA: Diagnosis not present

## 2016-09-09 DIAGNOSIS — D72829 Elevated white blood cell count, unspecified: Secondary | ICD-10-CM | POA: Diagnosis not present

## 2016-09-09 DIAGNOSIS — R918 Other nonspecific abnormal finding of lung field: Secondary | ICD-10-CM | POA: Diagnosis not present

## 2016-09-09 DIAGNOSIS — R739 Hyperglycemia, unspecified: Secondary | ICD-10-CM | POA: Diagnosis not present

## 2016-09-09 DIAGNOSIS — Z9884 Bariatric surgery status: Secondary | ICD-10-CM | POA: Diagnosis not present

## 2016-09-09 DIAGNOSIS — Z944 Liver transplant status: Secondary | ICD-10-CM | POA: Diagnosis not present

## 2016-09-09 DIAGNOSIS — Z789 Other specified health status: Secondary | ICD-10-CM | POA: Diagnosis not present

## 2016-09-09 DIAGNOSIS — J90 Pleural effusion, not elsewhere classified: Secondary | ICD-10-CM | POA: Diagnosis not present

## 2016-09-09 DIAGNOSIS — D899 Disorder involving the immune mechanism, unspecified: Secondary | ICD-10-CM | POA: Diagnosis not present

## 2016-09-09 DIAGNOSIS — T380X5A Adverse effect of glucocorticoids and synthetic analogues, initial encounter: Secondary | ICD-10-CM | POA: Diagnosis not present

## 2016-09-09 DIAGNOSIS — E1165 Type 2 diabetes mellitus with hyperglycemia: Secondary | ICD-10-CM | POA: Diagnosis not present

## 2016-09-09 DIAGNOSIS — T86850 Intestine transplant rejection: Secondary | ICD-10-CM | POA: Diagnosis not present

## 2016-09-09 DIAGNOSIS — R51 Headache: Secondary | ICD-10-CM | POA: Diagnosis not present

## 2016-09-09 DIAGNOSIS — A4189 Other specified sepsis: Secondary | ICD-10-CM | POA: Diagnosis not present

## 2016-09-10 DIAGNOSIS — A4189 Other specified sepsis: Secondary | ICD-10-CM | POA: Diagnosis not present

## 2016-09-10 DIAGNOSIS — Z944 Liver transplant status: Secondary | ICD-10-CM | POA: Diagnosis not present

## 2016-09-10 DIAGNOSIS — B182 Chronic viral hepatitis C: Secondary | ICD-10-CM | POA: Diagnosis not present

## 2016-09-10 DIAGNOSIS — D899 Disorder involving the immune mechanism, unspecified: Secondary | ICD-10-CM | POA: Diagnosis not present

## 2016-09-10 DIAGNOSIS — Z7682 Awaiting organ transplant status: Secondary | ICD-10-CM | POA: Diagnosis not present

## 2016-09-10 DIAGNOSIS — J90 Pleural effusion, not elsewhere classified: Secondary | ICD-10-CM | POA: Diagnosis not present

## 2016-09-10 DIAGNOSIS — E119 Type 2 diabetes mellitus without complications: Secondary | ICD-10-CM | POA: Diagnosis not present

## 2016-09-10 DIAGNOSIS — Z9482 Intestine transplant status: Secondary | ICD-10-CM | POA: Diagnosis not present

## 2016-09-10 DIAGNOSIS — R51 Headache: Secondary | ICD-10-CM | POA: Diagnosis not present

## 2016-09-10 DIAGNOSIS — R918 Other nonspecific abnormal finding of lung field: Secondary | ICD-10-CM | POA: Diagnosis not present

## 2016-09-11 DIAGNOSIS — R51 Headache: Secondary | ICD-10-CM | POA: Diagnosis not present

## 2016-09-11 DIAGNOSIS — Z9482 Intestine transplant status: Secondary | ICD-10-CM | POA: Diagnosis not present

## 2016-09-11 DIAGNOSIS — T86850 Intestine transplant rejection: Secondary | ICD-10-CM | POA: Diagnosis not present

## 2016-09-11 DIAGNOSIS — Z944 Liver transplant status: Secondary | ICD-10-CM | POA: Diagnosis not present

## 2016-09-11 DIAGNOSIS — D899 Disorder involving the immune mechanism, unspecified: Secondary | ICD-10-CM | POA: Diagnosis not present

## 2016-09-12 DIAGNOSIS — B182 Chronic viral hepatitis C: Secondary | ICD-10-CM | POA: Diagnosis not present

## 2016-09-12 DIAGNOSIS — Z944 Liver transplant status: Secondary | ICD-10-CM | POA: Diagnosis not present

## 2016-09-12 DIAGNOSIS — E1165 Type 2 diabetes mellitus with hyperglycemia: Secondary | ICD-10-CM | POA: Diagnosis not present

## 2016-09-12 DIAGNOSIS — T380X5A Adverse effect of glucocorticoids and synthetic analogues, initial encounter: Secondary | ICD-10-CM | POA: Diagnosis not present

## 2016-09-12 DIAGNOSIS — R739 Hyperglycemia, unspecified: Secondary | ICD-10-CM | POA: Diagnosis not present

## 2016-09-12 DIAGNOSIS — T86850 Intestine transplant rejection: Secondary | ICD-10-CM | POA: Diagnosis not present

## 2016-09-12 DIAGNOSIS — R188 Other ascites: Secondary | ICD-10-CM | POA: Diagnosis not present

## 2016-09-12 DIAGNOSIS — R51 Headache: Secondary | ICD-10-CM | POA: Diagnosis not present

## 2016-09-12 DIAGNOSIS — Z9482 Intestine transplant status: Secondary | ICD-10-CM | POA: Diagnosis not present

## 2016-09-12 DIAGNOSIS — D899 Disorder involving the immune mechanism, unspecified: Secondary | ICD-10-CM | POA: Diagnosis not present

## 2016-09-12 DIAGNOSIS — Z7682 Awaiting organ transplant status: Secondary | ICD-10-CM | POA: Diagnosis not present

## 2016-09-12 DIAGNOSIS — R7989 Other specified abnormal findings of blood chemistry: Secondary | ICD-10-CM | POA: Diagnosis not present

## 2016-09-12 DIAGNOSIS — Z789 Other specified health status: Secondary | ICD-10-CM | POA: Diagnosis not present

## 2016-09-12 DIAGNOSIS — R509 Fever, unspecified: Secondary | ICD-10-CM | POA: Diagnosis not present

## 2016-09-13 DIAGNOSIS — Z944 Liver transplant status: Secondary | ICD-10-CM | POA: Diagnosis not present

## 2016-09-13 DIAGNOSIS — B182 Chronic viral hepatitis C: Secondary | ICD-10-CM | POA: Diagnosis not present

## 2016-09-13 DIAGNOSIS — R509 Fever, unspecified: Secondary | ICD-10-CM | POA: Diagnosis not present

## 2016-09-13 DIAGNOSIS — R51 Headache: Secondary | ICD-10-CM | POA: Diagnosis not present

## 2016-09-13 DIAGNOSIS — Z789 Other specified health status: Secondary | ICD-10-CM | POA: Diagnosis not present

## 2016-09-13 DIAGNOSIS — T380X5A Adverse effect of glucocorticoids and synthetic analogues, initial encounter: Secondary | ICD-10-CM | POA: Diagnosis not present

## 2016-09-13 DIAGNOSIS — Z9482 Intestine transplant status: Secondary | ICD-10-CM | POA: Diagnosis not present

## 2016-09-13 DIAGNOSIS — T86850 Intestine transplant rejection: Secondary | ICD-10-CM | POA: Diagnosis not present

## 2016-09-13 DIAGNOSIS — R188 Other ascites: Secondary | ICD-10-CM | POA: Diagnosis not present

## 2016-09-13 DIAGNOSIS — D899 Disorder involving the immune mechanism, unspecified: Secondary | ICD-10-CM | POA: Diagnosis not present

## 2016-09-13 DIAGNOSIS — E1165 Type 2 diabetes mellitus with hyperglycemia: Secondary | ICD-10-CM | POA: Diagnosis not present

## 2016-09-13 DIAGNOSIS — Z7682 Awaiting organ transplant status: Secondary | ICD-10-CM | POA: Diagnosis not present

## 2016-09-13 DIAGNOSIS — R739 Hyperglycemia, unspecified: Secondary | ICD-10-CM | POA: Diagnosis not present

## 2016-09-14 DIAGNOSIS — D899 Disorder involving the immune mechanism, unspecified: Secondary | ICD-10-CM | POA: Diagnosis not present

## 2016-09-14 DIAGNOSIS — B182 Chronic viral hepatitis C: Secondary | ICD-10-CM | POA: Diagnosis not present

## 2016-09-14 DIAGNOSIS — Z09 Encounter for follow-up examination after completed treatment for conditions other than malignant neoplasm: Secondary | ICD-10-CM | POA: Diagnosis not present

## 2016-09-14 DIAGNOSIS — N179 Acute kidney failure, unspecified: Secondary | ICD-10-CM | POA: Diagnosis not present

## 2016-09-14 DIAGNOSIS — Z9482 Intestine transplant status: Secondary | ICD-10-CM | POA: Diagnosis not present

## 2016-09-14 DIAGNOSIS — Z944 Liver transplant status: Secondary | ICD-10-CM | POA: Diagnosis not present

## 2016-09-14 DIAGNOSIS — R188 Other ascites: Secondary | ICD-10-CM | POA: Diagnosis not present

## 2016-09-14 DIAGNOSIS — R509 Fever, unspecified: Secondary | ICD-10-CM | POA: Diagnosis not present

## 2016-09-14 DIAGNOSIS — R51 Headache: Secondary | ICD-10-CM | POA: Diagnosis not present

## 2016-09-14 DIAGNOSIS — T86850 Intestine transplant rejection: Secondary | ICD-10-CM | POA: Diagnosis not present

## 2016-09-14 DIAGNOSIS — Z949 Transplanted organ and tissue status, unspecified: Secondary | ICD-10-CM | POA: Diagnosis not present

## 2016-09-15 DIAGNOSIS — R188 Other ascites: Secondary | ICD-10-CM | POA: Diagnosis not present

## 2016-09-15 DIAGNOSIS — D899 Disorder involving the immune mechanism, unspecified: Secondary | ICD-10-CM | POA: Diagnosis not present

## 2016-09-15 DIAGNOSIS — E1165 Type 2 diabetes mellitus with hyperglycemia: Secondary | ICD-10-CM | POA: Diagnosis not present

## 2016-09-15 DIAGNOSIS — R739 Hyperglycemia, unspecified: Secondary | ICD-10-CM | POA: Diagnosis not present

## 2016-09-15 DIAGNOSIS — Z9482 Intestine transplant status: Secondary | ICD-10-CM | POA: Diagnosis not present

## 2016-09-15 DIAGNOSIS — B182 Chronic viral hepatitis C: Secondary | ICD-10-CM | POA: Diagnosis not present

## 2016-09-15 DIAGNOSIS — T380X5A Adverse effect of glucocorticoids and synthetic analogues, initial encounter: Secondary | ICD-10-CM | POA: Diagnosis not present

## 2016-09-15 DIAGNOSIS — Z944 Liver transplant status: Secondary | ICD-10-CM | POA: Diagnosis not present

## 2016-09-15 DIAGNOSIS — T86850 Intestine transplant rejection: Secondary | ICD-10-CM | POA: Diagnosis not present

## 2016-09-15 DIAGNOSIS — Z789 Other specified health status: Secondary | ICD-10-CM | POA: Diagnosis not present

## 2016-09-15 DIAGNOSIS — R51 Headache: Secondary | ICD-10-CM | POA: Diagnosis not present

## 2016-09-15 DIAGNOSIS — R509 Fever, unspecified: Secondary | ICD-10-CM | POA: Diagnosis not present

## 2016-09-16 DIAGNOSIS — D899 Disorder involving the immune mechanism, unspecified: Secondary | ICD-10-CM | POA: Diagnosis not present

## 2016-09-16 DIAGNOSIS — R509 Fever, unspecified: Secondary | ICD-10-CM | POA: Diagnosis not present

## 2016-09-16 DIAGNOSIS — E1165 Type 2 diabetes mellitus with hyperglycemia: Secondary | ICD-10-CM | POA: Diagnosis not present

## 2016-09-16 DIAGNOSIS — R188 Other ascites: Secondary | ICD-10-CM | POA: Diagnosis not present

## 2016-09-16 DIAGNOSIS — Z794 Long term (current) use of insulin: Secondary | ICD-10-CM | POA: Diagnosis not present

## 2016-09-16 DIAGNOSIS — R51 Headache: Secondary | ICD-10-CM | POA: Diagnosis not present

## 2016-09-16 DIAGNOSIS — Z9482 Intestine transplant status: Secondary | ICD-10-CM | POA: Diagnosis not present

## 2016-09-16 DIAGNOSIS — Z789 Other specified health status: Secondary | ICD-10-CM | POA: Diagnosis not present

## 2016-09-16 DIAGNOSIS — E119 Type 2 diabetes mellitus without complications: Secondary | ICD-10-CM | POA: Diagnosis not present

## 2016-09-16 DIAGNOSIS — B182 Chronic viral hepatitis C: Secondary | ICD-10-CM | POA: Diagnosis not present

## 2016-09-16 DIAGNOSIS — T86850 Intestine transplant rejection: Secondary | ICD-10-CM | POA: Diagnosis not present

## 2016-09-16 DIAGNOSIS — Z944 Liver transplant status: Secondary | ICD-10-CM | POA: Diagnosis not present

## 2016-09-17 DIAGNOSIS — R51 Headache: Secondary | ICD-10-CM | POA: Diagnosis not present

## 2016-09-17 DIAGNOSIS — Z9482 Intestine transplant status: Secondary | ICD-10-CM | POA: Diagnosis not present

## 2016-09-17 DIAGNOSIS — B182 Chronic viral hepatitis C: Secondary | ICD-10-CM | POA: Diagnosis not present

## 2016-09-17 DIAGNOSIS — N179 Acute kidney failure, unspecified: Secondary | ICD-10-CM | POA: Diagnosis not present

## 2016-09-17 DIAGNOSIS — R188 Other ascites: Secondary | ICD-10-CM | POA: Diagnosis not present

## 2016-09-17 DIAGNOSIS — Z944 Liver transplant status: Secondary | ICD-10-CM | POA: Diagnosis not present

## 2016-09-17 DIAGNOSIS — D899 Disorder involving the immune mechanism, unspecified: Secondary | ICD-10-CM | POA: Diagnosis not present

## 2016-09-17 DIAGNOSIS — Z7682 Awaiting organ transplant status: Secondary | ICD-10-CM | POA: Diagnosis not present

## 2016-09-18 DIAGNOSIS — Z9482 Intestine transplant status: Secondary | ICD-10-CM | POA: Diagnosis not present

## 2016-09-18 DIAGNOSIS — D72829 Elevated white blood cell count, unspecified: Secondary | ICD-10-CM | POA: Diagnosis not present

## 2016-09-18 DIAGNOSIS — R188 Other ascites: Secondary | ICD-10-CM | POA: Diagnosis not present

## 2016-09-18 DIAGNOSIS — N179 Acute kidney failure, unspecified: Secondary | ICD-10-CM | POA: Diagnosis not present

## 2016-09-18 DIAGNOSIS — J90 Pleural effusion, not elsewhere classified: Secondary | ICD-10-CM | POA: Diagnosis not present

## 2016-09-18 DIAGNOSIS — R918 Other nonspecific abnormal finding of lung field: Secondary | ICD-10-CM | POA: Diagnosis not present

## 2016-09-18 DIAGNOSIS — R51 Headache: Secondary | ICD-10-CM | POA: Diagnosis not present

## 2016-09-19 DIAGNOSIS — R509 Fever, unspecified: Secondary | ICD-10-CM | POA: Diagnosis not present

## 2016-09-19 DIAGNOSIS — Z7682 Awaiting organ transplant status: Secondary | ICD-10-CM | POA: Diagnosis not present

## 2016-09-19 DIAGNOSIS — T86850 Intestine transplant rejection: Secondary | ICD-10-CM | POA: Diagnosis not present

## 2016-09-19 DIAGNOSIS — R188 Other ascites: Secondary | ICD-10-CM | POA: Diagnosis not present

## 2016-09-19 DIAGNOSIS — N179 Acute kidney failure, unspecified: Secondary | ICD-10-CM | POA: Diagnosis not present

## 2016-09-19 DIAGNOSIS — R69 Illness, unspecified: Secondary | ICD-10-CM | POA: Diagnosis not present

## 2016-09-19 DIAGNOSIS — Z9482 Intestine transplant status: Secondary | ICD-10-CM | POA: Diagnosis not present

## 2016-09-19 DIAGNOSIS — R51 Headache: Secondary | ICD-10-CM | POA: Diagnosis not present

## 2016-09-19 DIAGNOSIS — K6389 Other specified diseases of intestine: Secondary | ICD-10-CM | POA: Diagnosis not present

## 2016-09-19 DIAGNOSIS — E119 Type 2 diabetes mellitus without complications: Secondary | ICD-10-CM | POA: Diagnosis not present

## 2016-09-19 DIAGNOSIS — E8770 Fluid overload, unspecified: Secondary | ICD-10-CM | POA: Diagnosis not present

## 2016-09-19 DIAGNOSIS — Z980 Intestinal bypass and anastomosis status: Secondary | ICD-10-CM | POA: Diagnosis not present

## 2016-09-19 DIAGNOSIS — D899 Disorder involving the immune mechanism, unspecified: Secondary | ICD-10-CM | POA: Diagnosis not present

## 2016-09-20 DIAGNOSIS — R509 Fever, unspecified: Secondary | ICD-10-CM | POA: Diagnosis not present

## 2016-09-20 DIAGNOSIS — E8770 Fluid overload, unspecified: Secondary | ICD-10-CM | POA: Diagnosis not present

## 2016-09-20 DIAGNOSIS — Z7682 Awaiting organ transplant status: Secondary | ICD-10-CM | POA: Diagnosis not present

## 2016-09-20 DIAGNOSIS — D899 Disorder involving the immune mechanism, unspecified: Secondary | ICD-10-CM | POA: Diagnosis not present

## 2016-09-20 DIAGNOSIS — E119 Type 2 diabetes mellitus without complications: Secondary | ICD-10-CM | POA: Diagnosis not present

## 2016-09-20 DIAGNOSIS — R188 Other ascites: Secondary | ICD-10-CM | POA: Diagnosis not present

## 2016-09-20 DIAGNOSIS — T86850 Intestine transplant rejection: Secondary | ICD-10-CM | POA: Diagnosis not present

## 2016-09-20 DIAGNOSIS — N179 Acute kidney failure, unspecified: Secondary | ICD-10-CM | POA: Diagnosis not present

## 2016-09-20 DIAGNOSIS — R51 Headache: Secondary | ICD-10-CM | POA: Diagnosis not present

## 2016-09-21 DIAGNOSIS — R51 Headache: Secondary | ICD-10-CM | POA: Diagnosis not present

## 2016-09-21 DIAGNOSIS — E1165 Type 2 diabetes mellitus with hyperglycemia: Secondary | ICD-10-CM | POA: Diagnosis not present

## 2016-09-21 DIAGNOSIS — Z7682 Awaiting organ transplant status: Secondary | ICD-10-CM | POA: Diagnosis not present

## 2016-09-21 DIAGNOSIS — E119 Type 2 diabetes mellitus without complications: Secondary | ICD-10-CM | POA: Diagnosis not present

## 2016-09-21 DIAGNOSIS — R739 Hyperglycemia, unspecified: Secondary | ICD-10-CM | POA: Diagnosis not present

## 2016-09-21 DIAGNOSIS — R188 Other ascites: Secondary | ICD-10-CM | POA: Diagnosis not present

## 2016-09-21 DIAGNOSIS — T86850 Intestine transplant rejection: Secondary | ICD-10-CM | POA: Diagnosis not present

## 2016-09-21 DIAGNOSIS — Z794 Long term (current) use of insulin: Secondary | ICD-10-CM | POA: Diagnosis not present

## 2016-09-21 DIAGNOSIS — T380X5A Adverse effect of glucocorticoids and synthetic analogues, initial encounter: Secondary | ICD-10-CM | POA: Diagnosis not present

## 2016-09-21 DIAGNOSIS — E8770 Fluid overload, unspecified: Secondary | ICD-10-CM | POA: Diagnosis not present

## 2016-09-21 DIAGNOSIS — N179 Acute kidney failure, unspecified: Secondary | ICD-10-CM | POA: Diagnosis not present

## 2016-09-21 DIAGNOSIS — R509 Fever, unspecified: Secondary | ICD-10-CM | POA: Diagnosis not present

## 2016-09-21 DIAGNOSIS — D899 Disorder involving the immune mechanism, unspecified: Secondary | ICD-10-CM | POA: Diagnosis not present

## 2016-09-22 DIAGNOSIS — Z794 Long term (current) use of insulin: Secondary | ICD-10-CM | POA: Diagnosis not present

## 2016-09-22 DIAGNOSIS — R509 Fever, unspecified: Secondary | ICD-10-CM | POA: Diagnosis not present

## 2016-09-22 DIAGNOSIS — E8770 Fluid overload, unspecified: Secondary | ICD-10-CM | POA: Diagnosis not present

## 2016-09-22 DIAGNOSIS — E1165 Type 2 diabetes mellitus with hyperglycemia: Secondary | ICD-10-CM | POA: Diagnosis not present

## 2016-09-22 DIAGNOSIS — N179 Acute kidney failure, unspecified: Secondary | ICD-10-CM | POA: Diagnosis not present

## 2016-09-22 DIAGNOSIS — E119 Type 2 diabetes mellitus without complications: Secondary | ICD-10-CM | POA: Diagnosis not present

## 2016-09-22 DIAGNOSIS — T86850 Intestine transplant rejection: Secondary | ICD-10-CM | POA: Diagnosis not present

## 2016-09-22 DIAGNOSIS — Z7682 Awaiting organ transplant status: Secondary | ICD-10-CM | POA: Diagnosis not present

## 2016-09-22 DIAGNOSIS — R188 Other ascites: Secondary | ICD-10-CM | POA: Diagnosis not present

## 2016-09-22 DIAGNOSIS — R739 Hyperglycemia, unspecified: Secondary | ICD-10-CM | POA: Diagnosis not present

## 2016-09-22 DIAGNOSIS — R51 Headache: Secondary | ICD-10-CM | POA: Diagnosis not present

## 2016-09-22 DIAGNOSIS — T380X5A Adverse effect of glucocorticoids and synthetic analogues, initial encounter: Secondary | ICD-10-CM | POA: Diagnosis not present

## 2016-09-22 DIAGNOSIS — D899 Disorder involving the immune mechanism, unspecified: Secondary | ICD-10-CM | POA: Diagnosis not present

## 2016-09-23 DIAGNOSIS — D899 Disorder involving the immune mechanism, unspecified: Secondary | ICD-10-CM | POA: Diagnosis not present

## 2016-09-23 DIAGNOSIS — E119 Type 2 diabetes mellitus without complications: Secondary | ICD-10-CM | POA: Diagnosis not present

## 2016-09-23 DIAGNOSIS — R188 Other ascites: Secondary | ICD-10-CM | POA: Diagnosis not present

## 2016-09-23 DIAGNOSIS — N179 Acute kidney failure, unspecified: Secondary | ICD-10-CM | POA: Diagnosis not present

## 2016-09-23 DIAGNOSIS — R509 Fever, unspecified: Secondary | ICD-10-CM | POA: Diagnosis not present

## 2016-09-23 DIAGNOSIS — T86850 Intestine transplant rejection: Secondary | ICD-10-CM | POA: Diagnosis not present

## 2016-09-23 DIAGNOSIS — E8770 Fluid overload, unspecified: Secondary | ICD-10-CM | POA: Diagnosis not present

## 2016-09-23 DIAGNOSIS — R51 Headache: Secondary | ICD-10-CM | POA: Diagnosis not present

## 2016-09-23 DIAGNOSIS — Z7682 Awaiting organ transplant status: Secondary | ICD-10-CM | POA: Diagnosis not present

## 2016-09-24 DIAGNOSIS — D899 Disorder involving the immune mechanism, unspecified: Secondary | ICD-10-CM | POA: Diagnosis not present

## 2016-09-24 DIAGNOSIS — E8770 Fluid overload, unspecified: Secondary | ICD-10-CM | POA: Diagnosis not present

## 2016-09-24 DIAGNOSIS — R51 Headache: Secondary | ICD-10-CM | POA: Diagnosis not present

## 2016-09-24 DIAGNOSIS — T86850 Intestine transplant rejection: Secondary | ICD-10-CM | POA: Diagnosis not present

## 2016-09-24 DIAGNOSIS — E119 Type 2 diabetes mellitus without complications: Secondary | ICD-10-CM | POA: Diagnosis not present

## 2016-09-24 DIAGNOSIS — N179 Acute kidney failure, unspecified: Secondary | ICD-10-CM | POA: Diagnosis not present

## 2016-09-24 DIAGNOSIS — R188 Other ascites: Secondary | ICD-10-CM | POA: Diagnosis not present

## 2016-09-25 DIAGNOSIS — E8770 Fluid overload, unspecified: Secondary | ICD-10-CM | POA: Diagnosis not present

## 2016-09-25 DIAGNOSIS — E119 Type 2 diabetes mellitus without complications: Secondary | ICD-10-CM | POA: Diagnosis not present

## 2016-09-25 DIAGNOSIS — R51 Headache: Secondary | ICD-10-CM | POA: Diagnosis not present

## 2016-09-25 DIAGNOSIS — N179 Acute kidney failure, unspecified: Secondary | ICD-10-CM | POA: Diagnosis not present

## 2016-09-25 DIAGNOSIS — Z7682 Awaiting organ transplant status: Secondary | ICD-10-CM | POA: Diagnosis not present

## 2016-09-25 DIAGNOSIS — R188 Other ascites: Secondary | ICD-10-CM | POA: Diagnosis not present

## 2016-09-25 DIAGNOSIS — T86850 Intestine transplant rejection: Secondary | ICD-10-CM | POA: Diagnosis not present

## 2016-09-25 DIAGNOSIS — D899 Disorder involving the immune mechanism, unspecified: Secondary | ICD-10-CM | POA: Diagnosis not present

## 2016-09-26 DIAGNOSIS — Z9482 Intestine transplant status: Secondary | ICD-10-CM | POA: Diagnosis not present

## 2016-09-26 DIAGNOSIS — Z944 Liver transplant status: Secondary | ICD-10-CM | POA: Diagnosis not present

## 2016-09-26 DIAGNOSIS — Z298 Encounter for other specified prophylactic measures: Secondary | ICD-10-CM | POA: Diagnosis not present

## 2016-09-26 DIAGNOSIS — Z7682 Awaiting organ transplant status: Secondary | ICD-10-CM | POA: Diagnosis not present

## 2016-09-26 DIAGNOSIS — M5134 Other intervertebral disc degeneration, thoracic region: Secondary | ICD-10-CM | POA: Diagnosis not present

## 2016-09-26 DIAGNOSIS — Z09 Encounter for follow-up examination after completed treatment for conditions other than malignant neoplasm: Secondary | ICD-10-CM | POA: Diagnosis not present

## 2016-09-27 DIAGNOSIS — Z944 Liver transplant status: Secondary | ICD-10-CM | POA: Diagnosis not present

## 2016-09-27 DIAGNOSIS — D72829 Elevated white blood cell count, unspecified: Secondary | ICD-10-CM | POA: Diagnosis not present

## 2016-09-27 DIAGNOSIS — E1165 Type 2 diabetes mellitus with hyperglycemia: Secondary | ICD-10-CM | POA: Diagnosis not present

## 2016-09-27 DIAGNOSIS — Z7682 Awaiting organ transplant status: Secondary | ICD-10-CM | POA: Diagnosis not present

## 2016-09-27 DIAGNOSIS — Z792 Long term (current) use of antibiotics: Secondary | ICD-10-CM | POA: Diagnosis not present

## 2016-09-27 DIAGNOSIS — Z298 Encounter for other specified prophylactic measures: Secondary | ICD-10-CM | POA: Diagnosis not present

## 2016-09-27 DIAGNOSIS — Z9482 Intestine transplant status: Secondary | ICD-10-CM | POA: Diagnosis not present

## 2016-09-27 DIAGNOSIS — Z794 Long term (current) use of insulin: Secondary | ICD-10-CM | POA: Diagnosis not present

## 2016-09-27 DIAGNOSIS — R739 Hyperglycemia, unspecified: Secondary | ICD-10-CM | POA: Diagnosis not present

## 2016-09-27 DIAGNOSIS — T380X5A Adverse effect of glucocorticoids and synthetic analogues, initial encounter: Secondary | ICD-10-CM | POA: Diagnosis not present

## 2016-09-28 DIAGNOSIS — K739 Chronic hepatitis, unspecified: Secondary | ICD-10-CM | POA: Diagnosis not present

## 2016-09-29 DIAGNOSIS — E43 Unspecified severe protein-calorie malnutrition: Secondary | ICD-10-CM | POA: Diagnosis not present

## 2016-09-29 DIAGNOSIS — G8929 Other chronic pain: Secondary | ICD-10-CM | POA: Diagnosis not present

## 2016-09-29 DIAGNOSIS — E119 Type 2 diabetes mellitus without complications: Secondary | ICD-10-CM | POA: Diagnosis not present

## 2016-09-30 DIAGNOSIS — D899 Disorder involving the immune mechanism, unspecified: Secondary | ICD-10-CM | POA: Diagnosis not present

## 2016-09-30 DIAGNOSIS — N182 Chronic kidney disease, stage 2 (mild): Secondary | ICD-10-CM | POA: Diagnosis not present

## 2016-09-30 DIAGNOSIS — E1122 Type 2 diabetes mellitus with diabetic chronic kidney disease: Secondary | ICD-10-CM | POA: Diagnosis not present

## 2016-09-30 DIAGNOSIS — Z9482 Intestine transplant status: Secondary | ICD-10-CM | POA: Diagnosis not present

## 2016-09-30 DIAGNOSIS — Z9483 Pancreas transplant status: Secondary | ICD-10-CM | POA: Diagnosis not present

## 2016-09-30 DIAGNOSIS — R109 Unspecified abdominal pain: Secondary | ICD-10-CM | POA: Diagnosis not present

## 2016-09-30 DIAGNOSIS — R188 Other ascites: Secondary | ICD-10-CM | POA: Diagnosis not present

## 2016-09-30 DIAGNOSIS — Z7952 Long term (current) use of systemic steroids: Secondary | ICD-10-CM | POA: Diagnosis not present

## 2016-09-30 DIAGNOSIS — Z7682 Awaiting organ transplant status: Secondary | ICD-10-CM | POA: Diagnosis not present

## 2016-10-01 DIAGNOSIS — E43 Unspecified severe protein-calorie malnutrition: Secondary | ICD-10-CM | POA: Diagnosis not present

## 2016-10-01 DIAGNOSIS — Z9482 Intestine transplant status: Secondary | ICD-10-CM | POA: Diagnosis not present

## 2016-10-02 DIAGNOSIS — Z8619 Personal history of other infectious and parasitic diseases: Secondary | ICD-10-CM | POA: Diagnosis not present

## 2016-10-02 DIAGNOSIS — Z8719 Personal history of other diseases of the digestive system: Secondary | ICD-10-CM | POA: Diagnosis not present

## 2016-10-02 DIAGNOSIS — Z885 Allergy status to narcotic agent status: Secondary | ICD-10-CM | POA: Diagnosis not present

## 2016-10-02 DIAGNOSIS — Z713 Dietary counseling and surveillance: Secondary | ICD-10-CM | POA: Diagnosis not present

## 2016-10-02 DIAGNOSIS — Y838 Other surgical procedures as the cause of abnormal reaction of the patient, or of later complication, without mention of misadventure at the time of the procedure: Secondary | ICD-10-CM | POA: Diagnosis not present

## 2016-10-02 DIAGNOSIS — Z91041 Radiographic dye allergy status: Secondary | ICD-10-CM | POA: Diagnosis not present

## 2016-10-02 DIAGNOSIS — D899 Disorder involving the immune mechanism, unspecified: Secondary | ICD-10-CM | POA: Diagnosis not present

## 2016-10-02 DIAGNOSIS — E119 Type 2 diabetes mellitus without complications: Secondary | ICD-10-CM | POA: Diagnosis not present

## 2016-10-02 DIAGNOSIS — Y33XXXA Other specified events, undetermined intent, initial encounter: Secondary | ICD-10-CM | POA: Diagnosis not present

## 2016-10-02 DIAGNOSIS — R935 Abnormal findings on diagnostic imaging of other abdominal regions, including retroperitoneum: Secondary | ICD-10-CM | POA: Diagnosis not present

## 2016-10-02 DIAGNOSIS — Z87891 Personal history of nicotine dependence: Secondary | ICD-10-CM | POA: Diagnosis not present

## 2016-10-02 DIAGNOSIS — T8130XA Disruption of wound, unspecified, initial encounter: Secondary | ICD-10-CM | POA: Diagnosis not present

## 2016-10-02 DIAGNOSIS — T8131XA Disruption of external operation (surgical) wound, not elsewhere classified, initial encounter: Secondary | ICD-10-CM | POA: Diagnosis not present

## 2016-10-02 DIAGNOSIS — Z881 Allergy status to other antibiotic agents status: Secondary | ICD-10-CM | POA: Diagnosis not present

## 2016-10-03 DIAGNOSIS — D809 Immunodeficiency with predominantly antibody defects, unspecified: Secondary | ICD-10-CM | POA: Diagnosis not present

## 2016-10-03 DIAGNOSIS — D899 Disorder involving the immune mechanism, unspecified: Secondary | ICD-10-CM | POA: Diagnosis not present

## 2016-10-03 DIAGNOSIS — T8130XA Disruption of wound, unspecified, initial encounter: Secondary | ICD-10-CM | POA: Diagnosis not present

## 2016-10-03 DIAGNOSIS — L7634 Postprocedural seroma of skin and subcutaneous tissue following other procedure: Secondary | ICD-10-CM | POA: Diagnosis not present

## 2016-10-03 DIAGNOSIS — Z944 Liver transplant status: Secondary | ICD-10-CM | POA: Diagnosis not present

## 2016-10-03 DIAGNOSIS — Z949 Transplanted organ and tissue status, unspecified: Secondary | ICD-10-CM | POA: Diagnosis not present

## 2016-10-04 DIAGNOSIS — K6389 Other specified diseases of intestine: Secondary | ICD-10-CM | POA: Diagnosis not present

## 2016-10-04 DIAGNOSIS — Z48298 Encounter for aftercare following other organ transplant: Secondary | ICD-10-CM | POA: Diagnosis not present

## 2016-10-04 DIAGNOSIS — Z7982 Long term (current) use of aspirin: Secondary | ICD-10-CM | POA: Diagnosis not present

## 2016-10-04 DIAGNOSIS — K7460 Unspecified cirrhosis of liver: Secondary | ICD-10-CM | POA: Diagnosis not present

## 2016-10-04 DIAGNOSIS — Z794 Long term (current) use of insulin: Secondary | ICD-10-CM | POA: Diagnosis not present

## 2016-10-04 DIAGNOSIS — Z9482 Intestine transplant status: Secondary | ICD-10-CM | POA: Diagnosis not present

## 2016-10-04 DIAGNOSIS — Z931 Gastrostomy status: Secondary | ICD-10-CM | POA: Diagnosis not present

## 2016-10-04 DIAGNOSIS — Z881 Allergy status to other antibiotic agents status: Secondary | ICD-10-CM | POA: Diagnosis not present

## 2016-10-04 DIAGNOSIS — Z09 Encounter for follow-up examination after completed treatment for conditions other than malignant neoplasm: Secondary | ICD-10-CM | POA: Diagnosis not present

## 2016-10-04 DIAGNOSIS — E119 Type 2 diabetes mellitus without complications: Secondary | ICD-10-CM | POA: Diagnosis not present

## 2016-10-04 DIAGNOSIS — B182 Chronic viral hepatitis C: Secondary | ICD-10-CM | POA: Diagnosis not present

## 2016-10-04 DIAGNOSIS — K529 Noninfective gastroenteritis and colitis, unspecified: Secondary | ICD-10-CM | POA: Diagnosis not present

## 2016-10-04 DIAGNOSIS — Z87442 Personal history of urinary calculi: Secondary | ICD-10-CM | POA: Diagnosis not present

## 2016-10-05 DIAGNOSIS — Z9482 Intestine transplant status: Secondary | ICD-10-CM | POA: Diagnosis not present

## 2016-10-05 DIAGNOSIS — Z7682 Awaiting organ transplant status: Secondary | ICD-10-CM | POA: Diagnosis not present

## 2016-10-05 DIAGNOSIS — D899 Disorder involving the immune mechanism, unspecified: Secondary | ICD-10-CM | POA: Diagnosis not present

## 2016-10-08 DIAGNOSIS — E119 Type 2 diabetes mellitus without complications: Secondary | ICD-10-CM | POA: Diagnosis not present

## 2016-10-08 DIAGNOSIS — I81 Portal vein thrombosis: Secondary | ICD-10-CM | POA: Diagnosis not present

## 2016-10-08 DIAGNOSIS — E8770 Fluid overload, unspecified: Secondary | ICD-10-CM | POA: Diagnosis not present

## 2016-10-08 DIAGNOSIS — T8130XD Disruption of wound, unspecified, subsequent encounter: Secondary | ICD-10-CM | POA: Diagnosis not present

## 2016-10-08 DIAGNOSIS — E43 Unspecified severe protein-calorie malnutrition: Secondary | ICD-10-CM | POA: Diagnosis not present

## 2016-10-11 DIAGNOSIS — Z87891 Personal history of nicotine dependence: Secondary | ICD-10-CM | POA: Diagnosis not present

## 2016-10-11 DIAGNOSIS — Z7982 Long term (current) use of aspirin: Secondary | ICD-10-CM | POA: Diagnosis not present

## 2016-10-11 DIAGNOSIS — K7460 Unspecified cirrhosis of liver: Secondary | ICD-10-CM | POA: Diagnosis not present

## 2016-10-11 DIAGNOSIS — Z9482 Intestine transplant status: Secondary | ICD-10-CM | POA: Diagnosis not present

## 2016-10-11 DIAGNOSIS — G8929 Other chronic pain: Secondary | ICD-10-CM | POA: Diagnosis not present

## 2016-10-11 DIAGNOSIS — Z09 Encounter for follow-up examination after completed treatment for conditions other than malignant neoplasm: Secondary | ICD-10-CM | POA: Diagnosis not present

## 2016-10-11 DIAGNOSIS — T86850 Intestine transplant rejection: Secondary | ICD-10-CM | POA: Diagnosis not present

## 2016-10-11 DIAGNOSIS — E119 Type 2 diabetes mellitus without complications: Secondary | ICD-10-CM | POA: Diagnosis not present

## 2016-10-11 DIAGNOSIS — D899 Disorder involving the immune mechanism, unspecified: Secondary | ICD-10-CM | POA: Diagnosis not present

## 2016-10-11 DIAGNOSIS — Z9884 Bariatric surgery status: Secondary | ICD-10-CM | POA: Diagnosis not present

## 2016-10-11 DIAGNOSIS — Z7682 Awaiting organ transplant status: Secondary | ICD-10-CM | POA: Diagnosis not present

## 2016-10-11 DIAGNOSIS — Z87442 Personal history of urinary calculi: Secondary | ICD-10-CM | POA: Diagnosis not present

## 2016-10-11 DIAGNOSIS — B182 Chronic viral hepatitis C: Secondary | ICD-10-CM | POA: Diagnosis not present

## 2016-10-11 DIAGNOSIS — Z794 Long term (current) use of insulin: Secondary | ICD-10-CM | POA: Diagnosis not present

## 2016-10-18 DIAGNOSIS — R188 Other ascites: Secondary | ICD-10-CM | POA: Diagnosis not present

## 2016-10-18 DIAGNOSIS — Z48298 Encounter for aftercare following other organ transplant: Secondary | ICD-10-CM | POA: Diagnosis not present

## 2016-10-18 DIAGNOSIS — Z7682 Awaiting organ transplant status: Secondary | ICD-10-CM | POA: Diagnosis not present

## 2016-10-18 DIAGNOSIS — N202 Calculus of kidney with calculus of ureter: Secondary | ICD-10-CM | POA: Diagnosis not present

## 2016-10-18 DIAGNOSIS — J90 Pleural effusion, not elsewhere classified: Secondary | ICD-10-CM | POA: Diagnosis not present

## 2016-10-18 DIAGNOSIS — F418 Other specified anxiety disorders: Secondary | ICD-10-CM | POA: Diagnosis not present

## 2016-10-18 DIAGNOSIS — Z298 Encounter for other specified prophylactic measures: Secondary | ICD-10-CM | POA: Diagnosis not present

## 2016-10-18 DIAGNOSIS — Z9049 Acquired absence of other specified parts of digestive tract: Secondary | ICD-10-CM | POA: Diagnosis not present

## 2016-10-18 DIAGNOSIS — D649 Anemia, unspecified: Secondary | ICD-10-CM | POA: Diagnosis not present

## 2016-10-18 DIAGNOSIS — M7989 Other specified soft tissue disorders: Secondary | ICD-10-CM | POA: Diagnosis not present

## 2016-10-18 DIAGNOSIS — R938 Abnormal findings on diagnostic imaging of other specified body structures: Secondary | ICD-10-CM | POA: Diagnosis not present

## 2016-10-18 DIAGNOSIS — R1011 Right upper quadrant pain: Secondary | ICD-10-CM | POA: Diagnosis not present

## 2016-10-18 DIAGNOSIS — Z944 Liver transplant status: Secondary | ICD-10-CM | POA: Diagnosis not present

## 2016-10-18 DIAGNOSIS — D899 Disorder involving the immune mechanism, unspecified: Secondary | ICD-10-CM | POA: Diagnosis not present

## 2016-10-18 DIAGNOSIS — R600 Localized edema: Secondary | ICD-10-CM | POA: Diagnosis not present

## 2016-10-18 DIAGNOSIS — Z9482 Intestine transplant status: Secondary | ICD-10-CM | POA: Diagnosis not present

## 2016-10-18 DIAGNOSIS — R918 Other nonspecific abnormal finding of lung field: Secondary | ICD-10-CM | POA: Diagnosis not present

## 2016-10-18 DIAGNOSIS — R897 Abnormal histological findings in specimens from other organs, systems and tissues: Secondary | ICD-10-CM | POA: Diagnosis not present

## 2016-10-18 DIAGNOSIS — E119 Type 2 diabetes mellitus without complications: Secondary | ICD-10-CM | POA: Diagnosis not present

## 2016-10-18 DIAGNOSIS — R6883 Chills (without fever): Secondary | ICD-10-CM | POA: Diagnosis not present

## 2016-10-18 DIAGNOSIS — R0789 Other chest pain: Secondary | ICD-10-CM | POA: Diagnosis not present

## 2016-10-18 DIAGNOSIS — N179 Acute kidney failure, unspecified: Secondary | ICD-10-CM | POA: Diagnosis not present

## 2016-10-18 DIAGNOSIS — K633 Ulcer of intestine: Secondary | ICD-10-CM | POA: Diagnosis not present

## 2016-10-18 DIAGNOSIS — Z9483 Pancreas transplant status: Secondary | ICD-10-CM | POA: Diagnosis not present

## 2016-10-18 DIAGNOSIS — G8918 Other acute postprocedural pain: Secondary | ICD-10-CM | POA: Diagnosis not present

## 2016-10-18 DIAGNOSIS — K529 Noninfective gastroenteritis and colitis, unspecified: Secondary | ICD-10-CM | POA: Diagnosis not present

## 2016-10-18 DIAGNOSIS — Z09 Encounter for follow-up examination after completed treatment for conditions other than malignant neoplasm: Secondary | ICD-10-CM | POA: Diagnosis not present

## 2016-10-18 DIAGNOSIS — R109 Unspecified abdominal pain: Secondary | ICD-10-CM | POA: Diagnosis not present

## 2016-10-19 DIAGNOSIS — D899 Disorder involving the immune mechanism, unspecified: Secondary | ICD-10-CM | POA: Diagnosis not present

## 2016-10-19 DIAGNOSIS — R1011 Right upper quadrant pain: Secondary | ICD-10-CM | POA: Diagnosis not present

## 2016-10-19 DIAGNOSIS — E119 Type 2 diabetes mellitus without complications: Secondary | ICD-10-CM | POA: Diagnosis not present

## 2016-10-19 DIAGNOSIS — R918 Other nonspecific abnormal finding of lung field: Secondary | ICD-10-CM | POA: Diagnosis not present

## 2016-10-19 DIAGNOSIS — N179 Acute kidney failure, unspecified: Secondary | ICD-10-CM | POA: Diagnosis not present

## 2016-10-19 DIAGNOSIS — F418 Other specified anxiety disorders: Secondary | ICD-10-CM | POA: Diagnosis not present

## 2016-10-19 DIAGNOSIS — J90 Pleural effusion, not elsewhere classified: Secondary | ICD-10-CM | POA: Diagnosis not present

## 2016-10-19 DIAGNOSIS — R6883 Chills (without fever): Secondary | ICD-10-CM | POA: Diagnosis not present

## 2016-10-20 DIAGNOSIS — N179 Acute kidney failure, unspecified: Secondary | ICD-10-CM | POA: Diagnosis not present

## 2016-10-20 DIAGNOSIS — D899 Disorder involving the immune mechanism, unspecified: Secondary | ICD-10-CM | POA: Diagnosis not present

## 2016-10-20 DIAGNOSIS — R1011 Right upper quadrant pain: Secondary | ICD-10-CM | POA: Diagnosis not present

## 2016-10-20 DIAGNOSIS — R6883 Chills (without fever): Secondary | ICD-10-CM | POA: Diagnosis not present

## 2016-10-20 DIAGNOSIS — R938 Abnormal findings on diagnostic imaging of other specified body structures: Secondary | ICD-10-CM | POA: Diagnosis not present

## 2016-10-20 DIAGNOSIS — F418 Other specified anxiety disorders: Secondary | ICD-10-CM | POA: Diagnosis not present

## 2016-10-20 DIAGNOSIS — E119 Type 2 diabetes mellitus without complications: Secondary | ICD-10-CM | POA: Diagnosis not present

## 2016-10-21 DIAGNOSIS — E119 Type 2 diabetes mellitus without complications: Secondary | ICD-10-CM | POA: Diagnosis not present

## 2016-10-21 DIAGNOSIS — R1011 Right upper quadrant pain: Secondary | ICD-10-CM | POA: Diagnosis not present

## 2016-10-21 DIAGNOSIS — N179 Acute kidney failure, unspecified: Secondary | ICD-10-CM | POA: Diagnosis not present

## 2016-10-21 DIAGNOSIS — R6883 Chills (without fever): Secondary | ICD-10-CM | POA: Diagnosis not present

## 2016-10-21 DIAGNOSIS — F418 Other specified anxiety disorders: Secondary | ICD-10-CM | POA: Diagnosis not present

## 2016-10-21 DIAGNOSIS — R938 Abnormal findings on diagnostic imaging of other specified body structures: Secondary | ICD-10-CM | POA: Diagnosis not present

## 2016-10-21 DIAGNOSIS — D899 Disorder involving the immune mechanism, unspecified: Secondary | ICD-10-CM | POA: Diagnosis not present

## 2016-10-22 DIAGNOSIS — R6883 Chills (without fever): Secondary | ICD-10-CM | POA: Diagnosis not present

## 2016-10-22 DIAGNOSIS — R1011 Right upper quadrant pain: Secondary | ICD-10-CM | POA: Diagnosis not present

## 2016-10-22 DIAGNOSIS — E119 Type 2 diabetes mellitus without complications: Secondary | ICD-10-CM | POA: Diagnosis not present

## 2016-10-22 DIAGNOSIS — F418 Other specified anxiety disorders: Secondary | ICD-10-CM | POA: Diagnosis not present

## 2016-10-23 DIAGNOSIS — E119 Type 2 diabetes mellitus without complications: Secondary | ICD-10-CM | POA: Diagnosis not present

## 2016-10-23 DIAGNOSIS — R6883 Chills (without fever): Secondary | ICD-10-CM | POA: Diagnosis not present

## 2016-10-23 DIAGNOSIS — N179 Acute kidney failure, unspecified: Secondary | ICD-10-CM | POA: Diagnosis not present

## 2016-10-23 DIAGNOSIS — R600 Localized edema: Secondary | ICD-10-CM | POA: Diagnosis not present

## 2016-10-23 DIAGNOSIS — F418 Other specified anxiety disorders: Secondary | ICD-10-CM | POA: Diagnosis not present

## 2016-10-23 DIAGNOSIS — R188 Other ascites: Secondary | ICD-10-CM | POA: Diagnosis not present

## 2016-10-23 DIAGNOSIS — R1011 Right upper quadrant pain: Secondary | ICD-10-CM | POA: Diagnosis not present

## 2016-10-23 DIAGNOSIS — R109 Unspecified abdominal pain: Secondary | ICD-10-CM | POA: Diagnosis not present

## 2016-10-23 DIAGNOSIS — D899 Disorder involving the immune mechanism, unspecified: Secondary | ICD-10-CM | POA: Diagnosis not present

## 2016-10-24 DIAGNOSIS — Z9482 Intestine transplant status: Secondary | ICD-10-CM | POA: Diagnosis not present

## 2016-10-24 DIAGNOSIS — Z944 Liver transplant status: Secondary | ICD-10-CM | POA: Diagnosis not present

## 2016-10-24 DIAGNOSIS — D649 Anemia, unspecified: Secondary | ICD-10-CM | POA: Diagnosis not present

## 2016-10-24 DIAGNOSIS — R897 Abnormal histological findings in specimens from other organs, systems and tissues: Secondary | ICD-10-CM | POA: Diagnosis not present

## 2016-10-24 DIAGNOSIS — R109 Unspecified abdominal pain: Secondary | ICD-10-CM | POA: Diagnosis not present

## 2016-10-24 DIAGNOSIS — D899 Disorder involving the immune mechanism, unspecified: Secondary | ICD-10-CM | POA: Diagnosis not present

## 2016-10-24 DIAGNOSIS — R938 Abnormal findings on diagnostic imaging of other specified body structures: Secondary | ICD-10-CM | POA: Diagnosis not present

## 2016-10-24 DIAGNOSIS — Z48298 Encounter for aftercare following other organ transplant: Secondary | ICD-10-CM | POA: Diagnosis not present

## 2016-10-24 DIAGNOSIS — Z298 Encounter for other specified prophylactic measures: Secondary | ICD-10-CM | POA: Diagnosis not present

## 2016-10-25 DIAGNOSIS — Z298 Encounter for other specified prophylactic measures: Secondary | ICD-10-CM | POA: Diagnosis not present

## 2016-10-25 DIAGNOSIS — Z944 Liver transplant status: Secondary | ICD-10-CM | POA: Diagnosis not present

## 2016-10-25 DIAGNOSIS — Z09 Encounter for follow-up examination after completed treatment for conditions other than malignant neoplasm: Secondary | ICD-10-CM | POA: Diagnosis not present

## 2016-10-25 DIAGNOSIS — Z48298 Encounter for aftercare following other organ transplant: Secondary | ICD-10-CM | POA: Diagnosis not present

## 2016-10-26 DIAGNOSIS — Z7682 Awaiting organ transplant status: Secondary | ICD-10-CM | POA: Diagnosis not present

## 2016-10-26 DIAGNOSIS — R897 Abnormal histological findings in specimens from other organs, systems and tissues: Secondary | ICD-10-CM | POA: Diagnosis not present

## 2016-10-26 DIAGNOSIS — Z48298 Encounter for aftercare following other organ transplant: Secondary | ICD-10-CM | POA: Diagnosis not present

## 2016-10-26 DIAGNOSIS — D899 Disorder involving the immune mechanism, unspecified: Secondary | ICD-10-CM | POA: Diagnosis not present

## 2016-10-26 DIAGNOSIS — R0789 Other chest pain: Secondary | ICD-10-CM | POA: Diagnosis not present

## 2016-10-26 DIAGNOSIS — Z298 Encounter for other specified prophylactic measures: Secondary | ICD-10-CM | POA: Diagnosis not present

## 2016-10-26 DIAGNOSIS — R938 Abnormal findings on diagnostic imaging of other specified body structures: Secondary | ICD-10-CM | POA: Diagnosis not present

## 2016-10-26 DIAGNOSIS — Z944 Liver transplant status: Secondary | ICD-10-CM | POA: Diagnosis not present

## 2016-10-26 DIAGNOSIS — D649 Anemia, unspecified: Secondary | ICD-10-CM | POA: Diagnosis not present

## 2016-10-27 DIAGNOSIS — Z298 Encounter for other specified prophylactic measures: Secondary | ICD-10-CM | POA: Diagnosis not present

## 2016-10-27 DIAGNOSIS — R109 Unspecified abdominal pain: Secondary | ICD-10-CM | POA: Diagnosis not present

## 2016-10-27 DIAGNOSIS — D649 Anemia, unspecified: Secondary | ICD-10-CM | POA: Diagnosis not present

## 2016-10-27 DIAGNOSIS — D899 Disorder involving the immune mechanism, unspecified: Secondary | ICD-10-CM | POA: Diagnosis not present

## 2016-10-27 DIAGNOSIS — G8918 Other acute postprocedural pain: Secondary | ICD-10-CM | POA: Diagnosis not present

## 2016-10-27 DIAGNOSIS — Z48298 Encounter for aftercare following other organ transplant: Secondary | ICD-10-CM | POA: Diagnosis not present

## 2016-10-27 DIAGNOSIS — R897 Abnormal histological findings in specimens from other organs, systems and tissues: Secondary | ICD-10-CM | POA: Diagnosis not present

## 2016-10-27 DIAGNOSIS — R938 Abnormal findings on diagnostic imaging of other specified body structures: Secondary | ICD-10-CM | POA: Diagnosis not present

## 2016-10-27 DIAGNOSIS — Z944 Liver transplant status: Secondary | ICD-10-CM | POA: Diagnosis not present

## 2016-10-28 DIAGNOSIS — R938 Abnormal findings on diagnostic imaging of other specified body structures: Secondary | ICD-10-CM | POA: Diagnosis not present

## 2016-10-28 DIAGNOSIS — D899 Disorder involving the immune mechanism, unspecified: Secondary | ICD-10-CM | POA: Diagnosis not present

## 2016-10-28 DIAGNOSIS — G8918 Other acute postprocedural pain: Secondary | ICD-10-CM | POA: Diagnosis not present

## 2016-10-28 DIAGNOSIS — R109 Unspecified abdominal pain: Secondary | ICD-10-CM | POA: Diagnosis not present

## 2016-10-28 DIAGNOSIS — Z48298 Encounter for aftercare following other organ transplant: Secondary | ICD-10-CM | POA: Diagnosis not present

## 2016-10-28 DIAGNOSIS — D649 Anemia, unspecified: Secondary | ICD-10-CM | POA: Diagnosis not present

## 2016-10-28 DIAGNOSIS — Z298 Encounter for other specified prophylactic measures: Secondary | ICD-10-CM | POA: Diagnosis not present

## 2016-10-28 DIAGNOSIS — Z944 Liver transplant status: Secondary | ICD-10-CM | POA: Diagnosis not present

## 2016-10-28 DIAGNOSIS — R897 Abnormal histological findings in specimens from other organs, systems and tissues: Secondary | ICD-10-CM | POA: Diagnosis not present

## 2016-10-31 DIAGNOSIS — Z9049 Acquired absence of other specified parts of digestive tract: Secondary | ICD-10-CM | POA: Diagnosis not present

## 2016-10-31 DIAGNOSIS — E119 Type 2 diabetes mellitus without complications: Secondary | ICD-10-CM | POA: Diagnosis not present

## 2016-10-31 DIAGNOSIS — F418 Other specified anxiety disorders: Secondary | ICD-10-CM | POA: Diagnosis not present

## 2016-10-31 DIAGNOSIS — Z7682 Awaiting organ transplant status: Secondary | ICD-10-CM | POA: Diagnosis not present

## 2016-10-31 DIAGNOSIS — E43 Unspecified severe protein-calorie malnutrition: Secondary | ICD-10-CM | POA: Diagnosis not present

## 2016-10-31 DIAGNOSIS — K529 Noninfective gastroenteritis and colitis, unspecified: Secondary | ICD-10-CM | POA: Diagnosis not present

## 2016-11-01 DIAGNOSIS — R1011 Right upper quadrant pain: Secondary | ICD-10-CM | POA: Diagnosis not present

## 2016-11-01 DIAGNOSIS — B182 Chronic viral hepatitis C: Secondary | ICD-10-CM | POA: Diagnosis not present

## 2016-11-01 DIAGNOSIS — E1122 Type 2 diabetes mellitus with diabetic chronic kidney disease: Secondary | ICD-10-CM | POA: Diagnosis not present

## 2016-11-01 DIAGNOSIS — G8929 Other chronic pain: Secondary | ICD-10-CM | POA: Diagnosis not present

## 2016-11-01 DIAGNOSIS — Z794 Long term (current) use of insulin: Secondary | ICD-10-CM | POA: Diagnosis not present

## 2016-11-01 DIAGNOSIS — R197 Diarrhea, unspecified: Secondary | ICD-10-CM | POA: Diagnosis not present

## 2016-11-01 DIAGNOSIS — N182 Chronic kidney disease, stage 2 (mild): Secondary | ICD-10-CM | POA: Diagnosis not present

## 2016-11-01 DIAGNOSIS — F418 Other specified anxiety disorders: Secondary | ICD-10-CM | POA: Diagnosis not present

## 2016-11-01 DIAGNOSIS — D649 Anemia, unspecified: Secondary | ICD-10-CM | POA: Diagnosis not present

## 2016-11-01 DIAGNOSIS — K633 Ulcer of intestine: Secondary | ICD-10-CM | POA: Diagnosis not present

## 2016-11-01 DIAGNOSIS — Z9482 Intestine transplant status: Secondary | ICD-10-CM | POA: Diagnosis not present

## 2016-11-01 DIAGNOSIS — Z7952 Long term (current) use of systemic steroids: Secondary | ICD-10-CM | POA: Diagnosis not present

## 2016-11-01 DIAGNOSIS — Z944 Liver transplant status: Secondary | ICD-10-CM | POA: Diagnosis not present

## 2016-11-01 DIAGNOSIS — D899 Disorder involving the immune mechanism, unspecified: Secondary | ICD-10-CM | POA: Diagnosis not present

## 2016-11-01 DIAGNOSIS — E119 Type 2 diabetes mellitus without complications: Secondary | ICD-10-CM | POA: Diagnosis not present

## 2016-11-01 DIAGNOSIS — R109 Unspecified abdominal pain: Secondary | ICD-10-CM | POA: Diagnosis not present

## 2016-11-01 DIAGNOSIS — T8691 Unspecified transplanted organ and tissue rejection: Secondary | ICD-10-CM | POA: Diagnosis not present

## 2016-11-02 DIAGNOSIS — Z9482 Intestine transplant status: Secondary | ICD-10-CM | POA: Diagnosis not present

## 2016-11-02 DIAGNOSIS — D899 Disorder involving the immune mechanism, unspecified: Secondary | ICD-10-CM | POA: Diagnosis not present

## 2016-11-02 DIAGNOSIS — R197 Diarrhea, unspecified: Secondary | ICD-10-CM | POA: Diagnosis not present

## 2016-11-02 DIAGNOSIS — Z944 Liver transplant status: Secondary | ICD-10-CM | POA: Diagnosis not present

## 2016-11-03 DIAGNOSIS — Z9482 Intestine transplant status: Secondary | ICD-10-CM | POA: Diagnosis not present

## 2016-11-03 DIAGNOSIS — R197 Diarrhea, unspecified: Secondary | ICD-10-CM | POA: Diagnosis not present

## 2016-11-03 DIAGNOSIS — K633 Ulcer of intestine: Secondary | ICD-10-CM | POA: Diagnosis not present

## 2016-11-03 DIAGNOSIS — D899 Disorder involving the immune mechanism, unspecified: Secondary | ICD-10-CM | POA: Diagnosis not present

## 2016-11-03 DIAGNOSIS — Z944 Liver transplant status: Secondary | ICD-10-CM | POA: Diagnosis not present

## 2016-11-04 DIAGNOSIS — Z944 Liver transplant status: Secondary | ICD-10-CM | POA: Diagnosis not present

## 2016-11-04 DIAGNOSIS — Z9482 Intestine transplant status: Secondary | ICD-10-CM | POA: Diagnosis not present

## 2016-11-04 DIAGNOSIS — R197 Diarrhea, unspecified: Secondary | ICD-10-CM | POA: Diagnosis not present

## 2016-11-04 DIAGNOSIS — D899 Disorder involving the immune mechanism, unspecified: Secondary | ICD-10-CM | POA: Diagnosis not present

## 2016-11-04 DIAGNOSIS — R1011 Right upper quadrant pain: Secondary | ICD-10-CM | POA: Diagnosis not present

## 2016-11-05 DIAGNOSIS — R197 Diarrhea, unspecified: Secondary | ICD-10-CM | POA: Diagnosis not present

## 2016-11-05 DIAGNOSIS — T8691 Unspecified transplanted organ and tissue rejection: Secondary | ICD-10-CM | POA: Diagnosis not present

## 2016-11-05 DIAGNOSIS — D899 Disorder involving the immune mechanism, unspecified: Secondary | ICD-10-CM | POA: Diagnosis not present

## 2016-11-05 DIAGNOSIS — F418 Other specified anxiety disorders: Secondary | ICD-10-CM | POA: Diagnosis not present

## 2016-11-05 DIAGNOSIS — Z944 Liver transplant status: Secondary | ICD-10-CM | POA: Diagnosis not present

## 2016-11-05 DIAGNOSIS — Z7952 Long term (current) use of systemic steroids: Secondary | ICD-10-CM | POA: Diagnosis not present

## 2016-11-05 DIAGNOSIS — N182 Chronic kidney disease, stage 2 (mild): Secondary | ICD-10-CM | POA: Diagnosis not present

## 2016-11-05 DIAGNOSIS — R1011 Right upper quadrant pain: Secondary | ICD-10-CM | POA: Diagnosis not present

## 2016-11-05 DIAGNOSIS — E1122 Type 2 diabetes mellitus with diabetic chronic kidney disease: Secondary | ICD-10-CM | POA: Diagnosis not present

## 2016-11-05 DIAGNOSIS — R109 Unspecified abdominal pain: Secondary | ICD-10-CM | POA: Diagnosis not present

## 2016-11-05 DIAGNOSIS — Z794 Long term (current) use of insulin: Secondary | ICD-10-CM | POA: Diagnosis not present

## 2016-11-05 DIAGNOSIS — Z9482 Intestine transplant status: Secondary | ICD-10-CM | POA: Diagnosis not present

## 2016-11-07 DIAGNOSIS — Z932 Ileostomy status: Secondary | ICD-10-CM | POA: Diagnosis not present

## 2016-11-08 DIAGNOSIS — Z9483 Pancreas transplant status: Secondary | ICD-10-CM | POA: Diagnosis not present

## 2016-11-08 DIAGNOSIS — Z792 Long term (current) use of antibiotics: Secondary | ICD-10-CM | POA: Diagnosis not present

## 2016-11-08 DIAGNOSIS — Z48288 Encounter for aftercare following multiple organ transplant: Secondary | ICD-10-CM | POA: Diagnosis not present

## 2016-11-08 DIAGNOSIS — Z9482 Intestine transplant status: Secondary | ICD-10-CM | POA: Diagnosis not present

## 2016-11-08 DIAGNOSIS — Z944 Liver transplant status: Secondary | ICD-10-CM | POA: Diagnosis not present

## 2016-11-08 DIAGNOSIS — Z949 Transplanted organ and tissue status, unspecified: Secondary | ICD-10-CM | POA: Diagnosis not present

## 2016-11-09 DIAGNOSIS — T86858 Other complications of intestine transplant: Secondary | ICD-10-CM | POA: Diagnosis not present

## 2016-11-09 DIAGNOSIS — Z794 Long term (current) use of insulin: Secondary | ICD-10-CM | POA: Diagnosis not present

## 2016-11-09 DIAGNOSIS — E119 Type 2 diabetes mellitus without complications: Secondary | ICD-10-CM | POA: Diagnosis not present

## 2016-11-09 DIAGNOSIS — K633 Ulcer of intestine: Secondary | ICD-10-CM | POA: Diagnosis not present

## 2016-11-09 DIAGNOSIS — Z9482 Intestine transplant status: Secondary | ICD-10-CM | POA: Diagnosis not present

## 2016-11-09 DIAGNOSIS — Z87442 Personal history of urinary calculi: Secondary | ICD-10-CM | POA: Diagnosis not present

## 2016-11-09 DIAGNOSIS — Z09 Encounter for follow-up examination after completed treatment for conditions other than malignant neoplasm: Secondary | ICD-10-CM | POA: Diagnosis not present

## 2016-11-09 DIAGNOSIS — Z9884 Bariatric surgery status: Secondary | ICD-10-CM | POA: Diagnosis not present

## 2016-11-09 DIAGNOSIS — B182 Chronic viral hepatitis C: Secondary | ICD-10-CM | POA: Diagnosis not present

## 2016-11-09 DIAGNOSIS — Z79899 Other long term (current) drug therapy: Secondary | ICD-10-CM | POA: Diagnosis not present

## 2016-11-09 DIAGNOSIS — Z7982 Long term (current) use of aspirin: Secondary | ICD-10-CM | POA: Diagnosis not present

## 2016-11-11 DIAGNOSIS — E119 Type 2 diabetes mellitus without complications: Secondary | ICD-10-CM | POA: Diagnosis not present

## 2016-11-14 ENCOUNTER — Other Ambulatory Visit (INDEPENDENT_AMBULATORY_CARE_PROVIDER_SITE_OTHER): Payer: Self-pay | Admitting: Internal Medicine

## 2016-11-15 DIAGNOSIS — Z9482 Intestine transplant status: Secondary | ICD-10-CM | POA: Diagnosis not present

## 2016-11-17 ENCOUNTER — Other Ambulatory Visit (INDEPENDENT_AMBULATORY_CARE_PROVIDER_SITE_OTHER): Payer: Self-pay | Admitting: *Deleted

## 2016-11-17 MED ORDER — PANTOPRAZOLE SODIUM 40 MG PO TBEC: 40.0000 mg | DELAYED_RELEASE_TABLET | Freq: Two times a day (BID) | ORAL | 3 refills | Status: DC

## 2016-11-22 DIAGNOSIS — Z9483 Pancreas transplant status: Secondary | ICD-10-CM | POA: Diagnosis not present

## 2016-11-22 DIAGNOSIS — K6389 Other specified diseases of intestine: Secondary | ICD-10-CM | POA: Diagnosis not present

## 2016-11-22 DIAGNOSIS — R109 Unspecified abdominal pain: Secondary | ICD-10-CM | POA: Diagnosis not present

## 2016-11-22 DIAGNOSIS — N179 Acute kidney failure, unspecified: Secondary | ICD-10-CM | POA: Diagnosis not present

## 2016-11-22 DIAGNOSIS — Z432 Encounter for attention to ileostomy: Secondary | ICD-10-CM | POA: Diagnosis not present

## 2016-11-22 DIAGNOSIS — L988 Other specified disorders of the skin and subcutaneous tissue: Secondary | ICD-10-CM | POA: Diagnosis not present

## 2016-11-22 DIAGNOSIS — Z932 Ileostomy status: Secondary | ICD-10-CM | POA: Diagnosis not present

## 2016-11-22 DIAGNOSIS — N183 Chronic kidney disease, stage 3 (moderate): Secondary | ICD-10-CM | POA: Diagnosis not present

## 2016-11-22 DIAGNOSIS — N170 Acute kidney failure with tubular necrosis: Secondary | ICD-10-CM | POA: Diagnosis not present

## 2016-11-22 DIAGNOSIS — Z298 Encounter for other specified prophylactic measures: Secondary | ICD-10-CM | POA: Diagnosis not present

## 2016-11-22 DIAGNOSIS — Z944 Liver transplant status: Secondary | ICD-10-CM | POA: Diagnosis not present

## 2016-11-22 DIAGNOSIS — R7989 Other specified abnormal findings of blood chemistry: Secondary | ICD-10-CM | POA: Diagnosis not present

## 2016-11-22 DIAGNOSIS — K567 Ileus, unspecified: Secondary | ICD-10-CM | POA: Diagnosis not present

## 2016-11-22 DIAGNOSIS — R188 Other ascites: Secondary | ICD-10-CM | POA: Diagnosis not present

## 2016-11-22 DIAGNOSIS — Z9482 Intestine transplant status: Secondary | ICD-10-CM | POA: Diagnosis not present

## 2016-11-22 DIAGNOSIS — D899 Disorder involving the immune mechanism, unspecified: Secondary | ICD-10-CM | POA: Diagnosis not present

## 2016-11-22 DIAGNOSIS — N189 Chronic kidney disease, unspecified: Secondary | ICD-10-CM | POA: Diagnosis not present

## 2016-11-22 DIAGNOSIS — Z48288 Encounter for aftercare following multiple organ transplant: Secondary | ICD-10-CM | POA: Diagnosis not present

## 2016-11-22 DIAGNOSIS — R1084 Generalized abdominal pain: Secondary | ICD-10-CM | POA: Diagnosis not present

## 2016-11-22 DIAGNOSIS — Z9889 Other specified postprocedural states: Secondary | ICD-10-CM | POA: Diagnosis not present

## 2016-11-22 DIAGNOSIS — E1122 Type 2 diabetes mellitus with diabetic chronic kidney disease: Secondary | ICD-10-CM | POA: Diagnosis not present

## 2016-11-22 DIAGNOSIS — K435 Parastomal hernia without obstruction or  gangrene: Secondary | ICD-10-CM | POA: Diagnosis not present

## 2016-11-22 DIAGNOSIS — F418 Other specified anxiety disorders: Secondary | ICD-10-CM | POA: Diagnosis not present

## 2016-11-22 DIAGNOSIS — Z09 Encounter for follow-up examination after completed treatment for conditions other than malignant neoplasm: Secondary | ICD-10-CM | POA: Diagnosis not present

## 2016-11-22 DIAGNOSIS — G8918 Other acute postprocedural pain: Secondary | ICD-10-CM | POA: Diagnosis not present

## 2016-11-22 DIAGNOSIS — Z431 Encounter for attention to gastrostomy: Secondary | ICD-10-CM | POA: Diagnosis not present

## 2016-11-22 DIAGNOSIS — E872 Acidosis: Secondary | ICD-10-CM | POA: Diagnosis not present

## 2016-11-24 DIAGNOSIS — N170 Acute kidney failure with tubular necrosis: Secondary | ICD-10-CM | POA: Diagnosis not present

## 2016-11-24 DIAGNOSIS — N183 Chronic kidney disease, stage 3 (moderate): Secondary | ICD-10-CM | POA: Diagnosis not present

## 2016-11-24 DIAGNOSIS — E872 Acidosis: Secondary | ICD-10-CM | POA: Diagnosis not present

## 2016-11-24 DIAGNOSIS — R7989 Other specified abnormal findings of blood chemistry: Secondary | ICD-10-CM | POA: Diagnosis not present

## 2016-11-24 DIAGNOSIS — L988 Other specified disorders of the skin and subcutaneous tissue: Secondary | ICD-10-CM | POA: Diagnosis not present

## 2016-11-24 DIAGNOSIS — Z932 Ileostomy status: Secondary | ICD-10-CM | POA: Diagnosis not present

## 2016-11-24 DIAGNOSIS — D899 Disorder involving the immune mechanism, unspecified: Secondary | ICD-10-CM | POA: Diagnosis not present

## 2016-11-25 DIAGNOSIS — Z432 Encounter for attention to ileostomy: Secondary | ICD-10-CM | POA: Diagnosis not present

## 2016-11-25 DIAGNOSIS — Z431 Encounter for attention to gastrostomy: Secondary | ICD-10-CM | POA: Diagnosis not present

## 2016-11-25 DIAGNOSIS — K435 Parastomal hernia without obstruction or  gangrene: Secondary | ICD-10-CM | POA: Diagnosis not present

## 2016-11-25 DIAGNOSIS — D899 Disorder involving the immune mechanism, unspecified: Secondary | ICD-10-CM | POA: Diagnosis not present

## 2016-11-25 DIAGNOSIS — N189 Chronic kidney disease, unspecified: Secondary | ICD-10-CM | POA: Diagnosis not present

## 2016-11-25 DIAGNOSIS — Z9483 Pancreas transplant status: Secondary | ICD-10-CM | POA: Diagnosis not present

## 2016-11-25 DIAGNOSIS — Z9889 Other specified postprocedural states: Secondary | ICD-10-CM | POA: Diagnosis not present

## 2016-11-26 DIAGNOSIS — G8918 Other acute postprocedural pain: Secondary | ICD-10-CM | POA: Diagnosis not present

## 2016-11-26 DIAGNOSIS — Z9889 Other specified postprocedural states: Secondary | ICD-10-CM | POA: Diagnosis not present

## 2016-11-26 DIAGNOSIS — D899 Disorder involving the immune mechanism, unspecified: Secondary | ICD-10-CM | POA: Diagnosis not present

## 2016-11-26 DIAGNOSIS — N189 Chronic kidney disease, unspecified: Secondary | ICD-10-CM | POA: Diagnosis not present

## 2016-11-27 DIAGNOSIS — G8918 Other acute postprocedural pain: Secondary | ICD-10-CM | POA: Diagnosis not present

## 2016-11-27 DIAGNOSIS — Z9889 Other specified postprocedural states: Secondary | ICD-10-CM | POA: Diagnosis not present

## 2016-11-27 DIAGNOSIS — D899 Disorder involving the immune mechanism, unspecified: Secondary | ICD-10-CM | POA: Diagnosis not present

## 2016-11-27 DIAGNOSIS — N189 Chronic kidney disease, unspecified: Secondary | ICD-10-CM | POA: Diagnosis not present

## 2016-11-28 DIAGNOSIS — K6389 Other specified diseases of intestine: Secondary | ICD-10-CM | POA: Diagnosis not present

## 2016-11-28 DIAGNOSIS — Z9889 Other specified postprocedural states: Secondary | ICD-10-CM | POA: Diagnosis not present

## 2016-11-28 DIAGNOSIS — D899 Disorder involving the immune mechanism, unspecified: Secondary | ICD-10-CM | POA: Diagnosis not present

## 2016-11-28 DIAGNOSIS — R188 Other ascites: Secondary | ICD-10-CM | POA: Diagnosis not present

## 2016-11-28 DIAGNOSIS — R109 Unspecified abdominal pain: Secondary | ICD-10-CM | POA: Diagnosis not present

## 2016-11-28 DIAGNOSIS — R1084 Generalized abdominal pain: Secondary | ICD-10-CM | POA: Diagnosis not present

## 2016-11-29 DIAGNOSIS — R1084 Generalized abdominal pain: Secondary | ICD-10-CM | POA: Diagnosis not present

## 2016-11-29 DIAGNOSIS — D899 Disorder involving the immune mechanism, unspecified: Secondary | ICD-10-CM | POA: Diagnosis not present

## 2016-11-30 DIAGNOSIS — R1084 Generalized abdominal pain: Secondary | ICD-10-CM | POA: Diagnosis not present

## 2016-11-30 DIAGNOSIS — D899 Disorder involving the immune mechanism, unspecified: Secondary | ICD-10-CM | POA: Diagnosis not present

## 2016-12-01 DIAGNOSIS — R1084 Generalized abdominal pain: Secondary | ICD-10-CM | POA: Diagnosis not present

## 2016-12-01 DIAGNOSIS — Z298 Encounter for other specified prophylactic measures: Secondary | ICD-10-CM | POA: Diagnosis not present

## 2016-12-01 DIAGNOSIS — D899 Disorder involving the immune mechanism, unspecified: Secondary | ICD-10-CM | POA: Diagnosis not present

## 2016-12-01 DIAGNOSIS — Z48288 Encounter for aftercare following multiple organ transplant: Secondary | ICD-10-CM | POA: Diagnosis not present

## 2016-12-03 DIAGNOSIS — M6258 Muscle wasting and atrophy, not elsewhere classified, other site: Secondary | ICD-10-CM | POA: Diagnosis not present

## 2016-12-03 DIAGNOSIS — R1030 Lower abdominal pain, unspecified: Secondary | ICD-10-CM | POA: Diagnosis not present

## 2016-12-03 DIAGNOSIS — R10813 Right lower quadrant abdominal tenderness: Secondary | ICD-10-CM | POA: Diagnosis not present

## 2016-12-05 DIAGNOSIS — E559 Vitamin D deficiency, unspecified: Secondary | ICD-10-CM | POA: Diagnosis not present

## 2016-12-05 DIAGNOSIS — D649 Anemia, unspecified: Secondary | ICD-10-CM | POA: Diagnosis not present

## 2016-12-05 DIAGNOSIS — K909 Intestinal malabsorption, unspecified: Secondary | ICD-10-CM | POA: Diagnosis not present

## 2016-12-05 DIAGNOSIS — B182 Chronic viral hepatitis C: Secondary | ICD-10-CM | POA: Diagnosis not present

## 2016-12-05 DIAGNOSIS — R1011 Right upper quadrant pain: Secondary | ICD-10-CM | POA: Diagnosis not present

## 2016-12-05 DIAGNOSIS — Z9482 Intestine transplant status: Secondary | ICD-10-CM | POA: Diagnosis not present

## 2016-12-05 DIAGNOSIS — Z48288 Encounter for aftercare following multiple organ transplant: Secondary | ICD-10-CM | POA: Diagnosis not present

## 2016-12-05 DIAGNOSIS — E119 Type 2 diabetes mellitus without complications: Secondary | ICD-10-CM | POA: Diagnosis not present

## 2016-12-05 DIAGNOSIS — K7460 Unspecified cirrhosis of liver: Secondary | ICD-10-CM | POA: Diagnosis not present

## 2016-12-05 DIAGNOSIS — R197 Diarrhea, unspecified: Secondary | ICD-10-CM | POA: Diagnosis not present

## 2016-12-05 DIAGNOSIS — E43 Unspecified severe protein-calorie malnutrition: Secondary | ICD-10-CM | POA: Diagnosis not present

## 2016-12-07 DIAGNOSIS — K909 Intestinal malabsorption, unspecified: Secondary | ICD-10-CM | POA: Diagnosis not present

## 2016-12-07 DIAGNOSIS — K648 Other hemorrhoids: Secondary | ICD-10-CM | POA: Diagnosis not present

## 2016-12-07 DIAGNOSIS — D649 Anemia, unspecified: Secondary | ICD-10-CM | POA: Diagnosis not present

## 2016-12-07 DIAGNOSIS — R197 Diarrhea, unspecified: Secondary | ICD-10-CM | POA: Diagnosis not present

## 2016-12-07 DIAGNOSIS — Z9482 Intestine transplant status: Secondary | ICD-10-CM | POA: Diagnosis not present

## 2016-12-07 DIAGNOSIS — Z9885 Transplanted organ removal status: Secondary | ICD-10-CM | POA: Diagnosis not present

## 2016-12-07 DIAGNOSIS — Z980 Intestinal bypass and anastomosis status: Secondary | ICD-10-CM | POA: Diagnosis not present

## 2016-12-07 DIAGNOSIS — N189 Chronic kidney disease, unspecified: Secondary | ICD-10-CM | POA: Diagnosis not present

## 2016-12-07 DIAGNOSIS — K7469 Other cirrhosis of liver: Secondary | ICD-10-CM | POA: Diagnosis not present

## 2016-12-07 DIAGNOSIS — K5730 Diverticulosis of large intestine without perforation or abscess without bleeding: Secondary | ICD-10-CM | POA: Diagnosis not present

## 2016-12-07 DIAGNOSIS — B182 Chronic viral hepatitis C: Secondary | ICD-10-CM | POA: Diagnosis not present

## 2016-12-07 DIAGNOSIS — E119 Type 2 diabetes mellitus without complications: Secondary | ICD-10-CM | POA: Diagnosis not present

## 2016-12-07 DIAGNOSIS — Z9884 Bariatric surgery status: Secondary | ICD-10-CM | POA: Diagnosis not present

## 2016-12-07 DIAGNOSIS — Z9483 Pancreas transplant status: Secondary | ICD-10-CM | POA: Diagnosis not present

## 2016-12-07 DIAGNOSIS — D631 Anemia in chronic kidney disease: Secondary | ICD-10-CM | POA: Diagnosis not present

## 2016-12-08 DIAGNOSIS — Z7682 Awaiting organ transplant status: Secondary | ICD-10-CM | POA: Diagnosis not present

## 2016-12-08 DIAGNOSIS — D899 Disorder involving the immune mechanism, unspecified: Secondary | ICD-10-CM | POA: Diagnosis not present

## 2016-12-13 DIAGNOSIS — K648 Other hemorrhoids: Secondary | ICD-10-CM | POA: Diagnosis not present

## 2016-12-13 DIAGNOSIS — E119 Type 2 diabetes mellitus without complications: Secondary | ICD-10-CM | POA: Diagnosis not present

## 2016-12-13 DIAGNOSIS — R109 Unspecified abdominal pain: Secondary | ICD-10-CM | POA: Diagnosis not present

## 2016-12-13 DIAGNOSIS — D649 Anemia, unspecified: Secondary | ICD-10-CM | POA: Diagnosis not present

## 2016-12-13 DIAGNOSIS — Z9482 Intestine transplant status: Secondary | ICD-10-CM | POA: Diagnosis not present

## 2016-12-13 DIAGNOSIS — R188 Other ascites: Secondary | ICD-10-CM | POA: Diagnosis not present

## 2016-12-13 DIAGNOSIS — Z792 Long term (current) use of antibiotics: Secondary | ICD-10-CM | POA: Diagnosis not present

## 2016-12-13 DIAGNOSIS — Z9483 Pancreas transplant status: Secondary | ICD-10-CM | POA: Diagnosis not present

## 2016-12-13 DIAGNOSIS — Z7682 Awaiting organ transplant status: Secondary | ICD-10-CM | POA: Diagnosis not present

## 2016-12-13 DIAGNOSIS — E1122 Type 2 diabetes mellitus with diabetic chronic kidney disease: Secondary | ICD-10-CM | POA: Diagnosis not present

## 2016-12-13 DIAGNOSIS — Z944 Liver transplant status: Secondary | ICD-10-CM | POA: Diagnosis not present

## 2016-12-13 DIAGNOSIS — K922 Gastrointestinal hemorrhage, unspecified: Secondary | ICD-10-CM | POA: Diagnosis not present

## 2016-12-13 DIAGNOSIS — N189 Chronic kidney disease, unspecified: Secondary | ICD-10-CM | POA: Diagnosis not present

## 2016-12-13 DIAGNOSIS — K5730 Diverticulosis of large intestine without perforation or abscess without bleeding: Secondary | ICD-10-CM | POA: Diagnosis not present

## 2016-12-13 DIAGNOSIS — A0472 Enterocolitis due to Clostridium difficile, not specified as recurrent: Secondary | ICD-10-CM | POA: Diagnosis not present

## 2016-12-13 DIAGNOSIS — D899 Disorder involving the immune mechanism, unspecified: Secondary | ICD-10-CM | POA: Diagnosis not present

## 2016-12-14 DIAGNOSIS — Z7682 Awaiting organ transplant status: Secondary | ICD-10-CM | POA: Diagnosis not present

## 2016-12-14 DIAGNOSIS — R109 Unspecified abdominal pain: Secondary | ICD-10-CM | POA: Diagnosis not present

## 2016-12-14 DIAGNOSIS — Z9482 Intestine transplant status: Secondary | ICD-10-CM | POA: Diagnosis not present

## 2016-12-14 DIAGNOSIS — R188 Other ascites: Secondary | ICD-10-CM | POA: Diagnosis not present

## 2016-12-15 DIAGNOSIS — Z792 Long term (current) use of antibiotics: Secondary | ICD-10-CM | POA: Diagnosis not present

## 2016-12-15 DIAGNOSIS — Z9482 Intestine transplant status: Secondary | ICD-10-CM | POA: Diagnosis not present

## 2016-12-15 DIAGNOSIS — D899 Disorder involving the immune mechanism, unspecified: Secondary | ICD-10-CM | POA: Diagnosis not present

## 2016-12-15 DIAGNOSIS — K922 Gastrointestinal hemorrhage, unspecified: Secondary | ICD-10-CM | POA: Diagnosis not present

## 2016-12-15 DIAGNOSIS — R188 Other ascites: Secondary | ICD-10-CM | POA: Diagnosis not present

## 2016-12-15 DIAGNOSIS — Z944 Liver transplant status: Secondary | ICD-10-CM | POA: Diagnosis not present

## 2016-12-15 DIAGNOSIS — Z7682 Awaiting organ transplant status: Secondary | ICD-10-CM | POA: Diagnosis not present

## 2016-12-15 DIAGNOSIS — A0472 Enterocolitis due to Clostridium difficile, not specified as recurrent: Secondary | ICD-10-CM | POA: Diagnosis not present

## 2016-12-16 DIAGNOSIS — D899 Disorder involving the immune mechanism, unspecified: Secondary | ICD-10-CM | POA: Diagnosis not present

## 2016-12-16 DIAGNOSIS — Z944 Liver transplant status: Secondary | ICD-10-CM | POA: Diagnosis not present

## 2016-12-16 DIAGNOSIS — A0472 Enterocolitis due to Clostridium difficile, not specified as recurrent: Secondary | ICD-10-CM | POA: Diagnosis not present

## 2016-12-16 DIAGNOSIS — K922 Gastrointestinal hemorrhage, unspecified: Secondary | ICD-10-CM | POA: Diagnosis not present

## 2016-12-16 DIAGNOSIS — Z9482 Intestine transplant status: Secondary | ICD-10-CM | POA: Diagnosis not present

## 2016-12-16 DIAGNOSIS — E119 Type 2 diabetes mellitus without complications: Secondary | ICD-10-CM | POA: Diagnosis not present

## 2016-12-17 DIAGNOSIS — Z9482 Intestine transplant status: Secondary | ICD-10-CM | POA: Diagnosis not present

## 2016-12-17 DIAGNOSIS — K922 Gastrointestinal hemorrhage, unspecified: Secondary | ICD-10-CM | POA: Diagnosis not present

## 2016-12-17 DIAGNOSIS — D899 Disorder involving the immune mechanism, unspecified: Secondary | ICD-10-CM | POA: Diagnosis not present

## 2016-12-17 DIAGNOSIS — A0472 Enterocolitis due to Clostridium difficile, not specified as recurrent: Secondary | ICD-10-CM | POA: Diagnosis not present

## 2016-12-17 DIAGNOSIS — E119 Type 2 diabetes mellitus without complications: Secondary | ICD-10-CM | POA: Diagnosis not present

## 2016-12-17 DIAGNOSIS — Z944 Liver transplant status: Secondary | ICD-10-CM | POA: Diagnosis not present

## 2016-12-18 DIAGNOSIS — A0472 Enterocolitis due to Clostridium difficile, not specified as recurrent: Secondary | ICD-10-CM | POA: Diagnosis not present

## 2016-12-18 DIAGNOSIS — Z9482 Intestine transplant status: Secondary | ICD-10-CM | POA: Diagnosis not present

## 2016-12-18 DIAGNOSIS — K922 Gastrointestinal hemorrhage, unspecified: Secondary | ICD-10-CM | POA: Diagnosis not present

## 2016-12-18 DIAGNOSIS — Z944 Liver transplant status: Secondary | ICD-10-CM | POA: Diagnosis not present

## 2016-12-18 DIAGNOSIS — E119 Type 2 diabetes mellitus without complications: Secondary | ICD-10-CM | POA: Diagnosis not present

## 2016-12-18 DIAGNOSIS — D899 Disorder involving the immune mechanism, unspecified: Secondary | ICD-10-CM | POA: Diagnosis not present

## 2016-12-20 DIAGNOSIS — D899 Disorder involving the immune mechanism, unspecified: Secondary | ICD-10-CM | POA: Diagnosis not present

## 2016-12-20 DIAGNOSIS — Z7682 Awaiting organ transplant status: Secondary | ICD-10-CM | POA: Diagnosis not present

## 2016-12-26 DIAGNOSIS — D899 Disorder involving the immune mechanism, unspecified: Secondary | ICD-10-CM | POA: Diagnosis not present

## 2016-12-26 DIAGNOSIS — Z7682 Awaiting organ transplant status: Secondary | ICD-10-CM | POA: Diagnosis not present

## 2016-12-26 DIAGNOSIS — E119 Type 2 diabetes mellitus without complications: Secondary | ICD-10-CM | POA: Diagnosis not present

## 2016-12-30 DIAGNOSIS — Z7682 Awaiting organ transplant status: Secondary | ICD-10-CM | POA: Diagnosis not present

## 2017-01-03 DIAGNOSIS — R10813 Right lower quadrant abdominal tenderness: Secondary | ICD-10-CM | POA: Diagnosis not present

## 2017-01-03 DIAGNOSIS — M6258 Muscle wasting and atrophy, not elsewhere classified, other site: Secondary | ICD-10-CM | POA: Diagnosis not present

## 2017-01-03 DIAGNOSIS — R1030 Lower abdominal pain, unspecified: Secondary | ICD-10-CM | POA: Diagnosis not present

## 2017-01-04 DIAGNOSIS — Z7682 Awaiting organ transplant status: Secondary | ICD-10-CM | POA: Diagnosis not present

## 2017-01-04 DIAGNOSIS — D899 Disorder involving the immune mechanism, unspecified: Secondary | ICD-10-CM | POA: Diagnosis not present

## 2017-01-10 DIAGNOSIS — R51 Headache: Secondary | ICD-10-CM | POA: Diagnosis not present

## 2017-01-16 DIAGNOSIS — Z9885 Transplanted organ removal status: Secondary | ICD-10-CM | POA: Diagnosis not present

## 2017-01-19 DIAGNOSIS — Z9482 Intestine transplant status: Secondary | ICD-10-CM | POA: Diagnosis not present

## 2017-01-19 DIAGNOSIS — Z7682 Awaiting organ transplant status: Secondary | ICD-10-CM | POA: Diagnosis not present

## 2017-01-24 DIAGNOSIS — Z7682 Awaiting organ transplant status: Secondary | ICD-10-CM | POA: Diagnosis not present

## 2017-01-24 DIAGNOSIS — D899 Disorder involving the immune mechanism, unspecified: Secondary | ICD-10-CM | POA: Diagnosis not present

## 2017-02-02 DIAGNOSIS — R9089 Other abnormal findings on diagnostic imaging of central nervous system: Secondary | ICD-10-CM | POA: Diagnosis not present

## 2017-02-02 DIAGNOSIS — R10813 Right lower quadrant abdominal tenderness: Secondary | ICD-10-CM | POA: Diagnosis not present

## 2017-02-02 DIAGNOSIS — R1030 Lower abdominal pain, unspecified: Secondary | ICD-10-CM | POA: Diagnosis not present

## 2017-02-02 DIAGNOSIS — M6258 Muscle wasting and atrophy, not elsewhere classified, other site: Secondary | ICD-10-CM | POA: Diagnosis not present

## 2017-02-02 DIAGNOSIS — R51 Headache: Secondary | ICD-10-CM | POA: Diagnosis not present

## 2017-02-10 DIAGNOSIS — K5730 Diverticulosis of large intestine without perforation or abscess without bleeding: Secondary | ICD-10-CM | POA: Diagnosis not present

## 2017-02-10 DIAGNOSIS — Z4823 Encounter for aftercare following liver transplant: Secondary | ICD-10-CM | POA: Diagnosis not present

## 2017-02-10 DIAGNOSIS — N189 Chronic kidney disease, unspecified: Secondary | ICD-10-CM | POA: Diagnosis not present

## 2017-02-10 DIAGNOSIS — M79671 Pain in right foot: Secondary | ICD-10-CM | POA: Diagnosis not present

## 2017-02-10 DIAGNOSIS — Z7682 Awaiting organ transplant status: Secondary | ICD-10-CM | POA: Diagnosis not present

## 2017-02-10 DIAGNOSIS — Z87891 Personal history of nicotine dependence: Secondary | ICD-10-CM | POA: Diagnosis not present

## 2017-02-10 DIAGNOSIS — R188 Other ascites: Secondary | ICD-10-CM | POA: Diagnosis not present

## 2017-02-10 DIAGNOSIS — T451X5A Adverse effect of antineoplastic and immunosuppressive drugs, initial encounter: Secondary | ICD-10-CM | POA: Diagnosis not present

## 2017-02-10 DIAGNOSIS — R51 Headache: Secondary | ICD-10-CM | POA: Diagnosis not present

## 2017-02-10 DIAGNOSIS — Z980 Intestinal bypass and anastomosis status: Secondary | ICD-10-CM | POA: Diagnosis not present

## 2017-02-10 DIAGNOSIS — K644 Residual hemorrhoidal skin tags: Secondary | ICD-10-CM | POA: Diagnosis not present

## 2017-02-10 DIAGNOSIS — Z9482 Intestine transplant status: Secondary | ICD-10-CM | POA: Diagnosis not present

## 2017-02-10 DIAGNOSIS — E1122 Type 2 diabetes mellitus with diabetic chronic kidney disease: Secondary | ICD-10-CM | POA: Diagnosis not present

## 2017-02-10 DIAGNOSIS — E559 Vitamin D deficiency, unspecified: Secondary | ICD-10-CM | POA: Diagnosis not present

## 2017-02-10 DIAGNOSIS — Z8619 Personal history of other infectious and parasitic diseases: Secondary | ICD-10-CM | POA: Diagnosis not present

## 2017-02-10 DIAGNOSIS — R197 Diarrhea, unspecified: Secondary | ICD-10-CM | POA: Diagnosis not present

## 2017-02-10 DIAGNOSIS — Z7982 Long term (current) use of aspirin: Secondary | ICD-10-CM | POA: Diagnosis not present

## 2017-02-10 DIAGNOSIS — N179 Acute kidney failure, unspecified: Secondary | ICD-10-CM | POA: Diagnosis not present

## 2017-02-10 DIAGNOSIS — G8929 Other chronic pain: Secondary | ICD-10-CM | POA: Diagnosis not present

## 2017-02-10 DIAGNOSIS — K222 Esophageal obstruction: Secondary | ICD-10-CM | POA: Diagnosis not present

## 2017-02-10 DIAGNOSIS — D899 Disorder involving the immune mechanism, unspecified: Secondary | ICD-10-CM | POA: Diagnosis not present

## 2017-02-10 DIAGNOSIS — N200 Calculus of kidney: Secondary | ICD-10-CM | POA: Diagnosis not present

## 2017-02-10 DIAGNOSIS — R945 Abnormal results of liver function studies: Secondary | ICD-10-CM | POA: Diagnosis not present

## 2017-02-10 DIAGNOSIS — Z7409 Other reduced mobility: Secondary | ICD-10-CM | POA: Diagnosis not present

## 2017-02-10 DIAGNOSIS — I517 Cardiomegaly: Secondary | ICD-10-CM | POA: Diagnosis not present

## 2017-02-10 DIAGNOSIS — Z9483 Pancreas transplant status: Secondary | ICD-10-CM | POA: Diagnosis not present

## 2017-02-10 DIAGNOSIS — Z9885 Transplanted organ removal status: Secondary | ICD-10-CM | POA: Diagnosis not present

## 2017-02-10 DIAGNOSIS — Z944 Liver transplant status: Secondary | ICD-10-CM | POA: Diagnosis not present

## 2017-02-10 DIAGNOSIS — Z79899 Other long term (current) drug therapy: Secondary | ICD-10-CM | POA: Diagnosis not present

## 2017-02-10 DIAGNOSIS — Z09 Encounter for follow-up examination after completed treatment for conditions other than malignant neoplasm: Secondary | ICD-10-CM | POA: Diagnosis not present

## 2017-02-10 DIAGNOSIS — R7989 Other specified abnormal findings of blood chemistry: Secondary | ICD-10-CM | POA: Diagnosis not present

## 2017-02-10 DIAGNOSIS — M79661 Pain in right lower leg: Secondary | ICD-10-CM | POA: Diagnosis not present

## 2017-02-11 DIAGNOSIS — D899 Disorder involving the immune mechanism, unspecified: Secondary | ICD-10-CM | POA: Diagnosis not present

## 2017-02-11 DIAGNOSIS — R51 Headache: Secondary | ICD-10-CM | POA: Diagnosis not present

## 2017-02-11 DIAGNOSIS — Z944 Liver transplant status: Secondary | ICD-10-CM | POA: Diagnosis not present

## 2017-02-11 DIAGNOSIS — Z9482 Intestine transplant status: Secondary | ICD-10-CM | POA: Diagnosis not present

## 2017-02-12 DIAGNOSIS — R51 Headache: Secondary | ICD-10-CM | POA: Diagnosis not present

## 2017-02-12 DIAGNOSIS — D899 Disorder involving the immune mechanism, unspecified: Secondary | ICD-10-CM | POA: Diagnosis not present

## 2017-02-12 DIAGNOSIS — Z944 Liver transplant status: Secondary | ICD-10-CM | POA: Diagnosis not present

## 2017-02-12 DIAGNOSIS — Z9482 Intestine transplant status: Secondary | ICD-10-CM | POA: Diagnosis not present

## 2017-02-13 DIAGNOSIS — R51 Headache: Secondary | ICD-10-CM | POA: Diagnosis not present

## 2017-02-13 DIAGNOSIS — D899 Disorder involving the immune mechanism, unspecified: Secondary | ICD-10-CM | POA: Diagnosis not present

## 2017-02-13 DIAGNOSIS — Z09 Encounter for follow-up examination after completed treatment for conditions other than malignant neoplasm: Secondary | ICD-10-CM | POA: Diagnosis not present

## 2017-02-13 DIAGNOSIS — Z4823 Encounter for aftercare following liver transplant: Secondary | ICD-10-CM | POA: Diagnosis not present

## 2017-02-13 DIAGNOSIS — R197 Diarrhea, unspecified: Secondary | ICD-10-CM | POA: Diagnosis not present

## 2017-02-13 DIAGNOSIS — Z7682 Awaiting organ transplant status: Secondary | ICD-10-CM | POA: Diagnosis not present

## 2017-02-14 DIAGNOSIS — D899 Disorder involving the immune mechanism, unspecified: Secondary | ICD-10-CM | POA: Diagnosis not present

## 2017-02-14 DIAGNOSIS — R51 Headache: Secondary | ICD-10-CM | POA: Diagnosis not present

## 2017-02-14 DIAGNOSIS — Z7682 Awaiting organ transplant status: Secondary | ICD-10-CM | POA: Diagnosis not present

## 2017-02-14 DIAGNOSIS — Z4823 Encounter for aftercare following liver transplant: Secondary | ICD-10-CM | POA: Diagnosis not present

## 2017-02-14 DIAGNOSIS — R197 Diarrhea, unspecified: Secondary | ICD-10-CM | POA: Diagnosis not present

## 2017-02-15 DIAGNOSIS — Z7682 Awaiting organ transplant status: Secondary | ICD-10-CM | POA: Diagnosis not present

## 2017-02-15 DIAGNOSIS — R51 Headache: Secondary | ICD-10-CM | POA: Diagnosis not present

## 2017-02-15 DIAGNOSIS — D899 Disorder involving the immune mechanism, unspecified: Secondary | ICD-10-CM | POA: Diagnosis not present

## 2017-02-15 DIAGNOSIS — Z4823 Encounter for aftercare following liver transplant: Secondary | ICD-10-CM | POA: Diagnosis not present

## 2017-02-15 DIAGNOSIS — R197 Diarrhea, unspecified: Secondary | ICD-10-CM | POA: Diagnosis not present

## 2017-02-16 DIAGNOSIS — Z7682 Awaiting organ transplant status: Secondary | ICD-10-CM | POA: Diagnosis not present

## 2017-02-16 DIAGNOSIS — Z4823 Encounter for aftercare following liver transplant: Secondary | ICD-10-CM | POA: Diagnosis not present

## 2017-02-16 DIAGNOSIS — D899 Disorder involving the immune mechanism, unspecified: Secondary | ICD-10-CM | POA: Diagnosis not present

## 2017-02-16 DIAGNOSIS — R51 Headache: Secondary | ICD-10-CM | POA: Diagnosis not present

## 2017-02-16 DIAGNOSIS — R197 Diarrhea, unspecified: Secondary | ICD-10-CM | POA: Diagnosis not present

## 2017-02-17 DIAGNOSIS — Z4823 Encounter for aftercare following liver transplant: Secondary | ICD-10-CM | POA: Diagnosis not present

## 2017-02-17 DIAGNOSIS — R51 Headache: Secondary | ICD-10-CM | POA: Diagnosis not present

## 2017-02-17 DIAGNOSIS — R197 Diarrhea, unspecified: Secondary | ICD-10-CM | POA: Diagnosis not present

## 2017-02-17 DIAGNOSIS — D899 Disorder involving the immune mechanism, unspecified: Secondary | ICD-10-CM | POA: Diagnosis not present

## 2017-02-17 DIAGNOSIS — Z944 Liver transplant status: Secondary | ICD-10-CM | POA: Diagnosis not present

## 2017-02-18 DIAGNOSIS — Z944 Liver transplant status: Secondary | ICD-10-CM | POA: Diagnosis not present

## 2017-02-18 DIAGNOSIS — R197 Diarrhea, unspecified: Secondary | ICD-10-CM | POA: Diagnosis not present

## 2017-02-18 DIAGNOSIS — Z4823 Encounter for aftercare following liver transplant: Secondary | ICD-10-CM | POA: Diagnosis not present

## 2017-02-18 DIAGNOSIS — R51 Headache: Secondary | ICD-10-CM | POA: Diagnosis not present

## 2017-02-18 DIAGNOSIS — D899 Disorder involving the immune mechanism, unspecified: Secondary | ICD-10-CM | POA: Diagnosis not present

## 2017-02-19 DIAGNOSIS — R197 Diarrhea, unspecified: Secondary | ICD-10-CM | POA: Diagnosis not present

## 2017-02-19 DIAGNOSIS — D899 Disorder involving the immune mechanism, unspecified: Secondary | ICD-10-CM | POA: Diagnosis not present

## 2017-02-19 DIAGNOSIS — Z4823 Encounter for aftercare following liver transplant: Secondary | ICD-10-CM | POA: Diagnosis not present

## 2017-02-19 DIAGNOSIS — R51 Headache: Secondary | ICD-10-CM | POA: Diagnosis not present

## 2017-02-19 DIAGNOSIS — Z944 Liver transplant status: Secondary | ICD-10-CM | POA: Diagnosis not present

## 2017-02-20 DIAGNOSIS — R51 Headache: Secondary | ICD-10-CM | POA: Diagnosis not present

## 2017-02-20 DIAGNOSIS — Z9482 Intestine transplant status: Secondary | ICD-10-CM | POA: Diagnosis not present

## 2017-02-20 DIAGNOSIS — Z944 Liver transplant status: Secondary | ICD-10-CM | POA: Diagnosis not present

## 2017-02-20 DIAGNOSIS — D899 Disorder involving the immune mechanism, unspecified: Secondary | ICD-10-CM | POA: Diagnosis not present

## 2017-02-21 DIAGNOSIS — Z9482 Intestine transplant status: Secondary | ICD-10-CM | POA: Diagnosis not present

## 2017-02-21 DIAGNOSIS — R51 Headache: Secondary | ICD-10-CM | POA: Diagnosis not present

## 2017-02-21 DIAGNOSIS — D899 Disorder involving the immune mechanism, unspecified: Secondary | ICD-10-CM | POA: Diagnosis not present

## 2017-02-21 DIAGNOSIS — Z9885 Transplanted organ removal status: Secondary | ICD-10-CM | POA: Diagnosis not present

## 2017-02-21 DIAGNOSIS — Z944 Liver transplant status: Secondary | ICD-10-CM | POA: Diagnosis not present

## 2017-02-22 DIAGNOSIS — R51 Headache: Secondary | ICD-10-CM | POA: Diagnosis not present

## 2017-02-22 DIAGNOSIS — Z9482 Intestine transplant status: Secondary | ICD-10-CM | POA: Diagnosis not present

## 2017-02-22 DIAGNOSIS — D899 Disorder involving the immune mechanism, unspecified: Secondary | ICD-10-CM | POA: Diagnosis not present

## 2017-02-22 DIAGNOSIS — Z944 Liver transplant status: Secondary | ICD-10-CM | POA: Diagnosis not present

## 2017-02-23 DIAGNOSIS — Z944 Liver transplant status: Secondary | ICD-10-CM | POA: Diagnosis not present

## 2017-02-23 DIAGNOSIS — D899 Disorder involving the immune mechanism, unspecified: Secondary | ICD-10-CM | POA: Diagnosis not present

## 2017-02-23 DIAGNOSIS — Z9482 Intestine transplant status: Secondary | ICD-10-CM | POA: Diagnosis not present

## 2017-02-23 DIAGNOSIS — R51 Headache: Secondary | ICD-10-CM | POA: Diagnosis not present

## 2017-02-23 DIAGNOSIS — R188 Other ascites: Secondary | ICD-10-CM | POA: Diagnosis not present

## 2017-02-24 DIAGNOSIS — K644 Residual hemorrhoidal skin tags: Secondary | ICD-10-CM | POA: Diagnosis not present

## 2017-02-24 DIAGNOSIS — K222 Esophageal obstruction: Secondary | ICD-10-CM | POA: Diagnosis not present

## 2017-02-24 DIAGNOSIS — Z9482 Intestine transplant status: Secondary | ICD-10-CM | POA: Diagnosis not present

## 2017-02-24 DIAGNOSIS — K5730 Diverticulosis of large intestine without perforation or abscess without bleeding: Secondary | ICD-10-CM | POA: Diagnosis not present

## 2017-02-24 DIAGNOSIS — Z980 Intestinal bypass and anastomosis status: Secondary | ICD-10-CM | POA: Diagnosis not present

## 2017-02-24 DIAGNOSIS — Z944 Liver transplant status: Secondary | ICD-10-CM | POA: Diagnosis not present

## 2017-02-24 DIAGNOSIS — R197 Diarrhea, unspecified: Secondary | ICD-10-CM | POA: Diagnosis not present

## 2017-02-24 DIAGNOSIS — D899 Disorder involving the immune mechanism, unspecified: Secondary | ICD-10-CM | POA: Diagnosis not present

## 2017-02-24 DIAGNOSIS — R945 Abnormal results of liver function studies: Secondary | ICD-10-CM | POA: Diagnosis not present

## 2017-02-24 DIAGNOSIS — Z4823 Encounter for aftercare following liver transplant: Secondary | ICD-10-CM | POA: Diagnosis not present

## 2017-02-25 DIAGNOSIS — N200 Calculus of kidney: Secondary | ICD-10-CM | POA: Diagnosis not present

## 2017-02-25 DIAGNOSIS — Z944 Liver transplant status: Secondary | ICD-10-CM | POA: Diagnosis not present

## 2017-02-25 DIAGNOSIS — R945 Abnormal results of liver function studies: Secondary | ICD-10-CM | POA: Diagnosis not present

## 2017-02-25 DIAGNOSIS — Z9482 Intestine transplant status: Secondary | ICD-10-CM | POA: Diagnosis not present

## 2017-02-25 DIAGNOSIS — R7989 Other specified abnormal findings of blood chemistry: Secondary | ICD-10-CM | POA: Diagnosis not present

## 2017-02-25 DIAGNOSIS — D899 Disorder involving the immune mechanism, unspecified: Secondary | ICD-10-CM | POA: Diagnosis not present

## 2017-02-26 DIAGNOSIS — Z9482 Intestine transplant status: Secondary | ICD-10-CM | POA: Diagnosis not present

## 2017-02-26 DIAGNOSIS — Z944 Liver transplant status: Secondary | ICD-10-CM | POA: Diagnosis not present

## 2017-02-26 DIAGNOSIS — R945 Abnormal results of liver function studies: Secondary | ICD-10-CM | POA: Diagnosis not present

## 2017-02-26 DIAGNOSIS — D899 Disorder involving the immune mechanism, unspecified: Secondary | ICD-10-CM | POA: Diagnosis not present

## 2017-02-27 DIAGNOSIS — Z944 Liver transplant status: Secondary | ICD-10-CM | POA: Diagnosis not present

## 2017-02-27 DIAGNOSIS — I517 Cardiomegaly: Secondary | ICD-10-CM | POA: Diagnosis not present

## 2017-02-27 DIAGNOSIS — D899 Disorder involving the immune mechanism, unspecified: Secondary | ICD-10-CM | POA: Diagnosis not present

## 2017-02-27 DIAGNOSIS — R945 Abnormal results of liver function studies: Secondary | ICD-10-CM | POA: Diagnosis not present

## 2017-02-27 DIAGNOSIS — Z9482 Intestine transplant status: Secondary | ICD-10-CM | POA: Diagnosis not present

## 2017-02-27 DIAGNOSIS — M79671 Pain in right foot: Secondary | ICD-10-CM | POA: Diagnosis not present

## 2017-02-28 DIAGNOSIS — D899 Disorder involving the immune mechanism, unspecified: Secondary | ICD-10-CM | POA: Diagnosis not present

## 2017-02-28 DIAGNOSIS — Z7409 Other reduced mobility: Secondary | ICD-10-CM | POA: Diagnosis not present

## 2017-02-28 DIAGNOSIS — R51 Headache: Secondary | ICD-10-CM | POA: Diagnosis not present

## 2017-02-28 DIAGNOSIS — R945 Abnormal results of liver function studies: Secondary | ICD-10-CM | POA: Diagnosis not present

## 2017-02-28 DIAGNOSIS — M79661 Pain in right lower leg: Secondary | ICD-10-CM | POA: Diagnosis not present

## 2017-03-01 DIAGNOSIS — Z9885 Transplanted organ removal status: Secondary | ICD-10-CM | POA: Diagnosis not present

## 2017-03-03 DIAGNOSIS — Z7682 Awaiting organ transplant status: Secondary | ICD-10-CM | POA: Diagnosis not present

## 2017-03-05 DIAGNOSIS — M6258 Muscle wasting and atrophy, not elsewhere classified, other site: Secondary | ICD-10-CM | POA: Diagnosis not present

## 2017-03-05 DIAGNOSIS — R1030 Lower abdominal pain, unspecified: Secondary | ICD-10-CM | POA: Diagnosis not present

## 2017-03-05 DIAGNOSIS — R10813 Right lower quadrant abdominal tenderness: Secondary | ICD-10-CM | POA: Diagnosis not present

## 2017-03-06 ENCOUNTER — Observation Stay (HOSPITAL_COMMUNITY)
Admission: EM | Admit: 2017-03-06 | Discharge: 2017-03-08 | Disposition: A | Discharge status: Home / Self Care | Payer: Medicare Other | Attending: Internal Medicine | Admitting: Internal Medicine

## 2017-03-06 ENCOUNTER — Other Ambulatory Visit: Payer: Self-pay

## 2017-03-06 ENCOUNTER — Emergency Department (HOSPITAL_COMMUNITY): Payer: Medicare Other

## 2017-03-06 ENCOUNTER — Encounter (HOSPITAL_COMMUNITY): Payer: Self-pay | Admitting: Emergency Medicine

## 2017-03-06 DIAGNOSIS — B349 Viral infection, unspecified: Secondary | ICD-10-CM | POA: Diagnosis present

## 2017-03-06 DIAGNOSIS — N179 Acute kidney failure, unspecified: Secondary | ICD-10-CM | POA: Diagnosis not present

## 2017-03-06 DIAGNOSIS — J111 Influenza due to unidentified influenza virus with other respiratory manifestations: Principal | ICD-10-CM | POA: Insufficient documentation

## 2017-03-06 DIAGNOSIS — E1129 Type 2 diabetes mellitus with other diabetic kidney complication: Secondary | ICD-10-CM | POA: Diagnosis not present

## 2017-03-06 DIAGNOSIS — R509 Fever, unspecified: Secondary | ICD-10-CM | POA: Diagnosis present

## 2017-03-06 DIAGNOSIS — F329 Major depressive disorder, single episode, unspecified: Secondary | ICD-10-CM | POA: Diagnosis not present

## 2017-03-06 DIAGNOSIS — Z9482 Intestine transplant status: Secondary | ICD-10-CM | POA: Insufficient documentation

## 2017-03-06 DIAGNOSIS — R030 Elevated blood-pressure reading, without diagnosis of hypertension: Secondary | ICD-10-CM | POA: Diagnosis present

## 2017-03-06 DIAGNOSIS — R05 Cough: Secondary | ICD-10-CM | POA: Diagnosis not present

## 2017-03-06 DIAGNOSIS — F418 Other specified anxiety disorders: Secondary | ICD-10-CM | POA: Diagnosis present

## 2017-03-06 DIAGNOSIS — D649 Anemia, unspecified: Secondary | ICD-10-CM | POA: Diagnosis present

## 2017-03-06 DIAGNOSIS — F419 Anxiety disorder, unspecified: Secondary | ICD-10-CM | POA: Diagnosis not present

## 2017-03-06 DIAGNOSIS — Z79899 Other long term (current) drug therapy: Secondary | ICD-10-CM | POA: Insufficient documentation

## 2017-03-06 DIAGNOSIS — Z7982 Long term (current) use of aspirin: Secondary | ICD-10-CM | POA: Insufficient documentation

## 2017-03-06 DIAGNOSIS — Z87891 Personal history of nicotine dependence: Secondary | ICD-10-CM | POA: Diagnosis not present

## 2017-03-06 DIAGNOSIS — R51 Headache: Secondary | ICD-10-CM | POA: Diagnosis present

## 2017-03-06 DIAGNOSIS — A419 Sepsis, unspecified organism: Secondary | ICD-10-CM | POA: Diagnosis not present

## 2017-03-06 DIAGNOSIS — G4452 New daily persistent headache (NDPH): Secondary | ICD-10-CM | POA: Insufficient documentation

## 2017-03-06 DIAGNOSIS — J208 Acute bronchitis due to other specified organisms: Secondary | ICD-10-CM

## 2017-03-06 DIAGNOSIS — R519 Headache, unspecified: Secondary | ICD-10-CM | POA: Diagnosis present

## 2017-03-06 DIAGNOSIS — Z9483 Pancreas transplant status: Secondary | ICD-10-CM

## 2017-03-06 DIAGNOSIS — Z944 Liver transplant status: Secondary | ICD-10-CM

## 2017-03-06 DIAGNOSIS — R69 Illness, unspecified: Secondary | ICD-10-CM

## 2017-03-06 LAB — CBC WITH DIFFERENTIAL/PLATELET
BAND NEUTROPHILS: 5 %
BASOS ABS: 0 10*3/uL (ref 0.0–0.1)
Basophils Relative: 0 %
Blasts: 0 %
EOS ABS: 1.3 10*3/uL — AB (ref 0.0–0.7)
Eosinophils Relative: 10 %
HCT: 28 % — ABNORMAL LOW (ref 36.0–46.0)
Hemoglobin: 8.2 g/dL — ABNORMAL LOW (ref 12.0–15.0)
Lymphocytes Relative: 2 %
Lymphs Abs: 0.3 10*3/uL — ABNORMAL LOW (ref 0.7–4.0)
MCH: 25.9 pg — ABNORMAL LOW (ref 26.0–34.0)
MCHC: 29.3 g/dL — ABNORMAL LOW (ref 30.0–36.0)
MCV: 88.3 fL (ref 78.0–100.0)
METAMYELOCYTES PCT: 0 %
MONOS PCT: 16 %
Monocytes Absolute: 2 10*3/uL — ABNORMAL HIGH (ref 0.1–1.0)
Myelocytes: 0 %
NEUTROS ABS: 8.9 10*3/uL — AB (ref 1.7–7.7)
Neutrophils Relative %: 67 %
Other: 0 %
PLATELETS: 500 10*3/uL — AB (ref 150–400)
PROMYELOCYTES ABS: 0 %
RBC: 3.17 MIL/uL — ABNORMAL LOW (ref 3.87–5.11)
RDW: 16 % — ABNORMAL HIGH (ref 11.5–15.5)
WBC: 12.5 10*3/uL — ABNORMAL HIGH (ref 4.0–10.5)
nRBC: 0 /100 WBC

## 2017-03-06 LAB — COMPREHENSIVE METABOLIC PANEL
ALK PHOS: 154 U/L — AB (ref 38–126)
ALT: 60 U/L — ABNORMAL HIGH (ref 14–54)
AST: 28 U/L (ref 15–41)
Albumin: 3 g/dL — ABNORMAL LOW (ref 3.5–5.0)
Anion gap: 10 (ref 5–15)
BUN: 39 mg/dL — AB (ref 6–20)
CALCIUM: 8.7 mg/dL — AB (ref 8.9–10.3)
CO2: 20 mmol/L — ABNORMAL LOW (ref 22–32)
Chloride: 110 mmol/L (ref 101–111)
Creatinine, Ser: 1.63 mg/dL — ABNORMAL HIGH (ref 0.44–1.00)
GFR calc Af Amer: 40 mL/min — ABNORMAL LOW (ref >60)
GFR calc non Af Amer: 34 mL/min — ABNORMAL LOW (ref >60)
GLUCOSE: 109 mg/dL — AB (ref 65–99)
Potassium: 4.8 mmol/L (ref 3.5–5.1)
Sodium: 140 mmol/L (ref 135–145)
TOTAL PROTEIN: 6.1 g/dL — AB (ref 6.5–8.1)
Total Bilirubin: 0.5 mg/dL (ref 0.3–1.2)

## 2017-03-06 LAB — INFLUENZA PANEL BY PCR (TYPE A & B)
INFLBPCR: NEGATIVE
Influenza A By PCR: NEGATIVE

## 2017-03-06 LAB — I-STAT CG4 LACTIC ACID, ED: Lactic Acid, Venous: 0.87 mmol/L (ref 0.5–1.9)

## 2017-03-06 MED ORDER — VANCOMYCIN HCL IN DEXTROSE 1-5 GM/200ML-% IV SOLN
1000.0000 mg | Freq: Once | INTRAVENOUS | Status: AC
  Administered 2017-03-07: 1000 mg via INTRAVENOUS
  Filled 2017-03-06: qty 200

## 2017-03-06 MED ORDER — SODIUM CHLORIDE 0.45 % IV BOLUS: 1000.0000 mL | Freq: Once | INTRAVENOUS | Status: DC

## 2017-03-06 MED ORDER — OXYCODONE-ACETAMINOPHEN 5-325 MG PO TABS
1.0000 | ORAL_TABLET | Freq: Once | ORAL | Status: AC
  Administered 2017-03-06: 1 via ORAL
  Filled 2017-03-06: qty 1

## 2017-03-06 MED ORDER — SODIUM CHLORIDE 0.9 % IV SOLN
1000.0000 mL | INTRAVENOUS | Status: DC
  Administered 2017-03-06: 1000 mL via INTRAVENOUS

## 2017-03-06 MED ORDER — KETOROLAC TROMETHAMINE 15 MG/ML IJ SOLN
15.0000 mg | Freq: Once | INTRAMUSCULAR | Status: AC
  Administered 2017-03-07: 15 mg via INTRAVENOUS
  Filled 2017-03-06 (×2): qty 1

## 2017-03-06 MED ORDER — LEVOFLOXACIN IN D5W 750 MG/150ML IV SOLN
750.0000 mg | Freq: Once | INTRAVENOUS | Status: AC
  Administered 2017-03-06: 750 mg via INTRAVENOUS
  Filled 2017-03-06: qty 150

## 2017-03-06 MED ORDER — METOPROLOL TARTRATE 5 MG/5ML IV SOLN
5.0000 mg | Freq: Once | INTRAVENOUS | Status: AC
  Administered 2017-03-06: 5 mg via INTRAVENOUS
  Filled 2017-03-06: qty 5

## 2017-03-06 MED ORDER — DEXTROSE 5 % IV SOLN
2.0000 g | Freq: Once | INTRAVENOUS | Status: AC
  Administered 2017-03-07: 2 g via INTRAVENOUS
  Filled 2017-03-06: qty 2

## 2017-03-06 NOTE — ED Notes (Signed)
Blood cultures x 2 in process

## 2017-03-06 NOTE — Progress Notes (Signed)
Pharmacy Note:  Initial antibiotic(s) regimen of Vancomycin, Aztreonam and Levaquin ordered by EDP to treat Sepsis.  Estimated Creatinine Clearance: 39.8 mL/min (A) (by C-G formula based on SCr of 1.63 mg/dL (H)).   Allergies  Allergen Reactions  . Ancef [Cefazolin Sodium] Other (See Comments)    Reaction:  Vaginal and mouth blisters  . Tapentadol   . Gadobenate Itching, Nausea And Vomiting and Rash  . Iodinated Diagnostic Agents Itching, Nausea And Vomiting and Rash  . Tape Rash    Vitals:   03/06/17 2153 03/06/17 2213  BP: (!) 186/98   Pulse: 89   Resp: 17   Temp:  100.2 F (37.9 C)  SpO2: 99%     Anti-infectives (From admission, onward)   Start     Dose/Rate Route Frequency Ordered Stop   03/06/17 2215  levofloxacin (LEVAQUIN) IVPB 750 mg     750 mg 100 mL/hr over 90 Minutes Intravenous  Once 03/06/17 2204     03/06/17 2215  aztreonam (AZACTAM) 2 g in dextrose 5 % 50 mL IVPB     2 g 100 mL/hr over 30 Minutes Intravenous  Once 03/06/17 2204     03/06/17 2215  vancomycin (VANCOCIN) IVPB 1000 mg/200 mL premix     1,000 mg 200 mL/hr over 60 Minutes Intravenous  Once 03/06/17 2204       Plan: Initial dose(s) of Vancomycin, Aztreonam and Levaquin X 1 ordered. F/U admission orders for further dosing if therapy continued.  Pricilla Larsson, [facility] 03/06/2017 10:22 PM

## 2017-03-06 NOTE — H&P (Signed)
History and Physical    [name]:[id number] DOB: 10/29/1960 DOA: 03/06/2017  PCP: Redmond School, MD   Patient coming from: Home.  I have personally briefly reviewed patient's old medical records in [place]  Chief Complaint: Fever, body aches, dry cough and congestion.  HPI: [name] is a 57 y.o. 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  She denies hemoptysis, wheezing, chest pain, palpitations, diaphoresis, pitting edema lower extremities, PND orthopnea.  No abdominal pain, nausea, emesis, diarrhea, constipation, melena or hematochezia.  She denies polyuria, dysuria, frequency or hematuria.  Denies polyphagia, polydipsia or polyuria.  She mentions that after her pancreas transplant, she has not needed medication to control her blood glucose.  ED Course: Initial vital signs temperature 36.9C (98.4 F), pulse 98, respirations 18, blood pressure 188/102 and O2 sat 98%.  Her workup shows a normal EKG, normal lactic acid.  White count was 12.5 with a normal differential, hemoglobin 8.2 g/dL and platelets 500.  Sodium 140, potassium 4.8, chloride 110 and CO2 20 mmol/L.  BUN 39, creatinine 1.63, calcium 8.7 and glucose 109  mg/dL.  Total protein 6.1 and albumin 3.0 g/dL.  AST was 28 and ALT was 60 U/L.  Which is improved from her most recent measurement on the 22nd at [facility] when AST was 41 and ALT 205 U/L.  Blood cultures x2 were drawn.  Her chest radiograph does not show any acute cardiopulmonary pathology.  Please see images and full radiology report for further detail.   Medications in the ED: She received aztreonam, Levaquin and vancomycin for sepsis coverage.  I added 0.45% 1000 mL 2-hour bolus and Toradol 15 mg IVP x1 dose for headache and congestion.  Review of Systems: As per HPI otherwise 10 point review of systems negative.    Past Medical History:  Diagnosis Date  . Anemia   . Anxiety   . Ascites   . Cirrhosis ([place])    non alcoholic  . Complication of anesthesia   . Depression   . Diabetes mellitus type 2, controlled, without complications ([place])   . Enteropathy 07/31/2015   Portal hypertensive enteropathy per capsule study, with stigmata of bleeding.  . Esophageal varices ([place])   . Gastric ulcer 07/15/2015   per EGD; no stigmata of bleeding  . GI bleed 07/17/2015  . Gram-negative bacteremia 12/08/2011  . XBDZHGDJ(242.6)    "monthly" (11/07/2012)  . Heart murmur   . Hepatitis C    Hx: of Hep "C" it was eradicated  . History of hepatitis C - successfuly treated medically 07/14/2015  . Kidney stones   . Liver failure ([place])    "I'm on liver transplant list @ [facility]" (11/07/2012)  . PONV (postoperative nausea and vomiting)   . Portal vein thrombosis   . Renal insufficiency   . Right ureteral stone 12/07/2011  . 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  Type II diabetes mellitus ([place])    "not since gastric bypass" (11/07/2012)    Past Surgical History:  Procedure Laterality Date  . ABDOMINAL HYSTERECTOMY  2003  . CESAREAN SECTION  1995  . CHOLECYSTECTOMY  1987  . COLONOSCOPY    . COLONOSCOPY  10/21/2011   Procedure: COLONOSCOPY;  Surgeon: Rogene Houston, MD;  [facility]  [facility];  Service: Endoscopy;  Laterality: N/A;  245   . CYSTOSCOPY W/ RETROGRADES  12/08/2011   Procedure: CYSTOSCOPY WITH RETROGRADE PYELOGRAM;  Surgeon: Marissa Nestle, MD;  Location: [facility] ORS;  Service: Urology;  Laterality: Right;  . CYSTOSCOPY WITH STENT PLACEMENT  12/08/2011   Procedure: CYSTOSCOPY WITH STENT PLACEMENT;  Surgeon: Marissa Nestle, MD;  Location: [facility] OR[facility];  Service: Urology;  Laterality: Right;  . DILATION AND CURETTAGE OF UTERUS    . ESOPHAGOGASTRODUODENOSCOPY N/A 02/19/2014   Procedure: ESOPHAGOGASTRODUODENOSCOPY (EGD);  Surgeon: Rogene Houston, MD;  Location: [facility];  Service: Endoscopy;  Laterality: N/A;  100  . ESOPHAGOGASTRODUODENOSCOPY N/A 07/15/2015   Procedure: ESOPHAGOGASTRODUODENOSCOPY (EGD);  Surgeon: Rogene Houston, MD;  [facility];  Service: Endoscopy;  Laterality: N/A;  . ESOPHAGOGASTRODUODENOSCOPY N/A 07/29/2015   Procedure: ESOPHAGOGASTRODUODENOSCOPY (EGD);  Surgeon: Rogene Houston, MD;  [facility];  Service: Endoscopy;  Laterality: N/A;  . GASTRIC BYPASS  ~ 1995  . HERNIA REPAIR  04/14/11   "one in my bellybutton" (11/07/2012)  . INGUINAL HERNIA REPAIR Right    "maybe 2" (11/07/2012)  . KNEE ARTHROSCOPY Right   . LITHOTRIPSY     "several times" (11/07/2012)  . OPEN REDUCTION INTERNAL FIXATION (ORIF) DISTAL RADIAL FRACTURE Right 11/07/2012   Procedure: OPEN REDUCTION INTERNAL FIXATION (ORIF) RIGHT DISTAL RADIAL FRACTURE;  Surgeon: [name], MD;  Location: [place];  Service: Orthopedics;  Laterality: Right;  . ORIF DISTAL RADIUS FRACTURE Right 11/07/2012  . UPPER GASTROINTESTINAL ENDOSCOPY       reports that she quit smoking about 25 years ago. Her smoking use included cigarettes. She has a 2.50 pack-year smoking history. she has never used smokeless tobacco. She reports that she does not drink alcohol or use drugs.  Allergies  Allergen Reactions  . Ancef [Cefazolin Sodium] Other (See Comments)    Reaction:   Vaginal and mouth blisters  . Tapentadol   . Gadobenate Itching, Nausea And Vomiting and Rash  . Iodinated Diagnostic Agents Itching, Nausea And Vomiting and Rash  . Tape Rash    Family History  Problem Relation Age of Onset  . Dementia Mother   . Heart disease Father   . Liver disease Father   . Hypertension Father   . Diabetes Father   . Dementia Father   . Hypertension Brother   . Other Son        Cervical dystonia    Prior to Admission medications   Medication Sig Start Date End Date Taking? Authorizing Provider  aspirin EC 81 MG tablet Take 81 mg by mouth daily.   Yes [provider]  calcium-vitamin D (OSCAL WITH D) 500-200 MG-UNIT tablet Take 2 tablets by mouth 2 (two) times daily.   Yes [provider]  cycloSPORINE (SANDIMMUNE) 25 MG capsule Take 300 mg by mouth 2 (two) times daily.   Yes [provider]  DULoxetine (CYMBALTA) 60 MG capsule Take 60 mg by mouth daily.   Yes [provider]  magnesium oxide (MAG-OX) 400 MG tablet Take 1,200 mg by mouth 2 (two) times daily.  Yes [provider]  mycophenolate (CELLCEPT) 250 MG capsule Take 1,000 mg by mouth every 12 (twelve) hours.   Yes [provider]  nortriptyline (PAMELOR) 50 MG capsule Take 50 mg by mouth at bedtime.   Yes [provider]  pantoprazole (PROTONIX) 40 MG tablet Take 1 tablet (40 mg total) by mouth 2 (two) times daily before a meal. 11/17/16  Yes Rehman, Mechele Dawley, MD  predniSONE (DELTASONE) 5 MG tablet Take 7.5 mg by mouth daily with breakfast.   Yes [provider]  prochlorperazine (COMPAZINE) 10 MG tablet Take 10 mg by mouth every 8 (eight) hours as needed for nausea or vomiting.   Yes [provider]  QUEtiapine (SEROQUEL) 50 MG tablet Take 50 mg by mouth every 8 (eight) hours.   Yes [provider]  sodium bicarbonate 650 MG tablet Take 1,300 mg by mouth 2 (two) times daily.   Yes [provider]    sulfamethoxazole-trimethoprim (BACTRIM DS,SEPTRA DS) 800-160 MG tablet Take 1 tablet by mouth every Monday, Wednesday, and Friday.   Yes [provider]    Physical Exam: Vitals:   03/06/17 2200 03/06/17 2213 03/06/17 2230 03/06/17 2300  BP: (!) 179/96  (!) 172/100 (!) 181/104  Pulse: 88  85 85  Resp: (!) 21  (!) 29 20  Temp:  100.2 F (37.9 C)    TempSrc:  Rectal    SpO2: 97%  98% 100%  Weight:      Height:        Constitutional: Looks acutely ill, but currently in NAD Eyes: PERRL, anicteric, lids and conjunctivae normal ENMT: Mucous membranes and lips are mildly dry. Posterior pharynx is mildly erythematous.  Neck: normal, supple, no masses, no thyromegaly Respiratory: Decreased breath sounds on bases, otherwise clear to auscultation bilaterally, no wheezing, no crackles. Normal respiratory effort. No accessory muscle use.  Cardiovascular: Regular rate and rhythm, soft systolic murmur, no rubs / gallops. No extremity edema. 2+ pedal pulses. No carotid bruits.  Abdomen: Positive surgical scars, soft, no tenderness, no masses palpated. No hepatosplenomegaly. Bowel sounds positive.  Musculoskeletal: no clubbing / cyanosis.  Good ROM, no contractures. Normal muscle tone.  Skin: no clinically significant rashes, lesions, ulcers on limited dermatological exam. Neurologic: CN 2-12 grossly intact. Sensation intact, DTR normal. Strength 5/5 in all 4.  Psychiatric: Normal judgment and insight. Alert and oriented x 4. Normal mood.     Labs on Admission: I have personally reviewed following labs and imaging studies  CBC: Recent Labs  Lab 03/06/17 2128  WBC 12.5*  NEUTROABS 8.9*  HGB 8.2*  HCT 28.0*  MCV 88.3  PLT 701*   Basic Metabolic Panel: Recent Labs  Lab 03/06/17 2128  NA 140  K 4.8  CL 110  CO2 20*  GLUCOSE 109*  BUN 39*  CREATININE 1.63*  CALCIUM 8.7*   GFR: Estimated Creatinine Clearance: 39.8 mL/min (A) (by C-G formula based on SCr of 1.63 mg/dL  (H)). Liver Function Tests: Recent Labs  Lab 03/06/17 2128  AST 28  ALT 60*  ALKPHOS 154*  BILITOT 0.5  PROT 6.1*  ALBUMIN 3.0*   No results for input(s): LIPASE, AMYLASE in the last 168 hours. No results for input(s): AMMONIA in the last 168 hours. Coagulation Profile: No results for input(s): INR, PROTIME in the last 168 hours. Cardiac Enzymes: No results for input(s): CKTOTAL, CKMB, CKMBINDEX, TROPONINI in the last 168 hours. BNP (last 3 results) No results for input(s): PROBNP in the last 8760 hours.  HbA1C: No results for input(s): HGBA1C in the last 72 hours. CBG: No results for input(s): GLUCAP in the last 168 hours. Lipid Profile: No results for input(s): CHOL, HDL, LDLCALC, TRIG, CHOLHDL, LDLDIRECT in the last 72 hours. Thyroid Function Tests: No results for input(s): TSH, T4TOTAL, FREET4, T3FREE, THYROIDAB in the last 72 hours. Anemia Panel: No results for input(s): VITAMINB12, FOLATE, FERRITIN, TIBC, IRON, RETICCTPCT in the last 72 hours. Urine analysis:    Component Value Date/Time   COLORURINE YELLOW 07/02/2016 0059   APPEARANCEUR HAZY (A) 07/02/2016 0059   LABSPEC 1.011 07/02/2016 0059   PHURINE 5.0 07/02/2016 0059   GLUCOSEU NEGATIVE 07/02/2016 0059   HGBUR NEGATIVE 07/02/2016 0059   BILIRUBINUR NEGATIVE 07/02/2016 0059   KETONESUR NEGATIVE 07/02/2016 0059   PROTEINUR NEGATIVE 07/02/2016 0059   UROBILINOGEN 0.2 05/28/2013 0206   NITRITE NEGATIVE 07/02/2016 0059   LEUKOCYTESUR NEGATIVE 07/02/2016 0059    Radiological Exams on Admission: Dg Chest Port 1 View  Result Date: 03/06/2017 CLINICAL DATA:  57 y/o F; cough, body aches, congestion, chills, starting today. EXAM: PORTABLE CHEST 1 VIEW COMPARISON:  06/23/2016 chest radiograph FINDINGS: Stable heart size and mediastinal contours are within normal limits. Both lungs are clear. The visualized skeletal structures are unremarkable. IMPRESSION: No active disease. Electronically Signed   By: Kristine Garbe M.D.   On: 03/06/2017 22:03    EKG: Independently reviewed. Vent. rate 91 BPM PR interval * ms QRS duration 85 ms QT/QTc 354/436 ms P-R-T axes 60 52 40 Sinus rhythm  Assessment/Plan Principal Problem:   AKI (acute kidney injury) ([place]) Secondary to decreased oral intake and febrile illness. On Friday BUN/creatinine were 31/1.16 g/dL. Continue IV hydration. Monitor intake and output. Follow-up renal function daily. Consider renal imaging if no improvement.  Active Problems:   Acute viral syndrome Influenza A and B by PCR were negative. She received broad-spectrum antibiotics for sepsis in the ED. Respiratory virus panel was ordered.  Hold antibiotics until results back.    Liver transplant recipient [facility])   History of pancreas transplant ([place]) Continue cyclosporine 300 mg p.o. twice daily. Continue mycophenolate 1000 mg every 12 hours. Continue prednisone 7.5 mg p.o. daily. Monitor LFTs, given recent transaminitis.    Depression with anxiety Continue duloxetine 60 mg p.o. daily. Continue nortriptyline 50 mg p.o. at bedtime. Continue Seroquel 50 mg p.o. every 8 hours.    Anemia Low iron and saturation radius in April 2015. Monitor hematocrit and hemoglobin.    Headache(784.0) Per patient, this is different from usual headache. Toradol 15 mg IVP x1 given. May use her Percocet as needed if it becomes too intense.    Elevated blood pressure reading Seems to be situational. Metoprolol 5 mg IVP given in the ED.     DVT prophylaxis: Lovenox SQ. Code Status: Full code. Family Communication:  Disposition Plan: Observation for IV hydration and further workup. Consults called:  Admission status: Observation/telemetry.   Reubin Milan MD [facility] Pager [phone].  If 7PM-7AM, please contact night-coverage [email or web address] Password [facility]  03/06/2017, 11:53 PM

## 2017-03-06 NOTE — ED Notes (Signed)
Dr Ortiz at bedside 

## 2017-03-06 NOTE — ED Provider Notes (Addendum)
[facility] EMERGENCY DEPARTMENT Provider Note   CSN: [id number] Arrival date & time: 03/06/17  2058     History   Chief Complaint Chief Complaint  Patient presents with  . Influenza    HPI [name] is a 57 y.o. female.  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.  The patient reports that she did very well, she has done well since discharge however recently she was admitted to [facility] for 18 days after a problem with the transplant organs, she had some severe elevation in her liver function tests however after 18 days in the hospital these findings seem to improve or resolve and she was discharged home 6 days ago.  She has been in good state of health since discharge until today when she woke up this morning with sore throat, fever, aches, coughing, headache and feeling severely fatigued.  She reports that her son had similar symptoms while she was in the hospital but that seemed to resolve about the time that she came home.  She denies any productive phlegm and denies any rashes or swelling and denies she has been taking her antirejection medications) and prednisone.  The patient called her coordinator from the transplant service who spoke with the physician at [facility] and the patient was told to come to her nearest emergency department for evaluation.  The patient does report taking her temperature at home today above 101, she has been taking Tylenol to help it but she feels like it keeps coming back.  Past Medical History:  Diagnosis Date  . Anemia   . Anxiety   . Ascites   . Cirrhosis ([place])    non alcoholic  . Complication of anesthesia   . Depression   . Diabetes  mellitus type 2, controlled, without complications ([place])   . Enteropathy 07/31/2015   Portal hypertensive enteropathy per capsule study, with stigmata of bleeding.  . Esophageal varices ([place])   . Gastric ulcer 07/15/2015   per EGD; no stigmata of bleeding  . GI bleed 07/17/2015  . Gram-negative bacteremia 12/08/2011  . VELFYBOF(751.0)    "monthly" (11/07/2012)  . Heart murmur   . Hepatitis C    Hx: of Hep "C" it was eradicated  . History of hepatitis C - successfuly treated medically 07/14/2015  . Kidney stones   . Liver failure ([place])    "I'm on liver transplant list @ [facility]" (11/07/2012)  . PONV (postoperative nausea and vomiting)   . Portal vein thrombosis   . Renal insufficiency   . Right ureteral stone 12/07/2011  . 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 Type II diabetes mellitus ([place])    "not since gastric bypass" (11/07/2012)    Patient Active Problem List   Diagnosis Date Noted  . Pancytopenia ([place]) 11/13/2015  . Cirrhosis of liver with ascites ([place])   . Abdominal pain 11/10/2015  . Alcoholic cirrhosis of liver with ascites ([place])   . Enteropathy 07/31/2015  . Anemia, blood loss   . Depression with anxiety 07/30/2015  . Esophageal varices in cirrhosis ([place]) 07/30/2015  . Gastritis determined by endoscopy 07/30/2015  . Hypotension 07/30/2015  . Hyponatremia 07/29/2015  . Hypokalemia 07/29/2015  . GI bleeding 07/29/2015  .  Gastric ulcer 07/29/2015  . Leukopenia 07/29/2015  . AKI (acute kidney injury) ([place]) 07/29/2015  . Diabetes mellitus type 2, controlled, without complications ([place]) 79/03/4095  . GI bleed 07/17/2015  . Palliative care encounter   . Goals of care, counseling/discussion   . DNR (do not resuscitate) discussion   . 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 Melena 07/14/2015  . Hepatic cirrhosis due to chronic hepatitis C infection ([place]) 07/14/2015  . History of hepatitis C - successfuly treated medically 07/14/2015  . Portal vein  thrombosis 05/13/2014  . Other malaise and fatigue 08/05/2013  . Hypoxemia 08/05/2013  . Abdominal pain, right upper quadrant 05/29/2013  . SBP (spontaneous bacterial peritonitis) ([place]) 05/28/2013  . Distal radius fracture 10/25/2012  . Metatarsal bone fracture 10/25/2012  . Insomnia 09/25/2012  . Hepatic encephalopathy ([place]) 06/07/2012  . Hyperglycemia 06/06/2012  . LUQ abdominal pain 06/05/2012  . Ascites 05/14/2012  . Gram-negative bacteremia 12/08/2011  . Pyelonephritis 12/07/2011  . Hydronephrosis of right kidney 12/07/2011  . Right ureteral stone 12/07/2011  . Thrombocytopenia ([place]) 12/07/2011  . Cirrhosis of liver ([place]) 05/02/2011  . Acute blood loss anemia 05/02/2011    Past Surgical History:  Procedure Laterality Date  . ABDOMINAL HYSTERECTOMY  2003  . CESAREAN SECTION  1995  . CHOLECYSTECTOMY  1987  . COLONOSCOPY    . COLONOSCOPY  10/21/2011   Procedure: COLONOSCOPY;  Surgeon: Rogene Houston, MD;  Location: [facility] ENDO [facility];  Service: Endoscopy;  Laterality: N/A;  245   . CYSTOSCOPY W/ RETROGRADES  12/08/2011   Procedure: CYSTOSCOPY WITH RETROGRADE PYELOGRAM;  Surgeon: Marissa Nestle, MD;  Location: [facility] ORS;  Service: Urology;  Laterality: Right;  . CYSTOSCOPY WITH STENT PLACEMENT  12/08/2011   Procedure: CYSTOSCOPY WITH STENT PLACEMENT;  Surgeon: Marissa Nestle, MD;  Location: [facility] ORS;  Service: Urology;  Laterality: Right;  . DILATION AND CURETTAGE OF UTERUS    . ESOPHAGOGASTRODUODENOSCOPY N/A 02/19/2014   Procedure: ESOPHAGOGASTRODUODENOSCOPY (EGD);  Surgeon: Rogene Houston, MD;  Location: [facility] END[facility];  Service: Endoscopy;  Laterality: N/A;  100  . ESOPHAGOGASTRODUODENOSCOPY N/A 07/15/2015   Procedure: ESOPHAGOGASTRODUODENOSCOPY (EGD);  Surgeon: Rogene Houston, MD;  Location: [facility];  Service: Endoscopy;  Laterality: N/A;  . ESOPHAGOGASTRODUODENOSCOPY N/A 07/29/2015   Procedure: ESOPHAGOGASTRODUODENOSCOPY (EGD);  Surgeon: Rogene Houston, MD;  Location: [facility]  ENDO SUITE;  Service: Endoscopy;  Laterality: N/A;  . GASTRIC BYPASS  ~ 1995  . HERNIA REPAIR  04/14/11   "one in my bellybutton" (11/07/2012)  . INGUINAL HERNIA REPAIR Right    "maybe 2" (11/07/2012)  . KNEE ARTHROSCOPY Right   . LITHOTRIPSY     "several times" (11/07/2012)  . OPEN REDUCTION INTERNAL FIXATION (ORIF) DISTAL RADIAL FRACTURE Right 11/07/2012   Procedure: OPEN REDUCTION INTERNAL FIXATION (ORIF) RIGHT DISTAL RADIAL FRACTURE;  Surgeon: [name], MD;  Location: [facility];  Service: Orthopedics;  Laterality: Right;  . ORIF DISTAL RADIUS FRACTURE Right 11/07/2012  . UPPER GASTROINTESTINAL ENDOSCOPY      OB History    Gravida Para Term Preterm AB Living   1 1 1          SAB TAB Ectopic Multiple Live Births                   Home Medications    Prior to Admission medications   Medication Sig Start Date End Date Taking? Authorizing Provider  aspirin EC 81 MG tablet Take 81 mg by mouth daily.  Yes [provider]  calcium-vitamin D (OSCAL WITH D) 500-200 MG-UNIT tablet Take 2 tablets by mouth 2 (two) times daily.   Yes [provider]  cycloSPORINE (SANDIMMUNE) 25 MG capsule Take 300 mg by mouth 2 (two) times daily.   Yes [provider]  DULoxetine (CYMBALTA) 60 MG capsule Take 60 mg by mouth daily.   Yes [provider]  magnesium oxide (MAG-OX) 400 MG tablet Take 1,200 mg by mouth 2 (two) times daily.   Yes [provider]  mycophenolate (CELLCEPT) 250 MG capsule Take 1,000 mg by mouth every 12 (twelve) hours.   Yes [provider]  nortriptyline (PAMELOR) 50 MG capsule Take 50 mg by mouth at bedtime.   Yes [provider]  pantoprazole (PROTONIX) 40 MG tablet Take 1 tablet (40 mg total) by mouth 2 (two) times daily before a meal. 11/17/16  Yes Rehman, Mechele Dawley, MD  predniSONE (DELTASONE) 5 MG tablet Take 7.5 mg by mouth daily with breakfast.   Yes [provider]  prochlorperazine (COMPAZINE) 10 MG  tablet Take 10 mg by mouth every 8 (eight) hours as needed for nausea or vomiting.   Yes [provider]  QUEtiapine (SEROQUEL) 50 MG tablet Take 50 mg by mouth every 8 (eight) hours.   Yes [provider]  sodium bicarbonate 650 MG tablet Take 1,300 mg by mouth 2 (two) times daily.   Yes [provider]  sulfamethoxazole-trimethoprim (BACTRIM DS,SEPTRA DS) 800-160 MG tablet Take 1 tablet by mouth every Monday, Wednesday, and Friday.   Yes [provider]    Family History Family History  Problem Relation Age of Onset  . Dementia Mother   . Heart disease Father   . Liver disease Father   . Hypertension Father   . Diabetes Father   . Dementia Father   . Hypertension Brother   . Other Son        Cervical dystonia    Social History Social History   Tobacco Use  . Smoking status: Former Smoker    Packs/day: 0.50    Years: 5.00    Pack years: 2.50    Types: Cigarettes    Last attempt to quit: 05/02/1991    Years since quitting: 25.8  . Smokeless tobacco: Never Used  Substance Use Topics  . Alcohol use: No    Alcohol/week: 0.0 oz  . Drug use: No     Allergies   Ancef [cefazolin sodium]; Tapentadol; Gadobenate; Iodinated diagnostic agents; and Tape   Review of Systems Review of Systems  All other systems reviewed and are negative.    Physical Exam Updated Vital Signs BP (!) 186/98   Pulse 89   Temp 98.4 F (36.9 C)   Resp 17   Ht 5\' 6"  (1.676 m)   Wt 72.6 kg (160 lb)   SpO2 99%   BMI 25.82 kg/m   Physical Exam  Constitutional: She appears well-developed and well-nourished. No distress.  HENT:  Head: Normocephalic and atraumatic.  Mouth/Throat: No oropharyngeal exudate.  Mild erythema of the posterior pharynx, without exudate asymmetry hypertrophy or uvular deviation.  Bilateral tympanic membranes are clear.  Nasal passages are clear.  No trismus or torticollis  Eyes: Conjunctivae and EOM are normal. Pupils are equal,  round, and reactive to light. Right eye exhibits no discharge. Left eye exhibits no discharge. No scleral icterus.  No jaundice  Neck: Normal range of motion. Neck supple. No JVD present. No thyromegaly present.  No lymphadenopathy of  the neck  Cardiovascular: Regular rhythm, normal heart sounds and intact distal pulses. Exam reveals no gallop and no friction rub.  No murmur heard. Pulse of 100 on my exam with normal radial artery pulses, no peripheral edema  Pulmonary/Chest: Effort normal and breath sounds normal. No respiratory distress. She has no wheezes. She has no rales.  Mildly tachypneic, no abnormal lung sounds, speaks in full sentences, no increased work of breathing  Abdominal: Soft. Bowel sounds are normal. She exhibits no distension and no mass. There is no tenderness.  The patient is warm to the touch, she has well-healed scars across her abdominal wall  Musculoskeletal: Normal range of motion. She exhibits no edema or tenderness.  There is no edema of the lower extremities  Lymphadenopathy:    She has no cervical adenopathy.  Neurological: She is alert. Coordination normal.  Patient is totally oriented and able to follow all my commands without any difficulties.  Skin: Skin is warm and dry. No rash noted. No erythema.  The patient seems hot to the touch however there is no signs of rashes, no petechiae purpura or urticaria  Psychiatric: She has a normal mood and affect. Her behavior is normal.  Nursing note and vitals reviewed.    ED Treatments / Results  Labs (all labs ordered are listed, but only abnormal results are displayed) Labs Reviewed  CULTURE, BLOOD (ROUTINE X 2)  CULTURE, BLOOD (ROUTINE X 2)  COMPREHENSIVE METABOLIC PANEL  CBC WITH DIFFERENTIAL/PLATELET  URINALYSIS, ROUTINE W REFLEX MICROSCOPIC  INFLUENZA PANEL BY PCR (TYPE A & B)  I-STAT CG4 LACTIC ACID, ED    EKG  EKG Interpretation  Date/Time:  Monday March 06 2017 22:06:04 EST Ventricular Rate:   91 PR Interval:    QRS Duration: 85 QT Interval:  354 QTC Calculation: 436 R Axis:   52 Text Interpretation:  Sinus rhythm Since last tracing T wave abnormality NO LONGER PRESENT Normal ECG Confirmed by Noemi Chapel [phone]) on 03/06/2017 10:15:55 PM       Radiology No results found.  Procedures .Critical Care Performed by: Noemi Chapel, MD Authorized by: Noemi Chapel, MD   Critical care provider statement:    Critical care time (minutes):  35   Critical care time was exclusive of:  Separately billable procedures and treating other patients and teaching time   Critical care was necessary to treat or prevent imminent or life-threatening deterioration of the following conditions:  Sepsis   Critical care was time spent personally by me on the following activities:  Blood draw for specimens, development of treatment plan with patient or surrogate, discussions with consultants, evaluation of patient's response to treatment, examination of patient, obtaining history from patient or surrogate, ordering and performing treatments and interventions, ordering and review of laboratory studies, ordering and review of radiographic studies, pulse oximetry, re-evaluation of patient's condition and review of old charts   (including critical care time)  Medications Ordered in ED Medications  0.9 %  sodium chloride infusion (1,000 mLs Intravenous New Bag/Given 03/06/17 2157)  levofloxacin (LEVAQUIN) IVPB 750 mg (not administered)  aztreonam (AZACTAM) 2 g in dextrose 5 % 50 mL IVPB (not administered)  vancomycin (VANCOCIN) IVPB 1000 mg/200 mL premix (not administered)     Initial Impression / Assessment and Plan / ED Course  I have reviewed the triage vital signs and the nursing notes.  Pertinent labs & imaging results that were available during my care of the patient were reviewed by me and considered in my medical  decision making (see chart for details).   At this time the patient appears to  have an acute infection, it is unclear whether this is influenza or something more serious.  The patient is definitely immunocompromised and with her severe fatigue and palpable fever with her measured fever today I am concerned that she could be septic.  Chest x-ray, lab work including lactic acid, blood cultures, blood counts, metabolic panel, urinalysis ordered.  IV fluids ordered.  Influenza ordered.  D/w Dr. Olevia Bowens for admission pending labs - he is agreeable unless organ dysfunction. Antibiotics ordered Lactic acid normal  Final Clinical Impressions(s) / ED Diagnoses   Final diagnoses:  Sepsis, due to unspecified organism [facility])      Noemi Chapel, MD 03/06/17 1914    Noemi Chapel, MD 03/06/17 2216

## 2017-03-06 NOTE — ED Notes (Signed)
Okay for pt to take anti-rejection medications brought from home per Dr Rogene Houston

## 2017-03-06 NOTE — ED Triage Notes (Signed)
Patient reports body aches, congestion, chills and non productive cough that started today.

## 2017-03-07 ENCOUNTER — Encounter (HOSPITAL_COMMUNITY): Payer: Self-pay | Admitting: *Deleted

## 2017-03-07 DIAGNOSIS — B349 Viral infection, unspecified: Secondary | ICD-10-CM | POA: Diagnosis not present

## 2017-03-07 DIAGNOSIS — E1129 Type 2 diabetes mellitus with other diabetic kidney complication: Secondary | ICD-10-CM | POA: Diagnosis not present

## 2017-03-07 DIAGNOSIS — F418 Other specified anxiety disorders: Secondary | ICD-10-CM

## 2017-03-07 DIAGNOSIS — D649 Anemia, unspecified: Secondary | ICD-10-CM | POA: Diagnosis not present

## 2017-03-07 DIAGNOSIS — Z87891 Personal history of nicotine dependence: Secondary | ICD-10-CM | POA: Diagnosis not present

## 2017-03-07 DIAGNOSIS — F329 Major depressive disorder, single episode, unspecified: Secondary | ICD-10-CM | POA: Diagnosis not present

## 2017-03-07 DIAGNOSIS — Z9482 Intestine transplant status: Secondary | ICD-10-CM | POA: Diagnosis not present

## 2017-03-07 DIAGNOSIS — Z79899 Other long term (current) drug therapy: Secondary | ICD-10-CM | POA: Diagnosis not present

## 2017-03-07 DIAGNOSIS — J111 Influenza due to unidentified influenza virus with other respiratory manifestations: Secondary | ICD-10-CM | POA: Diagnosis not present

## 2017-03-07 DIAGNOSIS — Z944 Liver transplant status: Secondary | ICD-10-CM | POA: Diagnosis not present

## 2017-03-07 DIAGNOSIS — N179 Acute kidney failure, unspecified: Secondary | ICD-10-CM | POA: Diagnosis not present

## 2017-03-07 DIAGNOSIS — Z7982 Long term (current) use of aspirin: Secondary | ICD-10-CM | POA: Diagnosis not present

## 2017-03-07 DIAGNOSIS — Z9483 Pancreas transplant status: Secondary | ICD-10-CM | POA: Diagnosis not present

## 2017-03-07 DIAGNOSIS — G4452 New daily persistent headache (NDPH): Secondary | ICD-10-CM | POA: Diagnosis not present

## 2017-03-07 DIAGNOSIS — F419 Anxiety disorder, unspecified: Secondary | ICD-10-CM | POA: Diagnosis not present

## 2017-03-07 LAB — URINALYSIS, ROUTINE W REFLEX MICROSCOPIC
Bilirubin Urine: NEGATIVE
GLUCOSE, UA: NEGATIVE mg/dL
HGB URINE DIPSTICK: NEGATIVE
Ketones, ur: NEGATIVE mg/dL
LEUKOCYTES UA: NEGATIVE
Nitrite: NEGATIVE
Protein, ur: NEGATIVE mg/dL
SPECIFIC GRAVITY, URINE: 1.005 (ref 1.005–1.030)
pH: 6 (ref 5.0–8.0)

## 2017-03-07 LAB — CBC WITH DIFFERENTIAL/PLATELET
BASOS PCT: 1 %
Band Neutrophils: 5 %
Basophils Absolute: 0.1 10*3/uL (ref 0.0–0.1)
Blasts: 0 %
Eosinophils Absolute: 0.8 10*3/uL — ABNORMAL HIGH (ref 0.0–0.7)
Eosinophils Relative: 7 %
HEMATOCRIT: 27 % — AB (ref 36.0–46.0)
HEMOGLOBIN: 8 g/dL — AB (ref 12.0–15.0)
Lymphocytes Relative: 6 %
Lymphs Abs: 0.6 10*3/uL — ABNORMAL LOW (ref 0.7–4.0)
MCH: 25.8 pg — ABNORMAL LOW (ref 26.0–34.0)
MCHC: 29.6 g/dL — AB (ref 30.0–36.0)
MCV: 87.1 fL (ref 78.0–100.0)
MONO ABS: 0.8 10*3/uL (ref 0.1–1.0)
MYELOCYTES: 0 %
Metamyelocytes Relative: 0 %
Monocytes Relative: 7 %
NEUTROS PCT: 74 %
NRBC: 0 /100{WBCs}
Neutro Abs: 8.5 10*3/uL — ABNORMAL HIGH (ref 1.7–7.7)
Other: 0 %
PROMYELOCYTES ABS: 0 %
Platelets: 485 10*3/uL — ABNORMAL HIGH (ref 150–400)
RBC: 3.1 MIL/uL — ABNORMAL LOW (ref 3.87–5.11)
RDW: 16.2 % — ABNORMAL HIGH (ref 11.5–15.5)
WBC: 10.8 10*3/uL — AB (ref 4.0–10.5)

## 2017-03-07 LAB — RESPIRATORY PANEL BY PCR
Adenovirus: NOT DETECTED
BORDETELLA PERTUSSIS-RVPCR: NOT DETECTED
CHLAMYDOPHILA PNEUMONIAE-RVPPCR: NOT DETECTED
Coronavirus 229E: NOT DETECTED
Coronavirus HKU1: NOT DETECTED
Coronavirus NL63: NOT DETECTED
Coronavirus OC43: DETECTED — AB
INFLUENZA A-RVPPCR: NOT DETECTED
INFLUENZA B-RVPPCR: NOT DETECTED
Metapneumovirus: NOT DETECTED
Mycoplasma pneumoniae: NOT DETECTED
PARAINFLUENZA VIRUS 3-RVPPCR: NOT DETECTED
PARAINFLUENZA VIRUS 4-RVPPCR: NOT DETECTED
Parainfluenza Virus 1: NOT DETECTED
Parainfluenza Virus 2: NOT DETECTED
RESPIRATORY SYNCYTIAL VIRUS-RVPPCR: NOT DETECTED
RHINOVIRUS / ENTEROVIRUS - RVPPCR: NOT DETECTED

## 2017-03-07 LAB — COMPREHENSIVE METABOLIC PANEL
ALBUMIN: 2.9 g/dL — AB (ref 3.5–5.0)
ALK PHOS: 139 U/L — AB (ref 38–126)
ALT: 54 U/L (ref 14–54)
ANION GAP: 9 (ref 5–15)
AST: 29 U/L (ref 15–41)
BILIRUBIN TOTAL: 0.6 mg/dL (ref 0.3–1.2)
BUN: 35 mg/dL — AB (ref 6–20)
CALCIUM: 8.6 mg/dL — AB (ref 8.9–10.3)
CO2: 22 mmol/L (ref 22–32)
Chloride: 108 mmol/L (ref 101–111)
Creatinine, Ser: 1.37 mg/dL — ABNORMAL HIGH (ref 0.44–1.00)
GFR calc Af Amer: 49 mL/min — ABNORMAL LOW (ref >60)
GFR calc non Af Amer: 43 mL/min — ABNORMAL LOW (ref >60)
GLUCOSE: 102 mg/dL — AB (ref 65–99)
Potassium: 4.4 mmol/L (ref 3.5–5.1)
Sodium: 139 mmol/L (ref 135–145)
TOTAL PROTEIN: 5.7 g/dL — AB (ref 6.5–8.1)

## 2017-03-07 MED ORDER — CYCLOSPORINE MODIFIED (NEORAL) 25 MG PO CAPS
300.0000 mg | ORAL_CAPSULE | Freq: Two times a day (BID) | ORAL | Status: DC
  Administered 2017-03-07 – 2017-03-08 (×3): 300 mg via ORAL
  Filled 2017-03-07 (×2): qty 12

## 2017-03-07 MED ORDER — VANCOMYCIN HCL IN DEXTROSE 750-5 MG/150ML-% IV SOLN
750.0000 mg | Freq: Two times a day (BID) | INTRAVENOUS | Status: DC
  Administered 2017-03-07: 750 mg via INTRAVENOUS
  Filled 2017-03-07 (×3): qty 150

## 2017-03-07 MED ORDER — LEVOFLOXACIN IN D5W 750 MG/150ML IV SOLN
750.0000 mg | INTRAVENOUS | Status: DC
Start: 2017-03-07 — End: 2017-03-08
  Administered 2017-03-07: 750 mg via INTRAVENOUS
  Filled 2017-03-07: qty 150

## 2017-03-07 MED ORDER — PREDNISONE 5 MG PO TABS
7.5000 mg | ORAL_TABLET | Freq: Every day | ORAL | Status: DC
  Administered 2017-03-07 – 2017-03-08 (×2): 7.5 mg via ORAL
  Filled 2017-03-07 (×2): qty 2

## 2017-03-07 MED ORDER — QUETIAPINE FUMARATE 25 MG PO TABS
50.0000 mg | ORAL_TABLET | Freq: Three times a day (TID) | ORAL | Status: DC
  Administered 2017-03-07 – 2017-03-08 (×6): 50 mg via ORAL
  Filled 2017-03-07 (×6): qty 2

## 2017-03-07 MED ORDER — ASPIRIN EC 81 MG PO TBEC
81.0000 mg | DELAYED_RELEASE_TABLET | Freq: Every day | ORAL | Status: DC
  Administered 2017-03-07 – 2017-03-08 (×2): 81 mg via ORAL
  Filled 2017-03-07 (×2): qty 1

## 2017-03-07 MED ORDER — DEXTROSE 5 % IV SOLN
1.0000 g | Freq: Three times a day (TID) | INTRAVENOUS | Status: DC
  Administered 2017-03-07 (×2): 1 g via INTRAVENOUS
  Filled 2017-03-07 (×5): qty 1

## 2017-03-07 MED ORDER — MAGNESIUM OXIDE 400 MG PO TABS: 1200.0000 mg | ORAL_TABLET | Freq: Two times a day (BID) | ORAL | Status: DC

## 2017-03-07 MED ORDER — SODIUM BICARBONATE 650 MG PO TABS
1300.0000 mg | ORAL_TABLET | Freq: Two times a day (BID) | ORAL | Status: DC
  Administered 2017-03-07 – 2017-03-08 (×4): 1300 mg via ORAL
  Filled 2017-03-07 (×4): qty 2

## 2017-03-07 MED ORDER — MAGNESIUM OXIDE 400 (241.3 MG) MG PO TABS
1200.0000 mg | ORAL_TABLET | Freq: Two times a day (BID) | ORAL | Status: DC
  Administered 2017-03-07 – 2017-03-08 (×3): 1200 mg via ORAL
  Filled 2017-03-07 (×6): qty 3

## 2017-03-07 MED ORDER — GUAIFENESIN ER 600 MG PO TB12
1200.0000 mg | ORAL_TABLET | Freq: Two times a day (BID) | ORAL | Status: DC
  Administered 2017-03-07 – 2017-03-08 (×2): 1200 mg via ORAL
  Filled 2017-03-07 (×2): qty 2

## 2017-03-07 MED ORDER — NORTRIPTYLINE HCL 25 MG PO CAPS
50.0000 mg | ORAL_CAPSULE | Freq: Every day | ORAL | Status: DC
  Administered 2017-03-07 (×2): 50 mg via ORAL
  Filled 2017-03-07 (×2): qty 2

## 2017-03-07 MED ORDER — MYCOPHENOLATE MOFETIL 250 MG PO CAPS
1000.0000 mg | ORAL_CAPSULE | Freq: Two times a day (BID) | ORAL | Status: DC
  Administered 2017-03-07 – 2017-03-08 (×3): 1000 mg via ORAL
  Filled 2017-03-07 (×3): qty 4

## 2017-03-07 MED ORDER — IPRATROPIUM-ALBUTEROL 0.5-2.5 (3) MG/3ML IN SOLN
3.0000 mL | Freq: Four times a day (QID) | RESPIRATORY_TRACT | Status: DC
  Administered 2017-03-07 – 2017-03-08 (×3): 3 mL via RESPIRATORY_TRACT
  Filled 2017-03-07 (×3): qty 3

## 2017-03-07 MED ORDER — LORATADINE 10 MG PO TABS
10.0000 mg | ORAL_TABLET | Freq: Every day | ORAL | Status: DC
  Administered 2017-03-07 – 2017-03-08 (×2): 10 mg via ORAL
  Filled 2017-03-07 (×2): qty 1

## 2017-03-07 MED ORDER — CYCLOSPORINE 100 MG PO CAPS
300.0000 mg | ORAL_CAPSULE | Freq: Two times a day (BID) | ORAL | Status: DC
  Filled 2017-03-07 (×4): qty 3

## 2017-03-07 MED ORDER — PROCHLORPERAZINE MALEATE 5 MG PO TABS: 10.0000 mg | ORAL_TABLET | Freq: Three times a day (TID) | ORAL | Status: DC | PRN

## 2017-03-07 MED ORDER — DULOXETINE HCL 60 MG PO CPEP
60.0000 mg | ORAL_CAPSULE | Freq: Every day | ORAL | Status: DC
  Administered 2017-03-07 – 2017-03-08 (×2): 60 mg via ORAL
  Filled 2017-03-07 (×2): qty 1

## 2017-03-07 MED ORDER — ENOXAPARIN SODIUM 40 MG/0.4ML ~~LOC~~ SOLN
40.0000 mg | SUBCUTANEOUS | Status: DC
  Filled 2017-03-07 (×2): qty 0.4

## 2017-03-07 MED ORDER — ACETAMINOPHEN 325 MG PO TABS
650.0000 mg | ORAL_TABLET | Freq: Four times a day (QID) | ORAL | Status: DC | PRN
  Administered 2017-03-07 – 2017-03-08 (×4): 650 mg via ORAL
  Filled 2017-03-07 (×4): qty 2

## 2017-03-07 MED ORDER — PANTOPRAZOLE SODIUM 40 MG PO TBEC
40.0000 mg | DELAYED_RELEASE_TABLET | Freq: Two times a day (BID) | ORAL | Status: DC
  Administered 2017-03-07 – 2017-03-08 (×3): 40 mg via ORAL
  Filled 2017-03-07 (×3): qty 1

## 2017-03-07 MED ORDER — CALCIUM CARBONATE-VITAMIN D 500-200 MG-UNIT PO TABS
2.0000 | ORAL_TABLET | Freq: Two times a day (BID) | ORAL | Status: DC
  Administered 2017-03-07 – 2017-03-08 (×4): 2 via ORAL
  Filled 2017-03-07 (×4): qty 2

## 2017-03-07 MED ORDER — SODIUM CHLORIDE 0.45 % IV SOLN
INTRAVENOUS | Status: DC
  Administered 2017-03-07 – 2017-03-08 (×4): via INTRAVENOUS

## 2017-03-07 MED ORDER — HYDRALAZINE HCL 20 MG/ML IJ SOLN: 10.0000 mg | INTRAMUSCULAR | Status: DC | PRN

## 2017-03-07 MED ORDER — SULFAMETHOXAZOLE-TRIMETHOPRIM 800-160 MG PO TABS
1.0000 | ORAL_TABLET | ORAL | Status: DC
  Administered 2017-03-08: 1 via ORAL
  Filled 2017-03-07: qty 1

## 2017-03-07 MED ORDER — OXYMETAZOLINE HCL 0.05 % NA SOLN
1.0000 | Freq: Two times a day (BID) | NASAL | Status: DC
Start: 2017-03-07 — End: 2017-03-08
  Administered 2017-03-07 – 2017-03-08 (×2): 1 via NASAL
  Filled 2017-03-07 (×2): qty 15

## 2017-03-07 NOTE — Progress Notes (Signed)
PROGRESS NOTE    [name]  ZLD:[id number] DOB: [birth year] DOA: 03/06/2017 PCP: Redmond School, MD    Brief Narrative:  57 year old female who underwent liver/pancreas transplant last year and is currently on immunosuppressants, presented to the hospital with nasal congestion, headache, cough and low-grade fever.  Found to have probable upper respiratory tract infection.  She was also noted to be dehydrated and had acute kidney injury.  Due to her fever and immunocompromise state, blood cultures were sent.  She was started on antibiotics.  If culture results are negative, creatinine continues to improve she is feeling better, anticipate discharge in the next 24 hours.   Assessment & Plan:   Principal Problem:   AKI (acute kidney injury) ([place]) Active Problems:   Depression with anxiety   Anemia   Headache(784.0)   Elevated blood pressure reading   Liver transplant recipient [facility])   History of pancreas transplant ([place])   Acute viral syndrome   1. Acute kidney injury.  Secondary to decreased oral intake.  Creatinine improving with hydration.  Continue current treatment. 2. Acute viral syndrome.  Influenza panel is negative.  Started on broad-spectrum antibiotics for bronchitis.  She is mildly febrile.  Continue to monitor. 3. Fever.  Suspect this is related to bronchitis/upper respiratory infection.  Blood cultures have been sent.  She is currently on Levaquin. 4. History of liver/pancreas transplant.  Continue on immunosuppressants including cyclosporine, mycophenolate, prednisone.  Liver enzymes are normal.  Case was reviewed with her transplant coordinator at [facility]. 5. Depression with anxiety.  Continue on Cymbalta, nortriptyline and Seroquel 6. Anemia.  Hemoglobin is currently at baseline.  No evidence of bleeding. 7. Headache.  Suspect this is related to respiratory infection/sinusitis.   DVT prophylaxis: Lovenox Code Status: Full code Family Communication: No  family present Disposition Plan: Discharge home in improved   Consultants:     Procedures:     Antimicrobials:   Vancomycin 1/28 > 1/29  Aztreonam 1/28 > 1/29  Levofloxacin 1/28 >   Subjective: Continues to complain of nasal congestion, frontal headache, cough  Objective: Vitals:   03/06/17 2230 03/06/17 2300 03/06/17 2353 03/07/17 0530  BP: (!) 172/100 (!) 181/104 (!) 172/89 (!) 178/91  Pulse: 85 85 84 76  Resp: (!) 29 20 16 18   Temp:   98.3 F (36.8 C) 98.2 F (36.8 C)  TempSrc:   Oral Oral  SpO2: 98% 100% 98% 96%  Weight:    87.2 kg (192 lb 3.2 oz)  Height:    5\' 6"  (1.676 m)    Intake/Output Summary (Last 24 hours) at 03/07/2017 1859 Last data filed at 03/07/2017 1806 Gross per 24 hour  Intake 1342.08 ml  Output 920 ml  Net 422.08 ml   Filed Weights   03/06/17 2105 03/07/17 0530  Weight: 72.6 kg (160 lb) 87.2 kg (192 lb 3.2 oz)    Examination:  General exam: Appears calm and comfortable, frontal sinus tenderness Respiratory system: Lateral rhonchi. Respiratory effort normal. Cardiovascular system: S1 & S2 heard, RRR. No JVD, murmurs, rubs, gallops or clicks. No pedal edema. Gastrointestinal system: Abdomen is nondistended, soft and nontender. No organomegaly or masses felt. Normal bowel sounds heard. Central nervous system: Alert and oriented. No focal neurological deficits. Extremities: Symmetric 5 x 5 power. Skin: No rashes, lesions or ulcers Psychiatry: Judgement and insight appear normal. Mood & affect appropriate.     Data Reviewed: I have personally reviewed following labs and imaging studies  CBC: Recent Labs  Lab 03/06/17  2128 03/07/17 0427  WBC 12.5* 10.8*  NEUTROABS 8.9* 8.5*  HGB 8.2* 8.0*  HCT 28.0* 27.0*  MCV 88.3 87.1  PLT 500* 893*   Basic Metabolic Panel: Recent Labs  Lab 03/06/17 2128 03/07/17 0427  NA 140 139  K 4.8 4.4  CL 110 108  CO2 20* 22  GLUCOSE 109* 102*  BUN 39* 35*  CREATININE 1.63* 1.37*  CALCIUM  8.7* 8.6*   GFR: Estimated Creatinine Clearance: 51.6 mL/min (A) (by C-G formula based on SCr of 1.37 mg/dL (H)). Liver Function Tests: Recent Labs  Lab 03/06/17 2128 03/07/17 0427  AST 28 29  ALT 60* 54  ALKPHOS 154* 139*  BILITOT 0.5 0.6  PROT 6.1* 5.7*  ALBUMIN 3.0* 2.9*   No results for input(s): LIPASE, AMYLASE in the last 168 hours. No results for input(s): AMMONIA in the last 168 hours. Coagulation Profile: No results for input(s): INR, PROTIME in the last 168 hours. Cardiac Enzymes: No results for input(s): CKTOTAL, CKMB, CKMBINDEX, TROPONINI in the last 168 hours. BNP (last 3 results) No results for input(s): PROBNP in the last 8760 hours. HbA1C: No results for input(s): HGBA1C in the last 72 hours. CBG: No results for input(s): GLUCAP in the last 168 hours. Lipid Profile: No results for input(s): CHOL, HDL, LDLCALC, TRIG, CHOLHDL, LDLDIRECT in the last 72 hours. Thyroid Function Tests: No results for input(s): TSH, T4TOTAL, FREET4, T3FREE, THYROIDAB in the last 72 hours. Anemia Panel: No results for input(s): VITAMINB12, FOLATE, FERRITIN, TIBC, IRON, RETICCTPCT in the last 72 hours. Sepsis Labs: Recent Labs  Lab 03/06/17 2156  LATICACIDVEN 0.87    Recent Results (from the past 240 hour(s))  Blood Culture (routine x 2)     Status: None (Preliminary result)   Collection Time: 03/06/17  9:28 PM  Result Value Ref Range Status   Specimen Description LEFT ANTECUBITAL  Final   Special Requests   Final    BOTTLES DRAWN AEROBIC AND ANAEROBIC Blood Culture adequate volume   Culture NO GROWTH < 12 HOURS  Final   Report Status PENDING  Incomplete  Blood Culture (routine x 2)     Status: None (Preliminary result)   Collection Time: 03/06/17  9:35 PM  Result Value Ref Range Status   Specimen Description RIGHT ANTECUBITAL  Final   Special Requests   Final    BOTTLES DRAWN AEROBIC AND ANAEROBIC Blood Culture adequate volume   Culture NO GROWTH < 12 HOURS  Final    Report Status PENDING  Incomplete         Radiology Studies: Dg Chest Port 1 View  Result Date: 03/06/2017 CLINICAL DATA:  57 y/o F; cough, body aches, congestion, chills, starting today. EXAM: PORTABLE CHEST 1 VIEW COMPARISON:  06/23/2016 chest radiograph FINDINGS: Stable heart size and mediastinal contours are within normal limits. Both lungs are clear. The visualized skeletal structures are unremarkable. IMPRESSION: No active disease. Electronically Signed   By: Kristine Garbe M.D.   On: 03/06/2017 22:03        Scheduled Meds: . aspirin EC  81 mg Oral Daily  . calcium-vitamin D  2 tablet Oral BID  . cycloSPORINE modified  300 mg Oral Q12H  . DULoxetine  60 mg Oral Daily  . enoxaparin (LOVENOX) injection  40 mg Subcutaneous Q24H  . magnesium oxide  1,200 mg Oral BID  . mycophenolate  1,000 mg Oral Q12H  . nortriptyline  50 mg Oral QHS  . pantoprazole  40 mg Oral BID AC  .  predniSONE  7.5 mg Oral Q breakfast  . QUEtiapine  50 mg Oral Q8H  . sodium bicarbonate  1,300 mg Oral BID  . [START ON 03/08/2017] sulfamethoxazole-trimethoprim  1 tablet Oral Q M,W,F   Continuous Infusions: . sodium chloride 125 mL/hr at 03/07/17 1712  . levofloxacin (LEVAQUIN) IV    . sodium chloride       LOS: 0 days    Time spent: 62mins    Kathie Dike, MD [facility] Pager [phone]  If 7PM-7AM, please contact night-coverage [email or web address] Password [facility] 03/07/2017, 6:59 PM

## 2017-03-07 NOTE — Progress Notes (Signed)
Pharmacy Antibiotic Note  [name] is a 57 y.o. female admitted on 03/06/2017 with sepsis.  Pharmacy has been consulted for Vancomycin, Levaquin, and Aztreonam dosing.  Plan: Vancomycin 1000mg  loading dose then 750mg  IV every 12 hours.  Goal trough 15-20 mcg/mL. Aztreonam 1gm IV q12h Levaquin 750mg  IV q24h F/U cxs and clinical progress Monitor V/S, labs and levels as indicated  Height: 5\' 6"  (167.6 cm) Weight: 192 lb 3.2 oz (87.2 kg) IBW/kg (Calculated) : 59.3  Temp (24hrs), Avg:98.8 F (37.1 C), Min:98.2 F (36.8 C), Max:100.2 F (37.9 C)  Recent Labs  Lab 03/06/17 2128 03/06/17 2156 03/07/17 0427  WBC 12.5*  --  10.8*  CREATININE 1.63*  --  1.37*  LATICACIDVEN  --  0.87  --     Estimated Creatinine Clearance: 51.6 mL/min (A) (by C-G formula based on SCr of 1.37 mg/dL (H)).    Allergies  Allergen Reactions  . Ancef [Cefazolin Sodium] Other (See Comments)    Reaction:  Vaginal and mouth blisters  . Tapentadol   . Gadobenate Itching, Nausea And Vomiting and Rash  . Iodinated Diagnostic Agents Itching, Nausea And Vomiting and Rash  . Tape Rash    Antimicrobials this admission: Vancomycin 1/28>>  Aztreonam 1/28 >>  Levaquin 1/28>>  Dose adjustments this admission: n/a  Microbiology results: 1/28 BCx: pending 1/28 influenzae: negative  Thank you for allowing pharmacy to be a part of this patient's care.  Isac Sarna, BS [name], [place] Clinical Pharmacist Pager [phone] 03/07/2017 12:37 PM

## 2017-03-07 NOTE — Care Management Obs Status (Signed)
MEDICARE OBSERVATION STATUS NOTIFICATION   Patient Details  Name: KRYSTELLE PRASHAD MRN: [id number] Date of Birth: 04/28/1960   Medicare Observation Status Notification Given:  Yes    Sherald Barge, RN 03/07/2017, 9:34 AM

## 2017-03-08 DIAGNOSIS — R061 Stridor: Secondary | ICD-10-CM | POA: Diagnosis not present

## 2017-03-08 DIAGNOSIS — A419 Sepsis, unspecified organism: Secondary | ICD-10-CM | POA: Diagnosis not present

## 2017-03-08 DIAGNOSIS — A0811 Acute gastroenteropathy due to Norwalk agent: Secondary | ICD-10-CM | POA: Diagnosis not present

## 2017-03-08 DIAGNOSIS — J208 Acute bronchitis due to other specified organisms: Secondary | ICD-10-CM

## 2017-03-08 DIAGNOSIS — Z79899 Other long term (current) drug therapy: Secondary | ICD-10-CM | POA: Diagnosis not present

## 2017-03-08 DIAGNOSIS — Z881 Allergy status to other antibiotic agents status: Secondary | ICD-10-CM | POA: Diagnosis not present

## 2017-03-08 DIAGNOSIS — E860 Dehydration: Secondary | ICD-10-CM | POA: Diagnosis not present

## 2017-03-08 DIAGNOSIS — D849 Immunodeficiency, unspecified: Secondary | ICD-10-CM | POA: Diagnosis not present

## 2017-03-08 DIAGNOSIS — Z87891 Personal history of nicotine dependence: Secondary | ICD-10-CM | POA: Diagnosis not present

## 2017-03-08 DIAGNOSIS — R509 Fever, unspecified: Secondary | ICD-10-CM | POA: Diagnosis not present

## 2017-03-08 DIAGNOSIS — N200 Calculus of kidney: Secondary | ICD-10-CM | POA: Diagnosis not present

## 2017-03-08 DIAGNOSIS — D649 Anemia, unspecified: Secondary | ICD-10-CM | POA: Diagnosis not present

## 2017-03-08 DIAGNOSIS — Z8619 Personal history of other infectious and parasitic diseases: Secondary | ICD-10-CM | POA: Diagnosis not present

## 2017-03-08 DIAGNOSIS — Z7682 Awaiting organ transplant status: Secondary | ICD-10-CM | POA: Diagnosis not present

## 2017-03-08 DIAGNOSIS — Z91041 Radiographic dye allergy status: Secondary | ICD-10-CM | POA: Diagnosis not present

## 2017-03-08 DIAGNOSIS — R419 Unspecified symptoms and signs involving cognitive functions and awareness: Secondary | ICD-10-CM | POA: Diagnosis not present

## 2017-03-08 DIAGNOSIS — R188 Other ascites: Secondary | ICD-10-CM | POA: Diagnosis not present

## 2017-03-08 DIAGNOSIS — D899 Disorder involving the immune mechanism, unspecified: Secondary | ICD-10-CM | POA: Diagnosis present

## 2017-03-08 DIAGNOSIS — N179 Acute kidney failure, unspecified: Secondary | ICD-10-CM | POA: Diagnosis not present

## 2017-03-08 DIAGNOSIS — Z9483 Pancreas transplant status: Secondary | ICD-10-CM

## 2017-03-08 DIAGNOSIS — R030 Elevated blood-pressure reading, without diagnosis of hypertension: Secondary | ICD-10-CM | POA: Diagnosis not present

## 2017-03-08 DIAGNOSIS — R69 Illness, unspecified: Secondary | ICD-10-CM | POA: Diagnosis not present

## 2017-03-08 DIAGNOSIS — F418 Other specified anxiety disorders: Secondary | ICD-10-CM | POA: Diagnosis not present

## 2017-03-08 DIAGNOSIS — A0819 Acute gastroenteropathy due to other small round viruses: Secondary | ICD-10-CM | POA: Diagnosis not present

## 2017-03-08 DIAGNOSIS — J111 Influenza due to unidentified influenza virus with other respiratory manifestations: Secondary | ICD-10-CM

## 2017-03-08 DIAGNOSIS — Z9482 Intestine transplant status: Secondary | ICD-10-CM | POA: Diagnosis not present

## 2017-03-08 DIAGNOSIS — Z9109 Other allergy status, other than to drugs and biological substances: Secondary | ICD-10-CM | POA: Diagnosis not present

## 2017-03-08 DIAGNOSIS — Z9489 Other transplanted organ and tissue status: Secondary | ICD-10-CM | POA: Diagnosis not present

## 2017-03-08 DIAGNOSIS — Z944 Liver transplant status: Secondary | ICD-10-CM | POA: Diagnosis not present

## 2017-03-08 DIAGNOSIS — N183 Chronic kidney disease, stage 3 (moderate): Secondary | ICD-10-CM | POA: Diagnosis not present

## 2017-03-08 DIAGNOSIS — Z66 Do not resuscitate: Secondary | ICD-10-CM | POA: Diagnosis not present

## 2017-03-08 LAB — CBC WITH DIFFERENTIAL/PLATELET
BASOS ABS: 0 10*3/uL (ref 0.0–0.1)
BLASTS: 0 %
Band Neutrophils: 9 %
Basophils Relative: 0 %
EOS PCT: 5 %
Eosinophils Absolute: 0.6 10*3/uL (ref 0.0–0.7)
HCT: 27.3 % — ABNORMAL LOW (ref 36.0–46.0)
Hemoglobin: 8 g/dL — ABNORMAL LOW (ref 12.0–15.0)
LYMPHS ABS: 0.2 10*3/uL — AB (ref 0.7–4.0)
LYMPHS PCT: 2 %
MCH: 25.3 pg — AB (ref 26.0–34.0)
MCHC: 29.3 g/dL — AB (ref 30.0–36.0)
MCV: 86.4 fL (ref 78.0–100.0)
MONOS PCT: 13 %
Metamyelocytes Relative: 0 %
Monocytes Absolute: 1.5 10*3/uL — ABNORMAL HIGH (ref 0.1–1.0)
Myelocytes: 0 %
NEUTROS ABS: 9.2 10*3/uL — AB (ref 1.7–7.7)
NEUTROS PCT: 71 %
NRBC: 0 /100{WBCs}
OTHER: 0 %
PLATELETS: 407 10*3/uL — AB (ref 150–400)
Promyelocytes Absolute: 0 %
RBC: 3.16 MIL/uL — AB (ref 3.87–5.11)
RDW: 16.1 % — ABNORMAL HIGH (ref 11.5–15.5)
WBC: 11.5 10*3/uL — AB (ref 4.0–10.5)

## 2017-03-08 LAB — COMPREHENSIVE METABOLIC PANEL
ALT: 111 U/L — ABNORMAL HIGH (ref 14–54)
ANION GAP: 13 (ref 5–15)
AST: 73 U/L — ABNORMAL HIGH (ref 15–41)
Albumin: 2.9 g/dL — ABNORMAL LOW (ref 3.5–5.0)
Alkaline Phosphatase: 155 U/L — ABNORMAL HIGH (ref 38–126)
BUN: 29 mg/dL — ABNORMAL HIGH (ref 6–20)
CALCIUM: 8.7 mg/dL — AB (ref 8.9–10.3)
CHLORIDE: 107 mmol/L (ref 101–111)
CO2: 20 mmol/L — ABNORMAL LOW (ref 22–32)
Creatinine, Ser: 1.34 mg/dL — ABNORMAL HIGH (ref 0.44–1.00)
GFR, EST AFRICAN AMERICAN: 51 mL/min — AB (ref >60)
GFR, EST NON AFRICAN AMERICAN: 44 mL/min — AB (ref >60)
Glucose, Bld: 107 mg/dL — ABNORMAL HIGH (ref 65–99)
Potassium: 4.2 mmol/L (ref 3.5–5.1)
SODIUM: 140 mmol/L (ref 135–145)
Total Bilirubin: 0.6 mg/dL (ref 0.3–1.2)
Total Protein: 5.9 g/dL — ABNORMAL LOW (ref 6.5–8.1)

## 2017-03-08 MED ORDER — IPRATROPIUM-ALBUTEROL 0.5-2.5 (3) MG/3ML IN SOLN: 3.0000 mL | Freq: Four times a day (QID) | RESPIRATORY_TRACT | Status: DC | PRN

## 2017-03-08 NOTE — Plan of Care (Signed)
  Adequate for Discharge Activity: Risk for activity intolerance will decrease 03/08/2017 1459 - Adequate for Discharge by Berton Bon, RN Pain Managment: General experience of comfort will improve 03/08/2017 1459 - Adequate for Discharge by Arhianna Ebey, Demetrio Lapping, RN

## 2017-03-08 NOTE — Discharge Summary (Signed)
Physician Discharge Summary  [name] FXT:[id number] DOB: Sep 20, 1960 DOA: 03/06/2017  PCP: Redmond School, MD  Admit date: 03/06/2017 Discharge date: 03/08/2017  Admitted From: Home Disposition:  Home   Recommendations for Outpatient Follow-up:  1. Follow up with PCP in 1-2 weeks 2. Please obtain BMP/CBC/LFTS in 1-2 days 3. 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  The patient was found to have WBC 12.5.  She was admitted and started on vancomycin, aztreonam, levofloxacin.  Blood cultures were obtained and remained negative throughout the hospitalization.  Influenza PCR was negative.  She was noted to have acute kidney injury with serum creatinine peaking at 1.63.  The patient was fluid resuscitated with improvement of her renal function.  During hospitalization, the patient gradually improved clinically.  The patient remained essentially afebrile hemodynamically stable with a T-max of 100.2 F.  Blood cultures remained negative.  Respiratory viral panel showed coronavirus.  Patient tolerated her diet without any nausea, vomiting, diarrhea, abdominal pain.    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 Melrose Nakayama at [facility] on 02/06/17. At that time, he reviewed the brain MRI performed in early December 2018 and found no evidence of malignancy or spread of donor cancer. He started Nortriptyline at 10 mg for one week and then increase to 20 mg thereafter and wants to see her back at the end of January to assess response to this therapy.  -current headache likely due to coronavirus -no signs of CNS infection nor focal neurologic deficits  Anxiety/depression -Continue duloxetine, Seroquel           Discharge Instructions   Allergies as of 03/08/2017      Reactions   Ancef [cefazolin Sodium] Other (See Comments)   Reaction:  Vaginal and mouth blisters   Tapentadol    Gadobenate Itching, Nausea And Vomiting, Rash   Iodinated Diagnostic Agents Itching, Nausea And Vomiting, Rash   Tape Rash      Medication List    TAKE these  medications   aspirin EC 81 MG tablet Take 81 mg by mouth daily.   calcium-vitamin D 500-200 MG-UNIT tablet Commonly known as:  OSCAL WITH D Take 2 tablets by mouth 2 (two) times daily.   DULoxetine 60 MG capsule Commonly known as:  CYMBALTA Take 60 mg by mouth daily.   GENGRAF 25 MG capsule Generic drug:  cycloSPORINE modified Take 300 mg by mouth every 12 (twelve) hours. Gengraf   magnesium oxide 400 MG tablet Commonly known as:  MAG-OX Take 1,200 mg by mouth 2 (two) times daily.   mycophenolate 250 MG capsule Commonly known as:  CELLCEPT Take 1,000 mg by mouth every 12 (twelve) hours.   nortriptyline 50 MG capsule Commonly known as:  PAMELOR Take 50 mg by mouth at bedtime.   pantoprazole 40 MG tablet Commonly known as:  PROTONIX Take 1 tablet (40 mg total) by mouth 2 (two) times daily before a meal.   predniSONE 5 MG tablet Commonly known as:  DELTASONE Take 7.5 mg by mouth daily with breakfast.   prochlorperazine 10 MG tablet Commonly known as:  COMPAZINE Take 10 mg by mouth every 8 (eight) hours as needed for nausea or vomiting.   QUEtiapine 50 MG tablet Commonly known as:  SEROQUEL Take 50 mg by mouth every 8 (eight) hours.   sodium bicarbonate 650 MG tablet Take 1,300 mg by mouth 2 (two) times daily.   sulfamethoxazole-trimethoprim 800-160 MG tablet Commonly known as:  BACTRIM DS,SEPTRA DS Take 1 tablet by mouth every Monday, Wednesday, and Friday.       Allergies  Allergen Reactions  . Ancef [Cefazolin Sodium] Other (See Comments)    Reaction:  Vaginal and mouth blisters  . Tapentadol   . Gadobenate Itching, Nausea And Vomiting and Rash  . Iodinated Diagnostic Agents Itching, Nausea And Vomiting and Rash  . Tape Rash    Consultations:  none   Procedures/Studies: Dg Chest Port 1 View  Result Date: 03/06/2017 CLINICAL DATA:  57 y/o F; cough, body aches, congestion, chills, starting today. EXAM: PORTABLE CHEST 1 VIEW COMPARISON:  06/23/2016  chest radiograph FINDINGS: Stable heart size and mediastinal contours are within normal limits. Both lungs are clear. The visualized skeletal structures are unremarkable. IMPRESSION: No active disease. Electronically Signed   By: Kristine Garbe M.D.   On: 03/06/2017 22:03        Discharge Exam: Vitals:   03/08/17 0500 03/08/17 0800  BP: (!) 178/94   Pulse: 88   Resp:    Temp: 98.2 F (36.8 C)   SpO2: 95% 96%   Vitals:   03/07/17 2155 03/08/17 0133 03/08/17 0500 03/08/17 0800  BP: (!) 158/88  (!) 178/94   Pulse: 89  88   Resp: 20     Temp: 98.4 F (36.9 C)  98.2 F (36.8 C)   TempSrc: Oral  Oral   SpO2: 94% 97% 95% 96%  Weight:   85.9 kg (189 lb 6 oz)   Height:        General: Pt is alert, awake, not in acute distress Cardiovascular: RRR, S1/S2 +, no rubs, no gallops Respiratory: CTA bilaterally, no wheezing, no rhonchi Abdominal: Soft, NT, ND, bowel sounds + Extremities: no edema, no cyanosis   The results of significant diagnostics from this hospitalization (including imaging, microbiology, ancillary and laboratory) are listed below for reference.    Significant Diagnostic Studies: Dg Chest Port 1 View  Result Date: 03/06/2017 CLINICAL DATA:  57 y/o F; cough, body aches, congestion, chills,  starting today. EXAM: PORTABLE CHEST 1 VIEW COMPARISON:  06/23/2016 chest radiograph FINDINGS: Stable heart size and mediastinal contours are within normal limits. Both lungs are clear. The visualized skeletal structures are unremarkable. IMPRESSION: No active disease. Electronically Signed   By: Kristine Garbe M.D.   On: 03/06/2017 22:03     Microbiology: Recent Results (from the past 240 hour(s))  Blood Culture (routine x 2)     Status: None (Preliminary result)   Collection Time: 03/06/17  9:28 PM  Result Value Ref Range Status   Specimen Description LEFT ANTECUBITAL  Final   Special Requests   Final    BOTTLES DRAWN AEROBIC AND ANAEROBIC Blood Culture  adequate volume   Culture NO GROWTH 2 DAYS  Final   Report Status PENDING  Incomplete  Blood Culture (routine x 2)     Status: None (Preliminary result)   Collection Time: 03/06/17  9:35 PM  Result Value Ref Range Status   Specimen Description RIGHT ANTECUBITAL  Final   Special Requests   Final    BOTTLES DRAWN AEROBIC AND ANAEROBIC Blood Culture adequate volume   Culture NO GROWTH 2 DAYS  Final   Report Status PENDING  Incomplete  Respiratory Panel by PCR     Status: Abnormal   Collection Time: 03/06/17 10:15 PM  Result Value Ref Range Status   Adenovirus NOT DETECTED NOT DETECTED Final   Coronavirus 229E NOT DETECTED NOT DETECTED Final   Coronavirus HKU1 NOT DETECTED NOT DETECTED Final   Coronavirus NL63 NOT DETECTED NOT DETECTED Final   Coronavirus OC43 DETECTED (A) NOT DETECTED Final   Metapneumovirus NOT DETECTED NOT DETECTED Final   Rhinovirus / Enterovirus NOT DETECTED NOT DETECTED Final   Influenza A NOT DETECTED NOT DETECTED Final   Influenza B NOT DETECTED NOT DETECTED Final   Parainfluenza Virus 1 NOT DETECTED NOT DETECTED Final   Parainfluenza Virus 2 NOT DETECTED NOT DETECTED Final   Parainfluenza Virus 3 NOT DETECTED NOT DETECTED Final   Parainfluenza Virus 4 NOT DETECTED NOT DETECTED Final   Respiratory Syncytial Virus NOT DETECTED NOT DETECTED Final   Bordetella pertussis NOT DETECTED NOT DETECTED Final   Chlamydophila pneumoniae NOT DETECTED NOT DETECTED Final   Mycoplasma pneumoniae NOT DETECTED NOT DETECTED Final    Comment: Performed at [facility], [place] [address], [place], [place] [address]     Labs: Basic Metabolic Panel: Recent Labs  Lab 03/06/17 2128 03/07/17 0427 03/08/17 0501  NA 140 139 140  K 4.8 4.4 4.2  CL 110 108 107  CO2 20* 22 20*  GLUCOSE 109* 102* 107*  BUN 39* 35* 29*  CREATININE 1.63* 1.37* 1.34*  CALCIUM 8.7* 8.6* 8.7*   Liver Function Tests: Recent Labs  Lab 03/06/17 2128 03/07/17 0427 03/08/17 0501  AST 28 29 73*    ALT 60* 54 111*  ALKPHOS 154* 139* 155*  BILITOT 0.5 0.6 0.6  PROT 6.1* 5.7* 5.9*  ALBUMIN 3.0* 2.9* 2.9*   No results for input(s): LIPASE, AMYLASE in the last 168 hours. No results for input(s): AMMONIA in the last 168 hours. CBC: Recent Labs  Lab 03/06/17 2128 03/07/17 0427 03/08/17 0501  WBC 12.5* 10.8* 11.5*  NEUTROABS 8.9* 8.5* 9.2*  HGB 8.2* 8.0* 8.0*  HCT 28.0* 27.0* 27.3*  MCV 88.3 87.1 86.4  PLT 500* 485* 407*   Cardiac Enzymes: No results for input(s): CKTOTAL, CKMB, CKMBINDEX, TROPONINI in the last 168 hours. BNP: Invalid input(s): POCBNP CBG: No results for input(s): GLUCAP  in the last 168 hours.  Time coordinating discharge:  Greater than 30 minutes  Signed:  Orson Eva, DO [facility] Pager: [phone] 03/08/2017, 11:07 AM

## 2017-03-09 DIAGNOSIS — Z7682 Awaiting organ transplant status: Secondary | ICD-10-CM | POA: Diagnosis not present

## 2017-03-09 DIAGNOSIS — Z9482 Intestine transplant status: Secondary | ICD-10-CM | POA: Diagnosis not present

## 2017-03-10 DIAGNOSIS — R061 Stridor: Secondary | ICD-10-CM | POA: Diagnosis not present

## 2017-03-10 DIAGNOSIS — R509 Fever, unspecified: Secondary | ICD-10-CM | POA: Diagnosis not present

## 2017-03-11 ENCOUNTER — Other Ambulatory Visit: Payer: Self-pay

## 2017-03-11 ENCOUNTER — Emergency Department (HOSPITAL_COMMUNITY): Payer: Medicare Other

## 2017-03-11 ENCOUNTER — Inpatient Hospital Stay (HOSPITAL_COMMUNITY)
Admission: EM | Admit: 2017-03-11 | Discharge: 2017-03-12 | DRG: 872 | Disposition: A | Discharge status: Other Acute Inpatient Hospital | Payer: Medicare Other | Attending: Family Medicine | Admitting: Family Medicine

## 2017-03-11 ENCOUNTER — Encounter (HOSPITAL_COMMUNITY): Payer: Self-pay | Admitting: Emergency Medicine

## 2017-03-11 DIAGNOSIS — Z9489 Other transplanted organ and tissue status: Secondary | ICD-10-CM | POA: Diagnosis not present

## 2017-03-11 DIAGNOSIS — R419 Unspecified symptoms and signs involving cognitive functions and awareness: Secondary | ICD-10-CM | POA: Diagnosis not present

## 2017-03-11 DIAGNOSIS — R188 Other ascites: Secondary | ICD-10-CM | POA: Diagnosis not present

## 2017-03-11 DIAGNOSIS — D649 Anemia, unspecified: Secondary | ICD-10-CM | POA: Diagnosis not present

## 2017-03-11 DIAGNOSIS — N183 Chronic kidney disease, stage 3 (moderate): Secondary | ICD-10-CM | POA: Diagnosis not present

## 2017-03-11 DIAGNOSIS — Z944 Liver transplant status: Secondary | ICD-10-CM | POA: Diagnosis not present

## 2017-03-11 DIAGNOSIS — F418 Other specified anxiety disorders: Secondary | ICD-10-CM | POA: Diagnosis present

## 2017-03-11 DIAGNOSIS — Z79899 Other long term (current) drug therapy: Secondary | ICD-10-CM

## 2017-03-11 DIAGNOSIS — Z91041 Radiographic dye allergy status: Secondary | ICD-10-CM | POA: Diagnosis not present

## 2017-03-11 DIAGNOSIS — R197 Diarrhea, unspecified: Secondary | ICD-10-CM

## 2017-03-11 DIAGNOSIS — N200 Calculus of kidney: Secondary | ICD-10-CM | POA: Diagnosis not present

## 2017-03-11 DIAGNOSIS — A0819 Acute gastroenteropathy due to other small round viruses: Secondary | ICD-10-CM | POA: Diagnosis not present

## 2017-03-11 DIAGNOSIS — Z8619 Personal history of other infectious and parasitic diseases: Secondary | ICD-10-CM | POA: Diagnosis not present

## 2017-03-11 DIAGNOSIS — E860 Dehydration: Secondary | ICD-10-CM | POA: Diagnosis present

## 2017-03-11 DIAGNOSIS — Z9109 Other allergy status, other than to drugs and biological substances: Secondary | ICD-10-CM

## 2017-03-11 DIAGNOSIS — Z881 Allergy status to other antibiotic agents status: Secondary | ICD-10-CM | POA: Diagnosis not present

## 2017-03-11 DIAGNOSIS — A0811 Acute gastroenteropathy due to Norwalk agent: Secondary | ICD-10-CM | POA: Diagnosis not present

## 2017-03-11 DIAGNOSIS — D899 Disorder involving the immune mechanism, unspecified: Secondary | ICD-10-CM

## 2017-03-11 DIAGNOSIS — A419 Sepsis, unspecified organism: Principal | ICD-10-CM | POA: Diagnosis present

## 2017-03-11 DIAGNOSIS — Z66 Do not resuscitate: Secondary | ICD-10-CM | POA: Diagnosis not present

## 2017-03-11 DIAGNOSIS — Z87891 Personal history of nicotine dependence: Secondary | ICD-10-CM | POA: Diagnosis not present

## 2017-03-11 DIAGNOSIS — D849 Immunodeficiency, unspecified: Secondary | ICD-10-CM | POA: Diagnosis not present

## 2017-03-11 DIAGNOSIS — Z9483 Pancreas transplant status: Secondary | ICD-10-CM | POA: Diagnosis not present

## 2017-03-11 DIAGNOSIS — R109 Unspecified abdominal pain: Secondary | ICD-10-CM | POA: Diagnosis present

## 2017-03-11 DIAGNOSIS — R112 Nausea with vomiting, unspecified: Secondary | ICD-10-CM | POA: Insufficient documentation

## 2017-03-11 LAB — GASTROINTESTINAL PANEL BY PCR, STOOL (REPLACES STOOL CULTURE)
Adenovirus F40/41: NOT DETECTED
Astrovirus: NOT DETECTED
CRYPTOSPORIDIUM: NOT DETECTED
CYCLOSPORA CAYETANENSIS: NOT DETECTED
Campylobacter species: NOT DETECTED
ENTEROAGGREGATIVE E COLI (EAEC): NOT DETECTED
Entamoeba histolytica: NOT DETECTED
Enteropathogenic E coli (EPEC): NOT DETECTED
Enterotoxigenic E coli (ETEC): NOT DETECTED
Giardia lamblia: NOT DETECTED
Norovirus GI/GII: DETECTED — AB
Plesimonas shigelloides: NOT DETECTED
ROTAVIRUS A: NOT DETECTED
SALMONELLA SPECIES: NOT DETECTED
SHIGELLA/ENTEROINVASIVE E COLI (EIEC): NOT DETECTED
Sapovirus (I, II, IV, and V): NOT DETECTED
Shiga like toxin producing E coli (STEC): NOT DETECTED
VIBRIO SPECIES: NOT DETECTED
Vibrio cholerae: NOT DETECTED
YERSINIA ENTEROCOLITICA: NOT DETECTED

## 2017-03-11 LAB — CBC WITH DIFFERENTIAL/PLATELET
BAND NEUTROPHILS: 0 %
BASOS ABS: 0 10*3/uL (ref 0.0–0.1)
Basophils Absolute: 0 10*3/uL (ref 0.0–0.1)
Basophils Relative: 0 %
Basophils Relative: 0 %
Blasts: 0 %
EOS ABS: 0.9 10*3/uL — AB (ref 0.0–0.7)
Eosinophils Absolute: 0.5 10*3/uL (ref 0.0–0.7)
Eosinophils Relative: 14 %
Eosinophils Relative: 8 %
HCT: 28.2 % — ABNORMAL LOW (ref 36.0–46.0)
HEMATOCRIT: 28.1 % — AB (ref 36.0–46.0)
HEMOGLOBIN: 8.3 g/dL — AB (ref 12.0–15.0)
HEMOGLOBIN: 8.5 g/dL — AB (ref 12.0–15.0)
LYMPHS ABS: 0.2 10*3/uL — AB (ref 0.7–4.0)
LYMPHS ABS: 0.6 10*3/uL — AB (ref 0.7–4.0)
LYMPHS PCT: 9 %
Lymphocytes Relative: 3 %
MCH: 25.8 pg — ABNORMAL LOW (ref 26.0–34.0)
MCH: 25.5 pg — AB (ref 26.0–34.0)
MCHC: 30.1 g/dL (ref 30.0–36.0)
MCHC: 29.5 g/dL — AB (ref 30.0–36.0)
MCV: 85.7 fL (ref 78.0–100.0)
MCV: 86.5 fL (ref 78.0–100.0)
METAMYELOCYTES PCT: 0 %
MONOS PCT: 23 %
MONOS PCT: 9 %
MYELOCYTES: 0 %
Monocytes Absolute: 1.5 10*3/uL — ABNORMAL HIGH (ref 0.1–1.0)
Monocytes Absolute: 0.6 10*3/uL (ref 0.1–1.0)
NEUTROS ABS: 4 10*3/uL (ref 1.7–7.7)
NEUTROS PCT: 74 %
Neutro Abs: 4.7 10*3/uL (ref 1.7–7.7)
Neutrophils Relative %: 60 %
Other: 0 %
PLATELETS: 479 10*3/uL — AB (ref 150–400)
Platelets: 470 10*3/uL — ABNORMAL HIGH (ref 150–400)
Promyelocytes Absolute: 0 %
RBC: 3.29 MIL/uL — ABNORMAL LOW (ref 3.87–5.11)
RBC: 3.25 MIL/uL — ABNORMAL LOW (ref 3.87–5.11)
RDW: 16.3 % — ABNORMAL HIGH (ref 11.5–15.5)
RDW: 16.2 % — ABNORMAL HIGH (ref 11.5–15.5)
WBC: 6.4 10*3/uL (ref 4.0–10.5)
WBC: 6.6 10*3/uL (ref 4.0–10.5)
nRBC: 0 /100 WBC

## 2017-03-11 LAB — URINALYSIS, ROUTINE W REFLEX MICROSCOPIC
Bilirubin Urine: NEGATIVE
Glucose, UA: NEGATIVE mg/dL
Hgb urine dipstick: NEGATIVE
Ketones, ur: NEGATIVE mg/dL
LEUKOCYTES UA: NEGATIVE
Nitrite: NEGATIVE
PH: 5 (ref 5.0–8.0)
PROTEIN: NEGATIVE mg/dL
SPECIFIC GRAVITY, URINE: 1.012 (ref 1.005–1.030)

## 2017-03-11 LAB — COMPREHENSIVE METABOLIC PANEL
ALBUMIN: 2.9 g/dL — AB (ref 3.5–5.0)
ALT: 70 U/L — AB (ref 14–54)
ALT: 79 U/L — AB (ref 14–54)
ANION GAP: 12 (ref 5–15)
AST: 30 U/L (ref 15–41)
AST: 42 U/L — AB (ref 15–41)
Albumin: 2.9 g/dL — ABNORMAL LOW (ref 3.5–5.0)
Alkaline Phosphatase: 168 U/L — ABNORMAL HIGH (ref 38–126)
Alkaline Phosphatase: 158 U/L — ABNORMAL HIGH (ref 38–126)
Anion gap: 8 (ref 5–15)
BILIRUBIN TOTAL: 0.6 mg/dL (ref 0.3–1.2)
BUN: 31 mg/dL — ABNORMAL HIGH (ref 6–20)
BUN: 42 mg/dL — AB (ref 6–20)
CHLORIDE: 110 mmol/L (ref 101–111)
CHLORIDE: 116 mmol/L — AB (ref 101–111)
CO2: 15 mmol/L — ABNORMAL LOW (ref 22–32)
CO2: 16 mmol/L — AB (ref 22–32)
CREATININE: 1.41 mg/dL — AB (ref 0.44–1.00)
Calcium: 8.2 mg/dL — ABNORMAL LOW (ref 8.9–10.3)
Calcium: 8.4 mg/dL — ABNORMAL LOW (ref 8.9–10.3)
Creatinine, Ser: 1.38 mg/dL — ABNORMAL HIGH (ref 0.44–1.00)
GFR calc Af Amer: 48 mL/min — ABNORMAL LOW (ref >60)
GFR calc non Af Amer: 41 mL/min — ABNORMAL LOW (ref >60)
GFR calc non Af Amer: 42 mL/min — ABNORMAL LOW (ref >60)
GFR, EST AFRICAN AMERICAN: 49 mL/min — AB (ref >60)
GLUCOSE: 107 mg/dL — AB (ref 65–99)
Glucose, Bld: 89 mg/dL (ref 65–99)
POTASSIUM: 4.7 mmol/L (ref 3.5–5.1)
Potassium: 4.2 mmol/L (ref 3.5–5.1)
SODIUM: 138 mmol/L (ref 135–145)
Sodium: 139 mmol/L (ref 135–145)
TOTAL PROTEIN: 6.2 g/dL — AB (ref 6.5–8.1)
Total Bilirubin: 0.6 mg/dL (ref 0.3–1.2)
Total Protein: 6.1 g/dL — ABNORMAL LOW (ref 6.5–8.1)

## 2017-03-11 LAB — CULTURE, BLOOD (ROUTINE X 2)
Culture: NO GROWTH
Culture: NO GROWTH
SPECIAL REQUESTS: ADEQUATE
Special Requests: ADEQUATE

## 2017-03-11 LAB — C DIFFICILE QUICK SCREEN W PCR REFLEX
C DIFFICILE (CDIFF) INTERP: NOT DETECTED
C DIFFICILE (CDIFF) TOXIN: NEGATIVE
C Diff antigen: NEGATIVE

## 2017-03-11 LAB — PHOSPHORUS: PHOSPHORUS: 4.2 mg/dL (ref 2.5–4.6)

## 2017-03-11 LAB — MAGNESIUM: Magnesium: 1.4 mg/dL — ABNORMAL LOW (ref 1.7–2.4)

## 2017-03-11 LAB — I-STAT CG4 LACTIC ACID, ED: Lactic Acid, Venous: 0.89 mmol/L (ref 0.5–1.9)

## 2017-03-11 MED ORDER — PROCHLORPERAZINE EDISYLATE 5 MG/ML IJ SOLN
5.0000 mg | Freq: Once | INTRAMUSCULAR | Status: AC
  Administered 2017-03-11: 5 mg via INTRAVENOUS
  Filled 2017-03-11: qty 2

## 2017-03-11 MED ORDER — ACETAMINOPHEN 325 MG PO TABS
ORAL_TABLET | ORAL | Status: AC
  Filled 2017-03-11: qty 1

## 2017-03-11 MED ORDER — PIPERACILLIN-TAZOBACTAM 3.375 G IVPB
3.3750 g | Freq: Three times a day (TID) | INTRAVENOUS | Status: DC
  Administered 2017-03-11 – 2017-03-12 (×5): 3.375 g via INTRAVENOUS
  Filled 2017-03-11 (×6): qty 50

## 2017-03-11 MED ORDER — SODIUM CHLORIDE 0.9 % IV BOLUS (SEPSIS)
1000.0000 mL | Freq: Once | INTRAVENOUS | Status: AC
  Administered 2017-03-11: 1000 mL via INTRAVENOUS

## 2017-03-11 MED ORDER — PROCHLORPERAZINE MALEATE 10 MG PO TABS
10.0000 mg | ORAL_TABLET | Freq: Three times a day (TID) | ORAL | Status: DC | PRN
  Administered 2017-03-12: 10 mg via ORAL
  Filled 2017-03-11 (×2): qty 1

## 2017-03-11 MED ORDER — MAGNESIUM SULFATE 2 GM/50ML IV SOLN: 2.0000 g | Freq: Once | INTRAVENOUS | Status: DC

## 2017-03-11 MED ORDER — ACETAMINOPHEN 325 MG PO TABS
650.0000 mg | ORAL_TABLET | Freq: Once | ORAL | Status: AC
  Administered 2017-03-11: 650 mg via ORAL
  Filled 2017-03-11: qty 2

## 2017-03-11 MED ORDER — MAGNESIUM OXIDE 400 MG PO TABS
1200.0000 mg | ORAL_TABLET | Freq: Two times a day (BID) | ORAL | Status: DC
  Administered 2017-03-11 – 2017-03-12 (×4): 1200 mg via ORAL
  Filled 2017-03-11 (×9): qty 3

## 2017-03-11 MED ORDER — PIPERACILLIN-TAZOBACTAM 3.375 G IVPB 30 MIN
3.3750 g | Freq: Once | INTRAVENOUS | Status: AC
  Administered 2017-03-11: 3.375 g via INTRAVENOUS
  Filled 2017-03-11: qty 50

## 2017-03-11 MED ORDER — CALCIUM CARBONATE-VITAMIN D 500-200 MG-UNIT PO TABS
2.0000 | ORAL_TABLET | Freq: Two times a day (BID) | ORAL | Status: DC
  Administered 2017-03-11 – 2017-03-12 (×4): 2 via ORAL
  Filled 2017-03-11 (×8): qty 2

## 2017-03-11 MED ORDER — QUETIAPINE FUMARATE 25 MG PO TABS
50.0000 mg | ORAL_TABLET | Freq: Three times a day (TID) | ORAL | Status: DC
  Administered 2017-03-11 – 2017-03-12 (×5): 50 mg via ORAL
  Filled 2017-03-11 (×5): qty 2

## 2017-03-11 MED ORDER — ACETAMINOPHEN 325 MG PO TABS: 650.0000 mg | ORAL_TABLET | Freq: Four times a day (QID) | ORAL | Status: DC | PRN

## 2017-03-11 MED ORDER — DIPHENHYDRAMINE HCL 50 MG/ML IJ SOLN
12.5000 mg | Freq: Once | INTRAMUSCULAR | Status: AC
  Administered 2017-03-11: 12.5 mg via INTRAVENOUS
  Filled 2017-03-11: qty 1

## 2017-03-11 MED ORDER — SODIUM BICARBONATE 650 MG PO TABS
1300.0000 mg | ORAL_TABLET | Freq: Two times a day (BID) | ORAL | Status: DC
  Administered 2017-03-11 – 2017-03-12 (×4): 1300 mg via ORAL
  Filled 2017-03-11 (×8): qty 2

## 2017-03-11 MED ORDER — ONDANSETRON HCL 4 MG/2ML IJ SOLN: 4.0000 mg | Freq: Four times a day (QID) | INTRAMUSCULAR | Status: DC | PRN

## 2017-03-11 MED ORDER — PANTOPRAZOLE SODIUM 40 MG PO TBEC
40.0000 mg | DELAYED_RELEASE_TABLET | Freq: Two times a day (BID) | ORAL | Status: DC
  Administered 2017-03-11 – 2017-03-12 (×4): 40 mg via ORAL
  Filled 2017-03-11 (×4): qty 1

## 2017-03-11 MED ORDER — PREDNISONE 5 MG PO TABS
7.5000 mg | ORAL_TABLET | Freq: Every day | ORAL | Status: DC
  Administered 2017-03-11 – 2017-03-12 (×2): 7.5 mg via ORAL
  Filled 2017-03-11: qty 2
  Filled 2017-03-11 (×3): qty 1

## 2017-03-11 MED ORDER — MYCOPHENOLATE MOFETIL 250 MG PO CAPS
1000.0000 mg | ORAL_CAPSULE | Freq: Two times a day (BID) | ORAL | Status: DC
  Administered 2017-03-11 – 2017-03-12 (×4): 1000 mg via ORAL
  Filled 2017-03-11 (×8): qty 4

## 2017-03-11 MED ORDER — VANCOMYCIN HCL IN DEXTROSE 750-5 MG/150ML-% IV SOLN
750.0000 mg | Freq: Two times a day (BID) | INTRAVENOUS | Status: DC
  Administered 2017-03-11 – 2017-03-12 (×3): 750 mg via INTRAVENOUS
  Filled 2017-03-11 (×7): qty 150

## 2017-03-11 MED ORDER — SODIUM CHLORIDE 0.45 % IV SOLN: INTRAVENOUS | Status: DC

## 2017-03-11 MED ORDER — SODIUM CHLORIDE 0.45 % IV SOLN
INTRAVENOUS | Status: DC
  Administered 2017-03-11 – 2017-03-12 (×3): via INTRAVENOUS

## 2017-03-11 MED ORDER — MORPHINE SULFATE (PF) 4 MG/ML IV SOLN
4.0000 mg | Freq: Once | INTRAVENOUS | Status: AC
  Administered 2017-03-11: 4 mg via INTRAVENOUS

## 2017-03-11 MED ORDER — CYCLOSPORINE MODIFIED (GENGRAF) 100 MG PO CAPS
300.0000 mg | ORAL_CAPSULE | Freq: Two times a day (BID) | ORAL | Status: DC
  Administered 2017-03-11 – 2017-03-12 (×4): 300 mg via ORAL
  Filled 2017-03-11: qty 12
  Filled 2017-03-11: qty 3
  Filled 2017-03-11 (×6): qty 12

## 2017-03-11 MED ORDER — ACETAMINOPHEN 650 MG RE SUPP: 650.0000 mg | Freq: Four times a day (QID) | RECTAL | Status: DC | PRN

## 2017-03-11 MED ORDER — DULOXETINE HCL 60 MG PO CPEP
60.0000 mg | ORAL_CAPSULE | Freq: Every day | ORAL | Status: DC
  Administered 2017-03-11 – 2017-03-12 (×2): 60 mg via ORAL
  Filled 2017-03-11: qty 1
  Filled 2017-03-11: qty 2

## 2017-03-11 MED ORDER — MORPHINE SULFATE (PF) 2 MG/ML IV SOLN
INTRAVENOUS | Status: AC
  Filled 2017-03-11: qty 2

## 2017-03-11 MED ORDER — VANCOMYCIN HCL IN DEXTROSE 1-5 GM/200ML-% IV SOLN
1000.0000 mg | Freq: Once | INTRAVENOUS | Status: AC
  Administered 2017-03-11: 1000 mg via INTRAVENOUS
  Filled 2017-03-11: qty 200

## 2017-03-11 MED ORDER — ONDANSETRON HCL 4 MG PO TABS
4.0000 mg | ORAL_TABLET | Freq: Four times a day (QID) | ORAL | Status: DC | PRN
  Filled 2017-03-11: qty 1

## 2017-03-11 MED ORDER — NORTRIPTYLINE HCL 25 MG PO CAPS
50.0000 mg | ORAL_CAPSULE | Freq: Every day | ORAL | Status: DC
  Administered 2017-03-11 – 2017-03-12 (×2): 50 mg via ORAL
  Filled 2017-03-11 (×4): qty 2

## 2017-03-11 MED ORDER — ASPIRIN EC 81 MG PO TBEC
81.0000 mg | DELAYED_RELEASE_TABLET | Freq: Every day | ORAL | Status: DC
  Administered 2017-03-11 – 2017-03-12 (×2): 81 mg via ORAL
  Filled 2017-03-11 (×2): qty 1

## 2017-03-11 MED ORDER — SULFAMETHOXAZOLE-TRIMETHOPRIM 800-160 MG PO TABS: 1.0000 | ORAL_TABLET | ORAL | Status: DC

## 2017-03-11 NOTE — Progress Notes (Addendum)
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  The patient was recently discharged from the hospital after a stay from 03/06/17 through 03/08/17.  During that admission, but the patient was admitted for influenza-like illness.  Influenza PCR was negative, but respiratory virus panel showed coronavirus.  Although the patient had a low-grade temperature of 100.2 F on the first day of admission, she remained afebrile throughout the rest of that hospitalization.  She was also noted to have acute kidney injury secondary to dehydration which improved with IV fluid resuscitation.  The patient was discharged in stable condition, she followed up at the [facility] transplant clinic on 03/09/2017.  EBV DNA was negative at that time.  There were no new changes at her transplant clinic visit.   The patient had 6 episodes of watery stools on 03/10/2017 without any hematochezia or melena.  She had some associated right lower quadrant abdominal pain and epigastric pain.  There was no hematemesis.  The patient denied any chest pain, shortness breath, dysuria, hematuria, vaginal discharge.  She continues to complain of her chronic daily headache without any visual disturbance or focal neurologic deficits.  CT abdomen and pelvis in the ED showed a stool-filled colon without any bowel wall thickening although this was confounded by the patient's  ascites which limited visualization of any bowel wall thickening.  Nevertheless, there were no other acute abnormalities.  In the ED, the patient was initially febrile with a temperature 102.13F and tachycardic in 110s, but remained hemodynamically stable saturating 95-99% on room air.  EDP spoke with the transplant, Dr.Dr. Colonel Bald who recommended transfer to [facility].  However there were no beds available.  EDP also spoke with gastroenterology, Dr. Barney Drain, who felt patient needed transfer to [facility] while waiting for a bed at [facility]   Assessment/Plan: SIRS -pt presented with fever and tachycardia -work up in progress for underlying infection -continue vancomycin and zosyn -lactic acid 0.99 -follow blood culture and urine culture -UA neg for pyuria -personally reviewed CXR--neg for edema or infiltrate -Cdiff negative -Stool pathogen panel--pending -check stool for cryptosporidium, isospora, microsporidium -check CMV DNA -03/09/17 EBV DNA negative -restart IVF  Diarrhea -02/24/2017 colonoscopy-- biopsies negative for rejection or infectious process -02/24/2017 liver biopsy--negative for rejection or infectious process -03/11/17--Cdiff negative -03/11/17--Stool pathogen panel--pending -check stool for cryptosporidium, isospora, microsporidium -check CMV DNA -03/09/17 EBV DNA negative  Dehydration -Due to volume depletion -Baseline creatinine 1.0-1.2 -repeat CMP  Transaminasemia -02/24/17 liver biopsy neg for rejection -02/24/17 colon and SB biopsy neg for infection or rejection -AST/ALT/ALP mildly elevated at time of d/c and 03/09/17-->trending down -03/09/17--EBV DNA--neg -check CMV DNA  Chronic daily headache -She saw Neurology Dr. Melrose Nakayama at [facility] on 02/06/17. At that time, he reviewed the brain MRI performed in early December 2018 and found no evidence of malignancy or spread of donor cancer. He started Nortriptyline at 10 mg for one week and then increase to 20 mg thereafter and  wants to see  her back at the end of January to assess response to this therapy.  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 Oneida Alar  Family Communication:  No Family at bedside -Total time spent 45 minutes.  Greater than 50% spent face to face counseling and coordinating care. 0700 to 0745   Consultants:  GI  Code Status:  FULL  DVT Prophylaxis: [place] Lovenox   Procedures: As Listed in Progress Note Above  Antibiotics: vanco 2/1>>> Zosyn 2/1>>>    Subjective: Patient states that her dry heaving and vomiting are improving.  She still continues to have loose stools without hematochezia or melena.  She has right lower quadrant and epigastric pain.  She denies any chest pain, shortness breath, hemoptysis, hematochezia, melena, dysuria, hematuria.  Objective: Vitals:   03/11/17 0430 03/11/17 0530 03/11/17 0600 03/11/17 0630  BP: (!) 174/104 (!) 155/88 (!) 150/87 (!) 159/99  Pulse: (!) 103 (!) 110 (!) 105 98  Resp: (!) 22 (!) 23 (!) 25 19  Temp:  99 F (37.2 C)    TempSrc:  Oral    SpO2: 99% 91% 93% 92%  Weight:      Height:        Intake/Output Summary (Last 24 hours) at 03/11/2017 0733 Last data filed at 03/11/2017 0406 Gross per 24 hour  Intake 4500 ml  Output -  Net 4500 ml   Weight change:  Exam:   General:  Pt is alert, follows commands appropriately, not in acute distress  HEENT: No icterus, No thrush, No neck mass, [place]/AT; no meningismus  Cardiovascular: RRR, S1/S2, no rubs, no gallops  Respiratory: CTA  bilaterally, no wheezing, no crackles, no rhonchi  Abdomen: Soft/+BS, RLQ, epigastric tender, non distended, no guarding  Extremities: No edema, No lymphangitis, No petechiae, No rashes, no synovitis  Neuro:  CN II-XII intact, strength 4/5 in RUE, RLE, strength 4/5 LUE, LLE; sensation intact bilateral; no dysmetria; babinski equivocal    Data Reviewed: I have personally reviewed following labs and imaging studies Basic Metabolic Panel: Recent Labs  Lab 03/06/17 2128 03/07/17 0427 03/08/17 0501 03/11/17 0041  NA 140 139 140 139  K 4.8 4.4 4.2 4.7  CL 110 108 107 116*  CO2 20* 22 20* 15*  GLUCOSE 109* 102* 107* 107*  BUN 39* 35* 29* 42*  CREATININE 1.63* 1.37* 1.34* 1.41*  CALCIUM 8.7* 8.6* 8.7* 8.2*   Liver Function Tests: Recent Labs  Lab 03/06/17 2128 03/07/17 0427 03/08/17 0501 03/11/17 0041  AST 28 29 73* 42*  ALT 60* 54 111* 79*  ALKPHOS 154* 139* 155* 168*  BILITOT 0.5 0.6 0.6 0.6  PROT 6.1* 5.7* 5.9* 6.2*  ALBUMIN 3.0* 2.9* 2.9* 2.9*   No results for input(s): LIPASE, AMYLASE in the last 168 hours. No results for input(s): AMMONIA in the last 168 hours. Coagulation Profile: No results for input(s): INR, PROTIME in the last 168 hours. CBC: Recent Labs  Lab 03/06/17 2128 03/07/17 0427 03/08/17 0501 03/11/17 0041  WBC 12.5* 10.8* 11.5* 6.6  NEUTROABS 8.9* 8.5* 9.2* 4.0  HGB 8.2* 8.0* 8.0* 8.5*  HCT 28.0* 27.0*  27.3* 28.2*  MCV 88.3 87.1 86.4 85.7  PLT 500* 485* 407* 479*   Cardiac Enzymes: No results for input(s): CKTOTAL, CKMB, CKMBINDEX, TROPONINI in the last 168 hours. BNP: Invalid input(s): POCBNP CBG: No results for input(s): GLUCAP in the last 168 hours. HbA1C: No results for input(s): HGBA1C in the last 72 hours. Urine analysis:    Component Value Date/Time   COLORURINE STRAW (A) 03/11/2017 0222   APPEARANCEUR CLEAR 03/11/2017 0222   LABSPEC 1.012 03/11/2017 0222   PHURINE 5.0 03/11/2017 0222   GLUCOSEU NEGATIVE 03/11/2017 0222    HGBUR NEGATIVE 03/11/2017 0222   BILIRUBINUR NEGATIVE 03/11/2017 0222   KETONESUR NEGATIVE 03/11/2017 0222   PROTEINUR NEGATIVE 03/11/2017 0222   UROBILINOGEN 0.2 05/28/2013 0206   NITRITE NEGATIVE 03/11/2017 0222   LEUKOCYTESUR NEGATIVE 03/11/2017 0222   Sepsis Labs: @LABRCNTIP (procalcitonin:4,lacticidven:4) ) Recent Results (from the past 240 hour(s))  Blood Culture (routine x 2)     Status: None   Collection Time: 03/06/17  9:28 PM  Result Value Ref Range Status   Specimen Description LEFT ANTECUBITAL  Final   Special Requests   Final    BOTTLES DRAWN AEROBIC AND ANAEROBIC Blood Culture adequate volume   Culture   Final    NO GROWTH 5 DAYS Performed at [facility], [address], [place], [place] [address]    Report Status 03/11/2017 FINAL  Final  Blood Culture (routine x 2)     Status: None   Collection Time: 03/06/17  9:35 PM  Result Value Ref Range Status   Specimen Description RIGHT ANTECUBITAL  Final   Special Requests   Final    BOTTLES DRAWN AEROBIC AND ANAEROBIC Blood Culture adequate volume   Culture   Final    NO GROWTH 5 DAYS Performed at [facility], [address] [place], [place] [address]    Report Status 03/11/2017 FINAL  Final  Respiratory Panel by PCR     Status: Abnormal   Collection Time: 03/06/17 10:15 PM  Result Value Ref Range Status   Adenovirus NOT DETECTED NOT DETECTED Final   Coronavirus 229E NOT DETECTED NOT DETECTED Final   Coronavirus HKU1 NOT DETECTED NOT DETECTED Final   Coronavirus NL63 NOT DETECTED NOT DETECTED Final   Coronavirus OC43 DETECTED (A) NOT DETECTED Final   Metapneumovirus NOT DETECTED NOT DETECTED Final   Rhinovirus / Enterovirus NOT DETECTED NOT DETECTED Final   Influenza A NOT DETECTED NOT DETECTED Final   Influenza B NOT DETECTED NOT DETECTED Final   Parainfluenza Virus 1 NOT DETECTED NOT DETECTED Final   Parainfluenza Virus 2 NOT DETECTED NOT DETECTED Final   Parainfluenza Virus 3 NOT DETECTED NOT DETECTED Final     Parainfluenza Virus 4 NOT DETECTED NOT DETECTED Final   Respiratory Syncytial Virus NOT DETECTED NOT DETECTED Final   Bordetella pertussis NOT DETECTED NOT DETECTED Final   Chlamydophila pneumoniae NOT DETECTED NOT DETECTED Final   Mycoplasma pneumoniae NOT DETECTED NOT DETECTED Final    Comment: Performed at [facility], [place] [address], [place], [place] [address]  C difficile quick scan w PCR reflex     Status: None   Collection Time: 03/11/17 12:14 AM  Result Value Ref Range Status   C Diff antigen NEGATIVE NEGATIVE Final   C Diff toxin NEGATIVE NEGATIVE Final   C Diff interpretation No C. difficile detected.  Final    Comment: VALID Performed at [facility], [address], [place], [place] [address]   Blood Culture (routine x  2)     Status: None (Preliminary result)   Collection Time: 03/11/17 12:41 AM  Result Value Ref Range Status   Specimen Description BLOOD  Final   Special Requests NONE  Final   Culture   Final    NO GROWTH < 12 HOURS Performed at [facility], [address] [place], [place] [address]    Report Status PENDING  Incomplete  Blood Culture (routine x 2)     Status: None (Preliminary result)   Collection Time: 03/11/17  1:00 AM  Result Value Ref Range Status   Specimen Description BLOOD  Final   Special Requests NONE  Final   Culture   Final    NO GROWTH < 12 HOURS Performed at [facility], [address] [place], [place] [address]    Report Status PENDING  Incomplete     Scheduled Meds: . acetaminophen      . aspirin EC  81 mg Oral Daily  . calcium-vitamin D  2 tablet Oral BID  . cycloSPORINE modified  300 mg Oral Q12H  . DULoxetine  60 mg Oral Daily  . magnesium oxide  1,200 mg Oral BID  . morphine      . mycophenolate  1,000 mg Oral Q12H  . nortriptyline  50 mg Oral QHS  . pantoprazole  40 mg Oral BID AC  . predniSONE  7.5 mg Oral Q breakfast  . QUEtiapine  50 mg Oral Q8H  . sodium bicarbonate  1,300 mg Oral BID  . [START ON  03/13/2017] sulfamethoxazole-trimethoprim  1 tablet Oral Q M,W,F   Continuous Infusions: . sodium chloride    . piperacillin-tazobactam (ZOSYN)  IV      Procedures/Studies: Ct Abdomen Pelvis Wo Contrast  Result Date: 03/11/2017 CLINICAL DATA:  Diarrhea and fever since discharge from [facility] on Wednesday. Symptoms worse today. Multiple organ transplants in July. EXAM: CT ABDOMEN AND PELVIS WITHOUT CONTRAST TECHNIQUE: Multidetector CT imaging of the abdomen and pelvis was performed following the standard protocol without IV contrast. COMPARISON:  07/02/2016 FINDINGS: Lower chest: Lung bases are clear. Mild cardiac enlargement with minimal pericardial effusion. Hepatobiliary: Liver configuration and parenchymal pattern are homogeneous and normal. Presumably this represents an interval liver transplants since previous study. No focal lesions identified. Gallbladder is absent. Pancreas: Limited visualization of the pancreas. Visualized portion appears intact. Spleen: Spleen is surgically absent. Adrenals/Urinary Tract: No adrenal gland nodules. Multiple calcifications throughout the right kidney, largest in the upper pole measuring 9 mm in diameter. No hydronephrosis or hydroureter. No ureteral stones identified. Left kidney in ureter and the bladder are unremarkable. Stomach/Bowel: Stomach is decompressed. Small bowel are mostly decompressed. Colon is diffusely stool-filled. No definite evidence of wall thickening although abdominal fluid limits evaluation of the bowel wall on unenhanced scan. Prominent mesenteric vascularity is nonspecific and could represent varices. Visualization is limited due to mesenteric fluid. Scattered diverticula in the sigmoid colon. Vascular/Lymphatic: No aortic aneurysm. Scattered aortic calcifications. No significant lymphadenopathy. Reproductive: Status post hysterectomy. No adnexal masses. Other: There is diffuse free fluid throughout the abdomen and pelvis and  throughout the mesentery. Hounsfield unit measurements are consistent with non hemorrhagic fluid. No free air in the abdomen. Postoperative changes over the anterior abdominal wall. Surgical clips in the abdomen. Musculoskeletal: No acute or significant osseous findings. Unenhanced CT was performed per order. Lack of IV contrast limits sensitivity and specificity, especially for evaluation of abdominal/pelvic solid viscera. IMPRESSION: 1. There appears to been a liver transplant since the previous study.  Resection of the spleen. Possible pancreatic surgery. Gallbladder is absent. 2. Large amount of free fluid throughout the abdomen and pelvis and in the mesentery. No free air. This is likely due to ascites. 3. No definite evidence of bowel obstruction or inflammation although evaluation of the bowel wall is limited due to the large amount of fluid. 4. Nonobstructing stones in the right kidney similar to prior study. Electronically Signed   By: Lucienne Capers M.D.   On: 03/11/2017 02:22   Dg Chest 2 View  Result Date: 03/11/2017 CLINICAL DATA:  Sepsis. EXAM: CHEST  2 VIEW COMPARISON:  Radiograph of March 06, 2017. FINDINGS: Stable cardiomediastinal silhouette. No pneumothorax or pleural effusion is noted. Both lungs are clear. The visualized skeletal structures are unremarkable. IMPRESSION: No active cardiopulmonary disease. Electronically Signed   By: Marijo Conception, M.D.   On: 03/11/2017 01:55   Dg Chest Port 1 View  Result Date: 03/06/2017 CLINICAL DATA:  57 y/o F; cough, body aches, congestion, chills, starting today. EXAM: PORTABLE CHEST 1 VIEW COMPARISON:  06/23/2016 chest radiograph FINDINGS: Stable heart size and mediastinal contours are within normal limits. Both lungs are clear. The visualized skeletal structures are unremarkable. IMPRESSION: No active disease. Electronically Signed   By: Kristine Garbe M.D.   On: 03/06/2017 22:03    Orson Eva, DO  [facility] Pager  [phone]  If 7PM-7AM, please contact night-coverage [email or web address] Password [facility] 03/11/2017, 7:33 AM   LOS: 0 days

## 2017-03-11 NOTE — Progress Notes (Signed)
Received report on pt.

## 2017-03-11 NOTE — Progress Notes (Signed)
Pharmacy Antibiotic Note  [name] is a 57 y.o. female admitted on 03/11/2017 with sepsis.  Pharmacy has been consulted for vancomycin and zosyn dosing. Vanc 1 gm and zosyn 3.375 gm given earlier in the ED.  Plan: Zosyn 3.375g IV q8h (4 hour infusion).  Vancomycin 750 mg IV q12 hours F/u renal function, cultures and clinical course   Height: 5\' 6"  (167.6 cm) Weight: 175 lb (79.4 kg) IBW/kg (Calculated) : 59.3  Temp (24hrs), Avg:100.5 F (38.1 C), Min:99 F (37.2 C), Max:102 F (38.9 C)  Recent Labs  Lab 03/06/17 2128 03/06/17 2156 03/07/17 0427 03/08/17 0501 03/11/17 0041 03/11/17 0109  WBC 12.5*  --  10.8* 11.5* 6.6  --   CREATININE 1.63*  --  1.37* 1.34* 1.41*  --   LATICACIDVEN  --  0.87  --   --   --  0.89    Estimated Creatinine Clearance: 47.9 mL/min (A) (by C-G formula based on SCr of 1.41 mg/dL (H)).    Allergies  Allergen Reactions  . Ancef [Cefazolin Sodium] Other (See Comments)    Reaction:  Vaginal and mouth blisters  . Tapentadol   . Gadobenate Itching, Nausea And Vomiting and Rash  . Iodinated Diagnostic Agents Itching, Nausea And Vomiting and Rash  . Tape Rash     Thank you for allowing pharmacy to be a part of this patient's care.  Excell Seltzer Poteet 03/11/2017 8:49 AM

## 2017-03-11 NOTE — ED Notes (Signed)
Pt ambulated to bathroom with minimal assistance from this tech

## 2017-03-11 NOTE — H&P (Addendum)
History and Physical    [name]:[id number] DOB: 08-28-1960 DOA: 03/11/2017  PCP: Redmond School, MD   Patient coming from: Home  Chief Complaint: Diarrhea  HPI: [name] is a 57 y.o. female with medical history significant for liver, pancreas, stomach, small and large bowel transplants at [facility] on immunosuppressive therapy, type 2 diabetes, anemia, anxiety/depression, and recent hospital admission for coronavirus infection with discharged on 03/08/2017 who states that she has been having worsening diarrhea since leaving the hospital.  She has had some associated fevers up to 103 F at home along with some mild nausea and dry heaving.  She is also noted to have some right-sided abdominal pain with no radiation and also has some mild diffuse body aches, chills, and some diaphoresis.  She was actually seen by her transplant clinic on 1/31 and was noted to be doing well at that time.   ED Course: Her lab work appears to be stable compared to laboratory data on recent admission.  CT of her abdomen demonstrates some ascites and chest x-ray as well as urinalysis were negative for any acute findings.  She is noted to be febrile in the ED but vital signs are stable.  She has no significant leukocytosis or lactic acid elevation and LFTs are not elevated compared to prior.  She has been given a 2 L fluid bolus and has been started on vancomycin and Zosyn with improvement in her temperature noted.  ED physician Dr.Rancour had spoken with Dr. Colonel Bald with the transplant team at [facility] who has accepted the patient, but currently has no beds available.  He agrees with the plan to continue on IV antibiotics and monitor carefully.  He then spoke with Dr. Oneida Alar of gastroenterology at my request, who recommends transfer to [facility] for further evaluation not only with GI, but with ID at the facility until transfer to [facility] can be arranged. Patient is agreeable to this plan.  Review of Systems: As  per HPI otherwise 10 point review of systems negative.   Past Medical History:  Diagnosis Date  . Anemia   . Anxiety   . Ascites   . 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 Complication of anesthesia   . Depression   . Diabetes mellitus type 2, controlled, without complications ([place])   . Enteropathy 07/31/2015   Portal hypertensive enteropathy per capsule study, with stigmata of bleeding.  . Esophageal varices ([place])   . Gastric ulcer 07/15/2015   per EGD; no stigmata of bleeding  . GI bleed 07/17/2015  . Gram-negative bacteremia 12/08/2011  . DPOEUMPN(361.4)    "monthly" (11/07/2012)  . Heart murmur   . Hepatitis C    Hx: of Hep "C" it was eradicated  . History of hepatitis C - successfuly treated medically 07/14/2015  . Kidney stones   . Liver failure ([place])    "I'm on liver transplant list @ [facility]" (11/07/2012)  . PONV (postoperative nausea and vomiting)   . Portal vein thrombosis   . Renal insufficiency   . Right ureteral stone 12/07/2011  . 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 Type II diabetes mellitus ([place])    "not since gastric bypass" (11/07/2012)    Past Surgical History:  Procedure Laterality Date  . ABDOMINAL HYSTERECTOMY  2003  . CESAREAN SECTION  1995  . CHOLECYSTECTOMY  1987  . COLONOSCOPY    . COLONOSCOPY  10/21/2011   Procedure: COLONOSCOPY;  Surgeon: Rogene Houston, MD;  [facility];  Service: Endoscopy;  Laterality: N/A;  245   . CYSTOSCOPY W/ RETROGRADES  12/08/2011   Procedure: CYSTOSCOPY WITH RETROGRADE PYELOGRAM;  Surgeon: Marissa Nestle, MD;  Location: [facility] ORS;  Service: Urology;  Laterality: Right;  . CYSTOSCOPY WITH STENT PLACEMENT  12/08/2011   Procedure: CYSTOSCOPY WITH STENT PLACEMENT;  Surgeon: Marissa Nestle, MD;  Location: [facility] ORS;  Service: Urology;  Laterality: Right;  . DILATION AND CURETTAGE OF UTERUS    . ESOPHAGOGASTRODUODENOSCOPY N/A 02/19/2014   Procedure: ESOPHAGOGASTRODUODENOSCOPY (EGD);  Surgeon: Rogene Houston,  MD;  Location: [facility];  Service: Endoscopy;  Laterality: N/A;  100  . ESOPHAGOGASTRODUODENOSCOPY N/A 07/15/2015   Procedure: ESOPHAGOGASTRODUODENOSCOPY (EGD);  Surgeon: Rogene Houston, MD;  [facility]: [facility];  Service: Endoscopy;  Laterality: N/A;  . ESOPHAGOGASTRODUODENOSCOPY N/A 07/29/2015   Procedure: ESOPHAGOGASTRODUODENOSCOPY (EGD);  Surgeon: Rogene Houston, MD;  Location: [facility];  Service: Endoscopy;  Laterality: N/A;  . GASTRIC BYPASS  ~ 1995  . HERNIA REPAIR  04/14/11   "one in my bellybutton" (11/07/2012)  . INGUINAL HERNIA REPAIR Right    "maybe 2" (11/07/2012)  . KNEE ARTHROSCOPY Right   . LITHOTRIPSY     "several times" (11/07/2012)  . OPEN REDUCTION INTERNAL FIXATION (ORIF) DISTAL RADIAL FRACTURE Right 11/07/2012   Procedure: OPEN REDUCTION INTERNAL FIXATION (ORIF) RIGHT DISTAL RADIAL FRACTURE;  Surgeon: [name], MD;  Location: [place];  Service: Orthopedics;  Laterality: Right;  . ORIF DISTAL RADIUS FRACTURE Right 11/07/2012  . OTHER SURGICAL HISTORY  08/14/2016   5 organ transplant- liver, stomach, pancreas, small and large intestine  . UPPER GASTROINTESTINAL ENDOSCOPY       reports that she quit smoking about 25 years ago. Her smoking use included cigarettes. She has a 2.50 pack-year smoking history. she has never used smokeless tobacco. She reports that she does not drink alcohol or use drugs.  Allergies  Allergen Reactions  . Ancef [Cefazolin Sodium] Other (See Comments)    Reaction:  Vaginal and mouth blisters  . Tapentadol   . Gadobenate Itching, Nausea And Vomiting and Rash  . Iodinated Diagnostic Agents Itching, Nausea And Vomiting and Rash  . Tape Rash    Family History  Problem Relation Age of Onset  . Dementia Mother   . Heart disease Father   . Liver disease Father   . Hypertension Father   . Diabetes Father   . Dementia Father   . Hypertension Brother   . Other Son        Cervical dystonia    Prior to Admission medications     Medication Sig Start Date End Date Taking? Authorizing Provider  aspirin EC 81 MG tablet Take 81 mg by mouth daily.    [provider]  calcium-vitamin D (OSCAL WITH D) 500-200 MG-UNIT tablet Take 2 tablets by mouth 2 (two) times daily.    [provider]  cycloSPORINE modified (GENGRAF) 25 MG capsule Take 300 mg by mouth every 12 (twelve) hours. Gengraf    [provider]  DULoxetine (CYMBALTA) 60 MG capsule Take 60 mg by mouth daily.    [provider]  magnesium oxide (MAG-OX) 400 MG tablet Take 1,200 mg by mouth 2 (two) times daily.    [provider]  mycophenolate (CELLCEPT) 250 MG capsule Take 1,000 mg by mouth every 12 (twelve) hours.    [provider]  nortriptyline (PAMELOR) 50 MG capsule Take 50 mg by mouth at bedtime.  [provider]  pantoprazole (PROTONIX) 40 MG tablet Take 1 tablet (40 mg total) by mouth 2 (two) times daily before a meal. 11/17/16   Rehman, Mechele Dawley, MD  predniSONE (DELTASONE) 5 MG tablet Take 7.5 mg by mouth daily with breakfast.    [provider]  prochlorperazine (COMPAZINE) 10 MG tablet Take 10 mg by mouth every 8 (eight) hours as needed for nausea or vomiting.    [provider]  QUEtiapine (SEROQUEL) 50 MG tablet Take 50 mg by mouth every 8 (eight) hours.    [provider]  sodium bicarbonate 650 MG tablet Take 1,300 mg by mouth 2 (two) times daily.    [provider]  sulfamethoxazole-trimethoprim (BACTRIM DS,SEPTRA DS) 800-160 MG tablet Take 1 tablet by mouth every Monday, Wednesday, and Friday.    [provider]    Physical Exam: Vitals:   03/11/17 0330 03/11/17 0400 03/11/17 0430 03/11/17 0530  BP: (!) 170/96 (!) 158/108 (!) 174/104 (!) 152/87  Pulse: (!) 111 (!) 111 (!) 103 (!) 112  Resp: (!) 25 (!) 25 (!) 22 (!) 21  Temp:    99 F (37.2 C)  TempSrc:    Oral  SpO2: 96% 96% 99% 94%  Weight:      Height:        Constitutional: NAD,  calm, comfortable Vitals:   03/11/17 0330 03/11/17 0400 03/11/17 0430 03/11/17 0530  BP: (!) 170/96 (!) 158/108 (!) 174/104 (!) 152/87  Pulse: (!) 111 (!) 111 (!) 103 (!) 112  Resp: (!) 25 (!) 25 (!) 22 (!) 21  Temp:    99 F (37.2 C)  TempSrc:    Oral  SpO2: 96% 96% 99% 94%  Weight:      Height:       Eyes: lids and conjunctivae normal ENMT: Mucous membranes are moist.  Neck: normal, supple Respiratory: clear to auscultation bilaterally. Normal respiratory effort. No accessory muscle use. Currently on RA. Cardiovascular: Regular rate and rhythm, no murmurs. No extremity edema. Abdomen: RLQ tenderness, no distention. Bowel sounds positive.  Musculoskeletal:  No joint deformity upper and lower extremities.   Skin: no rashes, lesions, ulcers.  Psychiatric: Normal judgment and insight. Alert and oriented x 3. Normal mood.   Labs on Admission: I have personally reviewed following labs and imaging studies  CBC: Recent Labs  Lab 03/06/17 2128 03/07/17 0427 03/08/17 0501 03/11/17 0041  WBC 12.5* 10.8* 11.5* 6.6  NEUTROABS 8.9* 8.5* 9.2* 4.0  HGB 8.2* 8.0* 8.0* 8.5*  HCT 28.0* 27.0* 27.3* 28.2*  MCV 88.3 87.1 86.4 85.7  PLT 500* 485* 407* 981*   Basic Metabolic Panel: Recent Labs  Lab 03/06/17 2128 03/07/17 0427 03/08/17 0501 03/11/17 0041  NA 140 139 140 139  K 4.8 4.4 4.2 4.7  CL 110 108 107 116*  CO2 20* 22 20* 15*  GLUCOSE 109* 102* 107* 107*  BUN 39* 35* 29* 42*  CREATININE 1.63* 1.37* 1.34* 1.41*  CALCIUM 8.7* 8.6* 8.7* 8.2*   GFR: Estimated Creatinine Clearance: 47.9 mL/min (A) (by C-G formula based on SCr of 1.41 mg/dL (H)). Liver Function Tests: Recent Labs  Lab 03/06/17 2128 03/07/17 0427 03/08/17 0501 03/11/17 0041  AST 28 29 73* 42*  ALT 60* 54 111* 79*  ALKPHOS 154* 139* 155* 168*  BILITOT 0.5 0.6 0.6 0.6  PROT 6.1* 5.7* 5.9* 6.2*  ALBUMIN 3.0* 2.9* 2.9* 2.9*   No results for input(s): LIPASE, AMYLASE in the last 168 hours. No results for  input(s): AMMONIA in the last 168 hours. Coagulation Profile: No results for input(s): INR, PROTIME in the last 168 hours. Cardiac Enzymes: No results for input(s): CKTOTAL, CKMB, CKMBINDEX, TROPONINI in the last 168 hours. BNP (last 3 results) No results for input(s): PROBNP in the last 8760 hours. HbA1C: No results for input(s): HGBA1C in the last 72 hours. CBG: No results for input(s): GLUCAP in the last 168 hours. Lipid Profile: No results for input(s): CHOL, HDL, LDLCALC, TRIG, CHOLHDL, LDLDIRECT in the last 72 hours. Thyroid Function Tests: No results for input(s): TSH, T4TOTAL, FREET4, T3FREE, THYROIDAB in the last 72 hours. Anemia Panel: No results for input(s): VITAMINB12, FOLATE, FERRITIN, TIBC, IRON, RETICCTPCT in the last 72 hours. Urine analysis:    Component Value Date/Time   COLORURINE STRAW (A) 03/11/2017 0222   APPEARANCEUR CLEAR 03/11/2017 0222   LABSPEC 1.012 03/11/2017 0222   PHURINE 5.0 03/11/2017 0222   GLUCOSEU NEGATIVE 03/11/2017 0222   HGBUR NEGATIVE 03/11/2017 0222   BILIRUBINUR NEGATIVE 03/11/2017 0222   KETONESUR NEGATIVE 03/11/2017 0222   PROTEINUR NEGATIVE 03/11/2017 0222   UROBILINOGEN 0.2 05/28/2013 0206   NITRITE NEGATIVE 03/11/2017 0222   LEUKOCYTESUR NEGATIVE 03/11/2017 0222    Radiological Exams on Admission: Ct Abdomen Pelvis Wo Contrast  Result Date: 03/11/2017 CLINICAL DATA:  Diarrhea and fever since discharge from [facility] on Wednesday. Symptoms worse today. Multiple organ transplants in July. EXAM: CT ABDOMEN AND PELVIS WITHOUT CONTRAST TECHNIQUE: Multidetector CT imaging of the abdomen and pelvis was performed following the standard protocol without IV contrast. COMPARISON:  07/02/2016 FINDINGS: Lower chest: Lung bases are clear. Mild cardiac enlargement with minimal pericardial effusion. Hepatobiliary: Liver configuration and parenchymal pattern are homogeneous and normal. Presumably this represents an interval liver  transplants since previous study. No focal lesions identified. Gallbladder is absent. Pancreas: Limited visualization of the pancreas. Visualized portion appears intact. Spleen: Spleen is surgically absent. Adrenals/Urinary Tract: No adrenal gland nodules. Multiple calcifications throughout the right kidney, largest in the upper pole measuring 9 mm in diameter. No hydronephrosis or hydroureter. No ureteral stones identified. Left kidney in ureter and the bladder are unremarkable. Stomach/Bowel: Stomach is decompressed. Small bowel are mostly decompressed. Colon is diffusely stool-filled. No definite evidence of wall thickening although abdominal fluid limits evaluation of the bowel wall on unenhanced scan. Prominent mesenteric vascularity is nonspecific and could represent varices. Visualization is limited due to mesenteric fluid. Scattered diverticula in the sigmoid colon. Vascular/Lymphatic: No aortic aneurysm. Scattered aortic calcifications. No significant lymphadenopathy. Reproductive: Status post hysterectomy. No adnexal masses. Other: There is diffuse free fluid throughout the abdomen and pelvis and throughout the mesentery. Hounsfield unit measurements are consistent with non hemorrhagic fluid. No free air in the abdomen. Postoperative changes over the anterior abdominal wall. Surgical clips in the abdomen. Musculoskeletal: No acute or significant osseous findings. Unenhanced CT was performed per order. Lack of IV contrast limits sensitivity and specificity, especially for evaluation of abdominal/pelvic solid viscera. IMPRESSION: 1. There appears to been a liver transplant since the previous study. Resection of the spleen. Possible pancreatic surgery. Gallbladder is absent. 2. Large amount of free fluid throughout the abdomen and pelvis and in the mesentery. No free air. This is likely due to ascites. 3. No definite evidence of bowel obstruction or inflammation although evaluation of the bowel wall is  limited due to the large amount of fluid. 4. Nonobstructing stones in the right kidney similar to prior study. Electronically Signed   By: Lucienne Capers M.D.   On:  03/11/2017 02:22   Dg Chest 2 View  Result Date: 03/11/2017 CLINICAL DATA:  Sepsis. EXAM: CHEST  2 VIEW COMPARISON:  Radiograph of March 06, 2017. FINDINGS: Stable cardiomediastinal silhouette. No pneumothorax or pleural effusion is noted. Both lungs are clear. The visualized skeletal structures are unremarkable. IMPRESSION: No active cardiopulmonary disease. Electronically Signed   By: Marijo Conception, M.D.   On: 03/11/2017 01:55    EKG: Independently reviewed. Sinus tachycardia rate 108 bpm.  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 Sepsis of GI source in the setting of multiple GI organ transplant and immunosuppression.  This could be likely due to colitis versus possible SBP which may require paracentesis for further evaluation.  In the meantime, we will arrange for transfer to [facility] as recommended by GI here for further evaluation by ID and GI there as she awaits her final transfer to [facility] once a bed is available. Continue on IV Zosyn and Vancomycin as ordered and await culture results. Order GI panel. Cdiff here has been negative. Stable for monitoring on step down unit. Continue on gentle IVF. Continue home medications as prescribed and maintain on CLD to advance as tolerated. 2. Ascites. Might need paracentesis for SBP evaluation. 3. Anemia. Currently stable; continue to monitor. 4. Depression/anxiety. Continue home meds.   DVT prophylaxis: SCDs Code Status: DNR Family Communication: None Disposition Plan:[place] SDU while awaiting transfer to [facility] [employer] called:EDP spoke with GI Dr. Oneida Alar who recommends GI and ID evaluation  at [facility]; also spoke with Dr. Colonel Bald ([facility]) who has accepted pt, but no beds available at this time (patient on list for transfer). Admission status: Inpatient, SDU   Pratik Darleen Crocker DO [facility] Pager [phone]  If 7PM-7AM, please contact night-coverage [email or web address] Password [facility]  03/11/2017, 5:40 AM

## 2017-03-11 NOTE — ED Triage Notes (Signed)
Pt c/o diarrhea and fever since being discharged from [facility] on Wednesday, sx worse today. Pt had multiple organ transplants in July.

## 2017-03-11 NOTE — Progress Notes (Signed)
Pt refuses the bed alarm. Pt educated. Pt is A&O x4 and promises to call every time she needs to get up.

## 2017-03-11 NOTE — Progress Notes (Signed)
[name] is a 57 y.o. female patient admitted from ED awake, alert - oriented  X 4 - no acute distress noted.  VSS - Blood pressure (!) 173/98, pulse 98, temperature 99 F (37.2 C), temperature source Oral, resp. rate 19, height 5\' 6"  (1.676 m), weight 79.4 kg (175 lb), SpO2 94 %.    IV in place, occlusive dsg intact without redness.  Orientation to room, and floor completed with information packet given to patient/family.  Patient declined safety video at this time.  Admission INP armband ID verified with patient/family, and in place.   SR up x 2, fall assessment complete, with patient and family able to verbalize understanding of risk associated with falls, and verbalized understanding to call nsg before up out of bed.  Call light within reach, patient able to voice, and demonstrate understanding.  Skin, clean-dry- intact without evidence of bruising, or skin tears.   No evidence of skin break down noted on exam.     Will cont to eval and treat per MD orders.  Celine Ahr, RN 03/11/2017 4:00 PM

## 2017-03-11 NOTE — ED Notes (Signed)
Called [facility] for transport to [facility] rm [id number].

## 2017-03-11 NOTE — ED Notes (Signed)
Pt c/o headache .  Rates 8-9/10.  Hospitalist called and notified of headache.  Orders received.

## 2017-03-11 NOTE — ED Provider Notes (Signed)
[facility] EMERGENCY DEPARTMENT Provider Note   CSN: [id number] Arrival date & time: 03/11/17  0007     History   Chief Complaint Chief Complaint  Patient presents with  . Diarrhea    HPI [name] is a 57 y.o. female.  57 y.o. 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  States she is much worse since discharge.  She was diagnosed with a upper respiratory infection and respiratory panel was positive for coronavirus.  States she has had 5 episodes of diarrhea today with dry heaves and nausea.  Decreased p.o. intake.  Still having fevers at home up to 103.  Has been able to take her immunosuppressants.  Hasright-sided lower abdominal pain that is worse with palpation.  States she has diffuse body aches, chills, abdominal pain and diarrhea.  She was not discharged on any antibiotics.  She denies any cough. She was seen by transplant clinic on 1/31 and "everything was ok".    The history is provided by the patient and the EMS personnel.  Diarrhea   Associated symptoms include abdominal pain, headaches, arthralgias and myalgias. Pertinent negatives include no vomiting and no cough.    Past Medical History:  Diagnosis Date  . Anemia   . Anxiety   . Ascites   . Cirrhosis ([place])    non alcoholic  . Complication of anesthesia   . Depression   . Diabetes mellitus type 2, controlled, without complications ([place])   . Enteropathy 07/31/2015   Portal hypertensive enteropathy per capsule study, with stigmata of bleeding.  . Esophageal varices ([place])   . Gastric ulcer 07/15/2015   per EGD; no stigmata of bleeding  . GI bleed 07/17/2015  . Gram-negative bacteremia 12/08/2011  . NFAOZHYQ(657.8)    "monthly" (11/07/2012)  . Heart murmur   . Hepatitis C    Hx: of Hep "C" it was eradicated  . History of hepatitis C - successfuly treated medically 07/14/2015  . Kidney stones   . Liver failure ([place])    "I'm on liver transplant list @ [facility]" (11/07/2012)  . PONV (postoperative nausea and vomiting)   . Portal vein thrombosis   . Renal insufficiency   . Right ureteral stone 12/07/2011  . 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 Type II diabetes mellitus ([place])    "not since gastric bypass" (11/07/2012)    Patient Active Problem List   Diagnosis Date Noted  . Influenza-like illness 03/08/2017  . Acute bronchitis, viral 03/08/2017  . Anemia 03/06/2017  . Headache(784.0) 03/06/2017  . Elevated blood pressure reading 03/06/2017  . Liver transplant recipient [facility]) 03/06/2017  . History of pancreas transplant ([place]) 03/06/2017  . Acute viral syndrome 03/06/2017  . Pancytopenia ([place]) 11/13/2015  . Cirrhosis of liver with ascites ([place])   . Abdominal pain 11/10/2015  . Alcoholic cirrhosis of liver with ascites ([place])   . Enteropathy 07/31/2015  . Anemia, blood loss   . Depression with anxiety 07/30/2015  . Esophageal varices in cirrhosis ([place]) 07/30/2015  . Gastritis determined by endoscopy 07/30/2015  . Hypotension  07/30/2015  . Hyponatremia 07/29/2015  . Hypokalemia 07/29/2015  . GI bleeding 07/29/2015  . Gastric ulcer 07/29/2015  . Leukopenia 07/29/2015  . AKI (acute kidney injury) ([place]) 07/29/2015  . Diabetes mellitus type 2, controlled, without complications ([place]) 21/30/8657  . GI bleed 07/17/2015  . Palliative care encounter   . Goals of care, counseling/discussion   . DNR (do not resuscitate) discussion   . Superior  mesenteric vein thrombosis 07/14/2015  . Melena 07/14/2015  . Hepatic cirrhosis due to chronic hepatitis C infection ([place]) 07/14/2015  . History of hepatitis C - successfuly treated medically 07/14/2015  . Portal vein thrombosis 05/13/2014  . Other malaise and fatigue 08/05/2013  . Hypoxemia 08/05/2013  . Abdominal pain, right upper quadrant 05/29/2013  . SBP (spontaneous bacterial peritonitis) ([place]) 05/28/2013  . Distal radius fracture 10/25/2012  . Metatarsal bone fracture 10/25/2012  . Insomnia 09/25/2012  . Hepatic encephalopathy ([place]) 06/07/2012  . Hyperglycemia 06/06/2012  . LUQ abdominal pain 06/05/2012  . Ascites 05/14/2012  . Gram-negative bacteremia 12/08/2011  . Pyelonephritis 12/07/2011  . Hydronephrosis of right kidney 12/07/2011  . Right ureteral stone 12/07/2011  . Thrombocytopenia ([place]) 12/07/2011  . Cirrhosis of liver ([place]) 05/02/2011  . Acute blood loss anemia 05/02/2011    Past Surgical History:  Procedure Laterality Date  . ABDOMINAL HYSTERECTOMY  2003  . CESAREAN SECTION  1995  . CHOLECYSTECTOMY  1987  . COLONOSCOPY    . COLONOSCOPY  10/21/2011   Procedure: COLONOSCOPY;  Surgeon: Rogene Houston, MD;  Location: [facility];  Service: Endoscopy;  Laterality: N/A;  245   . CYSTOSCOPY W/ RETROGRADES  12/08/2011   Procedure: CYSTOSCOPY WITH RETROGRADE PYELOGRAM;  Surgeon: Marissa Nestle, MD;  Location: [facility] ORS;  Service: Urology;  Laterality: Right;  . CYSTOSCOPY WITH STENT PLACEMENT  12/08/2011   Procedure: CYSTOSCOPY WITH STENT PLACEMENT;  Surgeon: Marissa Nestle, MD;  Location: [facility] OR[facility];  Service: Urology;  Laterality: Right;  . DILATION AND CURETTAGE OF UTERUS    . ESOPHAGOGASTRODUODENOSCOPY N/A 02/19/2014   Procedure: ESOPHAGOGASTRODUODENOSCOPY (EGD);  Surgeon: Rogene Houston, MD;  Location: [facility];  Service: Endoscopy;  Laterality: N/A;  100  . ESOPHAGOGASTRODUODENOSCOPY N/A 07/15/2015   Procedure: ESOPHAGOGASTRODUODENOSCOPY (EGD);  Surgeon:  Rogene Houston, MD;  Location[facility];  Service: Endoscopy;  Laterality: N/A;  . ESOPHAGOGASTRODUODENOSCOPY N/A 07/29/2015   Procedure: ESOPHAGOGASTRODUODENOSCOPY (EGD);  Surgeon: Rogene Houston, MD;  Location: [facility];  Service: Endoscopy;  Laterality: N/A;  . GASTRIC BYPASS  ~ 1995  . HERNIA REPAIR  04/14/11   "one in my bellybutton" (11/07/2012)  . INGUINAL HERNIA REPAIR Right    "maybe 2" (11/07/2012)  . KNEE ARTHROSCOPY Right   . LITHOTRIPSY     "several times" (11/07/2012)  . OPEN REDUCTION INTERNAL FIXATION (ORIF) DISTAL RADIAL FRACTURE Right 11/07/2012   Procedure: OPEN REDUCTION INTERNAL FIXATION (ORIF) RIGHT DISTAL RADIAL FRACTURE;  Surgeon: [name], MD;  Location: [place];  Service: Orthopedics;  Laterality: Right;  . ORIF DISTAL RADIUS FRACTURE Right 11/07/2012  . OTHER SURGICAL HISTORY  08/14/2016   5 organ transplant- liver, stomach, pancreas, small and large intestine  . UPPER GASTROINTESTINAL ENDOSCOPY      OB History    Gravida Para Term Preterm AB Living   1 1 1          SAB TAB Ectopic Multiple Live Births  Home Medications    Prior to Admission medications   Medication Sig Start Date End Date Taking? Authorizing Provider  aspirin EC 81 MG tablet Take 81 mg by mouth daily.    [provider]  calcium-vitamin D (OSCAL WITH D) 500-200 MG-UNIT tablet Take 2 tablets by mouth 2 (two) times daily.    [provider]  cycloSPORINE modified (GENGRAF) 25 MG capsule Take 300 mg by mouth every 12 (twelve) hours. Gengraf    [provider]  DULoxetine (CYMBALTA) 60 MG capsule Take 60 mg by mouth daily.    [provider]  magnesium oxide (MAG-OX) 400 MG tablet Take 1,200 mg by mouth 2 (two) times daily.    [provider]  mycophenolate (CELLCEPT) 250 MG capsule Take 1,000 mg by mouth every 12 (twelve) hours.    [provider]  nortriptyline (PAMELOR) 50 MG capsule Take 50 mg by mouth at  bedtime.    [provider]  pantoprazole (PROTONIX) 40 MG tablet Take 1 tablet (40 mg total) by mouth 2 (two) times daily before a meal. 11/17/16   Rehman, Mechele Dawley, MD  predniSONE (DELTASONE) 5 MG tablet Take 7.5 mg by mouth daily with breakfast.    [provider]  prochlorperazine (COMPAZINE) 10 MG tablet Take 10 mg by mouth every 8 (eight) hours as needed for nausea or vomiting.    [provider]  QUEtiapine (SEROQUEL) 50 MG tablet Take 50 mg by mouth every 8 (eight) hours.    [provider]  sodium bicarbonate 650 MG tablet Take 1,300 mg by mouth 2 (two) times daily.    [provider]  sulfamethoxazole-trimethoprim (BACTRIM DS,SEPTRA DS) 800-160 MG tablet Take 1 tablet by mouth every Monday, Wednesday, and Friday.    [provider]    Family History Family History  Problem Relation Age of Onset  . Dementia Mother   . Heart disease Father   . Liver disease Father   . Hypertension Father   . Diabetes Father   . Dementia Father   . Hypertension Brother   . Other Son        Cervical dystonia    Social History Social History   Tobacco Use  . Smoking status: Former Smoker    Packs/day: 0.50    Years: 5.00    Pack years: 2.50    Types: Cigarettes    Last attempt to quit: 05/02/1991    Years since quitting: 25.8  . Smokeless tobacco: Never Used  Substance Use Topics  . Alcohol use: No    Alcohol/week: 0.0 oz  . Drug use: No     Allergies   Ancef [cefazolin sodium]; Tapentadol; Gadobenate; Iodinated diagnostic agents; and Tape   Review of Systems Review of Systems  Constitutional: Positive for activity change, appetite change, fatigue and fever.  Respiratory: Negative for cough and shortness of breath.   Gastrointestinal: Positive for abdominal pain, diarrhea and nausea. Negative for vomiting.  Genitourinary: Negative for vaginal bleeding and vaginal discharge.  Musculoskeletal: Positive for arthralgias and  myalgias.  Skin: Negative for rash.  Neurological: Positive for weakness and headaches. Negative for dizziness.   all other systems are negative except as noted in the HPI and PMH.    Physical Exam Updated Vital Signs BP (!) 154/94   Pulse (!) 117   Temp (!) 102 F (38.9 C)   Resp (!) 28   Ht 5\' 6"  (1.676 m)   Wt 79.4 kg (175 lb)   SpO2  98%   BMI 28.25 kg/m   Physical Exam  Constitutional: She is oriented to person, place, and time. She appears well-developed and well-nourished. No distress.  Obese, chronically ill-appearing  HENT:  Head: Normocephalic and atraumatic.  Mouth/Throat: Oropharynx is clear and moist. No oropharyngeal exudate.  Dry mucous membranes  Eyes: Conjunctivae and EOM are normal. Pupils are equal, round, and reactive to light.  Neck: Normal range of motion. Neck supple.  No meningismus.  Cardiovascular: Normal rate, normal heart sounds and intact distal pulses.  No murmur heard. Regular tachycardia  Pulmonary/Chest: Effort normal and breath sounds normal. No respiratory distress.  Abdominal: Soft. There is tenderness. There is guarding. There is no rebound.  Abdomen is soft, there is right-sided lower abdominal tenderness with voluntary guarding.  Surgical incisions are well-healed.  There is a small area of erythema where patient's ostomy used to be  Musculoskeletal: Normal range of motion. She exhibits no edema or tenderness.  Neurological: She is alert and oriented to person, place, and time. No cranial nerve deficit. She exhibits normal muscle tone. Coordination normal.  No ataxia on finger to nose bilaterally. No pronator drift. 5/5 strength throughout. CN 2-12 intact.Equal grip strength. Sensation intact.   Skin: Skin is warm. Capillary refill takes less than 2 seconds.  Psychiatric: She has a normal mood and affect. Her behavior is normal.  Nursing note and vitals reviewed.    ED Treatments / Results  Labs (all labs ordered are listed, but  only abnormal results are displayed) Labs Reviewed  COMPREHENSIVE METABOLIC PANEL - Abnormal; Notable for the following components:      Result Value   Chloride 116 (*)    CO2 15 (*)    Glucose, Bld 107 (*)    BUN 42 (*)    Creatinine, Ser 1.41 (*)    Calcium 8.2 (*)    Total Protein 6.2 (*)    Albumin 2.9 (*)    AST 42 (*)    ALT 79 (*)    Alkaline Phosphatase 168 (*)    GFR calc non Af Amer 41 (*)    GFR calc Af Amer 48 (*)    All other components within normal limits  CBC WITH DIFFERENTIAL/PLATELET - Abnormal; Notable for the following components:   RBC 3.29 (*)    Hemoglobin 8.5 (*)    HCT 28.2 (*)    MCH 25.8 (*)    RDW 16.3 (*)    Platelets 479 (*)    Lymphs Abs 0.2 (*)    Monocytes Absolute 1.5 (*)    Eosinophils Absolute 0.9 (*)    All other components within normal limits  URINALYSIS, ROUTINE W REFLEX MICROSCOPIC - Abnormal; Notable for the following components:   Color, Urine STRAW (*)    All other components within normal limits  C DIFFICILE QUICK SCREEN W PCR REFLEX  CULTURE, BLOOD (ROUTINE X 2)  CULTURE, BLOOD (ROUTINE X 2)  URINE CULTURE  GASTROINTESTINAL PANEL BY PCR, STOOL (REPLACES STOOL CULTURE)  I-STAT CG4 LACTIC ACID, ED    EKG  EKG Interpretation  Date/Time:  Saturday March 11 2017 01:15:48 EST Ventricular Rate:  108 PR Interval:    QRS Duration: 89 QT Interval:  320 QTC Calculation: 429 R Axis:   55 Text Interpretation:  Sinus tachycardia Atrial premature complex Rate faster Nonspecific T wave abnormality Confirmed by Ezequiel Essex [phone]) on 03/11/2017 5:04:37 AM       Radiology Ct Abdomen Pelvis Wo Contrast  Result Date: 03/11/2017 CLINICAL DATA:  Diarrhea and fever since discharge from [facility] on Wednesday. Symptoms worse today. Multiple organ transplants in July. EXAM: CT ABDOMEN AND PELVIS WITHOUT CONTRAST TECHNIQUE: Multidetector CT imaging of the abdomen and pelvis was performed following the standard protocol  without IV contrast. COMPARISON:  07/02/2016 FINDINGS: Lower chest: Lung bases are clear. Mild cardiac enlargement with minimal pericardial effusion. Hepatobiliary: Liver configuration and parenchymal pattern are homogeneous and normal. Presumably this represents an interval liver transplants since previous study. No focal lesions identified. Gallbladder is absent. Pancreas: Limited visualization of the pancreas. Visualized portion appears intact. Spleen: Spleen is surgically absent. Adrenals/Urinary Tract: No adrenal gland nodules. Multiple calcifications throughout the right kidney, largest in the upper pole measuring 9 mm in diameter. No hydronephrosis or hydroureter. No ureteral stones identified. Left kidney in ureter and the bladder are unremarkable. Stomach/Bowel: Stomach is decompressed. Small bowel are mostly decompressed. Colon is diffusely stool-filled. No definite evidence of wall thickening although abdominal fluid limits evaluation of the bowel wall on unenhanced scan. Prominent mesenteric vascularity is nonspecific and could represent varices. Visualization is limited due to mesenteric fluid. Scattered diverticula in the sigmoid colon. Vascular/Lymphatic: No aortic aneurysm. Scattered aortic calcifications. No significant lymphadenopathy. Reproductive: Status post hysterectomy. No adnexal masses. Other: There is diffuse free fluid throughout the abdomen and pelvis and throughout the mesentery. Hounsfield unit measurements are consistent with non hemorrhagic fluid. No free air in the abdomen. Postoperative changes over the anterior abdominal wall. Surgical clips in the abdomen. Musculoskeletal: No acute or significant osseous findings. Unenhanced CT was performed per order. Lack of IV contrast limits sensitivity and specificity, especially for evaluation of abdominal/pelvic solid viscera. IMPRESSION: 1. There appears to been a liver transplant since the previous study. Resection of the spleen. Possible  pancreatic surgery. Gallbladder is absent. 2. Large amount of free fluid throughout the abdomen and pelvis and in the mesentery. No free air. This is likely due to ascites. 3. No definite evidence of bowel obstruction or inflammation although evaluation of the bowel wall is limited due to the large amount of fluid. 4. Nonobstructing stones in the right kidney similar to prior study. Electronically Signed   By: Lucienne Capers M.D.   On: 03/11/2017 02:22   Dg Chest 2 View  Result Date: 03/11/2017 CLINICAL DATA:  Sepsis. EXAM: CHEST  2 VIEW COMPARISON:  Radiograph of March 06, 2017. FINDINGS: Stable cardiomediastinal silhouette. No pneumothorax or pleural effusion is noted. Both lungs are clear. The visualized skeletal structures are unremarkable. IMPRESSION: No active cardiopulmonary disease. Electronically Signed   By: Marijo Conception, M.D.   On: 03/11/2017 01:55    Procedures Procedures (including critical care time)  Medications Ordered in ED Medications  piperacillin-tazobactam (ZOSYN) IVPB 3.375 g (not administered)  vancomycin (VANCOCIN) IVPB 1000 mg/200 mL premix (not administered)  sodium chloride 0.9 % bolus 1,000 mL (1,000 mLs Intravenous New Bag/Given 03/11/17 0048)     Initial Impression / Assessment and Plan / ED Course  I have reviewed the triage vital signs and the nursing notes.  Pertinent labs & imaging results that were available during my care of the patient were reviewed by me and considered in my medical decision making (see chart for details).    Patient with multiorgan transplant, immunosuppression presenting with fever, chills, body aches, abdominal pain and diarrhea.  Recent discharge for sepsis and found to have coronavirus on respiratory panel.  Presents with worsening diarrhea and abdominal pain, febrile and tachycardic.  Code sepsis activated.  Patient given IV  fluids and broad-spectrum antibiotics after blood cultures obtained.  CT abdomen obtained given her  abdominal pain.  She does not have any peritoneal signs.  Though SBP is on the differential.  Lactate is normal.  Liver and kidney function are stable. CT scan shows ascites without other acute pathology.  Mental status and BP remain stable in the ED. Lactate and WBC normal.  Stable LFTs and Cr.  C dif negative. GI pathogen panel pending.   Discussed with [facility] transplant Dr. Colonel Bald.  He agrees with fluids and antibiotics at this point.  [facility] is on diversion and has no beds.  Recommends admission to this hospital.  Patient may need paracentesis tomorrow to rule out SBP.  D/w Dr. Manuella Ghazi hospitalist service.  He requests that I speak with Dr. Oneida Alar of gastroenterology prior to admitting patient.  Dr. Oneida Alar states patient should go where ID coverage is available.  She agrees with workup for diarrhea and sepsis and stool studies.  Patient may also need paracentesis.  As there are no beds available at [facility], will plan admission to [facility] where GI and ID are available.  CRITICAL CARE Performed by: Ezequiel Essex Total critical care time: 60 minutes Critical care time was exclusive of separately billable procedures and treating other patients. Critical care was necessary to treat or prevent imminent or life-threatening deterioration. Critical care was time spent personally by me on the following activities: development of treatment plan with patient and/or surrogate as well as nursing, discussions with consultants, evaluation of patient's response to treatment, examination of patient, obtaining history from patient or surrogate, ordering and performing treatments and interventions, ordering and review of laboratory studies, ordering and review of radiographic studies, pulse oximetry and re-evaluation of patient's condition.   Final Clinical Impressions(s) / ED Diagnoses   Final diagnoses:  Sepsis, due to unspecified organism [facility])  Immunosuppressed status [facility])    ED Discharge Orders      None       Antavious Spanos, Annie Main, MD 03/11/17 [phone]

## 2017-03-11 NOTE — ED Notes (Signed)
Patient transported to CT 

## 2017-03-12 ENCOUNTER — Encounter (HOSPITAL_COMMUNITY): Payer: Self-pay | Admitting: Infectious Diseases

## 2017-03-12 DIAGNOSIS — Z9489 Other transplanted organ and tissue status: Secondary | ICD-10-CM

## 2017-03-12 DIAGNOSIS — Z944 Liver transplant status: Secondary | ICD-10-CM

## 2017-03-12 DIAGNOSIS — N183 Chronic kidney disease, stage 3 (moderate): Secondary | ICD-10-CM

## 2017-03-12 DIAGNOSIS — A0811 Acute gastroenteropathy due to Norwalk agent: Secondary | ICD-10-CM

## 2017-03-12 DIAGNOSIS — Z8619 Personal history of other infectious and parasitic diseases: Secondary | ICD-10-CM

## 2017-03-12 DIAGNOSIS — Z87891 Personal history of nicotine dependence: Secondary | ICD-10-CM

## 2017-03-12 DIAGNOSIS — Z9483 Pancreas transplant status: Secondary | ICD-10-CM

## 2017-03-12 LAB — PROTIME-INR
INR: 1.19
Prothrombin Time: 15 seconds (ref 11.4–15.2)

## 2017-03-12 LAB — COMPREHENSIVE METABOLIC PANEL
ALBUMIN: 2.7 g/dL — AB (ref 3.5–5.0)
ALT: 48 U/L (ref 14–54)
AST: 17 U/L (ref 15–41)
Alkaline Phosphatase: 133 U/L — ABNORMAL HIGH (ref 38–126)
Anion gap: 12 (ref 5–15)
BILIRUBIN TOTAL: 0.8 mg/dL (ref 0.3–1.2)
BUN: 20 mg/dL (ref 6–20)
CHLORIDE: 112 mmol/L — AB (ref 101–111)
CO2: 15 mmol/L — ABNORMAL LOW (ref 22–32)
CREATININE: 1.49 mg/dL — AB (ref 0.44–1.00)
Calcium: 8.7 mg/dL — ABNORMAL LOW (ref 8.9–10.3)
GFR calc Af Amer: 45 mL/min — ABNORMAL LOW (ref >60)
GFR calc non Af Amer: 38 mL/min — ABNORMAL LOW (ref >60)
GLUCOSE: 87 mg/dL (ref 65–99)
POTASSIUM: 4.2 mmol/L (ref 3.5–5.1)
Sodium: 139 mmol/L (ref 135–145)
Total Protein: 5.3 g/dL — ABNORMAL LOW (ref 6.5–8.1)

## 2017-03-12 LAB — CBC WITH DIFFERENTIAL/PLATELET
BASOS PCT: 0 %
Band Neutrophils: 0 %
Basophils Absolute: 0 10*3/uL (ref 0.0–0.1)
Blasts: 0 %
EOS PCT: 6 %
Eosinophils Absolute: 0.4 10*3/uL (ref 0.0–0.7)
HCT: 25.9 % — ABNORMAL LOW (ref 36.0–46.0)
Hemoglobin: 7.9 g/dL — ABNORMAL LOW (ref 12.0–15.0)
LYMPHS ABS: 0.1 10*3/uL — AB (ref 0.7–4.0)
LYMPHS PCT: 2 %
MCH: 25.8 pg — ABNORMAL LOW (ref 26.0–34.0)
MCHC: 30.5 g/dL (ref 30.0–36.0)
MCV: 84.6 fL (ref 78.0–100.0)
MONO ABS: 0.8 10*3/uL (ref 0.1–1.0)
Metamyelocytes Relative: 0 %
Monocytes Relative: 12 %
Myelocytes: 0 %
NEUTROS ABS: 5.6 10*3/uL (ref 1.7–7.7)
NEUTROS PCT: 80 %
NRBC: 0 /100{WBCs}
OTHER: 0 %
PLATELETS: 393 10*3/uL (ref 150–400)
Promyelocytes Absolute: 0 %
RBC: 3.06 MIL/uL — ABNORMAL LOW (ref 3.87–5.11)
RDW: 16.9 % — AB (ref 11.5–15.5)
WBC: 6.9 10*3/uL (ref 4.0–10.5)

## 2017-03-12 LAB — URINE CULTURE: CULTURE: NO GROWTH

## 2017-03-12 MED ORDER — ONDANSETRON HCL 4 MG PO TABS: 4.0000 mg | ORAL_TABLET | Freq: Four times a day (QID) | ORAL | 0 refills | Status: DC | PRN

## 2017-03-12 MED ORDER — LOPERAMIDE HCL 2 MG PO CAPS: 2.0000 mg | ORAL_CAPSULE | Freq: Two times a day (BID) | ORAL | 0 refills | Status: DC | PRN

## 2017-03-12 MED ORDER — LOPERAMIDE HCL 2 MG PO CAPS: 2.0000 mg | ORAL_CAPSULE | Freq: Two times a day (BID) | ORAL | Status: DC | PRN

## 2017-03-12 NOTE — Progress Notes (Signed)
Report given to [facility] RN. ETA 11pm

## 2017-03-12 NOTE — Progress Notes (Signed)
Report called to [facility] at this time. [place] Rm 2113 RN Abby. Accepting MD Mervin Kung. Called [facility] to power share pt images per [facility] request. Also contacted [place] for patient transport per [facility].

## 2017-03-12 NOTE — Consult Note (Signed)
[name] for Infectious Disease    Date of Admission:  03/11/2017   Total days of antibiotics: 1 vanco/zosyn              Reason for Consult: immunosuppression, diarrhea (norovirus)    Referring Provider: Burgess Estelle   Assessment: Fever, n/v, diarrhea- norovirus Coronavirus infection (respiratory) 03-06-17  Liver/pancrease/stomach/small & large bowel transplant (08-14-16 [facility])  Mycopenolate, prednisone, cyclosporine Treated hepatitis C CKD 3 (GFR 36)  Plan: 1. Stop anbx 2. Continue isolation 3. Check CSA level  4. Await [place] transfer  Thank you so much for this interesting consult,  Principal Problem:   Sepsis ([place]) Active Problems:   Ascites   Depression with anxiety   Abdominal pain   Anemia   Liver transplant recipient 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 aspirin EC  81 mg Oral Daily  . calcium-vitamin D  2 tablet Oral BID  . cycloSPORINE modified  300 mg Oral Q12H  . DULoxetine  60 mg Oral Daily  . magnesium oxide  1,200 mg Oral BID  . mycophenolate  1,000 mg Oral Q12H  . nortriptyline  50 mg Oral QHS  . pantoprazole  40 mg Oral BID AC  . predniSONE  7.5 mg Oral Q breakfast  . QUEtiapine  50 mg Oral Q8H  . sodium bicarbonate  1,300 mg Oral BID  . [START ON 03/13/2017] sulfamethoxazole-trimethoprim  1 tablet Oral Q M,W,F    HPI: [name] is a 57 y.o. female with a hx of treated Hep C, small/large bowel/pancreas/liver/stomach transplant (08-2016 [facility]) and recent hospitalization for coronavirus respiratory illness (03-06-17).  After d/c from hospital she was seen at [facility] transplant clinic 1-31: CSA level 371, EBV DNA (-), CMV pending.  By 2-1 she developed watery diarrhea (6 episodes) with RLQ and epigastric pain. Her CT in ED [facility]) did not show inflammation or obstruction, it did show ascites. She had a temp 102 and WBC was normal. C diff was (-).   [facility] was contacted for transfer and she was sent to [facility]  for further eval while awaiting a bed at [facility].  She was started on vanco/zosyn.  BCx pending, UCx (-).   She denies (GI) sick contacts.  Her Cyclosporine dose was recently changed due to elevated level.  Has been able to eat today, still having loose BM Had pain with swallowing sprite today  Review of Systems: Review of Systems  Constitutional: Negative for chills and fever.  HENT: Positive for sore throat.   Gastrointestinal: Positive for diarrhea and vomiting.  Genitourinary: Negative for dysuria.    Past Medical History:  Diagnosis Date  . Anemia   . Anxiety   . Ascites   . 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 Complication of anesthesia   . Depression   . Diabetes mellitus type 2, controlled, without complications ([place])   . Enteropathy 07/31/2015   Portal hypertensive enteropathy per capsule study, with stigmata of bleeding.  . Esophageal varices ([place])   . Gastric ulcer 07/15/2015   per EGD; no stigmata of bleeding  . GI bleed 07/17/2015  . Gram-negative bacteremia 12/08/2011  . XBMWUXLK(440.1)    "monthly" (11/07/2012)  . Heart murmur   . Hepatitis C    Hx: of Hep "C" it was eradicated  . History of hepatitis C - successfuly treated medically 07/14/2015  . Kidney stones   . Liver  failure ([place])    "I'm on liver transplant list @ [facility]" (11/07/2012)  . PONV (postoperative nausea and vomiting)   . Portal vein thrombosis   . Renal insufficiency   . Right ureteral stone 12/07/2011  . 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 Type II diabetes mellitus ([place])    "not since gastric bypass" (11/07/2012)    Social History   Tobacco Use  . Smoking status: Former Smoker    Packs/day: 0.50    Years: 5.00    Pack years: 2.50    Types: Cigarettes    Last attempt to quit: 05/02/1991    Years since quitting: 25.8  . Smokeless tobacco: Never Used  Substance Use Topics  . Alcohol use: No    Alcohol/week: 0.0 oz  . Drug use: No    Family History  Problem Relation Age of  Onset  . Dementia Mother   . Heart disease Father   . Liver disease Father   . Hypertension Father   . Diabetes Father   . Dementia Father   . Hypertension Brother   . Other Son        Cervical dystonia     Medications:  Scheduled: . aspirin EC  81 mg Oral Daily  . calcium-vitamin D  2 tablet Oral BID  . cycloSPORINE modified  300 mg Oral Q12H  . DULoxetine  60 mg Oral Daily  . magnesium oxide  1,200 mg Oral BID  . mycophenolate  1,000 mg Oral Q12H  . nortriptyline  50 mg Oral QHS  . pantoprazole  40 mg Oral BID AC  . predniSONE  7.5 mg Oral Q breakfast  . QUEtiapine  50 mg Oral Q8H  . sodium bicarbonate  1,300 mg Oral BID  . [START ON 03/13/2017] sulfamethoxazole-trimethoprim  1 tablet Oral Q M,W,F    Abtx:  Anti-infectives (From admission, onward)   Start     Dose/Rate Route Frequency Ordered Stop   03/13/17 1000  sulfamethoxazole-trimethoprim (BACTRIM DS,SEPTRA DS) 800-160 MG per tablet 1 tablet     1 tablet Oral Every M-W-F 03/11/17 0609     03/11/17 1000  vancomycin (VANCOCIN) IVPB 750 mg/150 ml premix     750 mg 150 mL/hr over 60 Minutes Intravenous Every 12 hours 03/11/17 0848     03/11/17 0730  piperacillin-tazobactam (ZOSYN) IVPB 3.375 g     3.375 g 12.5 mL/hr over 240 Minutes Intravenous Every 8 hours 03/11/17 0727     03/11/17 0030  piperacillin-tazobactam (ZOSYN) IVPB 3.375 g     3.375 g 100 mL/hr over 30 Minutes Intravenous  Once 03/11/17 0022 03/11/17 0150   03/11/17 0030  vancomycin (VANCOCIN) IVPB 1000 mg/200 mL premix     1,000 mg 200 mL/hr over 60 Minutes Intravenous  Once 03/11/17 0022 03/11/17 0218        OBJECTIVE: Blood pressure 137/75, pulse 83, temperature 97.7 F (36.5 C), temperature source Oral, resp. rate 16, height '5\' 6"'$  (1.676 m), weight 82.7 kg (182 lb 5.1 oz), SpO2 98 %.  Physical Exam  Constitutional: She is oriented to person, place, and time and well-developed, well-nourished, and in no distress. No distress.  HENT:    Mouth/Throat: No oropharyngeal exudate.  Eyes: EOM are normal. Pupils are equal, round, and reactive to light.  Neck: Neck supple.  Cardiovascular: Normal rate, regular rhythm and normal heart sounds.  Pulmonary/Chest: Effort normal and breath sounds normal.  Abdominal: Soft. Bowel sounds are normal. There is no tenderness. There is no rebound.  Musculoskeletal: She exhibits no edema.  Lymphadenopathy:    She has no cervical adenopathy.  Neurological: She is alert and oriented to person, place, and time.  Skin: She is not diaphoretic.  Psychiatric: Mood normal.    Lab Results Results for orders placed or performed during the hospital encounter of 03/11/17 (from the past 48 hour(s))  Gastrointestinal Panel by PCR , Stool     Status: Abnormal   Collection Time: 03/11/17 12:14 AM  Result Value Ref Range   Campylobacter species NOT DETECTED NOT DETECTED   Plesimonas shigelloides NOT DETECTED NOT DETECTED   Salmonella species NOT DETECTED NOT DETECTED   Yersinia enterocolitica NOT DETECTED NOT DETECTED   Vibrio species NOT DETECTED NOT DETECTED   Vibrio cholerae NOT DETECTED NOT DETECTED   Enteroaggregative E coli (EAEC) NOT DETECTED NOT DETECTED   Enteropathogenic E coli (EPEC) NOT DETECTED NOT DETECTED   Enterotoxigenic E coli (ETEC) NOT DETECTED NOT DETECTED   Shiga like toxin producing E coli (STEC) NOT DETECTED NOT DETECTED   Shigella/Enteroinvasive E coli (EIEC) NOT DETECTED NOT DETECTED   Cryptosporidium NOT DETECTED NOT DETECTED   Cyclospora cayetanensis NOT DETECTED NOT DETECTED   Entamoeba histolytica NOT DETECTED NOT DETECTED   Giardia lamblia NOT DETECTED NOT DETECTED   Adenovirus F40/41 NOT DETECTED NOT DETECTED   Astrovirus NOT DETECTED NOT DETECTED   Norovirus GI/GII DETECTED (A) NOT DETECTED    Comment: RESULT CALLED TO, READ BACK BY AND VERIFIED WITH: JUSTIN MIZE AT 1936 03/11/17.PMH    Rotavirus A NOT DETECTED NOT DETECTED   Sapovirus (I, II, IV, and V) NOT  DETECTED NOT DETECTED    Comment: Performed at [facility], [address] [place], [place] [address]  C difficile quick scan w PCR reflex     Status: None   Collection Time: 03/11/17 12:14 AM  Result Value Ref Range   C Diff antigen NEGATIVE NEGATIVE   C Diff toxin NEGATIVE NEGATIVE   C Diff interpretation No C. difficile detected.     Comment: VALID Performed at [facility], [address] [place], [place] [address]   Comprehensive metabolic panel     Status: Abnormal   Collection Time: 03/11/17 12:41 AM  Result Value Ref Range   Sodium 139 135 - 145 mmol/L   Potassium 4.7 3.5 - 5.1 mmol/L   Chloride 116 (H) 101 - 111 mmol/L   CO2 15 (L) 22 - 32 mmol/L   Glucose, Bld 107 (H) 65 - 99 mg/dL   BUN 42 (H) 6 - 20 mg/dL   Creatinine, Ser 1.41 (H) 0.44 - 1.00 mg/dL   Calcium 8.2 (L) 8.9 - 10.3 mg/dL   Total Protein 6.2 (L) 6.5 - 8.1 g/dL   Albumin 2.9 (L) 3.5 - 5.0 g/dL   AST 42 (H) 15 - 41 U/L   ALT 79 (H) 14 - 54 U/L   Alkaline Phosphatase 168 (H) 38 - 126 U/L   Total Bilirubin 0.6 0.3 - 1.2 mg/dL   GFR calc non Af Amer 41 (L) >60 mL/min   GFR calc Af Amer 48 (L) >60 mL/min    Comment: (NOTE) The eGFR has been calculated using the CKD EPI equation. This calculation has not been validated in all clinical situations. eGFR's persistently <60 mL/min signify possible Chronic Kidney Disease.    Anion gap 8 5 - 15    Comment: Performed at [facility], [address] [place], [place] [address]  CBC WITH DIFFERENTIAL     Status:  Abnormal   Collection Time: 03/11/17 12:41 AM  Result Value Ref Range   WBC 6.6 4.0 - 10.5 K/uL   RBC 3.29 (L) 3.87 - 5.11 MIL/uL   Hemoglobin 8.5 (L) 12.0 - 15.0 g/dL   HCT 28.2 (L) 36.0 - 46.0 %   MCV 85.7 78.0 - 100.0 fL   MCH 25.8 (L) 26.0 - 34.0 pg   MCHC 30.1 30.0 - 36.0 g/dL   RDW 16.3 (H) 11.5 - 15.5 %   Platelets 479 (H) 150 - 400 K/uL   Neutrophils Relative % 60 %   Lymphocytes Relative 3 %   Monocytes Relative 23 %    Eosinophils Relative 14 %   Basophils Relative 0 %   Band Neutrophils 0 %   Metamyelocytes Relative 0 %   Myelocytes 0 %   Promyelocytes Absolute 0 %   Blasts 0 %   nRBC 0 0 /100 WBC   Other 0 %   Neutro Abs 4.0 1.7 - 7.7 K/uL   Lymphs Abs 0.2 (L) 0.7 - 4.0 K/uL   Monocytes Absolute 1.5 (H) 0.1 - 1.0 K/uL   Eosinophils Absolute 0.9 (H) 0.0 - 0.7 K/uL   Basophils Absolute 0.0 0.0 - 0.1 K/uL    Comment: Performed at [facility], [address], [place], [place] [address]  Blood Culture (routine x 2)     Status: None (Preliminary result)   Collection Time: 03/11/17 12:41 AM  Result Value Ref Range   Specimen Description BLOOD RIGHT ARM    Special Requests      BOTTLES DRAWN AEROBIC AND ANAEROBIC Blood Culture adequate volume   Culture      NO GROWTH 1 DAY Performed at [facility], [address], [place], [place] [address]    Report Status PENDING   Blood Culture (routine x 2)     Status: None (Preliminary result)   Collection Time: 03/11/17  1:00 AM  Result Value Ref Range   Specimen Description BLOOD RIGHT HAND    Special Requests      BOTTLES DRAWN AEROBIC AND ANAEROBIC Blood Culture adequate volume   Culture      NO GROWTH 1 DAY Performed at [facility], [address], [place], [place] [address]    Report Status PENDING   I-Stat CG4 Lactic Acid, ED  (not at  [facility])     Status: None   Collection Time: 03/11/17  1:09 AM  Result Value Ref Range   Lactic Acid, Venous 0.89 0.5 - 1.9 mmol/L  Urinalysis, Routine w reflex microscopic     Status: Abnormal   Collection Time: 03/11/17  2:22 AM  Result Value Ref Range   Color, Urine STRAW (A) YELLOW   APPearance CLEAR CLEAR   Specific Gravity, Urine 1.012 1.005 - 1.030   pH 5.0 5.0 - 8.0   Glucose, UA NEGATIVE NEGATIVE mg/dL   Hgb urine dipstick NEGATIVE NEGATIVE   Bilirubin Urine NEGATIVE NEGATIVE   Ketones, ur NEGATIVE NEGATIVE mg/dL   Protein, ur NEGATIVE NEGATIVE mg/dL   Nitrite NEGATIVE NEGATIVE   Leukocytes, UA  NEGATIVE NEGATIVE    Comment: Performed at [facility], [address], [place], [place] [address]  Urine culture     Status: None   Collection Time: 03/11/17  2:22 AM  Result Value Ref Range   Specimen Description      URINE, CLEAN CATCH Performed at [facility], [address], [place], [place] [address]    Special Requests  NONE Performed at [facility], [address] [place], [place] [address]    Culture      NO GROWTH Performed at [facility] [address] [place], [place] [address]    Report Status 03/12/2017 FINAL   Comprehensive metabolic panel     Status: Abnormal   Collection Time: 03/11/17  8:28 AM  Result Value Ref Range   Sodium 138 135 - 145 mmol/L   Potassium 4.2 3.5 - 5.1 mmol/L   Chloride 110 101 - 111 mmol/L   CO2 16 (L) 22 - 32 mmol/L   Glucose, Bld 89 65 - 99 mg/dL   BUN 31 (H) 6 - 20 mg/dL   Creatinine, Ser 1.38 (H) 0.44 - 1.00 mg/dL   Calcium 8.4 (L) 8.9 - 10.3 mg/dL   Total Protein 6.1 (L) 6.5 - 8.1 g/dL   Albumin 2.9 (L) 3.5 - 5.0 g/dL   AST 30 15 - 41 U/L   ALT 70 (H) 14 - 54 U/L   Alkaline Phosphatase 158 (H) 38 - 126 U/L   Total Bilirubin 0.6 0.3 - 1.2 mg/dL   GFR calc non Af Amer 42 (L) >60 mL/min   GFR calc Af Amer 49 (L) >60 mL/min    Comment: (NOTE) The eGFR has been calculated using the CKD EPI equation. This calculation has not been validated in all clinical situations. eGFR's persistently <60 mL/min signify possible Chronic Kidney Disease.    Anion gap 12 5 - 15    Comment: Performed at [facility], [address] [place], [place] [address]  CBC with Differential/Platelet     Status: Abnormal   Collection Time: 03/11/17  8:28 AM  Result Value Ref Range   WBC 6.4 4.0 - 10.5 K/uL   RBC 3.25 (L) 3.87 - 5.11 MIL/uL   Hemoglobin 8.3 (L) 12.0 - 15.0 g/dL   HCT 28.1 (L) 36.0 - 46.0 %   MCV 86.5 78.0 - 100.0 fL   MCH 25.5 (L) 26.0 - 34.0 pg   MCHC 29.5 (L) 30.0 - 36.0 g/dL   RDW 16.2 (H) 11.5 - 15.5 %   Platelets 470  (H) 150 - 400 K/uL   Neutrophils Relative % 74 %   Neutro Abs 4.7 1.7 - 7.7 K/uL   Lymphocytes Relative 9 %   Lymphs Abs 0.6 (L) 0.7 - 4.0 K/uL   Monocytes Relative 9 %   Monocytes Absolute 0.6 0.1 - 1.0 K/uL   Eosinophils Relative 8 %   Eosinophils Absolute 0.5 0.0 - 0.7 K/uL   Basophils Relative 0 %   Basophils Absolute 0.0 0.0 - 0.1 K/uL    Comment: Performed at [facility], [address], [place], [place] [address]  Magnesium     Status: Abnormal   Collection Time: 03/11/17  8:28 AM  Result Value Ref Range   Magnesium 1.4 (L) 1.7 - 2.4 mg/dL    Comment: Performed at [facility], [address], [place], [place] [address]  Phosphorus     Status: None   Collection Time: 03/11/17  8:28 AM  Result Value Ref Range   Phosphorus 4.2 2.5 - 4.6 mg/dL    Comment: Performed at [facility], [address] [place], [place] [address]  Comprehensive metabolic panel     Status: Abnormal   Collection Time: 03/12/17  8:40 AM  Result Value Ref Range   Sodium 139 135 - 145 mmol/L   Potassium 4.2 3.5 - 5.1 mmol/L   Chloride 112 (H) 101 -  111 mmol/L   CO2 15 (L) 22 - 32 mmol/L   Glucose, Bld 87 65 - 99 mg/dL   BUN 20 6 - 20 mg/dL   Creatinine, Ser 1.49 (H) 0.44 - 1.00 mg/dL   Calcium 8.7 (L) 8.9 - 10.3 mg/dL   Total Protein 5.3 (L) 6.5 - 8.1 g/dL   Albumin 2.7 (L) 3.5 - 5.0 g/dL   AST 17 15 - 41 U/L   ALT 48 14 - 54 U/L   Alkaline Phosphatase 133 (H) 38 - 126 U/L   Total Bilirubin 0.8 0.3 - 1.2 mg/dL   GFR calc non Af Amer 38 (L) >60 mL/min   GFR calc Af Amer 45 (L) >60 mL/min    Comment: (NOTE) The eGFR has been calculated using the CKD EPI equation. This calculation has not been validated in all clinical situations. eGFR's persistently <60 mL/min signify possible Chronic Kidney Disease.    Anion gap 12 5 - 15    Comment: Performed at [facility] [address], [place], [place] [address]  Protime-INR     Status: None   Collection Time: 03/12/17  8:40 AM  Result Value Ref  Range   Prothrombin Time 15.0 11.4 - 15.2 seconds   INR 1.19     Comment: Performed at [facility] [address], [place], [place] [address]  CBC with Differential/Platelet     Status: Abnormal   Collection Time: 03/12/17  8:40 AM  Result Value Ref Range   WBC 6.9 4.0 - 10.5 K/uL    Comment: REPEATED TO VERIFY WHITE COUNT CONFIRMED ON SMEAR    RBC 3.06 (L) 3.87 - 5.11 MIL/uL   Hemoglobin 7.9 (L) 12.0 - 15.0 g/dL   HCT 25.9 (L) 36.0 - 46.0 %   MCV 84.6 78.0 - 100.0 fL   MCH 25.8 (L) 26.0 - 34.0 pg   MCHC 30.5 30.0 - 36.0 g/dL   RDW 16.9 (H) 11.5 - 15.5 %   Platelets 393 150 - 400 K/uL    Comment: REPEATED TO VERIFY SPECIMEN CHECKED FOR CLOTS PLATELET COUNT CONFIRMED BY SMEAR    Neutrophils Relative % 80 %   Lymphocytes Relative 2 %   Monocytes Relative 12 %   Eosinophils Relative 6 %   Basophils Relative 0 %   Band Neutrophils 0 %   Metamyelocytes Relative 0 %   Myelocytes 0 %   Promyelocytes Absolute 0 %   Blasts 0 %   nRBC 0 0 /100 WBC   Other 0 %   Neutro Abs 5.6 1.7 - 7.7 K/uL   Lymphs Abs 0.1 (L) 0.7 - 4.0 K/uL   Monocytes Absolute 0.8 0.1 - 1.0 K/uL   Eosinophils Absolute 0.4 0.0 - 0.7 K/uL   Basophils Absolute 0.0 0.0 - 0.1 K/uL   RBC Morphology POLYCHROMASIA PRESENT     Comment: TARGET CELLS ELLIPTOCYTES BURR CELLS Performed at [facility], [address], [place], [place] [address]       Component Value Date/Time   SDES  03/11/2017 0222    URINE, CLEAN CATCH Performed at [facility], [address], [place], [place] [address]    [facility]  03/11/2017 0222    NONE Performed at [facility], [address], [place], [place] [address]    CULT  03/11/2017 0222    NO GROWTH Performed at [facility] [address], [place], [place] [address]    REPTSTATUS 03/12/2017 FINAL 03/11/2017 0222  Ct Abdomen Pelvis Wo Contrast  Result Date: 03/11/2017 CLINICAL DATA:  Diarrhea and fever since discharge from [facility] on  Wednesday. Symptoms worse today. Multiple organ transplants in July. EXAM: CT ABDOMEN AND PELVIS WITHOUT CONTRAST TECHNIQUE: Multidetector CT imaging of the abdomen and pelvis was performed following the standard protocol without IV contrast. COMPARISON:  07/02/2016 FINDINGS: Lower chest: Lung bases are clear. Mild cardiac enlargement with minimal pericardial effusion. Hepatobiliary: Liver configuration and parenchymal pattern are homogeneous and normal. Presumably this represents an interval liver transplants since previous study. No focal lesions identified. Gallbladder is absent. Pancreas: Limited visualization of the pancreas. Visualized portion appears intact. Spleen: Spleen is surgically absent. Adrenals/Urinary Tract: No adrenal gland nodules. Multiple calcifications throughout the right kidney, largest in the upper pole measuring 9 mm in diameter. No hydronephrosis or hydroureter. No ureteral stones identified. Left kidney in ureter and the bladder are unremarkable. Stomach/Bowel: Stomach is decompressed. Small bowel are mostly decompressed. Colon is diffusely stool-filled. No definite evidence of wall thickening although abdominal fluid limits evaluation of the bowel wall on unenhanced scan. Prominent mesenteric vascularity is nonspecific and could represent varices. Visualization is limited due to mesenteric fluid. Scattered diverticula in the sigmoid colon. Vascular/Lymphatic: No aortic aneurysm. Scattered aortic calcifications. No significant lymphadenopathy. Reproductive: Status post hysterectomy. No adnexal masses. Other: There is diffuse free fluid throughout the abdomen and pelvis and throughout the mesentery. Hounsfield unit measurements are consistent with non hemorrhagic fluid. No free air in the abdomen. Postoperative changes over the anterior abdominal wall. Surgical clips in the abdomen. Musculoskeletal: No acute or significant osseous findings. Unenhanced CT was performed per order. Lack of IV  contrast limits sensitivity and specificity, especially for evaluation of abdominal/pelvic solid viscera. IMPRESSION: 1. There appears to been a liver transplant since the previous study. Resection of the spleen. Possible pancreatic surgery. Gallbladder is absent. 2. Large amount of free fluid throughout the abdomen and pelvis and in the mesentery. No free air. This is likely due to ascites. 3. No definite evidence of bowel obstruction or inflammation although evaluation of the bowel wall is limited due to the large amount of fluid. 4. Nonobstructing stones in the right kidney similar to prior study. Electronically Signed   By: Lucienne Capers M.D.   On: 03/11/2017 02:22   Dg Chest 2 View  Result Date: 03/11/2017 CLINICAL DATA:  Sepsis. EXAM: CHEST  2 VIEW COMPARISON:  Radiograph of March 06, 2017. FINDINGS: Stable cardiomediastinal silhouette. No pneumothorax or pleural effusion is noted. Both lungs are clear. The visualized skeletal structures are unremarkable. IMPRESSION: No active cardiopulmonary disease. Electronically Signed   By: Marijo Conception, M.D.   On: 03/11/2017 01:55   Recent Results (from the past 240 hour(s))  Blood Culture (routine x 2)     Status: None   Collection Time: 03/06/17  9:28 PM  Result Value Ref Range Status   Specimen Description LEFT ANTECUBITAL  Final   Special Requests   Final    BOTTLES DRAWN AEROBIC AND ANAEROBIC Blood Culture adequate volume   Culture   Final    NO GROWTH 5 DAYS Performed at [facility], [address], [place], [place] [address]    Report Status 03/11/2017 FINAL  Final  Blood Culture (routine x 2)     Status: None   Collection Time: 03/06/17  9:35 PM  Result Value Ref Range Status   Specimen Description RIGHT ANTECUBITAL  Final   Special Requests   Final    BOTTLES DRAWN  AEROBIC AND ANAEROBIC Blood Culture adequate volume   Culture   Final    NO GROWTH 5 DAYS Performed at [facility], [address], [place], [place] [address]     Report Status 03/11/2017 FINAL  Final  Respiratory Panel by PCR     Status: Abnormal   Collection Time: 03/06/17 10:15 PM  Result Value Ref Range Status   Adenovirus NOT DETECTED NOT DETECTED Final   Coronavirus 229E NOT DETECTED NOT DETECTED Final   Coronavirus HKU1 NOT DETECTED NOT DETECTED Final   Coronavirus NL63 NOT DETECTED NOT DETECTED Final   Coronavirus OC43 DETECTED (A) NOT DETECTED Final   Metapneumovirus NOT DETECTED NOT DETECTED Final   Rhinovirus / Enterovirus NOT DETECTED NOT DETECTED Final   Influenza A NOT DETECTED NOT DETECTED Final   Influenza B NOT DETECTED NOT DETECTED Final   Parainfluenza Virus 1 NOT DETECTED NOT DETECTED Final   Parainfluenza Virus 2 NOT DETECTED NOT DETECTED Final   Parainfluenza Virus 3 NOT DETECTED NOT DETECTED Final   Parainfluenza Virus 4 NOT DETECTED NOT DETECTED Final   Respiratory Syncytial Virus NOT DETECTED NOT DETECTED Final   Bordetella pertussis NOT DETECTED NOT DETECTED Final   Chlamydophila pneumoniae NOT DETECTED NOT DETECTED Final   Mycoplasma pneumoniae NOT DETECTED NOT DETECTED Final    Comment: Performed at [facility], [place] [address], [place], [place] [address]  Gastrointestinal Panel by PCR , Stool     Status: Abnormal   Collection Time: 03/11/17 12:14 AM  Result Value Ref Range Status   Campylobacter species NOT DETECTED NOT DETECTED Final   Plesimonas shigelloides NOT DETECTED NOT DETECTED Final   Salmonella species NOT DETECTED NOT DETECTED Final   Yersinia enterocolitica NOT DETECTED NOT DETECTED Final   Vibrio species NOT DETECTED NOT DETECTED Final   Vibrio cholerae NOT DETECTED NOT DETECTED Final   Enteroaggregative E coli (EAEC) NOT DETECTED NOT DETECTED Final   Enteropathogenic E coli (EPEC) NOT DETECTED NOT DETECTED Final   Enterotoxigenic E coli (ETEC) NOT DETECTED NOT DETECTED Final   Shiga like toxin producing E coli (STEC) NOT DETECTED NOT DETECTED Final   Shigella/Enteroinvasive E coli (EIEC) NOT  DETECTED NOT DETECTED Final   Cryptosporidium NOT DETECTED NOT DETECTED Final   Cyclospora cayetanensis NOT DETECTED NOT DETECTED Final   Entamoeba histolytica NOT DETECTED NOT DETECTED Final   Giardia lamblia NOT DETECTED NOT DETECTED Final   Adenovirus F40/41 NOT DETECTED NOT DETECTED Final   Astrovirus NOT DETECTED NOT DETECTED Final   Norovirus GI/GII DETECTED (A) NOT DETECTED Final    Comment: RESULT CALLED TO, READ BACK BY AND VERIFIED WITH: JUSTIN MIZE AT 1936 03/11/17.PMH    Rotavirus A NOT DETECTED NOT DETECTED Final   Sapovirus (I, II, IV, and V) NOT DETECTED NOT DETECTED Final    Comment: Performed at [facility], [address] [place], [place] [address]  C difficile quick scan w PCR reflex     Status: None   Collection Time: 03/11/17 12:14 AM  Result Value Ref Range Status   C Diff antigen NEGATIVE NEGATIVE Final   C Diff toxin NEGATIVE NEGATIVE Final   C Diff interpretation No C. difficile detected.  Final    Comment: VALID Performed at [facility], [address], [place], [place] [address]   Blood Culture (routine x 2)     Status: None (Preliminary result)   Collection Time: 03/11/17 12:41 AM  Result Value Ref Range Status   Specimen Description BLOOD RIGHT  ARM  Final   Special Requests   Final    BOTTLES DRAWN AEROBIC AND ANAEROBIC Blood Culture adequate volume   Culture   Final    NO GROWTH 1 DAY Performed at [facility], [address] [place], [place] [address]    Report Status PENDING  Incomplete  Blood Culture (routine x 2)     Status: None (Preliminary result)   Collection Time: 03/11/17  1:00 AM  Result Value Ref Range Status   Specimen Description BLOOD RIGHT HAND  Final   Special Requests   Final    BOTTLES DRAWN AEROBIC AND ANAEROBIC Blood Culture adequate volume   Culture   Final    NO GROWTH 1 DAY Performed at [facility], [address] [place], [place] [address]    Report Status PENDING  Incomplete  Urine culture     Status: None    Collection Time: 03/11/17  2:22 AM  Result Value Ref Range Status   Specimen Description   Final    URINE, CLEAN CATCH Performed at [facility], [address], [place], [place] [address]    Special Requests   Final    NONE Performed at [facility], [address], [place], [place] [address]    Culture   Final    NO GROWTH Performed at [facility], [place] [address], [place], [place] [address]    Report Status 03/12/2017 FINAL  Final    Microbiology: Recent Results (from the past 240 hour(s))  Blood Culture (routine x 2)     Status: None   Collection Time: 03/06/17  9:28 PM  Result Value Ref Range Status   Specimen Description LEFT ANTECUBITAL  Final   Special Requests   Final    BOTTLES DRAWN AEROBIC AND ANAEROBIC Blood Culture adequate volume   Culture   Final    NO GROWTH 5 DAYS Performed at [facility], [address] [place],  [address]    Report Status 03/11/2017 FINAL  Final  Blood Culture (routine x 2)     Status: None   Collection Time: 03/06/17  9:35 PM  Result Value Ref Range Status   Specimen Description RIGHT ANTECUBITAL  Final   Special Requests   Final    BOTTLES DRAWN AEROBIC AND ANAEROBIC Blood Culture adequate volume   Culture   Final    NO GROWTH 5 DAYS Performed at [facility], [address], [place],  [address]    Report Status 03/11/2017 FINAL  Final  Respiratory Panel by PCR     Status: Abnormal   Collection Time: 03/06/17 10:15 PM  Result Value Ref Range Status   Adenovirus NOT DETECTED NOT DETECTED Final   Coronavirus 229E NOT DETECTED NOT DETECTED Final   Coronavirus HKU1 NOT DETECTED NOT DETECTED Final   Coronavirus NL63 NOT DETECTED NOT DETECTED Final   Coronavirus OC43 DETECTED (A) NOT DETECTED Final   Metapneumovirus NOT DETECTED NOT DETECTED Final   Rhinovirus / Enterovirus NOT DETECTED NOT DETECTED Final   Influenza A NOT DETECTED NOT DETECTED Final   Influenza B NOT DETECTED NOT DETECTED Final   Parainfluenza  Virus 1 NOT DETECTED NOT DETECTED Final   Parainfluenza Virus 2 NOT DETECTED NOT DETECTED Final   Parainfluenza Virus 3 NOT DETECTED NOT DETECTED Final   Parainfluenza Virus 4 NOT DETECTED NOT DETECTED Final   Respiratory Syncytial Virus NOT DETECTED NOT DETECTED Final   Bordetella pertussis NOT DETECTED NOT DETECTED Final   Chlamydophila pneumoniae NOT DETECTED  NOT DETECTED Final   Mycoplasma pneumoniae NOT DETECTED NOT DETECTED Final    Comment: Performed at [facility], [place] [address], [place], [place] [address]  Gastrointestinal Panel by PCR , Stool     Status: Abnormal   Collection Time: 03/11/17 12:14 AM  Result Value Ref Range Status   Campylobacter species NOT DETECTED NOT DETECTED Final   Plesimonas shigelloides NOT DETECTED NOT DETECTED Final   Salmonella species NOT DETECTED NOT DETECTED Final   Yersinia enterocolitica NOT DETECTED NOT DETECTED Final   Vibrio species NOT DETECTED NOT DETECTED Final   Vibrio cholerae NOT DETECTED NOT DETECTED Final   Enteroaggregative E coli (EAEC) NOT DETECTED NOT DETECTED Final   Enteropathogenic E coli (EPEC) NOT DETECTED NOT DETECTED Final   Enterotoxigenic E coli (ETEC) NOT DETECTED NOT DETECTED Final   Shiga like toxin producing E coli (STEC) NOT DETECTED NOT DETECTED Final   Shigella/Enteroinvasive E coli (EIEC) NOT DETECTED NOT DETECTED Final   Cryptosporidium NOT DETECTED NOT DETECTED Final   Cyclospora cayetanensis NOT DETECTED NOT DETECTED Final   Entamoeba histolytica NOT DETECTED NOT DETECTED Final   Giardia lamblia NOT DETECTED NOT DETECTED Final   Adenovirus F40/41 NOT DETECTED NOT DETECTED Final   Astrovirus NOT DETECTED NOT DETECTED Final   Norovirus GI/GII DETECTED (A) NOT DETECTED Final    Comment: RESULT CALLED TO, READ BACK BY AND VERIFIED WITH: JUSTIN MIZE AT 1936 03/11/17.PMH    Rotavirus A NOT DETECTED NOT DETECTED Final   Sapovirus (I, II, IV, and V) NOT DETECTED NOT DETECTED Final    Comment: Performed at  [facility], [address] [place], [place] [address]  C difficile quick scan w PCR reflex     Status: None   Collection Time: 03/11/17 12:14 AM  Result Value Ref Range Status   C Diff antigen NEGATIVE NEGATIVE Final   C Diff toxin NEGATIVE NEGATIVE Final   C Diff interpretation No C. difficile detected.  Final    Comment: VALID Performed at [facility], [address], [place], [place] [address]   Blood Culture (routine x 2)     Status: None (Preliminary result)   Collection Time: 03/11/17 12:41 AM  Result Value Ref Range Status   Specimen Description BLOOD RIGHT ARM  Final   Special Requests   Final    BOTTLES DRAWN AEROBIC AND ANAEROBIC Blood Culture adequate volume   Culture   Final    NO GROWTH 1 DAY Performed at [facility], [address] [place] [address] [place], [place] [address]    Report Status PENDING  Incomplete  Blood Culture (routine x 2)     Status: None (Preliminary result)   Collection Time: 03/11/17  1:00 AM  Result Value Ref Range Status   Specimen Description BLOOD RIGHT HAND  Final   Special Requests   Final    BOTTLES DRAWN AEROBIC AND ANAEROBIC Blood Culture adequate volume   Culture   Final    NO GROWTH 1 DAY Performed at [facility] [place] [facility], [address] [place], [place] [address]    Report Status PENDING  Incomplete  Urine culture     Status: None   Collection Time: 03/11/17  2:22 AM  Result Value Ref Range Status   Specimen Description   Final    URINE, CLEAN CATCH Performed at [facility], [address] [place],  [address]    Special Requests   Final    NONE Performed at [facility], [address] [place],  [place] [address]    Culture   Final    NO GROWTH Performed at [facility], [place] [address], [place],  [address]    Report Status 03/12/2017 FINAL  Final    Radiographs and labs were personally reviewed by me.   Bobby Rumpf, MD [facility] [place]  [facility] [phone] 03/12/2017, 1:47 PM

## 2017-03-12 NOTE — Progress Notes (Signed)
Received call from [place], transfer at [facility]. She asked for more info on the pt. Bed placement will call once they have a room. Will continue to assess.

## 2017-03-12 NOTE — Progress Notes (Signed)
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  patient was recently discharged from the hospital after a stay from 03/06/17 through 03/08/17.  During that admission, but the patient was admitted for influenza-like illness.  Influenza PCR was negative, but respiratory virus panel showed coronavirus.  Although the patient had a low-grade temperature of 100.2 F on the first day of admission, she remained afebrile throughout the rest of that hospitalization.  She was also noted to have acute kidney injury secondary to dehydration which improved with IV fluid resuscitation.  The patient was discharged in stable condition, she followed up at the [facility] transplant clinic on 03/09/2017.  EBV DNA was negative at that time.  There were no new changes at her transplant clinic visit.   The patient had 6 episodes of watery stools on 03/10/2017 without any hematochezia or melena.  She had some associated right lower quadrant abdominal pain and epigastric pain.  There was no hematemesis.  The patient denied any chest pain, shortness breath, dysuria, hematuria, vaginal discharge.  She continues to complain of her chronic daily headache without any visual disturbance or focal neurologic deficits.  CT abdomen and pelvis in the ED showed a stool-filled colon without any bowel wall thickening although this was confounded by the patient's  ascites which limited visualization of any bowel wall thickening.  Nevertheless, there were no other acute abnormalities.  In the ED, the patient was initially febrile with a temperature 102.19F and tachycardic in 110s, but remained hemodynamically stable saturating 95-99% on room air.  EDP spoke with the transplant, Dr.Dr. Colonel Bald who recommended transfer to [facility].  However there were no beds available.  EDP also spoke with gastroenterology, Dr. Barney Drain, who felt patient needed transfer to [facility] while waiting for a bed at [facility].   Assessment/Plan: SIRS -pt presented with fever and tachycardia -work up in progress for underlying infection--[place] and uc pending -continue vancomycin and zosyn -lactic acid 0.99 -UA neg for pyuria -personally reviewed CXR--neg for edema or infiltrate -03/09/17 EBV DNA negative -0.45% NACL at 75 cc/h--see below -narrow off abx in 24 hours once cult results become available  Diarrhea -02/24/2017 colonoscopy-- biopsies negative for rejection or infectious process -02/24/2017 liver biopsy--negative for rejection or infectious process -03/11/17--Cdiff negative -03/11/17--Stool pathogen shows noroviral illness -check stool for cryptosporidium, isospora, microsporidium -check CMV DNA -03/09/17 EBV DNA negative  Dehydration -Due to volume depletion -Baseline creatinine 1.0-1.2 -repeat CMP, creat ~ 1.3 now  Transaminasemia -02/24/17 liver biopsy neg for rejection -02/24/17 colon and SB biopsy neg for infection or rejection -AST/ALT/ALP mildly elevated at time of d/c and 03/09/17-->trending down -03/09/17--EBV DNA--neg -await CMV DNA  Chronic daily headache -She saw Neurology Dr. Melrose Nakayama at [facility] on 02/06/17. At that time, he reviewed the brain MRI performed in early December 2018 and found no evidence of malignancy or spread of donor cancer. He started Nortriptyline at 10 mg for one week and then increase to 20 mg thereafter and wants  to see her back at the end of  January to assess response to this therapy.  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   Graft contains donor stomach, duodenum, pancreas, small bowel, right colon and liver  -Continue cyclosporine, CellCept, prednisone -Continue TMP/SMZ prophylaxis -she had a dose reduction of her Cell cept to 200 bid as her Cellcept levels were 371 [usually tried to keep this around 200]  We will discuss with [facility] dosing and timing of repeat levels and might adjust the same-I believe she may be too immunosuppressed which is the cause for these recurrent viral illnesses    Disposition Plan:   Awaiting transfer to [facility];  Family Communication:  No Family at bedside -Total time spent 45 minutes.  Greater than 50% spent face to face counseling and coordinating care. 0700 to 0745   Consultants:  GI  Code Status:  FULL  DVT Prophylaxis:  Lovenox   Procedures: As Listed in Progress Note Above  Antibiotics: vanco 2/1>>> Zosyn 2/1>>>    Subjective:   Objective: Vitals:   03/11/17 1612 03/11/17 2144 03/12/17 0500 03/12/17 0546  BP: (!) 145/72 137/84  137/75  Pulse: 95 88  83  Resp: 20 18  16   Temp: 97.8 F (36.6 C) 97.8 F (36.6 C)  97.7 F (36.5 C)  TempSrc: Oral Oral  Oral  SpO2: 95% 98%  98%  Weight: 87.3 kg (192 lb 6.4 oz)  82.7 kg (182 lb 5.1 oz)   Height:        Intake/Output Summary (Last 24 hours) at 03/12/2017 0829 Last data filed at 03/11/2017 1840 Gross per 24 hour  Intake 1063.75 ml  Output -  Net 1063.75 ml   Weight change: 7.893 kg (17 lb 6.4 oz) Exam:  Awake alert pleasant in nad eomi ncat s1 s 2no m/r/g abd soft slight tedner in RLQ but no rebound nor  guard nuero intact. Moving 4 limbs equally no decfiits to strghnth   Data Reviewed: I have personally reviewed following labs and imaging studies Basic Metabolic Panel: Recent Labs  Lab 03/06/17 2128 03/07/17 0427 03/08/17 0501 03/11/17 0041 03/11/17 0828  NA 140 139 140 139 138  K 4.8 4.4 4.2 4.7 4.2  CL 110 108 107 116* 110  CO2 20* 22 20* 15* 16*  GLUCOSE 109* 102* 107* 107* 89  BUN 39* 35* 29* 42* 31*  CREATININE 1.63* 1.37* 1.34* 1.41* 1.38*  CALCIUM 8.7* 8.6* 8.7* 8.2* 8.4*  MG  --   --   --   --  1.4*  PHOS  --   --   --   --  4.2   Liver Function Tests: Recent Labs  Lab 03/06/17 2128 03/07/17 0427 03/08/17 0501 03/11/17 0041 03/11/17 0828  AST 28 29 73* 42* 30  ALT 60* 54 111* 79* 70*  ALKPHOS 154* 139* 155* 168* 158*  BILITOT 0.5 0.6 0.6 0.6 0.6  PROT 6.1* 5.7* 5.9* 6.2* 6.1*  ALBUMIN 3.0* 2.9* 2.9* 2.9* 2.9*   No results for input(s): LIPASE, AMYLASE in the last 168 hours. No results for input(s): AMMONIA in the last 168 hours. Coagulation Profile: No results for input(s): INR, PROTIME in the last 168 hours. CBC: Recent Labs  Lab 03/06/17 2128 03/07/17  5621 03/08/17 0501 03/11/17 0041 03/11/17 0828  WBC 12.5* 10.8* 11.5* 6.6 6.4  NEUTROABS 8.9* 8.5* 9.2* 4.0 4.7  HGB 8.2* 8.0* 8.0* 8.5* 8.3*  HCT 28.0* 27.0* 27.3* 28.2* 28.1*  MCV 88.3 87.1 86.4 85.7 86.5  PLT 500* 485* 407* 479* 470*   Cardiac Enzymes: No results for input(s): CKTOTAL, CKMB, CKMBINDEX, TROPONINI in the last 168 hours. BNP: Invalid input(s): POCBNP CBG: No results for input(s): GLUCAP in the last 168 hours. HbA1C: No results for input(s): HGBA1C in the last 72 hours. Urine analysis:    Component Value Date/Time   COLORURINE STRAW (A) 03/11/2017 0222   APPEARANCEUR CLEAR 03/11/2017 0222   LABSPEC 1.012 03/11/2017 0222   PHURINE 5.0 03/11/2017 0222   GLUCOSEU NEGATIVE 03/11/2017 0222   HGBUR NEGATIVE 03/11/2017 0222   BILIRUBINUR NEGATIVE 03/11/2017 0222   KETONESUR  NEGATIVE 03/11/2017 0222   PROTEINUR NEGATIVE 03/11/2017 0222   UROBILINOGEN 0.2 05/28/2013 0206   NITRITE NEGATIVE 03/11/2017 0222   LEUKOCYTESUR NEGATIVE 03/11/2017 0222   Sepsis Labs: @LABRCNTIP (procalcitonin:4,lacticidven:4) ) Recent Results (from the past 240 hour(s))  Blood Culture (routine x 2)     Status: None   Collection Time: 03/06/17  9:28 PM  Result Value Ref Range Status   Specimen Description LEFT ANTECUBITAL  Final   Special Requests   Final    BOTTLES DRAWN AEROBIC AND ANAEROBIC Blood Culture adequate volume   Culture   Final    NO GROWTH 5 DAYS Performed at [facility], [address], [place], [place] [address]    Report Status 03/11/2017 FINAL  Final  Blood Culture (routine x 2)     Status: None   Collection Time: 03/06/17  9:35 PM  Result Value Ref Range Status   Specimen Description RIGHT ANTECUBITAL  Final   Special Requests   Final    BOTTLES DRAWN AEROBIC AND ANAEROBIC Blood Culture adequate volume   Culture   Final    NO GROWTH 5 DAYS Performed at [facility], [address], [place], [place] [address]    Report Status 03/11/2017 FINAL  Final  Respiratory Panel by PCR     Status: Abnormal   Collection Time: 03/06/17 10:15 PM  Result Value Ref Range Status   Adenovirus NOT DETECTED NOT DETECTED Final   Coronavirus 229E NOT DETECTED NOT DETECTED Final   Coronavirus HKU1 NOT DETECTED NOT DETECTED Final   Coronavirus NL63 NOT DETECTED NOT DETECTED Final   Coronavirus OC43 DETECTED (A) NOT DETECTED Final   Metapneumovirus NOT DETECTED NOT DETECTED Final   Rhinovirus / Enterovirus NOT DETECTED NOT DETECTED Final   Influenza A NOT DETECTED NOT DETECTED Final   Influenza B NOT DETECTED NOT DETECTED Final   Parainfluenza Virus 1 NOT DETECTED NOT DETECTED Final   Parainfluenza Virus 2 NOT DETECTED NOT DETECTED Final   Parainfluenza Virus 3 NOT DETECTED NOT DETECTED Final   Parainfluenza Virus 4 NOT DETECTED NOT DETECTED Final   Respiratory Syncytial  Virus NOT DETECTED NOT DETECTED Final   Bordetella pertussis NOT DETECTED NOT DETECTED Final   Chlamydophila pneumoniae NOT DETECTED NOT DETECTED Final   Mycoplasma pneumoniae NOT DETECTED NOT DETECTED Final    Comment: Performed at [facility], [place] [address] [place], [place] [address]  Gastrointestinal Panel by PCR , Stool     Status: Abnormal   Collection Time: 03/11/17 12:14 AM  Result Value Ref Range Status   Campylobacter species NOT DETECTED NOT DETECTED Final   Plesimonas shigelloides NOT DETECTED NOT  DETECTED Final   Salmonella species NOT DETECTED NOT DETECTED Final   Yersinia enterocolitica NOT DETECTED NOT DETECTED Final   Vibrio species NOT DETECTED NOT DETECTED Final   Vibrio cholerae NOT DETECTED NOT DETECTED Final   Enteroaggregative E coli (EAEC) NOT DETECTED NOT DETECTED Final   Enteropathogenic E coli (EPEC) NOT DETECTED NOT DETECTED Final   Enterotoxigenic E coli (ETEC) NOT DETECTED NOT DETECTED Final   Shiga like toxin producing E coli (STEC) NOT DETECTED NOT DETECTED Final   Shigella/Enteroinvasive E coli (EIEC) NOT DETECTED NOT DETECTED Final   Cryptosporidium NOT DETECTED NOT DETECTED Final   Cyclospora cayetanensis NOT DETECTED NOT DETECTED Final   Entamoeba histolytica NOT DETECTED NOT DETECTED Final   Giardia lamblia NOT DETECTED NOT DETECTED Final   Adenovirus F40/41 NOT DETECTED NOT DETECTED Final   Astrovirus NOT DETECTED NOT DETECTED Final   Norovirus GI/GII DETECTED (A) NOT DETECTED Final    Comment: RESULT CALLED TO, READ BACK BY AND VERIFIED WITH: JUSTIN MIZE AT 1936 03/11/17.PMH    Rotavirus A NOT DETECTED NOT DETECTED Final   Sapovirus (I, II, IV, and V) NOT DETECTED NOT DETECTED Final    Comment: Performed at [facility], [address] [place], [place] [address]  C difficile quick scan w PCR reflex     Status: None   Collection Time: 03/11/17 12:14 AM  Result Value Ref Range Status   C Diff antigen NEGATIVE NEGATIVE Final   C  Diff toxin NEGATIVE NEGATIVE Final   C Diff interpretation No C. difficile detected.  Final    Comment: VALID Performed at [facility], [address] [place], [place] [address]   Blood Culture (routine x 2)     Status: None (Preliminary result)   Collection Time: 03/11/17 12:41 AM  Result Value Ref Range Status   Specimen Description BLOOD RIGHT ARM  Final   Special Requests   Final    BOTTLES DRAWN AEROBIC AND ANAEROBIC Blood Culture adequate volume   Culture   Final    NO GROWTH 1 DAY Performed at [facility], [address] [place], [place] [address]    Report Status PENDING  Incomplete  Blood Culture (routine x 2)     Status: None (Preliminary result)   Collection Time: 03/11/17  1:00 AM  Result Value Ref Range Status   Specimen Description BLOOD RIGHT HAND  Final   Special Requests   Final    BOTTLES DRAWN AEROBIC AND ANAEROBIC Blood Culture adequate volume   Culture   Final    NO GROWTH 1 DAY Performed at [facility], [address] [place],  [address]    Report Status PENDING  Incomplete     Scheduled Meds: . aspirin EC  81 mg Oral Daily  . calcium-vitamin D  2 tablet Oral BID  . cycloSPORINE modified  300 mg Oral Q12H  . DULoxetine  60 mg Oral Daily  . magnesium oxide  1,200 mg Oral BID  . mycophenolate  1,000 mg Oral Q12H  . nortriptyline  50 mg Oral QHS  . pantoprazole  40 mg Oral BID AC  . predniSONE  7.5 mg Oral Q breakfast  . QUEtiapine  50 mg Oral Q8H  . sodium bicarbonate  1,300 mg Oral BID  . [START ON 03/13/2017] sulfamethoxazole-trimethoprim  1 tablet Oral Q M,W,F   Continuous Infusions: . sodium chloride 75 mL/hr at 03/11/17 2145  . piperacillin-tazobactam (ZOSYN)  IV 3.375 g (03/12/17 0540)  . vancomycin Stopped (03/11/17  2321)    Procedures/Studies: Ct Abdomen Pelvis Wo Contrast  Result Date: 03/11/2017 CLINICAL DATA:  Diarrhea and fever since discharge from [facility] on Wednesday. Symptoms worse today. Multiple organ  transplants in July. EXAM: CT ABDOMEN AND PELVIS WITHOUT CONTRAST TECHNIQUE: Multidetector CT imaging of the abdomen and pelvis was performed following the standard protocol without IV contrast. COMPARISON:  07/02/2016 FINDINGS: Lower chest: Lung bases are clear. Mild cardiac enlargement with minimal pericardial effusion. Hepatobiliary: Liver configuration and parenchymal pattern are homogeneous and normal. Presumably this represents an interval liver transplants since previous study. No focal lesions identified. Gallbladder is absent. Pancreas: Limited visualization of the pancreas. Visualized portion appears intact. Spleen: Spleen is surgically absent. Adrenals/Urinary Tract: No adrenal gland nodules. Multiple calcifications throughout the right kidney, largest in the upper pole measuring 9 mm in diameter. No hydronephrosis or hydroureter. No ureteral stones identified. Left kidney in ureter and the bladder are unremarkable. Stomach/Bowel: Stomach is decompressed. Small bowel are mostly decompressed. Colon is diffusely stool-filled. No definite evidence of wall thickening although abdominal fluid limits evaluation of the bowel wall on unenhanced scan. Prominent mesenteric vascularity is nonspecific and could represent varices. Visualization is limited due to mesenteric fluid. Scattered diverticula in the sigmoid colon. Vascular/Lymphatic: No aortic aneurysm. Scattered aortic calcifications. No significant lymphadenopathy. Reproductive: Status post hysterectomy. No adnexal masses. Other: There is diffuse free fluid throughout the abdomen and pelvis and throughout the mesentery. Hounsfield unit measurements are consistent with non hemorrhagic fluid. No free air in the abdomen. Postoperative changes over the anterior abdominal wall. Surgical clips in the abdomen. Musculoskeletal: No acute or significant osseous findings. Unenhanced CT was performed per order. Lack of IV contrast limits sensitivity and specificity,  especially for evaluation of abdominal/pelvic solid viscera. IMPRESSION: 1. There appears to been a liver transplant since the previous study. Resection of the spleen. Possible pancreatic surgery. Gallbladder is absent. 2. Large amount of free fluid throughout the abdomen and pelvis and in the mesentery. No free air. This is likely due to ascites. 3. No definite evidence of bowel obstruction or inflammation although evaluation of the bowel wall is limited due to the large amount of fluid. 4. Nonobstructing stones in the right kidney similar to prior study. Electronically Signed   By: Lucienne Capers M.D.   On: 03/11/2017 02:22   Dg Chest 2 View  Result Date: 03/11/2017 CLINICAL DATA:  Sepsis. EXAM: CHEST  2 VIEW COMPARISON:  Radiograph of March 06, 2017. FINDINGS: Stable cardiomediastinal silhouette. No pneumothorax or pleural effusion is noted. Both lungs are clear. The visualized skeletal structures are unremarkable. IMPRESSION: No active cardiopulmonary disease. Electronically Signed   By: Marijo Conception, M.D.   On: 03/11/2017 01:55   Dg Chest Port 1 View  Result Date: 03/06/2017 CLINICAL DATA:  57 y/o F; cough, body aches, congestion, chills, starting today. EXAM: PORTABLE CHEST 1 VIEW COMPARISON:  06/23/2016 chest radiograph FINDINGS: Stable heart size and mediastinal contours are within normal limits. Both lungs are clear. The visualized skeletal structures are unremarkable. IMPRESSION: No active disease. Electronically Signed   By: Kristine Garbe M.D.   On: 03/06/2017 22:03    Nita Sells, DO  [facility] Pager [phone]  If 7PM-7AM, please contact night-coverage [email or web address] Password [email or web address] 03/12/2017, 8:29 AM   LOS: 1 day

## 2017-03-13 DIAGNOSIS — Z7682 Awaiting organ transplant status: Secondary | ICD-10-CM | POA: Diagnosis not present

## 2017-03-13 DIAGNOSIS — R4182 Altered mental status, unspecified: Secondary | ICD-10-CM | POA: Diagnosis not present

## 2017-03-13 DIAGNOSIS — F418 Other specified anxiety disorders: Secondary | ICD-10-CM | POA: Diagnosis not present

## 2017-03-13 DIAGNOSIS — R197 Diarrhea, unspecified: Secondary | ICD-10-CM | POA: Diagnosis not present

## 2017-03-13 DIAGNOSIS — Z9482 Intestine transplant status: Secondary | ICD-10-CM | POA: Diagnosis not present

## 2017-03-13 DIAGNOSIS — R404 Transient alteration of awareness: Secondary | ICD-10-CM | POA: Diagnosis not present

## 2017-03-13 DIAGNOSIS — J069 Acute upper respiratory infection, unspecified: Secondary | ICD-10-CM | POA: Diagnosis not present

## 2017-03-13 DIAGNOSIS — Z87442 Personal history of urinary calculi: Secondary | ICD-10-CM | POA: Diagnosis not present

## 2017-03-13 DIAGNOSIS — Z9884 Bariatric surgery status: Secondary | ICD-10-CM | POA: Diagnosis not present

## 2017-03-13 DIAGNOSIS — J019 Acute sinusitis, unspecified: Secondary | ICD-10-CM | POA: Diagnosis not present

## 2017-03-13 DIAGNOSIS — Z881 Allergy status to other antibiotic agents status: Secondary | ICD-10-CM | POA: Diagnosis not present

## 2017-03-13 DIAGNOSIS — B9729 Other coronavirus as the cause of diseases classified elsewhere: Secondary | ICD-10-CM | POA: Diagnosis not present

## 2017-03-13 DIAGNOSIS — E119 Type 2 diabetes mellitus without complications: Secondary | ICD-10-CM | POA: Diagnosis not present

## 2017-03-13 DIAGNOSIS — K6389 Other specified diseases of intestine: Secondary | ICD-10-CM | POA: Diagnosis not present

## 2017-03-13 DIAGNOSIS — Z8619 Personal history of other infectious and parasitic diseases: Secondary | ICD-10-CM | POA: Diagnosis not present

## 2017-03-13 DIAGNOSIS — Z9109 Other allergy status, other than to drugs and biological substances: Secondary | ICD-10-CM | POA: Diagnosis not present

## 2017-03-13 DIAGNOSIS — K648 Other hemorrhoids: Secondary | ICD-10-CM | POA: Diagnosis not present

## 2017-03-13 DIAGNOSIS — R51 Headache: Secondary | ICD-10-CM | POA: Diagnosis not present

## 2017-03-13 DIAGNOSIS — D899 Disorder involving the immune mechanism, unspecified: Secondary | ICD-10-CM | POA: Diagnosis not present

## 2017-03-13 DIAGNOSIS — Z9483 Pancreas transplant status: Secondary | ICD-10-CM | POA: Diagnosis not present

## 2017-03-13 DIAGNOSIS — Z944 Liver transplant status: Secondary | ICD-10-CM | POA: Diagnosis not present

## 2017-03-13 DIAGNOSIS — K5730 Diverticulosis of large intestine without perforation or abscess without bleeding: Secondary | ICD-10-CM | POA: Diagnosis not present

## 2017-03-13 DIAGNOSIS — A0811 Acute gastroenteropathy due to Norwalk agent: Secondary | ICD-10-CM | POA: Diagnosis not present

## 2017-03-13 DIAGNOSIS — Z09 Encounter for follow-up examination after completed treatment for conditions other than malignant neoplasm: Secondary | ICD-10-CM | POA: Diagnosis not present

## 2017-03-13 DIAGNOSIS — Z91041 Radiographic dye allergy status: Secondary | ICD-10-CM | POA: Diagnosis not present

## 2017-03-13 MED ORDER — QUETIAPINE FUMARATE 25 MG PO TABS
25.00 | ORAL_TABLET | ORAL | Status: DC
Start: 2017-03-14 — End: 2017-03-13

## 2017-03-13 MED ORDER — CALCIUM CITRATE-VITAMIN D 315-250 MG-UNIT PO TABS
2.00 | ORAL_TABLET | ORAL | Status: DC
Start: 2017-03-13 — End: 2017-03-13

## 2017-03-13 MED ORDER — VITAMIN D (ERGOCALCIFEROL) 1.25 MG (50000 UNIT) PO CAPS
50000.00 | ORAL_CAPSULE | ORAL | Status: DC
Start: 2017-03-20 — End: 2017-03-13

## 2017-03-13 MED ORDER — CYCLOSPORINE MODIFIED (NEORAL) 100 MG PO CAPS
200.00 | ORAL_CAPSULE | ORAL | Status: DC
Start: 2017-03-13 — End: 2017-03-13

## 2017-03-13 MED ORDER — MAGNESIUM OXIDE 400 MG PO TABS
1200.00 | ORAL_TABLET | ORAL | Status: DC
Start: 2017-03-13 — End: 2017-03-13

## 2017-03-13 MED ORDER — SODIUM BICARBONATE 650 MG PO TABS
1300.00 | ORAL_TABLET | ORAL | Status: DC
Start: 2017-03-13 — End: 2017-03-13

## 2017-03-13 MED ORDER — PANTOPRAZOLE SODIUM 40 MG PO TBEC
40.00 | DELAYED_RELEASE_TABLET | ORAL | Status: DC
Start: 2017-03-13 — End: 2017-03-13

## 2017-03-13 MED ORDER — SULFAMETHOXAZOLE-TRIMETHOPRIM 800-160 MG PO TABS
1.00 | ORAL_TABLET | ORAL | Status: DC
Start: 2017-03-15 — End: 2017-03-13

## 2017-03-13 MED ORDER — PREGABALIN 50 MG PO CAPS
50.00 | ORAL_CAPSULE | ORAL | Status: DC
Start: 2017-03-13 — End: 2017-03-13

## 2017-03-13 MED ORDER — DULOXETINE HCL 30 MG PO CPEP
60.00 | ORAL_CAPSULE | ORAL | Status: DC
Start: 2017-03-14 — End: 2017-03-13

## 2017-03-13 MED ORDER — ASPIRIN EC 81 MG PO TBEC
81.00 | DELAYED_RELEASE_TABLET | ORAL | Status: DC
Start: 2017-03-14 — End: 2017-03-13

## 2017-03-13 MED ORDER — PREGABALIN 25 MG PO CAPS
25.00 | ORAL_CAPSULE | ORAL | Status: DC
Start: 2017-03-14 — End: 2017-03-13

## 2017-03-13 MED ORDER — NORTRIPTYLINE HCL 25 MG PO CAPS
50.00 | ORAL_CAPSULE | ORAL | Status: DC
Start: ? — End: 2017-03-13

## 2017-03-13 MED ORDER — LIDOCAINE HCL 1 % IJ SOLN
0.50 | INTRAMUSCULAR | Status: DC
Start: ? — End: 2017-03-13

## 2017-03-13 MED ORDER — PREDNISONE 5 MG PO TABS
7.50 | ORAL_TABLET | ORAL | Status: DC
Start: 2017-03-14 — End: 2017-03-13

## 2017-03-13 MED ORDER — PROCHLORPERAZINE MALEATE 5 MG PO TABS
10.00 | ORAL_TABLET | ORAL | Status: DC
Start: ? — End: 2017-03-13

## 2017-03-13 MED ORDER — MYCOPHENOLATE MOFETIL 250 MG PO CAPS
750.00 | ORAL_CAPSULE | ORAL | Status: DC
Start: 2017-03-13 — End: 2017-03-13

## 2017-03-14 DIAGNOSIS — D899 Disorder involving the immune mechanism, unspecified: Secondary | ICD-10-CM | POA: Diagnosis not present

## 2017-03-14 DIAGNOSIS — J069 Acute upper respiratory infection, unspecified: Secondary | ICD-10-CM | POA: Diagnosis not present

## 2017-03-14 DIAGNOSIS — F418 Other specified anxiety disorders: Secondary | ICD-10-CM | POA: Diagnosis not present

## 2017-03-14 DIAGNOSIS — R51 Headache: Secondary | ICD-10-CM | POA: Diagnosis not present

## 2017-03-14 DIAGNOSIS — R404 Transient alteration of awareness: Secondary | ICD-10-CM | POA: Diagnosis not present

## 2017-03-14 DIAGNOSIS — A0811 Acute gastroenteropathy due to Norwalk agent: Secondary | ICD-10-CM | POA: Diagnosis not present

## 2017-03-14 LAB — CYCLOSPORINE: Cyclosporine, LabCorp: 419 ng/mL — ABNORMAL HIGH (ref 100–400)

## 2017-03-15 DIAGNOSIS — Z7682 Awaiting organ transplant status: Secondary | ICD-10-CM | POA: Diagnosis not present

## 2017-03-15 DIAGNOSIS — R197 Diarrhea, unspecified: Secondary | ICD-10-CM | POA: Diagnosis not present

## 2017-03-15 DIAGNOSIS — F418 Other specified anxiety disorders: Secondary | ICD-10-CM | POA: Diagnosis not present

## 2017-03-15 DIAGNOSIS — J069 Acute upper respiratory infection, unspecified: Secondary | ICD-10-CM | POA: Diagnosis not present

## 2017-03-15 DIAGNOSIS — R4182 Altered mental status, unspecified: Secondary | ICD-10-CM | POA: Diagnosis not present

## 2017-03-15 DIAGNOSIS — A0811 Acute gastroenteropathy due to Norwalk agent: Secondary | ICD-10-CM | POA: Diagnosis not present

## 2017-03-15 DIAGNOSIS — R51 Headache: Secondary | ICD-10-CM | POA: Diagnosis not present

## 2017-03-15 DIAGNOSIS — Z09 Encounter for follow-up examination after completed treatment for conditions other than malignant neoplasm: Secondary | ICD-10-CM | POA: Diagnosis not present

## 2017-03-15 DIAGNOSIS — K6389 Other specified diseases of intestine: Secondary | ICD-10-CM | POA: Diagnosis not present

## 2017-03-15 DIAGNOSIS — D899 Disorder involving the immune mechanism, unspecified: Secondary | ICD-10-CM | POA: Diagnosis not present

## 2017-03-15 DIAGNOSIS — R404 Transient alteration of awareness: Secondary | ICD-10-CM | POA: Diagnosis not present

## 2017-03-16 DIAGNOSIS — D899 Disorder involving the immune mechanism, unspecified: Secondary | ICD-10-CM | POA: Diagnosis not present

## 2017-03-16 DIAGNOSIS — R51 Headache: Secondary | ICD-10-CM | POA: Diagnosis not present

## 2017-03-16 DIAGNOSIS — J069 Acute upper respiratory infection, unspecified: Secondary | ICD-10-CM | POA: Diagnosis not present

## 2017-03-16 DIAGNOSIS — A0811 Acute gastroenteropathy due to Norwalk agent: Secondary | ICD-10-CM | POA: Diagnosis not present

## 2017-03-16 LAB — CULTURE, BLOOD (ROUTINE X 2)
CULTURE: NO GROWTH
Culture: NO GROWTH
SPECIAL REQUESTS: ADEQUATE
Special Requests: ADEQUATE

## 2017-03-16 LAB — CMV DNA, QUANTITATIVE, PCR
CMV DNA Quant: NEGATIVE IU/mL
Log10 CMV Qn DNA Pl: UNDETERMINED log10 IU/mL

## 2017-03-17 DIAGNOSIS — A0811 Acute gastroenteropathy due to Norwalk agent: Secondary | ICD-10-CM | POA: Diagnosis not present

## 2017-03-17 DIAGNOSIS — D899 Disorder involving the immune mechanism, unspecified: Secondary | ICD-10-CM | POA: Diagnosis not present

## 2017-03-17 DIAGNOSIS — J069 Acute upper respiratory infection, unspecified: Secondary | ICD-10-CM | POA: Diagnosis not present

## 2017-03-17 DIAGNOSIS — R51 Headache: Secondary | ICD-10-CM | POA: Diagnosis not present

## 2017-03-17 NOTE — Discharge Summary (Signed)
Physician Discharge Summary  [name] GXQ:[id number] DOB: 10-30-1960 DOA: 03/11/2017  PCP: Redmond School, MD  Admit date: 03/11/2017 Discharge date: 03/17/2017  Time spent: 40 minutes  Recommendations for Outpatient Follow-up:  1. 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[place])   Diarrhea   Dehydration   Discharge Condition: good  Diet recommendation: reg  Filed Weights   03/11/17 0002 03/11/17 1612 03/12/17 0500  Weight: 79.4 kg (175 lb) 87.3 kg (192 lb 6.4 oz) 82.7 kg (182 lb 5.1 oz)    History of present illness:  57 y/o with multi-visceral transplant at [facility]7/2018 admitted to [facility] for the second time in 2 weeks-on immunosuppressants--had coronavirus and was d/c 1/30 afte rbeing admit 1/28 Re-admit 6 watery stool 2/1 trasnferred to [facility] as no [facility] beds Improved empircally--cultures reveavled Norovirus Note Cellcept levels were high at [facility] contacted, had a bed and patient trasnferred    Discharge Exam: Vitals:   03/12/17 0546 03/12/17 2121  BP: 137/75 (!) 174/85  Pulse: 83 69  Resp: 16 16  Temp: 97.7 F (36.5 C) 97.7 F (36.5 C)  SpO2: 98% 99%    General: awake alert pleasant in nad Cardiovascular:  s1 s 2no m/r/g Respiratory: ctab abd scar no rebdoun no guard Neuro awake alert interactive and intact  Discharge Instructions   Discharge Instructions    Diet - low sodium heart healthy   Complete by:  As directed    Increase activity slowly   Complete by:  As directed      Allergies as of 03/12/2017      Reactions   Ancef [cefazolin Sodium] Other (See Comments)   Reaction:  Vaginal and mouth blisters   Tapentadol    Gadobenate Itching, Nausea And Vomiting, Rash   Iodinated Diagnostic Agents Itching, Nausea And Vomiting, Rash   Tape Rash      Medication List     STOP taking these medications   aspirin EC 81 MG tablet   calcium-vitamin D 500-200 MG-UNIT tablet Commonly known as:  OSCAL WITH D     TAKE these medications   DULoxetine 60 MG capsule Commonly known as:  CYMBALTA Take 60 mg by mouth daily.   GENGRAF 100 MG capsule Generic drug:  cycloSPORINE modified Take 200-300 mg by mouth See admin instructions. Gengraf  300mg  every morning and 200mg  every night   loperamide 2 MG capsule Commonly known as:  IMODIUM Take 1 capsule (2 mg total) by mouth every 12 (twelve) hours as needed for diarrhea or loose stools.   magnesium oxide 400 MG tablet Commonly known as:  MAG-OX Take 1,200 mg by mouth 2 (two) times daily.   mycophenolate 250 MG capsule Commonly known as:  CELLCEPT Take 1,000 mg by mouth every 12 (twelve) hours.   nortriptyline 50 MG capsule Commonly known as:  PAMELOR Take 50 mg by mouth at bedtime.   ondansetron 4 MG tablet Commonly known as:  ZOFRAN Take 1 tablet (4 mg total) by mouth every 6 (six) hours as needed for nausea.   pantoprazole 40 MG tablet Commonly known as:  PROTONIX Take 1 tablet (40 mg total) by mouth 2 (two) times daily before a meal.   predniSONE 5 MG tablet Commonly known as:  DELTASONE Take 7.5 mg by mouth daily with breakfast.  prochlorperazine 10 MG tablet Commonly known as:  COMPAZINE Take 10 mg by mouth every 8 (eight) hours as needed for nausea or vomiting.   QUEtiapine 50 MG tablet Commonly known as:  SEROQUEL Take 50 mg by mouth every 8 (eight) hours.   sodium bicarbonate 650 MG tablet Take 1,300 mg by mouth 2 (two) times daily.   sulfamethoxazole-trimethoprim 800-160 MG tablet Commonly known as:  BACTRIM DS,SEPTRA DS Take 1 tablet by mouth every Monday, Wednesday, and Friday.      Allergies  Allergen Reactions  . Ancef [Cefazolin Sodium] Other (See Comments)    Reaction:  Vaginal and mouth blisters  . Tapentadol   . Gadobenate Itching, Nausea And Vomiting and Rash   . Iodinated Diagnostic Agents Itching, Nausea And Vomiting and Rash  . Tape Rash      The results of significant diagnostics from this hospitalization (including imaging, microbiology, ancillary and laboratory) are listed below for reference.    Significant Diagnostic Studies: Ct Abdomen Pelvis Wo Contrast  Result Date: 03/11/2017 CLINICAL DATA:  Diarrhea and fever since discharge from [facility] on Wednesday. Symptoms worse today. Multiple organ transplants in July. EXAM: CT ABDOMEN AND PELVIS WITHOUT CONTRAST TECHNIQUE: Multidetector CT imaging of the abdomen and pelvis was performed following the standard protocol without IV contrast. COMPARISON:  07/02/2016 FINDINGS: Lower chest: Lung bases are clear. Mild cardiac enlargement with minimal pericardial effusion. Hepatobiliary: Liver configuration and parenchymal pattern are homogeneous and normal. Presumably this represents an interval liver transplants since previous study. No focal lesions identified. Gallbladder is absent. Pancreas: Limited visualization of the pancreas. Visualized portion appears intact. Spleen: Spleen is surgically absent. Adrenals/Urinary Tract: No adrenal gland nodules. Multiple calcifications throughout the right kidney, largest in the upper pole measuring 9 mm in diameter. No hydronephrosis or hydroureter. No ureteral stones identified. Left kidney in ureter and the bladder are unremarkable. Stomach/Bowel: Stomach is decompressed. Small bowel are mostly decompressed. Colon is diffusely stool-filled. No definite evidence of wall thickening although abdominal fluid limits evaluation of the bowel wall on unenhanced scan. Prominent mesenteric vascularity is nonspecific and could represent varices. Visualization is limited due to mesenteric fluid. Scattered diverticula in the sigmoid colon. Vascular/Lymphatic: No aortic aneurysm. Scattered aortic calcifications. No significant lymphadenopathy. Reproductive: Status post  hysterectomy. No adnexal masses. Other: There is diffuse free fluid throughout the abdomen and pelvis and throughout the mesentery. Hounsfield unit measurements are consistent with non hemorrhagic fluid. No free air in the abdomen. Postoperative changes over the anterior abdominal wall. Surgical clips in the abdomen. Musculoskeletal: No acute or significant osseous findings. Unenhanced CT was performed per order. Lack of IV contrast limits sensitivity and specificity, especially for evaluation of abdominal/pelvic solid viscera. IMPRESSION: 1. There appears to been a liver transplant since the previous study. Resection of the spleen. Possible pancreatic surgery. Gallbladder is absent. 2. Large amount of free fluid throughout the abdomen and pelvis and in the mesentery. No free air. This is likely due to ascites. 3. No definite evidence of bowel obstruction or inflammation although evaluation of the bowel wall is limited due to the large amount of fluid. 4. Nonobstructing stones in the right kidney similar to prior study. Electronically Signed   By: Lucienne Capers M.D.   On: 03/11/2017 02:22   Dg Chest 2 View  Result Date: 03/11/2017 CLINICAL DATA:  Sepsis. EXAM: CHEST  2 VIEW COMPARISON:  Radiograph of March 06, 2017. FINDINGS: Stable cardiomediastinal silhouette. No pneumothorax or pleural effusion is noted. Both lungs are clear.  The visualized skeletal structures are unremarkable. IMPRESSION: No active cardiopulmonary disease. Electronically Signed   By: Marijo Conception, M.D.   On: 03/11/2017 01:55   Dg Chest Port 1 View  Result Date: 03/06/2017 CLINICAL DATA:  57 y/o F; cough, body aches, congestion, chills, starting today. EXAM: PORTABLE CHEST 1 VIEW COMPARISON:  06/23/2016 chest radiograph FINDINGS: Stable heart size and mediastinal contours are within normal limits. Both lungs are clear. The visualized skeletal structures are unremarkable. IMPRESSION: No active disease. Electronically Signed   By:  Kristine Garbe M.D.   On: 03/06/2017 22:03    Microbiology: Recent Results (from the past 240 hour(s))  Gastrointestinal Panel by PCR , Stool     Status: Abnormal   Collection Time: 03/11/17 12:14 AM  Result Value Ref Range Status   Campylobacter species NOT DETECTED NOT DETECTED Final   Plesimonas shigelloides NOT DETECTED NOT DETECTED Final   Salmonella species NOT DETECTED NOT DETECTED Final   Yersinia enterocolitica NOT DETECTED NOT DETECTED Final   Vibrio species NOT DETECTED NOT DETECTED Final   Vibrio cholerae NOT DETECTED NOT DETECTED Final   Enteroaggregative E coli (EAEC) NOT DETECTED NOT DETECTED Final   Enteropathogenic E coli (EPEC) NOT DETECTED NOT DETECTED Final   Enterotoxigenic E coli (ETEC) NOT DETECTED NOT DETECTED Final   Shiga like toxin producing E coli (STEC) NOT DETECTED NOT DETECTED Final   Shigella/Enteroinvasive E coli (EIEC) NOT DETECTED NOT DETECTED Final   Cryptosporidium NOT DETECTED NOT DETECTED Final   Cyclospora cayetanensis NOT DETECTED NOT DETECTED Final   Entamoeba histolytica NOT DETECTED NOT DETECTED Final   Giardia lamblia NOT DETECTED NOT DETECTED Final   Adenovirus F40/41 NOT DETECTED NOT DETECTED Final   Astrovirus NOT DETECTED NOT DETECTED Final   Norovirus GI/GII DETECTED (A) NOT DETECTED Final    Comment: RESULT CALLED TO, READ BACK BY AND VERIFIED WITH: JUSTIN MIZE AT 1936 03/11/17.PMH    Rotavirus A NOT DETECTED NOT DETECTED Final   Sapovirus (I, II, IV, and V) NOT DETECTED NOT DETECTED Final    Comment: Performed at [facility], [address] [place], [place] [address]  C difficile quick scan w PCR reflex     Status: None   Collection Time: 03/11/17 12:14 AM  Result Value Ref Range Status   C Diff antigen NEGATIVE NEGATIVE Final   C Diff toxin NEGATIVE NEGATIVE Final   C Diff interpretation No C. difficile detected.  Final    Comment: VALID Performed at [facility], [address] [place], [place]  [address]   Blood Culture (routine x 2)     Status: None   Collection Time: 03/11/17 12:41 AM  Result Value Ref Range Status   Specimen Description BLOOD RIGHT ARM  Final   Special Requests   Final    BOTTLES DRAWN AEROBIC AND ANAEROBIC Blood Culture adequate volume   Culture   Final    NO GROWTH 5 DAYS Performed at [facility], [address], [place], [place] [address]    Report Status 03/16/2017 FINAL  Final  Blood Culture (routine x 2)     Status: None   Collection Time: 03/11/17  1:00 AM  Result Value Ref Range Status   Specimen Description BLOOD RIGHT HAND  Final   Special Requests   Final    BOTTLES DRAWN AEROBIC ONLY Blood Culture adequate volume   Culture   Final    NO GROWTH 5 DAYS Performed at [facility], [address] [place],  [place] [address]    Report Status 03/16/2017 FINAL  Final  Urine culture     Status: None   Collection Time: 03/11/17  2:22 AM  Result Value Ref Range Status   Specimen Description   Final    URINE, CLEAN CATCH Performed at [facility], [address] [place], [place] [address]    Special Requests   Final    NONE Performed at [facility], [address], [place], [place] [address]    Culture   Final    NO GROWTH Performed at [facility], [address], [place], [place] [address]    Report Status 03/12/2017 FINAL  Final     Labs: Basic Metabolic Panel: Recent Labs  Lab 03/11/17 0041 03/11/17 0828 03/12/17 0840  NA 139 138 139  K 4.7 4.2 4.2  CL 116* 110 112*  CO2 15* 16* 15*  GLUCOSE 107* 89 87  BUN 42* 31* 20  CREATININE 1.41* 1.38* 1.49*  CALCIUM 8.2* 8.4* 8.7*  MG  --  1.4*  --   PHOS  --  4.2  --    Liver Function Tests: Recent Labs  Lab 03/11/17 0041 03/11/17 0828 03/12/17 0840  AST 42* 30 17  ALT 79* 70* 48  ALKPHOS 168* 158* 133*  BILITOT 0.6 0.6 0.8  PROT 6.2* 6.1* 5.3*  ALBUMIN 2.9* 2.9* 2.7*   No results for input(s): LIPASE, AMYLASE in the last 168 hours. No results for input(s): AMMONIA in  the last 168 hours. CBC: Recent Labs  Lab 03/11/17 0041 03/11/17 0828 03/12/17 0840  WBC 6.6 6.4 6.9  NEUTROABS 4.0 4.7 5.6  HGB 8.5* 8.3* 7.9*  HCT 28.2* 28.1* 25.9*  MCV 85.7 86.5 84.6  PLT 479* 470* 393   Cardiac Enzymes: No results for input(s): CKTOTAL, CKMB, CKMBINDEX, TROPONINI in the last 168 hours. BNP: BNP (last 3 results) No results for input(s): BNP in the last 8760 hours.  ProBNP (last 3 results) No results for input(s): PROBNP in the last 8760 hours.  CBG: No results for input(s): GLUCAP in the last 168 hours.     Signed:  Nita Sells MD   [facility] 03/17/2017, 5:25 PM

## 2017-03-18 DIAGNOSIS — F418 Other specified anxiety disorders: Secondary | ICD-10-CM | POA: Diagnosis not present

## 2017-03-18 DIAGNOSIS — J069 Acute upper respiratory infection, unspecified: Secondary | ICD-10-CM | POA: Diagnosis not present

## 2017-03-18 DIAGNOSIS — D899 Disorder involving the immune mechanism, unspecified: Secondary | ICD-10-CM | POA: Diagnosis not present

## 2017-03-18 DIAGNOSIS — R51 Headache: Secondary | ICD-10-CM | POA: Diagnosis not present

## 2017-03-18 DIAGNOSIS — A0811 Acute gastroenteropathy due to Norwalk agent: Secondary | ICD-10-CM | POA: Diagnosis not present

## 2017-03-18 DIAGNOSIS — R404 Transient alteration of awareness: Secondary | ICD-10-CM | POA: Diagnosis not present

## 2017-03-21 ENCOUNTER — Emergency Department (HOSPITAL_COMMUNITY)
Admission: EM | Admit: 2017-03-21 | Discharge: 2017-03-21 | Disposition: A | Discharge status: Home / Self Care | Payer: Medicare Other | Attending: Emergency Medicine | Admitting: Emergency Medicine

## 2017-03-21 ENCOUNTER — Emergency Department (HOSPITAL_COMMUNITY): Payer: Medicare Other

## 2017-03-21 ENCOUNTER — Encounter (HOSPITAL_COMMUNITY): Payer: Self-pay | Admitting: Emergency Medicine

## 2017-03-21 ENCOUNTER — Other Ambulatory Visit: Payer: Self-pay

## 2017-03-21 DIAGNOSIS — Z7982 Long term (current) use of aspirin: Secondary | ICD-10-CM | POA: Insufficient documentation

## 2017-03-21 DIAGNOSIS — W19XXXA Unspecified fall, initial encounter: Secondary | ICD-10-CM | POA: Diagnosis not present

## 2017-03-21 DIAGNOSIS — Y939 Activity, unspecified: Secondary | ICD-10-CM | POA: Diagnosis not present

## 2017-03-21 DIAGNOSIS — R55 Syncope and collapse: Secondary | ICD-10-CM | POA: Diagnosis not present

## 2017-03-21 DIAGNOSIS — S5002XA Contusion of left elbow, initial encounter: Secondary | ICD-10-CM | POA: Insufficient documentation

## 2017-03-21 DIAGNOSIS — Z79899 Other long term (current) drug therapy: Secondary | ICD-10-CM | POA: Diagnosis not present

## 2017-03-21 DIAGNOSIS — Y929 Unspecified place or not applicable: Secondary | ICD-10-CM | POA: Insufficient documentation

## 2017-03-21 DIAGNOSIS — R42 Dizziness and giddiness: Secondary | ICD-10-CM | POA: Diagnosis present

## 2017-03-21 DIAGNOSIS — E860 Dehydration: Secondary | ICD-10-CM

## 2017-03-21 DIAGNOSIS — Y999 Unspecified external cause status: Secondary | ICD-10-CM | POA: Insufficient documentation

## 2017-03-21 DIAGNOSIS — E119 Type 2 diabetes mellitus without complications: Secondary | ICD-10-CM | POA: Insufficient documentation

## 2017-03-21 DIAGNOSIS — Z87891 Personal history of nicotine dependence: Secondary | ICD-10-CM | POA: Diagnosis not present

## 2017-03-21 LAB — URINALYSIS, ROUTINE W REFLEX MICROSCOPIC
BILIRUBIN URINE: NEGATIVE
GLUCOSE, UA: NEGATIVE mg/dL
Hgb urine dipstick: NEGATIVE
KETONES UR: NEGATIVE mg/dL
Leukocytes, UA: NEGATIVE
Nitrite: NEGATIVE
PH: 5 (ref 5.0–8.0)
Protein, ur: NEGATIVE mg/dL
SPECIFIC GRAVITY, URINE: 1.014 (ref 1.005–1.030)

## 2017-03-21 LAB — COMPREHENSIVE METABOLIC PANEL
ALT: 96 U/L — AB (ref 14–54)
AST: 155 U/L — AB (ref 15–41)
Albumin: 3.5 g/dL (ref 3.5–5.0)
Alkaline Phosphatase: 159 U/L — ABNORMAL HIGH (ref 38–126)
Anion gap: 12 (ref 5–15)
BUN: 26 mg/dL — AB (ref 6–20)
CHLORIDE: 107 mmol/L (ref 101–111)
CO2: 20 mmol/L — ABNORMAL LOW (ref 22–32)
CREATININE: 1.23 mg/dL — AB (ref 0.44–1.00)
Calcium: 8.8 mg/dL — ABNORMAL LOW (ref 8.9–10.3)
GFR calc Af Amer: 56 mL/min — ABNORMAL LOW (ref >60)
GFR, EST NON AFRICAN AMERICAN: 48 mL/min — AB (ref >60)
GLUCOSE: 98 mg/dL (ref 65–99)
Potassium: 3.8 mmol/L (ref 3.5–5.1)
Sodium: 139 mmol/L (ref 135–145)
Total Bilirubin: 0.3 mg/dL (ref 0.3–1.2)
Total Protein: 7 g/dL (ref 6.5–8.1)

## 2017-03-21 LAB — CBC WITH DIFFERENTIAL/PLATELET
Basophils Absolute: 0.1 10*3/uL (ref 0.0–0.1)
Basophils Relative: 1 %
EOS ABS: 1.9 10*3/uL — AB (ref 0.0–0.7)
Eosinophils Relative: 15 %
HCT: 30.6 % — ABNORMAL LOW (ref 36.0–46.0)
Hemoglobin: 8.9 g/dL — ABNORMAL LOW (ref 12.0–15.0)
LYMPHS ABS: 0.4 10*3/uL — AB (ref 0.7–4.0)
Lymphocytes Relative: 3 %
MCH: 25.3 pg — AB (ref 26.0–34.0)
MCHC: 29.1 g/dL — AB (ref 30.0–36.0)
MCV: 86.9 fL (ref 78.0–100.0)
MONO ABS: 1.8 10*3/uL — AB (ref 0.1–1.0)
MONOS PCT: 14 %
Neutro Abs: 8.9 10*3/uL — ABNORMAL HIGH (ref 1.7–7.7)
Neutrophils Relative %: 67 %
PLATELETS: 706 10*3/uL — AB (ref 150–400)
RBC: 3.52 MIL/uL — ABNORMAL LOW (ref 3.87–5.11)
RDW: 16.3 % — AB (ref 11.5–15.5)
WBC: 13.1 10*3/uL — AB (ref 4.0–10.5)

## 2017-03-21 LAB — PROTIME-INR
INR: 0.95
PROTHROMBIN TIME: 12.6 s (ref 11.4–15.2)

## 2017-03-21 LAB — LACTIC ACID, PLASMA: Lactic Acid, Venous: 1.3 mmol/L (ref 0.5–1.9)

## 2017-03-21 MED ORDER — SODIUM CHLORIDE 0.9 % IV BOLUS (SEPSIS)
1000.0000 mL | Freq: Once | INTRAVENOUS | Status: AC
  Administered 2017-03-21: 1000 mL via INTRAVENOUS

## 2017-03-21 NOTE — ED Provider Notes (Signed)
[facility] EMERGENCY DEPARTMENT Provider Note   CSN: [id number] Arrival date & time: 03/21/17  1314     History   Chief Complaint Chief Complaint  Patient presents with  . Loss of Consciousness    HPI [name] is a 57 y.o. female. She is here because of concern for lightheadedness with standing, and 2 episodes of syncope, while walking, today and 2 days ago.  Both syncopal episodes occurred after walking, and were very brief.  Family members were close by both times and found her conscious on the floor.  Her only injury was hitting her left elbow today when she fell.  She has been eating well.  She denies fever, chills, nausea, vomiting, diarrhea, sneezing, coughing or chest pain.  She was discharged from [facility], 03/17/17, on Augmentin and Tamiflu, as treatment for sinusitis, and to prophylax against influenza.  Apparently family members at home have "colds."  Patient ate breakfast this morning but did not eat lunch.  She denies difficulty walking following the last syncopal episode.  She denies headache or back pain.  She is taking all of her medications as directed.  There are no other known modifying factors.  HPI  Past Medical History:  Diagnosis Date  . Anemia   . Anxiety   . Ascites   . 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 Complication of anesthesia   . Depression   . Diabetes mellitus type 2, controlled, without complications ([place])   . Enteropathy 07/31/2015   Portal hypertensive enteropathy per capsule study, with stigmata of bleeding.  . Esophageal varices ([place])   . Gastric ulcer 07/15/2015   per EGD; no stigmata of bleeding  . GI bleed 07/17/2015  . Gram-negative bacteremia 12/08/2011  . TKZSWFUX(323.5)    "monthly" (11/07/2012)  . Heart murmur   . History of hepatitis C - successfuly treated medically 07/14/2015  . Kidney stones   . Liver failure ([place])    "I'm on liver transplant list @ [facility]" (11/07/2012)  . PONV (postoperative nausea and vomiting)   .  Portal vein thrombosis   . Renal insufficiency   . Right ureteral stone 12/07/2011  . 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 Type II diabetes mellitus ([place])    "not since gastric bypass" (11/07/2012)    Patient Active Problem List   Diagnosis Date Noted  . Diarrhea 03/11/2017  . Nausea & vomiting 03/11/2017  . Sepsis ([place]) 03/11/2017  . Dehydration 03/11/2017  . Immunosuppressed status ([place])   . Influenza-like illness 03/08/2017  . Acute bronchitis, viral 03/08/2017  . Anemia 03/06/2017  . Headache(784.0) 03/06/2017  . Elevated blood pressure reading 03/06/2017  . Liver transplant recipient [facility]) 03/06/2017  . 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 Acute viral syndrome 03/06/2017  . Pancytopenia ([place]) 11/13/2015  . Cirrhosis of liver with ascites ([place])   . Abdominal pain 11/10/2015  . Alcoholic cirrhosis of liver with ascites ([place])   . Enteropathy 07/31/2015  . Anemia, blood loss   . Depression with anxiety 07/30/2015  . Esophageal varices in cirrhosis ([place]) 07/30/2015  . Gastritis determined by endoscopy 07/30/2015  . Hypotension 07/30/2015  . Hyponatremia 07/29/2015  . Hypokalemia 07/29/2015  . GI bleeding 07/29/2015  . Gastric ulcer 07/29/2015  . Leukopenia 07/29/2015  . AKI (acute kidney injury) ([place]) 07/29/2015  . Diabetes mellitus type 2, controlled, without complications ([place]) 57/32/2025  . GI bleed 07/17/2015  . Palliative care encounter   . Goals of care, counseling/discussion   .  DNR (do not resuscitate) discussion   . 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 Melena 07/14/2015  . Hepatic cirrhosis due to chronic hepatitis C infection ([place]) 07/14/2015  . History of hepatitis C - successfuly treated medically 07/14/2015  . Portal vein thrombosis 05/13/2014  . Other malaise and fatigue 08/05/2013  . Hypoxemia 08/05/2013  . Abdominal pain, right upper quadrant 05/29/2013  . SBP (spontaneous bacterial peritonitis) ([place]) 05/28/2013    . Distal radius fracture 10/25/2012  . Metatarsal bone fracture 10/25/2012  . Insomnia 09/25/2012  . Hepatic encephalopathy ([place]) 06/07/2012  . Hyperglycemia 06/06/2012  . LUQ abdominal pain 06/05/2012  . Ascites 05/14/2012  . Gram-negative bacteremia 12/08/2011  . Pyelonephritis 12/07/2011  . Hydronephrosis of right kidney 12/07/2011  . Right ureteral stone 12/07/2011  . Thrombocytopenia ([place]) 12/07/2011  . Cirrhosis of liver ([place]) 05/02/2011  . Acute blood loss anemia 05/02/2011    Past Surgical History:  Procedure Laterality Date  . ABDOMINAL HYSTERECTOMY  2003  . CESAREAN SECTION  1995  . CHOLECYSTECTOMY  1987  . COLONOSCOPY    . COLONOSCOPY  10/21/2011   Procedure: COLONOSCOPY;  Surgeon: Rogene Houston, MD;  Location[facility];  Service: Endoscopy;  Laterality: N/A;  245   . CYSTOSCOPY W/ RETROGRADES  12/08/2011   Procedure: CYSTOSCOPY WITH RETROGRADE PYELOGRAM;  Surgeon: Marissa Nestle, MD;  Location: [facility] ORS;  Service: Urology;  Laterality: Right;  . CYSTOSCOPY WITH STENT PLACEMENT  12/08/2011   Procedure: CYSTOSCOPY WITH STENT PLACEMENT;  Surgeon: Marissa Nestle, MD;  Location: [facility] ORS;  Service: Urology;  Laterality: Right;  . DILATION AND CURETTAGE OF UTERUS    . ESOPHAGOGASTRODUODENOSCOPY N/A 02/19/2014   Procedure: ESOPHAGOGASTRODUODENOSCOPY (EGD);  Surgeon: Rogene Houston, MD;  Location: [facility];  Service: Endoscopy;  Laterality: N/A;  100  . ESOPHAGOGASTRODUODENOSCOPY N/A 07/15/2015   Procedure: ESOPHAGOGASTRODUODENOSCOPY (EGD);  Surgeon: Rogene Houston, MD;  [facility];  Service: Endoscopy;  Laterality: N/A;  . ESOPHAGOGASTRODUODENOSCOPY N/A 07/29/2015   Procedure: ESOPHAGOGASTRODUODENOSCOPY (EGD);  Surgeon: Rogene Houston, MD;  [facility];  Service: Endoscopy;  Laterality: N/A;  . GASTRIC BYPASS  ~ 1995  . HERNIA REPAIR  04/14/11   "one in my bellybutton" (11/07/2012)  . INGUINAL HERNIA REPAIR Right    "maybe 2"  (11/07/2012)  . KNEE ARTHROSCOPY Right   . LITHOTRIPSY     "several times" (11/07/2012)  . OPEN REDUCTION INTERNAL FIXATION (ORIF) DISTAL RADIAL FRACTURE Right 11/07/2012   Procedure: OPEN REDUCTION INTERNAL FIXATION (ORIF) RIGHT DISTAL RADIAL FRACTURE;  Surgeon: [name], MD;  Location: [facility];  Service: Orthopedics;  Laterality: Right;  . ORIF DISTAL RADIUS FRACTURE Right 11/07/2012  . OTHER SURGICAL HISTORY  08/14/2016   5 organ transplant- liver, stomach, pancreas, small and large intestine  . UPPER GASTROINTESTINAL ENDOSCOPY      OB History    Gravida Para Term Preterm AB Living   1 1 1          SAB TAB Ectopic Multiple Live Births                   Home Medications    Prior to Admission medications   Medication Sig Start Date End Date Taking? Authorizing Provider  amLODipine (NORVASC) 10 MG tablet Take 1 tablet by mouth daily. 03/18/17  Yes [provider]  amoxicillin-clavulanate (AUGMENTIN) 875-125 MG tablet Take 1 tablet by mouth 2 (two) times daily. 03/18/17  Yes [provider]  aspirin EC 81 MG tablet Take 81 mg by mouth daily.   Yes [provider]  cycloSPORINE modified (GENGRAF) 100 MG capsule Take 150 mg by mouth See admin instructions. Gengraf  300mg  every morning and 200mg  every night   Yes [provider]  DULoxetine (CYMBALTA) 60 MG capsule Take 60 mg by mouth daily.   Yes [provider]  fexofenadine (ALLEGRA) 60 MG tablet Take 1 tablet by mouth daily. 03/19/17 03/19/18 Yes [provider]  loperamide (IMODIUM) 2 MG capsule Take 1 capsule (2 mg total) by mouth every 12 (twelve) hours as needed for diarrhea or loose stools. 03/12/17  Yes Nita Sells, MD  magnesium oxide (MAG-OX) 400 MG tablet Take 1,200 mg by mouth 2 (two) times daily.   Yes [provider]  mycophenolate (CELLCEPT) 250 MG capsule Take 750 mg by mouth every 12 (twelve) hours.    Yes [provider]  nortriptyline (PAMELOR)  50 MG capsule Take 50 mg by mouth at bedtime.   Yes [provider]  ondansetron (ZOFRAN) 4 MG tablet Take 1 tablet (4 mg total) by mouth every 6 (six) hours as needed for nausea. 03/12/17  Yes Nita Sells, MD  oseltamivir (TAMIFLU) 75 MG capsule Take 1 capsule by mouth daily. 03/18/17  Yes [provider]  pantoprazole (PROTONIX) 40 MG tablet Take 1 tablet (40 mg total) by mouth 2 (two) times daily before a meal. 11/17/16  Yes Rehman, Mechele Dawley, MD  predniSONE (DELTASONE) 5 MG tablet Take 5 mg by mouth daily with breakfast.    Yes [provider]  pregabalin (LYRICA) 50 MG capsule Take 50 mg by mouth 3 (three) times daily.   Yes [provider]  prochlorperazine (COMPAZINE) 10 MG tablet Take 10 mg by mouth every 8 (eight) hours as needed for nausea or vomiting.   Yes [provider]  QUEtiapine (SEROQUEL) 50 MG tablet Take 50 mg by mouth every 8 (eight) hours.   Yes [provider]  sodium bicarbonate 650 MG tablet Take 1,300 mg by mouth 2 (two) times daily.   Yes [provider]  sulfamethoxazole-trimethoprim (BACTRIM DS,SEPTRA DS) 800-160 MG tablet Take 1 tablet by mouth every Monday, Wednesday, and Friday.   Yes [provider]    Family History Family History  Problem Relation Age of Onset  . Dementia Mother   . Heart disease Father   . Liver disease Father   . Hypertension Father   . Diabetes Father   . Dementia Father   . Hypertension Brother   . Other Son        Cervical dystonia    Social History Social History   Tobacco Use  . Smoking status: Former Smoker    Packs/day: 0.50    Years: 5.00    Pack years: 2.50    Types: Cigarettes    Last attempt to quit: 05/02/1991    Years since quitting: 25.9  . Smokeless tobacco: Never Used  Substance Use Topics  . Alcohol use: No    Alcohol/week: 0.0 oz  . Drug use: No     Allergies   Ancef [cefazolin sodium]; Tapentadol; Gadobenate; Iodinated  diagnostic agents; and Tape   Review of Systems Review of Systems  All other systems reviewed and are negative.    Physical Exam Updated Vital Signs BP 129/70 (BP Location: Right Arm)   Pulse 83   Temp (!) 97.5 F (36.4 C) (Oral)   Resp 18   Ht 5\' 6"  (1.676  m)   Wt 82.6 kg (182 lb)   SpO2 100%   BMI 29.38 kg/m   Physical Exam  Constitutional: She is oriented to person, place, and time. She appears well-developed and well-nourished. No distress.  HENT:  Head: Normocephalic and atraumatic.  Eyes: Conjunctivae and EOM are normal. Pupils are equal, round, and reactive to light.  Neck: Normal range of motion and phonation normal. Neck supple.  Cardiovascular: Normal rate and regular rhythm.  Pulmonary/Chest: Effort normal and breath sounds normal. She exhibits no tenderness.  Abdominal: Soft. She exhibits no distension. There is no tenderness. There is no guarding.  Musculoskeletal: Normal range of motion. She exhibits no deformity.  Small contusion and abrasion left lateral elbow.  Neurological: She is alert and oriented to person, place, and time. She exhibits normal muscle tone.  Skin: Skin is warm and dry.  Psychiatric: She has a normal mood and affect. Her behavior is normal. Judgment and thought content normal.  Nursing note and vitals reviewed.    ED Treatments / Results  Labs (all labs ordered are listed, but only abnormal results are displayed) Labs Reviewed  COMPREHENSIVE METABOLIC PANEL - Abnormal; Notable for the following components:      Result Value   CO2 20 (*)    BUN 26 (*)    Creatinine, Ser 1.23 (*)    Calcium 8.8 (*)    AST 155 (*)    ALT 96 (*)    Alkaline Phosphatase 159 (*)    GFR calc non Af Amer 48 (*)    GFR calc Af Amer 56 (*)    All other components within normal limits  CBC WITH DIFFERENTIAL/PLATELET - Abnormal; Notable for the following components:   WBC 13.1 (*)    RBC 3.52 (*)    Hemoglobin 8.9 (*)    HCT 30.6 (*)    MCH 25.3 (*)     MCHC 29.1 (*)    RDW 16.3 (*)    Platelets 706 (*)    Neutro Abs 8.9 (*)    Lymphs Abs 0.4 (*)    Monocytes Absolute 1.8 (*)    Eosinophils Absolute 1.9 (*)    All other components within normal limits  CULTURE, BLOOD (ROUTINE X 2)  CULTURE, BLOOD (ROUTINE X 2)  PROTIME-INR  URINALYSIS, ROUTINE W REFLEX MICROSCOPIC  LACTIC ACID, PLASMA    BUN  Date Value Ref Range Status  03/21/2017 26 (H) 6 - 20 mg/dL Final  03/12/2017 20 6 - 20 mg/dL Final  03/11/2017 31 (H) 6 - 20 mg/dL Final  03/11/2017 42 (H) 6 - 20 mg/dL Final   Creat  Date Value Ref Range Status  07/19/2016 1.24 (H) 0.50 - 1.05 mg/dL Final    Comment:      For patients > or = 56 years of age: The upper reference limit for Creatinine is approximately 13% higher for people identified as African-American.     05/24/2016 1.17 (H) 0.50 - 1.05 mg/dL Final    Comment:      For patients > or = 56 years of age: The upper reference limit for Creatinine is approximately 13% higher for people identified as African-American.     01/06/2016 0.98 0.50 - 1.05 mg/dL Final    Comment:      For patients > or = 56 years of age: The upper reference limit for Creatinine is approximately 13% higher for people identified as African-American.     12/22/2015 0.90 0.50 - 1.05 mg/dL Final  Comment:      For patients > or = 56 years of age: The upper reference limit for Creatinine is approximately 13% higher for people identified as African-American.      Creatinine, Ser  Date Value Ref Range Status  03/21/2017 1.23 (H) 0.44 - 1.00 mg/dL Final  03/12/2017 1.49 (H) 0.44 - 1.00 mg/dL Final  03/11/2017 1.38 (H) 0.44 - 1.00 mg/dL Final  03/11/2017 1.41 (H) 0.44 - 1.00 mg/dL Final     EKG  EKG Interpretation  Date/Time:  Tuesday March 21 2017 13:29:55 EST Ventricular Rate:  104 PR Interval:  134 QRS Duration: 80 QT Interval:  354 QTC Calculation: 465 R Axis:   68 Text Interpretation:  Sinus tachycardia  Nonspecific T wave abnormality Abnormal ECG since last tracing no significant change Confirmed by Daleen Bo [phone]) on 03/21/2017 2:12:47 PM       Radiology Dg Chest 2 View  Result Date: 03/21/2017 CLINICAL DATA:  Recurrent syncopal episodes since 03/19/2017. EXAM: CHEST  2 VIEW COMPARISON:  PA and lateral chest 03/11/2017 and 05/22/2016. FINDINGS: The lungs are clear. Heart size is normal. There is no pneumothorax or pleural fluid. No bony abnormality. IMPRESSION: Normal chest. Electronically Signed   By: Inge Rise M.D.   On: 03/21/2017 13:50    Procedures Procedures (including critical care time)  Medications Ordered in ED Medications  sodium chloride 0.9 % bolus 1,000 mL (0 mLs Intravenous Stopped 03/21/17 1811)     Initial Impression / Assessment and Plan / ED Course  I have reviewed the triage vital signs and the nursing notes.  Pertinent labs & imaging results that were available during my care of the patient were reviewed by me and considered in my medical decision making (see chart for details).  Clinical Course as of Mar 22 1851  Tue Mar 21, 2017  1545 Status are positive for hypo-tension with elevating heart rate, when standing.  IV fluid bolus ordered.  [EW]    Clinical Course User Index [EW] Daleen Bo, MD     Patient Vitals for the past 24 hrs:  BP Temp Temp src Pulse Resp SpO2 Height Weight  03/21/17 1617 129/70 - - 83 - - - -  03/21/17 1600 129/70 - - - - - - -  03/21/17 1530 129/82 - - - - - - -  03/21/17 1324 - - - - - - 5\' 6"  (1.676 m) 82.6 kg (182 lb)  03/21/17 1323 118/68 (!) 97.5 F (36.4 C) Oral 97 18 100 % - -    6:50 PM Reevaluation with update and discussion. After initial assessment and treatment, an updated evaluation reveals following IV fluid and oral fluid, orthostatic blood pressure and pulses are marginally improved.  Findings discussed with the patient and all questions were answered.Daleen Bo     Final Clinical  Impressions(s) / ED Diagnoses   Final diagnoses:  Dehydration  Syncope, unspecified syncope type  Contusion of left elbow, initial encounter   Patient presenting with complaints of lightheadedness, with 2 episodes of syncope.  Recently discharged from the hospital after a admission for diarrhea, with concern for immunocompromise status, post transplant.  Patient's symptoms since that time have improved with decreased stooling and improved sinus symptoms.  Suspect mild dehydration, without hemodynamic instability.  Doubt serious bacterial infection or metabolic instability.   Nursing Notes Reviewed/ Care Coordinated Applicable Imaging Reviewed Interpretation of Laboratory Data incorporated into ED treatment  The patient appears reasonably screened and/or stabilized for discharge and I  doubt any other medical condition or other [facility] requiring further screening, evaluation, or treatment in the ED at this time prior to discharge.  Plan: Home Medications-continue current medications; Home Treatments-rest, fluids 2 L of water each day; return here if the recommended treatment, does not improve the symptoms; Recommended follow up-PCP, 1 week and as needed.    ED Discharge Orders    None       Daleen Bo, MD 03/21/17 [phone]

## 2017-03-21 NOTE — ED Triage Notes (Signed)
Patient states she was recently hospitalized at [facility] was placed on BP meds. Patient states she has been having episodes of syncope since Sunday. Last episode today at 12:30. Patient told by transplant coordinator to come to the ED. Was just released from [place] on Saturday.

## 2017-03-21 NOTE — Discharge Instructions (Signed)
Your periods of weakness and lightheadedness with fainting are likely related to dehydration.  Try to drink more fluids with a goal of 1-2 L of water each day.  Make sure that you are getting plenty of rest and continuing your usual medications.  The bruise and skin injury of your left elbow should improve within a few days.  It is safe to take Tylenol if needed, for pain.  Return here, if needed, for problems.

## 2017-03-21 NOTE — ED Notes (Signed)
Pt reports left rib pain but says she doesn't want the doctor to check it.  Will notify edp but pt says she wants to go home.   Pt says she will have the doctors at [facility] check her if her ribs still hurt.

## 2017-03-21 NOTE — ED Notes (Signed)
Notified Dr. Eulis Foster of [name]'s pain and told him she did not want to be evaluated further.  He instructed for [name] to take tylenol as needed and return if any problems.  [name] verbalized understanding.

## 2017-03-21 NOTE — ED Notes (Signed)
Pt given peanut butter and graham crackers  

## 2017-03-23 DIAGNOSIS — N189 Chronic kidney disease, unspecified: Secondary | ICD-10-CM | POA: Diagnosis not present

## 2017-03-23 DIAGNOSIS — R55 Syncope and collapse: Secondary | ICD-10-CM | POA: Diagnosis not present

## 2017-03-23 DIAGNOSIS — S2232XA Fracture of one rib, left side, initial encounter for closed fracture: Secondary | ICD-10-CM | POA: Diagnosis not present

## 2017-03-23 DIAGNOSIS — S22079A Unspecified fracture of T9-T10 vertebra, initial encounter for closed fracture: Secondary | ICD-10-CM | POA: Diagnosis not present

## 2017-03-23 DIAGNOSIS — R0789 Other chest pain: Secondary | ICD-10-CM | POA: Diagnosis not present

## 2017-03-23 DIAGNOSIS — Z949 Transplanted organ and tissue status, unspecified: Secondary | ICD-10-CM | POA: Diagnosis not present

## 2017-03-23 DIAGNOSIS — R569 Unspecified convulsions: Secondary | ICD-10-CM | POA: Diagnosis not present

## 2017-03-23 DIAGNOSIS — R945 Abnormal results of liver function studies: Secondary | ICD-10-CM | POA: Diagnosis not present

## 2017-03-23 DIAGNOSIS — F418 Other specified anxiety disorders: Secondary | ICD-10-CM | POA: Diagnosis not present

## 2017-03-23 DIAGNOSIS — C7931 Secondary malignant neoplasm of brain: Secondary | ICD-10-CM | POA: Diagnosis not present

## 2017-03-23 DIAGNOSIS — D899 Disorder involving the immune mechanism, unspecified: Secondary | ICD-10-CM | POA: Diagnosis not present

## 2017-03-23 DIAGNOSIS — C499 Malignant neoplasm of connective and soft tissue, unspecified: Secondary | ICD-10-CM | POA: Diagnosis not present

## 2017-03-23 DIAGNOSIS — Z9482 Intestine transplant status: Secondary | ICD-10-CM | POA: Diagnosis not present

## 2017-03-23 DIAGNOSIS — B182 Chronic viral hepatitis C: Secondary | ICD-10-CM | POA: Diagnosis not present

## 2017-03-23 DIAGNOSIS — N202 Calculus of kidney with calculus of ureter: Secondary | ICD-10-CM | POA: Diagnosis not present

## 2017-03-23 DIAGNOSIS — Z48815 Encounter for surgical aftercare following surgery on the digestive system: Secondary | ICD-10-CM | POA: Diagnosis not present

## 2017-03-23 DIAGNOSIS — R2689 Other abnormalities of gait and mobility: Secondary | ICD-10-CM | POA: Diagnosis not present

## 2017-03-23 DIAGNOSIS — I129 Hypertensive chronic kidney disease with stage 1 through stage 4 chronic kidney disease, or unspecified chronic kidney disease: Secondary | ICD-10-CM | POA: Diagnosis not present

## 2017-03-23 DIAGNOSIS — G252 Other specified forms of tremor: Secondary | ICD-10-CM | POA: Diagnosis not present

## 2017-03-23 DIAGNOSIS — W19XXXA Unspecified fall, initial encounter: Secondary | ICD-10-CM | POA: Diagnosis not present

## 2017-03-23 DIAGNOSIS — R51 Headache: Secondary | ICD-10-CM | POA: Diagnosis not present

## 2017-03-23 DIAGNOSIS — E1122 Type 2 diabetes mellitus with diabetic chronic kidney disease: Secondary | ICD-10-CM | POA: Diagnosis not present

## 2017-03-23 DIAGNOSIS — D649 Anemia, unspecified: Secondary | ICD-10-CM | POA: Diagnosis not present

## 2017-03-23 DIAGNOSIS — Z944 Liver transplant status: Secondary | ICD-10-CM | POA: Diagnosis not present

## 2017-03-23 DIAGNOSIS — I951 Orthostatic hypotension: Secondary | ICD-10-CM | POA: Diagnosis not present

## 2017-03-23 DIAGNOSIS — K567 Ileus, unspecified: Secondary | ICD-10-CM | POA: Diagnosis not present

## 2017-03-23 DIAGNOSIS — G8929 Other chronic pain: Secondary | ICD-10-CM | POA: Diagnosis not present

## 2017-03-23 DIAGNOSIS — E119 Type 2 diabetes mellitus without complications: Secondary | ICD-10-CM | POA: Diagnosis not present

## 2017-03-24 DIAGNOSIS — D899 Disorder involving the immune mechanism, unspecified: Secondary | ICD-10-CM | POA: Diagnosis not present

## 2017-03-24 DIAGNOSIS — Z9482 Intestine transplant status: Secondary | ICD-10-CM | POA: Diagnosis not present

## 2017-03-24 DIAGNOSIS — I951 Orthostatic hypotension: Secondary | ICD-10-CM | POA: Diagnosis not present

## 2017-03-24 DIAGNOSIS — R55 Syncope and collapse: Secondary | ICD-10-CM | POA: Diagnosis not present

## 2017-03-24 DIAGNOSIS — R2689 Other abnormalities of gait and mobility: Secondary | ICD-10-CM | POA: Diagnosis not present

## 2017-03-24 DIAGNOSIS — Z944 Liver transplant status: Secondary | ICD-10-CM | POA: Diagnosis not present

## 2017-03-25 DIAGNOSIS — I951 Orthostatic hypotension: Secondary | ICD-10-CM | POA: Diagnosis not present

## 2017-03-25 DIAGNOSIS — Z944 Liver transplant status: Secondary | ICD-10-CM | POA: Diagnosis not present

## 2017-03-25 DIAGNOSIS — R2689 Other abnormalities of gait and mobility: Secondary | ICD-10-CM | POA: Diagnosis not present

## 2017-03-25 DIAGNOSIS — Z9482 Intestine transplant status: Secondary | ICD-10-CM | POA: Diagnosis not present

## 2017-03-25 DIAGNOSIS — W19XXXA Unspecified fall, initial encounter: Secondary | ICD-10-CM | POA: Diagnosis not present

## 2017-03-25 DIAGNOSIS — R55 Syncope and collapse: Secondary | ICD-10-CM | POA: Diagnosis not present

## 2017-03-25 DIAGNOSIS — S2232XA Fracture of one rib, left side, initial encounter for closed fracture: Secondary | ICD-10-CM | POA: Diagnosis not present

## 2017-03-25 DIAGNOSIS — D899 Disorder involving the immune mechanism, unspecified: Secondary | ICD-10-CM | POA: Diagnosis not present

## 2017-03-26 DIAGNOSIS — Z944 Liver transplant status: Secondary | ICD-10-CM | POA: Diagnosis not present

## 2017-03-26 DIAGNOSIS — I951 Orthostatic hypotension: Secondary | ICD-10-CM | POA: Diagnosis not present

## 2017-03-26 DIAGNOSIS — R0789 Other chest pain: Secondary | ICD-10-CM | POA: Diagnosis not present

## 2017-03-26 DIAGNOSIS — R2689 Other abnormalities of gait and mobility: Secondary | ICD-10-CM | POA: Diagnosis not present

## 2017-03-26 DIAGNOSIS — R569 Unspecified convulsions: Secondary | ICD-10-CM | POA: Diagnosis not present

## 2017-03-26 DIAGNOSIS — Z9482 Intestine transplant status: Secondary | ICD-10-CM | POA: Diagnosis not present

## 2017-03-26 DIAGNOSIS — R55 Syncope and collapse: Secondary | ICD-10-CM | POA: Diagnosis not present

## 2017-03-26 DIAGNOSIS — D899 Disorder involving the immune mechanism, unspecified: Secondary | ICD-10-CM | POA: Diagnosis not present

## 2017-03-26 LAB — CULTURE, BLOOD (ROUTINE X 2)
Culture: NO GROWTH
Culture: NO GROWTH
SPECIAL REQUESTS: ADEQUATE
SPECIAL REQUESTS: ADEQUATE

## 2017-03-27 DIAGNOSIS — Z944 Liver transplant status: Secondary | ICD-10-CM | POA: Diagnosis not present

## 2017-03-27 DIAGNOSIS — D899 Disorder involving the immune mechanism, unspecified: Secondary | ICD-10-CM | POA: Diagnosis not present

## 2017-03-27 DIAGNOSIS — R569 Unspecified convulsions: Secondary | ICD-10-CM | POA: Diagnosis not present

## 2017-03-27 DIAGNOSIS — R945 Abnormal results of liver function studies: Secondary | ICD-10-CM | POA: Diagnosis not present

## 2017-03-27 DIAGNOSIS — R55 Syncope and collapse: Secondary | ICD-10-CM | POA: Diagnosis not present

## 2017-03-28 DIAGNOSIS — R2689 Other abnormalities of gait and mobility: Secondary | ICD-10-CM | POA: Diagnosis not present

## 2017-03-28 DIAGNOSIS — E119 Type 2 diabetes mellitus without complications: Secondary | ICD-10-CM | POA: Diagnosis not present

## 2017-03-28 DIAGNOSIS — R945 Abnormal results of liver function studies: Secondary | ICD-10-CM | POA: Diagnosis not present

## 2017-03-28 DIAGNOSIS — Z944 Liver transplant status: Secondary | ICD-10-CM | POA: Diagnosis not present

## 2017-03-28 DIAGNOSIS — D899 Disorder involving the immune mechanism, unspecified: Secondary | ICD-10-CM | POA: Diagnosis not present

## 2017-03-28 DIAGNOSIS — R55 Syncope and collapse: Secondary | ICD-10-CM | POA: Diagnosis not present

## 2017-03-29 DIAGNOSIS — D899 Disorder involving the immune mechanism, unspecified: Secondary | ICD-10-CM | POA: Diagnosis not present

## 2017-03-29 DIAGNOSIS — R945 Abnormal results of liver function studies: Secondary | ICD-10-CM | POA: Diagnosis not present

## 2017-03-29 DIAGNOSIS — Z944 Liver transplant status: Secondary | ICD-10-CM | POA: Diagnosis not present

## 2017-03-29 DIAGNOSIS — R55 Syncope and collapse: Secondary | ICD-10-CM | POA: Diagnosis not present

## 2017-03-30 DIAGNOSIS — Z949 Transplanted organ and tissue status, unspecified: Secondary | ICD-10-CM | POA: Diagnosis not present

## 2017-03-30 DIAGNOSIS — R55 Syncope and collapse: Secondary | ICD-10-CM | POA: Diagnosis not present

## 2017-03-30 DIAGNOSIS — D899 Disorder involving the immune mechanism, unspecified: Secondary | ICD-10-CM | POA: Diagnosis not present

## 2017-03-30 DIAGNOSIS — R2689 Other abnormalities of gait and mobility: Secondary | ICD-10-CM | POA: Diagnosis not present

## 2017-03-30 DIAGNOSIS — Z944 Liver transplant status: Secondary | ICD-10-CM | POA: Diagnosis not present

## 2017-03-30 DIAGNOSIS — S2232XA Fracture of one rib, left side, initial encounter for closed fracture: Secondary | ICD-10-CM | POA: Diagnosis not present

## 2017-03-30 DIAGNOSIS — R945 Abnormal results of liver function studies: Secondary | ICD-10-CM | POA: Diagnosis not present

## 2017-04-03 DIAGNOSIS — Z7682 Awaiting organ transplant status: Secondary | ICD-10-CM | POA: Diagnosis not present

## 2017-04-03 DIAGNOSIS — D899 Disorder involving the immune mechanism, unspecified: Secondary | ICD-10-CM | POA: Diagnosis not present

## 2017-04-03 DIAGNOSIS — Z9885 Transplanted organ removal status: Secondary | ICD-10-CM | POA: Diagnosis not present

## 2017-04-10 DIAGNOSIS — Z7682 Awaiting organ transplant status: Secondary | ICD-10-CM | POA: Diagnosis not present

## 2017-04-16 ENCOUNTER — Emergency Department (HOSPITAL_COMMUNITY): Payer: Medicare Other

## 2017-04-16 ENCOUNTER — Other Ambulatory Visit: Payer: Self-pay

## 2017-04-16 ENCOUNTER — Emergency Department (HOSPITAL_COMMUNITY)
Admission: EM | Admit: 2017-04-16 | Discharge: 2017-04-16 | Disposition: A | Discharge status: Home / Self Care | Payer: Medicare Other | Attending: Emergency Medicine | Admitting: Emergency Medicine

## 2017-04-16 ENCOUNTER — Encounter (HOSPITAL_COMMUNITY): Payer: Self-pay | Admitting: Emergency Medicine

## 2017-04-16 DIAGNOSIS — Y92002 Bathroom of unspecified non-institutional (private) residence single-family (private) house as the place of occurrence of the external cause: Secondary | ICD-10-CM | POA: Insufficient documentation

## 2017-04-16 DIAGNOSIS — M25511 Pain in right shoulder: Secondary | ICD-10-CM | POA: Insufficient documentation

## 2017-04-16 DIAGNOSIS — N189 Chronic kidney disease, unspecified: Secondary | ICD-10-CM | POA: Insufficient documentation

## 2017-04-16 DIAGNOSIS — S42251A Displaced fracture of greater tuberosity of right humerus, initial encounter for closed fracture: Secondary | ICD-10-CM | POA: Diagnosis not present

## 2017-04-16 DIAGNOSIS — S4991XA Unspecified injury of right shoulder and upper arm, initial encounter: Secondary | ICD-10-CM | POA: Diagnosis present

## 2017-04-16 DIAGNOSIS — S59911A Unspecified injury of right forearm, initial encounter: Secondary | ICD-10-CM | POA: Diagnosis not present

## 2017-04-16 DIAGNOSIS — S299XXA Unspecified injury of thorax, initial encounter: Secondary | ICD-10-CM | POA: Diagnosis not present

## 2017-04-16 DIAGNOSIS — W19XXXA Unspecified fall, initial encounter: Secondary | ICD-10-CM

## 2017-04-16 DIAGNOSIS — E1122 Type 2 diabetes mellitus with diabetic chronic kidney disease: Secondary | ICD-10-CM | POA: Insufficient documentation

## 2017-04-16 DIAGNOSIS — S42301A Unspecified fracture of shaft of humerus, right arm, initial encounter for closed fracture: Secondary | ICD-10-CM | POA: Diagnosis not present

## 2017-04-16 DIAGNOSIS — Z7982 Long term (current) use of aspirin: Secondary | ICD-10-CM | POA: Insufficient documentation

## 2017-04-16 DIAGNOSIS — Z79899 Other long term (current) drug therapy: Secondary | ICD-10-CM | POA: Diagnosis not present

## 2017-04-16 DIAGNOSIS — I10 Essential (primary) hypertension: Secondary | ICD-10-CM | POA: Diagnosis not present

## 2017-04-16 DIAGNOSIS — W01198A Fall on same level from slipping, tripping and stumbling with subsequent striking against other object, initial encounter: Secondary | ICD-10-CM | POA: Diagnosis not present

## 2017-04-16 DIAGNOSIS — S42211A Unspecified displaced fracture of surgical neck of right humerus, initial encounter for closed fracture: Secondary | ICD-10-CM | POA: Diagnosis not present

## 2017-04-16 DIAGNOSIS — S42291A Other displaced fracture of upper end of right humerus, initial encounter for closed fracture: Secondary | ICD-10-CM | POA: Diagnosis not present

## 2017-04-16 DIAGNOSIS — Y93F9 Activity, other caregiving: Secondary | ICD-10-CM | POA: Insufficient documentation

## 2017-04-16 DIAGNOSIS — S0990XA Unspecified injury of head, initial encounter: Secondary | ICD-10-CM | POA: Insufficient documentation

## 2017-04-16 DIAGNOSIS — Z87891 Personal history of nicotine dependence: Secondary | ICD-10-CM | POA: Insufficient documentation

## 2017-04-16 DIAGNOSIS — Y998 Other external cause status: Secondary | ICD-10-CM | POA: Diagnosis not present

## 2017-04-16 LAB — URINALYSIS, ROUTINE W REFLEX MICROSCOPIC
BILIRUBIN URINE: NEGATIVE
Glucose, UA: NEGATIVE mg/dL
Hgb urine dipstick: NEGATIVE
KETONES UR: NEGATIVE mg/dL
LEUKOCYTES UA: NEGATIVE
NITRITE: NEGATIVE
PROTEIN: NEGATIVE mg/dL
Specific Gravity, Urine: 1.011 (ref 1.005–1.030)
pH: 5 (ref 5.0–8.0)

## 2017-04-16 LAB — CBC WITH DIFFERENTIAL/PLATELET
BASOS PCT: 1 %
Basophils Absolute: 0.1 10*3/uL (ref 0.0–0.1)
EOS ABS: 0.1 10*3/uL (ref 0.0–0.7)
Eosinophils Relative: 1 %
HCT: 28.8 % — ABNORMAL LOW (ref 36.0–46.0)
Hemoglobin: 8.6 g/dL — ABNORMAL LOW (ref 12.0–15.0)
LYMPHS ABS: 0.6 10*3/uL — AB (ref 0.7–4.0)
Lymphocytes Relative: 5 %
MCH: 23.9 pg — AB (ref 26.0–34.0)
MCHC: 29.9 g/dL — AB (ref 30.0–36.0)
MCV: 80 fL (ref 78.0–100.0)
Monocytes Absolute: 1.3 10*3/uL — ABNORMAL HIGH (ref 0.1–1.0)
Monocytes Relative: 11 %
NEUTROS PCT: 82 %
Neutro Abs: 10.1 10*3/uL — ABNORMAL HIGH (ref 1.7–7.7)
PLATELETS: 591 10*3/uL — AB (ref 150–400)
RBC: 3.6 MIL/uL — ABNORMAL LOW (ref 3.87–5.11)
RDW: 15.9 % — AB (ref 11.5–15.5)
WBC: 12.2 10*3/uL — AB (ref 4.0–10.5)

## 2017-04-16 LAB — BASIC METABOLIC PANEL
ANION GAP: 11 (ref 5–15)
BUN: 36 mg/dL — ABNORMAL HIGH (ref 6–20)
CO2: 19 mmol/L — ABNORMAL LOW (ref 22–32)
CREATININE: 1.42 mg/dL — AB (ref 0.44–1.00)
Calcium: 9.2 mg/dL (ref 8.9–10.3)
Chloride: 110 mmol/L (ref 101–111)
GFR, EST AFRICAN AMERICAN: 47 mL/min — AB (ref >60)
GFR, EST NON AFRICAN AMERICAN: 41 mL/min — AB (ref >60)
GLUCOSE: 112 mg/dL — AB (ref 65–99)
Potassium: 4.1 mmol/L (ref 3.5–5.1)
Sodium: 140 mmol/L (ref 135–145)

## 2017-04-16 LAB — TROPONIN I: Troponin I: <0.03 ng/mL (ref <0.03)

## 2017-04-16 MED ORDER — OXYCODONE HCL 5 MG PO TABS
5.0000 mg | ORAL_TABLET | Freq: Once | ORAL | Status: AC
  Administered 2017-04-16: 5 mg via ORAL
  Filled 2017-04-16: qty 1

## 2017-04-16 MED ORDER — FENTANYL CITRATE (PF) 100 MCG/2ML IJ SOLN
50.0000 ug | INTRAMUSCULAR | Status: AC | PRN
  Administered 2017-04-16 (×2): 50 ug via INTRAVENOUS
  Filled 2017-04-16 (×2): qty 2

## 2017-04-16 MED ORDER — MORPHINE SULFATE (PF) 2 MG/ML IV SOLN
2.0000 mg | INTRAVENOUS | Status: AC | PRN
  Administered 2017-04-16 (×2): 2 mg via INTRAVENOUS
  Filled 2017-04-16 (×2): qty 1

## 2017-04-16 MED ORDER — OXYCODONE HCL 5 MG PO TABS: 5.0000 mg | ORAL_TABLET | Freq: Four times a day (QID) | ORAL | 0 refills | Status: DC | PRN

## 2017-04-16 MED ORDER — SODIUM CHLORIDE 0.9 % IV BOLUS (SEPSIS)
500.0000 mL | Freq: Once | INTRAVENOUS | Status: AC
  Administered 2017-04-16: 500 mL via INTRAVENOUS

## 2017-04-16 NOTE — ED Provider Notes (Signed)
[facility] EMERGENCY DEPARTMENT Provider Note   CSN: [id number] Arrival date & time: 04/16/17  2426     History   Chief Complaint Chief Complaint  Patient presents with  . Fall    HPI [name] is a 57 y.o. female.  HPI  Pt was seen at 0735. Per EMS, pt's family and pt report, c/o sudden onset and resolution of one episode of fall that occurred this morning approximately 0400. Pt states she was sitting on the toilet and when she stood up she "maybe blacked out" briefly; but she is unsure. Pt states she fell onto her right shoulder and c/o right shoulder pain which increases with palpation of the area and movement of her shoulder. Pt has been ambulatory since the fall. Pt states she has hx of same symptoms that her "transplant team at [facility] is working up." Pt states they "think it's because my blood pressure drops when I stand up and they're referring me to a Neurologist." Denies AMS, no CP/SOB, no fevers, no abd pain, no N/V/D, no focal motor weakness, no tingling/numbness in extremities, no neck or back pain.   Past Medical History:  Diagnosis Date  . Anemia   . Anxiety   . Ascites   . 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 Complication of anesthesia   . Depression   . Diabetes mellitus type 2, controlled, without complications ([place])   . Enteropathy 07/31/2015   Portal hypertensive enteropathy per capsule study, with stigmata of bleeding.  . Esophageal varices ([place])   . Gastric ulcer 07/15/2015   per EGD; no stigmata of bleeding  . GI bleed 07/17/2015  . Gram-negative bacteremia 12/08/2011  . STMHDQQI(297.9)    "monthly" (11/07/2012)  . Heart murmur   . History of hepatitis C - successfuly treated medically 07/14/2015  . Kidney stones   . Liver failure ([place])    "I'm on liver transplant list @ [facility]" (11/07/2012)  . PONV (postoperative nausea and vomiting)   . Portal vein thrombosis   . Renal insufficiency   . Right ureteral stone 12/07/2011  . 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 Type II diabetes mellitus ([place])    "not since gastric bypass" (11/07/2012)    Patient Active Problem List   Diagnosis Date Noted  . Diarrhea 03/11/2017  . Nausea & vomiting 03/11/2017  . Sepsis ([place]) 03/11/2017  . Dehydration 03/11/2017  . Immunosuppressed status ([place])   . Influenza-like illness 03/08/2017  . Acute bronchitis, viral 03/08/2017  . Anemia 03/06/2017  . Headache(784.0) 03/06/2017  . Elevated blood pressure reading 03/06/2017  . Liver transplant recipient [facility]) 03/06/2017  . 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 Acute viral syndrome 03/06/2017  . Pancytopenia ([place]) 11/13/2015  . Cirrhosis of liver with ascites ([place])   . Abdominal pain 11/10/2015  . Alcoholic cirrhosis of liver with ascites ([place])   . Enteropathy 07/31/2015  . Anemia, blood loss   . Depression with anxiety 07/30/2015  . Esophageal varices in cirrhosis ([place]) 07/30/2015  . Gastritis determined by endoscopy 07/30/2015  . Hypotension 07/30/2015  . Hyponatremia 07/29/2015  . Hypokalemia 07/29/2015  . GI bleeding 07/29/2015  . Gastric ulcer 07/29/2015  . Leukopenia 07/29/2015  . AKI (acute kidney injury) ([place]) 07/29/2015  . Diabetes mellitus type 2, controlled, without complications ([place]) 89/21/1940  . GI bleed 07/17/2015  . Palliative care encounter   . Goals of care, counseling/discussion   . DNR (do not resuscitate) discussion   . Superior  mesenteric vein thrombosis 07/14/2015  . Melena 07/14/2015  . Hepatic cirrhosis due to chronic hepatitis C infection ([place]) 07/14/2015  . History of hepatitis C - successfuly treated medically 07/14/2015  . Portal vein thrombosis 05/13/2014  . Other malaise and fatigue 08/05/2013  . Hypoxemia 08/05/2013  . Abdominal pain, right upper quadrant 05/29/2013  . SBP (spontaneous bacterial peritonitis) ([place]) 05/28/2013  . Distal radius fracture 10/25/2012  . Metatarsal bone fracture 10/25/2012  . Insomnia 09/25/2012  . Hepatic  encephalopathy ([place]) 06/07/2012  . Hyperglycemia 06/06/2012  . LUQ abdominal pain 06/05/2012  . Ascites 05/14/2012  . Gram-negative bacteremia 12/08/2011  . Pyelonephritis 12/07/2011  . Hydronephrosis of right kidney 12/07/2011  . Right ureteral stone 12/07/2011  . Thrombocytopenia ([place]) 12/07/2011  . Cirrhosis of liver ([place]) 05/02/2011  . Acute blood loss anemia 05/02/2011    Past Surgical History:  Procedure Laterality Date  . ABDOMINAL HYSTERECTOMY  2003  . CESAREAN SECTION  1995  . CHOLECYSTECTOMY  1987  . COLONOSCOPY    . COLONOSCOPY  10/21/2011   Procedure: COLONOSCOPY;  Surgeon: Rogene Houston, MD;  Location: [facility];  Service: Endoscopy;  Laterality: N/A;  245   . CYSTOSCOPY W/ RETROGRADES  12/08/2011   Procedure: CYSTOSCOPY WITH RETROGRADE PYELOGRAM;  Surgeon: Marissa Nestle, MD;  Location: [facility] ORS;  Service: Urology;  Laterality: Right;  . CYSTOSCOPY WITH STENT PLACEMENT  12/08/2011   Procedure: CYSTOSCOPY WITH STENT PLACEMENT;  Surgeon: Marissa Nestle, MD;  Location: [facility] ORS;  Service: Urology;  Laterality: Right;  . DILATION AND CURETTAGE OF UTERUS    . ESOPHAGOGASTRODUODENOSCOPY N/A 02/19/2014   Procedure: ESOPHAGOGASTRODUODENOSCOPY (EGD);  Surgeon: Rogene Houston, MD;  Location: [facility] END[facility];  Service: Endoscopy;  Laterality: N/A;  100  . ESOPHAGOGASTRODUODENOSCOPY N/A 07/15/2015   Procedure: ESOPHAGOGASTRODUODENOSCOPY (EGD);  Surgeon: Rogene Houston, MD;  Location: [facility];  Service: Endoscopy;  Laterality: N/A;  . ESOPHAGOGASTRODUODENOSCOPY N/A 07/29/2015   Procedure: ESOPHAGOGASTRODUODENOSCOPY (EGD);  Surgeon: Rogene Houston, MD;  Location: [facility];  Service: Endoscopy;  Laterality: N/A;  . GASTRIC BYPASS  ~ 1995  . HERNIA REPAIR  04/14/11   "one in my bellybutton" (11/07/2012)  . INGUINAL HERNIA REPAIR Right    "maybe 2" (11/07/2012)  . KNEE ARTHROSCOPY Right   . LITHOTRIPSY     "several times" (11/07/2012)  . OPEN REDUCTION INTERNAL  FIXATION (ORIF) DISTAL RADIAL FRACTURE Right 11/07/2012   Procedure: OPEN REDUCTION INTERNAL FIXATION (ORIF) RIGHT DISTAL RADIAL FRACTURE;  Surgeon: [name], MD;  Location: [place];  Service: Orthopedics;  Laterality: Right;  . ORIF DISTAL RADIUS FRACTURE Right 11/07/2012  . OTHER SURGICAL HISTORY  08/14/2016   5 organ transplant- liver, stomach, pancreas, small and large intestine  . UPPER GASTROINTESTINAL ENDOSCOPY      OB History    Gravida Para Term Preterm AB Living   1 1 1          SAB TAB Ectopic Multiple Live Births                   Home Medications    Prior to Admission medications   Medication Sig Start Date End Date Taking? Authorizing Provider  amLODipine (NORVASC) 10 MG tablet Take 1 tablet by mouth daily. 03/18/17   [provider]  amoxicillin-clavulanate (AUGMENTIN) 875-125 MG tablet Take 1 tablet by mouth 2 (two) times daily. 03/18/17   [provider]  aspirin EC 81 MG tablet Take 81 mg by mouth  daily.    [provider]  cycloSPORINE modified (GENGRAF) 100 MG capsule Take 150 mg by mouth See admin instructions. Gengraf  300mg  every morning and 200mg  every night    [provider]  DULoxetine (CYMBALTA) 60 MG capsule Take 60 mg by mouth daily.    [provider]  fexofenadine (ALLEGRA) 60 MG tablet Take 1 tablet by mouth daily. 03/19/17 03/19/18  [provider]  loperamide (IMODIUM) 2 MG capsule Take 1 capsule (2 mg total) by mouth every 12 (twelve) hours as needed for diarrhea or loose stools. 03/12/17   Nita Sells, MD  magnesium oxide (MAG-OX) 400 MG tablet Take 1,200 mg by mouth 2 (two) times daily.    [provider]  mycophenolate (CELLCEPT) 250 MG capsule Take 750 mg by mouth every 12 (twelve) hours.     [provider]  nortriptyline (PAMELOR) 50 MG capsule Take 50 mg by mouth at bedtime.    [provider]  ondansetron (ZOFRAN) 4 MG tablet Take 1 tablet (4 mg total) by  mouth every 6 (six) hours as needed for nausea. 03/12/17   Nita Sells, MD  oseltamivir (TAMIFLU) 75 MG capsule Take 1 capsule by mouth daily. 03/18/17   [provider]  pantoprazole (PROTONIX) 40 MG tablet Take 1 tablet (40 mg total) by mouth 2 (two) times daily before a meal. 11/17/16   Rehman, Mechele Dawley, MD  predniSONE (DELTASONE) 5 MG tablet Take 5 mg by mouth daily with breakfast.     [provider]  pregabalin (LYRICA) 50 MG capsule Take 50 mg by mouth 3 (three) times daily.    [provider]  prochlorperazine (COMPAZINE) 10 MG tablet Take 10 mg by mouth every 8 (eight) hours as needed for nausea or vomiting.    [provider]  QUEtiapine (SEROQUEL) 50 MG tablet Take 50 mg by mouth every 8 (eight) hours.    [provider]  sodium bicarbonate 650 MG tablet Take 1,300 mg by mouth 2 (two) times daily.    [provider]  sulfamethoxazole-trimethoprim (BACTRIM DS,SEPTRA DS) 800-160 MG tablet Take 1 tablet by mouth every Monday, Wednesday, and Friday.    [provider]    Family History Family History  Problem Relation Age of Onset  . Dementia Mother   . Heart disease Father   . Liver disease Father   . Hypertension Father   . Diabetes Father   . Dementia Father   . Hypertension Brother   . Other Son        Cervical dystonia    Social History Social History   Tobacco Use  . Smoking status: Former Smoker    Packs/day: 0.50    Years: 5.00    Pack years: 2.50    Types: Cigarettes    Last attempt to quit: 05/02/1991    Years since quitting: 25.9  . Smokeless tobacco: Never Used  Substance Use Topics  . Alcohol use: No    Alcohol/week: 0.0 oz  . Drug use: No     Allergies   Ancef [cefazolin sodium]; Tapentadol; Gadobenate; Iodinated diagnostic agents; and Tape   Review of Systems Review of Systems ROS: Statement: All systems negative except as marked or noted in the HPI; Constitutional: Negative for  fever and chills. ; ; Eyes: Negative for eye pain, redness and discharge. ; ; ENMT: Negative for ear pain, hoarseness, nasal congestion, sinus pressure and sore throat. ; ; Cardiovascular: Negative for chest pain, palpitations, diaphoresis, dyspnea and  peripheral edema. ; ; Respiratory: Negative for cough, wheezing and stridor. ; ; Gastrointestinal: Negative for nausea, vomiting, diarrhea, abdominal pain, blood in stool, hematemesis, jaundice and rectal bleeding. . ; ; Genitourinary: Negative for dysuria, flank pain and hematuria. ; ; Musculoskeletal: Negative for back pain and neck pain. +right shoulder pain..; ; Skin: Negative for pruritus, rash, abrasions, blisters, bruising and skin lesion.; ; Neuro: Negative for headache, lightheadedness and neck stiffness. Negative for weakness, extremity weakness, paresthesias, involuntary movement, seizure and +?brief syncope.       Physical Exam Updated Vital Signs BP (!) 183/105 (BP Location: Left Arm)   Pulse 87   Temp 98 F (36.7 C) (Oral)   Resp 18   Ht 5\' 6"  (1.676 m)   Wt 82.6 kg (182 lb)   SpO2 97%   BMI 29.38 kg/m    09:13 Orthostatic Vital Signs FS  Orthostatic Lying   BP- Lying:  184/107  Pulse- Lying: 89      Orthostatic Sitting  BP- Sitting:  161/122  Pulse- Sitting: 86      Orthostatic Standing at 0 minutes  BP- Standing at 0 minutes:  147/109  Pulse- Standing at 0 minutes: 94      Physical Exam 0740: Physical examination:  Nursing notes reviewed; Vital signs and O2 SAT reviewed;  Constitutional: Well developed, Well nourished, Well hydrated, In no acute distress; Head:  Normocephalic, atraumatic; Eyes: EOMI, PERRL, No scleral icterus; ENMT: Mouth and pharynx normal, Mucous membranes moist; Neck: Supple, Full range of motion, No lymphadenopathy; Cardiovascular: Regular rate and rhythm, No gallop; Respiratory: Breath sounds clear & equal bilaterally, No wheezes.  Speaking full sentences with ease, Normal respiratory  effort/excursion; Chest: Nontender, Movement normal; Abdomen: Soft, Nontender, Nondistended, Normal bowel sounds; Genitourinary: No CVA tenderness; Spine:  No midline CS, TS, LS tenderness.;; Extremities: Pulses normal, +right shoulder/proximal humeral area with generalized TTP, no right clavicle tenderness, no scapular tenderness, no rash, no ecchymosis, no abrasions, no obvious deformity, no open wounds. NT right elbow/wrist/hand/fingers. NMS intact right hand. Strong radial pulse, muscles compartments soft.  Pelvis stable. NT bilat hips/knees/ankles/feet. No calf edema or asymmetry.; Neuro: AA&Ox3, Major CN grossly intact.  Speech clear. Decreased ROM right shoulder, otherwise no gross focal motor or sensory deficits in extremities.; Skin: Color normal, Warm, Dry.   ED Treatments / Results  Labs (all labs ordered are listed, but only abnormal results are displayed)   EKG  EKG Interpretation  Date/Time:  Sunday April 16 2017 07:28:18 EDT Ventricular Rate:  78 PR Interval:    QRS Duration: 101 QT Interval:  400 QTC Calculation: 456 R Axis:   51 Text Interpretation:  Normal sinus rhythm Abnormal inferior Q waves Artifact When compared with ECG of 03/18/2000 No significant change was found Confirmed by Francine Graven [phone]) on 04/16/2017 8:05:18 AM       Radiology   Procedures Procedures (including critical care time)  Medications Ordered in ED Medications  morphine 2 MG/ML injection 2 mg (not administered)     Initial Impression / Assessment and Plan / ED Course  I have reviewed the triage vital signs and the nursing notes.  Pertinent labs & imaging results that were available during my care of the patient were reviewed by me and considered in my medical decision making (see chart for details).  MDM Reviewed: previous chart, nursing note and vitals Reviewed previous: labs and ECG Interpretation: labs, ECG, x-ray and CT scan   Results for orders placed or performed  during the hospital  encounter of 04/16/17  Urinalysis, Routine w reflex microscopic  Result Value Ref Range   Color, Urine STRAW (A) YELLOW   APPearance CLEAR CLEAR   Specific Gravity, Urine 1.011 1.005 - 1.030   pH 5.0 5.0 - 8.0   Glucose, UA NEGATIVE NEGATIVE mg/dL   Hgb urine dipstick NEGATIVE NEGATIVE   Bilirubin Urine NEGATIVE NEGATIVE   Ketones, ur NEGATIVE NEGATIVE mg/dL   Protein, ur NEGATIVE NEGATIVE mg/dL   Nitrite NEGATIVE NEGATIVE   Leukocytes, UA NEGATIVE NEGATIVE  Basic metabolic panel  Result Value Ref Range   Sodium 140 135 - 145 mmol/L   Potassium 4.1 3.5 - 5.1 mmol/L   Chloride 110 101 - 111 mmol/L   CO2 19 (L) 22 - 32 mmol/L   Glucose, Bld 112 (H) 65 - 99 mg/dL   BUN 36 (H) 6 - 20 mg/dL   Creatinine, Ser 1.42 (H) 0.44 - 1.00 mg/dL   Calcium 9.2 8.9 - 10.3 mg/dL   GFR calc non Af Amer 41 (L) >60 mL/min   GFR calc Af Amer 47 (L) >60 mL/min   Anion gap 11 5 - 15  Troponin I  Result Value Ref Range   Troponin I <0.03 <0.03 ng/mL  CBC with Differential  Result Value Ref Range   WBC 12.2 (H) 4.0 - 10.5 K/uL   RBC 3.60 (L) 3.87 - 5.11 MIL/uL   Hemoglobin 8.6 (L) 12.0 - 15.0 g/dL   HCT 28.8 (L) 36.0 - 46.0 %   MCV 80.0 78.0 - 100.0 fL   MCH 23.9 (L) 26.0 - 34.0 pg   MCHC 29.9 (L) 30.0 - 36.0 g/dL   RDW 15.9 (H) 11.5 - 15.5 %   Platelets 591 (H) 150 - 400 K/uL   Neutrophils Relative % 82 %   Lymphocytes Relative 5 %   Monocytes Relative 11 %   Eosinophils Relative 1 %   Basophils Relative 1 %   Neutro Abs 10.1 (H) 1.7 - 7.7 K/uL   Lymphs Abs 0.6 (L) 0.7 - 4.0 K/uL   Monocytes Absolute 1.3 (H) 0.1 - 1.0 K/uL   Eosinophils Absolute 0.1 0.0 - 0.7 K/uL   Basophils Absolute 0.1 0.0 - 0.1 K/uL   WBC Morphology WHITE COUNT CONFIRMED ON SMEAR    Dg Chest 1 View Result Date: 04/16/2017 CLINICAL DATA:  Fall EXAM: CHEST 1 VIEW COMPARISON:  03/21/2017 FINDINGS: Heart is borderline enlarged. Lungs are clear. No effusions or edema. No acute bony abnormality.  IMPRESSION: Borderline heart size.  No active disease. Electronically Signed   By: Rolm Baptise M.D.   On: 04/16/2017 08:48   Dg Shoulder Right Result Date: 04/16/2017 CLINICAL DATA:  Fall EXAM: RIGHT SHOULDER - 2+ VIEW COMPARISON:  06/05/2014 FINDINGS: There is a right humeral neck fracture which extends to involve the greater tuberosity with mild displacement. No dislocation. IMPRESSION: Right humeral neck fracture.  Mildly displaced greater tuberosity. Electronically Signed   By: Rolm Baptise M.D.   On: 04/16/2017 08:44   Dg Elbow Complete Right Result Date: 04/16/2017 CLINICAL DATA:  Fall.  Right arm pain EXAM: RIGHT ELBOW - COMPLETE 3+ VIEW COMPARISON:  None. FINDINGS: No acute bony abnormality. Specifically, no fracture, subluxation, or dislocation. No joint effusion. IMPRESSION: No acute bony abnormality. Electronically Signed   By: Rolm Baptise M.D.   On: 04/16/2017 08:47   Ct Head Wo Contrast Result Date: 04/16/2017 CLINICAL DATA:  Fall.  Head trauma. EXAM: CT HEAD WITHOUT CONTRAST TECHNIQUE: Contiguous axial images were obtained from  the base of the skull through the vertex without intravenous contrast. COMPARISON:  06/23/2016 FINDINGS: Brain: No acute intracranial abnormality. Specifically, no hemorrhage, hydrocephalus, mass lesion, acute infarction, or significant intracranial injury. Vascular: No hyperdense vessel or unexpected calcification. Skull: No acute calvarial abnormality. Sinuses/Orbits: No acute finding Other: None IMPRESSION: Normal study. Electronically Signed   By: Rolm Baptise M.D.   On: 04/16/2017 08:39    1115:   Pt not orthostatic during VS. H/H, CO2, BUN/Cr per previous range on file. XR as above; sling applied. IV and PO pain meds given with improvement of pain.  Pt states she is ready to go home now. Dx and testing d/w pt and family.  Questions answered.  Verb understanding, agreeable to d/c home with outpt f/u.      Final Clinical Impressions(s) / ED Diagnoses    Final diagnoses:  None    ED Discharge Orders    None       Francine Graven, DO 04/17/17 2220

## 2017-04-16 NOTE — ED Triage Notes (Signed)
Patient brought in via EMS from home. Alert and oriented. Patient c/o right shoulder pain due to fall this morning. Patient states fell after standing up from toilet. Patient unsure of hitting head. Patient states that she believes she "blacked out" when she stood up. Patient reports being treated at [facility] for intermittent syncopal episodes.

## 2017-04-16 NOTE — Discharge Instructions (Signed)
Take the prescription as directed.  Apply moist heat or ice to the area(s) of discomfort, for 15 minutes at a time, several times per day for the next few days.  Do not fall asleep on a heating or ice pack.  Wear the sling until you are seen in follow up. Call the Orthopedic doctor tomorrow to schedule a follow up appointment within the next 3 days.  Return to the Emergency Department immediately if worsening.

## 2017-04-18 DIAGNOSIS — E559 Vitamin D deficiency, unspecified: Secondary | ICD-10-CM | POA: Diagnosis not present

## 2017-04-18 DIAGNOSIS — G8929 Other chronic pain: Secondary | ICD-10-CM | POA: Diagnosis not present

## 2017-04-18 DIAGNOSIS — G8918 Other acute postprocedural pain: Secondary | ICD-10-CM | POA: Diagnosis not present

## 2017-04-18 DIAGNOSIS — Z87442 Personal history of urinary calculi: Secondary | ICD-10-CM | POA: Diagnosis not present

## 2017-04-18 DIAGNOSIS — D638 Anemia in other chronic diseases classified elsewhere: Secondary | ICD-10-CM | POA: Diagnosis not present

## 2017-04-18 DIAGNOSIS — S42291A Other displaced fracture of upper end of right humerus, initial encounter for closed fracture: Secondary | ICD-10-CM | POA: Diagnosis not present

## 2017-04-18 DIAGNOSIS — B182 Chronic viral hepatitis C: Secondary | ICD-10-CM | POA: Diagnosis not present

## 2017-04-18 DIAGNOSIS — E119 Type 2 diabetes mellitus without complications: Secondary | ICD-10-CM | POA: Diagnosis not present

## 2017-04-18 DIAGNOSIS — Z7982 Long term (current) use of aspirin: Secondary | ICD-10-CM | POA: Diagnosis not present

## 2017-04-18 DIAGNOSIS — K7460 Unspecified cirrhosis of liver: Secondary | ICD-10-CM | POA: Diagnosis not present

## 2017-04-18 DIAGNOSIS — Z79899 Other long term (current) drug therapy: Secondary | ICD-10-CM | POA: Diagnosis not present

## 2017-04-18 DIAGNOSIS — W19XXXA Unspecified fall, initial encounter: Secondary | ICD-10-CM | POA: Diagnosis not present

## 2017-04-18 DIAGNOSIS — S2232XA Fracture of one rib, left side, initial encounter for closed fracture: Secondary | ICD-10-CM | POA: Diagnosis not present

## 2017-04-18 DIAGNOSIS — Z87891 Personal history of nicotine dependence: Secondary | ICD-10-CM | POA: Diagnosis not present

## 2017-04-18 DIAGNOSIS — S42201A Unspecified fracture of upper end of right humerus, initial encounter for closed fracture: Secondary | ICD-10-CM | POA: Diagnosis not present

## 2017-04-18 DIAGNOSIS — D899 Disorder involving the immune mechanism, unspecified: Secondary | ICD-10-CM | POA: Diagnosis not present

## 2017-04-18 DIAGNOSIS — Z9884 Bariatric surgery status: Secondary | ICD-10-CM | POA: Diagnosis not present

## 2017-04-18 DIAGNOSIS — R55 Syncope and collapse: Secondary | ICD-10-CM | POA: Diagnosis not present

## 2017-04-18 LAB — URINE CULTURE

## 2017-04-19 DIAGNOSIS — S42201A Unspecified fracture of upper end of right humerus, initial encounter for closed fracture: Secondary | ICD-10-CM | POA: Diagnosis not present

## 2017-04-19 DIAGNOSIS — G8911 Acute pain due to trauma: Secondary | ICD-10-CM | POA: Diagnosis not present

## 2017-04-19 DIAGNOSIS — S2232XA Fracture of one rib, left side, initial encounter for closed fracture: Secondary | ICD-10-CM | POA: Diagnosis not present

## 2017-04-19 DIAGNOSIS — D649 Anemia, unspecified: Secondary | ICD-10-CM | POA: Diagnosis not present

## 2017-04-19 DIAGNOSIS — R197 Diarrhea, unspecified: Secondary | ICD-10-CM | POA: Diagnosis not present

## 2017-04-19 DIAGNOSIS — I951 Orthostatic hypotension: Secondary | ICD-10-CM | POA: Diagnosis not present

## 2017-04-19 DIAGNOSIS — R55 Syncope and collapse: Secondary | ICD-10-CM | POA: Diagnosis not present

## 2017-04-19 DIAGNOSIS — D899 Disorder involving the immune mechanism, unspecified: Secondary | ICD-10-CM | POA: Diagnosis not present

## 2017-04-19 DIAGNOSIS — S42291A Other displaced fracture of upper end of right humerus, initial encounter for closed fracture: Secondary | ICD-10-CM | POA: Diagnosis not present

## 2017-04-20 DIAGNOSIS — D649 Anemia, unspecified: Secondary | ICD-10-CM | POA: Diagnosis not present

## 2017-04-20 DIAGNOSIS — R55 Syncope and collapse: Secondary | ICD-10-CM | POA: Diagnosis not present

## 2017-04-20 DIAGNOSIS — S42291A Other displaced fracture of upper end of right humerus, initial encounter for closed fracture: Secondary | ICD-10-CM | POA: Diagnosis not present

## 2017-04-20 DIAGNOSIS — I951 Orthostatic hypotension: Secondary | ICD-10-CM | POA: Diagnosis not present

## 2017-04-20 DIAGNOSIS — S2232XA Fracture of one rib, left side, initial encounter for closed fracture: Secondary | ICD-10-CM | POA: Diagnosis not present

## 2017-04-20 DIAGNOSIS — G8911 Acute pain due to trauma: Secondary | ICD-10-CM | POA: Diagnosis not present

## 2017-04-20 DIAGNOSIS — R188 Other ascites: Secondary | ICD-10-CM | POA: Diagnosis not present

## 2017-04-20 DIAGNOSIS — R197 Diarrhea, unspecified: Secondary | ICD-10-CM | POA: Diagnosis not present

## 2017-04-20 DIAGNOSIS — R7989 Other specified abnormal findings of blood chemistry: Secondary | ICD-10-CM | POA: Diagnosis not present

## 2017-04-20 DIAGNOSIS — D899 Disorder involving the immune mechanism, unspecified: Secondary | ICD-10-CM | POA: Diagnosis not present

## 2017-04-20 DIAGNOSIS — R945 Abnormal results of liver function studies: Secondary | ICD-10-CM | POA: Diagnosis not present

## 2017-04-21 DIAGNOSIS — D899 Disorder involving the immune mechanism, unspecified: Secondary | ICD-10-CM | POA: Diagnosis not present

## 2017-04-21 DIAGNOSIS — S2232XA Fracture of one rib, left side, initial encounter for closed fracture: Secondary | ICD-10-CM | POA: Diagnosis not present

## 2017-04-21 DIAGNOSIS — R55 Syncope and collapse: Secondary | ICD-10-CM | POA: Diagnosis not present

## 2017-04-21 DIAGNOSIS — I951 Orthostatic hypotension: Secondary | ICD-10-CM | POA: Diagnosis not present

## 2017-04-21 DIAGNOSIS — S42291A Other displaced fracture of upper end of right humerus, initial encounter for closed fracture: Secondary | ICD-10-CM | POA: Diagnosis not present

## 2017-04-21 DIAGNOSIS — G8911 Acute pain due to trauma: Secondary | ICD-10-CM | POA: Diagnosis not present

## 2017-04-21 DIAGNOSIS — D649 Anemia, unspecified: Secondary | ICD-10-CM | POA: Diagnosis not present

## 2017-04-21 DIAGNOSIS — R197 Diarrhea, unspecified: Secondary | ICD-10-CM | POA: Diagnosis not present

## 2017-04-24 DIAGNOSIS — Z7952 Long term (current) use of systemic steroids: Secondary | ICD-10-CM | POA: Diagnosis not present

## 2017-04-24 DIAGNOSIS — M25511 Pain in right shoulder: Secondary | ICD-10-CM | POA: Diagnosis not present

## 2017-04-24 DIAGNOSIS — G8911 Acute pain due to trauma: Secondary | ICD-10-CM | POA: Diagnosis not present

## 2017-04-24 DIAGNOSIS — R2689 Other abnormalities of gait and mobility: Secondary | ICD-10-CM | POA: Diagnosis not present

## 2017-04-24 DIAGNOSIS — W19XXXD Unspecified fall, subsequent encounter: Secondary | ICD-10-CM | POA: Diagnosis not present

## 2017-04-24 DIAGNOSIS — Z949 Transplanted organ and tissue status, unspecified: Secondary | ICD-10-CM | POA: Diagnosis not present

## 2017-04-24 DIAGNOSIS — Z79899 Other long term (current) drug therapy: Secondary | ICD-10-CM | POA: Diagnosis not present

## 2017-04-24 DIAGNOSIS — S42211D Unspecified displaced fracture of surgical neck of right humerus, subsequent encounter for fracture with routine healing: Secondary | ICD-10-CM | POA: Diagnosis not present

## 2017-04-25 DIAGNOSIS — S42291A Other displaced fracture of upper end of right humerus, initial encounter for closed fracture: Secondary | ICD-10-CM | POA: Diagnosis not present

## 2017-04-25 DIAGNOSIS — S42294D Other nondisplaced fracture of upper end of right humerus, subsequent encounter for fracture with routine healing: Secondary | ICD-10-CM | POA: Diagnosis not present

## 2017-04-25 DIAGNOSIS — Y33XXXA Other specified events, undetermined intent, initial encounter: Secondary | ICD-10-CM | POA: Diagnosis not present

## 2017-05-04 DIAGNOSIS — Z9885 Transplanted organ removal status: Secondary | ICD-10-CM | POA: Diagnosis not present

## 2017-05-17 DIAGNOSIS — E663 Overweight: Secondary | ICD-10-CM | POA: Diagnosis not present

## 2017-05-17 DIAGNOSIS — I10 Essential (primary) hypertension: Secondary | ICD-10-CM | POA: Diagnosis not present

## 2017-05-17 DIAGNOSIS — Z6831 Body mass index (BMI) 31.0-31.9, adult: Secondary | ICD-10-CM | POA: Diagnosis not present

## 2017-05-17 DIAGNOSIS — G894 Chronic pain syndrome: Secondary | ICD-10-CM | POA: Diagnosis not present

## 2017-05-18 DIAGNOSIS — Z7682 Awaiting organ transplant status: Secondary | ICD-10-CM | POA: Diagnosis not present

## 2017-05-18 DIAGNOSIS — Z9482 Intestine transplant status: Secondary | ICD-10-CM | POA: Diagnosis not present

## 2017-05-18 DIAGNOSIS — D899 Disorder involving the immune mechanism, unspecified: Secondary | ICD-10-CM | POA: Diagnosis not present

## 2017-06-09 DIAGNOSIS — R55 Syncope and collapse: Secondary | ICD-10-CM | POA: Diagnosis not present

## 2017-06-09 DIAGNOSIS — Z87442 Personal history of urinary calculi: Secondary | ICD-10-CM | POA: Diagnosis not present

## 2017-06-09 DIAGNOSIS — D899 Disorder involving the immune mechanism, unspecified: Secondary | ICD-10-CM | POA: Diagnosis not present

## 2017-06-09 DIAGNOSIS — I951 Orthostatic hypotension: Secondary | ICD-10-CM | POA: Diagnosis not present

## 2017-06-09 DIAGNOSIS — K9189 Other postprocedural complications and disorders of digestive system: Secondary | ICD-10-CM | POA: Diagnosis not present

## 2017-06-09 DIAGNOSIS — Z9482 Intestine transplant status: Secondary | ICD-10-CM | POA: Diagnosis not present

## 2017-06-09 DIAGNOSIS — R112 Nausea with vomiting, unspecified: Secondary | ICD-10-CM | POA: Diagnosis not present

## 2017-06-09 DIAGNOSIS — Z944 Liver transplant status: Secondary | ICD-10-CM | POA: Diagnosis not present

## 2017-06-09 DIAGNOSIS — Z9483 Pancreas transplant status: Secondary | ICD-10-CM | POA: Diagnosis not present

## 2017-06-09 DIAGNOSIS — R0789 Other chest pain: Secondary | ICD-10-CM | POA: Diagnosis not present

## 2017-06-09 DIAGNOSIS — Z7982 Long term (current) use of aspirin: Secondary | ICD-10-CM | POA: Diagnosis not present

## 2017-06-09 DIAGNOSIS — N200 Calculus of kidney: Secondary | ICD-10-CM | POA: Diagnosis not present

## 2017-06-09 DIAGNOSIS — R188 Other ascites: Secondary | ICD-10-CM | POA: Diagnosis not present

## 2017-06-09 DIAGNOSIS — Z980 Intestinal bypass and anastomosis status: Secondary | ICD-10-CM | POA: Diagnosis not present

## 2017-06-09 DIAGNOSIS — Z91041 Radiographic dye allergy status: Secondary | ICD-10-CM | POA: Diagnosis not present

## 2017-06-09 DIAGNOSIS — R197 Diarrhea, unspecified: Secondary | ICD-10-CM | POA: Diagnosis not present

## 2017-06-09 DIAGNOSIS — R509 Fever, unspecified: Secondary | ICD-10-CM | POA: Diagnosis not present

## 2017-06-09 DIAGNOSIS — Z9489 Other transplanted organ and tissue status: Secondary | ICD-10-CM | POA: Diagnosis not present

## 2017-06-09 DIAGNOSIS — R918 Other nonspecific abnormal finding of lung field: Secondary | ICD-10-CM | POA: Diagnosis not present

## 2017-06-09 DIAGNOSIS — R109 Unspecified abdominal pain: Secondary | ICD-10-CM | POA: Diagnosis not present

## 2017-06-09 DIAGNOSIS — Z87891 Personal history of nicotine dependence: Secondary | ICD-10-CM | POA: Diagnosis not present

## 2017-06-09 DIAGNOSIS — Z9889 Other specified postprocedural states: Secondary | ICD-10-CM | POA: Diagnosis not present

## 2017-06-09 DIAGNOSIS — E119 Type 2 diabetes mellitus without complications: Secondary | ICD-10-CM | POA: Diagnosis not present

## 2017-06-09 DIAGNOSIS — K5730 Diverticulosis of large intestine without perforation or abscess without bleeding: Secondary | ICD-10-CM | POA: Diagnosis not present

## 2017-06-09 DIAGNOSIS — I10 Essential (primary) hypertension: Secondary | ICD-10-CM | POA: Diagnosis not present

## 2017-06-09 DIAGNOSIS — R1031 Right lower quadrant pain: Secondary | ICD-10-CM | POA: Diagnosis not present

## 2017-06-09 DIAGNOSIS — R51 Headache: Secondary | ICD-10-CM | POA: Diagnosis not present

## 2017-06-09 DIAGNOSIS — Z792 Long term (current) use of antibiotics: Secondary | ICD-10-CM | POA: Diagnosis not present

## 2017-06-10 DIAGNOSIS — R112 Nausea with vomiting, unspecified: Secondary | ICD-10-CM | POA: Diagnosis not present

## 2017-06-10 DIAGNOSIS — R1031 Right lower quadrant pain: Secondary | ICD-10-CM | POA: Diagnosis not present

## 2017-06-10 DIAGNOSIS — D899 Disorder involving the immune mechanism, unspecified: Secondary | ICD-10-CM | POA: Diagnosis not present

## 2017-06-10 DIAGNOSIS — Z87442 Personal history of urinary calculi: Secondary | ICD-10-CM | POA: Diagnosis not present

## 2017-06-10 DIAGNOSIS — Z9482 Intestine transplant status: Secondary | ICD-10-CM | POA: Diagnosis not present

## 2017-06-10 DIAGNOSIS — R55 Syncope and collapse: Secondary | ICD-10-CM | POA: Diagnosis not present

## 2017-06-10 DIAGNOSIS — Z91041 Radiographic dye allergy status: Secondary | ICD-10-CM | POA: Diagnosis not present

## 2017-06-10 DIAGNOSIS — R109 Unspecified abdominal pain: Secondary | ICD-10-CM | POA: Diagnosis not present

## 2017-06-10 DIAGNOSIS — N200 Calculus of kidney: Secondary | ICD-10-CM | POA: Diagnosis not present

## 2017-06-10 DIAGNOSIS — R509 Fever, unspecified: Secondary | ICD-10-CM | POA: Diagnosis not present

## 2017-06-10 DIAGNOSIS — R918 Other nonspecific abnormal finding of lung field: Secondary | ICD-10-CM | POA: Diagnosis not present

## 2017-06-10 DIAGNOSIS — Z944 Liver transplant status: Secondary | ICD-10-CM | POA: Diagnosis not present

## 2017-06-11 DIAGNOSIS — R509 Fever, unspecified: Secondary | ICD-10-CM | POA: Diagnosis not present

## 2017-06-11 DIAGNOSIS — Z9482 Intestine transplant status: Secondary | ICD-10-CM | POA: Diagnosis not present

## 2017-06-11 DIAGNOSIS — D899 Disorder involving the immune mechanism, unspecified: Secondary | ICD-10-CM | POA: Diagnosis not present

## 2017-06-11 DIAGNOSIS — R188 Other ascites: Secondary | ICD-10-CM | POA: Diagnosis not present

## 2017-06-11 DIAGNOSIS — R1031 Right lower quadrant pain: Secondary | ICD-10-CM | POA: Diagnosis not present

## 2017-06-11 DIAGNOSIS — Z944 Liver transplant status: Secondary | ICD-10-CM | POA: Diagnosis not present

## 2017-06-12 DIAGNOSIS — Z9482 Intestine transplant status: Secondary | ICD-10-CM | POA: Diagnosis not present

## 2017-06-12 DIAGNOSIS — D899 Disorder involving the immune mechanism, unspecified: Secondary | ICD-10-CM | POA: Diagnosis not present

## 2017-06-12 DIAGNOSIS — R509 Fever, unspecified: Secondary | ICD-10-CM | POA: Diagnosis not present

## 2017-06-12 DIAGNOSIS — R197 Diarrhea, unspecified: Secondary | ICD-10-CM | POA: Diagnosis not present

## 2017-06-12 DIAGNOSIS — R1031 Right lower quadrant pain: Secondary | ICD-10-CM | POA: Diagnosis not present

## 2017-06-12 DIAGNOSIS — R918 Other nonspecific abnormal finding of lung field: Secondary | ICD-10-CM | POA: Diagnosis not present

## 2017-06-13 DIAGNOSIS — R1031 Right lower quadrant pain: Secondary | ICD-10-CM | POA: Diagnosis not present

## 2017-06-13 DIAGNOSIS — Z792 Long term (current) use of antibiotics: Secondary | ICD-10-CM | POA: Diagnosis not present

## 2017-06-13 DIAGNOSIS — Z9482 Intestine transplant status: Secondary | ICD-10-CM | POA: Diagnosis not present

## 2017-06-13 DIAGNOSIS — R509 Fever, unspecified: Secondary | ICD-10-CM | POA: Diagnosis not present

## 2017-06-13 DIAGNOSIS — D899 Disorder involving the immune mechanism, unspecified: Secondary | ICD-10-CM | POA: Diagnosis not present

## 2017-06-13 DIAGNOSIS — R918 Other nonspecific abnormal finding of lung field: Secondary | ICD-10-CM | POA: Diagnosis not present

## 2017-06-14 DIAGNOSIS — I10 Essential (primary) hypertension: Secondary | ICD-10-CM | POA: Diagnosis not present

## 2017-06-14 DIAGNOSIS — D899 Disorder involving the immune mechanism, unspecified: Secondary | ICD-10-CM | POA: Diagnosis not present

## 2017-06-14 DIAGNOSIS — R509 Fever, unspecified: Secondary | ICD-10-CM | POA: Diagnosis not present

## 2017-06-14 DIAGNOSIS — R197 Diarrhea, unspecified: Secondary | ICD-10-CM | POA: Diagnosis not present

## 2017-06-14 DIAGNOSIS — R55 Syncope and collapse: Secondary | ICD-10-CM | POA: Diagnosis not present

## 2017-06-14 DIAGNOSIS — R1031 Right lower quadrant pain: Secondary | ICD-10-CM | POA: Diagnosis not present

## 2017-06-14 DIAGNOSIS — Z9482 Intestine transplant status: Secondary | ICD-10-CM | POA: Diagnosis not present

## 2017-06-19 DIAGNOSIS — Z9885 Transplanted organ removal status: Secondary | ICD-10-CM | POA: Diagnosis not present

## 2017-06-29 DIAGNOSIS — R51 Headache: Secondary | ICD-10-CM | POA: Diagnosis not present

## 2017-06-29 DIAGNOSIS — G903 Multi-system degeneration of the autonomic nervous system: Secondary | ICD-10-CM | POA: Diagnosis not present

## 2017-06-29 DIAGNOSIS — T8690 Unspecified complication of unspecified transplanted organ and tissue: Secondary | ICD-10-CM | POA: Diagnosis not present

## 2017-06-29 DIAGNOSIS — B182 Chronic viral hepatitis C: Secondary | ICD-10-CM | POA: Diagnosis not present

## 2017-06-29 DIAGNOSIS — E1143 Type 2 diabetes mellitus with diabetic autonomic (poly)neuropathy: Secondary | ICD-10-CM | POA: Diagnosis not present

## 2017-06-29 DIAGNOSIS — D638 Anemia in other chronic diseases classified elsewhere: Secondary | ICD-10-CM | POA: Diagnosis not present

## 2017-06-29 DIAGNOSIS — N189 Chronic kidney disease, unspecified: Secondary | ICD-10-CM | POA: Diagnosis not present

## 2017-06-29 DIAGNOSIS — Z87442 Personal history of urinary calculi: Secondary | ICD-10-CM | POA: Diagnosis not present

## 2017-06-29 DIAGNOSIS — Z7682 Awaiting organ transplant status: Secondary | ICD-10-CM | POA: Diagnosis not present

## 2017-06-29 DIAGNOSIS — I10 Essential (primary) hypertension: Secondary | ICD-10-CM | POA: Diagnosis not present

## 2017-06-29 DIAGNOSIS — D899 Disorder involving the immune mechanism, unspecified: Secondary | ICD-10-CM | POA: Diagnosis not present

## 2017-06-29 DIAGNOSIS — E559 Vitamin D deficiency, unspecified: Secondary | ICD-10-CM | POA: Diagnosis not present

## 2017-06-29 DIAGNOSIS — I129 Hypertensive chronic kidney disease with stage 1 through stage 4 chronic kidney disease, or unspecified chronic kidney disease: Secondary | ICD-10-CM | POA: Diagnosis not present

## 2017-06-29 DIAGNOSIS — I951 Orthostatic hypotension: Secondary | ICD-10-CM | POA: Diagnosis not present

## 2017-06-29 DIAGNOSIS — E1122 Type 2 diabetes mellitus with diabetic chronic kidney disease: Secondary | ICD-10-CM | POA: Diagnosis not present

## 2017-06-29 DIAGNOSIS — M797 Fibromyalgia: Secondary | ICD-10-CM | POA: Diagnosis not present

## 2017-06-29 DIAGNOSIS — F418 Other specified anxiety disorders: Secondary | ICD-10-CM | POA: Diagnosis not present

## 2017-06-29 DIAGNOSIS — K7460 Unspecified cirrhosis of liver: Secondary | ICD-10-CM | POA: Diagnosis not present

## 2017-06-29 DIAGNOSIS — G8929 Other chronic pain: Secondary | ICD-10-CM | POA: Diagnosis not present

## 2017-06-29 DIAGNOSIS — Z9482 Intestine transplant status: Secondary | ICD-10-CM | POA: Diagnosis not present

## 2017-06-29 DIAGNOSIS — Z7952 Long term (current) use of systemic steroids: Secondary | ICD-10-CM | POA: Diagnosis not present

## 2017-06-29 DIAGNOSIS — Z944 Liver transplant status: Secondary | ICD-10-CM | POA: Diagnosis not present

## 2017-06-29 DIAGNOSIS — I159 Secondary hypertension, unspecified: Secondary | ICD-10-CM | POA: Diagnosis not present

## 2017-06-29 DIAGNOSIS — Z7982 Long term (current) use of aspirin: Secondary | ICD-10-CM | POA: Diagnosis not present

## 2017-06-29 DIAGNOSIS — R42 Dizziness and giddiness: Secondary | ICD-10-CM | POA: Diagnosis not present

## 2017-06-30 DIAGNOSIS — I951 Orthostatic hypotension: Secondary | ICD-10-CM | POA: Diagnosis not present

## 2017-06-30 DIAGNOSIS — I10 Essential (primary) hypertension: Secondary | ICD-10-CM | POA: Diagnosis not present

## 2017-06-30 DIAGNOSIS — T8690 Unspecified complication of unspecified transplanted organ and tissue: Secondary | ICD-10-CM | POA: Diagnosis not present

## 2017-06-30 DIAGNOSIS — G903 Multi-system degeneration of the autonomic nervous system: Secondary | ICD-10-CM | POA: Diagnosis not present

## 2017-06-30 DIAGNOSIS — D899 Disorder involving the immune mechanism, unspecified: Secondary | ICD-10-CM | POA: Diagnosis not present

## 2017-07-01 DIAGNOSIS — G903 Multi-system degeneration of the autonomic nervous system: Secondary | ICD-10-CM | POA: Diagnosis not present

## 2017-07-01 DIAGNOSIS — I159 Secondary hypertension, unspecified: Secondary | ICD-10-CM | POA: Diagnosis not present

## 2017-07-01 DIAGNOSIS — D899 Disorder involving the immune mechanism, unspecified: Secondary | ICD-10-CM | POA: Diagnosis not present

## 2017-07-01 DIAGNOSIS — I951 Orthostatic hypotension: Secondary | ICD-10-CM | POA: Diagnosis not present

## 2017-07-01 DIAGNOSIS — T8690 Unspecified complication of unspecified transplanted organ and tissue: Secondary | ICD-10-CM | POA: Diagnosis not present

## 2017-07-01 DIAGNOSIS — I10 Essential (primary) hypertension: Secondary | ICD-10-CM | POA: Diagnosis not present

## 2017-07-02 DIAGNOSIS — I159 Secondary hypertension, unspecified: Secondary | ICD-10-CM | POA: Diagnosis not present

## 2017-07-02 DIAGNOSIS — I951 Orthostatic hypotension: Secondary | ICD-10-CM | POA: Diagnosis not present

## 2017-07-02 DIAGNOSIS — I10 Essential (primary) hypertension: Secondary | ICD-10-CM | POA: Diagnosis not present

## 2017-07-02 DIAGNOSIS — G903 Multi-system degeneration of the autonomic nervous system: Secondary | ICD-10-CM | POA: Diagnosis not present

## 2017-07-02 DIAGNOSIS — T8690 Unspecified complication of unspecified transplanted organ and tissue: Secondary | ICD-10-CM | POA: Diagnosis not present

## 2017-07-02 DIAGNOSIS — D899 Disorder involving the immune mechanism, unspecified: Secondary | ICD-10-CM | POA: Diagnosis not present

## 2017-07-03 DIAGNOSIS — I159 Secondary hypertension, unspecified: Secondary | ICD-10-CM | POA: Diagnosis not present

## 2017-07-03 DIAGNOSIS — Z7682 Awaiting organ transplant status: Secondary | ICD-10-CM | POA: Diagnosis not present

## 2017-07-03 DIAGNOSIS — R51 Headache: Secondary | ICD-10-CM | POA: Diagnosis not present

## 2017-07-03 DIAGNOSIS — G903 Multi-system degeneration of the autonomic nervous system: Secondary | ICD-10-CM | POA: Diagnosis not present

## 2017-07-03 DIAGNOSIS — D899 Disorder involving the immune mechanism, unspecified: Secondary | ICD-10-CM | POA: Diagnosis not present

## 2017-07-03 DIAGNOSIS — I951 Orthostatic hypotension: Secondary | ICD-10-CM | POA: Diagnosis not present

## 2017-07-03 DIAGNOSIS — T8690 Unspecified complication of unspecified transplanted organ and tissue: Secondary | ICD-10-CM | POA: Diagnosis not present

## 2017-07-03 DIAGNOSIS — I10 Essential (primary) hypertension: Secondary | ICD-10-CM | POA: Diagnosis not present

## 2017-07-04 DIAGNOSIS — R42 Dizziness and giddiness: Secondary | ICD-10-CM | POA: Diagnosis not present

## 2017-07-04 DIAGNOSIS — R51 Headache: Secondary | ICD-10-CM | POA: Diagnosis not present

## 2017-07-04 DIAGNOSIS — D899 Disorder involving the immune mechanism, unspecified: Secondary | ICD-10-CM | POA: Diagnosis not present

## 2017-07-04 DIAGNOSIS — Z944 Liver transplant status: Secondary | ICD-10-CM | POA: Diagnosis not present

## 2017-07-04 DIAGNOSIS — I951 Orthostatic hypotension: Secondary | ICD-10-CM | POA: Diagnosis not present

## 2017-07-05 DIAGNOSIS — R51 Headache: Secondary | ICD-10-CM | POA: Diagnosis not present

## 2017-07-05 DIAGNOSIS — R42 Dizziness and giddiness: Secondary | ICD-10-CM | POA: Diagnosis not present

## 2017-07-05 DIAGNOSIS — D899 Disorder involving the immune mechanism, unspecified: Secondary | ICD-10-CM | POA: Diagnosis not present

## 2017-07-05 DIAGNOSIS — I159 Secondary hypertension, unspecified: Secondary | ICD-10-CM | POA: Diagnosis not present

## 2017-07-05 DIAGNOSIS — I10 Essential (primary) hypertension: Secondary | ICD-10-CM | POA: Diagnosis not present

## 2017-07-05 DIAGNOSIS — Z944 Liver transplant status: Secondary | ICD-10-CM | POA: Diagnosis not present

## 2017-07-06 DIAGNOSIS — I10 Essential (primary) hypertension: Secondary | ICD-10-CM | POA: Diagnosis not present

## 2017-07-06 DIAGNOSIS — Z944 Liver transplant status: Secondary | ICD-10-CM | POA: Diagnosis not present

## 2017-07-06 DIAGNOSIS — R42 Dizziness and giddiness: Secondary | ICD-10-CM | POA: Diagnosis not present

## 2017-07-06 DIAGNOSIS — R51 Headache: Secondary | ICD-10-CM | POA: Diagnosis not present

## 2017-07-06 DIAGNOSIS — D899 Disorder involving the immune mechanism, unspecified: Secondary | ICD-10-CM | POA: Diagnosis not present

## 2017-07-07 DIAGNOSIS — Z9885 Transplanted organ removal status: Secondary | ICD-10-CM | POA: Diagnosis not present

## 2017-07-20 DIAGNOSIS — I10 Essential (primary) hypertension: Secondary | ICD-10-CM | POA: Diagnosis not present

## 2017-07-20 DIAGNOSIS — R55 Syncope and collapse: Secondary | ICD-10-CM | POA: Diagnosis not present

## 2017-07-25 DIAGNOSIS — Z7682 Awaiting organ transplant status: Secondary | ICD-10-CM | POA: Diagnosis not present

## 2017-07-31 DIAGNOSIS — Z9885 Transplanted organ removal status: Secondary | ICD-10-CM | POA: Diagnosis not present

## 2017-08-08 DIAGNOSIS — Z7682 Awaiting organ transplant status: Secondary | ICD-10-CM | POA: Diagnosis not present

## 2017-08-11 DIAGNOSIS — Z7682 Awaiting organ transplant status: Secondary | ICD-10-CM | POA: Diagnosis not present

## 2017-08-14 DIAGNOSIS — Z949 Transplanted organ and tissue status, unspecified: Secondary | ICD-10-CM | POA: Diagnosis not present

## 2017-08-14 DIAGNOSIS — Z7682 Awaiting organ transplant status: Secondary | ICD-10-CM | POA: Diagnosis not present

## 2017-09-07 DIAGNOSIS — Z9885 Transplanted organ removal status: Secondary | ICD-10-CM | POA: Diagnosis not present

## 2017-09-22 DIAGNOSIS — Z7682 Awaiting organ transplant status: Secondary | ICD-10-CM | POA: Diagnosis not present

## 2017-09-25 ENCOUNTER — Other Ambulatory Visit: Payer: Self-pay | Admitting: Gastroenterology

## 2017-09-25 DIAGNOSIS — Z1231 Encounter for screening mammogram for malignant neoplasm of breast: Secondary | ICD-10-CM

## 2017-09-29 DIAGNOSIS — Z9483 Pancreas transplant status: Secondary | ICD-10-CM | POA: Diagnosis not present

## 2017-09-29 DIAGNOSIS — K7460 Unspecified cirrhosis of liver: Secondary | ICD-10-CM | POA: Diagnosis not present

## 2017-09-29 DIAGNOSIS — N189 Chronic kidney disease, unspecified: Secondary | ICD-10-CM | POA: Diagnosis not present

## 2017-09-29 DIAGNOSIS — Z9884 Bariatric surgery status: Secondary | ICD-10-CM | POA: Diagnosis not present

## 2017-09-29 DIAGNOSIS — K219 Gastro-esophageal reflux disease without esophagitis: Secondary | ICD-10-CM | POA: Diagnosis not present

## 2017-09-29 DIAGNOSIS — I129 Hypertensive chronic kidney disease with stage 1 through stage 4 chronic kidney disease, or unspecified chronic kidney disease: Secondary | ICD-10-CM | POA: Diagnosis not present

## 2017-09-29 DIAGNOSIS — Z9482 Intestine transplant status: Secondary | ICD-10-CM | POA: Diagnosis not present

## 2017-09-29 DIAGNOSIS — Z48815 Encounter for surgical aftercare following surgery on the digestive system: Secondary | ICD-10-CM | POA: Diagnosis not present

## 2017-09-29 DIAGNOSIS — Z944 Liver transplant status: Secondary | ICD-10-CM | POA: Diagnosis not present

## 2017-09-29 DIAGNOSIS — Z79899 Other long term (current) drug therapy: Secondary | ICD-10-CM | POA: Diagnosis not present

## 2017-09-29 DIAGNOSIS — Z9885 Transplanted organ removal status: Secondary | ICD-10-CM | POA: Diagnosis not present

## 2017-09-29 DIAGNOSIS — Z9889 Other specified postprocedural states: Secondary | ICD-10-CM | POA: Diagnosis not present

## 2017-09-29 DIAGNOSIS — B182 Chronic viral hepatitis C: Secondary | ICD-10-CM | POA: Diagnosis not present

## 2017-09-29 DIAGNOSIS — Z90710 Acquired absence of both cervix and uterus: Secondary | ICD-10-CM | POA: Diagnosis not present

## 2017-09-29 DIAGNOSIS — F419 Anxiety disorder, unspecified: Secondary | ICD-10-CM | POA: Diagnosis not present

## 2017-09-29 DIAGNOSIS — Z980 Intestinal bypass and anastomosis status: Secondary | ICD-10-CM | POA: Diagnosis not present

## 2017-09-29 DIAGNOSIS — R194 Change in bowel habit: Secondary | ICD-10-CM | POA: Diagnosis not present

## 2017-09-29 DIAGNOSIS — I10 Essential (primary) hypertension: Secondary | ICD-10-CM | POA: Diagnosis not present

## 2017-09-29 DIAGNOSIS — Z48298 Encounter for aftercare following other organ transplant: Secondary | ICD-10-CM | POA: Diagnosis not present

## 2017-09-29 DIAGNOSIS — Z9489 Other transplanted organ and tissue status: Secondary | ICD-10-CM | POA: Diagnosis not present

## 2017-10-10 DIAGNOSIS — Z0001 Encounter for general adult medical examination with abnormal findings: Secondary | ICD-10-CM | POA: Diagnosis not present

## 2017-10-10 DIAGNOSIS — R5383 Other fatigue: Secondary | ICD-10-CM | POA: Diagnosis not present

## 2017-10-10 DIAGNOSIS — Z6832 Body mass index (BMI) 32.0-32.9, adult: Secondary | ICD-10-CM | POA: Diagnosis not present

## 2017-10-10 DIAGNOSIS — M75101 Unspecified rotator cuff tear or rupture of right shoulder, not specified as traumatic: Secondary | ICD-10-CM | POA: Diagnosis not present

## 2017-10-11 DIAGNOSIS — Z0001 Encounter for general adult medical examination with abnormal findings: Secondary | ICD-10-CM | POA: Diagnosis not present

## 2017-10-11 DIAGNOSIS — R5383 Other fatigue: Secondary | ICD-10-CM | POA: Diagnosis not present

## 2017-10-11 DIAGNOSIS — Z6832 Body mass index (BMI) 32.0-32.9, adult: Secondary | ICD-10-CM | POA: Diagnosis not present

## 2017-10-11 DIAGNOSIS — E6609 Other obesity due to excess calories: Secondary | ICD-10-CM | POA: Diagnosis not present

## 2017-10-17 DIAGNOSIS — Z1159 Encounter for screening for other viral diseases: Secondary | ICD-10-CM | POA: Diagnosis not present

## 2017-10-17 DIAGNOSIS — Z7682 Awaiting organ transplant status: Secondary | ICD-10-CM | POA: Diagnosis not present

## 2017-10-19 ENCOUNTER — Ambulatory Visit: Payer: Medicare Other

## 2017-10-19 DIAGNOSIS — Z7682 Awaiting organ transplant status: Secondary | ICD-10-CM | POA: Diagnosis not present

## 2017-10-19 DIAGNOSIS — D899 Disorder involving the immune mechanism, unspecified: Secondary | ICD-10-CM | POA: Diagnosis not present

## 2017-10-20 DIAGNOSIS — Z9885 Transplanted organ removal status: Secondary | ICD-10-CM | POA: Diagnosis not present

## 2017-10-25 DIAGNOSIS — L570 Actinic keratosis: Secondary | ICD-10-CM | POA: Diagnosis not present

## 2017-10-25 DIAGNOSIS — D2270 Melanocytic nevi of unspecified lower limb, including hip: Secondary | ICD-10-CM | POA: Diagnosis not present

## 2017-10-25 DIAGNOSIS — D899 Disorder involving the immune mechanism, unspecified: Secondary | ICD-10-CM | POA: Diagnosis not present

## 2017-10-25 DIAGNOSIS — D485 Neoplasm of uncertain behavior of skin: Secondary | ICD-10-CM | POA: Diagnosis not present

## 2017-10-25 DIAGNOSIS — M25511 Pain in right shoulder: Secondary | ICD-10-CM | POA: Diagnosis not present

## 2017-10-30 DIAGNOSIS — K922 Gastrointestinal hemorrhage, unspecified: Secondary | ICD-10-CM | POA: Diagnosis not present

## 2017-10-30 DIAGNOSIS — B1920 Unspecified viral hepatitis C without hepatic coma: Secondary | ICD-10-CM | POA: Diagnosis not present

## 2017-10-30 DIAGNOSIS — I2510 Atherosclerotic heart disease of native coronary artery without angina pectoris: Secondary | ICD-10-CM | POA: Diagnosis not present

## 2017-10-30 DIAGNOSIS — K7460 Unspecified cirrhosis of liver: Secondary | ICD-10-CM | POA: Diagnosis not present

## 2017-10-30 DIAGNOSIS — Z7682 Awaiting organ transplant status: Secondary | ICD-10-CM | POA: Diagnosis not present

## 2017-10-30 DIAGNOSIS — R918 Other nonspecific abnormal finding of lung field: Secondary | ICD-10-CM | POA: Diagnosis not present

## 2017-10-30 DIAGNOSIS — R9389 Abnormal findings on diagnostic imaging of other specified body structures: Secondary | ICD-10-CM | POA: Diagnosis not present

## 2017-10-30 DIAGNOSIS — Z8619 Personal history of other infectious and parasitic diseases: Secondary | ICD-10-CM | POA: Diagnosis not present

## 2017-10-30 DIAGNOSIS — R188 Other ascites: Secondary | ICD-10-CM | POA: Diagnosis not present

## 2017-10-30 DIAGNOSIS — Z9884 Bariatric surgery status: Secondary | ICD-10-CM | POA: Diagnosis not present

## 2017-11-01 DIAGNOSIS — I890 Lymphedema, not elsewhere classified: Secondary | ICD-10-CM | POA: Diagnosis not present

## 2017-11-01 DIAGNOSIS — H11441 Conjunctival cysts, right eye: Secondary | ICD-10-CM | POA: Diagnosis not present

## 2017-11-08 DIAGNOSIS — D045 Carcinoma in situ of skin of trunk: Secondary | ICD-10-CM | POA: Diagnosis not present

## 2017-11-08 DIAGNOSIS — C44712 Basal cell carcinoma of skin of right lower limb, including hip: Secondary | ICD-10-CM | POA: Diagnosis not present

## 2017-11-08 DIAGNOSIS — D485 Neoplasm of uncertain behavior of skin: Secondary | ICD-10-CM | POA: Diagnosis not present

## 2017-11-10 DIAGNOSIS — C4442 Squamous cell carcinoma of skin of scalp and neck: Secondary | ICD-10-CM | POA: Diagnosis not present

## 2017-11-15 DIAGNOSIS — Z9482 Intestine transplant status: Secondary | ICD-10-CM | POA: Diagnosis not present

## 2017-11-23 DIAGNOSIS — M7541 Impingement syndrome of right shoulder: Secondary | ICD-10-CM | POA: Diagnosis not present

## 2017-11-23 DIAGNOSIS — M7542 Impingement syndrome of left shoulder: Secondary | ICD-10-CM | POA: Diagnosis not present

## 2017-11-23 DIAGNOSIS — M7501 Adhesive capsulitis of right shoulder: Secondary | ICD-10-CM | POA: Diagnosis not present

## 2017-11-23 DIAGNOSIS — M7502 Adhesive capsulitis of left shoulder: Secondary | ICD-10-CM | POA: Diagnosis not present

## 2017-11-24 DIAGNOSIS — Z23 Encounter for immunization: Secondary | ICD-10-CM | POA: Diagnosis not present

## 2017-11-25 DIAGNOSIS — Z9885 Transplanted organ removal status: Secondary | ICD-10-CM | POA: Diagnosis not present

## 2017-12-04 DIAGNOSIS — Z7682 Awaiting organ transplant status: Secondary | ICD-10-CM | POA: Diagnosis not present

## 2017-12-08 DIAGNOSIS — Z7682 Awaiting organ transplant status: Secondary | ICD-10-CM | POA: Diagnosis not present

## 2017-12-14 DIAGNOSIS — R509 Fever, unspecified: Secondary | ICD-10-CM | POA: Diagnosis not present

## 2017-12-14 DIAGNOSIS — R61 Generalized hyperhidrosis: Secondary | ICD-10-CM | POA: Diagnosis not present

## 2017-12-14 DIAGNOSIS — R002 Palpitations: Secondary | ICD-10-CM | POA: Diagnosis not present

## 2017-12-14 DIAGNOSIS — K529 Noninfective gastroenteritis and colitis, unspecified: Secondary | ICD-10-CM | POA: Diagnosis not present

## 2017-12-14 DIAGNOSIS — E271 Primary adrenocortical insufficiency: Secondary | ICD-10-CM | POA: Diagnosis not present

## 2017-12-14 DIAGNOSIS — Z681 Body mass index (BMI) 19 or less, adult: Secondary | ICD-10-CM | POA: Diagnosis not present

## 2017-12-19 ENCOUNTER — Other Ambulatory Visit: Payer: Self-pay | Admitting: Internal Medicine

## 2017-12-19 DIAGNOSIS — Z1231 Encounter for screening mammogram for malignant neoplasm of breast: Secondary | ICD-10-CM

## 2017-12-20 ENCOUNTER — Other Ambulatory Visit: Payer: Self-pay | Admitting: Internal Medicine

## 2017-12-20 DIAGNOSIS — G4452 New daily persistent headache (NDPH): Secondary | ICD-10-CM

## 2018-01-01 DIAGNOSIS — Z944 Liver transplant status: Secondary | ICD-10-CM | POA: Diagnosis not present

## 2018-01-01 DIAGNOSIS — Z1159 Encounter for screening for other viral diseases: Secondary | ICD-10-CM | POA: Diagnosis not present

## 2018-01-01 DIAGNOSIS — Z9482 Intestine transplant status: Secondary | ICD-10-CM | POA: Diagnosis not present

## 2018-01-01 DIAGNOSIS — C801 Malignant (primary) neoplasm, unspecified: Secondary | ICD-10-CM | POA: Diagnosis not present

## 2018-01-01 DIAGNOSIS — Z9483 Pancreas transplant status: Secondary | ICD-10-CM | POA: Diagnosis not present

## 2018-01-01 DIAGNOSIS — R002 Palpitations: Secondary | ICD-10-CM | POA: Diagnosis not present

## 2018-01-01 DIAGNOSIS — R51 Headache: Secondary | ICD-10-CM | POA: Diagnosis not present

## 2018-01-01 DIAGNOSIS — Z48288 Encounter for aftercare following multiple organ transplant: Secondary | ICD-10-CM | POA: Diagnosis not present

## 2018-01-01 DIAGNOSIS — R195 Other fecal abnormalities: Secondary | ICD-10-CM | POA: Diagnosis not present

## 2018-01-01 DIAGNOSIS — Z7682 Awaiting organ transplant status: Secondary | ICD-10-CM | POA: Diagnosis not present

## 2018-01-29 ENCOUNTER — Ambulatory Visit
Admission: RE | Admit: 2018-01-29 | Discharge: 2018-01-29 | Disposition: A | Discharge status: Home / Self Care | Payer: Medicare Other | Source: Ambulatory Visit | Attending: Internal Medicine | Admitting: Internal Medicine

## 2018-01-29 DIAGNOSIS — Z1231 Encounter for screening mammogram for malignant neoplasm of breast: Secondary | ICD-10-CM | POA: Diagnosis not present

## 2018-01-29 DIAGNOSIS — G4452 New daily persistent headache (NDPH): Secondary | ICD-10-CM

## 2018-02-01 DIAGNOSIS — R002 Palpitations: Secondary | ICD-10-CM | POA: Diagnosis not present

## 2018-02-08 DIAGNOSIS — Z7682 Awaiting organ transplant status: Secondary | ICD-10-CM | POA: Diagnosis not present

## 2018-02-08 DIAGNOSIS — C419 Malignant neoplasm of bone and articular cartilage, unspecified: Secondary | ICD-10-CM | POA: Diagnosis not present

## 2018-02-08 DIAGNOSIS — Z9889 Other specified postprocedural states: Secondary | ICD-10-CM | POA: Diagnosis not present

## 2018-02-14 DIAGNOSIS — R002 Palpitations: Secondary | ICD-10-CM | POA: Diagnosis not present

## 2018-02-14 DIAGNOSIS — Z7682 Awaiting organ transplant status: Secondary | ICD-10-CM | POA: Diagnosis not present

## 2018-02-17 ENCOUNTER — Ambulatory Visit
Admission: RE | Admit: 2018-02-17 | Discharge: 2018-02-17 | Disposition: A | Discharge status: Home / Self Care | Payer: Medicare Other | Source: Ambulatory Visit | Attending: Internal Medicine | Admitting: Internal Medicine

## 2018-02-17 DIAGNOSIS — J320 Chronic maxillary sinusitis: Secondary | ICD-10-CM | POA: Diagnosis not present

## 2018-02-17 MED ORDER — GADOBENATE DIMEGLUMINE 529 MG/ML IV SOLN
9.0000 mL | Freq: Once | INTRAVENOUS | Status: AC | PRN
  Administered 2018-02-17: 9 mL via INTRAVENOUS

## 2018-03-19 DIAGNOSIS — D899 Disorder involving the immune mechanism, unspecified: Secondary | ICD-10-CM | POA: Diagnosis not present

## 2018-03-19 DIAGNOSIS — Z7682 Awaiting organ transplant status: Secondary | ICD-10-CM | POA: Diagnosis not present

## 2018-04-20 DIAGNOSIS — M7501 Adhesive capsulitis of right shoulder: Secondary | ICD-10-CM | POA: Diagnosis not present

## 2018-04-20 DIAGNOSIS — M7502 Adhesive capsulitis of left shoulder: Secondary | ICD-10-CM | POA: Diagnosis not present

## 2018-04-23 DIAGNOSIS — R55 Syncope and collapse: Secondary | ICD-10-CM | POA: Diagnosis not present

## 2018-04-23 DIAGNOSIS — G894 Chronic pain syndrome: Secondary | ICD-10-CM | POA: Diagnosis not present

## 2018-04-23 DIAGNOSIS — Z7682 Awaiting organ transplant status: Secondary | ICD-10-CM | POA: Diagnosis not present

## 2018-04-23 DIAGNOSIS — Z1389 Encounter for screening for other disorder: Secondary | ICD-10-CM | POA: Diagnosis not present

## 2018-04-23 DIAGNOSIS — Z681 Body mass index (BMI) 19 or less, adult: Secondary | ICD-10-CM | POA: Diagnosis not present

## 2018-04-26 DIAGNOSIS — D899 Disorder involving the immune mechanism, unspecified: Secondary | ICD-10-CM | POA: Diagnosis not present

## 2018-04-26 DIAGNOSIS — Z1159 Encounter for screening for other viral diseases: Secondary | ICD-10-CM | POA: Diagnosis not present

## 2018-04-26 DIAGNOSIS — Z7682 Awaiting organ transplant status: Secondary | ICD-10-CM | POA: Diagnosis not present

## 2018-05-24 DIAGNOSIS — Z6833 Body mass index (BMI) 33.0-33.9, adult: Secondary | ICD-10-CM | POA: Diagnosis not present

## 2018-05-24 DIAGNOSIS — R5382 Chronic fatigue, unspecified: Secondary | ICD-10-CM | POA: Diagnosis not present

## 2018-05-24 DIAGNOSIS — E6609 Other obesity due to excess calories: Secondary | ICD-10-CM | POA: Diagnosis not present

## 2018-05-24 DIAGNOSIS — M797 Fibromyalgia: Secondary | ICD-10-CM | POA: Diagnosis not present

## 2018-06-21 DIAGNOSIS — F329 Major depressive disorder, single episode, unspecified: Secondary | ICD-10-CM | POA: Diagnosis not present

## 2018-06-21 DIAGNOSIS — M797 Fibromyalgia: Secondary | ICD-10-CM | POA: Diagnosis not present

## 2018-06-21 DIAGNOSIS — G894 Chronic pain syndrome: Secondary | ICD-10-CM | POA: Diagnosis not present

## 2018-06-26 DIAGNOSIS — Z944 Liver transplant status: Secondary | ICD-10-CM | POA: Diagnosis not present

## 2018-06-26 DIAGNOSIS — S0990XA Unspecified injury of head, initial encounter: Secondary | ICD-10-CM | POA: Diagnosis not present

## 2018-06-26 DIAGNOSIS — Z9884 Bariatric surgery status: Secondary | ICD-10-CM | POA: Diagnosis not present

## 2018-06-26 DIAGNOSIS — R110 Nausea: Secondary | ICD-10-CM | POA: Diagnosis not present

## 2018-06-26 DIAGNOSIS — Z7952 Long term (current) use of systemic steroids: Secondary | ICD-10-CM | POA: Diagnosis not present

## 2018-06-26 DIAGNOSIS — R188 Other ascites: Secondary | ICD-10-CM | POA: Diagnosis not present

## 2018-06-26 DIAGNOSIS — K644 Residual hemorrhoidal skin tags: Secondary | ICD-10-CM | POA: Diagnosis not present

## 2018-06-26 DIAGNOSIS — E119 Type 2 diabetes mellitus without complications: Secondary | ICD-10-CM | POA: Diagnosis not present

## 2018-06-26 DIAGNOSIS — Z903 Acquired absence of stomach [part of]: Secondary | ICD-10-CM | POA: Diagnosis not present

## 2018-06-26 DIAGNOSIS — R918 Other nonspecific abnormal finding of lung field: Secondary | ICD-10-CM | POA: Diagnosis not present

## 2018-06-26 DIAGNOSIS — R55 Syncope and collapse: Secondary | ICD-10-CM | POA: Diagnosis not present

## 2018-06-26 DIAGNOSIS — F418 Other specified anxiety disorders: Secondary | ICD-10-CM | POA: Diagnosis not present

## 2018-06-26 DIAGNOSIS — Z87442 Personal history of urinary calculi: Secondary | ICD-10-CM | POA: Diagnosis not present

## 2018-06-26 DIAGNOSIS — Z9482 Intestine transplant status: Secondary | ICD-10-CM | POA: Diagnosis not present

## 2018-06-26 DIAGNOSIS — Y33XXXA Other specified events, undetermined intent, initial encounter: Secondary | ICD-10-CM | POA: Diagnosis not present

## 2018-06-26 DIAGNOSIS — R112 Nausea with vomiting, unspecified: Secondary | ICD-10-CM | POA: Diagnosis not present

## 2018-06-26 DIAGNOSIS — K649 Unspecified hemorrhoids: Secondary | ICD-10-CM | POA: Diagnosis not present

## 2018-06-26 DIAGNOSIS — R531 Weakness: Secondary | ICD-10-CM | POA: Diagnosis not present

## 2018-06-26 DIAGNOSIS — Z9489 Other transplanted organ and tissue status: Secondary | ICD-10-CM | POA: Diagnosis not present

## 2018-06-26 DIAGNOSIS — N390 Urinary tract infection, site not specified: Secondary | ICD-10-CM | POA: Diagnosis not present

## 2018-06-26 DIAGNOSIS — N179 Acute kidney failure, unspecified: Secondary | ICD-10-CM | POA: Diagnosis not present

## 2018-06-26 DIAGNOSIS — D899 Disorder involving the immune mechanism, unspecified: Secondary | ICD-10-CM | POA: Diagnosis not present

## 2018-06-26 DIAGNOSIS — Z87891 Personal history of nicotine dependence: Secondary | ICD-10-CM | POA: Diagnosis not present

## 2018-06-26 DIAGNOSIS — R51 Headache: Secondary | ICD-10-CM | POA: Diagnosis not present

## 2018-06-26 DIAGNOSIS — K5730 Diverticulosis of large intestine without perforation or abscess without bleeding: Secondary | ICD-10-CM | POA: Diagnosis not present

## 2018-06-26 DIAGNOSIS — E1122 Type 2 diabetes mellitus with diabetic chronic kidney disease: Secondary | ICD-10-CM | POA: Diagnosis not present

## 2018-06-26 DIAGNOSIS — Z980 Intestinal bypass and anastomosis status: Secondary | ICD-10-CM | POA: Diagnosis not present

## 2018-06-26 DIAGNOSIS — G8929 Other chronic pain: Secondary | ICD-10-CM | POA: Diagnosis not present

## 2018-06-26 DIAGNOSIS — R109 Unspecified abdominal pain: Secondary | ICD-10-CM | POA: Diagnosis not present

## 2018-06-26 DIAGNOSIS — Z9483 Pancreas transplant status: Secondary | ICD-10-CM | POA: Diagnosis not present

## 2018-06-26 DIAGNOSIS — Z9049 Acquired absence of other specified parts of digestive tract: Secondary | ICD-10-CM | POA: Diagnosis not present

## 2018-06-26 DIAGNOSIS — K219 Gastro-esophageal reflux disease without esophagitis: Secondary | ICD-10-CM | POA: Diagnosis not present

## 2018-06-26 DIAGNOSIS — R197 Diarrhea, unspecified: Secondary | ICD-10-CM | POA: Diagnosis not present

## 2018-06-26 DIAGNOSIS — I951 Orthostatic hypotension: Secondary | ICD-10-CM | POA: Diagnosis not present

## 2018-07-10 DIAGNOSIS — Z9885 Transplanted organ removal status: Secondary | ICD-10-CM | POA: Diagnosis not present

## 2018-07-10 DIAGNOSIS — I10 Essential (primary) hypertension: Secondary | ICD-10-CM | POA: Diagnosis not present

## 2018-07-10 DIAGNOSIS — I951 Orthostatic hypotension: Secondary | ICD-10-CM | POA: Diagnosis not present

## 2018-07-20 DIAGNOSIS — Z9482 Intestine transplant status: Secondary | ICD-10-CM | POA: Diagnosis not present

## 2018-07-20 DIAGNOSIS — Z944 Liver transplant status: Secondary | ICD-10-CM | POA: Diagnosis not present

## 2018-08-02 DIAGNOSIS — G894 Chronic pain syndrome: Secondary | ICD-10-CM | POA: Diagnosis not present

## 2018-08-02 DIAGNOSIS — R351 Nocturia: Secondary | ICD-10-CM | POA: Diagnosis not present

## 2018-08-02 DIAGNOSIS — I951 Orthostatic hypotension: Secondary | ICD-10-CM | POA: Diagnosis not present

## 2018-08-03 DIAGNOSIS — R358 Other polyuria: Secondary | ICD-10-CM | POA: Diagnosis not present

## 2018-08-03 DIAGNOSIS — R5383 Other fatigue: Secondary | ICD-10-CM | POA: Diagnosis not present

## 2018-08-03 DIAGNOSIS — Z7682 Awaiting organ transplant status: Secondary | ICD-10-CM | POA: Diagnosis not present

## 2018-08-29 DIAGNOSIS — D1801 Hemangioma of skin and subcutaneous tissue: Secondary | ICD-10-CM | POA: Diagnosis not present

## 2018-08-29 DIAGNOSIS — I129 Hypertensive chronic kidney disease with stage 1 through stage 4 chronic kidney disease, or unspecified chronic kidney disease: Secondary | ICD-10-CM | POA: Diagnosis not present

## 2018-08-29 DIAGNOSIS — L570 Actinic keratosis: Secondary | ICD-10-CM | POA: Diagnosis not present

## 2018-08-29 DIAGNOSIS — D485 Neoplasm of uncertain behavior of skin: Secondary | ICD-10-CM | POA: Diagnosis not present

## 2018-08-29 DIAGNOSIS — L821 Other seborrheic keratosis: Secondary | ICD-10-CM | POA: Diagnosis not present

## 2018-08-29 DIAGNOSIS — D2260 Melanocytic nevi of unspecified upper limb, including shoulder: Secondary | ICD-10-CM | POA: Diagnosis not present

## 2018-08-29 DIAGNOSIS — D899 Disorder involving the immune mechanism, unspecified: Secondary | ICD-10-CM | POA: Diagnosis not present

## 2018-08-29 DIAGNOSIS — Z85828 Personal history of other malignant neoplasm of skin: Secondary | ICD-10-CM | POA: Diagnosis not present

## 2018-08-29 DIAGNOSIS — D2270 Melanocytic nevi of unspecified lower limb, including hip: Secondary | ICD-10-CM | POA: Diagnosis not present

## 2018-08-29 DIAGNOSIS — D225 Melanocytic nevi of trunk: Secondary | ICD-10-CM | POA: Diagnosis not present

## 2018-08-29 DIAGNOSIS — L814 Other melanin hyperpigmentation: Secondary | ICD-10-CM | POA: Diagnosis not present

## 2018-08-29 DIAGNOSIS — D631 Anemia in chronic kidney disease: Secondary | ICD-10-CM | POA: Diagnosis not present

## 2018-08-29 DIAGNOSIS — N183 Chronic kidney disease, stage 3 (moderate): Secondary | ICD-10-CM | POA: Diagnosis not present

## 2018-08-29 DIAGNOSIS — N189 Chronic kidney disease, unspecified: Secondary | ICD-10-CM | POA: Diagnosis not present

## 2018-08-29 DIAGNOSIS — D1030 Benign neoplasm of unspecified part of mouth: Secondary | ICD-10-CM | POA: Diagnosis not present

## 2018-08-29 DIAGNOSIS — Z1283 Encounter for screening for malignant neoplasm of skin: Secondary | ICD-10-CM | POA: Diagnosis not present

## 2018-08-29 DIAGNOSIS — E1122 Type 2 diabetes mellitus with diabetic chronic kidney disease: Secondary | ICD-10-CM | POA: Diagnosis not present

## 2018-08-30 DIAGNOSIS — Z6834 Body mass index (BMI) 34.0-34.9, adult: Secondary | ICD-10-CM | POA: Diagnosis not present

## 2018-08-30 DIAGNOSIS — G894 Chronic pain syndrome: Secondary | ICD-10-CM | POA: Diagnosis not present

## 2018-08-30 DIAGNOSIS — M797 Fibromyalgia: Secondary | ICD-10-CM | POA: Diagnosis not present

## 2018-08-30 DIAGNOSIS — I10 Essential (primary) hypertension: Secondary | ICD-10-CM | POA: Diagnosis not present

## 2018-10-10 DIAGNOSIS — D045 Carcinoma in situ of skin of trunk: Secondary | ICD-10-CM | POA: Diagnosis not present

## 2018-10-10 DIAGNOSIS — D1030 Benign neoplasm of unspecified part of mouth: Secondary | ICD-10-CM | POA: Diagnosis not present

## 2018-10-10 DIAGNOSIS — C44722 Squamous cell carcinoma of skin of right lower limb, including hip: Secondary | ICD-10-CM | POA: Diagnosis not present

## 2018-10-10 DIAGNOSIS — D485 Neoplasm of uncertain behavior of skin: Secondary | ICD-10-CM | POA: Diagnosis not present

## 2018-10-10 DIAGNOSIS — D0471 Carcinoma in situ of skin of right lower limb, including hip: Secondary | ICD-10-CM | POA: Diagnosis not present

## 2018-10-24 DIAGNOSIS — C44622 Squamous cell carcinoma of skin of right upper limb, including shoulder: Secondary | ICD-10-CM | POA: Diagnosis not present

## 2018-10-29 DIAGNOSIS — Z9885 Transplanted organ removal status: Secondary | ICD-10-CM | POA: Diagnosis not present

## 2018-10-29 DIAGNOSIS — R188 Other ascites: Secondary | ICD-10-CM | POA: Diagnosis not present

## 2018-10-29 DIAGNOSIS — Z944 Liver transplant status: Secondary | ICD-10-CM | POA: Diagnosis not present

## 2018-10-29 DIAGNOSIS — C419 Malignant neoplasm of bone and articular cartilage, unspecified: Secondary | ICD-10-CM | POA: Diagnosis not present

## 2018-10-29 DIAGNOSIS — Z9482 Intestine transplant status: Secondary | ICD-10-CM | POA: Diagnosis not present

## 2018-10-29 DIAGNOSIS — Z85828 Personal history of other malignant neoplasm of skin: Secondary | ICD-10-CM | POA: Diagnosis not present

## 2018-10-29 DIAGNOSIS — D1030 Benign neoplasm of unspecified part of mouth: Secondary | ICD-10-CM | POA: Diagnosis not present

## 2018-10-29 DIAGNOSIS — Z9483 Pancreas transplant status: Secondary | ICD-10-CM | POA: Diagnosis not present

## 2018-10-29 DIAGNOSIS — Z9489 Other transplanted organ and tissue status: Secondary | ICD-10-CM | POA: Diagnosis not present

## 2018-10-29 DIAGNOSIS — Z720 Tobacco use: Secondary | ICD-10-CM | POA: Diagnosis not present

## 2018-10-30 DIAGNOSIS — E119 Type 2 diabetes mellitus without complications: Secondary | ICD-10-CM | POA: Diagnosis not present

## 2018-10-30 DIAGNOSIS — F329 Major depressive disorder, single episode, unspecified: Secondary | ICD-10-CM | POA: Diagnosis not present

## 2018-10-30 DIAGNOSIS — Z0001 Encounter for general adult medical examination with abnormal findings: Secondary | ICD-10-CM | POA: Diagnosis not present

## 2018-10-30 DIAGNOSIS — F419 Anxiety disorder, unspecified: Secondary | ICD-10-CM | POA: Diagnosis not present

## 2018-10-31 DIAGNOSIS — E559 Vitamin D deficiency, unspecified: Secondary | ICD-10-CM | POA: Diagnosis not present

## 2018-10-31 DIAGNOSIS — Z7682 Awaiting organ transplant status: Secondary | ICD-10-CM | POA: Diagnosis not present

## 2018-10-31 DIAGNOSIS — E119 Type 2 diabetes mellitus without complications: Secondary | ICD-10-CM | POA: Diagnosis not present

## 2018-11-05 DIAGNOSIS — I951 Orthostatic hypotension: Secondary | ICD-10-CM | POA: Diagnosis not present

## 2018-11-05 DIAGNOSIS — Z48288 Encounter for aftercare following multiple organ transplant: Secondary | ICD-10-CM | POA: Diagnosis not present

## 2018-11-05 DIAGNOSIS — N183 Chronic kidney disease, stage 3 (moderate): Secondary | ICD-10-CM | POA: Diagnosis not present

## 2018-11-05 DIAGNOSIS — Z944 Liver transplant status: Secondary | ICD-10-CM | POA: Diagnosis not present

## 2018-11-05 DIAGNOSIS — M797 Fibromyalgia: Secondary | ICD-10-CM | POA: Diagnosis not present

## 2018-11-05 DIAGNOSIS — E559 Vitamin D deficiency, unspecified: Secondary | ICD-10-CM | POA: Diagnosis not present

## 2018-11-05 DIAGNOSIS — D899 Disorder involving the immune mechanism, unspecified: Secondary | ICD-10-CM | POA: Diagnosis not present

## 2018-11-05 DIAGNOSIS — R5383 Other fatigue: Secondary | ICD-10-CM | POA: Diagnosis not present

## 2018-11-05 DIAGNOSIS — Z85828 Personal history of other malignant neoplasm of skin: Secondary | ICD-10-CM | POA: Diagnosis not present

## 2018-11-05 DIAGNOSIS — R51 Headache: Secondary | ICD-10-CM | POA: Diagnosis not present

## 2018-11-05 DIAGNOSIS — Z9482 Intestine transplant status: Secondary | ICD-10-CM | POA: Diagnosis not present

## 2018-12-21 DIAGNOSIS — Z7682 Awaiting organ transplant status: Secondary | ICD-10-CM | POA: Diagnosis not present

## 2018-12-21 DIAGNOSIS — E559 Vitamin D deficiency, unspecified: Secondary | ICD-10-CM | POA: Diagnosis not present

## 2019-01-15 IMAGING — DX DG CHEST 2V
2 series · 2 of 2 positions shown · non-contrast
Comparison: 01/03/2016 chest radiograph.

CLINICAL DATA: Weakness, cirrhosis

EXAM:
CHEST  2 VIEW

[chest lat]
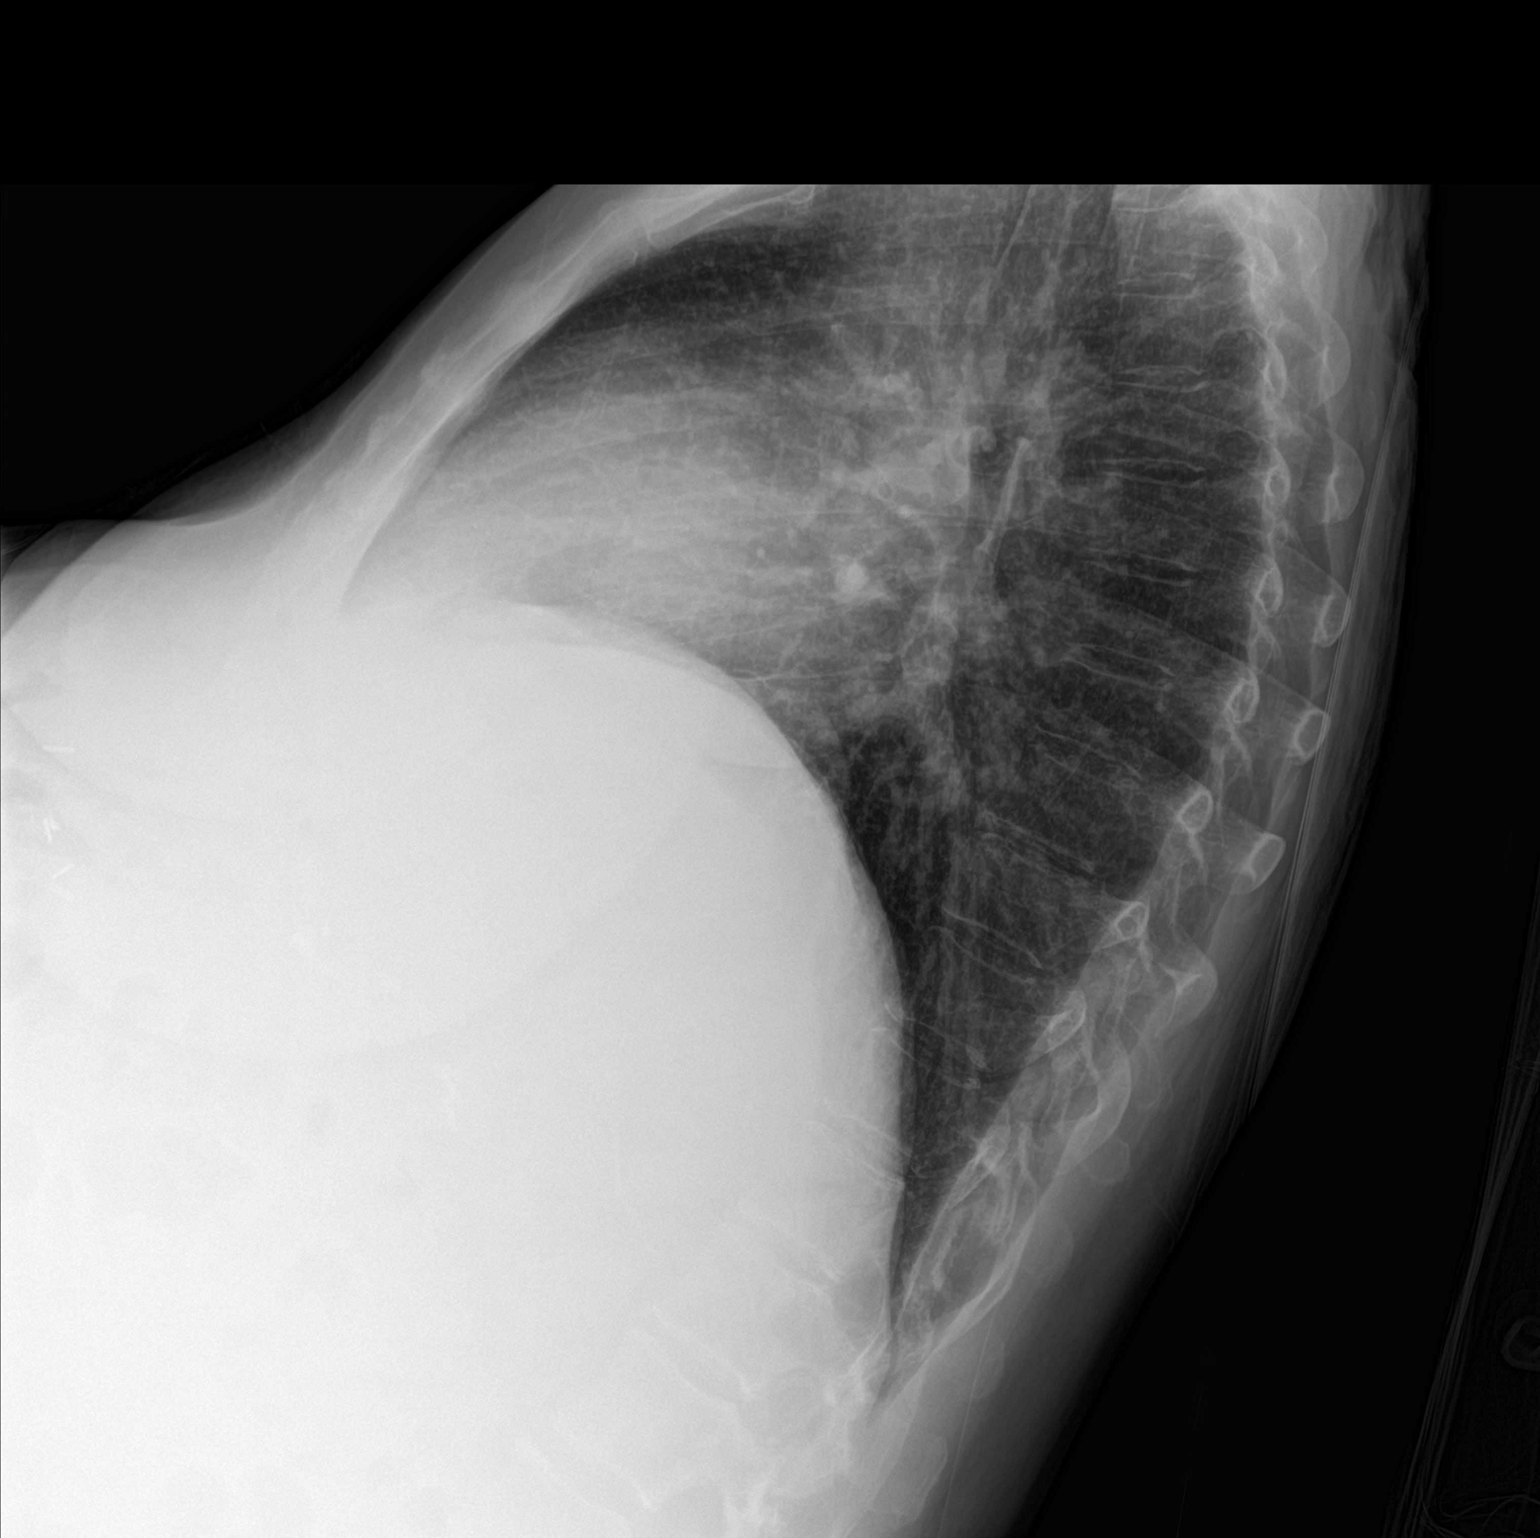

[chest ap]
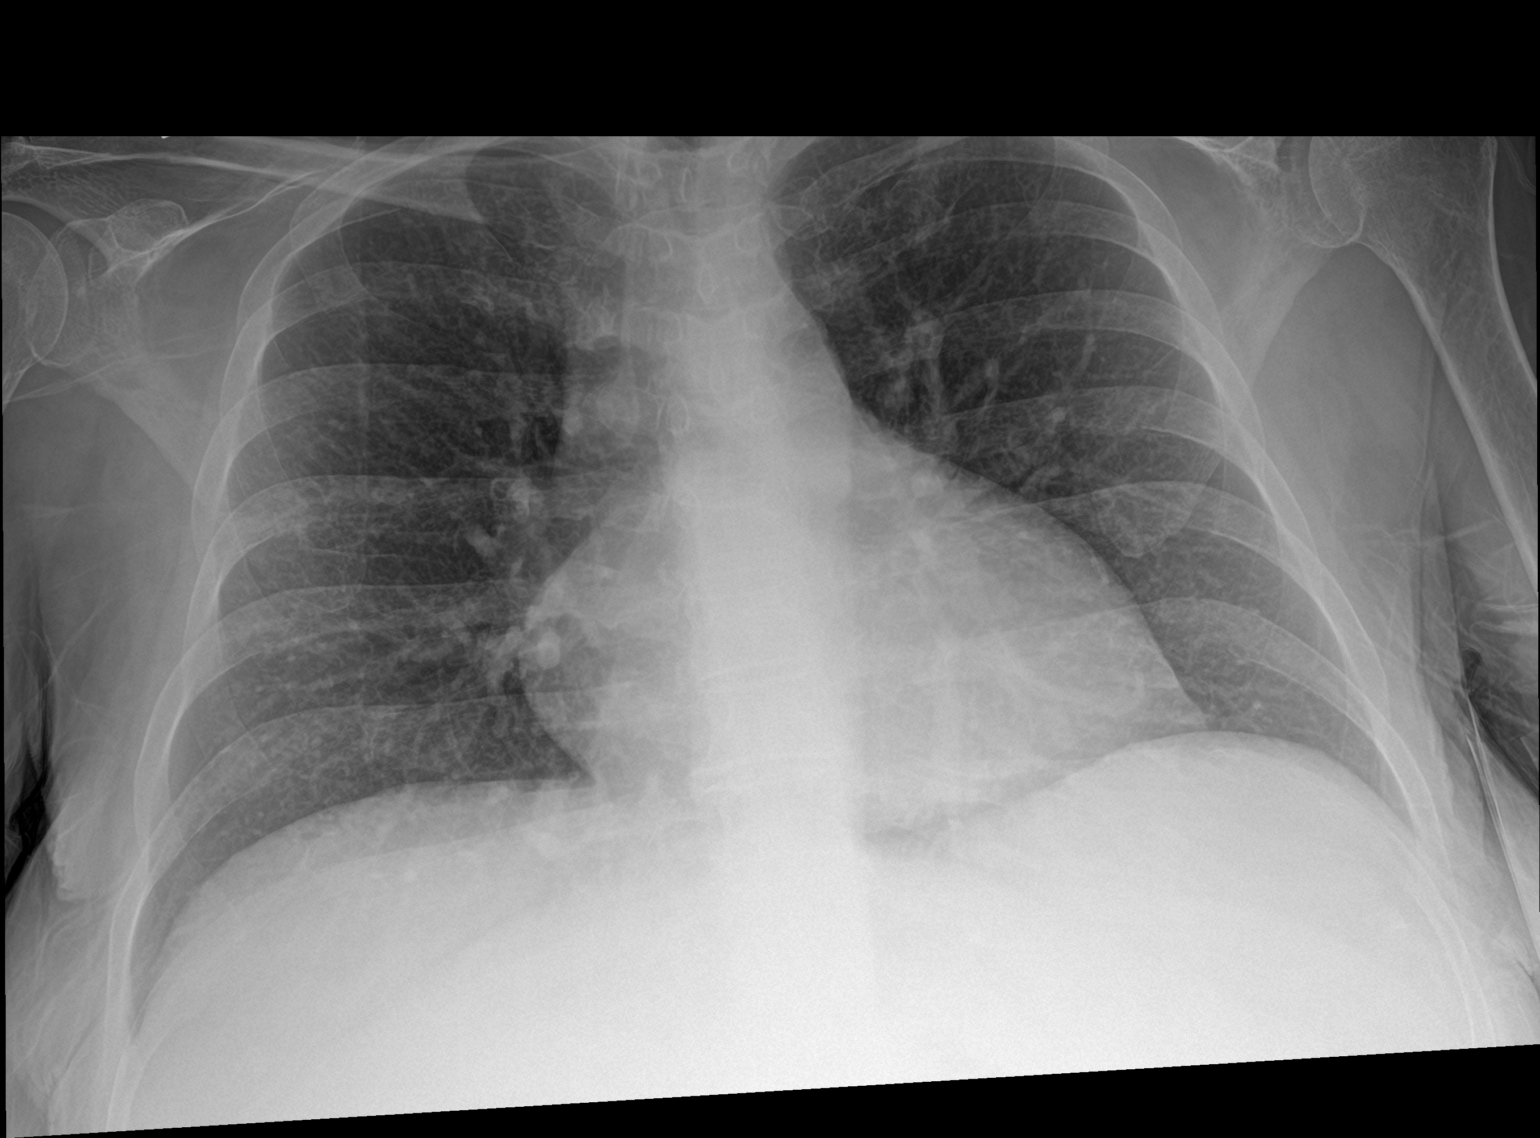

[2 of 2 positions shown; findings below may reference images not displayed]

FINDINGS: Low lung volumes. Stable cardiomediastinal silhouette with
top-normal heart size. No pneumothorax. No pleural effusion. Lungs
appear clear, with no acute consolidative airspace disease and no
pulmonary edema.
IMPRESSION: No active cardiopulmonary disease.

## 2019-02-26 DIAGNOSIS — Z7682 Awaiting organ transplant status: Secondary | ICD-10-CM | POA: Diagnosis not present

## 2019-02-26 DIAGNOSIS — E559 Vitamin D deficiency, unspecified: Secondary | ICD-10-CM | POA: Diagnosis not present

## 2019-02-26 DIAGNOSIS — M353 Polymyalgia rheumatica: Secondary | ICD-10-CM | POA: Diagnosis not present

## 2019-02-26 DIAGNOSIS — E669 Obesity, unspecified: Secondary | ICD-10-CM | POA: Diagnosis not present

## 2019-02-26 DIAGNOSIS — Z6834 Body mass index (BMI) 34.0-34.9, adult: Secondary | ICD-10-CM | POA: Diagnosis not present

## 2019-02-26 DIAGNOSIS — K7469 Other cirrhosis of liver: Secondary | ICD-10-CM | POA: Diagnosis not present

## 2019-04-03 DIAGNOSIS — F331 Major depressive disorder, recurrent, moderate: Secondary | ICD-10-CM | POA: Diagnosis not present

## 2019-04-18 DIAGNOSIS — Z7682 Awaiting organ transplant status: Secondary | ICD-10-CM | POA: Diagnosis not present

## 2019-04-19 DIAGNOSIS — G894 Chronic pain syndrome: Secondary | ICD-10-CM | POA: Diagnosis not present

## 2019-04-19 DIAGNOSIS — F329 Major depressive disorder, single episode, unspecified: Secondary | ICD-10-CM | POA: Diagnosis not present

## 2019-05-08 DIAGNOSIS — H02834 Dermatochalasis of left upper eyelid: Secondary | ICD-10-CM | POA: Diagnosis not present

## 2019-05-08 DIAGNOSIS — H02831 Dermatochalasis of right upper eyelid: Secondary | ICD-10-CM | POA: Diagnosis not present

## 2019-05-14 DIAGNOSIS — Z298 Encounter for other specified prophylactic measures: Secondary | ICD-10-CM | POA: Diagnosis not present

## 2019-05-14 DIAGNOSIS — D849 Immunodeficiency, unspecified: Secondary | ICD-10-CM | POA: Diagnosis not present

## 2019-05-14 DIAGNOSIS — Z7682 Awaiting organ transplant status: Secondary | ICD-10-CM | POA: Diagnosis not present

## 2019-05-17 DIAGNOSIS — G894 Chronic pain syndrome: Secondary | ICD-10-CM | POA: Diagnosis not present

## 2019-05-17 DIAGNOSIS — F419 Anxiety disorder, unspecified: Secondary | ICD-10-CM | POA: Diagnosis not present

## 2019-05-17 DIAGNOSIS — N3941 Urge incontinence: Secondary | ICD-10-CM | POA: Diagnosis not present

## 2019-05-17 DIAGNOSIS — I10 Essential (primary) hypertension: Secondary | ICD-10-CM | POA: Diagnosis not present

## 2019-05-23 DIAGNOSIS — R188 Other ascites: Secondary | ICD-10-CM | POA: Diagnosis not present

## 2019-05-23 DIAGNOSIS — Z7682 Awaiting organ transplant status: Secondary | ICD-10-CM | POA: Diagnosis not present

## 2019-05-23 DIAGNOSIS — C419 Malignant neoplasm of bone and articular cartilage, unspecified: Secondary | ICD-10-CM | POA: Diagnosis not present

## 2019-06-20 DIAGNOSIS — E6609 Other obesity due to excess calories: Secondary | ICD-10-CM | POA: Diagnosis not present

## 2019-06-20 DIAGNOSIS — G894 Chronic pain syndrome: Secondary | ICD-10-CM | POA: Diagnosis not present

## 2019-06-24 DIAGNOSIS — D849 Immunodeficiency, unspecified: Secondary | ICD-10-CM | POA: Diagnosis not present

## 2019-06-24 DIAGNOSIS — Z7682 Awaiting organ transplant status: Secondary | ICD-10-CM | POA: Diagnosis not present

## 2019-06-24 DIAGNOSIS — Z298 Encounter for other specified prophylactic measures: Secondary | ICD-10-CM | POA: Diagnosis not present

## 2019-07-25 DIAGNOSIS — D485 Neoplasm of uncertain behavior of skin: Secondary | ICD-10-CM | POA: Diagnosis not present

## 2019-07-25 DIAGNOSIS — Z6837 Body mass index (BMI) 37.0-37.9, adult: Secondary | ICD-10-CM | POA: Diagnosis not present

## 2019-07-25 DIAGNOSIS — G894 Chronic pain syndrome: Secondary | ICD-10-CM | POA: Diagnosis not present

## 2019-07-25 DIAGNOSIS — B370 Candidal stomatitis: Secondary | ICD-10-CM | POA: Diagnosis not present

## 2019-07-30 DIAGNOSIS — L570 Actinic keratosis: Secondary | ICD-10-CM | POA: Diagnosis not present

## 2019-07-30 DIAGNOSIS — D225 Melanocytic nevi of trunk: Secondary | ICD-10-CM | POA: Diagnosis not present

## 2019-07-30 DIAGNOSIS — X32XXXA Exposure to sunlight, initial encounter: Secondary | ICD-10-CM | POA: Diagnosis not present

## 2019-07-30 DIAGNOSIS — Z1283 Encounter for screening for malignant neoplasm of skin: Secondary | ICD-10-CM | POA: Diagnosis not present

## 2019-09-04 DIAGNOSIS — D638 Anemia in other chronic diseases classified elsewhere: Secondary | ICD-10-CM | POA: Diagnosis not present

## 2019-09-04 DIAGNOSIS — Z7682 Awaiting organ transplant status: Secondary | ICD-10-CM | POA: Diagnosis not present

## 2019-09-04 DIAGNOSIS — Z1159 Encounter for screening for other viral diseases: Secondary | ICD-10-CM | POA: Diagnosis not present

## 2019-09-04 DIAGNOSIS — I129 Hypertensive chronic kidney disease with stage 1 through stage 4 chronic kidney disease, or unspecified chronic kidney disease: Secondary | ICD-10-CM | POA: Diagnosis not present

## 2019-09-04 DIAGNOSIS — D84821 Immunodeficiency due to drugs: Secondary | ICD-10-CM | POA: Diagnosis not present

## 2019-09-04 DIAGNOSIS — Z23 Encounter for immunization: Secondary | ICD-10-CM | POA: Diagnosis not present

## 2019-09-04 DIAGNOSIS — D849 Immunodeficiency, unspecified: Secondary | ICD-10-CM | POA: Diagnosis not present

## 2019-09-04 DIAGNOSIS — Z9489 Other transplanted organ and tissue status: Secondary | ICD-10-CM | POA: Diagnosis not present

## 2019-09-04 DIAGNOSIS — N183 Chronic kidney disease, stage 3 unspecified: Secondary | ICD-10-CM | POA: Diagnosis not present

## 2019-09-04 DIAGNOSIS — I10 Essential (primary) hypertension: Secondary | ICD-10-CM | POA: Diagnosis not present

## 2019-09-04 DIAGNOSIS — N1832 Chronic kidney disease, stage 3b: Secondary | ICD-10-CM | POA: Diagnosis not present

## 2019-09-04 DIAGNOSIS — D631 Anemia in chronic kidney disease: Secondary | ICD-10-CM | POA: Diagnosis not present

## 2019-09-04 DIAGNOSIS — I951 Orthostatic hypotension: Secondary | ICD-10-CM | POA: Diagnosis not present

## 2019-09-04 DIAGNOSIS — Z9483 Pancreas transplant status: Secondary | ICD-10-CM | POA: Diagnosis not present

## 2019-09-04 DIAGNOSIS — Z79899 Other long term (current) drug therapy: Secondary | ICD-10-CM | POA: Diagnosis not present

## 2019-09-04 DIAGNOSIS — Z944 Liver transplant status: Secondary | ICD-10-CM | POA: Diagnosis not present

## 2019-09-04 DIAGNOSIS — Z9482 Intestine transplant status: Secondary | ICD-10-CM | POA: Diagnosis not present

## 2019-09-10 ENCOUNTER — Other Ambulatory Visit: Payer: Self-pay | Admitting: Gastroenterology

## 2019-09-10 DIAGNOSIS — Z1231 Encounter for screening mammogram for malignant neoplasm of breast: Secondary | ICD-10-CM

## 2019-09-16 ENCOUNTER — Other Ambulatory Visit: Payer: Self-pay

## 2019-09-16 ENCOUNTER — Ambulatory Visit
Admission: RE | Admit: 2019-09-16 | Discharge: 2019-09-16 | Disposition: A | Discharge status: Home / Self Care | Payer: Medicare Other | Source: Ambulatory Visit | Attending: Gastroenterology | Admitting: Gastroenterology

## 2019-09-16 DIAGNOSIS — Z1231 Encounter for screening mammogram for malignant neoplasm of breast: Secondary | ICD-10-CM

## 2019-09-20 DIAGNOSIS — I10 Essential (primary) hypertension: Secondary | ICD-10-CM | POA: Diagnosis not present

## 2019-09-20 DIAGNOSIS — Z6836 Body mass index (BMI) 36.0-36.9, adult: Secondary | ICD-10-CM | POA: Diagnosis not present

## 2019-09-20 DIAGNOSIS — N390 Urinary tract infection, site not specified: Secondary | ICD-10-CM | POA: Diagnosis not present

## 2019-09-20 DIAGNOSIS — E669 Obesity, unspecified: Secondary | ICD-10-CM | POA: Diagnosis not present

## 2019-09-24 DIAGNOSIS — F4000 Agoraphobia, unspecified: Secondary | ICD-10-CM | POA: Diagnosis not present

## 2019-09-24 DIAGNOSIS — F331 Major depressive disorder, recurrent, moderate: Secondary | ICD-10-CM | POA: Diagnosis not present

## 2019-10-09 DIAGNOSIS — Z7682 Awaiting organ transplant status: Secondary | ICD-10-CM | POA: Diagnosis not present

## 2019-10-09 DIAGNOSIS — D849 Immunodeficiency, unspecified: Secondary | ICD-10-CM | POA: Diagnosis not present

## 2019-10-09 DIAGNOSIS — Z298 Encounter for other specified prophylactic measures: Secondary | ICD-10-CM | POA: Diagnosis not present

## 2019-11-04 IMAGING — CT CT ABD-PELV W/O CM
2 of 4 series · 16 of 46 positions shown, 18 images · non-contrast
Comparison: 07/02/2016

CLINICAL DATA: Diarrhea and fever since discharge from [REDACTED] on [REDACTED]. Symptoms worse today. Multiple organ
transplants in [REDACTED].

EXAM:
CT ABDOMEN AND PELVIS WITHOUT CONTRAST
TECHNIQUE: Multidetector CT imaging of the abdomen and pelvis was performed
following the standard protocol without IV contrast.

[Series 2: axial st · axial · 0.96mm/px · z∈[+716,+1151]mm · 13 of 97 slices shown, 15 images]
[im 5/97  soft-tissue]
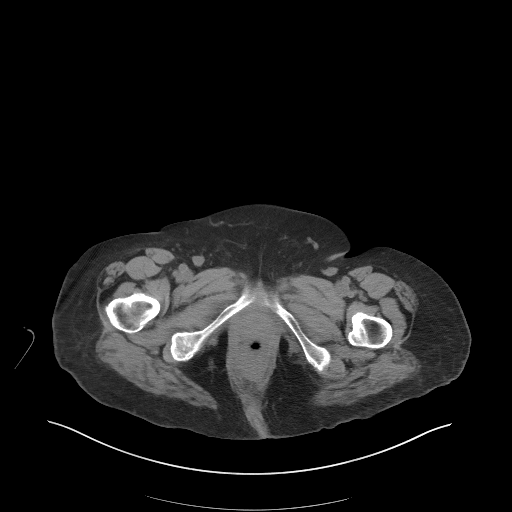
[im 5/97  bone]
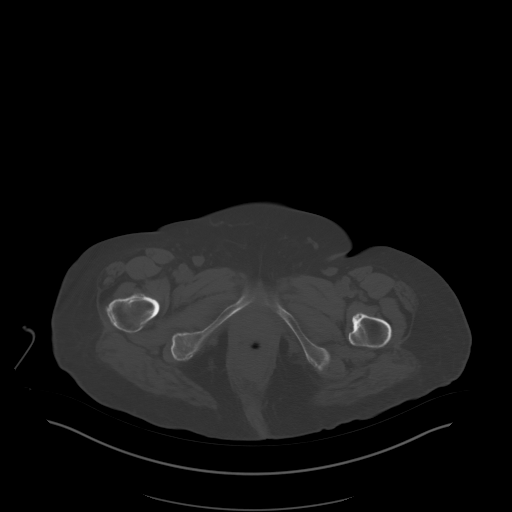
[im 13/97  soft-tissue]
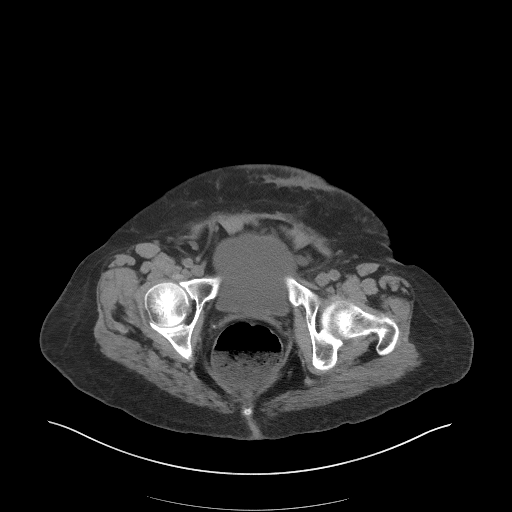
[im 21/97  soft-tissue]
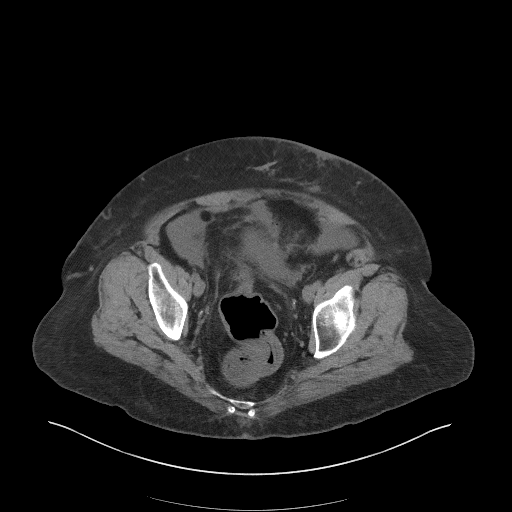
[im 26/97  soft-tissue]
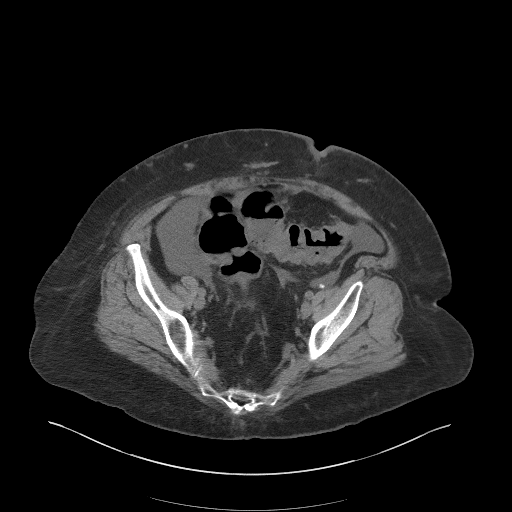
[im 34/97  soft-tissue]
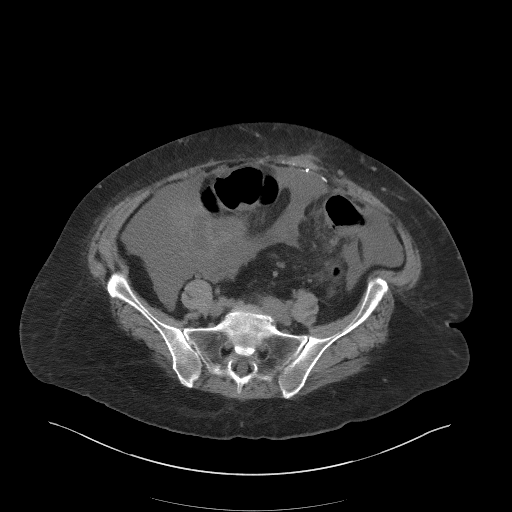
[im 42/97  soft-tissue]
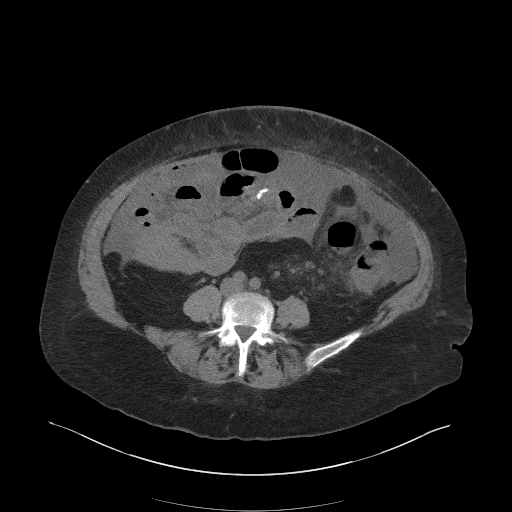
[im 51/97  soft-tissue]
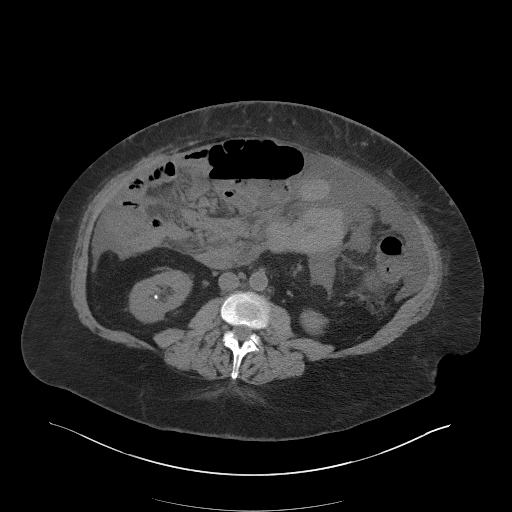
[im 55/97  soft-tissue]
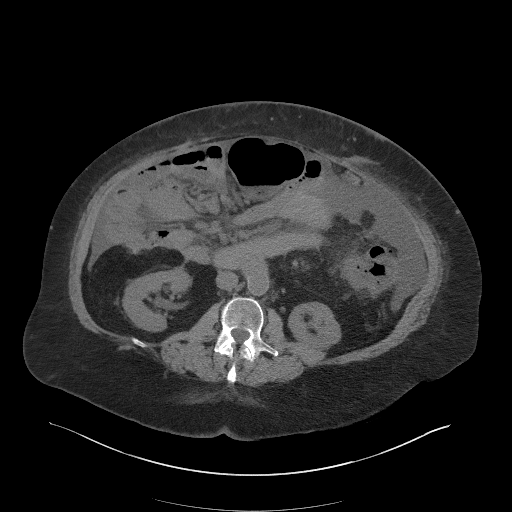
[im 63/97  soft-tissue]
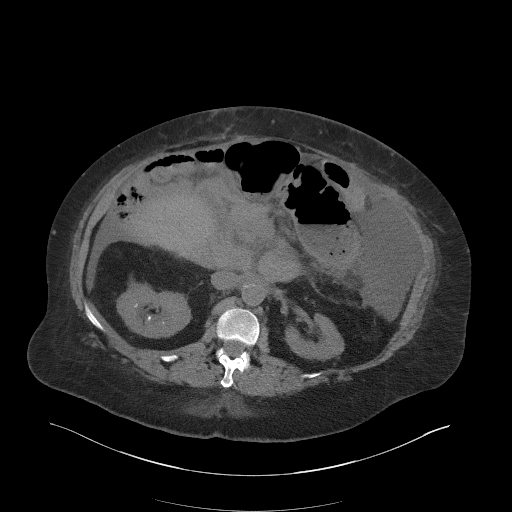
[im 63/97  bone]
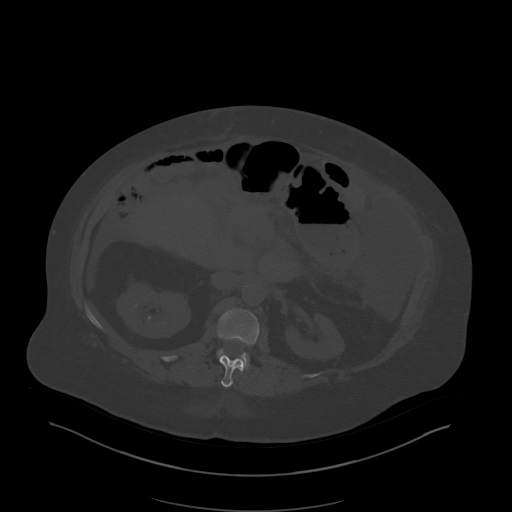
[im 71/97  soft-tissue]
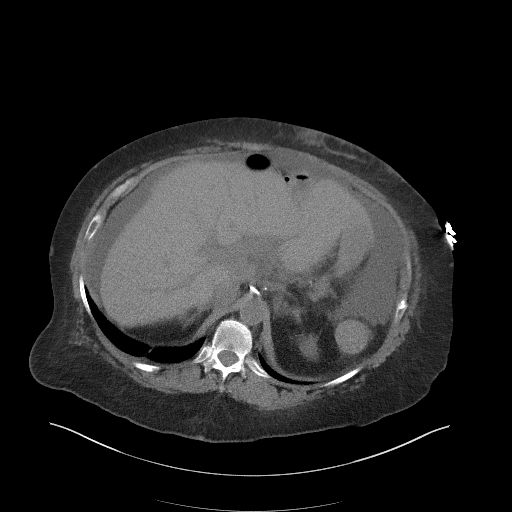
[im 76/97  soft-tissue]
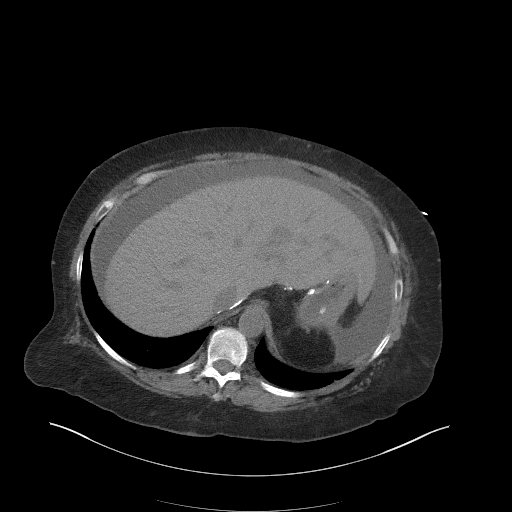
[im 84/97  soft-tissue]
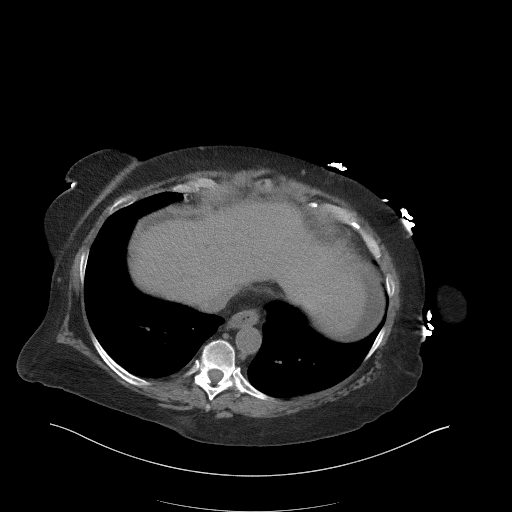
[im 92/97  soft-tissue]
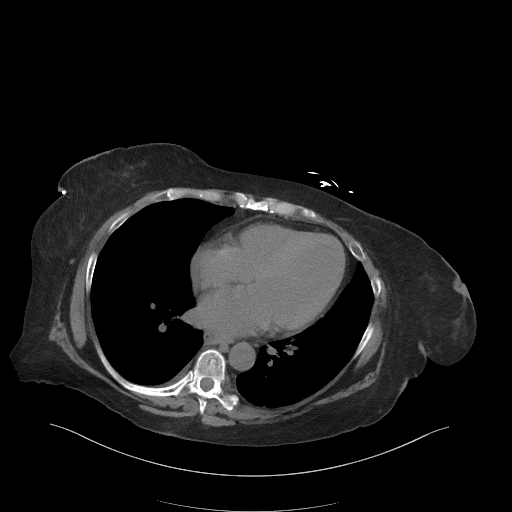

[Series 5: coronal st · coronal · 0.94mm/px · 3 of 130 slices shown]
[im 44/130  soft-tissue]
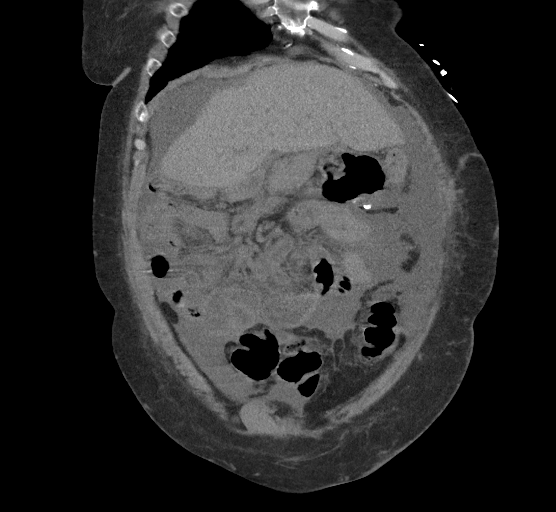
[im 58/130  soft-tissue]
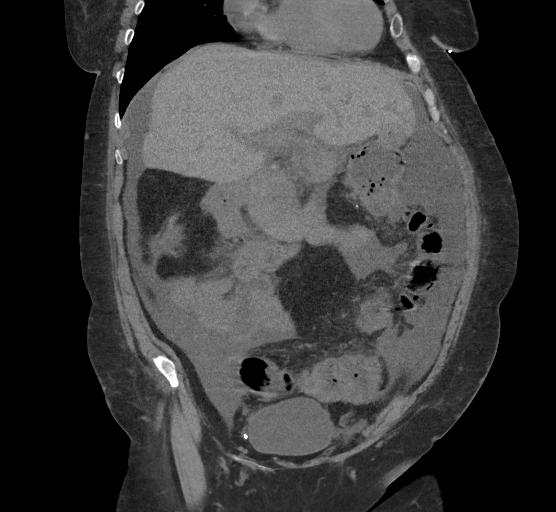
[im 72/130  soft-tissue]
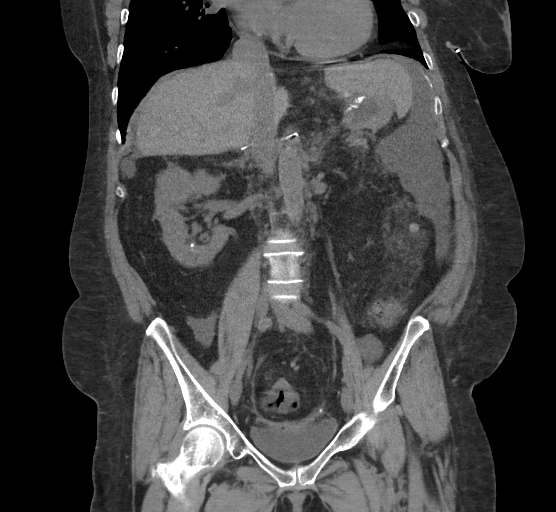

[16 of 46 positions shown; findings below may reference images not displayed]

FINDINGS: Lower chest: Lung bases are clear. Mild cardiac enlargement with
minimal pericardial effusion.

Hepatobiliary: Liver configuration and parenchymal pattern are
homogeneous and normal. Presumably this represents an interval liver
transplants since previous study. No focal lesions identified.
Gallbladder is absent.

Pancreas: Limited visualization of the pancreas. Visualized portion
appears intact.

Spleen: Spleen is surgically absent.

Adrenals/Urinary Tract: No adrenal gland nodules. Multiple
calcifications throughout the right kidney, largest in the upper
pole measuring 9 mm in diameter. No hydronephrosis or hydroureter.
No ureteral stones identified. Left kidney in ureter and the bladder
are unremarkable.

Stomach/Bowel: Stomach is decompressed. Small bowel are mostly
decompressed. Colon is diffusely stool-filled. No definite evidence
of wall thickening although abdominal fluid limits evaluation of the
bowel wall on unenhanced scan. Prominent mesenteric vascularity is
nonspecific and could represent varices. Visualization is limited
due to mesenteric fluid. Scattered diverticula in the sigmoid colon.

Vascular/Lymphatic: No aortic aneurysm. Scattered aortic
calcifications. No significant lymphadenopathy.

Reproductive: Status post hysterectomy. No adnexal masses.

Other: There is diffuse free fluid throughout the abdomen and pelvis
and throughout the mesentery. Hounsfield unit measurements are
consistent with non hemorrhagic fluid. No free air in the abdomen.
Postoperative changes over the anterior abdominal wall. Surgical
clips in the abdomen.

Musculoskeletal: No acute or significant osseous findings.

Unenhanced CT was performed per order. Lack of IV contrast limits
sensitivity and specificity, especially for evaluation of
abdominal/pelvic solid viscera.
IMPRESSION: 1. There appears to been a liver transplant since the previous
study. Resection of the spleen. Possible pancreatic surgery.
Gallbladder is absent.
2. Large amount of free fluid throughout the abdomen and pelvis and
in the mesentery. No free air. This is likely due to ascites.
3. No definite evidence of bowel obstruction or inflammation
although evaluation of the bowel wall is limited due to the large
amount of fluid.
4. Nonobstructing stones in the right kidney similar to prior study.

## 2019-11-08 DIAGNOSIS — F419 Anxiety disorder, unspecified: Secondary | ICD-10-CM | POA: Diagnosis not present

## 2019-11-08 DIAGNOSIS — G894 Chronic pain syndrome: Secondary | ICD-10-CM | POA: Diagnosis not present

## 2019-11-08 DIAGNOSIS — E119 Type 2 diabetes mellitus without complications: Secondary | ICD-10-CM | POA: Diagnosis not present

## 2019-11-08 DIAGNOSIS — Z0001 Encounter for general adult medical examination with abnormal findings: Secondary | ICD-10-CM | POA: Diagnosis not present

## 2019-11-08 DIAGNOSIS — Z6837 Body mass index (BMI) 37.0-37.9, adult: Secondary | ICD-10-CM | POA: Diagnosis not present

## 2019-11-13 DIAGNOSIS — Z6837 Body mass index (BMI) 37.0-37.9, adult: Secondary | ICD-10-CM | POA: Diagnosis not present

## 2019-11-13 DIAGNOSIS — Z1389 Encounter for screening for other disorder: Secondary | ICD-10-CM | POA: Diagnosis not present

## 2019-11-13 DIAGNOSIS — Z0001 Encounter for general adult medical examination with abnormal findings: Secondary | ICD-10-CM | POA: Diagnosis not present

## 2019-11-13 DIAGNOSIS — Z23 Encounter for immunization: Secondary | ICD-10-CM | POA: Diagnosis not present

## 2019-11-22 DIAGNOSIS — G4709 Other insomnia: Secondary | ICD-10-CM | POA: Diagnosis not present

## 2019-11-22 DIAGNOSIS — R6889 Other general symptoms and signs: Secondary | ICD-10-CM | POA: Diagnosis not present

## 2019-11-22 DIAGNOSIS — F331 Major depressive disorder, recurrent, moderate: Secondary | ICD-10-CM | POA: Diagnosis not present

## 2019-11-26 DIAGNOSIS — D849 Immunodeficiency, unspecified: Secondary | ICD-10-CM | POA: Diagnosis not present

## 2019-11-26 DIAGNOSIS — C419 Malignant neoplasm of bone and articular cartilage, unspecified: Secondary | ICD-10-CM | POA: Diagnosis not present

## 2019-11-26 DIAGNOSIS — Z7682 Awaiting organ transplant status: Secondary | ICD-10-CM | POA: Diagnosis not present

## 2019-12-05 DIAGNOSIS — Z944 Liver transplant status: Secondary | ICD-10-CM | POA: Diagnosis not present

## 2019-12-05 DIAGNOSIS — Z9482 Intestine transplant status: Secondary | ICD-10-CM | POA: Diagnosis not present

## 2019-12-12 DIAGNOSIS — D849 Immunodeficiency, unspecified: Secondary | ICD-10-CM | POA: Diagnosis not present

## 2019-12-12 DIAGNOSIS — Z9482 Intestine transplant status: Secondary | ICD-10-CM | POA: Diagnosis not present

## 2019-12-20 DIAGNOSIS — F330 Major depressive disorder, recurrent, mild: Secondary | ICD-10-CM | POA: Diagnosis not present

## 2019-12-20 DIAGNOSIS — G4709 Other insomnia: Secondary | ICD-10-CM | POA: Diagnosis not present

## 2019-12-20 DIAGNOSIS — Z7682 Awaiting organ transplant status: Secondary | ICD-10-CM | POA: Diagnosis not present

## 2019-12-27 DIAGNOSIS — G894 Chronic pain syndrome: Secondary | ICD-10-CM | POA: Diagnosis not present

## 2019-12-27 DIAGNOSIS — Z6836 Body mass index (BMI) 36.0-36.9, adult: Secondary | ICD-10-CM | POA: Diagnosis not present

## 2019-12-27 DIAGNOSIS — I10 Essential (primary) hypertension: Secondary | ICD-10-CM | POA: Diagnosis not present

## 2020-01-14 DIAGNOSIS — Z9482 Intestine transplant status: Secondary | ICD-10-CM | POA: Diagnosis not present

## 2020-01-14 DIAGNOSIS — D849 Immunodeficiency, unspecified: Secondary | ICD-10-CM | POA: Diagnosis not present

## 2020-01-30 DIAGNOSIS — G894 Chronic pain syndrome: Secondary | ICD-10-CM | POA: Diagnosis not present

## 2020-01-30 DIAGNOSIS — I10 Essential (primary) hypertension: Secondary | ICD-10-CM | POA: Diagnosis not present

## 2020-01-30 DIAGNOSIS — K219 Gastro-esophageal reflux disease without esophagitis: Secondary | ICD-10-CM | POA: Diagnosis not present

## 2020-02-13 DIAGNOSIS — Z9482 Intestine transplant status: Secondary | ICD-10-CM | POA: Diagnosis not present

## 2020-02-13 DIAGNOSIS — D849 Immunodeficiency, unspecified: Secondary | ICD-10-CM | POA: Diagnosis not present

## 2020-02-20 DIAGNOSIS — H02403 Unspecified ptosis of bilateral eyelids: Secondary | ICD-10-CM | POA: Diagnosis not present

## 2020-02-28 DIAGNOSIS — R6889 Other general symptoms and signs: Secondary | ICD-10-CM | POA: Diagnosis not present

## 2020-02-28 DIAGNOSIS — G4709 Other insomnia: Secondary | ICD-10-CM | POA: Diagnosis not present

## 2020-02-28 DIAGNOSIS — R5383 Other fatigue: Secondary | ICD-10-CM | POA: Diagnosis not present

## 2020-02-28 DIAGNOSIS — F330 Major depressive disorder, recurrent, mild: Secondary | ICD-10-CM | POA: Diagnosis not present

## 2020-03-11 DIAGNOSIS — N1832 Chronic kidney disease, stage 3b: Secondary | ICD-10-CM | POA: Diagnosis not present

## 2020-03-11 DIAGNOSIS — D638 Anemia in other chronic diseases classified elsewhere: Secondary | ICD-10-CM | POA: Diagnosis not present

## 2020-03-12 DIAGNOSIS — G894 Chronic pain syndrome: Secondary | ICD-10-CM | POA: Diagnosis not present

## 2020-04-03 DIAGNOSIS — F330 Major depressive disorder, recurrent, mild: Secondary | ICD-10-CM | POA: Diagnosis not present

## 2020-04-03 DIAGNOSIS — R6889 Other general symptoms and signs: Secondary | ICD-10-CM | POA: Diagnosis not present

## 2020-05-08 DIAGNOSIS — I209 Angina pectoris, unspecified: Secondary | ICD-10-CM | POA: Diagnosis not present

## 2020-05-08 DIAGNOSIS — I8500 Esophageal varices without bleeding: Secondary | ICD-10-CM | POA: Diagnosis not present

## 2020-05-08 DIAGNOSIS — G894 Chronic pain syndrome: Secondary | ICD-10-CM | POA: Diagnosis not present

## 2020-05-08 DIAGNOSIS — B182 Chronic viral hepatitis C: Secondary | ICD-10-CM | POA: Diagnosis not present

## 2020-05-13 ENCOUNTER — Encounter: Payer: Self-pay | Admitting: Cardiology

## 2020-05-13 ENCOUNTER — Encounter: Payer: Self-pay | Admitting: *Deleted

## 2020-05-13 NOTE — Progress Notes (Signed)
Cardiology Office Note  Date: 05/14/2020   ID: [name], [name] 1960-07-27, MRN [id number]  PCP:  Redmond School, MD  Cardiologist:  Rozann Lesches, MD Electrophysiologist:  None   Chief Complaint  Patient presents with  . Exertional chest pain    History of Present Illness: [name] is a medically complex 60 y.o. female referred for cardiology consultation by Ms. Glennon Mac PA-C with [facility] for the evaluation of chest pain.  She tells me that over the last 3 weeks she has been experiencing recurring chest tightness and shortness of breath with typical ADLs including housework, vacuuming, walking at the store.  She has to stop what she is doing for symptoms to resolve. 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  Also has CKD stage IV.  Recent creatinine is up to 2.8.  She continues to follow at [facility] on a regular basis.  States that her blood pressure has been trending up.  She did have evidence of moderate LAD and three-vessel coronary artery calcification by CT imaging done at [facility] in 2019.  Previous myocardial perfusion imaging in 2018 revealed LVEF 78% with no significant perfusion defects to indicate ischemia.  I personally reviewed her recent ECG per PCP office showing normal sinus rhythm.  Lab work from October 2021 showed normal LFTs.  LDL was 148 at that time.  She reports a family history of premature heart disease in her father.  Past Medical History:  Diagnosis Date  . Anemia   . Anxiety   . Ascites   . CKD (chronic kidney disease) stage 4, GFR 15-29 ml/min (HCC)   . Depression   . Enteropathy 07/31/2015   Portal hypertensive enteropathy per capsule study, with stigmata of bleeding.  . Esophageal varices ([place])   . Gastric ulcer 07/15/2015   EGD; no stigmata of bleeding  . GI bleed 07/17/2015  .  Gram-negative bacteremia 12/08/2011  . Headache(784.0)   . Heart murmur   . History of cirrhosis   . History of hepatitis C - successfuly treated medically 07/14/2015  . Nephrolithiasis   . Portal vein thrombosis   . Right ureteral stone 12/07/2011  . Status post liver transplant ([place]) 08/2016   [facility]  . 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 Type 2 diabetes mellitus ([place])     Past Surgical History:  Procedure Laterality Date  . ABDOMINAL HYSTERECTOMY  2003  . CESAREAN SECTION  1995  . CHOLECYSTECTOMY  1987  . COLONOSCOPY    . COLONOSCOPY  10/21/2011   Procedure: COLONOSCOPY;  Surgeon: Rogene Houston, MD;  [facility];  Service: Endoscopy;  Laterality: N/A;  245   . CYSTOSCOPY W/ RETROGRADES  12/08/2011   Procedure: CYSTOSCOPY WITH RETROGRADE PYELOGRAM;  Surgeon: Marissa Nestle, MD;  Location: [facility] ORS;  Service: Urology;  Laterality: Right;  . CYSTOSCOPY WITH STENT PLACEMENT  12/08/2011   Procedure: CYSTOSCOPY WITH STENT PLACEMENT;  Surgeon: Marissa Nestle, MD;  Location: [facility] ORS;  Service: Urology;  Laterality: Right;  . DILATION AND CURETTAGE OF UTERUS    . ESOPHAGOGASTRODUODENOSCOPY N/A 02/19/2014   Procedure: ESOPHAGOGASTRODUODENOSCOPY (EGD);  Surgeon: Rogene Houston, MD;  Location: [facility];  Service:  Endoscopy;  Laterality: N/A;  100  . ESOPHAGOGASTRODUODENOSCOPY N/A 07/15/2015   Procedure: ESOPHAGOGASTRODUODENOSCOPY (EGD);  Surgeon: Rogene Houston, MD;  [facility];  Service: Endoscopy;  Laterality: N/A;  . ESOPHAGOGASTRODUODENOSCOPY N/A 07/29/2015   Procedure: ESOPHAGOGASTRODUODENOSCOPY (EGD);  Surgeon: Rogene Houston, MD;  [facility];  Service: Endoscopy;  Laterality: N/A;  . GASTRIC BYPASS  ~ 1995  . HERNIA REPAIR  04/14/11   "one in my bellybutton" (11/07/2012)  . INGUINAL HERNIA REPAIR Right     "maybe 2" (11/07/2012)  . KNEE ARTHROSCOPY Right   . LITHOTRIPSY     "several times" (11/07/2012)  . OPEN REDUCTION INTERNAL FIXATION (ORIF) DISTAL RADIAL FRACTURE Right 11/07/2012   Procedure: OPEN REDUCTION INTERNAL FIXATION (ORIF) RIGHT DISTAL RADIAL FRACTURE;  Surgeon: [name], MD;  Location: [place];  Service: Orthopedics;  Laterality: Right;  . ORIF DISTAL RADIUS FRACTURE Right 11/07/2012  . OTHER SURGICAL HISTORY  08/14/2016   5 organ transplant- liver, stomach, pancreas, small and large intestine  . UPPER GASTROINTESTINAL ENDOSCOPY      Current Outpatient Medications  Medication Sig Dispense Refill  . aspirin EC 81 MG tablet Take 1 tablet (81 mg total) by mouth daily. Swallow whole. 90 tablet 3  . atenolol (TENORMIN) 25 MG tablet Take 25 mg by mouth 2 (two) times daily.    Marland Kitchen buPROPion (WELLBUTRIN) 100 MG tablet Take 100 mg by mouth 2 (two) times daily.    . calcium-vitamin D (OSCAL WITH D) 500-200 MG-UNIT tablet Take 2 tablets by mouth 2 (two) times daily.    . cycloSPORINE (SANDIMMUNE) 100 MG capsule Take 100 mg by mouth daily.    . cycloSPORINE (SANDIMMUNE) 25 MG capsule Take 25 mg by mouth 2 (two) times daily.    . cycloSPORINE modified (NEORAL) 100 MG capsule Take 150 mg by mouth 2 (two) times daily. Gengraf  300mg  every morning and 200mg  every night    . FLUoxetine (PROZAC) 10 MG tablet Take 10 mg by mouth daily.    . isosorbide mononitrate (IMDUR) 30 MG 24 hr tablet Take 1 tablet (30 mg total) by mouth every evening. 90 tablet 3  . methylphenidate (RITALIN) 10 MG tablet Take 20 mg by mouth daily.    . Multiple Vitamins-Minerals (SUPER THERA VITE M PO) Take 1 tablet by mouth daily.    . mycophenolate (CELLCEPT) 250 MG capsule Take 750 mg by mouth every 12 (twelve) hours.     Marland Kitchen oxyCODONE (OXYCONTIN) 10 mg 12 hr tablet Take 10 mg by mouth every 12 (twelve) hours.    . pregabalin (LYRICA) 25 MG capsule Take 25-50 mg by mouth 2 (two) times daily. Take 25 mg in the morning and  50 mg in the evening.    Marland Kitchen QUEtiapine (SEROQUEL) 100 MG tablet Take 100 mg by mouth at bedtime.    Marland Kitchen QUEtiapine (SEROQUEL) 50 MG tablet Take 25 mg by mouth every 8 (eight) hours as needed.     . sodium bicarbonate 650 MG tablet Take 650 mg by mouth 2 (two) times daily.    . Vitamin D, Ergocalciferol, (DRISDOL) 50000 units CAPS capsule Take 50,000 Units by mouth every 7 (seven) days. Take on Friday.     No current facility-administered medications for this visit.   Allergies:  Ancef [cefazolin sodium], Tapentadol, Gadobenate, Iodinated diagnostic agents, and Tape   Social History: The patient  reports that she quit smoking about 29 years ago. Her smoking use included cigarettes. She has a 2.50  pack-year smoking history. She has never used smokeless tobacco. She reports that she does not drink alcohol and does not use drugs.   Family History: The patient's family history includes Dementia in her father and mother; Diabetes in her father; Heart disease in her father; Hypertension in her brother and father; Liver disease in her father.   ROS: No palpitations or syncope.  Physical Exam: VS:  BP 130/90   Pulse (!) 54   Ht 5\' 5"  (1.651 m)   Wt 232 lb (105.2 kg)   SpO2 94%   BMI 38.61 kg/m , BMI Body mass index is 38.61 kg/m.  Wt Readings from Last 3 Encounters:  05/14/20 232 lb (105.2 kg)  04/16/17 182 lb (82.6 kg)  03/21/17 182 lb (82.6 kg)    General: Patient appears comfortable at rest. HEENT: Conjunctiva and lids normal, wearing a mask. Neck: Supple, no elevated JVP or carotid bruits, no thyromegaly. Lungs: Clear to auscultation, nonlabored breathing at rest. Cardiac: Regular rate and rhythm, no S3 or significant systolic murmur, no pericardial rub. Abdomen: Soft, nontender, bowel sounds present. Extremities: No pitting edema, distal pulses 2+. Skin: Warm and dry. Musculoskeletal: No kyphosis. Neuropsychiatric: Alert and oriented x3, affect grossly appropriate.  ECG:  An ECG  dated 04/16/2017 was personally reviewed today and demonstrated:  Normal sinus rhythm.  Recent Labwork:  October 2021: Hemoglobin 11.5, platelets 390, BUN 25, creatinine 1.75, potassium 5.5, AST 14, ALT 13, cholesterol 214, triglycerides 127, HDL 43, LDL 148, TSH 2.31 March 2020: Potassium 5.4, BUN 23, creatinine 2.8, hemoglobin 11.1, platelets 371  Other Studies Reviewed Today:  No recent cardiac imaging studies.  Assessment and Plan:  Exertional angina in a medically complex 60 year old woman with history of type 2 diabetes mellitus, mixed hyperlipidemia with LDL 148, family history of premature heart disease in her father, documented multivessel coronary artery calcification by CT imaging in 2019, CKD stage IV with recent creatinine 2.8, and multivisceral transplant including liver/pancreas/small bowel in July 2018 at [facility].  I reviewed her recent ECG which is normal.  Also reviewed her medications which at this point include atenolol from a cardiac perspective.  Recommend starting aspirin 81 mg daily, Imdur 30 mg daily, also statin therapy to get LDL down below 70 if possible.  Plan to have Pharm.D. review her medication list and assist with choice of statin that would be least likely to interact with her current regimen.  We will obtain an echocardiogram and Lexiscan Myoview to further risk stratify, although I would be hesitant to jump to a heart catheterization unless absolutely necessary in light of high risk of contrast nephropathy.  We will schedule office follow-up for further discussion.  I did discuss with her warning signs and symptoms that would prompt urgent ER evaluation.   Medication Adjustments/Labs and Tests Ordered: Current medicines are reviewed at length with the patient today.  Concerns regarding medicines are outlined above.   Tests Ordered: Orders Placed This Encounter  Procedures  . NM Myocar Multi W/Spect W/Wall Motion / EF  . ECHOCARDIOGRAM COMPLETE     Medication Changes: Meds ordered this encounter  Medications  . aspirin EC 81 MG tablet    Sig: Take 1 tablet (81 mg total) by mouth daily. Swallow whole.    Dispense:  90 tablet    Refill:  3    05/14/2020 NEW  . isosorbide mononitrate (IMDUR) 30 MG 24 hr tablet    Sig: Take 1 tablet (30 mg total) by mouth every evening.  Dispense:  90 tablet    Refill:  3    05/14/2020 NEW    Disposition:  Follow up 1 month.  Signed, Satira Sark, MD, [facility] 05/14/2020 10:51 AM    [facility] [place], [place], [place] [address] Phone: [phone]; Fax: [phone]

## 2020-05-14 ENCOUNTER — Ambulatory Visit (INDEPENDENT_AMBULATORY_CARE_PROVIDER_SITE_OTHER): Payer: Medicare Other

## 2020-05-14 ENCOUNTER — Encounter: Payer: Self-pay | Admitting: Cardiology

## 2020-05-14 ENCOUNTER — Other Ambulatory Visit: Payer: Self-pay

## 2020-05-14 ENCOUNTER — Encounter: Payer: Self-pay | Admitting: *Deleted

## 2020-05-14 ENCOUNTER — Ambulatory Visit: Payer: Medicare Other | Admitting: Cardiology

## 2020-05-14 ENCOUNTER — Telehealth: Payer: Self-pay | Admitting: *Deleted

## 2020-05-14 ENCOUNTER — Telehealth: Payer: Self-pay | Admitting: Cardiology

## 2020-05-14 VITALS — BP 130/90 | HR 54 | Ht 65.0 in | Wt 232.0 lb

## 2020-05-14 DIAGNOSIS — R079 Chest pain, unspecified: Secondary | ICD-10-CM

## 2020-05-14 DIAGNOSIS — E782 Mixed hyperlipidemia: Secondary | ICD-10-CM | POA: Diagnosis not present

## 2020-05-14 DIAGNOSIS — Z79899 Other long term (current) drug therapy: Secondary | ICD-10-CM

## 2020-05-14 DIAGNOSIS — I208 Other forms of angina pectoris: Secondary | ICD-10-CM

## 2020-05-14 DIAGNOSIS — Z944 Liver transplant status: Secondary | ICD-10-CM

## 2020-05-14 DIAGNOSIS — N184 Chronic kidney disease, stage 4 (severe): Secondary | ICD-10-CM

## 2020-05-14 LAB — ECHOCARDIOGRAM COMPLETE
Area-P 1/2: 4.17 cm2
Calc EF: 65.5 %
Height: 65 in
MV M vel: 1.93 m/s
MV Peak grad: 14.9 mmHg
S' Lateral: 3.12 cm
Single Plane A2C EF: 63.5 %
Single Plane A4C EF: 67 %
Weight: 3712 oz

## 2020-05-14 MED ORDER — ASPIRIN EC 81 MG PO TBEC: 81.0000 mg | DELAYED_RELEASE_TABLET | Freq: Every day | ORAL | 3 refills | Status: AC

## 2020-05-14 MED ORDER — ISOSORBIDE MONONITRATE ER 30 MG PO TB24: 30.0000 mg | ORAL_TABLET | Freq: Every evening | ORAL | 3 refills | Status: DC

## 2020-05-14 NOTE — Patient Instructions (Addendum)
Medication Instructions:   Your physician has recommended you make the following change in your medication:   Start aspirin 81 mg by mouth daily  Start isosorbide mononitrate 15-30 mg daily in the evening. Start with 1/2 tablet then gradually increase to whole tablet.  Continue other medications the same  We are checking with our pharmacist to see which statin medication will work best with your other medicatons  Labwork:  none  Testing/Procedures: Your physician has requested that you have an echocardiogram. Echocardiography is a painless test that uses sound waves to create images of your heart. It provides your doctor with information about the size and shape of your heart and how well your heart's chambers and valves are working. This procedure takes approximately one hour. There are no restrictions for this procedure. Your physician has requested that you have a lexiscan myoview. For further information please visit [email or web address]. Please follow instruction sheet, as given.  Follow-Up:  Your physician recommends that you schedule a follow-up appointment in: 4 weeks at the [place] office.  Any Other Special Instructions Will Be Listed Below (If Applicable).  If you need a refill on your cardiac medications before your next appointment, please call your pharmacy.

## 2020-05-14 NOTE — Telephone Encounter (Signed)
Pre-cert Verification for the following procedure    ECHO   DATE:  05/14/2020  LOCATION: [facility]  

## 2020-05-14 NOTE — Telephone Encounter (Deleted)
-----   Message from Satira Sark, MD sent at 05/14/2020 12:47 PM EDT ----- Regarding: RE: best statin drug for this patient Thank you.  Please make a note in the chart to document recommendation and let's start with fluvastatin 20 mg every evening.  Check FLP and LFTs in 8 weeks. ----- Message ----- From: Merlene Laughter, RN Sent: 05/14/2020  11:45 AM EDT To: Satira Sark, MD Subject: FW: best statin drug for this patient           ----- Message ----- From: Leeroy Bock, RPH-CPP Sent: 05/14/2020  11:34 AM EDT To: Merlene Laughter, RN Subject: RE: best statin drug for this patient          Fluvastatin is the statin least affected by an interaction with her cyclosporine. Dose of fluvastatin should be limited to 20mg  BID for patients also on cyclosporine, and she should monitor for signs of statin toxicity (myalgias).  Thanks, Jinny Blossom ----- Message ----- From: Merlene Laughter, RN Sent: 05/14/2020  10:52 AM EDT To: [facility] Subject: best statin drug for this patient              Dr. Domenic Polite would like for you all to take a look at her medications, especially transplant and immunosuppressant drugs, to see which statin drug will work best for her.(causing the least problems and interactions)  Thanks for your help

## 2020-05-14 NOTE — Telephone Encounter (Signed)
Pre-cert Verification for the following procedure     LEXISCAN   DATE:05/25/2020  LOCATION: [facility]  

## 2020-05-14 NOTE — Telephone Encounter (Addendum)
-----   Message from Satira Sark, MD sent at 05/14/2020 12:47 PM EDT ----- Regarding: RE: best statin drug for this patient Thank you.  Please make a note in the chart to document recommendation and let's start with fluvastatin 20 mg every evening.  Check FLP and LFTs in 8 weeks. ----- Message ----- From: Merlene Laughter, RN Sent: 05/14/2020  11:45 AM EDT To: Satira Sark, MD Subject: FW: best statin drug for this patient           ----- Message ----- From: Leeroy Bock, RPH-CPP Sent: 05/14/2020  11:34 AM EDT To: Merlene Laughter, RN Subject: RE: best statin drug for this patient          Fluvastatin is the statin least affected by an interaction with her cyclosporine. Dose of fluvastatin should be limited to 20mg  BID for patients also on cyclosporine, and she should monitor for signs of statin toxicity (myalgias).  Thanks, [employer]

## 2020-05-15 ENCOUNTER — Telehealth: Payer: Self-pay | Admitting: *Deleted

## 2020-05-15 ENCOUNTER — Other Ambulatory Visit: Payer: Self-pay | Admitting: *Deleted

## 2020-05-15 DIAGNOSIS — Z79899 Other long term (current) drug therapy: Secondary | ICD-10-CM

## 2020-05-15 DIAGNOSIS — E782 Mixed hyperlipidemia: Secondary | ICD-10-CM

## 2020-05-15 MED ORDER — FLUVASTATIN SODIUM 20 MG PO CAPS
20.0000 mg | ORAL_CAPSULE | Freq: Every day | ORAL | 2 refills | Status: DC
Start: 2020-05-15 — End: 2020-11-06

## 2020-05-15 NOTE — Telephone Encounter (Signed)
Patient informed and verbalized understanding of plan. Lab orders faxed to Lab [employer] Dr [name]

## 2020-05-15 NOTE — Telephone Encounter (Signed)
-----   Message from Satira Sark, MD sent at 05/14/2020  3:18 PM EDT ----- Results reviewed.  LVEF is normal at 60 to 65%.  No significant valvular abnormalities.  Follow through on stress testing as ordered.

## 2020-05-15 NOTE — Telephone Encounter (Signed)
Patient informed. Copy sent to PCP °

## 2020-05-25 ENCOUNTER — Encounter (HOSPITAL_COMMUNITY)
Admission: RE | Admit: 2020-05-25 | Discharge: 2020-05-25 | Disposition: A | Discharge status: Home / Self Care | Payer: Medicare Other | Source: Ambulatory Visit | Attending: Cardiology | Admitting: Cardiology

## 2020-05-25 ENCOUNTER — Encounter (HOSPITAL_BASED_OUTPATIENT_CLINIC_OR_DEPARTMENT_OTHER)
Admission: RE | Admit: 2020-05-25 | Discharge: 2020-05-25 | Disposition: A | Discharge status: Home / Self Care | Payer: Medicare Other | Source: Ambulatory Visit | Attending: Cardiology | Admitting: Cardiology

## 2020-05-25 DIAGNOSIS — R079 Chest pain, unspecified: Secondary | ICD-10-CM | POA: Diagnosis not present

## 2020-05-25 DIAGNOSIS — I208 Other forms of angina pectoris: Secondary | ICD-10-CM

## 2020-05-25 LAB — NM MYOCAR MULTI W/SPECT W/WALL MOTION / EF
LV dias vol: 76 mL (ref 46–106)
LV sys vol: 18 mL
Peak HR: 71 {beats}/min
RATE: 0.64
Rest HR: 45 {beats}/min
SDS: 6
SRS: 3
SSS: 9
TID: 0.88

## 2020-05-25 MED ORDER — SODIUM CHLORIDE FLUSH 0.9 % IV SOLN
INTRAVENOUS | Status: AC
  Administered 2020-05-25: 10 mL via INTRAVENOUS
  Filled 2020-05-25: qty 10

## 2020-05-25 MED ORDER — TECHNETIUM TC 99M TETROFOSMIN IV KIT
30.0000 | PACK | Freq: Once | INTRAVENOUS | Status: AC | PRN
  Administered 2020-05-25: 30.5 via INTRAVENOUS

## 2020-05-25 MED ORDER — REGADENOSON 0.4 MG/5ML IV SOLN
INTRAVENOUS | Status: AC
  Administered 2020-05-25: 0.4 mg via INTRAVENOUS
  Filled 2020-05-25: qty 5

## 2020-05-25 MED ORDER — TECHNETIUM TC 99M TETROFOSMIN IV KIT
10.0000 | PACK | Freq: Once | INTRAVENOUS | Status: AC | PRN
  Administered 2020-05-25: 10 via INTRAVENOUS

## 2020-05-27 ENCOUNTER — Telehealth: Payer: Self-pay | Admitting: *Deleted

## 2020-05-27 NOTE — Telephone Encounter (Signed)
Patient informed. Copy sent to PCP °

## 2020-05-27 NOTE — Telephone Encounter (Signed)
-----   Message from Satira Sark, MD sent at 05/25/2020  1:05 PM EDT ----- Results reviewed.  I also reviewed the images myself.  Myoview is consistent with ischemic heart disease, there is a moderate sized ischemic territory in the LAD distribution, LVEF is normal.  As per recent office note, we will try medical therapy first given high likelihood of contrast-induced nephropathy if we were to proceed with a cardiac catheterization.  Keep scheduled follow-up.

## 2020-06-11 ENCOUNTER — Ambulatory Visit: Payer: Medicare Other | Admitting: Cardiology

## 2020-06-11 ENCOUNTER — Other Ambulatory Visit: Payer: Self-pay

## 2020-06-11 ENCOUNTER — Encounter: Payer: Self-pay | Admitting: Cardiology

## 2020-06-11 VITALS — BP 136/82 | HR 52 | Ht 65.0 in | Wt 232.0 lb

## 2020-06-11 DIAGNOSIS — E782 Mixed hyperlipidemia: Secondary | ICD-10-CM | POA: Diagnosis not present

## 2020-06-11 DIAGNOSIS — I25119 Atherosclerotic heart disease of native coronary artery with unspecified angina pectoris: Secondary | ICD-10-CM

## 2020-06-11 MED ORDER — NITROGLYCERIN 0.4 MG SL SUBL: 0.4000 mg | SUBLINGUAL_TABLET | SUBLINGUAL | 3 refills | Status: AC | PRN

## 2020-06-11 NOTE — Patient Instructions (Addendum)
Medication Instructions:  Your physician recommends that you continue on your current medications as directed. Please refer to the Current Medication list given to you today.   Take nitroglycerin as directed   *If you need a refill on your cardiac medications before your next appointment, please call your pharmacy*   Lab Work:  Fasting LIPIDS  JUST BEFORE NEXT VISIT IN 3 MONTHS    If you have labs (blood work) drawn today and your tests are completely normal, you will receive your results only by: Marland Kitchen MyChart Message (if you have MyChart) OR . A paper copy in the mail If you have any lab test that is abnormal or we need to change your treatment, we will call you to review the results.   Testing/Procedures: None today   Follow-Up: At [facility], you and your health needs are our priority.  As part of our continuing mission to provide you with exceptional heart care, we have created designated Provider Care Teams.  These Care Teams include your primary Cardiologist (physician) and Advanced Practice Providers (APPs -  Physician Assistants and Nurse Practitioners) who all work together to provide you with the care you need, when you need it.  We recommend signing up for the patient portal called "MyChart".  Sign up information is provided on this After Visit Summary.  MyChart is used to connect with patients for Virtual Visits (Telemedicine).  Patients are able to view lab/test results, encounter notes, upcoming appointments, etc.  Non-urgent messages can be sent to your provider as well.   To learn more about what you can do with MyChart, go to [email or web address].    Your next appointment:   3 month(s)  The format for your next appointment:   In Person  Provider:   Rozann Lesches, MD   Other Instructions None    Nitroglycerin sublingual tablets What is this medicine? NITROGLYCERIN (nye troe GLI ser in) is a type of vasodilator. It relaxes blood vessels, increasing  the blood and oxygen supply to your heart. This medicine is used to relieve chest pain caused by angina. It is also used to prevent chest pain before activities like climbing stairs, going outdoors in cold weather, or sexual activity. This medicine may be used for other purposes; ask your health care provider or pharmacist if you have questions. COMMON BRAND NAME(S): Nitroquick, Nitrostat, Nitrotab What should I tell my health care provider before I take this medicine? They need to know if you have any of these conditions:  anemia  head injury, recent stroke, or bleeding in the brain  liver disease  previous heart attack  an unusual or allergic reaction to nitroglycerin, other medicines, foods, dyes, or preservatives  pregnant or trying to get pregnant  breast-feeding How should I use this medicine? Take this medicine by mouth as needed. Use at the first sign of an angina attack (chest pain or tightness). You can also take this medicine 5 to 10 minutes before an event likely to produce chest pain. Follow the directions exactly as written on the prescription label. Place one tablet under your tongue and let it dissolve. Do not swallow whole. Replace the dose if you accidentally swallow it. It will help if your mouth is not dry. Saliva around the tablet will help it to dissolve more quickly. Do not eat or drink, smoke or chew tobacco while a tablet is dissolving. Sit down when taking this medicine. In an angina attack, you should feel better within 5 minutes after your first  dose. You can take a dose every 5 minutes up to a total of 3 doses. If you do not feel better or feel worse after 1 dose, call 9-1-1 at once. Do not take more than 3 doses in 15 minutes. Your health care provider might give you other directions. Follow those directions if he or she does. Do not take your medicine more often than directed. Talk to your health care provider about the use of this medicine in children. Special  care may be needed. Overdosage: If you think you have taken too much of this medicine contact a poison control center or emergency room at once. NOTE: This medicine is only for you. Do not share this medicine with others. What if I miss a dose? This does not apply. This medicine is only used as needed. What may interact with this medicine? Do not take this medicine with any of the following medications:  certain migraine medicines like ergotamine and dihydroergotamine (DHE)  medicines used to treat erectile dysfunction like sildenafil, tadalafil, and vardenafil  riociguat This medicine may also interact with the following medications:  alteplase  aspirin  heparin  medicines for high blood pressure  medicines for mental depression  other medicines used to treat angina  phenothiazines like chlorpromazine, mesoridazine, prochlorperazine, thioridazine This list may not describe all possible interactions. Give your health care provider a list of all the medicines, herbs, non-prescription drugs, or dietary supplements you use. Also tell them if you smoke, drink alcohol, or use illegal drugs. Some items may interact with your medicine. What should I watch for while using this medicine? Tell your doctor or health care professional if you feel your medicine is no longer working. Keep this medicine with you at all times. Sit or lie down when you take your medicine to prevent falling if you feel dizzy or faint after using it. Try to remain calm. This will help you to feel better faster. If you feel dizzy, take several deep breaths and lie down with your feet propped up, or bend forward with your head resting between your knees. You may get drowsy or dizzy. Do not drive, use machinery, or do anything that needs mental alertness until you know how this drug affects you. Do not stand or sit up quickly, especially if you are an older patient. This reduces the risk of dizzy or fainting spells.  Alcohol can make you more drowsy and dizzy. Avoid alcoholic drinks. Do not treat yourself for coughs, colds, or pain while you are taking this medicine without asking your doctor or health care professional for advice. Some ingredients may increase your blood pressure. What side effects may I notice from receiving this medicine? Side effects that you should report to your doctor or health care professional as soon as possible:  allergic reactions (skin rash, itching or hives; swelling of the face, lips, or tongue)  low blood pressure (dizziness; feeling faint or lightheaded, falls; unusually weak or tired)  low red blood cell counts (trouble breathing; feeling faint; lightheaded, falls; unusually weak or tired) Side effects that usually do not require medical attention (report to your doctor or health care professional if they continue or are bothersome):  facial flushing (redness)  headache  nausea, vomiting This list may not describe all possible side effects. Call your doctor for medical advice about side effects. You may report side effects to FDA at 1-800-FDA-1088. Where should I keep my medicine? Keep out of the reach of children. Store at room temperature  between 20 and 25 degrees C (68 and 77 degrees F). Store in [facility]. Protect from light and moisture. Keep tightly closed. Throw away any unused medicine after the expiration date. NOTE: This sheet is a summary. It may not cover all possible information. If you have questions about this medicine, talk to your doctor, pharmacist, or health care provider.  2021 Elsevier/Gold Standard (2017-10-25 16:46:32)

## 2020-06-11 NOTE — Progress Notes (Signed)
Cardiology Office Note  Date: 06/11/2020   ID: Natasha, Russell 1960/05/14, MRN [id number]  PCP:  Redmond School, MD  Cardiologist:  Rozann Lesches, MD Electrophysiologist:  None   Chief Complaint  Patient presents with  . Cardiac follow-up    History of Present Illness: [name] is a 60 y.o. female seen in consultation in early April, details of her history are outlined in the prior note.  She presents today for follow-up.    Recent [employer] showed a region of scar and moderate peri-infarct ischemia in the LAD distribution which we are managing medically at this time in light of high risk of contrast induced nephropathy.  I reviewed her medications as outlined below.  She is currently on aspirin, atenolol, Imdur, and fluvastatin (picked to least interact with immunosuppressive medications).  She is tolerating therapy well and fortunately reports improved exertional angina.  Lab work in February showed BUN 23, creatinine 2.8 with GFR 18, and potassium 5.4.  Past Medical History:  Diagnosis Date  . Anemia   . Anxiety   . Ascites   . CKD (chronic kidney disease) stage 4, GFR 15-29 ml/min (HCC)   . Depression   . Enteropathy 07/31/2015   Portal hypertensive enteropathy per capsule study, with stigmata of bleeding.  . Esophageal varices ([place])   . Gastric ulcer 07/15/2015   EGD; no stigmata of bleeding  . GI bleed 07/17/2015  . Gram-negative bacteremia 12/08/2011  . Headache(784.0)   . Heart murmur   . History of cirrhosis   . History of hepatitis C - successfuly treated medically 07/14/2015  . Nephrolithiasis   . Portal vein thrombosis   . Right ureteral stone 12/07/2011  . Status post liver [facility] ([place]) 08/[facility]   [facility]  . 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 Type 2 diabetes mellitus ([place])     Past  Surgical History:  Procedure Laterality Date  . ABDOMINAL HYSTERECTOMY  2003  . CESAREAN SECTION  1995  . CHOLECYSTECTOMY  1987  . COLONOSCOPY    . COLONOSCOPY  10/21/2011   Procedure: COLONOSCOPY;  Surgeon: Rogene Houston, MD;  [facility];  Service: Endoscopy;  Laterality: N/A;  245   . CYSTOSCOPY W/ RETROGRADES  12/08/2011   Procedure: CYSTOSCOPY WITH RETROGRADE PYELOGRAM;  Surgeon: Marissa Nestle, MD;  Location: [facility];  Service: Urology;  Laterality: Right;  . CYSTOSCOPY WITH STENT PLACEMENT  12/08/2011   Procedure: CYSTOSCOPY WITH STENT PLACEMENT;  Surgeon: Marissa Nestle, MD;  Location: [facility];  Service: Urology;  Laterality: Right;  . DILATION AND CURETTAGE OF UTERUS    . ESOPHAGOGASTRODUODENOSCOPY N/A 02/19/2014   Procedure: ESOPHAGOGASTRODUODENOSCOPY (EGD);  Surgeon: Rogene Houston, MD;  [facility];  Service: Endoscopy;  Laterality: N/A;  100  . ESOPHAGOGASTRODUODENOSCOPY N/A 07/15/2015   Procedure: ESOPHAGOGASTRODUODENOSCOPY (EGD);  Surgeon: Rogene Houston, MD;  [facility];  Service: Endoscopy;  Laterality: N/A;  . ESOPHAGOGASTRODUODENOSCOPY N/A 07/29/2015   Procedure: ESOPHAGOGASTRODUODENOSCOPY (EGD);  Surgeon: Rogene Houston, MD;  [facility];  Service: Endoscopy;  Laterality: N/A;  . GASTRIC BYPASS  ~ 1995  . HERNIA REPAIR  04/14/11   "one in my bellybutton" (11/07/2012)  . INGUINAL HERNIA REPAIR Right    "maybe 2" (11/07/2012)  . KNEE ARTHROSCOPY Right   . LITHOTRIPSY     "  several times" (11/07/2012)  . OPEN REDUCTION INTERNAL FIXATION (ORIF) DISTAL RADIAL FRACTURE Right 11/07/2012   Procedure: OPEN REDUCTION INTERNAL FIXATION (ORIF) RIGHT DISTAL RADIAL FRACTURE;  Surgeon: [name], MD;  Location: [place];  Service: Orthopedics;  Laterality: Right;  . ORIF DISTAL RADIUS FRACTURE Right 11/07/2012  . OTHER SURGICAL HISTORY  08/14/2016   5 organ transplant- liver, stomach, pancreas, small and large intestine  . UPPER  GASTROINTESTINAL ENDOSCOPY      Current Outpatient Medications  Medication Sig Dispense Refill  . aspirin EC 81 MG tablet Take 1 tablet (81 mg total) by mouth daily. Swallow whole. 90 tablet 3  . atenolol (TENORMIN) 25 MG tablet Take 25 mg by mouth 2 (two) times daily.    Marland Kitchen buPROPion (WELLBUTRIN) 100 MG tablet Take 100 mg by mouth 2 (two) times daily.    . calcium-vitamin D (OSCAL WITH D) 500-200 MG-UNIT tablet Take 2 tablets by mouth 2 (two) times daily.    . cycloSPORINE (SANDIMMUNE) 100 MG capsule Take 100 mg by mouth daily.    . cycloSPORINE (SANDIMMUNE) 25 MG capsule Take 25 mg by mouth 2 (two) times daily.    . cycloSPORINE modified (NEORAL) 100 MG capsule Take 150 mg by mouth 2 (two) times daily. Gengraf  300mg  every morning and 200mg  every night    . FLUoxetine (PROZAC) 10 MG tablet Take 10 mg by mouth daily.    . fluvastatin (LESCOL) 20 MG capsule Take 1 capsule (20 mg total) by mouth at bedtime. 90 capsule 2  . isosorbide mononitrate (IMDUR) 30 MG 24 hr tablet Take 1 tablet (30 mg total) by mouth every evening. 90 tablet 3  . methylphenidate (RITALIN) 10 MG tablet Take 20 mg by mouth daily.    . Multiple Vitamins-Minerals (SUPER THERA VITE M PO) Take 1 tablet by mouth daily.    . mycophenolate (CELLCEPT) 250 MG capsule Take 750 mg by mouth every 12 (twelve) hours.     . nitroGLYCERIN (NITROSTAT) 0.4 MG SL tablet Place 1 tablet (0.4 mg total) under the tongue every 5 (five) minutes as needed. 25 tablet 3  . oxyCODONE (OXYCONTIN) 10 mg 12 hr tablet Take 10 mg by mouth every 12 (twelve) hours.    . pregabalin (LYRICA) 25 MG capsule Take 25-50 mg by mouth 2 (two) times daily. Take 25 mg in the morning and 50 mg in the evening.    Marland Kitchen QUEtiapine (SEROQUEL) 100 MG tablet Take 100 mg by mouth at bedtime.    Marland Kitchen QUEtiapine (SEROQUEL) 50 MG tablet Take 25 mg by mouth every 8 (eight) hours as needed.     . sodium bicarbonate 650 MG tablet Take 650 mg by mouth 2 (two) times daily.    . Vitamin D,  Ergocalciferol, (DRISDOL) 50000 units CAPS capsule Take 50,000 Units by mouth every 7 (seven) days. Take on Friday.     No current facility-administered medications for this visit.   Allergies:  Ancef [cefazolin sodium], Tapentadol, Gadobenate, Iodinated diagnostic agents, and Tape   ROS: No palpitations or syncope.  Physical Exam: VS:  BP 136/82   Pulse (!) 52   Ht 5\' 5"  (1.651 m)   Wt 232 lb (105.2 kg)   SpO2 97%   BMI 38.61 kg/m , BMI Body mass index is 38.61 kg/m.  Wt Readings from Last 3 Encounters:  06/11/20 232 lb (105.2 kg)  05/14/20 232 lb (105.2 kg)  04/16/17 182 lb (82.6 kg)    General: Patient appears comfortable at rest.  HEENT: Conjunctiva and lids normal, wearing a mask. Neck: Supple, no elevated JVP or carotid bruits, no thyromegaly. Lungs: Clear to auscultation, nonlabored breathing at rest. Cardiac: Regular rate and rhythm, no S3 or significant systolic murmur. Extremities: No pitting edema.  ECG:  An ECG dated 05/08/2020 was personally reviewed today and demonstrated:  Normal sinus rhythm.  Recent Labwork:  October 2021: Hemoglobin 11.5, platelets 390, BUN 25, creatinine 1.75, potassium 5.5, AST 14, ALT 13, cholesterol 214, triglycerides 127, HDL 43, LDL 148, TSH 2.31 March 2020: Potassium 5.4, BUN 23, creatinine 2.8, hemoglobin 11.1, platelets 371  Other Studies Reviewed Today:  Lexiscan Myoview 05/25/2020:  There was no ST segment deviation noted during stress.  Findings consistent with prior mid to distal anterior/apical myocardial infarction with moderate peri-infarct ischemia.  This is an intermediate risk study.  The left ventricular ejection fraction is hyperdynamic (>65%).  Assessment and Plan:  1.  Exertional angina with evidence of underlying ischemic heart disease, Lexiscan Myoview showing evidence of LAD distribution scar with moderate peri-infarct ischemia and LVEF greater than 65%.  We are managing her medically at this point given  high risk of contrast nephropathy were we to pursue a cardiac catheterization.  Last creatinine 2.8 with GFR 18.  Fortunately, she reports symptom improvement with medication adjustments.  Currently on aspirin, atenolol, Imdur, and fluvastatin.  Prescription provided for as needed nitroglycerin.  2.  CKD stage IV.  3.  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  She continues to follow regularly on immunosuppressive therapy.  Medication Adjustments/Labs and Tests Ordered: Current medicines are reviewed at length with the patient today.  Concerns regarding medicines are outlined above.   Tests Ordered: Orders Placed This Encounter  Procedures  . Lipid panel    Medication Changes: Meds ordered this encounter  Medications  . nitroGLYCERIN (NITROSTAT) 0.4 MG SL tablet    Sig: Place 1 tablet (0.4 mg total) under the tongue every 5 (five) minutes as needed.    Dispense:  25 tablet    Refill:  3    Disposition:  Follow up 3 months.  Signed, Satira Sark, MD, [facility] 06/11/2020 2:35 PM    [facility] at [facility] [address] [place], [place] [address] Phone: [phone]; Fax: [phone]

## 2020-06-16 DIAGNOSIS — Z9482 Intestine transplant status: Secondary | ICD-10-CM | POA: Diagnosis not present

## 2020-06-16 DIAGNOSIS — D849 Immunodeficiency, unspecified: Secondary | ICD-10-CM | POA: Diagnosis not present

## 2020-06-18 DIAGNOSIS — I2510 Atherosclerotic heart disease of native coronary artery without angina pectoris: Secondary | ICD-10-CM | POA: Diagnosis not present

## 2020-06-18 DIAGNOSIS — N189 Chronic kidney disease, unspecified: Secondary | ICD-10-CM | POA: Diagnosis not present

## 2020-06-18 DIAGNOSIS — Z4823 Encounter for aftercare following liver transplant: Secondary | ICD-10-CM | POA: Diagnosis not present

## 2020-06-18 DIAGNOSIS — Z9483 Pancreas transplant status: Secondary | ICD-10-CM | POA: Diagnosis not present

## 2020-06-18 DIAGNOSIS — Z9482 Intestine transplant status: Secondary | ICD-10-CM | POA: Diagnosis not present

## 2020-06-18 DIAGNOSIS — Z79899 Other long term (current) drug therapy: Secondary | ICD-10-CM | POA: Diagnosis not present

## 2020-06-18 DIAGNOSIS — Z944 Liver transplant status: Secondary | ICD-10-CM | POA: Diagnosis not present

## 2020-06-18 DIAGNOSIS — N1832 Chronic kidney disease, stage 3b: Secondary | ICD-10-CM | POA: Diagnosis not present

## 2020-06-18 DIAGNOSIS — F32A Depression, unspecified: Secondary | ICD-10-CM | POA: Diagnosis not present

## 2020-06-18 DIAGNOSIS — Z87891 Personal history of nicotine dependence: Secondary | ICD-10-CM | POA: Diagnosis not present

## 2020-06-18 DIAGNOSIS — D84821 Immunodeficiency due to drugs: Secondary | ICD-10-CM | POA: Diagnosis not present

## 2020-06-18 DIAGNOSIS — I10 Essential (primary) hypertension: Secondary | ICD-10-CM | POA: Diagnosis not present

## 2020-06-18 DIAGNOSIS — I951 Orthostatic hypotension: Secondary | ICD-10-CM | POA: Diagnosis not present

## 2020-06-18 DIAGNOSIS — D638 Anemia in other chronic diseases classified elsewhere: Secondary | ICD-10-CM | POA: Diagnosis not present

## 2020-06-19 DIAGNOSIS — G4709 Other insomnia: Secondary | ICD-10-CM | POA: Diagnosis not present

## 2020-06-19 DIAGNOSIS — F330 Major depressive disorder, recurrent, mild: Secondary | ICD-10-CM | POA: Diagnosis not present

## 2020-06-19 DIAGNOSIS — R5383 Other fatigue: Secondary | ICD-10-CM | POA: Diagnosis not present

## 2020-06-19 DIAGNOSIS — R6889 Other general symptoms and signs: Secondary | ICD-10-CM | POA: Diagnosis not present

## 2020-06-23 ENCOUNTER — Ambulatory Visit: Payer: Medicare Other | Admitting: Cardiology

## 2020-06-23 DIAGNOSIS — G894 Chronic pain syndrome: Secondary | ICD-10-CM | POA: Diagnosis not present

## 2020-06-23 DIAGNOSIS — I209 Angina pectoris, unspecified: Secondary | ICD-10-CM | POA: Diagnosis not present

## 2020-06-23 DIAGNOSIS — E119 Type 2 diabetes mellitus without complications: Secondary | ICD-10-CM | POA: Diagnosis not present

## 2020-07-22 DIAGNOSIS — L03311 Cellulitis of abdominal wall: Secondary | ICD-10-CM | POA: Diagnosis not present

## 2020-07-22 DIAGNOSIS — K7460 Unspecified cirrhosis of liver: Secondary | ICD-10-CM | POA: Diagnosis not present

## 2020-07-22 DIAGNOSIS — Z6838 Body mass index (BMI) 38.0-38.9, adult: Secondary | ICD-10-CM | POA: Diagnosis not present

## 2020-08-03 DIAGNOSIS — G4709 Other insomnia: Secondary | ICD-10-CM | POA: Diagnosis not present

## 2020-08-03 DIAGNOSIS — F330 Major depressive disorder, recurrent, mild: Secondary | ICD-10-CM | POA: Diagnosis not present

## 2020-08-03 DIAGNOSIS — R6889 Other general symptoms and signs: Secondary | ICD-10-CM | POA: Diagnosis not present

## 2020-08-27 DIAGNOSIS — E782 Mixed hyperlipidemia: Secondary | ICD-10-CM | POA: Diagnosis not present

## 2020-08-27 DIAGNOSIS — Z79899 Other long term (current) drug therapy: Secondary | ICD-10-CM | POA: Diagnosis not present

## 2020-08-27 DIAGNOSIS — Z9482 Intestine transplant status: Secondary | ICD-10-CM | POA: Diagnosis not present

## 2020-08-27 DIAGNOSIS — D849 Immunodeficiency, unspecified: Secondary | ICD-10-CM | POA: Diagnosis not present

## 2020-08-28 LAB — HEPATIC FUNCTION PANEL
ALT: 11 IU/L (ref 0–32)
AST: 11 IU/L (ref 0–40)
Albumin: 4 g/dL (ref 3.8–4.9)
Alkaline Phosphatase: 145 IU/L — ABNORMAL HIGH (ref 44–121)
Bilirubin Total: 0.3 mg/dL (ref 0.0–1.2)
Bilirubin, Direct: 0.14 mg/dL (ref 0.00–0.40)
Total Protein: 6.4 g/dL (ref 6.0–8.5)

## 2020-08-28 LAB — LIPID PANEL
Chol/HDL Ratio: 3.5 ratio (ref 0.0–4.4)
Cholesterol, Total: 136 mg/dL (ref 100–199)
HDL: 39 mg/dL — ABNORMAL LOW (ref >39)
LDL Chol Calc (NIH): 78 mg/dL (ref 0–99)
Triglycerides: 101 mg/dL (ref 0–149)
VLDL Cholesterol Cal: 19 mg/dL (ref 5–40)

## 2020-09-03 DIAGNOSIS — Z79899 Other long term (current) drug therapy: Secondary | ICD-10-CM | POA: Diagnosis not present

## 2020-09-03 DIAGNOSIS — Z79891 Long term (current) use of opiate analgesic: Secondary | ICD-10-CM | POA: Diagnosis not present

## 2020-09-03 DIAGNOSIS — E875 Hyperkalemia: Secondary | ICD-10-CM | POA: Diagnosis not present

## 2020-09-03 DIAGNOSIS — R5383 Other fatigue: Secondary | ICD-10-CM | POA: Diagnosis not present

## 2020-09-03 DIAGNOSIS — D649 Anemia, unspecified: Secondary | ICD-10-CM | POA: Diagnosis not present

## 2020-09-03 DIAGNOSIS — E669 Obesity, unspecified: Secondary | ICD-10-CM | POA: Diagnosis not present

## 2020-09-16 DIAGNOSIS — D849 Immunodeficiency, unspecified: Secondary | ICD-10-CM | POA: Diagnosis not present

## 2020-09-16 DIAGNOSIS — Z9482 Intestine transplant status: Secondary | ICD-10-CM | POA: Diagnosis not present

## 2020-09-18 DIAGNOSIS — E875 Hyperkalemia: Secondary | ICD-10-CM | POA: Diagnosis not present

## 2020-09-18 DIAGNOSIS — Z79899 Other long term (current) drug therapy: Secondary | ICD-10-CM | POA: Diagnosis not present

## 2020-09-18 DIAGNOSIS — R809 Proteinuria, unspecified: Secondary | ICD-10-CM | POA: Diagnosis not present

## 2020-09-18 DIAGNOSIS — N189 Chronic kidney disease, unspecified: Secondary | ICD-10-CM | POA: Diagnosis not present

## 2020-09-22 ENCOUNTER — Encounter: Payer: Self-pay | Admitting: Cardiology

## 2020-09-22 ENCOUNTER — Other Ambulatory Visit: Payer: Self-pay

## 2020-09-22 ENCOUNTER — Ambulatory Visit: Payer: Medicare Other | Admitting: Cardiology

## 2020-09-22 VITALS — BP 116/80 | HR 60 | Ht 66.0 in | Wt 228.0 lb

## 2020-09-22 DIAGNOSIS — N184 Chronic kidney disease, stage 4 (severe): Secondary | ICD-10-CM

## 2020-09-22 DIAGNOSIS — I25119 Atherosclerotic heart disease of native coronary artery with unspecified angina pectoris: Secondary | ICD-10-CM

## 2020-09-22 MED ORDER — ISOSORBIDE MONONITRATE ER 30 MG PO TB24: ORAL_TABLET | ORAL | 3 refills | Status: DC

## 2020-09-22 NOTE — Progress Notes (Signed)
Cardiology Office Note  Date: 09/22/2020   ID: Natasha, Russell 1960-04-14, MRN [id number]  PCP:  Redmond School, MD  Cardiologist:  Rozann Lesches, MD Electrophysiologist:  None   Chief Complaint  Patient presents with   Cardiac follow-up    History of Present Illness: [name] is a 60 y.o. female last seen in May.  She is here for a follow-up visit.  Despite consistent medical therapy, she states that she has had progressive exertional fatigue and lack of stamina, also recent intermittent chest tightness.  We have been managing her for ischemic heart disease based on abnormal Lexiscan Myoview showing moderate peri-infarct ischemia in the LAD distribution and LVEF greater than 65%.  We have not pursued cardiac catheterization given history of CKD stage IV and prior multivisceral transplant including liver, pancreas, and small bowel in July 2018 at [name] on immunosuppressant therapy.  We went over her medications and discussed up titration of Imdur.  I would also like to have her see Dr. Burt Knack for an interventional cardiology opinion regarding feasibility of PCI with adequate preparation for contrast nephropathy.  Actually, her most recent creatinine was down to 1.96.  Past Medical History:  Diagnosis Date   Anemia    Anxiety    Ascites    CKD (chronic kidney disease) stage 4, GFR 15-29 ml/min (HCC)    Depression    Enteropathy 07/31/2015   Portal hypertensive enteropathy per capsule study, with stigmata of bleeding.   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   Type 2 diabetes mellitus ([place])     Past Surgical History:  Procedure Laterality Date   ABDOMINAL HYSTERECTOMY  2003   [facility]   COLONOSCOPY     COLONOSCOPY  10/21/2011   Procedure: COLONOSCOPY;  Surgeon: Rogene Houston, MD;  [facility];  Service: Endoscopy;  Laterality: N/A;  [place]  12/08/2011   Procedure: CYSTOSCOPY WITH RETROGRADE PYELOGRAM;  Surgeon: Marissa Nestle, MD;  Location: [facility];  Service: Urology;  Laterality: Right;   CYSTOSCOPY WITH STENT PLACEMENT  12/08/2011   Procedure: CYSTOSCOPY WITH STENT PLACEMENT;  Surgeon: Marissa Nestle, MD;  Location: [facility];  Service: Urology;  Laterality: Right;   DILATION AND CURETTAGE OF UTERUS     ESOPHAGOGASTRODUODENOSCOPY N/A 02/19/2014   Procedure: ESOPHAGOGASTRODUODENOSCOPY (EGD);  Surgeon: Rogene Houston, MD;  [facility];  Service: Endoscopy;  Laterality: N/A;  100   ESOPHAGOGASTRODUODENOSCOPY N/A 07/15/2015   Procedure: ESOPHAGOGASTRODUODENOSCOPY (EGD);  Surgeon: Rogene Houston, MD;  [facility];  Service: Endoscopy;  Laterality: N/A;   ESOPHAGOGASTRODUODENOSCOPY N/A 07/29/2015   Procedure: ESOPHAGOGASTRODUODENOSCOPY (EGD);  Surgeon: Rogene Houston, MD;  [facility];  Service: Endoscopy;  Laterality: N/A;   GASTRIC BYPASS  ~ [name]  04/14/11   "one in my bellybutton" (11/07/2012)   INGUINAL HERNIA REPAIR Right    "maybe 2" (11/07/2012)   KNEE ARTHROSCOPY Right    LITHOTRIPSY     "several times" (11/07/2012)   OPEN REDUCTION INTERNAL FIXATION (ORIF) DISTAL RADIAL FRACTURE Right 11/07/2012   Procedure: OPEN REDUCTION INTERNAL FIXATION (ORIF) RIGHT DISTAL RADIAL FRACTURE;  Surgeon: [name], MD;  Location: [place];  Service: Orthopedics;  Laterality: Right;   ORIF  DISTAL RADIUS FRACTURE Right 11/07/2012   OTHER SURGICAL HISTORY  08/14/2016   5 organ transplant- liver, stomach, pancreas, small and large intestine   UPPER GASTROINTESTINAL ENDOSCOPY      Current Outpatient Medications  Medication Sig Dispense Refill   aspirin EC 81 MG tablet Take 1 tablet (81 mg total) by mouth daily. Swallow whole. 90 tablet 3   atenolol (TENORMIN) 25 MG tablet Take 25 mg by mouth 2 (two) times daily.     calcium-vitamin D (OSCAL WITH D) 500-200 MG-UNIT tablet Take 2 tablets by mouth 2 (two) times daily.     cycloSPORINE (SANDIMMUNE) 100 MG capsule Take 100 mg by mouth daily.     cycloSPORINE (SANDIMMUNE) 25 MG capsule Take 25 mg by mouth 2 (two) times daily.     fluvastatin (LESCOL) 20 MG capsule Take 1 capsule (20 mg total) by mouth at bedtime. 90 capsule 2   furosemide (LASIX) 20 MG tablet Take by mouth.     isosorbide mononitrate (IMDUR) 30 MG 24 hr tablet Take daily 15 mg am and 30 mg at bedtime 135 tablet 3   Multiple Vitamins-Minerals (SUPER THERA VITE M PO) Take 1 tablet by mouth daily.     mycophenolate (CELLCEPT) 250 MG capsule Take 750 mg by mouth every 12 (twelve) hours.      nitroGLYCERIN (NITROSTAT) 0.4 MG SL tablet Place 1 tablet (0.4 mg total) under the tongue every 5 (five) minutes as needed. 25 tablet 3   oxyCODONE (OXYCONTIN) 10 mg 12 hr tablet Take 10 mg by mouth every 12 (twelve) hours.     pantoprazole (PROTONIX) 40 MG tablet Take by mouth.     traZODone (DESYREL) 50 MG tablet Take 50-150 mg by mouth at bedtime as needed.     Vitamin D, Ergocalciferol, (DRISDOL) 50000 units CAPS capsule Take 50,000 Units by mouth every 7 (seven) days. Take on Friday.     buPROPion (WELLBUTRIN) 100 MG tablet Take 100 mg by mouth 2 (two) times daily. (Patient not taking: Reported on 09/22/2020)     cycloSPORINE modified (NEORAL) 100 MG capsule Take 150 mg by mouth 2 (two) times daily. Gengraf  300mg  every morning and 200mg  every night (Patient not taking: Reported on  09/22/2020)     FLUoxetine (PROZAC) 10 MG tablet Take 10 mg by mouth daily. (Patient not taking: Reported on 09/22/2020)     methylphenidate (RITALIN) 10 MG tablet Take 20 mg by mouth daily. (Patient not taking: Reported on 09/22/2020)     pregabalin (LYRICA) 25 MG capsule Take 25-50 mg by mouth 2 (two) times daily. Take 25 mg in the morning and 50 mg in the evening. (Patient not taking: Reported on 09/22/2020)     QUEtiapine (SEROQUEL) 100 MG tablet Take 100 mg by mouth at bedtime. (Patient not taking: Reported on 09/22/2020)     QUEtiapine (SEROQUEL) 50 MG tablet Take 25 mg by mouth every 8 (eight) hours as needed.  (Patient not taking: Reported on 09/22/2020)     sodium bicarbonate 650 MG tablet Take  650 mg by mouth 2 (two) times daily. (Patient not taking: Reported on 09/22/2020)     No current facility-administered medications for this visit.   Allergies:  Ancef [cefazolin sodium], Tapentadol, Gadobenate, Iodinated diagnostic agents, and Tape   ROS: No palpitations or syncope.  Physical Exam: VS:  BP 116/80   Pulse 60   Ht 5\' 6"  (1.676 m)   Wt 228 lb (103.4 kg)   SpO2 97%   BMI 36.80 kg/m , BMI Body mass index is 36.8 kg/m.  Wt Readings from Last 3 Encounters:  09/22/20 228 lb (103.4 kg)  06/11/20 232 lb (105.2 kg)  05/14/20 232 lb (105.2 kg)    General: Patient appears comfortable at rest. HEENT: Conjunctiva and lids normal, wearing a mask. Neck: Supple, no elevated JVP or carotid bruits, no thyromegaly. Lungs: Clear to auscultation, nonlabored breathing at rest. Cardiac: Regular rate and rhythm, no S3 or significant systolic murmur. Extremities: No pitting edema.  ECG:  An ECG dated 05/08/2020 was personally reviewed today and demonstrated:  Sinus rhythm.  Recent Labwork: 08/27/2020: ALT 11; AST 11     Component Value Date/Time   CHOL 136 08/27/2020 0849   TRIG 101 08/27/2020 0849   HDL 39 (L) 08/27/2020 0849   CHOLHDL 3.5 08/27/2020 0849   LDLCALC 78 08/27/2020 0849   August 2022: Hemoglobin 11.5, platelets 305, potassium 5.4, BUN 18, creatinine 1.96  Other Studies Reviewed Today:  Lexiscan Myoview 05/25/2020: There was no ST segment deviation noted during stress. Findings consistent with prior mid to distal anterior/apical myocardial infarction with moderate peri-infarct ischemia. This is an intermediate risk study. The left ventricular ejection fraction is hyperdynamic (>65%).  Assessment and Plan:  1.  Ischemic heart disease with LAD distribution scar and moderate peri-infarct ischemia by Leane Call in April.  She has been managed medically with concern for contrast nephropathy in light of history of CKD stage IV.  Most recent creatinine was down to 1.96 based on review of outside lab work.  She is reporting progressive angina and lack of stamina with activity despite medical therapy.  We will increase Imdur to 15 mg in the morning and 30 mg in the evening.  I will also refer her to see Dr. Burt Knack regarding feasibility of PCI with adequate contrast nephropathy precautions.  Otherwise continue aspirin, atenolol, and Lescol (selected to reduce interaction with her immunosuppressant regimen).  2.  CKD stage IV by history, most recent creatinine down to 1.96.  3.  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  She is on immunosuppressant therapy.  Medication Adjustments/Labs and Tests Ordered: Current medicines are reviewed at length with the patient today.  Concerns regarding medicines are outlined above.   Tests Ordered: Orders Placed This Encounter  Procedures   Ambulatory referral to Cardiology     Medication Changes: Meds ordered this encounter  Medications   isosorbide mononitrate (IMDUR) 30 MG 24 hr tablet    Sig: Take daily 15 mg am and 30 mg at bedtime    Dispense:  135 tablet    Refill:  3    09/22/20 Increased to 15 mg am and 30 mg hs     Disposition:  Follow up  3  months.  Signed, Satira Sark, MD, [facility] 09/22/2020 2:35 PM    [facility] at [facility] [address] [place], [place] [address] Phone: [phone]; Fax: [phone]

## 2020-09-22 NOTE — Patient Instructions (Signed)
Medication Instructions:   INCREASE Imdur to 15 mg in the morning and 30 mg at bedtime  *If you need a refill on your cardiac medications before your next appointment, please call your pharmacy*   Lab Work: None today  If you have labs (blood work) drawn today and your tests are completely normal, you will receive your results only by: [place] (if you have MyChart) OR A paper copy in the mail If you have any lab test that is abnormal or we need to change your treatment, we will call you to review the results.   Testing/Procedures: None today    Follow-Up: At [facility], you and your health needs are our priority.  As part of our continuing mission to provide you with exceptional heart care, we have created designated Provider Care Teams.  These Care Teams include your primary Cardiologist (physician) and Advanced Practice Providers (APPs -  Physician Assistants and Nurse Practitioners) who all work together to provide you with the care you need, when you need it.  We recommend signing up for the patient portal called "MyChart".  Sign up information is provided on this After Visit Summary.  MyChart is used to connect with patients for Virtual Visits (Telemedicine).  Patients are able to view lab/test results, encounter notes, upcoming appointments, etc.  Non-urgent messages can be sent to your provider as well.   To learn more about what you can do with MyChart, go to [email or web address].    Your next appointment:   3 month(s)  The format for your next appointment:   In Person  Provider:   Rozann Lesches, MD   Other Instructions  Please schedule an appointment with Dr.Cooper in our [place], [address] office.

## 2020-09-24 ENCOUNTER — Ambulatory Visit: Payer: Medicare Other | Admitting: Cardiovascular Disease

## 2020-09-24 ENCOUNTER — Encounter: Payer: Self-pay | Admitting: Cardiovascular Disease

## 2020-09-24 ENCOUNTER — Other Ambulatory Visit: Payer: Self-pay

## 2020-09-24 VITALS — BP 110/58 | HR 61 | Ht 65.0 in | Wt 226.4 lb

## 2020-09-24 DIAGNOSIS — R079 Chest pain, unspecified: Secondary | ICD-10-CM

## 2020-09-24 DIAGNOSIS — G4730 Sleep apnea, unspecified: Secondary | ICD-10-CM | POA: Diagnosis not present

## 2020-09-24 DIAGNOSIS — I25119 Atherosclerotic heart disease of native coronary artery with unspecified angina pectoris: Secondary | ICD-10-CM

## 2020-09-24 MED ORDER — DIPHENHYDRAMINE HCL 50 MG PO TABS: ORAL_TABLET | ORAL | 0 refills | Status: DC

## 2020-09-24 MED ORDER — PREDNISONE 50 MG PO TABS: ORAL_TABLET | ORAL | 0 refills | Status: DC

## 2020-09-24 NOTE — H&P (View-Only) (Signed)
Cardiology Office Note:    Date:  09/25/2020   ID:  [name], DOB 11/01/1960, MRN [id number]  PCP:  Redmond School, MD   [facility] Providers Cardiologist:  Rozann Lesches, MD     Referring MD: Redmond School, MD   Chief Complaint  Patient presents with   Chest Pain    History of Present Illness:    [name] is a 60 y.o. female with a hx of multivisceral transplant in 2018 at [facility] with transplant of the liver, pancreas, stomach, and small bowel at that time.  She is referred by Dr. Domenic Polite for discussion of cardiac catheterization in the setting of progressive anginal symptoms, and abnormal Myoview scan demonstrating anteroapical ischemia, and stage IV chronic kidney disease at risk for contrast-induced nephropathy with invasive angiography.  She did well for a few years following transplantation, but over the past year she has developed severe limitation because of progressive fatigue, weakness, and chest discomfort. She states that she has severe symptoms with low level activity such as taking a shower or brushing her hair. With these activities she develops tightness in the center of her chest into the upper back and neck. Symptoms improve within a few minutes of rest. She has also experienced the same symptoms at rest.  Medical therapy has been escalated without improvement in symptoms.  Work-up to date has included an echocardiogram which demonstrated normal LV and RV function with no significant valvular disease.  A Myoview scan demonstrated anteroapical myocardial infarction with moderate peri-infarct ischemia and normal LV systolic function.    Past Medical History:  Diagnosis Date   Anemia    Anxiety    Ascites    CKD (chronic kidney disease) stage 4, GFR 15-29 ml/min (HCC)    Depression    Enteropathy 07/31/2015   Portal hypertensive enteropathy per capsule study, with stigmata of bleeding.   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   Type 2 diabetes mellitus ([place])     Past Surgical History:  Procedure Laterality Date   ABDOMINAL HYSTERECTOMY  2003   [place]   COLONOSCOPY     COLONOSCOPY  10/21/2011   Procedure: COLONOSCOPY;  Surgeon: Rogene Houston, MD;  [facility];  Service: Endoscopy;  Laterality: N/A;  [place]  12/08/2011   Procedure: CYSTOSCOPY WITH RETROGRADE PYELOGRAM;  Surgeon: Marissa Nestle, MD;  Location: [facility] ORS;  Service: Urology;  Laterality: Right;   CYSTOSCOPY WITH STENT PLACEMENT  12/08/2011   Procedure: CYSTOSCOPY WITH STENT PLACEMENT;  Surgeon: Marissa Nestle, MD;  Location: [facility];  Service: Urology;  Laterality: Right;   DILATION AND CURETTAGE OF UTERUS     ESOPHAGOGASTRODUODENOSCOPY N/A 02/19/2014   Procedure: ESOPHAGOGASTRODUODENOSCOPY (EGD);  Surgeon: Rogene Houston, MD;  [facility];  Service: Endoscopy;  Laterality:  N/A;  100   ESOPHAGOGASTRODUODENOSCOPY N/A 07/15/2015   Procedure: ESOPHAGOGASTRODUODENOSCOPY (EGD);  Surgeon: Rogene Houston, MD;  [facility];  Service: Endoscopy;  Laterality: N/A;   ESOPHAGOGASTRODUODENOSCOPY N/A 07/29/2015   Procedure: ESOPHAGOGASTRODUODENOSCOPY (EGD);  Surgeon: Rogene Houston, MD;  [facility];  Service: Endoscopy;  Laterality: N/A;   GASTRIC BYPASS  ~ [place]  04/14/11   "one in my bellybutton" (11/07/2012)    INGUINAL HERNIA REPAIR Right    "maybe 2" (11/07/2012)   KNEE ARTHROSCOPY Right    LITHOTRIPSY     "several times" (11/07/2012)   OPEN REDUCTION INTERNAL FIXATION (ORIF) DISTAL RADIAL FRACTURE Right 11/07/2012   Procedure: OPEN REDUCTION INTERNAL FIXATION (ORIF) RIGHT DISTAL RADIAL FRACTURE;  Surgeon: [name], MD;  Location: [place];  Service: Orthopedics;  Laterality: Right;   ORIF DISTAL RADIUS FRACTURE Right 11/07/2012   OTHER SURGICAL HISTORY  08/14/2016   5 organ transplant- liver, stomach, pancreas, small and large intestine   UPPER GASTROINTESTINAL ENDOSCOPY      Current Medications: Current Meds  Medication Sig   aspirin EC 81 MG tablet Take 1 tablet (81 mg total) by mouth daily. Swallow whole.   atenolol (TENORMIN) 25 MG tablet Take 25 mg by mouth 2 (two) times daily.   calcium-vitamin D (OSCAL WITH D) 500-200 MG-UNIT tablet Take 2 tablets by mouth 2 (two) times daily.   cycloSPORINE (SANDIMMUNE) 100 MG capsule Take 100 mg by mouth daily.   cycloSPORINE (SANDIMMUNE) 25 MG capsule Take 75 mg by mouth at bedtime.   diphenhydrAMINE (BENADRYL) 50 MG tablet Take one tablet by mouth prior to heart cath with prednisone   fluvastatin (LESCOL) 20 MG capsule Take 1 capsule (20 mg total) by mouth at bedtime.   furosemide (LASIX) 20 MG tablet Take 20 mg by mouth every Monday, Wednesday, and Friday.   isosorbide mononitrate (IMDUR) 30 MG 24 hr tablet Take daily 15 mg am and 30 mg at bedtime (Patient taking differently: Take 15-30 mg by mouth See admin instructions. Take daily 15 mg am and 30 mg at bedtime)   Multiple Vitamins-Minerals (SUPER THERA VITE M PO) Take 1 tablet by mouth daily.   mycophenolate (CELLCEPT) 250 MG capsule Take 250-500 mg by mouth See admin instructions. 500 mg in the morning. 250 mg in the evening   nitroGLYCERIN (NITROSTAT) 0.4 MG SL tablet Place 1 tablet (0.4 mg total) under the tongue every 5 (five) minutes as needed.   pantoprazole (PROTONIX) 40 MG tablet Take 40  mg by mouth at bedtime.   predniSONE (DELTASONE) 50 MG tablet Take one tablet by mouth on 09/24/20 at 11:30 pm, 09/25/20 at 5:00 am, and before you leave home   traZODone (DESYREL) 50 MG tablet Take 100 mg by mouth at bedtime.   Vitamin D, Ergocalciferol, (DRISDOL) 50000 units CAPS capsule Take 50,000 Units by mouth every 7 (seven) days. Take on Friday.   [DISCONTINUED] oxyCODONE (OXYCONTIN) 10 mg 12 hr tablet Take 10 mg by mouth every 12 (twelve) hours.   [DISCONTINUED] sodium bicarbonate 650 MG tablet Take 650 mg by mouth 2 (two) times daily.     Allergies:   Ancef [cefazolin sodium], Tapentadol, Gadobenate, Iodinated diagnostic agents, and Tape   Social History   Socioeconomic History   Marital status: Married    Spouse name: Not on file   Number of children: 1   Years of education: 12   Highest education level: Not on file  Occupational History  Not on file  Tobacco Use   Smoking status: Former    Packs/day: 0.50    Years: 5.00    Pack years: 2.50    Types: Cigarettes    Quit date: 05/02/1991    Years since quitting: 29.4   Smokeless tobacco: Never  Vaping Use   Vaping Use: Never used  Substance and Sexual Activity   Alcohol use: No    Alcohol/week: 0.0 standard drinks   Drug use: No   Sexual activity: Not on file  Other Topics Concern   Not on file  Social History Narrative   Patient is single and lives at home and her son lives with her.   Patient is disabled.   Patient has a high school education.   Patient is left-handed.   Patient drinks 24 oz of tea/soda daily.   Social Determinants of Health   Financial Resource Strain: Not on file  Food Insecurity: Not on file  Transportation Needs: Not on file  Physical Activity: Not on file  Stress: Not on file  Social Connections: Not on file     Family History: The patient's family history includes Dementia in her father and mother; Diabetes in her father; Heart disease in her father; Hypertension in her brother  and father; Liver disease in her father.  ROS:   Please see the history of present illness.    All other systems reviewed and are negative.  EKGs/Labs/Other Studies Reviewed:    The following studies were reviewed today: Echo: IMPRESSIONS     1. Left ventricular ejection fraction, by estimation, is 60 to 65%. The  left ventricle has normal function. The left ventricle has no regional  wall motion abnormalities. There is mild left ventricular hypertrophy.  Left ventricular diastolic parameters  were normal. Normal global longitudinal strain of -19.1%.   2. Right ventricular systolic function is normal. The right ventricular  size is normal. There is normal pulmonary artery systolic pressure. The  estimated right ventricular systolic pressure is 59.5 mmHg.   3. Left atrial size was mildly dilated.   4. The mitral valve is grossly normal. Mild mitral valve regurgitation.   5. The aortic valve is tricuspid. Aortic valve regurgitation is not  visualized.   6. The inferior vena cava is normal in size with greater than 50%  respiratory variability, suggesting right atrial pressure of 3 mmHg.   Comparison(s): Echocardiogram done 02/27/17 ay [facility] showed an EF of 56%.   Myoview Scan 05/25/2020: Study Result  Narrative & Impression  There was no ST segment deviation noted during stress. Findings consistent with prior mid to distal anterior/apical myocardial infarction with moderate peri-infarct ischemia. This is an intermediate risk study. The left ventricular ejection fraction is hyperdynamic (>65%).   EKG:  EKG is not ordered today.    Recent Labs: 08/27/2020: ALT 11 09/24/2020: Hemoglobin 10.9; Platelets 294 09/25/2020: BUN 27; Creatinine, Ser 2.56; Potassium 5.0; Sodium 138  Recent Lipid Panel    Component Value Date/Time   CHOL 136 08/27/2020 0849   TRIG 101 08/27/2020 0849   HDL 39 (L) 08/27/2020 0849   CHOLHDL 3.5 08/27/2020 0849   LDLCALC 78 08/27/2020 0849     Risk  Assessment/Calculations:           Physical Exam:    VS:  BP (!) 110/58   Pulse 61   Ht 5\' 5"  (1.651 m)   Wt 226 lb 6.4 oz (102.7 kg)   SpO2 97%   BMI 37.67 kg/m  Wt Readings from Last 3 Encounters:  09/25/20 226 lb (102.5 kg)  09/24/20 226 lb 6.4 oz (102.7 kg)  09/22/20 228 lb (103.4 kg)     GEN:  Well nourished, well developed in no acute distress HEENT: Normal NECK: No JVD; No carotid bruits LYMPHATICS: No lymphadenopathy CARDIAC: RRR, no murmurs, rubs, gallops RESPIRATORY:  Clear to auscultation without rales, wheezing or rhonchi  ABDOMEN: Soft, non-tender, non-distended MUSCULOSKELETAL:  No edema; No deformity  SKIN: Warm and dry NEUROLOGIC:  Alert and oriented x 3 PSYCHIATRIC:  Normal affect   ASSESSMENT:    1. Coronary artery disease involving native coronary artery of native heart with angina pectoris ([place])   2. Recurrent chest pain    PLAN:    In order of problems listed above:  The patient has CCS functional class III-IV symptoms of progressive angina, dyspnea, and fatigue despite aggressive medical therapy.  A Myoview scan demonstrates significant anterior wall infarct with peri-infarct ischemia.  I have a high suspicion that she has critical coronary artery disease.  It is unclear at this point whether she has single-vessel or multivessel disease but based on the severity of her symptoms, I think she is at high risk for multivessel disease or at least a large area of myocardium involved.  While cardiac catheterization will comment increased risk of renal complication, it is clearly indicated in order to prevent further clinical deterioration including significant risk for MI with her progressive symptoms noted.  Will bring her in early for prehydration, avoid all nephrotoxic drugs, and plan on doing limited contrast angiography.  The patient understands that if she requires intervention, it will likely need to be done in a staged fashion in order to minimize  contrast usage. I have reviewed the risks, indications, and alternatives to cardiac catheterization, possible angioplasty, and stenting with the patient. Risks include but are not limited to bleeding, infection, vascular injury, stroke, myocardial infection, arrhythmia, kidney injury, radiation-related injury in the case of prolonged fluoroscopy use, emergency cardiac surgery, and death. The patient understands the risks of serious complication is 1-2 in 6295 with diagnostic cardiac cath and 1-2% or less with angioplasty/stenting.  Again, she understands the risk of kidney injury is substantially higher in the setting of her baseline renal impairment and that all efforts will be made to minimize contrast utilization during her procedure.   Shared Decision Making/Informed Consent The risks [stroke (1 in 1000), death (1 in 1000), kidney failure [usually temporary] (1 in 500), bleeding (1 in 200), allergic reaction [possibly serious] (1 in 200)], benefits (diagnostic support and management of coronary artery disease) and alternatives of a cardiac catheterization were discussed in detail with Ms. Desanto and she is willing to proceed.    Medication Adjustments/Labs and Tests Ordered: Current medicines are reviewed at length with the patient today.  Concerns regarding medicines are outlined above.  Orders Placed This Encounter  Procedures   Basic metabolic panel   CBC with Differential/Platelet   EKG 12-Lead   Meds ordered this encounter  Medications   predniSONE (DELTASONE) 50 MG tablet    Sig: Take one tablet by mouth on 09/24/20 at 11:30 pm, 09/25/20 at 5:00 am, and before you leave home    Dispense:  3 tablet    Refill:  0   diphenhydrAMINE (BENADRYL) 50 MG tablet    Sig: Take one tablet by mouth prior to heart cath with prednisone    Dispense:  1 tablet    Refill:  0    Patient  Instructions  Medication Instructions:  Your physician recommends that you continue on your current medications as  directed. Please refer to the Current Medication list given to you today.  *If you need a refill on your cardiac medications before your next appointment, please call your pharmacy*  Lab Work: Your physician recommends that you have lab work today BMET and CBC  If you have labs (blood work) drawn today and your tests are completely normal, you will receive your results only by: MyChart Message (if you have MyChart) OR A paper copy in the mail If you have any lab test that is abnormal or we need to change your treatment, we will call you to review the results.  Testing/Procedures: Your physician has requested that you have a cardiac catheterization. Cardiac catheterization is used to diagnose and/or treat various heart conditions. Doctors may recommend this procedure for a number of different reasons. The most common reason is to evaluate chest pain. Chest pain can be a symptom of coronary artery disease (CAD), and cardiac catheterization can show whether plaque is narrowing or blocking your heart's arteries. This procedure is also used to evaluate the valves, as well as measure the blood flow and oxygen levels in different parts of your heart. For further information please visit [email or web address].[email or web address]. Please follow instruction sheet, as given.  Follow-Up: At [facility], you and your health needs are our priority.  As part of our continuing mission to provide you with exceptional heart care, we have created designated Provider Care Teams.  These Care Teams include your primary Cardiologist (physician) and Advanced Practice Providers (APPs -  Physician Assistants and Nurse Practitioners) who all work together to provide you with the care you need, when you need it.  We recommend signing up for the patient portal called "MyChart".  Sign up information is provided on this After Visit Summary.  MyChart is used to connect with patients for Virtual Visits (Telemedicine).  Patients are able to view  lab/test results, encounter notes, upcoming appointments, etc.  Non-urgent messages can be sent to your provider as well.   To learn more about what you can do with MyChart, go to [email or web address].    Your next appointment:   2 week(s)  The format for your next appointment:   In Person  Provider:   You may see Rozann Lesches, MD or the following Advanced Practice Provider on your designated Care Team:   Katina Dung, NP   Other [place] OFFICE [place], [place] [address] Dept: [phone] Loc: [place]  09/24/2020  You are scheduled for a Cardiac Catheterization on Friday, August 19 with Dr. Sherren Mocha.  1. Please arrive at the [facility] (Main Entrance A) at [facility]: [address] [place], [place] [address] at 7:30 AM (This time is two hours before your procedure to ensure your preparation). Free valet parking service is available.   Special note: Every effort is made to have your procedure done on time. Please understand that emergencies sometimes delay scheduled procedures.  2. Diet: Do not eat solid foods after midnight.  The patient may have clear liquids until 5am upon the day of the procedure.  3. Labs: You will need to have blood drawn on Thursday, August 18 at [facility]. [address]  Open: 7:30am - 5pm    Phone: [phone]. You do  not need to be fasting.  4. Medication instructions in preparation for your procedure:  Drink plenty of fluids throughout today until midnight.   Contrast Allergy: Yes, Please take Prednisone 50mg  by mouth at: Thirteen hours prior to cath 11:00pm on Thursday Seven hours prior to cath 5:00am on Friday And take last dose of Prednisone 50mg  and Benadryl 50mg  by mouth prior to heart catheterization. Since you will be there early,  bring with you to the hospital, so they can give it to you while they hydrate you prior to catheterization.  Stop taking, Lasix (Furosemide)  Friday, August 19,  On the morning of your procedure, take your Aspirin and any morning medicines NOT listed above.  You may use sips of water.  5. Plan for one night stay--bring personal belongings. 6. Bring a current list of your medications and current insurance cards. 7. You MUST have a responsible person to drive you home. 8. Someone MUST be with you the first 24 hours after you arrive home or your discharge will be delayed. 9. Please wear clothes that are easy to get on and off and wear slip-on shoes.  Thank you for allowing [place] to care for you!   -- [facility] In[facility] Cardi[facility] services   Signed, Sherren Mocha, MD  09/25/2020 12:51 PM    [place]

## 2020-09-24 NOTE — Progress Notes (Signed)
Cardiology Office Note:    Date:  09/25/2020   ID:  [name], DOB 02-14-1960, MRN [id number]  PCP:  Redmond School, MD   [facility] Providers Cardiologist:  Rozann Lesches, MD     Referring MD: Redmond School, MD   Chief Complaint  Patient presents with   Chest Pain    History of Present Illness:    [name] is a 60 y.o. female with a hx of multivisceral transplant in 2018 at [facility] with transplant of the liver, pancreas, stomach, and small bowel at that time.  She is referred by Dr. Domenic Polite for discussion of cardiac catheterization in the setting of progressive anginal symptoms, and abnormal Myoview scan demonstrating anteroapical ischemia, and stage IV chronic kidney disease at risk for contrast-induced nephropathy with invasive angiography.  She did well for a few years following transplantation, but over the past year she has developed severe limitation because of progressive fatigue, weakness, and chest discomfort. She states that she has severe symptoms with low level activity such as taking a shower or brushing her hair. With these activities she develops tightness in the center of her chest into the upper back and neck. Symptoms improve within a few minutes of rest. She has also experienced the same symptoms at rest.  Medical therapy has been escalated without improvement in symptoms.  Work-up to date has included an echocardiogram which demonstrated normal LV and RV function with no significant valvular disease.  A Myoview scan demonstrated anteroapical myocardial infarction with moderate peri-infarct ischemia and normal LV systolic function.    Past Medical History:  Diagnosis Date   Anemia    Anxiety    Ascites    CKD (chronic kidney disease) stage 4, GFR 15-29 ml/min (HCC)    Depression    Enteropathy 07/31/2015   Portal hypertensive enteropathy per capsule study, with stigmata of bleeding.   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   Type 2 diabetes mellitus ([place])     Past Surgical History:  Procedure Laterality Date   ABDOMINAL HYSTERECTOMY  2003   [place]   COLONOSCOPY     COLONOSCOPY  10/21/2011   Procedure: COLONOSCOPY;  Surgeon: Rogene Houston, MD;  Location[facility];  Service: Endoscopy;  Laterality: N/A;  [place]  12/08/2011   Procedure: CYSTOSCOPY WITH RETROGRADE PYELOGRAM;  Surgeon: Marissa Nestle, MD;  Location: [facility] ORS;  Service: Urology;  Laterality: Right;   CYSTOSCOPY WITH STENT PLACEMENT  12/08/2011   Procedure: CYSTOSCOPY WITH STENT PLACEMENT;  Surgeon: Marissa Nestle, MD;  Location: [facility] ORS;  Service: Urology;  Laterality: Right;   DILATION AND CURETTAGE OF UTERUS     ESOPHAGOGASTRODUODENOSCOPY N/A 02/19/2014   Procedure: ESOPHAGOGASTRODUODENOSCOPY (EGD);  Surgeon: Rogene Houston, MD;  Location: [facility];  Service: Endoscopy;  Laterality:  N/A;  100   ESOPHAGOGASTRODUODENOSCOPY N/A 07/15/2015   Procedure: ESOPHAGOGASTRODUODENOSCOPY (EGD);  Surgeon: Rogene Houston, MD;  [facility];  Service: Endoscopy;  Laterality: N/A;   ESOPHAGOGASTRODUODENOSCOPY N/A 07/29/2015   Procedure: ESOPHAGOGASTRODUODENOSCOPY (EGD);  Surgeon: Rogene Houston, MD;  [facility];  Service: Endoscopy;  Laterality: N/A;   GASTRIC BYPASS  ~ [place]  04/14/11   "one in my bellybutton" (11/07/2012)    INGUINAL HERNIA REPAIR Right    "maybe 2" (11/07/2012)   KNEE ARTHROSCOPY Right    LITHOTRIPSY     "several times" (11/07/2012)   OPEN REDUCTION INTERNAL FIXATION (ORIF) DISTAL RADIAL FRACTURE Right 11/07/2012   Procedure: OPEN REDUCTION INTERNAL FIXATION (ORIF) RIGHT DISTAL RADIAL FRACTURE;  Surgeon: [name], MD;  Location: [facility];  Service: Orthopedics;  Laterality: Right;   ORIF DISTAL RADIUS FRACTURE Right 11/07/2012   OTHER SURGICAL HISTORY  08/14/2016   5 organ transplant- liver, stomach, pancreas, small and large intestine   UPPER GASTROINTESTINAL ENDOSCOPY      Current Medications: Current Meds  Medication Sig   aspirin EC 81 MG tablet Take 1 tablet (81 mg total) by mouth daily. Swallow whole.   atenolol (TENORMIN) 25 MG tablet Take 25 mg by mouth 2 (two) times daily.   calcium-vitamin D (OSCAL WITH D) 500-200 MG-UNIT tablet Take 2 tablets by mouth 2 (two) times daily.   cycloSPORINE (SANDIMMUNE) 100 MG capsule Take 100 mg by mouth daily.   cycloSPORINE (SANDIMMUNE) 25 MG capsule Take 75 mg by mouth at bedtime.   diphenhydrAMINE (BENADRYL) 50 MG tablet Take one tablet by mouth prior to heart cath with prednisone   fluvastatin (LESCOL) 20 MG capsule Take 1 capsule (20 mg total) by mouth at bedtime.   furosemide (LASIX) 20 MG tablet Take 20 mg by mouth every Monday, Wednesday, and Friday.   isosorbide mononitrate (IMDUR) 30 MG 24 hr tablet Take daily 15 mg am and 30 mg at bedtime (Patient taking differently: Take 15-30 mg by mouth See admin instructions. Take daily 15 mg am and 30 mg at bedtime)   Multiple Vitamins-Minerals (SUPER THERA VITE M PO) Take 1 tablet by mouth daily.   mycophenolate (CELLCEPT) 250 MG capsule Take 250-500 mg by mouth See admin instructions. 500 mg in the morning. 250 mg in the evening   nitroGLYCERIN (NITROSTAT) 0.4 MG SL tablet Place 1 tablet (0.4 mg total) under the tongue every 5 (five) minutes as needed.   pantoprazole (PROTONIX) 40 MG tablet Take 40  mg by mouth at bedtime.   predniSONE (DELTASONE) 50 MG tablet Take one tablet by mouth on 09/24/20 at 11:30 pm, 09/25/20 at 5:00 am, and before you leave home   traZODone (DESYREL) 50 MG tablet Take 100 mg by mouth at bedtime.   Vitamin D, Ergocalciferol, (DRISDOL) 50000 units CAPS capsule Take 50,000 Units by mouth every 7 (seven) days. Take on Friday.   [DISCONTINUED] oxyCODONE (OXYCONTIN) 10 mg 12 hr tablet Take 10 mg by mouth every 12 (twelve) hours.   [DISCONTINUED] sodium bicarbonate 650 MG tablet Take 650 mg by mouth 2 (two) times daily.     Allergies:   Ancef [cefazolin sodium], Tapentadol, Gadobenate, Iodinated diagnostic agents, and Tape   Social History   Socioeconomic History   Marital status: Married    Spouse name: Not on file   Number of children: 1   Years of education: 12   Highest education level: Not on file  Occupational History  Not on file  Tobacco Use   Smoking status: Former    Packs/day: 0.50    Years: 5.00    Pack years: 2.50    Types: Cigarettes    Quit date: 05/02/1991    Years since quitting: 29.4   Smokeless tobacco: Never  Vaping Use   Vaping Use: Never used  Substance and Sexual Activity   Alcohol use: No    Alcohol/week: 0.0 standard drinks   Drug use: No   Sexual activity: Not on file  Other Topics Concern   Not on file  Social History Narrative   Patient is single and lives at home and her son lives with her.   Patient is disabled.   Patient has a high school education.   Patient is left-handed.   Patient drinks 24 oz of tea/soda daily.   Social Determinants of Health   Financial Resource Strain: Not on file  Food Insecurity: Not on file  Transportation Needs: Not on file  Physical Activity: Not on file  Stress: Not on file  Social Connections: Not on file     Family History: The patient's family history includes Dementia in her father and mother; Diabetes in her father; Heart disease in her father; Hypertension in her brother  and father; Liver disease in her father.  ROS:   Please see the history of present illness.    All other systems reviewed and are negative.  EKGs/Labs/Other Studies Reviewed:    The following studies were reviewed today: Echo: IMPRESSIONS     1. Left ventricular ejection fraction, by estimation, is 60 to 65%. The  left ventricle has normal function. The left ventricle has no regional  wall motion abnormalities. There is mild left ventricular hypertrophy.  Left ventricular diastolic parameters  were normal. Normal global longitudinal strain of -19.1%.   2. Right ventricular systolic function is normal. The right ventricular  size is normal. There is normal pulmonary artery systolic pressure. The  estimated right ventricular systolic pressure is 88.5 mmHg.   3. Left atrial size was mildly dilated.   4. The mitral valve is grossly normal. Mild mitral valve regurgitation.   5. The aortic valve is tricuspid. Aortic valve regurgitation is not  visualized.   6. The inferior vena cava is normal in size with greater than 50%  respiratory variability, suggesting right atrial pressure of 3 mmHg.   Comparison(s): Echocardiogram done 02/27/17 ay [facility] showed an EF of 56%.   Myoview Scan 05/25/2020: Study Result  Narrative & Impression  There was no ST segment deviation noted during stress. Findings consistent with prior mid to distal anterior/apical myocardial infarction with moderate peri-infarct ischemia. This is an intermediate risk study. The left ventricular ejection fraction is hyperdynamic (>65%).   EKG:  EKG is not ordered today.    Recent Labs: 08/27/2020: ALT 11 09/24/2020: Hemoglobin 10.9; Platelets 294 09/25/2020: BUN 27; Creatinine, Ser 2.56; Potassium 5.0; Sodium 138  Recent Lipid Panel    Component Value Date/Time   CHOL 136 08/27/2020 0849   TRIG 101 08/27/2020 0849   HDL 39 (L) 08/27/2020 0849   CHOLHDL 3.5 08/27/2020 0849   LDLCALC 78 08/27/2020 0849     Risk  Assessment/Calculations:           Physical Exam:    VS:  BP (!) 110/58   Pulse 61   Ht 5\' 5"  (1.651 m)   Wt 226 lb 6.4 oz (102.7 kg)   SpO2 97%   BMI 37.67 kg/m  Wt Readings from Last 3 Encounters:  09/25/20 226 lb (102.5 kg)  09/24/20 226 lb 6.4 oz (102.7 kg)  09/22/20 228 lb (103.4 kg)     GEN:  Well nourished, well developed in no acute distress HEENT: Normal NECK: No JVD; No carotid bruits LYMPHATICS: No lymphadenopathy CARDIAC: RRR, no murmurs, rubs, gallops RESPIRATORY:  Clear to auscultation without rales, wheezing or rhonchi  ABDOMEN: Soft, non-tender, non-distended MUSCULOSKELETAL:  No edema; No deformity  SKIN: Warm and dry NEUROLOGIC:  Alert and oriented x 3 PSYCHIATRIC:  Normal affect   ASSESSMENT:    1. Coronary artery disease involving native coronary artery of native heart with angina pectoris ([place])   2. Recurrent chest pain    PLAN:    In order of problems listed above:  The patient has CCS functional class III-IV symptoms of progressive angina, dyspnea, and fatigue despite aggressive medical therapy.  A Myoview scan demonstrates significant anterior wall infarct with peri-infarct ischemia.  I have a high suspicion that she has critical coronary artery disease.  It is unclear at this point whether she has single-vessel or multivessel disease but based on the severity of her symptoms, I think she is at high risk for multivessel disease or at least a large area of myocardium involved.  While cardiac catheterization will comment increased risk of renal complication, it is clearly indicated in order to prevent further clinical deterioration including significant risk for MI with her progressive symptoms noted.  Will bring her in early for prehydration, avoid all nephrotoxic drugs, and plan on doing limited contrast angiography.  The patient understands that if she requires intervention, it will likely need to be done in a staged fashion in order to minimize  contrast usage. I have reviewed the risks, indications, and alternatives to cardiac catheterization, possible angioplasty, and stenting with the patient. Risks include but are not limited to bleeding, infection, vascular injury, stroke, myocardial infection, arrhythmia, kidney injury, radiation-related injury in the case of prolonged fluoroscopy use, emergency cardiac surgery, and death. The patient understands the risks of serious complication is 1-2 in 3151 with diagnostic cardiac cath and 1-2% or less with angioplasty/stenting.  Again, she understands the risk of kidney injury is substantially higher in the setting of her baseline renal impairment and that all efforts will be made to minimize contrast utilization during her procedure.   Shared Decision Making/Informed Consent The risks [stroke (1 in 1000), death (1 in 1000), kidney failure [usually temporary] (1 in 500), bleeding (1 in 200), allergic reaction [possibly serious] (1 in 200)], benefits (diagnostic support and management of coronary artery disease) and alternatives of a cardiac catheterization were discussed in detail with Ms. Nass and she is willing to proceed.    Medication Adjustments/Labs and Tests Ordered: Current medicines are reviewed at length with the patient today.  Concerns regarding medicines are outlined above.  Orders Placed This Encounter  Procedures   Basic metabolic panel   CBC with Differential/Platelet   EKG 12-Lead   Meds ordered this encounter  Medications   predniSONE (DELTASONE) 50 MG tablet    Sig: Take one tablet by mouth on 09/24/20 at 11:30 pm, 09/25/20 at 5:00 am, and before you leave home    Dispense:  3 tablet    Refill:  0   diphenhydrAMINE (BENADRYL) 50 MG tablet    Sig: Take one tablet by mouth prior to heart cath with prednisone    Dispense:  1 tablet    Refill:  0    Patient  Instructions  Medication Instructions:  Your physician recommends that you continue on your current medications as  directed. Please refer to the Current Medication list given to you today.  *If you need a refill on your cardiac medications before your next appointment, please call your pharmacy*  Lab Work: Your physician recommends that you have lab work today BMET and CBC  If you have labs (blood work) drawn today and your tests are completely normal, you will receive your results only by: MyChart Message (if you have MyChart) OR A paper copy in the mail If you have any lab test that is abnormal or we need to change your treatment, we will call you to review the results.  Testing/Procedures: Your physician has requested that you have a cardiac catheterization. Cardiac catheterization is used to diagnose and/or treat various heart conditions. Doctors may recommend this procedure for a number of different reasons. The most common reason is to evaluate chest pain. Chest pain can be a symptom of coronary artery disease (CAD), and cardiac catheterization can show whether plaque is narrowing or blocking your heart's arteries. This procedure is also used to evaluate the valves, as well as measure the blood flow and oxygen levels in different parts of your heart. For further information please visit HugeFiesta.[email or web address]. Please follow instruction sheet, as given.  Follow-Up: At [facility], you and your health needs are our priority.  As part of our continuing mission to provide you with exceptional heart care, we have created designated Provider Care Teams.  These Care Teams include your primary Cardiologist (physician) and Advanced Practice Providers (APPs -  Physician Assistants and Nurse Practitioners) who all work together to provide you with the care you need, when you need it.  We recommend signing up for the patient portal called "MyChart".  Sign up information is provided on this After Visit Summary.  MyChart is used to connect with patients for Virtual Visits (Telemedicine).  Patients are able to view  lab/test results, encounter notes, upcoming appointments, etc.  Non-urgent messages can be sent to your provider as well.   To learn more about what you can do with MyChart, go to [email or web address].    Your next appointment:   2 week(s)  The format for your next appointment:   In Person  Provider:   You may see Rozann Lesches, MD or the following Advanced Practice Provider on your designated Care Team:   Katina Dung, NP   Other [place] OFFICE [place], [place] [address] Dept: [phone] Loc: [place]  09/24/2020  You are scheduled for a Cardiac Catheterization on Friday, August 19 with Dr. Sherren Mocha.  1. Please arrive at the [facility] (Main Entrance A) at [facility]: [address] [place], [place] [address] at 7:30 AM (This time is two hours before your procedure to ensure your preparation). Free valet parking service is available.   Special note: Every effort is made to have your procedure done on time. Please understand that emergencies sometimes delay scheduled procedures.  2. Diet: Do not eat solid foods after midnight.  The patient may have clear liquids until 5am upon the day of the procedure.  3. Labs: You will need to have blood drawn on Thursday, August 18 at [facility] at [facility] [address]  Open: 7:30am - 5pm    Phone: [phone]. You do  not need to be fasting.  4. Medication instructions in preparation for your procedure:  Drink plenty of fluids throughout today until midnight.   Contrast Allergy: Yes, Please take Prednisone 50mg  by mouth at: Thirteen hours prior to cath 11:00pm on Thursday Seven hours prior to cath 5:00am on Friday And take last dose of Prednisone 50mg  and Benadryl 50mg  by mouth prior to heart catheterization. Since you will be there early,  bring with you to the hospital, so they can give it to you while they hydrate you prior to catheterization.  Stop taking, Lasix (Furosemide)  Friday, August 19,  On the morning of your procedure, take your Aspirin and any morning medicines NOT listed above.  You may use sips of water.  5. Plan for one night stay--bring personal belongings. 6. Bring a current list of your medications and current insurance cards. 7. You MUST have a responsible person to drive you home. 8. Someone MUST be with you the first 24 hours after you arrive home or your discharge will be delayed. 9. Please wear clothes that are easy to get on and off and wear slip-on shoes.  Thank you for allowing [place] to care for you!   -- [facility] Invasive Cardiovascular services   Signed, Sherren Mocha, MD  09/25/2020 12:51 PM    [place]

## 2020-09-24 NOTE — Patient Instructions (Signed)
Medication Instructions:  Your physician recommends that you continue on your current medications as directed. Please refer to the Current Medication list given to you today.  *If you need a refill on your cardiac medications before your next appointment, please call your pharmacy*  Lab Work: Your physician recommends that you have lab work today BMET and CBC  If you have labs (blood work) drawn today and your tests are completely normal, you will receive your results only by: MyChart Message (if you have MyChart) OR A paper copy in the mail If you have any lab test that is abnormal or we need to change your treatment, we will call you to review the results.  Testing/Procedures: Your physician has requested that you have a cardiac catheterization. Cardiac catheterization is used to diagnose and/or treat various heart conditions. Doctors may recommend this procedure for a number of different reasons. The most common reason is to evaluate chest pain. Chest pain can be a symptom of coronary artery disease (CAD), and cardiac catheterization can show whether plaque is narrowing or blocking your heart's arteries. This procedure is also used to evaluate the valves, as well as measure the blood flow and oxygen levels in different parts of your heart. For further information please visit HugeFiesta.tn. Please follow instruction sheet, as given.  Follow-Up: At [facility], you and your health needs are our priority.  As part of our continuing mission to provide you with exceptional heart care, we have created designated Provider Care Teams.  These Care Teams include your primary Cardiologist (physician) and Advanced Practice Providers (APPs -  Physician Assistants and Nurse Practitioners) who all work together to provide you with the care you need, when you need it.  We recommend signing up for the patient portal called "MyChart".  Sign up information is provided on this After Visit Summary.   MyChart is used to connect with patients for Virtual Visits (Telemedicine).  Patients are able to view lab/test results, encounter notes, upcoming appointments, etc.  Non-urgent messages can be sent to your provider as well.   To learn more about what you can do with MyChart, go to [email or web address].    Your next appointment:   2 week(s)  The format for your next appointment:   In Person  Provider:   You may see Rozann Lesches, MD or the following Advanced Practice Provider on your designated Care Team:   Katina Dung, NP   Other [place] OFFICE [place], [place] [address] Dept: [phone] Loc: [place]  09/24/2020  You are scheduled for a Cardiac Catheterization on Friday, August 19 with Dr. Sherren Mocha.  1. Please arrive at the [facility] (Main Entrance A) at [facility]: [address] [place], [place] [address] at 7:30 AM (This time is two hours before your procedure to ensure your preparation). Free valet parking service is available.   Special note: Every effort is made to have your procedure done on time. Please understand that emergencies sometimes delay scheduled procedures.  2. Diet: Do not eat solid foods after midnight.  The patient may have clear liquids until 5am upon the day of the procedure.  3. Labs: You will need to have blood drawn on Thursday, August 18 at [facility] at [facility]. [address]  Open: 7:30am - 5pm    Phone: [phone]. You do not need to  be fasting.  4. Medication instructions in preparation for your procedure:  Drink plenty of fluids throughout today until midnight.   Contrast Allergy: Yes, Please take Prednisone 50mg  by mouth at: Thirteen hours prior to cath 11:00pm on Thursday Seven hours prior to cath 5:00am on Friday And take last dose of  Prednisone 50mg  and Benadryl 50mg  by mouth prior to heart catheterization. Since you will be there early, bring with you to the hospital, so they can give it to you while they hydrate you prior to catheterization.  Stop taking, Lasix (Furosemide)  Friday, August 19,  On the morning of your procedure, take your Aspirin and any morning medicines NOT listed above.  You may use sips of water.  5. Plan for one night stay--bring personal belongings. 6. Bring a current list of your medications and current insurance cards. 7. You MUST have a responsible person to drive you home. 8. Someone MUST be with you the first 24 hours after you arrive home or your discharge will be delayed. 9. Please wear clothes that are easy to get on and off and wear slip-on shoes.  Thank you for allowing [place] to care for you!   -- [place] Invasive Cardiovascular services

## 2020-09-25 ENCOUNTER — Ambulatory Visit (HOSPITAL_COMMUNITY)
Admission: RE | Admit: 2020-09-25 | Discharge: 2020-09-25 | Disposition: A | Discharge status: Home / Self Care | Payer: Medicare Other | Attending: Cardiovascular Disease | Admitting: Cardiovascular Disease

## 2020-09-25 ENCOUNTER — Encounter (HOSPITAL_COMMUNITY)
Admission: RE | Disposition: A | Discharge status: Home / Self Care | Payer: Self-pay | Source: Home / Self Care | Attending: Cardiovascular Disease

## 2020-09-25 ENCOUNTER — Encounter: Payer: Self-pay | Admitting: Cardiovascular Disease

## 2020-09-25 DIAGNOSIS — N184 Chronic kidney disease, stage 4 (severe): Secondary | ICD-10-CM | POA: Diagnosis not present

## 2020-09-25 DIAGNOSIS — I25119 Atherosclerotic heart disease of native coronary artery with unspecified angina pectoris: Secondary | ICD-10-CM | POA: Diagnosis present

## 2020-09-25 DIAGNOSIS — Z8249 Family history of ischemic heart disease and other diseases of the circulatory system: Secondary | ICD-10-CM | POA: Diagnosis not present

## 2020-09-25 DIAGNOSIS — Z79899 Other long term (current) drug therapy: Secondary | ICD-10-CM | POA: Diagnosis not present

## 2020-09-25 DIAGNOSIS — Z944 Liver transplant status: Secondary | ICD-10-CM | POA: Diagnosis not present

## 2020-09-25 DIAGNOSIS — Z91041 Radiographic dye allergy status: Secondary | ICD-10-CM | POA: Insufficient documentation

## 2020-09-25 DIAGNOSIS — Z7982 Long term (current) use of aspirin: Secondary | ICD-10-CM | POA: Diagnosis not present

## 2020-09-25 DIAGNOSIS — Z87891 Personal history of nicotine dependence: Secondary | ICD-10-CM | POA: Insufficient documentation

## 2020-09-25 DIAGNOSIS — E1122 Type 2 diabetes mellitus with diabetic chronic kidney disease: Secondary | ICD-10-CM | POA: Insufficient documentation

## 2020-09-25 DIAGNOSIS — Z888 Allergy status to other drugs, medicaments and biological substances status: Secondary | ICD-10-CM | POA: Insufficient documentation

## 2020-09-25 DIAGNOSIS — R0600 Dyspnea, unspecified: Secondary | ICD-10-CM | POA: Diagnosis not present

## 2020-09-25 DIAGNOSIS — I2582 Chronic total occlusion of coronary artery: Secondary | ICD-10-CM | POA: Diagnosis not present

## 2020-09-25 DIAGNOSIS — Z881 Allergy status to other antibiotic agents status: Secondary | ICD-10-CM | POA: Insufficient documentation

## 2020-09-25 HISTORY — PX: LEFT HEART CATH AND CORONARY ANGIOGRAPHY: CATH118249

## 2020-09-25 LAB — BASIC METABOLIC PANEL
Anion gap: 8 (ref 5–15)
BUN/Creatinine Ratio: 11 (ref 9–23)
BUN: 25 mg/dL — ABNORMAL HIGH (ref 6–24)
BUN: 27 mg/dL — ABNORMAL HIGH (ref 6–20)
CO2: 19 mmol/L — ABNORMAL LOW (ref 20–29)
CO2: 22 mmol/L (ref 22–32)
Calcium: 8.8 mg/dL (ref 8.7–10.2)
Calcium: 8.6 mg/dL — ABNORMAL LOW (ref 8.9–10.3)
Chloride: 108 mmol/L — ABNORMAL HIGH (ref 96–106)
Chloride: 108 mmol/L (ref 98–111)
Creatinine, Ser: 2.36 mg/dL — ABNORMAL HIGH (ref 0.57–1.00)
Creatinine, Ser: 2.56 mg/dL — ABNORMAL HIGH (ref 0.44–1.00)
GFR, Estimated: 21 mL/min — ABNORMAL LOW (ref >60)
Glucose, Bld: 128 mg/dL — ABNORMAL HIGH (ref 70–99)
Glucose: 93 mg/dL (ref 65–99)
Potassium: 5.7 mmol/L — ABNORMAL HIGH (ref 3.5–5.2)
Potassium: 5 mmol/L (ref 3.5–5.1)
Sodium: 140 mmol/L (ref 134–144)
Sodium: 138 mmol/L (ref 135–145)
eGFR: 23 mL/min/{1.73_m2} — ABNORMAL LOW (ref >59)

## 2020-09-25 LAB — CBC WITH DIFFERENTIAL/PLATELET
Basophils Absolute: 0.1 10*3/uL (ref 0.0–0.2)
Basos: 1 %
EOS (ABSOLUTE): 0.4 10*3/uL (ref 0.0–0.4)
Eos: 5 %
Hematocrit: 35.4 % (ref 34.0–46.6)
Hemoglobin: 10.9 g/dL — ABNORMAL LOW (ref 11.1–15.9)
Immature Grans (Abs): 0 10*3/uL (ref 0.0–0.1)
Immature Granulocytes: 0 %
Lymphocytes Absolute: 1.9 10*3/uL (ref 0.7–3.1)
Lymphs: 25 %
MCH: 29.1 pg (ref 26.6–33.0)
MCHC: 30.8 g/dL — ABNORMAL LOW (ref 31.5–35.7)
MCV: 94 fL (ref 79–97)
Monocytes Absolute: 0.6 10*3/uL (ref 0.1–0.9)
Monocytes: 8 %
Neutrophils Absolute: 4.6 10*3/uL (ref 1.4–7.0)
Neutrophils: 61 %
Platelets: 294 10*3/uL (ref 150–450)
RBC: 3.75 x10E6/uL — ABNORMAL LOW (ref 3.77–5.28)
RDW: 12.2 % (ref 11.7–15.4)
WBC: 7.6 10*3/uL (ref 3.4–10.8)

## 2020-09-25 LAB — GLUCOSE, CAPILLARY: Glucose-Capillary: 162 mg/dL — ABNORMAL HIGH (ref 70–99)

## 2020-09-25 SURGERY — LEFT HEART CATH AND CORONARY ANGIOGRAPHY
Anesthesia: LOCAL

## 2020-09-25 MED ORDER — MIDAZOLAM HCL 2 MG/2ML IJ SOLN
INTRAMUSCULAR | Status: DC | PRN
  Administered 2020-09-25 (×2): 1 mg via INTRAVENOUS

## 2020-09-25 MED ORDER — LIDOCAINE HCL (PF) 1 % IJ SOLN
INTRAMUSCULAR | Status: DC | PRN
  Administered 2020-09-25: 2 mL

## 2020-09-25 MED ORDER — SODIUM CHLORIDE 0.9% FLUSH: 3.0000 mL | INTRAVENOUS | Status: DC | PRN

## 2020-09-25 MED ORDER — VERAPAMIL HCL 2.5 MG/ML IV SOLN
INTRAVENOUS | Status: DC | PRN
  Administered 2020-09-25: 10 mL via INTRA_ARTERIAL

## 2020-09-25 MED ORDER — FENTANYL CITRATE (PF) 100 MCG/2ML IJ SOLN
INTRAMUSCULAR | Status: AC
  Filled 2020-09-25: qty 2

## 2020-09-25 MED ORDER — SODIUM CHLORIDE 0.9% FLUSH: 3.0000 mL | Freq: Two times a day (BID) | INTRAVENOUS | Status: DC

## 2020-09-25 MED ORDER — FENTANYL CITRATE (PF) 100 MCG/2ML IJ SOLN
INTRAMUSCULAR | Status: DC | PRN
  Administered 2020-09-25 (×2): 25 ug via INTRAVENOUS

## 2020-09-25 MED ORDER — HEPARIN SODIUM (PORCINE) 1000 UNIT/ML IJ SOLN
INTRAMUSCULAR | Status: DC | PRN
  Administered 2020-09-25: 5000 [IU] via INTRAVENOUS

## 2020-09-25 MED ORDER — HYDRALAZINE HCL 20 MG/ML IJ SOLN: 10.0000 mg | INTRAMUSCULAR | Status: DC | PRN

## 2020-09-25 MED ORDER — ONDANSETRON HCL 4 MG/2ML IJ SOLN: 4.0000 mg | Freq: Four times a day (QID) | INTRAMUSCULAR | Status: DC | PRN

## 2020-09-25 MED ORDER — IOHEXOL 350 MG/ML SOLN
INTRAVENOUS | Status: DC | PRN
  Administered 2020-09-25: 30 mL

## 2020-09-25 MED ORDER — HEPARIN (PORCINE) IN NACL 1000-0.9 UT/500ML-% IV SOLN
INTRAVENOUS | Status: DC | PRN
  Administered 2020-09-25: 500 mL

## 2020-09-25 MED ORDER — CLOPIDOGREL BISULFATE 75 MG PO TABS: 75.0000 mg | ORAL_TABLET | Freq: Every day | ORAL | 11 refills | Status: DC

## 2020-09-25 MED ORDER — ACETAMINOPHEN 325 MG PO TABS: 650.0000 mg | ORAL_TABLET | ORAL | Status: DC | PRN

## 2020-09-25 MED ORDER — SODIUM CHLORIDE 0.9 % WEIGHT BASED INFUSION: 1.0000 mL/kg/h | INTRAVENOUS | Status: DC

## 2020-09-25 MED ORDER — SODIUM CHLORIDE 0.9 % WEIGHT BASED INFUSION
3.0000 mL/kg/h | INTRAVENOUS | Status: AC
  Administered 2020-09-25: 3 mL/kg/h via INTRAVENOUS

## 2020-09-25 MED ORDER — LABETALOL HCL 5 MG/ML IV SOLN: 10.0000 mg | INTRAVENOUS | Status: DC | PRN

## 2020-09-25 MED ORDER — HEPARIN SODIUM (PORCINE) 1000 UNIT/ML IJ SOLN
INTRAMUSCULAR | Status: AC
  Filled 2020-09-25: qty 1

## 2020-09-25 MED ORDER — LIDOCAINE HCL (PF) 1 % IJ SOLN
INTRAMUSCULAR | Status: AC
  Filled 2020-09-25: qty 30

## 2020-09-25 MED ORDER — HEPARIN (PORCINE) IN NACL 1000-0.9 UT/500ML-% IV SOLN
INTRAVENOUS | Status: AC
  Filled 2020-09-25: qty 1000

## 2020-09-25 MED ORDER — ASPIRIN 81 MG PO CHEW: 81.0000 mg | CHEWABLE_TABLET | ORAL | Status: DC

## 2020-09-25 MED ORDER — SODIUM CHLORIDE 0.9 % IV SOLN: 250.0000 mL | INTRAVENOUS | Status: DC | PRN

## 2020-09-25 MED ORDER — MIDAZOLAM HCL 2 MG/2ML IJ SOLN
INTRAMUSCULAR | Status: AC
  Filled 2020-09-25: qty 2

## 2020-09-25 SURGICAL SUPPLY — 11 items
CATH 5FR JL3.5 JR4 ANG PIG MP (CATHETERS) ×1 IMPLANT
CATH LAUNCHER 5F NOTO (CATHETERS) IMPLANT
CATHETER LAUNCHER 5F NOTO (CATHETERS) ×2
DEVICE RAD COMP TR BAND LRG (VASCULAR PRODUCTS) ×1 IMPLANT
GLIDESHEATH SLEND SS 6F .021 (SHEATH) ×1 IMPLANT
GUIDEWIRE INQWIRE 1.5J.035X260 (WIRE) IMPLANT
INQWIRE 1.5J .035X260CM (WIRE) ×2
KIT HEART LEFT (KITS) ×2 IMPLANT
PACK CARDIAC CATHETERIZATION (CUSTOM PROCEDURE TRAY) ×2 IMPLANT
TRANSDUCER W/STOPCOCK (MISCELLANEOUS) ×2 IMPLANT
TUBING CIL FLEX 10 FLL-RA (TUBING) ×2 IMPLANT

## 2020-09-25 NOTE — Progress Notes (Addendum)
Interventional cardiology:  I reviewed angiograms with Dr Burt Knack. Reviewed patient history, imaging studies including Echo and Myoview. Discussed patient history with her.   She is clearly symptomatic from a coronary standpoint with at least class 3 angina on good medical therapy. Her anatomy is favorable for CTO PCI with relatively short occlusion without a lot of calcification. Procedural success rate of approximately  80%.   I reviewed CTO PCI procedure with the patient. Discussed need for DAPT, dual groin access and overnight stay post procedure. Will need to make sure renal function is stable. Will attempt to limit contrast load as much as possible and hydrate prior to procedure.  The procedure and risks were reviewed including but not limited to death, myocardial infarction, stroke, arrythmias, bleeding, transfusion, emergency surgery, dye allergy, perforation, emergent pericardiocentesis, vascular complications, or renal dysfunction. The patient voices understanding and is agreeable to proceed.  Will plan on bringing her back for procedure on Sept 28.   Imagene Boss [place] MD, [facility]

## 2020-09-25 NOTE — Interval H&P Note (Signed)
History and Physical Interval Note:  09/25/2020 2:47 PM  [name]  has presented today for surgery, with the diagnosis of CAD.  The various methods of treatment have been discussed with the patient and family. After consideration of risks, benefits and other options for treatment, the patient has consented to  Procedure(s): LEFT HEART CATH AND CORONARY ANGIOGRAPHY (N/A) as a surgical intervention.  The patient's history has been reviewed, patient examined, no change in status, stable for surgery.  I have reviewed the patient's chart and labs.  Questions were answered to the patient's satisfaction.     Sherren Mocha

## 2020-09-27 ENCOUNTER — Encounter (HOSPITAL_COMMUNITY): Payer: Self-pay | Admitting: Cardiovascular Disease

## 2020-09-30 ENCOUNTER — Other Ambulatory Visit (HOSPITAL_COMMUNITY): Payer: Self-pay | Admitting: Nephrology

## 2020-09-30 DIAGNOSIS — E875 Hyperkalemia: Secondary | ICD-10-CM

## 2020-09-30 DIAGNOSIS — R809 Proteinuria, unspecified: Secondary | ICD-10-CM

## 2020-09-30 DIAGNOSIS — E1122 Type 2 diabetes mellitus with diabetic chronic kidney disease: Secondary | ICD-10-CM

## 2020-10-01 NOTE — Pre-Procedure Instructions (Signed)
Called Patient to inform her when her PCI is scheduled.  She is scheduled for Wednesday 11/04/20 at 11:30, to arrive at 5:30 for IVF.  She will be NPO after midnight,  I told her she will be spending the night.  Told her to be sure to take her ASA & Plavix everyday.  She states she has routine blood work scheduled for the week of Sept 12th, we can use that blood work for tomorrow.

## 2020-10-06 ENCOUNTER — Telehealth: Payer: Self-pay | Admitting: Cardiology

## 2020-10-06 DIAGNOSIS — R319 Hematuria, unspecified: Secondary | ICD-10-CM

## 2020-10-06 NOTE — Telephone Encounter (Signed)
Returned call to pt. No answer. Left msg to call back.  

## 2020-10-06 NOTE — Telephone Encounter (Signed)
Spoke with pt who c/o blood in urine. Pt denies pain while voiding, Abd pain and back pain. States that her urine is pink in color. Pt reports that she was told by kidney doctor to call our office. She started Plavix 09/25/20 after her cath. Please advise.

## 2020-10-07 DIAGNOSIS — R319 Hematuria, unspecified: Secondary | ICD-10-CM | POA: Diagnosis not present

## 2020-10-07 NOTE — Telephone Encounter (Signed)
Pt notified to have lab work done today. Orders placed.

## 2020-10-07 NOTE — Telephone Encounter (Signed)
Returned call to pt. No answer. Left msg to call back.  

## 2020-10-08 ENCOUNTER — Emergency Department (HOSPITAL_COMMUNITY)
Admission: EM | Admit: 2020-10-08 | Discharge: 2020-10-08 | Disposition: A | Discharge status: Home / Self Care | Payer: Medicare Other | Attending: Student | Admitting: Student

## 2020-10-08 ENCOUNTER — Other Ambulatory Visit: Payer: Self-pay

## 2020-10-08 ENCOUNTER — Ambulatory Visit (HOSPITAL_COMMUNITY): Payer: Medicare Other

## 2020-10-08 ENCOUNTER — Telehealth: Payer: Self-pay | Admitting: Cardiology

## 2020-10-08 ENCOUNTER — Emergency Department (HOSPITAL_COMMUNITY): Payer: Medicare Other

## 2020-10-08 ENCOUNTER — Encounter (HOSPITAL_COMMUNITY): Payer: Self-pay

## 2020-10-08 ENCOUNTER — Telehealth: Payer: Self-pay

## 2020-10-08 DIAGNOSIS — R319 Hematuria, unspecified: Secondary | ICD-10-CM | POA: Diagnosis not present

## 2020-10-08 DIAGNOSIS — Z7982 Long term (current) use of aspirin: Secondary | ICD-10-CM | POA: Diagnosis not present

## 2020-10-08 DIAGNOSIS — N200 Calculus of kidney: Secondary | ICD-10-CM | POA: Diagnosis not present

## 2020-10-08 DIAGNOSIS — E875 Hyperkalemia: Secondary | ICD-10-CM | POA: Diagnosis not present

## 2020-10-08 DIAGNOSIS — N189 Chronic kidney disease, unspecified: Secondary | ICD-10-CM | POA: Diagnosis not present

## 2020-10-08 DIAGNOSIS — K7689 Other specified diseases of liver: Secondary | ICD-10-CM | POA: Diagnosis not present

## 2020-10-08 DIAGNOSIS — Z7902 Long term (current) use of antithrombotics/antiplatelets: Secondary | ICD-10-CM | POA: Insufficient documentation

## 2020-10-08 DIAGNOSIS — K5730 Diverticulosis of large intestine without perforation or abscess without bleeding: Secondary | ICD-10-CM | POA: Diagnosis not present

## 2020-10-08 DIAGNOSIS — K5792 Diverticulitis of intestine, part unspecified, without perforation or abscess without bleeding: Secondary | ICD-10-CM | POA: Insufficient documentation

## 2020-10-08 DIAGNOSIS — N184 Chronic kidney disease, stage 4 (severe): Secondary | ICD-10-CM | POA: Diagnosis not present

## 2020-10-08 DIAGNOSIS — I25118 Atherosclerotic heart disease of native coronary artery with other forms of angina pectoris: Secondary | ICD-10-CM | POA: Diagnosis not present

## 2020-10-08 DIAGNOSIS — R809 Proteinuria, unspecified: Secondary | ICD-10-CM | POA: Diagnosis not present

## 2020-10-08 DIAGNOSIS — E1122 Type 2 diabetes mellitus with diabetic chronic kidney disease: Secondary | ICD-10-CM | POA: Diagnosis not present

## 2020-10-08 DIAGNOSIS — N3001 Acute cystitis with hematuria: Secondary | ICD-10-CM | POA: Insufficient documentation

## 2020-10-08 DIAGNOSIS — Z87891 Personal history of nicotine dependence: Secondary | ICD-10-CM | POA: Diagnosis not present

## 2020-10-08 DIAGNOSIS — E1129 Type 2 diabetes mellitus with other diabetic kidney complication: Secondary | ICD-10-CM | POA: Diagnosis not present

## 2020-10-08 LAB — COMPREHENSIVE METABOLIC PANEL
ALT: 9 U/L (ref 0–44)
AST: 11 U/L — ABNORMAL LOW (ref 15–41)
Albumin: 3.6 g/dL (ref 3.5–5.0)
Alkaline Phosphatase: 124 U/L (ref 38–126)
Anion gap: 4 — ABNORMAL LOW (ref 5–15)
BUN: 24 mg/dL — ABNORMAL HIGH (ref 6–20)
CO2: 19 mmol/L — ABNORMAL LOW (ref 22–32)
Calcium: 7.8 mg/dL — ABNORMAL LOW (ref 8.9–10.3)
Chloride: 114 mmol/L — ABNORMAL HIGH (ref 98–111)
Creatinine, Ser: 1.91 mg/dL — ABNORMAL HIGH (ref 0.44–1.00)
GFR, Estimated: 30 mL/min — ABNORMAL LOW (ref >60)
Glucose, Bld: 109 mg/dL — ABNORMAL HIGH (ref 70–99)
Potassium: 4.3 mmol/L (ref 3.5–5.1)
Sodium: 137 mmol/L (ref 135–145)
Total Bilirubin: 0.4 mg/dL (ref 0.3–1.2)
Total Protein: 6.4 g/dL — ABNORMAL LOW (ref 6.5–8.1)

## 2020-10-08 LAB — CBC WITH DIFFERENTIAL/PLATELET
Abs Immature Granulocytes: 0.03 10*3/uL (ref 0.00–0.07)
Basophils Absolute: 0.1 10*3/uL (ref 0.0–0.1)
Basophils Relative: 1 %
Eosinophils Absolute: 0.4 10*3/uL (ref 0.0–0.5)
Eosinophils Relative: 4 %
HCT: 38.5 % (ref 36.0–46.0)
Hemoglobin: 11.3 g/dL — ABNORMAL LOW (ref 12.0–15.0)
Immature Granulocytes: 0 %
Lymphocytes Relative: 26 %
Lymphs Abs: 2.5 10*3/uL (ref 0.7–4.0)
MCH: 29.9 pg (ref 26.0–34.0)
MCHC: 29.4 g/dL — ABNORMAL LOW (ref 30.0–36.0)
MCV: 101.9 fL — ABNORMAL HIGH (ref 80.0–100.0)
Monocytes Absolute: 0.6 10*3/uL (ref 0.1–1.0)
Monocytes Relative: 6 %
Neutro Abs: 6.2 10*3/uL (ref 1.7–7.7)
Neutrophils Relative %: 63 %
Platelets: 284 10*3/uL (ref 150–400)
RBC: 3.78 MIL/uL — ABNORMAL LOW (ref 3.87–5.11)
RDW: 13.2 % (ref 11.5–15.5)
WBC: 9.7 10*3/uL (ref 4.0–10.5)
nRBC: 0 % (ref 0.0–0.2)

## 2020-10-08 LAB — BASIC METABOLIC PANEL WITH GFR
BUN/Creatinine Ratio: 12 (ref 9–23)
BUN: 21 mg/dL (ref 6–24)
CO2: 19 mmol/L — ABNORMAL LOW (ref 20–29)
Calcium: 8.8 mg/dL (ref 8.7–10.2)
Chloride: 110 mmol/L — ABNORMAL HIGH (ref 96–106)
Creatinine, Ser: 1.82 mg/dL — ABNORMAL HIGH (ref 0.57–1.00)
Glucose: 101 mg/dL — ABNORMAL HIGH (ref 65–99)
Potassium: 5.1 mmol/L (ref 3.5–5.2)
Sodium: 140 mmol/L (ref 134–144)
eGFR: 32 mL/min/{1.73_m2} — ABNORMAL LOW

## 2020-10-08 LAB — URINALYSIS, ROUTINE W REFLEX MICROSCOPIC
Bilirubin Urine: NEGATIVE
Glucose, UA: NEGATIVE mg/dL
Ketones, ur: NEGATIVE mg/dL
Nitrite: POSITIVE — AB
Protein, ur: 30 mg/dL — AB
Specific Gravity, Urine: 1.025 (ref 1.005–1.030)
pH: 5.5 (ref 5.0–8.0)

## 2020-10-08 LAB — URINALYSIS
Bilirubin, UA: NEGATIVE
Glucose, UA: NEGATIVE
Ketones, UA: NEGATIVE
Nitrite, UA: POSITIVE — AB
Specific Gravity, UA: 1.015 (ref 1.005–1.030)
Urobilinogen, Ur: 0.2 mg/dL (ref 0.2–1.0)
pH, UA: 5.5 (ref 5.0–7.5)

## 2020-10-08 LAB — PROTIME-INR
INR: 1 (ref 0.8–1.2)
Prothrombin Time: 13.2 seconds (ref 11.4–15.2)

## 2020-10-08 LAB — URINALYSIS, MICROSCOPIC (REFLEX): WBC, UA: >50 WBC/hpf (ref 0–5)

## 2020-10-08 MED ORDER — SODIUM CHLORIDE 0.9 % IV SOLN
1.0000 g | Freq: Once | INTRAVENOUS | Status: AC
  Administered 2020-10-08: 1 g via INTRAVENOUS
  Filled 2020-10-08: qty 10

## 2020-10-08 MED ORDER — ONDANSETRON HCL 4 MG PO TABS: 4.0000 mg | ORAL_TABLET | Freq: Four times a day (QID) | ORAL | 0 refills | Status: DC

## 2020-10-08 MED ORDER — OXYCODONE-ACETAMINOPHEN 5-325 MG PO TABS
1.0000 | ORAL_TABLET | Freq: Once | ORAL | Status: AC
  Administered 2020-10-08: 1 via ORAL
  Filled 2020-10-08: qty 1

## 2020-10-08 MED ORDER — CIPROFLOXACIN HCL 500 MG PO TABS: 500.0000 mg | ORAL_TABLET | Freq: Two times a day (BID) | ORAL | 0 refills | Status: DC

## 2020-10-08 MED ORDER — METRONIDAZOLE 500 MG PO TABS: 500.0000 mg | ORAL_TABLET | Freq: Two times a day (BID) | ORAL | 0 refills | Status: DC

## 2020-10-08 MED ORDER — ONDANSETRON HCL 4 MG/2ML IJ SOLN
4.0000 mg | Freq: Once | INTRAMUSCULAR | Status: AC
  Administered 2020-10-08: 4 mg via INTRAVENOUS
  Filled 2020-10-08: qty 2

## 2020-10-08 MED ORDER — FLUCONAZOLE 150 MG PO TABS: ORAL_TABLET | ORAL | 1 refills | Status: DC

## 2020-10-08 MED ORDER — FENTANYL CITRATE PF 50 MCG/ML IJ SOSY
50.0000 ug | PREFILLED_SYRINGE | Freq: Once | INTRAMUSCULAR | Status: AC
  Administered 2020-10-08: 50 ug via INTRAVENOUS
  Filled 2020-10-08: qty 1

## 2020-10-08 NOTE — Telephone Encounter (Signed)
Spoke with [name] at [facility] who states that urine culture was not collected. [name] will check and see if urine is available for culture and fax requisition to our office.

## 2020-10-08 NOTE — Telephone Encounter (Signed)
Safest option would be fosfomycin single 3 g dose mixed in 3 to 4 oz of water ORALLY with or without food.  Unfortunately this medication is sometimes not covered by insurance.  If not approved please let me know.

## 2020-10-08 NOTE — Telephone Encounter (Signed)
Concerning that she has abdominal pain and blood in her urine. I think if she was just having some dysuria or polyuria could consider just treating a potential UTI, but with abdominal pain you worry about possible kidney stone or if having flank pain possible pyelnonephritis. Out of the scope of what typically cardiology clinic would manage.  I would suggest again ER evaluation or discussing with her pcp. If she turns down I would defer to Dr Domenic Polite any additional recs he may have tomorrow   Carlyle Dolly MD

## 2020-10-08 NOTE — Telephone Encounter (Signed)
Patient called stating that she is having a lot of abdominal pains with bloody urine.. States that she is nausea.

## 2020-10-08 NOTE — ED Provider Notes (Signed)
[facility] EMERGENCY DEPARTMENT Provider Note   CSN: [id number] Arrival date & time: 10/08/20  1630     History Chief Complaint  Patient presents with   Hematuria    [name] is a 60 y.o. female.   Hematuria Associated symptoms include abdominal pain. Pertinent negatives include no chest pain, no headaches and no shortness of breath.       [name] is a 60 y.o. female with past medical history of stage IV CKD, anemia, type 2 diabetes, and immunosuppressed, status post liver, pancreas, and small bowel transplants who presents to the Emergency Department complaining of possible urinary tract infection and hematuria.  States she was recently seen by her cardiologist.  She was started on Plavix 09/23/2020 and she is scheduled for catheterization in late September.  She has also seen nephrology regarding her CKD and a renal ultrasound was ordered for today.  Patient is complaining of right flank pain and bilateral lower abdominal pain that has been gradually worsening for several days.  Today, she notes that her urine was tea colored and she endorses having some urinary frequency and voiding small amounts.  She denies any fever, chills, nausea or vomiting.  No chest pain or shortness of breath.   Past Medical History:  Diagnosis Date   Anemia    Anxiety    Ascites    CKD (chronic kidney disease) stage 4, GFR 15-29 ml/min (HCC)    Depression    Enteropathy 07/31/2015   Portal hypertensive enteropathy per capsule study, with stigmata of bleeding.   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   Type 2 diabetes mellitus [facility])     Patient Active Problem List   Diagnosis Date Noted   Coronary artery disease involving native coronary artery with angina pectoris ([place]) 09/25/2020   Diarrhea 03/11/2017   Nausea & vomiting 03/11/2017   Sepsis ([place]) 03/11/2017   Dehydration 03/11/2017   Immunosuppressed status ([place])    Influenza-like illness 03/08/2017   Acute bronchitis, viral 03/08/2017   Anemia 03/06/2017   Headache(784.0) 03/06/2017   Elevated blood pressure reading 03/06/2017   Liver transplant recipient [facility]) 03/06/2017   History of pancreas transplant ([place]) 03/06/2017   Acute viral syndrome 03/06/2017   Pancytopenia ([place]) 11/13/2015   Cirrhosis of liver with ascites ([place])    Abdominal pain 75/64/3329   Alcoholic cirrhosis of liver with ascites ([place])    Enteropathy 07/31/2015   Anemia, blood loss    Depression with anxiety 07/30/2015   Esophageal varices in cirrhosis ([place]) 07/30/2015   Gastritis determined by endoscopy 07/30/2015   Hypotension 07/30/2015   Hyponatremia 07/29/2015   Hypokalemia 07/29/2015   GI bleeding 07/29/2015   Gastric ulcer 07/29/2015   Leukopenia 07/29/2015   AKI (acute kidney injury) ([place]) 07/29/2015   Diabetes mellitus type 2, controlled, without complications ([place]) 51/88/4166   GI bleed 07/17/2015   Palliative care  encounter    Goals of care, counseling/discussion    DNR (do not resuscitate) discussion    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   Melena 07/14/2015   Hepatic cirrhosis due to chronic hepatitis C infection ([place]) 07/14/2015   History of hepatitis C - successfuly treated medically 07/14/2015   Portal vein thrombosis 05/13/2014   Other malaise and fatigue 08/05/2013   Hypoxemia 08/05/2013   Abdominal pain, right upper quadrant 05/29/2013   SBP  (spontaneous bacterial peritonitis) ([place]) 05/28/2013   Distal radius fracture 10/25/2012   Metatarsal bone fracture 10/25/2012   Insomnia 09/25/2012   Hepatic encephalopathy ([place]) 06/07/2012   Hyperglycemia 06/06/2012   LUQ abdominal pain 06/05/2012   Ascites 05/14/2012   Gram-negative bacteremia 12/08/2011   Pyelonephritis 12/07/2011   Hydronephrosis of right kidney 12/07/2011   Right ureteral stone 12/07/2011   Thrombocytopenia ([place]) 12/07/2011   Cirrhosis of liver ([place]) 05/02/2011   Acute blood loss anemia 05/02/2011    Past Surgical History:  Procedure Laterality Date   ABDOMINAL HYSTERECTOMY  2003   [place]   COLONOSCOPY     COLONOSCOPY  10/21/2011   Procedure: COLONOSCOPY;  Surgeon: Rogene Houston, MD;  Location: [facility] ENDO [facility];  Service: Endoscopy;  Laterality: N/A;  [place]  12/08/2011   Procedure: CYSTOSCOPY WITH RETROGRADE PYELOGRAM;  Surgeon: Marissa Nestle, MD;  Location: [facility] ORS;  Service: Urology;  Laterality: Right;   CYSTOSCOPY WITH STENT PLACEMENT  12/08/2011   Procedure: CYSTOSCOPY WITH STENT PLACEMENT;  Surgeon: Marissa Nestle, MD;  Location: [facility] ORS;  Service: Urology;  Laterality: Right;   DILATION AND CURETTAGE OF UTERUS     ESOPHAGOGASTRODUODENOSCOPY N/A 02/19/2014   Procedure: ESOPHAGOGASTRODUODENOSCOPY (EGD);  Surgeon: Rogene Houston, MD;  Location: [facility];  Service: Endoscopy;  Laterality: N/A;  100   ESOPHAGOGASTRODUODENOSCOPY N/A 07/15/2015   Procedure: ESOPHAGOGASTRODUODENOSCOPY (EGD);  Surgeon: Rogene Houston, MD;  Location: [facility];  Service: Endoscopy;  Laterality: N/A;   ESOPHAGOGASTRODUODENOSCOPY N/A 07/29/2015   Procedure: ESOPHAGOGASTRODUODENOSCOPY (EGD);  Surgeon: Rogene Houston, MD;  Location: [facility];  Service: Endoscopy;  Laterality: N/A;   GASTRIC BYPASS  ~ [place]  04/14/11   "one in my bellybutton" (11/07/2012)   INGUINAL HERNIA REPAIR  Right    "maybe 2" (11/07/2012)   KNEE ARTHROSCOPY Right    LEFT HEART CATH AND CORONARY ANGIOGRAPHY N/A 09/25/2020   Procedure: LEFT HEART CATH AND CORONARY ANGIOGRAPHY;  Surgeon: Sherren Mocha, MD;  Location: [place] [facility];  Service: Cardiovascular;  Laterality: N/A;   LITHOTRIPSY     "several times" (11/07/2012)   OPEN REDUCTION INTERNAL FIXATION (ORIF) DISTAL RADIAL FRACTURE Right 11/07/2012   Procedure: OPEN REDUCTION INTERNAL FIXATION (ORIF) RIGHT DISTAL RADIAL FRACTURE;  Surgeon: [name], MD;  Location: [facility];  Service: Orthopedics;  Laterality: Right;   ORIF DISTAL RADIUS FRACTURE Right 11/07/2012   OTHER SURGICAL HISTORY  08/14/2016   5 organ transplant- liver, stomach, pancreas, small and large intestine   UPPER GASTROINTESTINAL ENDOSCOPY       OB History     Gravida  1   Para  1   Term  1   Preterm      AB      Living         SAB      IAB      Ectopic  Multiple      Live Births              Family History  Problem Relation Age of Onset   Dementia Mother    Heart disease Father    Liver disease Father    Hypertension Father    Diabetes Father    Dementia Father    Hypertension Brother     Social History   Tobacco Use   Smoking status: Former    Packs/day: 0.50    Years: 5.00    Pack years: 2.50    Types: Cigarettes    Quit date: 05/02/1991    Years since quitting: 29.4   Smokeless tobacco: Never  Vaping Use   Vaping Use: Never used  Substance Use Topics   Alcohol use: No    Alcohol/week: 0.0 standard drinks   Drug use: No    Home Medications Prior to Admission medications   Medication Sig Start Date End Date Taking? Authorizing Provider  aspirin EC 81 MG tablet Take 1 tablet (81 mg total) by mouth daily. Swallow whole. 05/14/20   Satira Sark, MD  atenolol (TENORMIN) 25 MG tablet Take 25 mg by mouth 2 (two) times daily.    [provider]  calcium-vitamin D (OSCAL WITH D) 500-200 MG-UNIT tablet Take 2  tablets by mouth 2 (two) times daily.    [provider]  clopidogrel (PLAVIX) 75 MG tablet Take 1 tablet (75 mg total) by mouth daily. 09/25/20 09/25/21  Sherren Mocha, MD  cycloSPORINE (SANDIMMUNE) 100 MG capsule Take 100 mg by mouth daily.    [provider]  cycloSPORINE (SANDIMMUNE) 25 MG capsule Take 75 mg by mouth at bedtime.    [provider]  diphenhydrAMINE (BENADRYL) 50 MG tablet Take one tablet by mouth prior to heart cath with prednisone 09/24/20   Sherren Mocha, MD  fluvastatin (LESCOL) 20 MG capsule Take 1 capsule (20 mg total) by mouth at bedtime. 05/15/20   Satira Sark, MD  furosemide (LASIX) 20 MG tablet Take 20 mg by mouth every Monday, Wednesday, and Friday. 09/18/20   [provider]  isosorbide mononitrate (IMDUR) 30 MG 24 hr tablet Take daily 15 mg am and 30 mg at bedtime Patient taking differently: Take 15-30 mg by mouth See admin instructions. Take daily 15 mg am and 30 mg at bedtime 09/22/20   Satira Sark, MD  Multiple Vitamins-Minerals (SUPER THERA VITE M PO) Take 1 tablet by mouth daily.    [provider]  mycophenolate (CELLCEPT) 250 MG capsule Take 250-500 mg by mouth See admin instructions. 500 mg in the morning. 250 mg in the evening    [provider]  nitroGLYCERIN (NITROSTAT) 0.4 MG SL tablet Place 1 tablet (0.4 mg total) under the tongue every 5 (five) minutes as needed. 06/11/20   Satira Sark, MD  Oxycodone HCl 10 MG TABS Take 10 mg by mouth every 6 (six) hours as needed (pain).    [provider]  pantoprazole (PROTONIX) 40 MG tablet Take 40 mg by mouth at bedtime. 09/17/20   [provider]  predniSONE (DELTASONE) 50 MG tablet Take one tablet by mouth on 09/24/20 at 11:30 pm, 09/25/20 at 5:00 am, and before you leave home 09/24/20   Sherren Mocha, MD  traZODone (DESYREL) 50 MG tablet Take 100 mg by mouth at bedtime. 09/16/20   [provider]  Vitamin D,  Ergocalciferol, (DRISDOL) 50000 units CAPS capsule Take 50,000 Units by mouth every  7 (seven) days. Take on Friday.    [provider]    Allergies    Ancef [cefazolin sodium], Tapentadol, Gadobenate, Iodinated diagnostic agents, and Tape  Review of Systems   Review of Systems  Constitutional:  Negative for chills, fatigue and fever.  Respiratory:  Negative for cough and shortness of breath.   Cardiovascular:  Negative for chest pain and leg swelling.  Gastrointestinal:  Positive for abdominal pain. Negative for blood in stool, nausea and vomiting.  Genitourinary:  Positive for hematuria. Negative for dysuria and flank pain.  Musculoskeletal:  Negative for arthralgias, back pain, myalgias, neck pain and neck stiffness.  Skin:  Negative for rash.  Neurological:  Negative for dizziness, weakness, numbness and headaches.  Hematological:  Does not bruise/bleed easily.   Physical Exam Updated Vital Signs BP 130/79 (BP Location: Right Arm)   Pulse (!) 57   Temp 97.9 F (36.6 C) (Oral)   Resp 18   Ht 5\' 5"  (1.651 m)   Wt 99.8 kg   SpO2 97%   BMI 36.61 kg/m   Physical Exam Vitals and nursing note reviewed.  Constitutional:      Appearance: Normal appearance. She is not ill-appearing or toxic-appearing.  HENT:     Head: Normocephalic.     Mouth/Throat:     Mouth: Mucous membranes are moist.  Neck:     Thyroid: No thyromegaly.     Meningeal: Kernig's sign absent.  Cardiovascular:     Rate and Rhythm: Normal rate and regular rhythm.     Pulses: Normal pulses.  Pulmonary:     Effort: Pulmonary effort is normal. No respiratory distress.     Breath sounds: Normal breath sounds. No wheezing.  Abdominal:     Palpations: Abdomen is soft.     Tenderness: There is abdominal tenderness. There is no right CVA tenderness, left CVA tenderness, guarding or rebound.     Comments: Diffuse tenderness to palpation of the lower abdomen.  Abdomen soft no guarding or rebound tenderness  no CVA tenderness.  Musculoskeletal:        General: Normal range of motion.     Cervical back: Normal range of motion and neck supple.     Right lower leg: No edema.     Left lower leg: No edema.  Skin:    General: Skin is warm.     Capillary Refill: Capillary refill takes less than 2 seconds.     Findings: No rash.  Neurological:     General: No focal deficit present.     Mental Status: She is alert.     Sensory: No sensory deficit.     Motor: No weakness.    ED Results / Procedures / Treatments   Labs (all labs ordered are listed, but only abnormal results are displayed) Labs Reviewed  URINALYSIS, ROUTINE W REFLEX MICROSCOPIC - Abnormal; Notable for the following components:      Result Value   APPearance TURBID (*)    Hgb urine dipstick LARGE (*)    Protein, ur 30 (*)    Nitrite POSITIVE (*)    Leukocytes,Ua MODERATE (*)    All other components within normal limits  URINALYSIS, MICROSCOPIC (REFLEX) - Abnormal; Notable for the following components:   Bacteria, UA MANY (*)    Non Squamous Epithelial PRESENT (*)    All other components within normal limits    EKG None  Radiology  CT Renal Stone Study  Result Date: 10/08/2020 CLINICAL DATA:  60 year old female with  history of lower abdominal pain and hematuria. EXAM: CT ABDOMEN AND PELVIS WITHOUT CONTRAST TECHNIQUE: Multidetector CT imaging of the abdomen and pelvis was performed following the standard protocol without IV contrast. COMPARISON:  CT of the abdomen and pelvis 03/11/2017. FINDINGS: Lower chest: Mild cardiomegaly. Hepatobiliary: Status post liver transplant. No suspicious cystic or solid hepatic lesions are confidently identified on today's noncontrast CT examination. Liver has a slightly nodular contour, which may suggest underlying cirrhosis. Gallbladder is not confidently identified, presumably surgically absent (no surgical clips are noted in the gallbladder fossa). Pancreas: Unusual appearance/position of the  pancreas related to reported pancreatic transplant. No definite pancreatic mass or peripancreatic fluid collections or inflammatory changes are noted on today's noncontrast CT examination. Spleen: Large splenule in the left upper quadrant of the abdomen. Spleen otherwise appears to be surgically absent. Adrenals/Urinary Tract: Multiple nonobstructive calculi are noted within the collecting systems of both kidneys measuring up to 9 mm in the upper pole collecting system of the right kidney. No definite ureteral or bladder stones. No hydroureteronephrosis. Kidneys are moderately atrophic bilaterally. Unenhanced appearance of the urinary bladder is unremarkable. The bilateral adrenal glands are normal in appearance. Stomach/Bowel: Per medical records, the patient is status post transplant of the stomach, pancreas, small intestine and portion of the large intestine. Within the limitations of today's noncontrast CT examination, the unenhanced appearance of the stomach is normal. There is no pathologic dilatation of small bowel or colon. Numerous colonic diverticulae are noted, most evident in the sigmoid colon, where there is some focal mural thickening and surrounding inflammatory changes in the proximal sigmoid colon, which could indicate acute diverticulitis. Vascular/Lymphatic: Atherosclerosis in the abdominal aorta. No lymphadenopathy noted in the abdomen or pelvis. Reproductive: Status post hysterectomy. Ovaries are not confidently identified may be surgically absent or atrophic. Trace amount of gas in the vaginal apex. Other: No significant volume of ascites.  No pneumoperitoneum. Musculoskeletal: There are no aggressive appearing lytic or blastic lesions noted in the visualized portions of the skeleton. IMPRESSION: 1. Colonic diverticulosis, most evident in the sigmoid colon where there is some focal mural thickening and subtle surrounding inflammatory changes concerning for early or mild acute diverticulitis. 2.  Multiple nonobstructive calculi are noted in the collecting systems of both kidneys measuring up to 9 mm in the upper pole collecting system of the right kidney. No ureteral stones or findings of urinary tract obstruction. 3. Status post multi organ transplant (liver, pancreas, stomach, small bowel and portion of the colon). The liver has a slightly nodular contour, concerning for developing cirrhosis. 4. Aortic atherosclerosis. 5. Mild cardiomegaly. 6. Additional incidental findings, as above. Electronically Signed   By: Vinnie Langton M.D.   On: 10/08/2020 20:46     Procedures Procedures   Medications Ordered in ED Medications  cefTRIAXone (ROCEPHIN) 1 g in sodium chloride 0.9 % 100 mL IVPB (0 g Intravenous Stopped 10/08/20 1959)  fentaNYL (SUBLIMAZE) injection 50 mcg (50 mcg Intravenous Given 10/08/20 1858)  ondansetron (ZOFRAN) injection 4 mg (4 mg Intravenous Given 10/08/20 2056)  oxyCODONE-acetaminophen (PERCOCET/ROXICET) 5-325 MG per tablet 1 tablet (1 tablet Oral Given 10/08/20 2056)    ED Course  I have reviewed the triage vital signs and the nursing notes.  Pertinent labs & imaging results that were available during my care of the patient were reviewed by me and considered in my medical decision making (see chart for details).    MDM Rules/Calculators/A&P  Patient here for evaluation of lower abdominal pain and hematuria.  History of multiorgan transplant.  No fever chills or vomiting.  Patient was scheduled for renal ultrasound today, but came to emergency room for evaluation of current symptoms.  On exam, patient nontoxic-appearing she has some mild diffuse tenderness of her lower abdomen.  No CVA tenderness.  No clinical concerns for sepsis.  Will obtain labs and CT abdomen pelvis.  On recheck, patient resting comfortably, pain improving.  Labs interpreted by me, chemistries show elevated BUN and serum creatinine.  Similar to baseline.  Transaminases  unremarkable.  No leukocytosis.  Hemoglobin also near baseline.  Urinalysis show moderate leukocytes, nitrite positive and hematuria.  Many bacteria noted.  Urine culture pending.  Patient to be given Rocephin here.  CT abdomen pelvis shows likely developing diverticulitis, no evidence of ureteral stone or obstruction. No diverticular abscess or perforation.  Findings discussed with patient.  No clinical concern for sepsis.  Patient reports feeling better and ready for discharge home I feel this is appropriate although given strict return precautions.  Will cover with Cipro and Flagyl.  Patient agreeable to plan and will follow-up closely with PCP and nephrologist  Final Clinical Impression(s) / ED Diagnoses Final diagnoses:  Acute cystitis with hematuria  Diverticulitis    Rx / DC Orders ED Discharge Orders     None        Kem Parkinson, PA-C 10/12/20 1636    Kommor, Debe Coder, MD 10/13/20 [phone]

## 2020-10-08 NOTE — ED Triage Notes (Signed)
Pt presents to ED with complaints of blood in her urine since yesterday evening.

## 2020-10-08 NOTE — Telephone Encounter (Signed)
-----   Message from Satira Sark, MD sent at 10/08/2020  8:05 AM EDT ----- Results reviewed.  See recent chart communication regarding hematuria on Plavix.  Please let her know that actually her renal function looks better, creatinine down to 1.82, potassium 5.1.  UA shows protein and also WBCs and positive nitrate in addition to RBCs.  Question whether she has a UTI.  Was this sent for culture?  Can you please check with our pharmacy team regarding what reasonable empiric antibiotic choice we might pick given her immunosuppressive therapy and drug allergies?

## 2020-10-08 NOTE — Telephone Encounter (Signed)
Spoke with pt who states that she is having Abd pain and dark urine. Encouraged pt to be seen in the ER. Pt declines at this time. UA result note sent to Pharm D for advise on antibiotic therapy. Will forward to DOD. Please advise.

## 2020-10-08 NOTE — Discharge Instructions (Addendum)
Your work-up today shows that you have a urinary tract infection and diverticulitis.  You have been given antibiotics here and prescriptions for 2 additional antibiotics to take for 7 days.  It is important that you take these medications as directed until they are finished.  Please follow-up with your transplant providers at [facility] and with your primary care provider next week to have your urine test rechecked.  Return to the emergency department for any new or worsening symptoms.

## 2020-10-08 NOTE — Telephone Encounter (Signed)
I just spoke with patient and now she c/o "black" urine and chills. I advised her top go to the ED and she agrees. She will have her husband take her to [facility] ED.

## 2020-10-08 NOTE — Telephone Encounter (Signed)
Will forward to Pharm-D for advice.

## 2020-10-08 NOTE — Telephone Encounter (Signed)
I spoke with patient who now states her urine is "black" and she has chills. I advised her to go to the ED and she agrees. Her husband is on the way home to take her to [facility] ED

## 2020-10-10 LAB — URINE CULTURE

## 2020-10-12 NOTE — Progress Notes (Signed)
Cardiology Office Note  Date: 10/12/2020   ID: [name], [name] 10-Nov-1960, MRN [id number]  PCP:  Redmond School, MD  Cardiologist:  Rozann Lesches, MD Electrophysiologist:  None   Chief Complaint: Follow-up post cardiac catheterization  History of Present Illness: [name] is a 60 y.o. female with a history of CAD, CKD, esophageal varices, gastric ulcer, portal hypertensive enteropathy.  She was last seen by Dr. Domenic Russell on 09/22/2020 for follow-up.  Despite medical therapy she had progressive exertional fatigue and lack of stamina with intermittent chest tightness.  She was being managed for ischemic heart disease based on abnormal Lexiscan Myoview with moderate peri-infarct ischemia in LAD distribution and EF of greater than 65%.  Cardiac catheterization had not been pursued given history of CKD stage IV and prior multivisceral transplant including liver, pancreas, small bowel 2018 at [facility] on immunosuppressive therapy.  Up titration of Imdur was discussed.  Plan was to have Dr. Burt Russell see her for interventional cardiology opinion regarding feasibility of PCI.  Her creatinine was 1.96 on review of recent lab work.  She continued reporting progressive angina and lack of stamina in spite of medical therapy.  Plan was to increase Imdur to 15 mg a.m. and 30 mg p.m.  She was continuing immunosuppressant therapy for previous liver, pancreas, and small bowel transplant in 2018 at [facility].  She saw Dr. Burt Russell on 09/24/2020.  Dr. Burt Russell suspected she had critical coronary stenosis.  Plan was for cardiac catheterization.  Cardiac catheterization on 09/25/2020 revealed mid LAD lesion 100% stenosis.  Proximal RCA lesion 40% stenosis.  Plan was to load with clopidogrel and refer for consideration of CTO intervention. CTO intervention scheduled for 11/04/2020 Dr. Martinique  She recently noticed hematuria and abdominal pain.  Urinalysis CBC, urine culture, CMP, renal stone study were ordered.  She  presented to [facility] emergency department on 10/08/2020.  She was diagnosed with acute cystitis with hematuria and diverticulitis. She was treated with Cipro, Fluconazole, and Metronidazole.  She is here for follow-up after recent cardiac catheterization.  She states she has a significant amount of fatigue.  She states sometimes when she raises her arms up during her shower she has some chest tightness.  She also has some occasional fluttering sensation in her heart.  Otherwise she denies any current anginal symptoms or nitroglycerin use.  Denies any complications from a cardiac catheterization and states right radial access site looks good.  Blood pressure today 115/78 heart rate of 61.  Denies any issues after starting Plavix.  Current cardiac regimen includes aspirin 81 mg daily, atenolol 25 mg p.o. twice daily, Plavix 75 mg daily, Lasix every Monday, Wednesday, Friday.  Imdur 15 mg a.m. and 30 mg p.m.  Sublingual nitroglycerin as needed, Lescol 20 mg daily.  She is scheduled for CTO intervention 11/04/2020 Dr. Martinique.  She continues Cipro, fluconazole and metronidazole for UTI and diverticulitis.  Recent renal function on 10/08/2020 with creatinine 1.91 and GFR of 30.    Past Medical History:  Diagnosis Date   Anemia    Anxiety    Ascites    CKD (chronic kidney disease) stage 4, GFR 15-29 ml/min (HCC)    Depression    Enteropathy 07/31/2015   Portal hypertensive enteropathy per capsule study, with stigmata of bleeding.   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   Type 2 diabetes mellitus ([place])     Past Surgical History:  Procedure Laterality Date   ABDOMINAL HYSTERECTOMY  2003   [place]   COLONOSCOPY     COLONOSCOPY  10/21/2011   Procedure: COLONOSCOPY;  Surgeon: Rogene Houston, MD;  [facility];  Service: Endoscopy;  Laterality: N/A;  [place]  12/08/2011   Procedure: CYSTOSCOPY WITH RETROGRADE PYELOGRAM;  Surgeon: Marissa Nestle, MD;  Location: [facility] ORS;  Service: Urology;  Laterality: Right;   CYSTOSCOPY WITH STENT PLACEMENT  12/08/2011   Procedure: CYSTOSCOPY WITH STENT PLACEMENT;  Surgeon: Marissa Nestle, MD;  Location: [facility];  Service: Urology;  Laterality: Right;   DILATION AND CURETTAGE OF UTERUS     ESOPHAGOGASTRODUODENOSCOPY N/A 02/19/2014   Procedure: ESOPHAGOGASTRODUODENOSCOPY (EGD);  Surgeon: Rogene Houston, MD;  [facility]: [facility];  Service: Endoscopy;  Laterality: N/A;  100   ESOPHAGOGASTRODUODENOSCOPY N/A 07/15/2015   Procedure: ESOPHAGOGASTRODUODENOSCOPY (EGD);  Surgeon: Rogene Houston, MD;  [facility];  Service: Endoscopy;  Laterality: N/A;   ESOPHAGOGASTRODUODENOSCOPY N/A 07/29/2015   Procedure: ESOPHAGOGASTRODUODENOSCOPY (EGD);  Surgeon: Rogene Houston, MD;  [facility];  Service: Endoscopy;  Laterality: N/A;   GASTRIC BYPASS  ~ [place]  04/14/11   "one in my bellybutton" (11/07/2012)   INGUINAL HERNIA REPAIR Right    "maybe 2" (11/07/2012)   KNEE ARTHROSCOPY Right    LEFT HEART CATH AND CORONARY ANGIOGRAPHY N/A 09/25/2020   Procedure: LEFT HEART CATH AND CORONARY ANGIOGRAPHY;  Surgeon: Sherren Mocha, MD;  Location: [place] [facility];  Service: Cardiovascular;  Laterality: N/A;   LITHOTRIPSY     "several times" (11/07/2012)   OPEN REDUCTION INTERNAL FIXATION (ORIF) DISTAL RADIAL FRACTURE Right 11/07/2012    Procedure: OPEN REDUCTION INTERNAL FIXATION (ORIF) RIGHT DISTAL RADIAL FRACTURE;  Surgeon: [name], MD;  Location: [place];  Service: Orthopedics;  Laterality: Right;   ORIF DISTAL RADIUS FRACTURE Right 11/07/2012   OTHER SURGICAL HISTORY  08/14/2016   5 organ transplant- liver, stomach, pancreas, small and large intestine   UPPER GASTROINTESTINAL ENDOSCOPY      Current Outpatient Medications  Medication Sig Dispense Refill   aspirin EC 81 MG tablet Take 1 tablet (81 mg total) by mouth daily. Swallow whole. 90 tablet 3   atenolol (TENORMIN) 25 MG tablet Take 25 mg by mouth 2 (two) times daily.     calcium-vitamin D (OSCAL WITH D) 500-200 MG-UNIT tablet Take 2 tablets by mouth 2 (two) times daily.     ciprofloxacin (CIPRO) 500 MG tablet Take 1 tablet (500 mg total) by mouth 2 (two) times daily. 14 tablet 0   clopidogrel (PLAVIX) 75 MG tablet Take 1 tablet (75 mg total) by mouth daily. 30 tablet 11   cycloSPORINE (SANDIMMUNE) 100 MG capsule Take 100 mg by mouth daily.     cycloSPORINE (SANDIMMUNE) 25 MG capsule Take 75 mg by mouth at bedtime.  diphenhydrAMINE (BENADRYL) 50 MG tablet Take one tablet by mouth prior to heart cath with prednisone 1 tablet 0   fluconazole (DIFLUCAN) 150 MG tablet Take as single dose after completion of antibiotics.  You may repeat in 7 days if needed. 1 tablet 1   fluvastatin (LESCOL) 20 MG capsule Take 1 capsule (20 mg total) by mouth at bedtime. 90 capsule 2   furosemide (LASIX) 20 MG tablet Take 20 mg by mouth every Monday, Wednesday, and Friday.     isosorbide mononitrate (IMDUR) 30 MG 24 hr tablet Take daily 15 mg am and 30 mg at bedtime (Patient taking differently: Take 15-30 mg by mouth See admin instructions. Take daily 15 mg am and 30 mg at bedtime) 135 tablet 3   metroNIDAZOLE (FLAGYL) 500 MG tablet Take 1 tablet (500 mg total) by mouth 2 (two) times daily. 14 tablet 0   Multiple Vitamins-Minerals (SUPER THERA VITE M PO) Take 1 tablet by mouth  daily.     mycophenolate (CELLCEPT) 250 MG capsule Take 250-500 mg by mouth See admin instructions. 500 mg in the morning. 250 mg in the evening     nitroGLYCERIN (NITROSTAT) 0.4 MG SL tablet Place 1 tablet (0.4 mg total) under the tongue every 5 (five) minutes as needed. 25 tablet 3   ondansetron (ZOFRAN) 4 MG tablet Take 1 tablet (4 mg total) by mouth every 6 (six) hours. As needed for nausea/vomiting 12 tablet 0   Oxycodone HCl 10 MG TABS Take 10 mg by mouth every 6 (six) hours as needed (pain).     pantoprazole (PROTONIX) 40 MG tablet Take 40 mg by mouth at bedtime.     predniSONE (DELTASONE) 50 MG tablet Take one tablet by mouth on 09/24/20 at 11:30 pm, 09/25/20 at 5:00 am, and before you leave home 3 tablet 0   traZODone (DESYREL) 50 MG tablet Take 100 mg by mouth at bedtime.     Vitamin D, Ergocalciferol, (DRISDOL) 50000 units CAPS capsule Take 50,000 Units by mouth every 7 (seven) days. Take on Friday.     No current facility-administered medications for this visit.   Allergies:  Ancef [cefazolin sodium], Tapentadol, Gadobenate, Iodinated diagnostic agents, and Tape   Social History: The patient  reports that she quit smoking about 29 years ago. Her smoking use included cigarettes. She has a 2.50 pack-year smoking history. She has never used smokeless tobacco. She reports that she does not drink alcohol and does not use drugs.   Family History: The patient's family history includes Dementia in her father and mother; Diabetes in her father; Heart disease in her father; Hypertension in her brother and father; Liver disease in her father.   ROS:  Please see the history of present illness. Otherwise, complete review of systems is positive for none.  All other systems are reviewed and negative.   Physical Exam: VS:  There were no vitals taken for this visit., BMI There is no height or weight on file to calculate BMI.  Wt Readings from Last 3 Encounters:  10/08/20 220 lb (99.8 kg)  09/25/20  226 lb (102.5 kg)  09/24/20 226 lb 6.4 oz (102.7 kg)    General: Patient appears comfortable at rest. Neck: Supple, no elevated JVP or carotid bruits, no thyromegaly. Lungs: Clear to auscultation, nonlabored breathing at rest. Cardiac: Regular rate and rhythm, no S3 or significant systolic murmur, no pericardial rub. Extremities: No pitting edema, distal pulses 2+. Skin: Warm and dry.  Right radial cath site clean and  dry with 2+ pulse Musculoskeletal: No kyphosis. Neuropsychiatric: Alert and oriented x3, affect grossly appropriate.  ECG:  EKG September 24, 2020 sinus bradycardia rate of 50.  Recent Labwork: 10/08/2020: ALT 9; AST 11; BUN 24; Creatinine, Ser 1.91; Hemoglobin 11.3; Platelets 284; Potassium 4.3; Sodium 137     Component Value Date/Time   CHOL 136 08/27/2020 0849   TRIG 101 08/27/2020 0849   HDL 39 (L) 08/27/2020 0849   CHOLHDL 3.5 08/27/2020 0849   LDLCALC 78 08/27/2020 0849    Other Studies Reviewed Today:     Cardiac catheterization 09/25/2020 Procedures  LEFT HEART CATH AND CORONARY ANGIOGRAPHY   Conclusion      Mid LAD lesion is 100% stenosed.   Prox RCA lesion is 40% stenosed.   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  Refer for consideration of CTO intervention.  Postprocedural fluids.  Recheck metabolic panel next week as an outpatient.   Diagnostic Dominance: Right         Echocardiogram 05/14/2020   1. Left ventricular ejection fraction, by estimation, is 60 to 65%. The  left ventricle has normal function. The left ventricle has no regional  wall motion abnormalities. There is mild left ventricular hypertrophy.  Left ventricular diastolic parameters  were normal. Normal global longitudinal strain of -19.1%.   2. Right ventricular systolic function is normal. The right ventricular  size is  normal. There is normal pulmonary artery systolic pressure. The  estimated right ventricular systolic pressure is 36.6 mmHg.   3. Left atrial size was mildly dilated.   4. The mitral valve is grossly normal. Mild mitral valve regurgitation.   5. The aortic valve is tricuspid. Aortic valve regurgitation is not  visualized.   6. The inferior vena cava is normal in size with greater than 50%  respiratory variability, suggesting right atrial pressure of 3 mmHg.   Comparison(s): Echocardiogram done 02/27/17 ay [facility] showed an EF of 56%.    Assessment and Plan:  1. Coronary artery disease involving native coronary artery of native heart with angina pectoris ([place])   2. CKD (chronic kidney disease), stage IV ([place])   3. Status post liver transplant ([place])    1. Coronary artery disease involving native coronary artery of native heart with angina pectoris [facility]) Recent cardiac catheterization demonstrated  CTO of mid LAD 100%.  She has collateral blood flow via septal perforators to LAD from right PDA.  Currently denies any anginal symptoms but states she has some chest tightness when she takes a shower and raises her arms above her head.  Also occasional fluttering in her chest.  She has a scheduled CTO intervention by Dr. Martinique on 11/04/2020.  Continue aspirin 81 mg daily, Plavix 75 mg daily, atenolol 25 mg p.o. twice daily, Lasix every Monday, Wednesday, Friday, Imdur 15 mg in a.m. and 30 mg in p.m.,  Sublingual nitroglycerin.  2. CKD (chronic kidney disease), stage IV (HCC) Recent creatinine 1.91 and GFR 30.  3. Status post liver / pancreas / small bowel transplant [facility]),  Previous multivisceral transplant including liver, pancreas, small bowel, colon, stomach per patient in July 2018 [facility].  On immunosuppressant therapy  Medication Adjustments/Labs and Tests Ordered: Current medicines are reviewed at length with the patient today.  Concerns regarding medicines are outlined above.   Disposition:  Follow-up with Dr. Domenic Russell at scheduled visit.  Signed, Levell July, NP 10/12/2020  7:56 PM    [facility] [place], [place], [place] [address] Phone: [phone]; Fax: [phone]

## 2020-10-12 NOTE — H&P (View-Only) (Signed)
Cardiology Office Note  Date: 10/12/2020   ID: [name] Aug 01, 1960, MRN [id number]  PCP:  Redmond School, MD  Cardiologist:  Rozann Lesches, MD Electrophysiologist:  None   Chief Complaint: Follow-up post cardiac catheterization  History of Present Illness: [name] is a 60 y.o. female with a history of CAD, CKD, esophageal varices, gastric ulcer, portal hypertensive enteropathy.  She was last seen by Dr. Domenic Polite on 09/22/2020 for follow-up.  Despite medical therapy she had progressive exertional fatigue and lack of stamina with intermittent chest tightness.  She was being managed for ischemic heart disease based on abnormal Lexiscan Myoview with moderate peri-infarct ischemia in LAD distribution and EF of greater than 65%.  Cardiac catheterization had not been pursued given history of CKD stage IV and prior multivisceral transplant including liver, pancreas, small bowel 2018 at [facility] on immunosuppressive therapy.  Up titration of Imdur was discussed.  Plan was to have Dr. Burt Knack see her for interventional cardiology opinion regarding feasibility of PCI.  Her creatinine was 1.96 on review of recent lab work.  She continued reporting progressive angina and lack of stamina in spite of medical therapy.  Plan was to increase Imdur to 15 mg a.m. and 30 mg p.m.  She was continuing immunosuppressant therapy for previous liver, pancreas, and small bowel transplant in 2018 at [facility].  She saw Dr. Burt Knack on 09/24/2020.  Dr. Burt Knack suspected she had critical coronary stenosis.  Plan was for cardiac catheterization.  Cardiac catheterization on 09/25/2020 revealed mid LAD lesion 100% stenosis.  Proximal RCA lesion 40% stenosis.  Plan was to load with clopidogrel and refer for consideration of CTO intervention. CTO intervention scheduled for 11/04/2020 Dr. Martinique  She recently noticed hematuria and abdominal pain.  Urinalysis CBC, urine culture, CMP, renal stone study were ordered.  She  presented to [facility] emergency department on 10/08/2020.  She was diagnosed with acute cystitis with hematuria and diverticulitis. She was treated with Cipro, Fluconazole, and Metronidazole.  She is here for follow-up after recent cardiac catheterization.  She states she has a significant amount of fatigue.  She states sometimes when she raises her arms up during her shower she has some chest tightness.  She also has some occasional fluttering sensation in her heart.  Otherwise she denies any current anginal symptoms or nitroglycerin use.  Denies any complications from a cardiac catheterization and states right radial access site looks good.  Blood pressure today 115/78 heart rate of 61.  Denies any issues after starting Plavix.  Current cardiac regimen includes aspirin 81 mg daily, atenolol 25 mg p.o. twice daily, Plavix 75 mg daily, Lasix every Monday, Wednesday, Friday.  Imdur 15 mg a.m. and 30 mg p.m.  Sublingual nitroglycerin as needed, Lescol 20 mg daily.  She is scheduled for CTO intervention 11/04/2020 Dr. Martinique.  She continues Cipro, fluconazole and metronidazole for UTI and diverticulitis.  Recent renal function on 10/08/2020 with creatinine 1.91 and GFR of 30.    Past Medical History:  Diagnosis Date   Anemia    Anxiety    Ascites    CKD (chronic kidney disease) stage 4, GFR 15-29 ml/min (HCC)    Depression    Enteropathy 07/31/2015   Portal hypertensive enteropathy per capsule study, with stigmata of bleeding.   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   Type 2 diabetes mellitus ([place])     Past Surgical History:  Procedure Laterality Date   ABDOMINAL HYSTERECTOMY  2003   [place]   COLONOSCOPY     COLONOSCOPY  10/21/2011   Procedure: COLONOSCOPY;  Surgeon: Rogene Houston, MD;  [facility];  Service: Endoscopy;  Laterality: N/A;  [place]  12/08/2011   Procedure: CYSTOSCOPY WITH RETROGRADE PYELOGRAM;  Surgeon: Marissa Nestle, MD;  Location: [facility] ORS;  Service: Urology;  Laterality: Right;   CYSTOSCOPY WITH STENT PLACEMENT  12/08/2011   Procedure: CYSTOSCOPY WITH STENT PLACEMENT;  Surgeon: Marissa Nestle, MD;  Location: [facility] ORS;  Service: Urology;  Laterality: Right;   DILATION AND CURETTAGE OF UTERUS     ESOPHAGOGASTRODUODENOSCOPY N/A 02/19/2014   Procedure: ESOPHAGOGASTRODUODENOSCOPY (EGD);  Surgeon: Rogene Houston, MD;  Location: [facility];  Service: Endoscopy;  Laterality: N/A;  100   ESOPHAGOGASTRODUODENOSCOPY N/A 07/15/2015   Procedure: ESOPHAGOGASTRODUODENOSCOPY (EGD);  Surgeon: Rogene Houston, MD;  [facility];  Service: Endoscopy;  Laterality: N/A;   ESOPHAGOGASTRODUODENOSCOPY N/A 07/29/2015   Procedure: ESOPHAGOGASTRODUODENOSCOPY (EGD);  Surgeon: Rogene Houston, MD;  Location: [facility];  Service: Endoscopy;  Laterality: N/A;   GASTRIC BYPASS  ~ [place]  04/14/11   "one in my bellybutton" (11/07/2012)   INGUINAL HERNIA REPAIR Right    "maybe 2" (11/07/2012)   KNEE ARTHROSCOPY Right    LEFT HEART CATH AND CORONARY ANGIOGRAPHY N/A 09/25/2020   Procedure: LEFT HEART CATH AND CORONARY ANGIOGRAPHY;  Surgeon: Sherren Mocha, MD;  Location: [place] CV LAB;  Service: Cardiovascular;  Laterality: N/A;   LITHOTRIPSY     "several times" (11/07/2012)   OPEN REDUCTION INTERNAL FIXATION (ORIF) DISTAL RADIAL FRACTURE Right 11/07/2012    Procedure: OPEN REDUCTION INTERNAL FIXATION (ORIF) RIGHT DISTAL RADIAL FRACTURE;  Surgeon: [name], MD;  Location: [place];  Service: Orthopedics;  Laterality: Right;   ORIF DISTAL RADIUS FRACTURE Right 11/07/2012   OTHER SURGICAL HISTORY  08/14/2016   5 organ transplant- liver, stomach, pancreas, small and large intestine   UPPER GASTROINTESTINAL ENDOSCOPY      Current Outpatient Medications  Medication Sig Dispense Refill   aspirin EC 81 MG tablet Take 1 tablet (81 mg total) by mouth daily. Swallow whole. 90 tablet 3   atenolol (TENORMIN) 25 MG tablet Take 25 mg by mouth 2 (two) times daily.     calcium-vitamin D (OSCAL WITH D) 500-200 MG-UNIT tablet Take 2 tablets by mouth 2 (two) times daily.     ciprofloxacin (CIPRO) 500 MG tablet Take 1 tablet (500 mg total) by mouth 2 (two) times daily. 14 tablet 0   clopidogrel (PLAVIX) 75 MG tablet Take 1 tablet (75 mg total) by mouth daily. 30 tablet 11   cycloSPORINE (SANDIMMUNE) 100 MG capsule Take 100 mg by mouth daily.     cycloSPORINE (SANDIMMUNE) 25 MG capsule Take 75 mg by mouth at bedtime.  diphenhydrAMINE (BENADRYL) 50 MG tablet Take one tablet by mouth prior to heart cath with prednisone 1 tablet 0   fluconazole (DIFLUCAN) 150 MG tablet Take as single dose after completion of antibiotics.  You may repeat in 7 days if needed. 1 tablet 1   fluvastatin (LESCOL) 20 MG capsule Take 1 capsule (20 mg total) by mouth at bedtime. 90 capsule 2   furosemide (LASIX) 20 MG tablet Take 20 mg by mouth every Monday, Wednesday, and Friday.     isosorbide mononitrate (IMDUR) 30 MG 24 hr tablet Take daily 15 mg am and 30 mg at bedtime (Patient taking differently: Take 15-30 mg by mouth See admin instructions. Take daily 15 mg am and 30 mg at bedtime) 135 tablet 3   metroNIDAZOLE (FLAGYL) 500 MG tablet Take 1 tablet (500 mg total) by mouth 2 (two) times daily. 14 tablet 0   Multiple Vitamins-Minerals (SUPER THERA VITE M PO) Take 1 tablet by mouth  daily.     mycophenolate (CELLCEPT) 250 MG capsule Take 250-500 mg by mouth See admin instructions. 500 mg in the morning. 250 mg in the evening     nitroGLYCERIN (NITROSTAT) 0.4 MG SL tablet Place 1 tablet (0.4 mg total) under the tongue every 5 (five) minutes as needed. 25 tablet 3   ondansetron (ZOFRAN) 4 MG tablet Take 1 tablet (4 mg total) by mouth every 6 (six) hours. As needed for nausea/vomiting 12 tablet 0   Oxycodone HCl 10 MG TABS Take 10 mg by mouth every 6 (six) hours as needed (pain).     pantoprazole (PROTONIX) 40 MG tablet Take 40 mg by mouth at bedtime.     predniSONE (DELTASONE) 50 MG tablet Take one tablet by mouth on 09/24/20 at 11:30 pm, 09/25/20 at 5:00 am, and before you leave home 3 tablet 0   traZODone (DESYREL) 50 MG tablet Take 100 mg by mouth at bedtime.     Vitamin D, Ergocalciferol, (DRISDOL) 50000 units CAPS capsule Take 50,000 Units by mouth every 7 (seven) days. Take on Friday.     No current facility-administered medications for this visit.   Allergies:  Ancef [cefazolin sodium], Tapentadol, Gadobenate, Iodinated diagnostic agents, and Tape   Social History: The patient  reports that she quit smoking about 29 years ago. Her smoking use included cigarettes. She has a 2.50 pack-year smoking history. She has never used smokeless tobacco. She reports that she does not drink alcohol and does not use drugs.   Family History: The patient's family history includes Dementia in her father and mother; Diabetes in her father; Heart disease in her father; Hypertension in her brother and father; Liver disease in her father.   ROS:  Please see the history of present illness. Otherwise, complete review of systems is positive for none.  All other systems are reviewed and negative.   Physical Exam: VS:  There were no vitals taken for this visit., BMI There is no height or weight on file to calculate BMI.  Wt Readings from Last 3 Encounters:  10/08/20 220 lb (99.8 kg)  09/25/20  226 lb (102.5 kg)  09/24/20 226 lb 6.4 oz (102.7 kg)    General: Patient appears comfortable at rest. Neck: Supple, no elevated JVP or carotid bruits, no thyromegaly. Lungs: Clear to auscultation, nonlabored breathing at rest. Cardiac: Regular rate and rhythm, no S3 or significant systolic murmur, no pericardial rub. Extremities: No pitting edema, distal pulses 2+. Skin: Warm and dry.  Right radial cath site clean and  dry with 2+ pulse Musculoskeletal: No kyphosis. Neuropsychiatric: Alert and oriented x3, affect grossly appropriate.  ECG:  EKG September 24, 2020 sinus bradycardia rate of 50.  Recent Labwork: 10/08/2020: ALT 9; AST 11; BUN 24; Creatinine, Ser 1.91; Hemoglobin 11.3; Platelets 284; Potassium 4.3; Sodium 137     Component Value Date/Time   CHOL 136 08/27/2020 0849   TRIG 101 08/27/2020 0849   HDL 39 (L) 08/27/2020 0849   CHOLHDL 3.5 08/27/2020 0849   LDLCALC 78 08/27/2020 0849    Other Studies Reviewed Today:     Cardiac catheterization 09/25/2020 Procedures  LEFT HEART CATH AND CORONARY ANGIOGRAPHY   Conclusion      Mid LAD lesion is 100% stenosed.   Prox RCA lesion is 40% stenosed.   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  Refer for consideration of CTO intervention.  Postprocedural fluids.  Recheck metabolic panel next week as an outpatient.   Diagnostic Dominance: Right         Echocardiogram 05/14/2020   1. Left ventricular ejection fraction, by estimation, is 60 to 65%. The  left ventricle has normal function. The left ventricle has no regional  wall motion abnormalities. There is mild left ventricular hypertrophy.  Left ventricular diastolic parameters  were normal. Normal global longitudinal strain of -19.1%.   2. Right ventricular systolic function is normal. The right ventricular  size is  normal. There is normal pulmonary artery systolic pressure. The  estimated right ventricular systolic pressure is 31.5 mmHg.   3. Left atrial size was mildly dilated.   4. The mitral valve is grossly normal. Mild mitral valve regurgitation.   5. The aortic valve is tricuspid. Aortic valve regurgitation is not  visualized.   6. The inferior vena cava is normal in size with greater than 50%  respiratory variability, suggesting right atrial pressure of 3 mmHg.   Comparison(s): Echocardiogram done 02/27/17 ay [facility] showed an EF of 56%.    Assessment and Plan:  1. Coronary artery disease involving native coronary artery of native heart with angina pectoris ([place])   2. CKD (chronic kidney disease), stage IV ([place])   3. Status post liver transplant ([place])    1. Coronary artery disease involving native coronary artery of native heart with angina pectoris [facility]) Recent cardiac catheterization demonstrated  CTO of mid LAD 100%.  She has collateral blood flow via septal perforators to LAD from right PDA.  Currently denies any anginal symptoms but states she has some chest tightness when she takes a shower and raises her arms above her head.  Also occasional fluttering in her chest.  She has a scheduled CTO intervention by Dr. Martinique on 11/04/2020.  Continue aspirin 81 mg daily, Plavix 75 mg daily, atenolol 25 mg p.o. twice daily, Lasix every Monday, Wednesday, Friday, Imdur 15 mg in a.m. and 30 mg in p.m.,  Sublingual nitroglycerin.  2. CKD (chronic kidney disease), stage IV (HCC) Recent creatinine 1.91 and GFR 30.  3. Status post liver / pancreas / small bowel transplant [facility]),  Previous multivisceral transplant including liver, pancreas, small bowel, colon, stomach per patient in July 2018 [facility].  On immunosuppressant therapy  Medication Adjustments/Labs and Tests Ordered: Current medicines are reviewed at length with the patient today.  Concerns regarding medicines are outlined above.   Disposition:  Follow-up with Dr. Domenic Polite at scheduled visit.  Signed, Levell July, NP 10/12/2020  7:56 PM    [facility] [place], [place], [place] [address] Phone: [phone]; Fax: [phone]

## 2020-10-13 ENCOUNTER — Encounter: Payer: Self-pay | Admitting: Family Medicine

## 2020-10-13 ENCOUNTER — Ambulatory Visit: Payer: Medicare Other | Admitting: Family Medicine

## 2020-10-13 ENCOUNTER — Other Ambulatory Visit: Payer: Self-pay

## 2020-10-13 VITALS — BP 115/78 | HR 61 | Ht 66.0 in | Wt 228.0 lb

## 2020-10-13 DIAGNOSIS — Z944 Liver transplant status: Secondary | ICD-10-CM | POA: Diagnosis not present

## 2020-10-13 DIAGNOSIS — I25119 Atherosclerotic heart disease of native coronary artery with unspecified angina pectoris: Secondary | ICD-10-CM | POA: Diagnosis not present

## 2020-10-13 DIAGNOSIS — N184 Chronic kidney disease, stage 4 (severe): Secondary | ICD-10-CM

## 2020-10-13 NOTE — Patient Instructions (Addendum)
Medication Instructions:  Continue all current medications.   Labwork: none  Testing/Procedures: none  Follow-Up: As planned   Any Other Special Instructions Will Be Listed Below (If Applicable).   If you need a refill on your cardiac medications before your next appointment, please call your pharmacy.

## 2020-10-15 ENCOUNTER — Ambulatory Visit (HOSPITAL_COMMUNITY): Admission: RE | Admit: 2020-10-15 | Payer: Medicare Other | Source: Ambulatory Visit

## 2020-10-15 DIAGNOSIS — G4733 Obstructive sleep apnea (adult) (pediatric): Secondary | ICD-10-CM | POA: Diagnosis not present

## 2020-10-19 DIAGNOSIS — D2260 Melanocytic nevi of unspecified upper limb, including shoulder: Secondary | ICD-10-CM | POA: Diagnosis not present

## 2020-10-19 DIAGNOSIS — D045 Carcinoma in situ of skin of trunk: Secondary | ICD-10-CM | POA: Diagnosis not present

## 2020-10-19 DIAGNOSIS — C44712 Basal cell carcinoma of skin of right lower limb, including hip: Secondary | ICD-10-CM | POA: Diagnosis not present

## 2020-10-19 DIAGNOSIS — L570 Actinic keratosis: Secondary | ICD-10-CM | POA: Diagnosis not present

## 2020-10-19 DIAGNOSIS — L814 Other melanin hyperpigmentation: Secondary | ICD-10-CM | POA: Diagnosis not present

## 2020-10-19 DIAGNOSIS — D492 Neoplasm of unspecified behavior of bone, soft tissue, and skin: Secondary | ICD-10-CM | POA: Diagnosis not present

## 2020-10-23 DIAGNOSIS — E211 Secondary hyperparathyroidism, not elsewhere classified: Secondary | ICD-10-CM | POA: Diagnosis not present

## 2020-10-23 DIAGNOSIS — E872 Acidosis: Secondary | ICD-10-CM | POA: Diagnosis not present

## 2020-10-23 DIAGNOSIS — N189 Chronic kidney disease, unspecified: Secondary | ICD-10-CM | POA: Diagnosis not present

## 2020-10-23 DIAGNOSIS — D472 Monoclonal gammopathy: Secondary | ICD-10-CM | POA: Diagnosis not present

## 2020-10-26 DIAGNOSIS — Z9489 Other transplanted organ and tissue status: Secondary | ICD-10-CM | POA: Diagnosis not present

## 2020-10-26 DIAGNOSIS — G4709 Other insomnia: Secondary | ICD-10-CM | POA: Diagnosis not present

## 2020-10-26 DIAGNOSIS — Z7682 Awaiting organ transplant status: Secondary | ICD-10-CM | POA: Diagnosis not present

## 2020-10-26 DIAGNOSIS — R6889 Other general symptoms and signs: Secondary | ICD-10-CM | POA: Diagnosis not present

## 2020-10-27 DIAGNOSIS — I200 Unstable angina: Secondary | ICD-10-CM | POA: Diagnosis not present

## 2020-10-27 DIAGNOSIS — G894 Chronic pain syndrome: Secondary | ICD-10-CM | POA: Diagnosis not present

## 2020-11-02 ENCOUNTER — Telehealth: Payer: Self-pay | Admitting: *Deleted

## 2020-11-02 MED ORDER — PREDNISONE 50 MG PO TABS: ORAL_TABLET | ORAL | 0 refills | Status: DC

## 2020-11-02 NOTE — Telephone Encounter (Signed)
Coronary Stent Intervention scheduled at [facility] for: Wednesday November 04, 2020 11:30 [facility] Main Entrance [facility]) at: 5:30 AM -pre-procedure hydration   No solid food after midnight prior to cath, clear liquids until 5 AM day of procedure.  CONTRAST ALLERGY: 13 hour Prednisone and Benadryl Prep reviewed with patient: 11/03/20 Prednisone 50 mg 10:30 PM 11/04/20 Prednisone 50 mg 4:30 AM 11/04/20 Prednisone 50 mg and Benadryl 50 mg 9:30 AM at hospital * I have asked patient to take Prednisone 50 mg and Benadryl 50 mg with her to hospital and take at hospital 11/04/20 9:30 AM.  Medication instructions: Hold: -Lasix-day before and day of procedure-GFR 30 -Lisinopril-day before and day of procedure-GFR 30  Except hold medications usual morning medications can be taken pre-cath with sips of water including: - aspirin 81 mg -Plavix 75 mg    Confirmed patient has responsible adult to drive home post procedure and be with patient first 24 hours after arriving home.   You are allowed one visitor in the waiting room during the time you are at the hospital for your procedure. Both you and your visitor must wear a mask once you enter the hospital.   Patient reports does not currently have any symptoms concerning for COVID-19 and no household members with COVID-19 like illness.                          Reviewed procedure/mask/visitor instructions with patient.                      *Patient with hx of multivisceral transplant in 2018 on immunosuppressant medications as part of post transplant treatment.                      Patient will take these medications with her to hospital to have to take if necessary while at hospital.

## 2020-11-04 ENCOUNTER — Encounter (HOSPITAL_COMMUNITY): Payer: Self-pay | Admitting: Interventional Cardiology

## 2020-11-04 ENCOUNTER — Ambulatory Visit (HOSPITAL_COMMUNITY)
Admission: AD | Disposition: A | Discharge status: Home / Self Care | Payer: Medicare Other | Source: Home / Self Care | Attending: Cardiology

## 2020-11-04 ENCOUNTER — Inpatient Hospital Stay (HOSPITAL_COMMUNITY)
Admission: AD | Admit: 2020-11-04 | Discharge: 2020-11-06 | DRG: 982 | Disposition: A | Discharge status: Home / Self Care | Payer: Medicare Other | Attending: Cardiology | Admitting: Cardiology

## 2020-11-04 ENCOUNTER — Other Ambulatory Visit: Payer: Self-pay

## 2020-11-04 DIAGNOSIS — Y840 Cardiac catheterization as the cause of abnormal reaction of the patient, or of later complication, without mention of misadventure at the time of the procedure: Secondary | ICD-10-CM | POA: Diagnosis present

## 2020-11-04 DIAGNOSIS — D849 Immunodeficiency, unspecified: Secondary | ICD-10-CM | POA: Diagnosis present

## 2020-11-04 DIAGNOSIS — Z955 Presence of coronary angioplasty implant and graft: Secondary | ICD-10-CM | POA: Diagnosis not present

## 2020-11-04 DIAGNOSIS — Z9884 Bariatric surgery status: Secondary | ICD-10-CM

## 2020-11-04 DIAGNOSIS — Z833 Family history of diabetes mellitus: Secondary | ICD-10-CM

## 2020-11-04 DIAGNOSIS — I9589 Other hypotension: Secondary | ICD-10-CM | POA: Diagnosis not present

## 2020-11-04 DIAGNOSIS — Z9483 Pancreas transplant status: Secondary | ICD-10-CM | POA: Diagnosis not present

## 2020-11-04 DIAGNOSIS — I25110 Atherosclerotic heart disease of native coronary artery with unstable angina pectoris: Secondary | ICD-10-CM | POA: Diagnosis not present

## 2020-11-04 DIAGNOSIS — N179 Acute kidney failure, unspecified: Secondary | ICD-10-CM | POA: Diagnosis present

## 2020-11-04 DIAGNOSIS — I129 Hypertensive chronic kidney disease with stage 1 through stage 4 chronic kidney disease, or unspecified chronic kidney disease: Secondary | ICD-10-CM | POA: Diagnosis not present

## 2020-11-04 DIAGNOSIS — Z87891 Personal history of nicotine dependence: Secondary | ICD-10-CM | POA: Diagnosis not present

## 2020-11-04 DIAGNOSIS — D62 Acute posthemorrhagic anemia: Secondary | ICD-10-CM | POA: Diagnosis not present

## 2020-11-04 DIAGNOSIS — N141 Nephropathy induced by other drugs, medicaments and biological substances: Secondary | ICD-10-CM | POA: Diagnosis present

## 2020-11-04 DIAGNOSIS — K7460 Unspecified cirrhosis of liver: Secondary | ICD-10-CM | POA: Diagnosis present

## 2020-11-04 DIAGNOSIS — Z7902 Long term (current) use of antithrombotics/antiplatelets: Secondary | ICD-10-CM

## 2020-11-04 DIAGNOSIS — Z7982 Long term (current) use of aspirin: Secondary | ICD-10-CM | POA: Diagnosis not present

## 2020-11-04 DIAGNOSIS — Y92239 Unspecified place in hospital as the place of occurrence of the external cause: Secondary | ICD-10-CM | POA: Diagnosis present

## 2020-11-04 DIAGNOSIS — D84821 Immunodeficiency due to drugs: Secondary | ICD-10-CM | POA: Diagnosis not present

## 2020-11-04 DIAGNOSIS — Z87442 Personal history of urinary calculi: Secondary | ICD-10-CM | POA: Diagnosis not present

## 2020-11-04 DIAGNOSIS — Z881 Allergy status to other antibiotic agents status: Secondary | ICD-10-CM

## 2020-11-04 DIAGNOSIS — Z944 Liver transplant status: Secondary | ICD-10-CM | POA: Diagnosis not present

## 2020-11-04 DIAGNOSIS — Z8249 Family history of ischemic heart disease and other diseases of the circulatory system: Secondary | ICD-10-CM

## 2020-11-04 DIAGNOSIS — K429 Umbilical hernia without obstruction or gangrene: Secondary | ICD-10-CM | POA: Diagnosis not present

## 2020-11-04 DIAGNOSIS — T508X5A Adverse effect of diagnostic agents, initial encounter: Secondary | ICD-10-CM | POA: Diagnosis present

## 2020-11-04 DIAGNOSIS — I2510 Atherosclerotic heart disease of native coronary artery without angina pectoris: Secondary | ICD-10-CM | POA: Diagnosis present

## 2020-11-04 DIAGNOSIS — Z79899 Other long term (current) drug therapy: Secondary | ICD-10-CM | POA: Diagnosis not present

## 2020-11-04 DIAGNOSIS — I9581 Postprocedural hypotension: Secondary | ICD-10-CM | POA: Diagnosis present

## 2020-11-04 DIAGNOSIS — Z529 Donor of unspecified organ or tissue: Secondary | ICD-10-CM | POA: Diagnosis not present

## 2020-11-04 DIAGNOSIS — Z91041 Radiographic dye allergy status: Secondary | ICD-10-CM | POA: Diagnosis not present

## 2020-11-04 DIAGNOSIS — E119 Type 2 diabetes mellitus without complications: Secondary | ICD-10-CM

## 2020-11-04 DIAGNOSIS — I97610 Postprocedural hemorrhage and hematoma of a circulatory system organ or structure following a cardiac catheterization: Secondary | ICD-10-CM | POA: Diagnosis not present

## 2020-11-04 DIAGNOSIS — N184 Chronic kidney disease, stage 4 (severe): Secondary | ICD-10-CM | POA: Diagnosis not present

## 2020-11-04 DIAGNOSIS — Z8711 Personal history of peptic ulcer disease: Secondary | ICD-10-CM

## 2020-11-04 DIAGNOSIS — E1122 Type 2 diabetes mellitus with diabetic chronic kidney disease: Secondary | ICD-10-CM | POA: Diagnosis not present

## 2020-11-04 DIAGNOSIS — K661 Hemoperitoneum: Secondary | ICD-10-CM | POA: Diagnosis not present

## 2020-11-04 DIAGNOSIS — E785 Hyperlipidemia, unspecified: Secondary | ICD-10-CM | POA: Diagnosis present

## 2020-11-04 DIAGNOSIS — I2582 Chronic total occlusion of coronary artery: Secondary | ICD-10-CM | POA: Diagnosis not present

## 2020-11-04 DIAGNOSIS — Z9482 Intestine transplant status: Secondary | ICD-10-CM

## 2020-11-04 DIAGNOSIS — I25119 Atherosclerotic heart disease of native coronary artery with unspecified angina pectoris: Secondary | ICD-10-CM

## 2020-11-04 DIAGNOSIS — D631 Anemia in chronic kidney disease: Secondary | ICD-10-CM | POA: Diagnosis present

## 2020-11-04 DIAGNOSIS — Z91048 Other nonmedicinal substance allergy status: Secondary | ICD-10-CM | POA: Diagnosis not present

## 2020-11-04 DIAGNOSIS — D696 Thrombocytopenia, unspecified: Secondary | ICD-10-CM | POA: Diagnosis present

## 2020-11-04 DIAGNOSIS — Z87828 Personal history of other (healed) physical injury and trauma: Secondary | ICD-10-CM | POA: Diagnosis not present

## 2020-11-04 DIAGNOSIS — Z95820 Peripheral vascular angioplasty status with implants and grafts: Secondary | ICD-10-CM

## 2020-11-04 DIAGNOSIS — T82837A Hemorrhage of cardiac prosthetic devices, implants and grafts, initial encounter: Secondary | ICD-10-CM | POA: Diagnosis not present

## 2020-11-04 HISTORY — PX: CORONARY CTO INTERVENTION: CATH118236

## 2020-11-04 LAB — BASIC METABOLIC PANEL
Anion gap: 6 (ref 5–15)
Anion gap: 4 — ABNORMAL LOW (ref 5–15)
BUN: 29 mg/dL — ABNORMAL HIGH (ref 6–20)
BUN: 31 mg/dL — ABNORMAL HIGH (ref 6–20)
CO2: 17 mmol/L — ABNORMAL LOW (ref 22–32)
CO2: 16 mmol/L — ABNORMAL LOW (ref 22–32)
Calcium: 8.7 mg/dL — ABNORMAL LOW (ref 8.9–10.3)
Calcium: 8.2 mg/dL — ABNORMAL LOW (ref 8.9–10.3)
Chloride: 112 mmol/L — ABNORMAL HIGH (ref 98–111)
Chloride: 115 mmol/L — ABNORMAL HIGH (ref 98–111)
Creatinine, Ser: 1.84 mg/dL — ABNORMAL HIGH (ref 0.44–1.00)
Creatinine, Ser: 2.02 mg/dL — ABNORMAL HIGH (ref 0.44–1.00)
GFR, Estimated: 31 mL/min — ABNORMAL LOW (ref >60)
GFR, Estimated: 28 mL/min — ABNORMAL LOW (ref >60)
Glucose, Bld: 126 mg/dL — ABNORMAL HIGH (ref 70–99)
Glucose, Bld: 277 mg/dL — ABNORMAL HIGH (ref 70–99)
Potassium: 5.4 mmol/L — ABNORMAL HIGH (ref 3.5–5.1)
Potassium: 4.8 mmol/L (ref 3.5–5.1)
Sodium: 135 mmol/L (ref 135–145)
Sodium: 135 mmol/L (ref 135–145)

## 2020-11-04 LAB — CBC
HCT: 29.2 % — ABNORMAL LOW (ref 36.0–46.0)
Hemoglobin: 8.9 g/dL — ABNORMAL LOW (ref 12.0–15.0)
MCH: 30.2 pg (ref 26.0–34.0)
MCHC: 30.5 g/dL (ref 30.0–36.0)
MCV: 99 fL (ref 80.0–100.0)
Platelets: 241 10*3/uL (ref 150–400)
RBC: 2.95 MIL/uL — ABNORMAL LOW (ref 3.87–5.11)
RDW: 13.2 % (ref 11.5–15.5)
WBC: 14.4 10*3/uL — ABNORMAL HIGH (ref 4.0–10.5)
nRBC: 0 % (ref 0.0–0.2)

## 2020-11-04 LAB — POCT ACTIVATED CLOTTING TIME
Activated Clotting Time: 271 seconds
Activated Clotting Time: 271 seconds

## 2020-11-04 SURGERY — CORONARY CTO INTERVENTION
Anesthesia: LOCAL

## 2020-11-04 MED ORDER — SODIUM CHLORIDE 0.9 % WEIGHT BASED INFUSION
3.0000 mL/kg/h | INTRAVENOUS | Status: DC
  Administered 2020-11-04: 3 mL/kg/h via INTRAVENOUS

## 2020-11-04 MED ORDER — MYCOPHENOLATE MOFETIL 250 MG PO CAPS
500.0000 mg | ORAL_CAPSULE | Freq: Every day | ORAL | Status: DC
  Administered 2020-11-05 – 2020-11-06 (×2): 500 mg via ORAL
  Filled 2020-11-04 (×2): qty 2

## 2020-11-04 MED ORDER — ASPIRIN 81 MG PO CHEW: 81.0000 mg | CHEWABLE_TABLET | ORAL | Status: DC

## 2020-11-04 MED ORDER — SODIUM CHLORIDE 0.9 % WEIGHT BASED INFUSION: 1.0000 mL/kg/h | INTRAVENOUS | Status: DC

## 2020-11-04 MED ORDER — SODIUM CHLORIDE 0.9% FLUSH: 3.0000 mL | INTRAVENOUS | Status: DC | PRN

## 2020-11-04 MED ORDER — LISINOPRIL 5 MG PO TABS: 2.5000 mg | ORAL_TABLET | Freq: Every day | ORAL | Status: DC

## 2020-11-04 MED ORDER — NITROGLYCERIN 0.4 MG SL SUBL: 0.4000 mg | SUBLINGUAL_TABLET | SUBLINGUAL | Status: DC | PRN

## 2020-11-04 MED ORDER — SODIUM CHLORIDE 0.9 % IV SOLN: INTRAVENOUS | Status: AC

## 2020-11-04 MED ORDER — ONDANSETRON HCL 4 MG/2ML IJ SOLN
INTRAMUSCULAR | Status: AC
  Filled 2020-11-04: qty 2

## 2020-11-04 MED ORDER — SODIUM CHLORIDE 0.9 % IV SOLN: 250.0000 mL | INTRAVENOUS | Status: DC | PRN

## 2020-11-04 MED ORDER — CLOPIDOGREL BISULFATE 75 MG PO TABS: 75.0000 mg | ORAL_TABLET | ORAL | Status: DC

## 2020-11-04 MED ORDER — NITROGLYCERIN 1 MG/10 ML FOR IR/CATH LAB
INTRA_ARTERIAL | Status: AC
  Filled 2020-11-04: qty 10

## 2020-11-04 MED ORDER — SULFAMETHOXAZOLE-TRIMETHOPRIM 800-160 MG PO TABS: 1.0000 | ORAL_TABLET | Freq: Two times a day (BID) | ORAL | Status: DC

## 2020-11-04 MED ORDER — CLOPIDOGREL BISULFATE 75 MG PO TABS: 75.0000 mg | ORAL_TABLET | Freq: Every day | ORAL | Status: DC

## 2020-11-04 MED ORDER — ONDANSETRON HCL 4 MG/2ML IJ SOLN
4.0000 mg | Freq: Four times a day (QID) | INTRAMUSCULAR | Status: DC | PRN
  Administered 2020-11-04 (×2): 4 mg via INTRAVENOUS
  Filled 2020-11-04: qty 2

## 2020-11-04 MED ORDER — ATENOLOL 25 MG PO TABS: 25.0000 mg | ORAL_TABLET | Freq: Every day | ORAL | Status: DC

## 2020-11-04 MED ORDER — ASPIRIN EC 81 MG PO TBEC
81.0000 mg | DELAYED_RELEASE_TABLET | Freq: Every day | ORAL | Status: DC
  Administered 2020-11-05 – 2020-11-06 (×2): 81 mg via ORAL
  Filled 2020-11-04 (×2): qty 1

## 2020-11-04 MED ORDER — ACETAMINOPHEN 325 MG PO TABS
650.0000 mg | ORAL_TABLET | ORAL | Status: DC | PRN
  Administered 2020-11-04: 650 mg via ORAL
  Filled 2020-11-04: qty 2

## 2020-11-04 MED ORDER — LIDOCAINE HCL (PF) 1 % IJ SOLN
INTRAMUSCULAR | Status: AC
  Filled 2020-11-04: qty 30

## 2020-11-04 MED ORDER — MORPHINE SULFATE (PF) 2 MG/ML IV SOLN
INTRAVENOUS | Status: AC
  Filled 2020-11-04: qty 1

## 2020-11-04 MED ORDER — FENTANYL CITRATE (PF) 100 MCG/2ML IJ SOLN
INTRAMUSCULAR | Status: DC | PRN
  Administered 2020-11-04 (×2): 25 ug via INTRAVENOUS

## 2020-11-04 MED ORDER — PRAVASTATIN SODIUM 10 MG PO TABS
10.0000 mg | ORAL_TABLET | Freq: Every day | ORAL | Status: DC
  Administered 2020-11-05: 10 mg via ORAL
  Filled 2020-11-04: qty 1

## 2020-11-04 MED ORDER — SODIUM ZIRCONIUM CYCLOSILICATE 5 G PO PACK
5.0000 g | PACK | Freq: Once | ORAL | Status: AC
  Administered 2020-11-04: 5 g via ORAL
  Filled 2020-11-04: qty 1

## 2020-11-04 MED ORDER — HYDRALAZINE HCL 20 MG/ML IJ SOLN
10.0000 mg | INTRAMUSCULAR | Status: AC | PRN
Start: 2020-11-04 — End: 2020-11-04
  Administered 2020-11-04: 10 mg via INTRAVENOUS

## 2020-11-04 MED ORDER — LIDOCAINE HCL (PF) 1 % IJ SOLN
INTRAMUSCULAR | Status: DC | PRN
  Administered 2020-11-04 (×2): 15 mL

## 2020-11-04 MED ORDER — NITROGLYCERIN 1 MG/10 ML FOR IR/CATH LAB
INTRA_ARTERIAL | Status: DC | PRN
  Administered 2020-11-04 (×2): 200 ug via INTRACORONARY

## 2020-11-04 MED ORDER — SODIUM CHLORIDE 0.9 % IV BOLUS
500.0000 mL | Freq: Once | INTRAVENOUS | Status: AC
  Administered 2020-11-04: 500 mL via INTRAVENOUS

## 2020-11-04 MED ORDER — HYDRALAZINE HCL 20 MG/ML IJ SOLN
INTRAMUSCULAR | Status: AC
  Filled 2020-11-04: qty 1

## 2020-11-04 MED ORDER — HEPARIN SODIUM (PORCINE) 1000 UNIT/ML IJ SOLN
INTRAMUSCULAR | Status: AC
  Filled 2020-11-04: qty 1

## 2020-11-04 MED ORDER — MYCOPHENOLATE MOFETIL 250 MG PO CAPS
250.0000 mg | ORAL_CAPSULE | Freq: Every day | ORAL | Status: DC
  Administered 2020-11-05 (×2): 250 mg via ORAL
  Filled 2020-11-04 (×3): qty 1

## 2020-11-04 MED ORDER — CYCLOSPORINE 100 MG PO CAPS
100.0000 mg | ORAL_CAPSULE | Freq: Every day | ORAL | Status: DC
  Administered 2020-11-05 – 2020-11-06 (×2): 100 mg via ORAL
  Filled 2020-11-04 (×2): qty 1

## 2020-11-04 MED ORDER — CALCITRIOL 0.25 MCG PO CAPS
0.2500 ug | ORAL_CAPSULE | ORAL | Status: DC
  Administered 2020-11-06: 0.25 ug via ORAL
  Filled 2020-11-04: qty 1

## 2020-11-04 MED ORDER — TRAZODONE HCL 100 MG PO TABS
100.0000 mg | ORAL_TABLET | Freq: Every day | ORAL | Status: DC
  Administered 2020-11-05: 100 mg via ORAL
  Filled 2020-11-04: qty 1

## 2020-11-04 MED ORDER — SODIUM BICARBONATE 650 MG PO TABS
650.0000 mg | ORAL_TABLET | Freq: Two times a day (BID) | ORAL | Status: DC
  Administered 2020-11-05 – 2020-11-06 (×4): 650 mg via ORAL
  Filled 2020-11-04 (×4): qty 1

## 2020-11-04 MED ORDER — ISOSORBIDE MONONITRATE ER 30 MG PO TB24: 30.0000 mg | ORAL_TABLET | Freq: Every day | ORAL | Status: DC

## 2020-11-04 MED ORDER — HEPARIN (PORCINE) IN NACL 1000-0.9 UT/500ML-% IV SOLN
INTRAVENOUS | Status: DC | PRN
  Administered 2020-11-04 (×3): 500 mL

## 2020-11-04 MED ORDER — LABETALOL HCL 5 MG/ML IV SOLN: 10.0000 mg | INTRAVENOUS | Status: AC | PRN

## 2020-11-04 MED ORDER — FENTANYL CITRATE (PF) 100 MCG/2ML IJ SOLN
INTRAMUSCULAR | Status: AC
  Filled 2020-11-04: qty 2

## 2020-11-04 MED ORDER — MIDAZOLAM HCL 2 MG/2ML IJ SOLN
INTRAMUSCULAR | Status: AC
  Filled 2020-11-04: qty 2

## 2020-11-04 MED ORDER — ISOSORBIDE MONONITRATE ER 30 MG PO TB24: 15.0000 mg | ORAL_TABLET | Freq: Every day | ORAL | Status: DC

## 2020-11-04 MED ORDER — CLOPIDOGREL BISULFATE 75 MG PO TABS
75.0000 mg | ORAL_TABLET | Freq: Every day | ORAL | Status: DC
  Administered 2020-11-05 – 2020-11-06 (×2): 75 mg via ORAL
  Filled 2020-11-04 (×2): qty 1

## 2020-11-04 MED ORDER — ONDANSETRON HCL 4 MG PO TABS: 4.0000 mg | ORAL_TABLET | Freq: Four times a day (QID) | ORAL | Status: DC

## 2020-11-04 MED ORDER — ASPIRIN 81 MG PO CHEW
81.0000 mg | CHEWABLE_TABLET | Freq: Every day | ORAL | Status: DC
  Filled 2020-11-04: qty 1

## 2020-11-04 MED ORDER — HEPARIN SODIUM (PORCINE) 1000 UNIT/ML IJ SOLN
INTRAMUSCULAR | Status: DC | PRN
  Administered 2020-11-04: 2000 [IU] via INTRAVENOUS
  Administered 2020-11-04: 9000 [IU] via INTRAVENOUS

## 2020-11-04 MED ORDER — CYCLOSPORINE 25 MG PO CAPS
75.0000 mg | ORAL_CAPSULE | Freq: Every day | ORAL | Status: DC
  Administered 2020-11-04 – 2020-11-05 (×2): 75 mg via ORAL
  Filled 2020-11-04 (×3): qty 3

## 2020-11-04 MED ORDER — HEPARIN (PORCINE) IN NACL 1000-0.9 UT/500ML-% IV SOLN
INTRAVENOUS | Status: AC
  Filled 2020-11-04: qty 1000

## 2020-11-04 MED ORDER — SODIUM CHLORIDE 0.9 % IV SOLN
Freq: Once | INTRAVENOUS | Status: AC
  Administered 2020-11-04: 250 mL via INTRAVENOUS

## 2020-11-04 MED ORDER — PANTOPRAZOLE SODIUM 40 MG PO TBEC
40.0000 mg | DELAYED_RELEASE_TABLET | Freq: Every day | ORAL | Status: DC
  Administered 2020-11-05 (×2): 40 mg via ORAL
  Filled 2020-11-04 (×2): qty 1

## 2020-11-04 MED ORDER — OXYCODONE HCL 5 MG PO TABS
10.0000 mg | ORAL_TABLET | Freq: Four times a day (QID) | ORAL | Status: DC | PRN
  Administered 2020-11-06: 10 mg via ORAL
  Filled 2020-11-04: qty 2

## 2020-11-04 MED ORDER — ADULT MULTIVITAMIN W/MINERALS CH
1.0000 | ORAL_TABLET | Freq: Every day | ORAL | Status: DC
  Administered 2020-11-05 – 2020-11-06 (×2): 1 via ORAL
  Filled 2020-11-04 (×2): qty 1

## 2020-11-04 MED ORDER — SODIUM CHLORIDE 0.9 % IV BOLUS: 1000.0000 mL | Freq: Once | INTRAVENOUS | Status: DC | PRN

## 2020-11-04 MED ORDER — HEPARIN (PORCINE) IN NACL 1000-0.9 UT/500ML-% IV SOLN
INTRAVENOUS | Status: AC
  Filled 2020-11-04: qty 500

## 2020-11-04 MED ORDER — FUROSEMIDE 20 MG PO TABS: 20.0000 mg | ORAL_TABLET | ORAL | Status: DC

## 2020-11-04 MED ORDER — SODIUM CHLORIDE 0.9% FLUSH
3.0000 mL | Freq: Two times a day (BID) | INTRAVENOUS | Status: DC
  Administered 2020-11-05 – 2020-11-06 (×3): 3 mL via INTRAVENOUS

## 2020-11-04 MED ORDER — MORPHINE SULFATE (PF) 2 MG/ML IV SOLN
2.0000 mg | Freq: Once | INTRAVENOUS | Status: AC
  Administered 2020-11-04: 2 mg via INTRAVENOUS

## 2020-11-04 MED ORDER — IOHEXOL 350 MG/ML SOLN
INTRAVENOUS | Status: DC | PRN
  Administered 2020-11-04: 80 mL

## 2020-11-04 MED ORDER — SODIUM CHLORIDE 0.9% FLUSH: 3.0000 mL | Freq: Two times a day (BID) | INTRAVENOUS | Status: DC

## 2020-11-04 MED ORDER — MIDAZOLAM HCL 2 MG/2ML IJ SOLN
INTRAMUSCULAR | Status: DC | PRN
  Administered 2020-11-04 (×2): 1 mg via INTRAVENOUS

## 2020-11-04 SURGICAL SUPPLY — 28 items
BALLN EMERGE MR 2.5X20 (BALLOONS) ×2
BALLN ~~LOC~~ EMERGE MR 3.0X12 (BALLOONS) ×2
BALLOON EMERGE MR 2.5X20 (BALLOONS) IMPLANT
BALLOON ~~LOC~~ EMERGE MR 3.0X12 (BALLOONS) IMPLANT
CATH INFINITI JR4 5F (CATHETERS) ×1 IMPLANT
CATH MACH1 8FR CLS3.5 (CATHETERS) ×1 IMPLANT
CATH MAMBA 135 (CATHETERS) ×1 IMPLANT
CATH OPTICROSS HD (CATHETERS) ×1 IMPLANT
CATH TRAPPER 6-8F (CATHETERS) ×1 IMPLANT
DEVICE CONTINUOUS FLUSH (MISCELLANEOUS) ×2 IMPLANT
ELECT DEFIB PAD ADLT CADENCE (PAD) ×1 IMPLANT
KIT ENCORE 26 ADVANTAGE (KITS) ×1 IMPLANT
KIT ESSENTIALS PG (KITS) ×1 IMPLANT
KIT HEART LEFT (KITS) ×2 IMPLANT
KIT MICROPUNCTURE NIT STIFF (SHEATH) ×1 IMPLANT
MAT PREVALON FULL STRYKER (MISCELLANEOUS) ×1 IMPLANT
PACK CARDIAC CATHETERIZATION (CUSTOM PROCEDURE TRAY) ×2 IMPLANT
SHEATH BRITE TIP 8FR 35CM (SHEATH) ×1 IMPLANT
SHEATH PINNACLE 5F 10CM (SHEATH) ×1 IMPLANT
SHEATH PROBE COVER 6X72 (BAG) ×1 IMPLANT
SLED PULL BACK IVUS (MISCELLANEOUS) ×1 IMPLANT
STENT SYNERGY XD 2.50X32 (Permanent Stent) IMPLANT
SYNERGY XD 2.50X32 (Permanent Stent) ×2 IMPLANT
TRANSDUCER W/STOPCOCK (MISCELLANEOUS) ×2 IMPLANT
TUBING CIL FLEX 10 FLL-RA (TUBING) ×2 IMPLANT
WIRE ASAHI PROWATER 180CM (WIRE) ×1 IMPLANT
WIRE EMERALD 3MM-J .035X150CM (WIRE) ×1 IMPLANT
WIRE FIGHTER CROSSING 190CM (WIRE) ×1 IMPLANT

## 2020-11-04 NOTE — Progress Notes (Addendum)
Spoke with Dr. Martinique related to pt's bilateral groins (sheaths remain in place) continuous oozing, positive sanguineous drainage noted, +, level 1 hematoma noted to left groin with bruising, Debbie, RN holding manual pressure with decrease in hematoma, safety maintained, see flowsheets, Per Dr. Martinique ok to remove sheaths when ACT level below 200

## 2020-11-04 NOTE — Significant Event (Addendum)
Rapid Response Event Note   Reason for Call :  Symptomatic hypotension s/p cath procedure  Initial Focused Assessment:  On my arrival patient is alert and oriented laying in bed in trendelenburg position. She is cool, clammy, pale, and diaphoretic. She is complaining of 5/10 back pain and 6/10 chest pain, and pain at previous cath sites. R groin is soft to the touch with no visible bruising and is more tender than the L. L groin is bruised and slightly firm to touch- level 2 in size. Her flank is not bruised and is not tender to the touch.   VS: HR 61, BP 68/36, RR 19, Sats 98%.   Patient had similar episode today with chest pain, back pain, hypotension and nausea. MD ordered EKG, 250 NS bolus.   Interventions:  1L bolus ordered by Dr. Clayton Bibles STAT EKG-  Marked groin site Bedside RN gave zofran  Plan of Care:  BP up to 97/48 with half NS bolus to go Keep patient on bed rest for now Continue to monitor closely and notify RRT if further assistance is needed.   Event Summary:   MD Notified: Dr. Clayton Bibles notififed prior to RRT arrival Call Time: 2030 Arrival Time: 2035 End Time: 2122   [facility]  Daryn Pisani, RN  Addendum @2340 : VS: BP 80/41 HR 73 RR 13 Sats 97% on RA.  L groin tender to touch but not as tender as R, L groin bruising has extended about an inch more lateral to previous marking. She is currently another bolus per MD and phlebotomy drew STAT H&H.  Plan: Follow lab trends. Continue to monitor closely.

## 2020-11-04 NOTE — Progress Notes (Addendum)
SITE AREA: right and left groin/femoral  SITE PRIOR TO REMOVAL:  LEVEL right femoral 0, left femoral 1  PRESSURE APPLIED FOR: for approximately 35 minutes to each groin  MANUAL: yes  PATIENT STATUS DURING PULL: decreased BP and Chest pain noted, see note and flowsheets  POST PULL SITE:  LEVEL 0, bilaterally, bruising noted to left groin  POST PULL INSTRUCTIONS GIVEN: yes  POST PULL PULSES PRESENT: bilateral pedal pulses at +2  DRESSING APPLIED: gauze with tegaderm to each groin  BEDREST BEGINS @ 2415  COMMENTS: left groin sheath removed by Jackelyn Poling, RN, right groin sheath removed by Wynonia Sours, RN

## 2020-11-04 NOTE — Progress Notes (Signed)
Pt BP upper 90's paged cardiology pt blood pressure has been dropping gradually since transferred to unit. Cecilie Kicks, NP notified about pt. New orders received for pt to have a STAT CBC and BMP. Phlebotomy on unit already and presented to pt's room to collect. Pt is being monitored very closely.

## 2020-11-04 NOTE — Progress Notes (Signed)
Mickel Baas, cardiology NP at bedside

## 2020-11-04 NOTE — Progress Notes (Signed)
2030-Pt's BP now low 60's SBP. Paged Dr. Clayton Bibles about pt. Rapid response already called for pt. Orders received from MD to bolus pt needed fluids to increase BP. Pt started at 500 ml to bolus.   2100-BP remains in the low 80's SBP. Pt getting the rest of the ordered bolus another 500 ml. Pt was complaining of abd, back, and chest pain along with nausea. Pt received zofran 4 mg IVP.   2120- Pt's BP now 97/48. Paged Dr. Clayton Bibles to give a pt update. Pt states feeling better no longer having chest pain or nausea. Will continue to monitor pt.

## 2020-11-04 NOTE — Progress Notes (Signed)
   Pt had chest pain with BP drop with sheath pull.  I saw pt and her pain was resolved at that time.  EKG was done and no acute changes.  Pt felt well.    Cecilie Kicks, FNP-C [facility]  [phone] or after 5pm and on weekends call [phone] 11/04/2020.

## 2020-11-04 NOTE — Interval H&P Note (Signed)
History and Physical Interval Note:  11/04/2020 8:29 AM  [name]  has presented today for surgery, with the diagnosis of cad.  The various methods of treatment have been discussed with the patient and family. After consideration of risks, benefits and other options for treatment, the patient has consented to  Procedure(s): CORONARY CTO INTERVENTION (N/A) as a surgical intervention.  The patient's history has been reviewed, patient examined, no change in status, stable for surgery.  I have reviewed the patient's chart and labs.  Questions were answered to the patient's satisfaction.   Cath Lab Visit (complete for each Cath Lab visit)  Clinical Evaluation Leading to the Procedure:   ACS: No.  Non-ACS:    Anginal Classification: CCS III  Anti-ischemic medical therapy: Maximal Therapy (2 or more classes of medications)  Non-Invasive Test Results: Intermediate-risk stress test findings: cardiac mortality 1-3%/year  Prior CABG: No previous CABG        Collier [facility] 11/04/2020 8:29 AM

## 2020-11-04 NOTE — Progress Notes (Addendum)
[name], supervisor called to bedside, pt with decreased bp, +nausea, + mid chest pain at 5/10, Dr. Martinique paged by [name] through [name] texting, return call noted, Dr.Jordan made aware of situation, orders obtained, safety maintained, [name] and Debbie, RN's continue holding manual pressure to bilateral groins, EKG obtained

## 2020-11-05 ENCOUNTER — Ambulatory Visit (HOSPITAL_COMMUNITY): Payer: Medicare Other

## 2020-11-05 ENCOUNTER — Encounter (HOSPITAL_COMMUNITY): Payer: Self-pay | Admitting: Cardiology

## 2020-11-05 DIAGNOSIS — Z9482 Intestine transplant status: Secondary | ICD-10-CM | POA: Diagnosis not present

## 2020-11-05 DIAGNOSIS — I25110 Atherosclerotic heart disease of native coronary artery with unstable angina pectoris: Secondary | ICD-10-CM | POA: Diagnosis not present

## 2020-11-05 DIAGNOSIS — Z87828 Personal history of other (healed) physical injury and trauma: Secondary | ICD-10-CM | POA: Diagnosis not present

## 2020-11-05 DIAGNOSIS — I129 Hypertensive chronic kidney disease with stage 1 through stage 4 chronic kidney disease, or unspecified chronic kidney disease: Secondary | ICD-10-CM | POA: Diagnosis present

## 2020-11-05 DIAGNOSIS — I9589 Other hypotension: Secondary | ICD-10-CM | POA: Diagnosis not present

## 2020-11-05 DIAGNOSIS — N184 Chronic kidney disease, stage 4 (severe): Secondary | ICD-10-CM

## 2020-11-05 DIAGNOSIS — E785 Hyperlipidemia, unspecified: Secondary | ICD-10-CM | POA: Diagnosis present

## 2020-11-05 DIAGNOSIS — Z881 Allergy status to other antibiotic agents status: Secondary | ICD-10-CM | POA: Diagnosis not present

## 2020-11-05 DIAGNOSIS — D62 Acute posthemorrhagic anemia: Secondary | ICD-10-CM

## 2020-11-05 DIAGNOSIS — Z79899 Other long term (current) drug therapy: Secondary | ICD-10-CM | POA: Diagnosis not present

## 2020-11-05 DIAGNOSIS — Z7902 Long term (current) use of antithrombotics/antiplatelets: Secondary | ICD-10-CM | POA: Diagnosis not present

## 2020-11-05 DIAGNOSIS — I25119 Atherosclerotic heart disease of native coronary artery with unspecified angina pectoris: Secondary | ICD-10-CM | POA: Diagnosis not present

## 2020-11-05 DIAGNOSIS — Z944 Liver transplant status: Secondary | ICD-10-CM | POA: Diagnosis not present

## 2020-11-05 DIAGNOSIS — Z8711 Personal history of peptic ulcer disease: Secondary | ICD-10-CM | POA: Diagnosis not present

## 2020-11-05 DIAGNOSIS — Z9884 Bariatric surgery status: Secondary | ICD-10-CM | POA: Diagnosis not present

## 2020-11-05 DIAGNOSIS — E861 Hypovolemia: Secondary | ICD-10-CM

## 2020-11-05 DIAGNOSIS — Y92239 Unspecified place in hospital as the place of occurrence of the external cause: Secondary | ICD-10-CM | POA: Diagnosis present

## 2020-11-05 DIAGNOSIS — I2582 Chronic total occlusion of coronary artery: Secondary | ICD-10-CM | POA: Diagnosis not present

## 2020-11-05 DIAGNOSIS — I97610 Postprocedural hemorrhage and hematoma of a circulatory system organ or structure following a cardiac catheterization: Secondary | ICD-10-CM | POA: Diagnosis not present

## 2020-11-05 DIAGNOSIS — K661 Hemoperitoneum: Secondary | ICD-10-CM | POA: Diagnosis not present

## 2020-11-05 DIAGNOSIS — Z529 Donor of unspecified organ or tissue: Secondary | ICD-10-CM | POA: Diagnosis not present

## 2020-11-05 DIAGNOSIS — Z91048 Other nonmedicinal substance allergy status: Secondary | ICD-10-CM | POA: Diagnosis not present

## 2020-11-05 DIAGNOSIS — D631 Anemia in chronic kidney disease: Secondary | ICD-10-CM | POA: Diagnosis not present

## 2020-11-05 DIAGNOSIS — N179 Acute kidney failure, unspecified: Secondary | ICD-10-CM | POA: Diagnosis not present

## 2020-11-05 DIAGNOSIS — Z955 Presence of coronary angioplasty implant and graft: Secondary | ICD-10-CM | POA: Diagnosis not present

## 2020-11-05 DIAGNOSIS — Z87442 Personal history of urinary calculi: Secondary | ICD-10-CM | POA: Diagnosis not present

## 2020-11-05 DIAGNOSIS — Z87891 Personal history of nicotine dependence: Secondary | ICD-10-CM | POA: Diagnosis not present

## 2020-11-05 DIAGNOSIS — D84821 Immunodeficiency due to drugs: Secondary | ICD-10-CM | POA: Diagnosis not present

## 2020-11-05 DIAGNOSIS — E1122 Type 2 diabetes mellitus with diabetic chronic kidney disease: Secondary | ICD-10-CM | POA: Diagnosis not present

## 2020-11-05 DIAGNOSIS — Z91041 Radiographic dye allergy status: Secondary | ICD-10-CM | POA: Diagnosis not present

## 2020-11-05 DIAGNOSIS — Z7982 Long term (current) use of aspirin: Secondary | ICD-10-CM | POA: Diagnosis not present

## 2020-11-05 DIAGNOSIS — Y840 Cardiac catheterization as the cause of abnormal reaction of the patient, or of later complication, without mention of misadventure at the time of the procedure: Secondary | ICD-10-CM | POA: Diagnosis present

## 2020-11-05 DIAGNOSIS — K429 Umbilical hernia without obstruction or gangrene: Secondary | ICD-10-CM | POA: Diagnosis not present

## 2020-11-05 DIAGNOSIS — Z9483 Pancreas transplant status: Secondary | ICD-10-CM | POA: Diagnosis not present

## 2020-11-05 LAB — CBC
HCT: 28.7 % — ABNORMAL LOW (ref 36.0–46.0)
Hemoglobin: 8.7 g/dL — ABNORMAL LOW (ref 12.0–15.0)
MCH: 29.5 pg (ref 26.0–34.0)
MCHC: 30.3 g/dL (ref 30.0–36.0)
MCV: 97.3 fL (ref 80.0–100.0)
Platelets: 220 10*3/uL (ref 150–400)
RBC: 2.95 MIL/uL — ABNORMAL LOW (ref 3.87–5.11)
RDW: 14 % (ref 11.5–15.5)
WBC: 10.1 10*3/uL (ref 4.0–10.5)
nRBC: 0 % (ref 0.0–0.2)

## 2020-11-05 LAB — HEMOGLOBIN AND HEMATOCRIT, BLOOD
HCT: 25.6 % — ABNORMAL LOW (ref 36.0–46.0)
HCT: 30.1 % — ABNORMAL LOW (ref 36.0–46.0)
Hemoglobin: 7.6 g/dL — ABNORMAL LOW (ref 12.0–15.0)
Hemoglobin: 9.1 g/dL — ABNORMAL LOW (ref 12.0–15.0)

## 2020-11-05 LAB — BASIC METABOLIC PANEL
Anion gap: 6 (ref 5–15)
Anion gap: 5 (ref 5–15)
BUN: 33 mg/dL — ABNORMAL HIGH (ref 6–20)
BUN: 33 mg/dL — ABNORMAL HIGH (ref 6–20)
CO2: 18 mmol/L — ABNORMAL LOW (ref 22–32)
CO2: 17 mmol/L — ABNORMAL LOW (ref 22–32)
Calcium: 8 mg/dL — ABNORMAL LOW (ref 8.9–10.3)
Calcium: 7.9 mg/dL — ABNORMAL LOW (ref 8.9–10.3)
Chloride: 115 mmol/L — ABNORMAL HIGH (ref 98–111)
Chloride: 117 mmol/L — ABNORMAL HIGH (ref 98–111)
Creatinine, Ser: 2.29 mg/dL — ABNORMAL HIGH (ref 0.44–1.00)
Creatinine, Ser: 2.52 mg/dL — ABNORMAL HIGH (ref 0.44–1.00)
GFR, Estimated: 24 mL/min — ABNORMAL LOW (ref >60)
GFR, Estimated: 21 mL/min — ABNORMAL LOW (ref >60)
Glucose, Bld: 101 mg/dL — ABNORMAL HIGH (ref 70–99)
Glucose, Bld: 97 mg/dL (ref 70–99)
Potassium: 5.3 mmol/L — ABNORMAL HIGH (ref 3.5–5.1)
Potassium: 5 mmol/L (ref 3.5–5.1)
Sodium: 139 mmol/L (ref 135–145)
Sodium: 139 mmol/L (ref 135–145)

## 2020-11-05 LAB — PREPARE RBC (CROSSMATCH)

## 2020-11-05 LAB — POCT ACTIVATED CLOTTING TIME
Activated Clotting Time: 219 seconds
Activated Clotting Time: 202 seconds

## 2020-11-05 MED ORDER — DIPHENHYDRAMINE HCL 25 MG PO CAPS
25.0000 mg | ORAL_CAPSULE | Freq: Once | ORAL | Status: AC
  Administered 2020-11-05: 25 mg via ORAL
  Filled 2020-11-05: qty 1

## 2020-11-05 MED ORDER — ACETAMINOPHEN 325 MG PO TABS
650.0000 mg | ORAL_TABLET | Freq: Once | ORAL | Status: AC
  Administered 2020-11-05: 650 mg via ORAL
  Filled 2020-11-05: qty 2

## 2020-11-05 MED ORDER — SODIUM CHLORIDE 0.9 % IV BOLUS
500.0000 mL | Freq: Once | INTRAVENOUS | Status: AC
  Administered 2020-11-05: 500 mL via INTRAVENOUS

## 2020-11-05 MED ORDER — SODIUM CHLORIDE 0.9% IV SOLUTION: Freq: Once | INTRAVENOUS | Status: AC

## 2020-11-05 MED FILL — Lidocaine HCl Local Preservative Free (PF) Inj 1%: INTRAMUSCULAR | Qty: 30 | Status: AC

## 2020-11-05 NOTE — Progress Notes (Signed)
Progress Note  Patient Name: [name] Date of Encounter: 11/05/2020  [facility] Cardiologist: Rozann Lesches, MD   Subjective   Felt poorly overnight with chest pain in the setting of hypotension, overall improved this morning. Still quite sore.   Inpatient Medications    Scheduled Meds:  aspirin EC  81 mg Oral Daily   atenolol  25 mg Oral Daily   [START ON 11/06/2020] calcitRIOL  0.25 mcg Oral Q M,W,F   clopidogrel  75 mg Oral Q breakfast   cycloSPORINE  100 mg Oral Daily   cycloSPORINE  75 mg Oral QHS   [START ON 11/06/2020] furosemide  20 mg Oral Q M,W,F   isosorbide mononitrate  15 mg Oral QHS   isosorbide mononitrate  30 mg Oral Daily   lisinopril  2.5 mg Oral Daily   multivitamin with minerals  1 tablet Oral Daily   mycophenolate  250 mg Oral QHS   mycophenolate  500 mg Oral Daily   pantoprazole  40 mg Oral QHS   pravastatin  10 mg Oral q1800   sodium bicarbonate  650 mg Oral BID   sodium chloride flush  3 mL Intravenous Q12H   traZODone  100 mg Oral QHS   Continuous Infusions:  sodium chloride     sodium chloride     PRN Meds: sodium chloride, acetaminophen, nitroGLYCERIN, ondansetron (ZOFRAN) IV, oxyCODONE, sodium chloride, sodium chloride flush   Vital Signs    Vitals:   11/05/20 0507 11/05/20 0622 11/05/20 0825 11/05/20 0839  BP: (!) 94/59 (!) 109/53 96/66 107/60  Pulse: 65 62 65 63  Resp: 18 17 17 14   Temp: 97.8 F (36.6 C) 98.2 F (36.8 C) 98.2 F (36.8 C) 97.8 F (36.6 C)  TempSrc: Oral Oral Oral Oral  SpO2: 96% 96% 98%   Weight:      Height:        Intake/Output Summary (Last 24 hours) at 11/05/2020 0853 Last data filed at 11/05/2020 0839 Gross per 24 hour  Intake 1815 ml  Output --  Net 1815 ml   Last 3 Weights 11/04/2020 10/13/2020 10/08/2020  Weight (lbs) 220 lb 228 lb 220 lb  Weight (kg) 99.791 kg 103.42 kg 99.791 kg      Telemetry    Sinus rhythm - Personally Reviewed  ECG    Sinus rhythm, rate 64 bpm, no STE/D,  no TWI - Personally Reviewed  Physical Exam   GEN: No acute distress.   Neck: No JVD Cardiac: RRR, no murmurs, rubs, or gallops. Bilateral groin sites with significant left groin ecchymosis but no hematoma or bruit Respiratory: Clear to auscultation bilaterally. GI: Soft, obese, nontender, non-distended  MS: No edema; No deformity. Neuro:  Nonfocal  Psych: Normal affect   Labs    High Sensitivity Troponin:  No results for input(s): TROPONINIHS in the last 720 hours.   Chemistry Recent Labs  Lab 11/04/20 1013 11/04/20 1950 11/05/20 0728  NA 135 135 139  K 5.4* 4.8 5.3*  CL 112* 115* 115*  CO2 17* 16* 18*  GLUCOSE 126* 277* 101*  BUN 29* 31* 33*  CREATININE 1.84* 2.02* 2.29*  CALCIUM 8.7* 8.2* 8.0*  GFRNONAA 31* 28* 24*  ANIONGAP 6 4* 6    Lipids No results for input(s): CHOL, TRIG, HDL, LABVLDL, LDLCALC, CHOLHDL in the last 168 hours.  Hematology Recent Labs  Lab 11/04/20 1950 11/04/20 2336 11/05/20 0728  WBC 14.4*  --  10.1  RBC 2.95*  --  2.95*  HGB 8.9* 7.6* 8.7*  HCT 29.2* 25.6* 28.7*  MCV 99.0  --  97.3  MCH 30.2  --  29.5  MCHC 30.5  --  30.3  RDW 13.2  --  14.0  PLT 241  --  220   Thyroid No results for input(s): TSH, FREET4 in the last 168 hours.  BNPNo results for input(s): BNP, PROBNP in the last 168 hours.  DDimer No results for input(s): DDIMER in the last 168 hours.   Radiology    CT ABDOMEN PELVIS WO CONTRAST  Result Date: 11/05/2020 CLINICAL DATA:  Retroperitoneal hematoma EXAM: CT ABDOMEN AND PELVIS WITHOUT CONTRAST TECHNIQUE: Multidetector CT imaging of the abdomen and pelvis was performed following the standard protocol without IV contrast. COMPARISON:  10/08/2020 FINDINGS: Lower chest: The visualized lung bases are clear bilaterally. The visualized heart and pericardium are unremarkable. Hepatobiliary: Status post liver transplantation. Gallbladder has been excised. Donor aorta appears anastomosed to the infrarenal abdominal aorta. No  definite intrahepatic mass on this noncontrast examination. No intrahepatic biliary ductal dilation. Pancreas: Stable appearance of the pancreas within the retroperitoneum in keeping with given history of pancreatic transplant. Spleen: Status post splenectomy small splenule again noted within the left upper quadrant. Adrenals/Urinary Tract: The adrenal glands are unremarkable. The kidneys are atrophic bilaterally, similar to prior examination. Previously noted nonobstructing renal calculi are somewhat obscured by contrast within the renal collecting system bilaterally, however, a a 9 mm dominant calculus is again identified within the upper pole the right kidney. No hydronephrosis. The bladder is unremarkable. 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 Anastomotic staple line noted within the distal transverse colon. Mild ascites is again noted. No focal inflammatory change. No free intraperitoneal gas. No evidence of obstruction. Vascular/Lymphatic: Aside from aorto-aortic anastomosis, the abdominal vasculature is unremarkable. No pathologic adenopathy within the abdomen and pelvis. Reproductive: Status post hysterectomy. No adnexal masses. Other: There is subcutaneous infiltration within the a inferior pannus and within the inguinal regions bilaterally likely representing subcutaneous hemorrhage in the setting of recent femoral catheterization. No discrete hematoma identified. There is mild infiltration within the right retroperitoneum in keeping with a trace amount of retroperitoneal hemorrhage. Small right periumbilical ventral hernia containing a small amount of ascitic fluid, unchanged. Musculoskeletal: No acute bone abnormality. No lytic or blastic bone lesion. IMPRESSION: Moderate subcutaneous soft tissue infiltration within the inferior pannus and inguinal regions bilaterally in keeping with a moderate amount of  subcutaneous interstitial hemorrhage in the setting of recent femoral catheterization. Trace retroperitoneal infiltrative hemorrhage. No discrete masslike hematoma identified. Surgical changes in keeping with reported liver, pancreas, stomach, small bowel, and partial large bowel transplantation. Status post splenectomy. Stable trace ascites. Stable nonobstructing bilateral nephrolithiasis. Electronically Signed   By: Fidela Salisbury M.D.   On: 11/05/2020 03:42   CARDIAC CATHETERIZATION  Result Date: 11/04/2020   Mid LAD lesion is 100% stenosed.   A drug-eluting stent was successfully placed using a SYNERGY XD 2.50X32.   Post intervention, there is a 0% residual stenosis. Successful CTO PCI of the mid LAD with IVUS guidance and DES x 1 Plan: observe overnight with hydration. Monitor renal function closely. DAPT for at least 6 months. Anticipate DC tomorrow if stable.    Cardiac Studies   Echocardiogram 05/2020: 1. Left ventricular ejection fraction, by estimation, is 60 to 65%. The  left ventricle has normal function. The left ventricle has no regional  wall motion abnormalities. There is mild left  ventricular hypertrophy.  Left ventricular diastolic parameters  were normal. Normal global longitudinal strain of -19.1%.   2. Right ventricular systolic function is normal. The right ventricular  size is normal. There is normal pulmonary artery systolic pressure. The  estimated right ventricular systolic pressure is 52.8 mmHg.   3. Left atrial size was mildly dilated.   4. The mitral valve is grossly normal. Mild mitral valve regurgitation.   5. The aortic valve is tricuspid. Aortic valve regurgitation is not  visualized.   6. The inferior vena cava is normal in size with greater than 50%  respiratory variability, suggesting right atrial pressure of 3 mmHg.   Comparison(s): Echocardiogram done 02/27/17 ay [facility] showed an EF of 56%.   LHC 09/2020:   Mid LAD lesion is 100% stenosed.   Prox RCA  lesion is 40% stenosed.   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  Refer for consideration of CTO intervention.  Postprocedural fluids.  Recheck metabolic panel next week as an outpatient.  CTO 11/04/20:   Mid LAD lesion is 100% stenosed.   A drug-eluting stent was successfully placed using a SYNERGY XD 2.50X32.   Post intervention, there is a 0% residual stenosis.   Successful CTO PCI of the mid LAD with IVUS guidance and DES x 1   Plan: observe overnight with hydration. Monitor renal function closely. DAPT for at least 6 months. Anticipate DC tomorrow if stable.   Patient Profile     60 y.o. 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 CAD s/p CTO PCI/DES to LAD 11/05/20: patient underwent successful CTO PCI of mLAD with DES x1 11/04/20. She had some chest pain overnight in the setting of hypotension but EKG remained stable and CP resolved with improvement in BP.  - Continue aspirin and plavix - Continue statin  2. Acute on chronic anemia: patient with drop in Hgb from 11.3 10/08/20 (pre-cath) down to 8.9>7.6 following cath yesterday. She had some back pain and CT A/P showed trace retroperitoneal bleed without mass like hematoma. She was given 1 uPRBC. Groin site with significant ecchymosis but no hematoma or bruit. - Will follow-up post-transfusion CBC - Continue to monitor closely  3. AoCKD stage IV: Cr bumped slightly from baseline 1.8>2.02>2.29. She was given fluids overnight in the setting of hypotension. Likely multifactorial in the setting of hypotension and contrast nephropathy. - Will give additional IVF's this morning - Plan to repeat BMET this evening  for close monitoring  4. HTN: BP quite low overnight.  - Will hold home antihypertensives  5. HLD: LDL 78 08/2020 on fluvastatin at home - Continue pravastatin this admission - Consider referral to lipid clinic  6. S/p stomach/liver/pancreas/small bowel transplant: on immunosuppressives - Continue cyclosporine  For questions or updates, please contact [place] Please consult [email or web address] for contact info under        Signed, Abigail Butts, PA-C  11/05/2020, 8:53 AM

## 2020-11-05 NOTE — Progress Notes (Signed)
Pt's bil. Groin sites marked at start of shift and monitored closely. Pt has a lot of ecchymosis to left groin versus right. Right side is more tender when touched slight ecchymosis noted as well.

## 2020-11-05 NOTE — Progress Notes (Signed)
Callback from Dr. Clayton Bibles about pt CT results no new orders received pt is receiving a blood transfusion. Pt is without complaints of severe pain at this time. Pt voided this am as well.

## 2020-11-05 NOTE — Progress Notes (Signed)
Brief Cardiology Note  [name] has had multiple episodes of symptomatic hypotension post intervention on her LAD CTO earlier today. Concurrent with this has been a significant drop in her hemoglobin. Her blood pressure has responded to multiple boluses, but she is now also having some R flank pain. There was no evidence of a hemtoma in her R groin when I examined her. She was rather tender in her L groin. There was bruising with a very small hematoma at the access site. I think the likelihood of RP bleed is lower, but given her progressive anemia, hypotension, and flank pain, we will get a noncon CT abd/pelvis to evaluate for this. We will also transfuse 1 unit PRBCs now.

## 2020-11-05 NOTE — Progress Notes (Signed)
Dr. Clayton Bibles notified about pt results from H&H with hgb 7.6. New orders placed on pt for a type and screen, CT abd/pelvis, and to transfuse 1 unit of PRBCs.

## 2020-11-05 NOTE — Progress Notes (Signed)
Dr. Clayton Bibles notified that pt SBP is dropping to 70's to 80's. Pt is asymptomatic other than continuation of right groin pain. New orders received to give pt a 500 ml NS bolus and STAT H&H. Will continue to monitor pt very closely for further complications.

## 2020-11-06 ENCOUNTER — Encounter (HOSPITAL_COMMUNITY): Payer: Self-pay | Admitting: Interventional Cardiology

## 2020-11-06 DIAGNOSIS — Z955 Presence of coronary angioplasty implant and graft: Secondary | ICD-10-CM

## 2020-11-06 DIAGNOSIS — I25110 Atherosclerotic heart disease of native coronary artery with unstable angina pectoris: Secondary | ICD-10-CM

## 2020-11-06 DIAGNOSIS — Z95820 Peripheral vascular angioplasty status with implants and grafts: Secondary | ICD-10-CM

## 2020-11-06 DIAGNOSIS — D62 Acute posthemorrhagic anemia: Secondary | ICD-10-CM | POA: Diagnosis not present

## 2020-11-06 DIAGNOSIS — T82837A Hemorrhage of cardiac prosthetic devices, implants and grafts, initial encounter: Secondary | ICD-10-CM

## 2020-11-06 HISTORY — DX: Hemorrhage due to cardiac prosthetic devices, implants and grafts, initial encounter: T82.837A

## 2020-11-06 HISTORY — DX: Peripheral vascular angioplasty status with implants and grafts: Z95.820

## 2020-11-06 LAB — TYPE AND SCREEN
ABO/RH(D): B POS
Antibody Screen: NEGATIVE
Unit division: 0

## 2020-11-06 LAB — BASIC METABOLIC PANEL
Anion gap: 4 — ABNORMAL LOW (ref 5–15)
BUN: 33 mg/dL — ABNORMAL HIGH (ref 6–20)
CO2: 16 mmol/L — ABNORMAL LOW (ref 22–32)
Calcium: 7.9 mg/dL — ABNORMAL LOW (ref 8.9–10.3)
Chloride: 117 mmol/L — ABNORMAL HIGH (ref 98–111)
Creatinine, Ser: 2.41 mg/dL — ABNORMAL HIGH (ref 0.44–1.00)
GFR, Estimated: 23 mL/min — ABNORMAL LOW (ref >60)
Glucose, Bld: 95 mg/dL (ref 70–99)
Potassium: 5.5 mmol/L — ABNORMAL HIGH (ref 3.5–5.1)
Sodium: 137 mmol/L (ref 135–145)

## 2020-11-06 LAB — CBC
HCT: 28.4 % — ABNORMAL LOW (ref 36.0–46.0)
Hemoglobin: 8.5 g/dL — ABNORMAL LOW (ref 12.0–15.0)
MCH: 29.7 pg (ref 26.0–34.0)
MCHC: 29.9 g/dL — ABNORMAL LOW (ref 30.0–36.0)
MCV: 99.3 fL (ref 80.0–100.0)
Platelets: 220 10*3/uL (ref 150–400)
RBC: 2.86 MIL/uL — ABNORMAL LOW (ref 3.87–5.11)
RDW: 14.9 % (ref 11.5–15.5)
WBC: 8.2 10*3/uL (ref 4.0–10.5)
nRBC: 0 % (ref 0.0–0.2)

## 2020-11-06 LAB — BPAM RBC
Blood Product Expiration Date: 202210062359
ISSUE DATE / TIME: 202209290441
Unit Type and Rh: 7300

## 2020-11-06 MED ORDER — ACETAMINOPHEN 325 MG PO TABS: 650.0000 mg | ORAL_TABLET | ORAL | Status: DC | PRN

## 2020-11-06 MED ORDER — PRAVASTATIN SODIUM 10 MG PO TABS: 10.0000 mg | ORAL_TABLET | Freq: Every day | ORAL | 6 refills | Status: DC

## 2020-11-06 NOTE — Progress Notes (Signed)
Brief cardiology note  Seen after ambulation, able to walk to nurse's station. Had pain in right lower abdomen but it did not limit her.  Family present on our discussion. We reviewed how to call the office, where the [facility] is located, and red flag signs that need immediate attention. We arranged for close follow up for her on 10/10.  All questions answered. She is amenable to discharge today.  Buford Dresser, MD, PhD, [facility] at [facility] at [facility] [address] [place],  [address] [phone]

## 2020-11-06 NOTE — Discharge Instructions (Addendum)
If you need ER--med center [facility] located in [place] [address] at [employer] [place]  [id number]  Heart healthy diet   Call the office if problems   Call [facility] at [phone] if any bleeding, swelling or drainage at cath site.  May shower, no tub baths for 48 hours for groin sticks. No lifting over 5 pounds for 5 days.  No Driving for 5 days if you drive  Walk some every day but no strenuous activity

## 2020-11-06 NOTE — Progress Notes (Signed)
I reviewed d/c instructions with patient and family at bedside. No further questions. Patient verbalized understanding. Patient d/c'd via wheelchair to private vehicle

## 2020-11-06 NOTE — Progress Notes (Signed)
Progress Note  Patient Name: [name] Date of Encounter: 11/06/2020  [facility] Cardiologist: Rozann Lesches, MD   Subjective   Slept better, but continues to have pain on right side. On exam (see below) this is actually pain in her abdomen/pannus area without firmness.  We discussed management at length this morning. She is nervous that if she goes home, she may have another bleed. We reviewed recommendations on how to minimize the risk of this. Discussed lifting/activity restrictions. She has not gotten up and moved beyond the edge of bed to bedside commode. Discussed ambulation today, which we held on yesterday given the bleed.  We discussed red flag signs that need immediate medical attention and how to contact our office.  No further chest pain. Breathing is stable.   Inpatient Medications    Scheduled Meds:  aspirin EC  81 mg Oral Daily   calcitRIOL  0.25 mcg Oral Q M,W,F   clopidogrel  75 mg Oral Q breakfast   cycloSPORINE  100 mg Oral Daily   cycloSPORINE  75 mg Oral QHS   multivitamin with minerals  1 tablet Oral Daily   mycophenolate  250 mg Oral QHS   mycophenolate  500 mg Oral Daily   pantoprazole  40 mg Oral QHS   pravastatin  10 mg Oral q1800   sodium bicarbonate  650 mg Oral BID   sodium chloride flush  3 mL Intravenous Q12H   traZODone  100 mg Oral QHS   Continuous Infusions:  sodium chloride     sodium chloride     PRN Meds: sodium chloride, acetaminophen, nitroGLYCERIN, ondansetron (ZOFRAN) IV, oxyCODONE, sodium chloride, sodium chloride flush   Vital Signs    Vitals:   11/06/20 0421 11/06/20 0521 11/06/20 0720 11/06/20 0910  BP: 107/70 116/68 120/71 113/71  Pulse:  72 72 84  Resp: 15 14 13 19   Temp:  98.1 F (36.7 C) 98.1 F (36.7 C)   TempSrc:  Oral Oral   SpO2:  99% 100% 98%  Weight:      Height:       No intake or output data in the 24 hours ending 11/06/20 1012 Last 3 Weights 11/04/2020 10/13/2020 10/08/2020  Weight (lbs)  220 lb 228 lb 220 lb  Weight (kg) 99.791 kg 103.42 kg 99.791 kg      Telemetry    sr - Personally Reviewed  ECG    No new since 9/28 - Personally Reviewed  Physical Exam   GEN: No acute distress.  Resting comfortably in bed Neck: unable to visualize JVD due to body habitus Cardiac: RRR, no rubs, or gallops. 2/6 systolic murmur Respiratory: Clear to auscultation bilaterally anterior and laterally. GI: Soft, non-distended  MS: No edema; Tender to palpation over right lower abdomen/pannus. No tenderness in bilateral upper thigh. Small ecchymosis over R femoral area without bruit, soft. Large ecchymosis over left femoral area, though soft and nontender, no bruit Neuro:  Nonfocal  Psych: Normal affect   Labs    High Sensitivity Troponin:  No results for input(s): TROPONINIHS in the last 720 hours.   Chemistry Recent Labs  Lab 11/05/20 0728 11/05/20 1625 11/06/20 0218  NA 139 139 137  K 5.3* 5.0 5.5*  CL 115* 117* 117*  CO2 18* 17* 16*  GLUCOSE 101* 97 95  BUN 33* 33* 33*  CREATININE 2.29* 2.52* 2.41*  CALCIUM 8.0* 7.9* 7.9*  GFRNONAA 24* 21* 23*  ANIONGAP 6 5 4*    Lipids No results  for input(s): CHOL, TRIG, HDL, LABVLDL, LDLCALC, CHOLHDL in the last 168 hours.  Hematology Recent Labs  Lab 11/04/20 1950 11/04/20 2336 11/05/20 0728 11/05/20 1046 11/06/20 0331  WBC 14.4*  --  10.1  --  8.2  RBC 2.95*  --  2.95*  --  2.86*  HGB 8.9*   < > 8.7* 9.1* 8.5*  HCT 29.2*   < > 28.7* 30.1* 28.4*  MCV 99.0  --  97.3  --  99.3  MCH 30.2  --  29.5  --  29.7  MCHC 30.5  --  30.3  --  29.9*  RDW 13.2  --  14.0  --  14.9  PLT 241  --  220  --  220   < > = values in this interval not displayed.   Thyroid No results for input(s): TSH, FREET4 in the last 168 hours.  BNPNo results for input(s): BNP, PROBNP in the last 168 hours.  DDimer No results for input(s): DDIMER in the last 168 hours.   Radiology    CT ABDOMEN PELVIS WO CONTRAST  Result Date: 11/05/2020 CLINICAL  DATA:  Retroperitoneal hematoma EXAM: CT ABDOMEN AND PELVIS WITHOUT CONTRAST TECHNIQUE: Multidetector CT imaging of the abdomen and pelvis was performed following the standard protocol without IV contrast. COMPARISON:  10/08/2020 FINDINGS: Lower chest: The visualized lung bases are clear bilaterally. The visualized heart and pericardium are unremarkable. Hepatobiliary: Status post liver transplantation. Gallbladder has been excised. Donor aorta appears anastomosed to the infrarenal abdominal aorta. No definite intrahepatic mass on this noncontrast examination. No intrahepatic biliary ductal dilation. Pancreas: Stable appearance of the pancreas within the retroperitoneum in keeping with given history of pancreatic transplant. Spleen: Status post splenectomy small splenule again noted within the left upper quadrant. Adrenals/Urinary Tract: The adrenal glands are unremarkable. The kidneys are atrophic bilaterally, similar to prior examination. Previously noted nonobstructing renal calculi are somewhat obscured by contrast within the renal collecting system bilaterally, however, a a 9 mm dominant calculus is again identified within the upper pole the right kidney. No hydronephrosis. The bladder is unremarkable. 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 Anastomotic staple line noted within the distal transverse colon. Mild ascites is again noted. No focal inflammatory change. No free intraperitoneal gas. No evidence of obstruction. Vascular/Lymphatic: Aside from aorto-aortic anastomosis, the abdominal vasculature is unremarkable. No pathologic adenopathy within the abdomen and pelvis. Reproductive: Status post hysterectomy. No adnexal masses. Other: There is subcutaneous infiltration within the a inferior pannus and within the inguinal regions bilaterally likely representing subcutaneous hemorrhage in the setting of recent femoral  catheterization. No discrete hematoma identified. There is mild infiltration within the right retroperitoneum in keeping with a trace amount of retroperitoneal hemorrhage. Small right periumbilical ventral hernia containing a small amount of ascitic fluid, unchanged. Musculoskeletal: No acute bone abnormality. No lytic or blastic bone lesion. IMPRESSION: Moderate subcutaneous soft tissue infiltration within the inferior pannus and inguinal regions bilaterally in keeping with a moderate amount of subcutaneous interstitial hemorrhage in the setting of recent femoral catheterization. Trace retroperitoneal infiltrative hemorrhage. No discrete masslike hematoma identified. Surgical changes in keeping with reported liver, pancreas, stomach, small bowel, and partial large bowel transplantation. Status post splenectomy. Stable trace ascites. Stable nonobstructing bilateral nephrolithiasis. Electronically Signed   By: Fidela Salisbury M.D.   On: 11/05/2020 03:42   CARDIAC CATHETERIZATION  Result Date: 11/04/2020   Mid LAD lesion is 100% stenosed.   A drug-eluting stent  was successfully placed using a SYNERGY XD 2.50X32.   Post intervention, there is a 0% residual stenosis. Successful CTO PCI of the mid LAD with IVUS guidance and DES x 1 Plan: observe overnight with hydration. Monitor renal function closely. DAPT for at least 6 months. Anticipate DC tomorrow if stable.    Cardiac Studies   Echocardiogram 05/2020: 1. Left ventricular ejection fraction, by estimation, is 60 to 65%. The  left ventricle has normal function. The left ventricle has no regional  wall motion abnormalities. There is mild left ventricular hypertrophy.  Left ventricular diastolic parameters  were normal. Normal global longitudinal strain of -19.1%.   2. Right ventricular systolic function is normal. The right ventricular  size is normal. There is normal pulmonary artery systolic pressure. The  estimated right ventricular systolic pressure  is 95.0 mmHg.   3. Left atrial size was mildly dilated.   4. The mitral valve is grossly normal. Mild mitral valve regurgitation.   5. The aortic valve is tricuspid. Aortic valve regurgitation is not  visualized.   6. The inferior vena cava is normal in size with greater than 50%  respiratory variability, suggesting right atrial pressure of 3 mmHg.   Comparison(s): Echocardiogram done 02/27/17 ay [facility] showed an EF of 56%.    LHC 09/2020:   Mid LAD lesion is 100% stenosed.   Prox RCA lesion is 40% stenosed.   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  Refer for consideration of CTO intervention.  Postprocedural fluids.  Recheck metabolic panel next week as an outpatient.   CTO 11/04/20:   Mid LAD lesion is 100% stenosed.   A drug-eluting stent was successfully placed using a SYNERGY XD 2.50X32.   Post intervention, there is a 0% residual stenosis.   Successful CTO PCI of the mid LAD with IVUS guidance and DES x 1   Plan: observe overnight with hydration. Monitor renal function closely. DAPT for at least 6 months. Anticipate DC tomorrow if stable.   Patient Profile     60 y.o. female with PMH stomach/liver/pancreas/small bowel transplant at [facility] in the past (on immunosuppression), CAD, hypertension, hyperlipidemia, chronic kidney disease stage 4 who presented for PCI of CTO 11/04/20. Complicated by small RP bleed and acute kidney injury requiring further treatment.  Assessment & Plan    CAD s/p CTO PCI/DES to LAD 11/04/20 Acute blood loss anemia on anemia of chronic disease Small retroperitoneal bleed -the evening of the procedure, she had hypotension, chest pain, and was found to have small RP bleed -received 1 unit PRBC with appropriate incrementation -also given fluids (see below), H/H appropriate post transfusion -Continue aspirin and  plavix -Continue statin -has had significant R abdominal pain. This is above the site of access, in the groin. No firmness or ecchymosis in this area, but tender on light palpation. +BS. -recommend trying to ambulate. If she can do this comfortably, would aim for discharge later today -we discussed restrictions and home monitoring at length today -will need CBC post discharge for close monitoring   Acute post cath kidney injury on chronic kidney disease stage 4 -Cr rose from baseline 1.8>2.02>2.29>2.52>2.41.  -improved with IV fluids -will need BMET post discharge for close monitoring -K chronically around 5, 5.5 today (5.3, 5.4 prior this admission). Recheck as outpatient   History of hypertension, with hypotension post procedure  -holding home furosemide, isosorbide, lisinopril -re-evaluate  blood pressure as an outpatient   HLD: LDL 78 08/2020 on fluvastatin at home - Continue pravastatin this admission - Consider referral to lipid clinic if LDL not at goal to discuss other therapeutic options   S/p stomach/liver/pancreas/small bowel transplant: on immunosuppressives - Continue cyclosporine, mycophenolate -was on prednisone only short term for contrast allergy, not continued  Total time of encounter: 50 minutes total time of encounter, including 45 minutes spent in face-to-face patient care. This time includes coordination of care and counseling regarding symptoms, management, recommendations given CAD and RP bleed. Remainder of non-face-to-face time involved reviewing chart documents/testing relevant to the patient encounter and documentation in the medical record.  Buford Dresser, MD, PhD, [place]    For questions or updates, please contact [place] Please consult [email or web address] for contact info under        Signed, Buford Dresser, MD  11/06/2020, 10:12 AM

## 2020-11-06 NOTE — Discharge Summary (Signed)
Discharge Summary    Patient ID: [name] MRN: [id number]; DOB: [birth year]  Admit date: 11/04/2020 Discharge date: 11/06/2020  PCP:  Redmond School, MD   [facility] Providers Cardiologist:  Rozann Lesches, MD        Discharge Diagnoses    Principal Problem:   Coronary artery disease involving native coronary artery with angina pectoris [facility]) Active Problems:   Cirrhosis of liver ([place])   Acute blood loss anemia   Thrombocytopenia ([place])   Diabetes mellitus type 2, controlled, without complications [facility])   Liver transplant recipient [facility])   Immunosuppressed status ([place])   CAD (coronary artery disease)   Retroperitoneal hemorrhage complicating cardiac catheterization, small   S/P angioplasty CTO with stent  (DES) 11/04/20 of mid LAD    Diagnostic Studies/Procedures    CTO 11/04/20:   Mid LAD lesion is 100% stenosed.   A drug-eluting stent was successfully placed using a SYNERGY XD 2.50X32.   Post intervention, there is a 0% residual stenosis.   Successful CTO PCI of the mid LAD with IVUS guidance and DES x 1   Plan: observe overnight with hydration. Monitor renal function closely. DAPT for at least 6 months. Anticipate DC tomorrow if stable.  _____________   History of Present Illness     [name] is a 60 y.o. 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  Unfortunately she continued with unstable progressive angina despite medication, she did undergo Cath 09/24/20 and found to have mid LAD lesion, 100% stenosis, prox RCA 40%, she was loaded with plavix with plans to return for CTO intervention.    Prior to this visit she had acute cystitis and hematuria along with diverticulitis and treated.    She presented 11/04/20 and underwent CTO intervention with Dr. Martinique  that was successful.   She was admitted.  Hospital Course     Consultants: none   Post procedure she had vaso vagal symptoms pulling sheath.  + chest pain with no EKG changes.   She was admitted overnight.  During the night BP soft and she did have drop in hgb.  She had abd pain, CT of abd with very small retroperitoneal bleed.  She was transfused 1 unit PRBCs.   She had bed rest for a day and by 11/06/20 she was stable.  She waked in the hall with some pain but able to ambulate.   She was seen and evaluated by Dr. Harrell Gave and found stable for discharge.  Her  BP was soft so lasix, imdur, lisinopril and BB held.  She continues with  ASA and plavix.  Along with her new statin pravachol and her immunosuppressants.  Outpt follow ups have been arranged.     Did the patient have an acute coronary syndrome (MI, NSTEMI, STEMI, etc) this admission?:  No                               Did the patient have a percutaneous coronary intervention (stent / angioplasty)?:  Yes.     Cath/PCI Registry Performance & Quality Measures: Aspirin prescribed? - Yes ADP Receptor Inhibitor (Plavix/Clopidogrel, Brilinta/Ticagrelor or Effient/Prasugrel) prescribed (includes medically managed patients)? - Yes High Intensity Statin (Lipitor 40-80mg  or Crestor 20-40mg ) prescribed? - No - intolerant to statins For EF <40%,  was ACEI/ARB prescribed? - No - Reason:  holding for hypotension For EF <40%, Aldosterone Antagonist (Spironolactone or Eplerenone) prescribed? - Not Applicable (EF >/= 54%) Cardiac Rehab Phase II ordered? - Yes      _____________  Discharge Vitals Blood pressure 113/71, pulse 84, temperature 98.1 F (36.7 C), temperature source Oral, resp. rate 19, height 5\' 6"  (1.676 m), weight 99.8 kg, SpO2 98 %.  Filed Weights   11/04/20 0709  Weight: 99.8 kg    Labs & Radiologic Studies    CBC Recent Labs    11/05/20 0728 11/05/20 1046 11/06/20 0331  WBC 10.1  --  8.2  HGB 8.7* 9.1* 8.5*  HCT  28.7* 30.1* 28.4*  MCV 97.3  --  99.3  PLT 220  --  627   Basic Metabolic Panel Recent Labs    11/05/20 1625 11/06/20 0218  NA 139 137  K 5.0 5.5*  CL 117* 117*  CO2 17* 16*  GLUCOSE 97 95  BUN 33* 33*  CREATININE 2.52* 2.41*  CALCIUM 7.9* 7.9*   Liver Function Tests No results for input(s): AST, ALT, ALKPHOS, BILITOT, PROT, ALBUMIN in the last 72 hours. No results for input(s): LIPASE, AMYLASE in the last 72 hours. High Sensitivity Troponin:   No results for input(s): TROPONINIHS in the last 720 hours.  BNP Invalid input(s): POCBNP D-Dimer No results for input(s): DDIMER in the last 72 hours. Hemoglobin A1C No results for input(s): HGBA1C in the last 72 hours. Fasting Lipid Panel No results for input(s): CHOL, HDL, LDLCALC, TRIG, CHOLHDL, LDLDIRECT in the last 72 hours. Thyroid Function Tests No results for input(s): TSH, T4TOTAL, T3FREE, THYROIDAB in the last 72 hours.  Invalid input(s): FREET3 _____________  CT ABDOMEN PELVIS WO CONTRAST  Result Date: 11/05/2020 CLINICAL DATA:  Retroperitoneal hematoma EXAM: CT ABDOMEN AND PELVIS WITHOUT CONTRAST TECHNIQUE: Multidetector CT imaging of the abdomen and pelvis was performed following the standard protocol without IV contrast. COMPARISON:  10/08/2020 FINDINGS: Lower chest: The visualized lung bases are clear bilaterally. The visualized heart and pericardium are unremarkable. Hepatobiliary: Status post liver transplantation. Gallbladder has been excised. Donor aorta appears anastomosed to the infrarenal abdominal aorta. No definite intrahepatic mass on this noncontrast examination. No intrahepatic biliary ductal dilation. Pancreas: Stable appearance of the pancreas within the retroperitoneum in keeping with given history of pancreatic transplant. Spleen: Status post splenectomy small splenule again noted within the left upper quadrant. Adrenals/Urinary Tract: The adrenal glands are unremarkable. The kidneys are atrophic  bilaterally, similar to prior examination. Previously noted nonobstructing renal calculi are somewhat obscured by contrast within the renal collecting system bilaterally, however, a a 9 mm dominant calculus is again identified within the upper pole the right kidney. No hydronephrosis. The bladder is unremarkable. 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 Anastomotic staple line noted within the distal transverse colon. Mild ascites is again noted. No focal inflammatory change. No free intraperitoneal gas. No evidence of obstruction. Vascular/Lymphatic: Aside from aorto-aortic anastomosis, the abdominal vasculature is unremarkable. No pathologic adenopathy within the abdomen and pelvis. Reproductive: Status post hysterectomy. No adnexal masses. Other: There is subcutaneous infiltration within the a inferior pannus and within the inguinal regions bilaterally likely representing subcutaneous hemorrhage in the setting of recent femoral catheterization. No discrete hematoma identified. There is mild infiltration within the right retroperitoneum in keeping with a trace amount of retroperitoneal hemorrhage. Small right periumbilical ventral hernia containing a small amount of ascitic fluid, unchanged.  Musculoskeletal: No acute bone abnormality. No lytic or blastic bone lesion. IMPRESSION: Moderate subcutaneous soft tissue infiltration within the inferior pannus and inguinal regions bilaterally in keeping with a moderate amount of subcutaneous interstitial hemorrhage in the setting of recent femoral catheterization. Trace retroperitoneal infiltrative hemorrhage. No discrete masslike hematoma identified. Surgical changes in keeping with reported liver, pancreas, stomach, small bowel, and partial large bowel transplantation. Status post splenectomy. Stable trace ascites. Stable nonobstructing bilateral nephrolithiasis. Electronically  Signed   By: Fidela Salisbury M.D.   On: 11/05/2020 03:42   CARDIAC CATHETERIZATION  Result Date: 11/04/2020   Mid LAD lesion is 100% stenosed.   A drug-eluting stent was successfully placed using a SYNERGY XD 2.50X32.   Post intervention, there is a 0% residual stenosis. Successful CTO PCI of the mid LAD with IVUS guidance and DES x 1 Plan: observe overnight with hydration. Monitor renal function closely. DAPT for at least 6 months. Anticipate DC tomorrow if stable.   CT Renal Stone Study  Result Date: 10/08/2020 CLINICAL DATA:  60 year old female with history of lower abdominal pain and hematuria. EXAM: CT ABDOMEN AND PELVIS WITHOUT CONTRAST TECHNIQUE: Multidetector CT imaging of the abdomen and pelvis was performed following the standard protocol without IV contrast. COMPARISON:  CT of the abdomen and pelvis 03/11/2017. FINDINGS: Lower chest: Mild cardiomegaly. Hepatobiliary: Status post liver transplant. No suspicious cystic or solid hepatic lesions are confidently identified on today's noncontrast CT examination. Liver has a slightly nodular contour, which may suggest underlying cirrhosis. Gallbladder is not confidently identified, presumably surgically absent (no surgical clips are noted in the gallbladder fossa). Pancreas: Unusual appearance/position of the pancreas related to reported pancreatic transplant. No definite pancreatic mass or peripancreatic fluid collections or inflammatory changes are noted on today's noncontrast CT examination. Spleen: Large splenule in the left upper quadrant of the abdomen. Spleen otherwise appears to be surgically absent. Adrenals/Urinary Tract: Multiple nonobstructive calculi are noted within the collecting systems of both kidneys measuring up to 9 mm in the upper pole collecting system of the right kidney. No definite ureteral or bladder stones. No hydroureteronephrosis. Kidneys are moderately atrophic bilaterally. Unenhanced appearance of the urinary bladder is  unremarkable. The bilateral adrenal glands are normal in appearance. Stomach/Bowel: Per medical records, the patient is status post transplant of the stomach, pancreas, small intestine and portion of the large intestine. Within the limitations of today's noncontrast CT examination, the unenhanced appearance of the stomach is normal. There is no pathologic dilatation of small bowel or colon. Numerous colonic diverticulae are noted, most evident in the sigmoid colon, where there is some focal mural thickening and surrounding inflammatory changes in the proximal sigmoid colon, which could indicate acute diverticulitis. Vascular/Lymphatic: Atherosclerosis in the abdominal aorta. No lymphadenopathy noted in the abdomen or pelvis. Reproductive: Status post hysterectomy. Ovaries are not confidently identified may be surgically absent or atrophic. Trace amount of gas in the vaginal apex. Other: No significant volume of ascites.  No pneumoperitoneum. Musculoskeletal: There are no aggressive appearing lytic or blastic lesions noted in the visualized portions of the skeleton. IMPRESSION: 1. Colonic diverticulosis, most evident in the sigmoid colon where there is some focal mural thickening and subtle surrounding inflammatory changes concerning for early or mild acute diverticulitis. 2. Multiple nonobstructive calculi are noted in the collecting systems of both kidneys measuring up to 9 mm in the upper pole collecting system of the right kidney. No ureteral stones or findings of urinary tract obstruction. 3. Status post multi organ transplant (liver, pancreas, stomach,  small bowel and portion of the colon). The liver has a slightly nodular contour, concerning for developing cirrhosis. 4. Aortic atherosclerosis. 5. Mild cardiomegaly. 6. Additional incidental findings, as above. Electronically Signed   By: Vinnie Langton M.D.   On: 10/08/2020 20:46   Disposition   Pt is being discharged home today in good  condition.  Follow-up Plans & Appointments   If you need ER--med center [facility] located in [place] [address] at [facility] [employer] [place]  [id number]  Heart healthy diet   Call the office if problems   Call [facility] at [phone] if any bleeding, swelling or drainage at cath site.  May shower, no tub baths for 48 hours for groin sticks. No lifting over 5 pounds for 5 days.  No Driving for 5 days if you drive  Walk some every day with someone with you but no strenuous activity.     Follow-up Information     Verta Ellen., NP Follow up on 11/16/2020.   Specialty: Cardiology Why: at 11:00 AM Contact information: [place] [address] [phone]         Satira Sark, MD Follow up on 12/28/2020.   Specialty: Cardiology Why: at 3:00 PM Contact information: [place] [address] [phone]                Discharge Instructions     Amb Referral to Cardiac Rehabilitation   Complete by: As directed    Diagnosis:  Coronary Stents PTCA     After initial evaluation and assessments completed: Virtual Based Care may be provided alone or in conjunction with Phase 2 Cardiac Rehab based on patient barriers.: Yes       Discharge Medications   Allergies as of 11/06/2020       Reactions   Ancef [cefazolin Sodium] Other (See Comments)   Reaction:  Vaginal and mouth blisters   Tapentadol    Pt unsure of allergy   Gadobenate Itching, Nausea And Vomiting, Rash   Iodinated Diagnostic Agents Itching, Nausea And Vomiting, Rash   Tape Rash        Medication List     STOP taking these medications    atenolol 25 MG tablet Commonly known as: TENORMIN   fluvastatin 20 MG capsule Commonly known as: LESCOL Replaced by: pravastatin 10 MG tablet   furosemide 20 MG tablet Commonly known as: LASIX   isosorbide mononitrate 30 MG 24 hr tablet Commonly known  as: IMDUR   lisinopril 2.5 MG tablet Commonly known as: ZESTRIL   predniSONE 50 MG tablet Commonly known as: DELTASONE       TAKE these medications    acetaminophen 325 MG tablet Commonly known as: TYLENOL Take 2 tablets (650 mg total) by mouth every 4 (four) hours as needed for headache or mild pain.   aspirin EC 81 MG tablet Take 1 tablet (81 mg total) by mouth daily. Swallow whole.   calcitRIOL 0.25 MCG capsule Commonly known as: ROCALTROL Take 0.25 mcg by mouth every Monday, Wednesday, and Friday.   clopidogrel 75 MG tablet Commonly known as: Plavix Take 1 tablet (75 mg total) by mouth daily. What changed: when to take this   cycloSPORINE 100 MG capsule Commonly known as: SANDIMMUNE Take 100 mg by mouth daily.   cycloSPORINE 25 MG capsule Commonly known as: SANDIMMUNE Take 75 mg by mouth at bedtime.   mycophenolate 250  MG capsule Commonly known as: CELLCEPT Take 250-500 mg by mouth See admin instructions. Take 500 mg in the morning. 250 mg at bedtime   nitroGLYCERIN 0.4 MG SL tablet Commonly known as: NITROSTAT Place 1 tablet (0.4 mg total) under the tongue every 5 (five) minutes as needed.   ondansetron 4 MG tablet Commonly known as: ZOFRAN Take 1 tablet (4 mg total) by mouth every 6 (six) hours. As needed for nausea/vomiting   Oxycodone HCl 10 MG Tabs Take 10 mg by mouth every 6 (six) hours as needed (pain).   pantoprazole 40 MG tablet Commonly known as: PROTONIX Take 40 mg by mouth at bedtime.   pravastatin 10 MG tablet Commonly known as: PRAVACHOL Take 1 tablet (10 mg total) by mouth daily at 6 PM. Replaces: fluvastatin 20 MG capsule   sodium bicarbonate 650 MG tablet Take 650 mg by mouth 2 (two) times daily.   SUPER THERA VITE M PO Take 1 tablet by mouth daily.   traZODone 50 MG tablet Commonly known as: DESYREL Take 100 mg by mouth at bedtime.           Outstanding Labs/Studies   CBC, BMP at office visit and in 6-8 weeks hepatic  and lipids   Duration of Discharge Encounter   Greater than 30 minutes including physician time.  Signed, Cecilie Kicks, NP 11/06/2020, 3:32 PM

## 2020-11-06 NOTE — Progress Notes (Signed)
CARDIAC REHAB PHASE I   PRE:  Rate/Rhythm: 86 SR    BP: sitting 98/34    SaO2: 95 RA  MODE:  Ambulation: 100 ft   POST:  Rate/Rhythm: 105 ST    BP: sitting 97/76     SaO2: 97 RA  Pt sts 7/10 right groin pain, just received meds. Able to move herself to EOB, stand, pivot to [facility]. Cleaned herself and slowly walked with RW in hall, pain manageable, VSS. Did have brief feeling of being hot. To recliner, BP stable.   Discussed with pt and family stent, Plavix importance, restrictions, diet, exercise, NTG and CRPII. Pt receptive. Will refer to [place]. She has a RW at home if she needs it. [phone]   [place], ACSM 11/06/2020 11:31 AM

## 2020-11-15 NOTE — Progress Notes (Signed)
Cardiology Office Note  Date: 11/16/2020   ID: [name], [name] 1961/01/17, MRN [id number]  PCP:  Redmond School, MD  Cardiologist:  Rozann Lesches, MD Electrophysiologist:  None   Chief Complaint: Follow-up post CTO PCI of LAD with DES x1  History of Present Illness: [name] is a 60 y.o. female with a history of CAD, CKD, esophageal varices, gastric ulcer, portal hypertensive enteropathy.  She was last seen by Dr. Domenic Polite on 09/22/2020 for follow-up.  Despite medical therapy she had progressive exertional fatigue and lack of stamina with intermittent chest tightness.  She was being managed for ischemic heart disease based on abnormal Lexiscan Myoview with moderate peri-infarct ischemia in LAD distribution and EF of greater than 65%.  Cardiac catheterization had not been pursued given history of CKD stage IV and prior multivisceral transplant including liver, pancreas, small bowel 2018 at [facility] on immunosuppressive therapy.  Up titration of Imdur was discussed.  Plan was to have Dr. Burt Knack see her for interventional cardiology opinion regarding feasibility of PCI.  Her creatinine was 1.96 on review of recent lab work.  She continued reporting progressive angina and lack of stamina in spite of medical therapy.  Plan was to increase Imdur to 15 mg a.m. and 30 mg p.m.  She was continuing immunosuppressant therapy for previous liver, pancreas, and small bowel transplant in 2018 at [facility].  She saw Dr. Burt Knack on 09/24/2020.  Dr. Burt Knack suspected she had critical coronary stenosis.  Plan was for cardiac catheterization.  Cardiac catheterization on 09/25/2020 revealed mid LAD lesion 100% stenosis.  Proximal RCA lesion 40% stenosis.  Plan was to load with clopidogrel and refer for consideration of CTO intervention. CTO intervention scheduled for 11/04/2020 Dr. Martinique  She recently noticed hematuria and abdominal pain.  Urinalysis CBC, urine culture, CMP, renal stone study were ordered.   She presented to [facility] emergency department on 10/08/2020.  She was diagnosed with acute cystitis with hematuria and diverticulitis. She was treated with Cipro, Fluconazole, and Metronidazole.  She is here for follow-up after recent cardiac catheterization.  She states she has a significant amount of fatigue.  She states sometimes when she raises her arms up during her shower she has some chest tightness.  She also has some occasional fluttering sensation in her heart.  Otherwise she denies any current anginal symptoms or nitroglycerin use.  Denies any complications from a cardiac catheterization and states right radial access site looks good.  Blood pressure today 115/78 heart rate of 61.  Denies any issues after starting Plavix.  Current cardiac regimen includes aspirin 81 mg daily, atenolol 25 mg p.o. twice daily, Plavix 75 mg daily, Lasix every Monday, Wednesday, Friday.  Imdur 15 mg a.m. and 30 mg p.m.  Sublingual nitroglycerin as needed, Lescol 20 mg daily.  She is scheduled for CTO intervention 11/04/2020 Dr. Martinique.  She continues Cipro, fluconazole and metronidazole for UTI and diverticulitis.  Recent renal function on 10/08/2020 with creatinine 1.91 and GFR of 30.   She underwent CTO intervention to LAD with DES by Dr Martinique. She had a minor retroperitoneal bleed with drop in Hgb. She was transfused 1 unit PRBCs. Her BP was soft. Lasix, Imdur, Lisinopril,and BB were held. She continued ASA and Plavix, statin.   She is here today today for follow-up for CTO intervention to the LAD by Dr. Martinique on 11/04/2020.  She continues to complain of chest pain after the fact.  She states the pain can occur with or  without activity.  States it feels like a heaviness on her chest.  Also describes a burning sensation in her chest as well.  Also complaining of significant right groin pain at cath site.  States groin pain is worse with activity.  She states she had a significant amount of bruising on the left groin  access site for catheterization but this has improved some.  She is concerned about continuing chest pain and right groin pain.  She states 4 medications were stopped at discharge.  States she is having some palpitations with associated chest pain.  Status post PCI to CTO LAD with Stent. Retroperitoneal bleed. Needs CBC, BMP at follow up and FLP / LFT 6-8 weeks  Past Medical History:  Diagnosis Date   Anemia    Anxiety    Ascites    CKD (chronic kidney disease) stage 4, GFR 15-29 ml/min (HCC)    Depression    Enteropathy 07/31/2015   Portal hypertensive enteropathy per capsule study, with stigmata of bleeding.   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   [facility]   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   [facility]   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   Type 2 diabetes mellitus ([place])     Past Surgical History:  Procedure Laterality Date   ABDOMINAL HYSTERECTOMY  2003   [place]   COLONOSCOPY     COLONOSCOPY  10/21/2011   Procedure: COLONOSCOPY;  Surgeon: Rogene Houston, MD;  [facility];  Service: Endoscopy;  Laterality: N/A;  245    CORONARY CTO INTERVENTION N/A 11/04/2020   Procedure: CORONARY CTO INTERVENTION;  Surgeon: Martinique, Peter M, MD;  Location: [place] CV LAB;  Service: Cardiovascular;  Laterality: N/A;   CYSTOSCOPY W/ RETROGRADES   12/08/2011   Procedure: CYSTOSCOPY WITH RETROGRADE PYELOGRAM;  Surgeon: Marissa Nestle, MD;  Location: [facility] ORS;  Service: Urology;  Laterality: Right;   CYSTOSCOPY WITH STENT PLACEMENT  12/08/2011   Procedure: CYSTOSCOPY WITH STENT PLACEMENT;  Surgeon: Marissa Nestle, MD;  Location: [facility] OR[facility];  Service: Urology;  Laterality: Right;   DILATION AND CURETTAGE OF UTERUS     ESOPHAGOGASTRODUODENOSCOPY N/A 02/19/2014   Procedure: ESOPHAGOGASTRODUODENOSCOPY (EGD);  Surgeon: Rogene Houston, MD;  [facility];  Service: Endoscopy;  Laterality: N/A;  100   ESOPHAGOGASTRODUODENOSCOPY N/A 07/15/2015   Procedure: ESOPHAGOGASTRODUODENOSCOPY (EGD);  Surgeon: Rogene Houston, MD;  [facility];  Service: Endoscopy;  Laterality: N/A;   ESOPHAGOGASTRODUODENOSCOPY N/A 07/29/2015   Procedure: ESOPHAGOGASTRODUODENOSCOPY (EGD);  Surgeon: Rogene Houston, MD;  [facility];  Service: Endoscopy;  Laterality: N/A;   GASTRIC BYPASS  ~ [place]  04/14/11   "one in my bellybutton" (11/07/2012)   INGUINAL HERNIA REPAIR Right    "maybe 2" (11/07/2012)   KNEE ARTHROSCOPY Right  LEFT HEART CATH AND CORONARY ANGIOGRAPHY N/A 09/25/2020   Procedure: LEFT HEART CATH AND CORONARY ANGIOGRAPHY;  Surgeon: Sherren Mocha, MD;  Location: [place] [facility];  Service: Cardiovascular;  Laterality: N/A;   LITHOTRIPSY     "several times" (11/07/2012)   OPEN REDUCTION INTERNAL FIXATION (ORIF) DISTAL RADIAL FRACTURE Right 11/07/2012   Procedure: OPEN REDUCTION INTERNAL FIXATION (ORIF) RIGHT DISTAL RADIAL FRACTURE;  Surgeon: [name], MD;  Location: [place];  Service: Orthopedics;  Laterality: Right;   ORIF DISTAL RADIUS FRACTURE Right 11/07/2012   OTHER SURGICAL HISTORY  08/14/2016   5 organ transplant- liver, stomach, pancreas, small and large intestine   UPPER GASTROINTESTINAL ENDOSCOPY      Current Outpatient Medications  Medication Sig Dispense Refill   aspirin EC 81 MG tablet Take 1  tablet (81 mg total) by mouth daily. Swallow whole. 90 tablet 3   atenolol (TENORMIN) 25 MG tablet Take 0.5 tablets (12.5 mg total) by mouth daily. 45 tablet 3   calcitRIOL (ROCALTROL) 0.25 MCG capsule Take 0.25 mcg by mouth every Monday, Wednesday, and Friday.     clopidogrel (PLAVIX) 75 MG tablet Take 1 tablet (75 mg total) by mouth daily. (Patient taking differently: Take 75 mg by mouth at bedtime.) 30 tablet 11   cycloSPORINE (SANDIMMUNE) 100 MG capsule Take 100 mg by mouth daily.     cycloSPORINE (SANDIMMUNE) 25 MG capsule Take 75 mg by mouth at bedtime.     isosorbide mononitrate (IMDUR) 30 MG 24 hr tablet Take 1 tablet (30 mg total) by mouth daily. 90 tablet 3   Multiple Vitamins-Minerals (SUPER THERA VITE M PO) Take 1 tablet by mouth daily.     mycophenolate (CELLCEPT) 250 MG capsule Take 250-500 mg by mouth See admin instructions. Take 500 mg in the morning. 250 mg at bedtime     nitroGLYCERIN (NITROSTAT) 0.4 MG SL tablet Place 1 tablet (0.4 mg total) under the tongue every 5 (five) minutes as needed. 25 tablet 3   Oxycodone HCl 10 MG TABS Take 10 mg by mouth every 6 (six) hours as needed (pain).     pantoprazole (PROTONIX) 40 MG tablet Take 40 mg by mouth at bedtime.     pravastatin (PRAVACHOL) 10 MG tablet Take 1 tablet (10 mg total) by mouth daily at 6 PM. 30 tablet 6   sodium bicarbonate 650 MG tablet Take 650 mg by mouth 2 (two) times daily.     traZODone (DESYREL) 50 MG tablet Take 100 mg by mouth at bedtime.     acetaminophen (TYLENOL) 325 MG tablet Take 2 tablets (650 mg total) by mouth every 4 (four) hours as needed for headache or mild pain. (Patient not taking: Reported on 11/16/2020)     ondansetron (ZOFRAN) 4 MG tablet Take 1 tablet (4 mg total) by mouth every 6 (six) hours. As needed for nausea/vomiting (Patient not taking: No sig reported) 12 tablet 0   No current facility-administered medications for this visit.   Allergies:  Ancef [cefazolin sodium], Tapentadol,  Gadobenate, Iodinated diagnostic agents, and Tape   Social History: The patient  reports that she quit smoking about 29 years ago. Her smoking use included cigarettes. She has a 2.50 pack-year smoking history. She has never used smokeless tobacco. She reports that she does not drink alcohol and does not use drugs.   Family History: The patient's family history includes Dementia in her father and mother; Diabetes in her father; Heart disease in her father; Hypertension in her brother and  father; Liver disease in her father.   ROS:  Please see the history of present illness. Otherwise, complete review of systems is positive for none.  All other systems are reviewed and negative.   Physical Exam: VS:  BP 122/84   Pulse 86   Ht 5\' 5"  (1.651 m)   Wt 229 lb (103.9 kg)   SpO2 97%   BMI 38.11 kg/m , BMI Body mass index is 38.11 kg/m.  Wt Readings from Last 3 Encounters:  11/16/20 229 lb (103.9 kg)  11/04/20 220 lb (99.8 kg)  10/13/20 228 lb (103.4 kg)    General: Patient appears comfortable at rest. Neck: Supple, no elevated JVP or carotid bruits, no thyromegaly. Lungs: Clear to auscultation, nonlabored breathing at rest. Cardiac: Regular rate and rhythm, no S3 or significant systolic murmur, no pericardial rub. Extremities: No pitting edema, decreased femoral pulses on right and left status post cardiac catheterization with access sites and left and right groin. Skin: Warm and dry.   Musculoskeletal: No kyphosis. Neuropsychiatric: Alert and oriented x3, affect grossly appropriate.  ECG:  EKG September 24, 2020 sinus bradycardia rate of 50.  Recent Labwork: 10/08/2020: ALT 9; AST 11 11/06/2020: BUN 33; Creatinine, Ser 2.41; Hemoglobin 8.5; Platelets 220; Potassium 5.5; Sodium 137     Component Value Date/Time   CHOL 136 08/27/2020 0849   TRIG 101 08/27/2020 0849   HDL 39 (L) 08/27/2020 0849   CHOLHDL 3.5 08/27/2020 0849   LDLCALC 78 08/27/2020 0849    Other Studies Reviewed Today:    11/04/2020.  Cardiac catheterization PCI / LAD Conclusion      Mid LAD lesion is 100% stenosed.   A drug-eluting stent was successfully placed using a SYNERGY XD [id number].   Post intervention, there is a 0% residual stenosis.   Successful CTO PCI of the mid LAD with IVUS guidance and DES x 1   Plan: observe overnight with hydration. Monitor renal function closely. DAPT for at least 6 months. Anticipate DC tomorrow if stable  Diagnostic Dominance: Right Intervention     Cardiac catheterization 09/25/2020 Procedures  LEFT HEART CATH AND CORONARY ANGIOGRAPHY   Conclusion      Mid LAD lesion is 100% stenosed.   Prox RCA lesion is 40% stenosed.   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  Refer for consideration of CTO intervention.  Postprocedural fluids.  Recheck metabolic panel next week as an outpatient.   Diagnostic Dominance: Right         Echocardiogram 05/14/2020   1. Left ventricular ejection fraction, by estimation, is 60 to 65%. The  left ventricle has normal function. The left ventricle has no regional  wall motion abnormalities. There is mild left ventricular hypertrophy.  Left ventricular diastolic parameters  were normal. Normal global longitudinal strain of -19.1%.   2. Right ventricular systolic function is normal. The right ventricular  size is normal. There is normal pulmonary artery systolic pressure. The  estimated right ventricular systolic pressure is 03.5 mmHg.   3. Left atrial size was mildly dilated.   4. The mitral valve is grossly normal. Mild mitral valve regurgitation.   5. The aortic valve is tricuspid. Aortic valve regurgitation is not  visualized.   6. The inferior vena cava is normal in size with greater than 50%  respiratory variability, suggesting right atrial pressure of 3 mmHg.    Comparison(s): Echocardiogram  done 02/27/17 ay [facility] showed an EF of 56%.    Assessment and Plan:  1. CAD in native artery   2. CKD (chronic kidney disease), stage IV ([place])   3. Status post liver transplant ([place])   4. Right groin pain   5. Mixed hyperlipidemia   6. Palpitations     1. Coronary artery disease involving native coronary artery of native heart with angina pectoris [facility]) Recent cardiac catheterization demonstrated  CTO of mid LAD 100%.  She has collateral blood flow via septal perforators to LAD from right PDA.  She recently had successful intervention to CTO of LAD with DES with Dr. Martinique on 11/04/2020.  She continues with chest pain/chest pressure after cardiac catheterization and is concerned about continued chest pain.  Also complaining of chest pain/chest tightness when she takes a shower and raises her arms above her head.  Continues to complain of occasional fluttering in her chest and states chest pain is associated with fluttering in her chest..  She states that several medicines were stopped due to low blood pressures including Imdur, atenolol, lisinopril, and Lasix.  Continue aspirin 81 mg daily, Plavix 75 mg daily, restart atenolol 12.5 mg daily.  Restart Imdur 30 mg p.o. daily  2. CKD (chronic kidney disease), stage IV (HCC) Recent renal function status post cardiac catheterization creatinine of 2.41 and GFR of 23.  Please get a follow-up BMP.  3. Status post liver / pancreas / small bowel transplant [facility]),  Previous multivisceral transplant including liver, pancreas, small bowel, colon, stomach per patient in July 2018 [facility].  On immunosuppressant therapy  4.  Right groin pain with decreased femoral pulses status post cardiac catheterization.   Recent retroperitoneal bleed status post cardiac catheterization. Please get a bilateral lower extremity arterial study for right groin pain and decreased femoral pulses bilaterally status post recent cardiac catheterization  with left and right groin access and retroperitoneal bleed.  Get follow-up CBC and BMP.  5.  Hyperlipidemia Recently started pravastatin 10 mg daily.  Get FLP's and LFTs in 6 to 8 weeks.  6.  Palpitations Complaining of recent palpitations/fluttering sensation in her chest with associated chest pain when palpitations occur.  Please get a 14-day ZIO monitor to assess for arrhythmias.  Medication Adjustments/Labs and Tests Ordered: Current medicines are reviewed at length with the patient today.  Concerns regarding medicines are outlined above.   Disposition: Follow-up with Dr. Domenic Polite at scheduled visit  Signed, Levell July, NP 11/16/2020 11:45 AM    [facility] at [place], [place], [place] [address] Phone: [phone]; Fax: [phone]

## 2020-11-16 ENCOUNTER — Encounter: Payer: Self-pay | Admitting: Family Medicine

## 2020-11-16 ENCOUNTER — Ambulatory Visit: Payer: Medicare Other | Admitting: Family Medicine

## 2020-11-16 VITALS — BP 122/84 | HR 86 | Ht 65.0 in | Wt 229.0 lb

## 2020-11-16 DIAGNOSIS — I2510 Atherosclerotic heart disease of native coronary artery without angina pectoris: Secondary | ICD-10-CM | POA: Diagnosis not present

## 2020-11-16 DIAGNOSIS — R1031 Right lower quadrant pain: Secondary | ICD-10-CM

## 2020-11-16 DIAGNOSIS — Z944 Liver transplant status: Secondary | ICD-10-CM

## 2020-11-16 DIAGNOSIS — N184 Chronic kidney disease, stage 4 (severe): Secondary | ICD-10-CM

## 2020-11-16 DIAGNOSIS — R002 Palpitations: Secondary | ICD-10-CM

## 2020-11-16 DIAGNOSIS — E782 Mixed hyperlipidemia: Secondary | ICD-10-CM

## 2020-11-16 MED ORDER — ISOSORBIDE MONONITRATE ER 30 MG PO TB24: 30.0000 mg | ORAL_TABLET | Freq: Every day | ORAL | 3 refills | Status: DC

## 2020-11-16 MED ORDER — ATENOLOL 25 MG PO TABS: 12.5000 mg | ORAL_TABLET | Freq: Every day | ORAL | 3 refills | Status: DC

## 2020-11-16 NOTE — Patient Instructions (Signed)
Medication Instructions:  Your physician has recommended you make the following change in your medication:  START Atenolol 12.5 mg tablets once daily START Imdur 30 mg tablets once daily  *If you need a refill on your cardiac medications before your next appointment, please call your pharmacy*   Lab Work: IN 8 WEEKS: FASTING- Lipid/Hepatic panel CBC/BMET If you have labs (blood work) drawn today and your tests are completely normal, you will receive your results only by: [place] (if you have MyChart) OR A paper copy in the mail If you have any lab test that is abnormal or we need to change your treatment, we will call you to review the results.   Testing/Procedures: Bilateral Arterial Dopplers for Groin Pain   Follow-Up: At [facility], you and your health needs are our priority.  As part of our continuing mission to provide you with exceptional heart care, we have created designated Provider Care Teams.  These Care Teams include your primary Cardiologist (physician) and Advanced Practice Providers (APPs -  Physician Assistants and Nurse Practitioners) who all work together to provide you with the care you need, when you need it.  We recommend signing up for the patient portal called "MyChart".  Sign up information is provided on this After Visit Summary.  MyChart is used to connect with patients for Virtual Visits (Telemedicine).  Patients are able to view lab/test results, encounter notes, upcoming appointments, etc.  Non-urgent messages can be sent to your provider as well.   To learn more about what you can do with MyChart, go to [email or web address].    Your next appointment:   Keep your scheduled follow up appointment.    Other Instructions ZIO XT- Long Term Monitor Instructions   Your physician has requested you wear your ZIO patch monitor___14__days.   This is a single patch monitor.  [employer] supplies one patch monitor per enrollment.  Additional stickers  are not available.   Please do not apply patch if you will be having a Nuclear Stress Test, Echocardiogram, Cardiac CT, MRI, or Chest Xray during the time frame you would be wearing the monitor. The patch cannot be worn during these tests.  You cannot remove and re-apply the ZIO XT patch monitor.   Your ZIO patch monitor will be sent USPS Priority mail from [facility] directly to your home address. The monitor may also be mailed to a PO BOX if home delivery is not available.   It may take 3-5 days to receive your monitor after you have been enrolled.   Once you have received you monitor, please review enclosed instructions.  Your monitor has already been registered assigning a specific monitor serial # to you.   Applying the monitor   Shave hair from upper left chest.   Hold abrader disc by orange tab.  Rub abrader in 40 strokes over left upper chest as indicated in your monitor instructions.   Clean area with 4 enclosed alcohol pads .  Use all pads to assure are is cleaned thoroughly.  Let dry.   Apply patch as indicated in monitor instructions.  Patch will be place under collarbone on left side of chest with arrow pointing upward.   Rub patch adhesive wings for 2 minutes.Remove white label marked "1".  Remove white label marked "2".  Rub patch adhesive wings for 2 additional minutes.   While looking in a mirror, press and release button in center of patch.  A small green light will flash 3-4 times .  This will be your only indicator the monitor has been turned on.     Do not shower for the first 24 hours.  You may shower after the first 24 hours.   Press button if you feel a symptom. You will hear a small click.  Record Date, Time and Symptom in the Patient Log Book.   When you are ready to remove patch, follow instructions on last 2 pages of Patient Log Book.  Stick patch monitor onto last page of Patient Log Book.   Place Patient Log Book in [place] box.  Use locking tab on box  and tape box closed securely.  The [employer] and [employer] has [employer] on it.  Please place in mailbox as soon as possible.  Your physician should have your test results approximately 7 days after the monitor has been mailed back to [facility].   Call [place] at [phone] if you have questions regarding your ZIO XT patch monitor.  Call them immediately if you see an orange light blinking on your monitor.   If your monitor falls off in less than 4 days contact our Monitor department at [phone].  If your monitor becomes loose or falls off after 4 days call [employer] at [phone] for suggestions on securing your monitor.

## 2020-11-16 NOTE — Addendum Note (Signed)
Addended by: Christella Scheuermann C on: 11/16/2020 12:57 PM   Modules accepted: Orders

## 2020-11-18 DIAGNOSIS — D849 Immunodeficiency, unspecified: Secondary | ICD-10-CM | POA: Diagnosis not present

## 2020-11-18 DIAGNOSIS — Z944 Liver transplant status: Secondary | ICD-10-CM | POA: Diagnosis not present

## 2020-11-18 DIAGNOSIS — Z9482 Intestine transplant status: Secondary | ICD-10-CM | POA: Diagnosis not present

## 2020-11-19 ENCOUNTER — Other Ambulatory Visit: Payer: Self-pay

## 2020-11-19 ENCOUNTER — Ambulatory Visit (INDEPENDENT_AMBULATORY_CARE_PROVIDER_SITE_OTHER): Payer: Medicare Other

## 2020-11-19 DIAGNOSIS — R1031 Right lower quadrant pain: Secondary | ICD-10-CM | POA: Diagnosis not present

## 2020-11-19 DIAGNOSIS — I97630 Postprocedural hematoma of a circulatory system organ or structure following a cardiac catheterization: Secondary | ICD-10-CM | POA: Diagnosis not present

## 2020-11-20 DIAGNOSIS — G4733 Obstructive sleep apnea (adult) (pediatric): Secondary | ICD-10-CM | POA: Diagnosis not present

## 2020-11-20 DIAGNOSIS — I10 Essential (primary) hypertension: Secondary | ICD-10-CM | POA: Diagnosis not present

## 2020-11-23 DIAGNOSIS — R790 Abnormal level of blood mineral: Secondary | ICD-10-CM | POA: Diagnosis not present

## 2020-11-25 ENCOUNTER — Telehealth: Payer: Self-pay | Admitting: Cardiology

## 2020-11-25 ENCOUNTER — Other Ambulatory Visit: Payer: Self-pay | Admitting: Family Medicine

## 2020-11-25 ENCOUNTER — Ambulatory Visit: Payer: Medicare Other

## 2020-11-25 DIAGNOSIS — R002 Palpitations: Secondary | ICD-10-CM

## 2020-11-25 DIAGNOSIS — N184 Chronic kidney disease, stage 4 (severe): Secondary | ICD-10-CM

## 2020-11-25 DIAGNOSIS — Z944 Liver transplant status: Secondary | ICD-10-CM

## 2020-11-25 NOTE — Telephone Encounter (Signed)
Pt notified and verbalized understanding.

## 2020-11-25 NOTE — Telephone Encounter (Signed)
Monitor should arrive in a few days. Labs were given to pt on the day of her appt. Pt stated that she just had labs drawn at [facility] last Wednesday. I will request a copy of those labs. Pt is aware that if labs are not they correct labs that provider requested, pt will have to have those redrawn.

## 2020-11-25 NOTE — Telephone Encounter (Signed)
Pt has not received monitor yet and went to have lab work done at [facility] but they didn't have her orders   Please call [phone]

## 2020-11-25 NOTE — Telephone Encounter (Signed)
BMET/CBC in Care Everywhere. Pt will need to have Lipid/Hepatic drawn

## 2020-11-26 DIAGNOSIS — N184 Chronic kidney disease, stage 4 (severe): Secondary | ICD-10-CM | POA: Diagnosis not present

## 2020-11-26 DIAGNOSIS — Z944 Liver transplant status: Secondary | ICD-10-CM | POA: Diagnosis not present

## 2020-11-27 LAB — HEPATIC FUNCTION PANEL
ALT: 9 IU/L (ref 0–32)
AST: 11 IU/L (ref 0–40)
Albumin: 3.9 g/dL (ref 3.8–4.9)
Alkaline Phosphatase: 119 IU/L (ref 44–121)
Bilirubin Total: 0.3 mg/dL (ref 0.0–1.2)
Bilirubin, Direct: 0.17 mg/dL (ref 0.00–0.40)
Total Protein: 6 g/dL (ref 6.0–8.5)

## 2020-11-27 LAB — LIPID PANEL
Chol/HDL Ratio: 3.1 ratio (ref 0.0–4.4)
Cholesterol, Total: 135 mg/dL (ref 100–199)
HDL: 43 mg/dL (ref >39)
LDL Chol Calc (NIH): 76 mg/dL (ref 0–99)
Triglycerides: 85 mg/dL (ref 0–149)
VLDL Cholesterol Cal: 16 mg/dL (ref 5–40)

## 2020-12-03 ENCOUNTER — Telehealth: Payer: Self-pay | Admitting: *Deleted

## 2020-12-03 DIAGNOSIS — I10 Essential (primary) hypertension: Secondary | ICD-10-CM | POA: Diagnosis not present

## 2020-12-03 DIAGNOSIS — G894 Chronic pain syndrome: Secondary | ICD-10-CM | POA: Diagnosis not present

## 2020-12-03 DIAGNOSIS — I2510 Atherosclerotic heart disease of native coronary artery without angina pectoris: Secondary | ICD-10-CM | POA: Diagnosis not present

## 2020-12-03 MED ORDER — EZETIMIBE 10 MG PO TABS: 10.0000 mg | ORAL_TABLET | Freq: Every day | ORAL | 6 refills | Status: DC

## 2020-12-03 NOTE — Telephone Encounter (Signed)
LABS -  Please call the patient and let her know all her liver function tests and cholesterol numbers look good.  The LDL is just slightly elevated at 76.  Goal of LDL in anyone with coronary artery disease is less than 70.  Ask her if she would be willing to start Zetia which is a medication that keeps the gut from absorbing too much cholesterol which may get her to below the goal of 70.  If so start her on Zetia 10 mg daily.  Thank you   Verta Ellen, NP  11/27/2020 1:09 PM   Laurine Blazer, LPN  79/04/8331  8:32 PM EDT Back to Top    Notified, copy to pcp.  Will send new prescription to [place] now    [place] -   Please call the patient and let her know the arterial study did not show any pseudoaneurysm , arterial venous fistula, or deep vein thrombosis to explain the right groin pain. There was a CT scan performed on 11/05/2020 which showed a moderate subcutaneous tissue infiltration within the inferior pannus and inguinal areas bilaterally (lower abdomen and groin areas) which was consistent with interstitial hemorrhage (bleeding into that area) after her catheterization. If pain worsens or she has other symptoms would have her go to the emergency room .   Verta Ellen, NP  11/22/2020 4:01 AM   Laurine Blazer, LPN  91/91/6606  0:04 PM EDT Back to Top    Notified, copy to pcp.  States the is feeling better.

## 2020-12-08 ENCOUNTER — Other Ambulatory Visit: Payer: Self-pay | Admitting: Internal Medicine

## 2020-12-08 DIAGNOSIS — Z1231 Encounter for screening mammogram for malignant neoplasm of breast: Secondary | ICD-10-CM

## 2020-12-21 DIAGNOSIS — I10 Essential (primary) hypertension: Secondary | ICD-10-CM | POA: Diagnosis not present

## 2020-12-21 DIAGNOSIS — G4733 Obstructive sleep apnea (adult) (pediatric): Secondary | ICD-10-CM | POA: Diagnosis not present

## 2020-12-23 DIAGNOSIS — D098 Carcinoma in situ of other specified sites: Secondary | ICD-10-CM | POA: Diagnosis not present

## 2020-12-23 DIAGNOSIS — Z944 Liver transplant status: Secondary | ICD-10-CM | POA: Diagnosis not present

## 2020-12-23 DIAGNOSIS — D485 Neoplasm of uncertain behavior of skin: Secondary | ICD-10-CM | POA: Diagnosis not present

## 2020-12-23 DIAGNOSIS — C44712 Basal cell carcinoma of skin of right lower limb, including hip: Secondary | ICD-10-CM | POA: Diagnosis not present

## 2020-12-23 DIAGNOSIS — C419 Malignant neoplasm of bone and articular cartilage, unspecified: Secondary | ICD-10-CM | POA: Diagnosis not present

## 2020-12-23 DIAGNOSIS — Z9482 Intestine transplant status: Secondary | ICD-10-CM | POA: Diagnosis not present

## 2020-12-23 DIAGNOSIS — D489 Neoplasm of uncertain behavior, unspecified: Secondary | ICD-10-CM | POA: Diagnosis not present

## 2020-12-28 ENCOUNTER — Ambulatory Visit: Payer: Medicare Other | Admitting: Cardiology

## 2020-12-28 ENCOUNTER — Encounter: Payer: Self-pay | Admitting: Cardiology

## 2020-12-28 VITALS — BP 134/82 | HR 50 | Ht 66.0 in | Wt 220.8 lb

## 2020-12-28 DIAGNOSIS — N184 Chronic kidney disease, stage 4 (severe): Secondary | ICD-10-CM | POA: Diagnosis not present

## 2020-12-28 DIAGNOSIS — E782 Mixed hyperlipidemia: Secondary | ICD-10-CM

## 2020-12-28 DIAGNOSIS — I25119 Atherosclerotic heart disease of native coronary artery with unspecified angina pectoris: Secondary | ICD-10-CM

## 2020-12-28 MED ORDER — ATENOLOL 25 MG PO TABS: 12.5000 mg | ORAL_TABLET | Freq: Every day | ORAL | 3 refills | Status: DC

## 2020-12-28 MED ORDER — ISOSORBIDE MONONITRATE ER 30 MG PO TB24
15.0000 mg | ORAL_TABLET | Freq: Every day | ORAL | 0 refills | Status: DC
Start: 2020-12-28 — End: 2021-04-06

## 2020-12-28 NOTE — Patient Instructions (Addendum)
Medication Instructions:  Your physician has recommended you make the following change in your medication:  Decrease atenolol to 12.5 mg daily Decrease isosorbide mononitrate to 15 mg daily for 2 weeks, then stop it Continue other medications the same  Labwork: none  Testing/Procedures: none  Follow-Up: Your physician recommends that you schedule a follow-up appointment in: 3 months  Any Other Special Instructions Will Be Listed Below (If Applicable).  If you need a refill on your cardiac medications before your next appointment, please call your pharmacy.

## 2020-12-28 NOTE — Progress Notes (Signed)
Cardiology Office Note  Date: 12/28/2020   ID: Isebella, [place] [birth year], MRN [id number]  PCP:  Redmond School, MD  Cardiologist:  Rozann Lesches, MD Electrophysiologist:  None   Chief Complaint  Patient presents with   Cardiac follow-up    History of Present Illness: Elantra Caprara is a 60 y.o. female last seen in October by Mr. Leonides Sake NP.  I reviewed her interval history since our last encounter.  She does not report any active angina at this time, has been fatigued with activity.  She reports compliance with her medications as noted below and continues to follow at [facility].  Heart rate was in the 50s today after resumption of atenolol, we discussed reducing this to 12.5 mg daily.  We will also try to wean her off Imdur.  She otherwise reports compliance with aspirin and Plavix.  Hemoglobin was 8.5 in September with follow-up up to 9.4 in October at [facility].  Past Medical History:  Diagnosis Date   Anemia    Anxiety    Ascites    CKD (chronic kidney disease) stage 4, GFR 15-29 ml/min (HCC)    Depression    Enteropathy 07/31/2015   Portal hypertensive enteropathy per capsule study, with stigmata of bleeding.   Esophageal varices (HCC)    Gastric ulcer 07/15/2015   EGD; no stigmata of bleeding   GI bleed 07/17/2015   Gram-negative bacteremia 12/08/2011   Headache(784.0)    Heart murmur    History of cirrhosis    History of hepatitis C - successfuly treated medically 07/14/2015   Nephrolithiasis    Portal vein thrombosis    Retroperitoneal hemorrhage complicating cardiac catheterization, small 11/06/2020   Right ureteral stone 12/07/2011   S/P angioplasty CTO with stent  (DES) 11/04/20 of mid LAD 11/06/2020   Status [facility]   Status post pancreas [facility] ([place]) [facility]   [facility]   Status post small bowel [facility] ([place]) 08/2016   [facility]   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   Type 2 diabetes mellitus ([place])      Past Surgical History:  Procedure Laterality Date   ABDOMINAL HYSTERECTOMY  2003   [place]   COLONOSCOPY     COLONOSCOPY  10/21/2011   Procedure: COLONOSCOPY;  Surgeon: Rogene Houston, MD;  [facility];  Service: Endoscopy;  Laterality: N/A;  245    CORONARY CTO INTERVENTION N/A 11/04/2020   Procedure: CORONARY CTO INTERVENTION;  Surgeon: Martinique, Peter M, MD;  Location: [place] CV LAB;  Service: Cardiovascular;  Laterality: N/A;   CYSTOSCOPY W/ RETROGRADES  12/08/2011   Procedure: CYSTOSCOPY WITH RETROGRADE PYELOGRAM;  Surgeon: Marissa Nestle, MD;  Location: [facility] ORS;  Service: Urology;  Laterality: Right;   CYSTOSCOPY WITH STENT PLACEMENT  12/08/2011   Procedure: CYSTOSCOPY WITH STENT PLACEMENT;  Surgeon: Marissa Nestle, MD;  Location: [facility] OR[facility];  Service: Urology;  Laterality: Right;   DILATION AND CURETTAGE OF UTERUS     ESOPHAGOGASTRODUODENOSCOPY N/A 02/19/2014   Procedure: ESOPHAGOGASTRODUODENOSCOPY (EGD);  Surgeon: Rogene Houston, MD;  [facility];  Service: Endoscopy;  Laterality: N/A;  100   ESOPHAGOGASTRODUODENOSCOPY N/A 07/15/2015   Procedure: ESOPHAGOGASTRODUODENOSCOPY (EGD);  Surgeon: Rogene Houston, MD;  [facility];  Service: Endoscopy;  Laterality: N/A;   ESOPHAGOGASTRODUODENOSCOPY N/A 07/29/2015   Procedure: ESOPHAGOGASTRODUODENOSCOPY (EGD);  Surgeon: Rogene Houston, MD;  [facility];  Service: Endoscopy;  Laterality: N/A;   GASTRIC BYPASS  ~ [place]  04/14/11   "one in my bellybutton" (11/07/2012)   INGUINAL HERNIA REPAIR Right    "maybe 2" (11/07/2012)   KNEE ARTHROSCOPY Right    LEFT HEART CATH AND CORONARY ANGIOGRAPHY N/A 09/25/2020   Procedure: LEFT HEART CATH AND CORONARY ANGIOGRAPHY;  Surgeon: Sherren Mocha, MD;  Location: [place] CV LAB;  Service: Cardiovascular;  Laterality: N/A;   LITHOTRIPSY     "several times" (11/07/2012)   OPEN REDUCTION INTERNAL  FIXATION (ORIF) DISTAL RADIAL FRACTURE Right 11/07/2012   Procedure: OPEN REDUCTION INTERNAL FIXATION (ORIF) RIGHT DISTAL RADIAL FRACTURE;  Surgeon: [name], MD;  Location: [place];  Service: Orthopedics;  Laterality: Right;   ORIF DISTAL RADIUS FRACTURE Right 11/07/2012   OTHER SURGICAL HISTORY  08/14/2016   5 organ transplant- liver, stomach, pancreas, small and large intestine   UPPER GASTROINTESTINAL ENDOSCOPY      Current Outpatient Medications  Medication Sig Dispense Refill   acetaminophen (TYLENOL) 325 MG tablet Take 2 tablets (650 mg total) by mouth every 4 (four) hours as needed for headache or mild pain.     aspirin EC 81 MG tablet Take 1 tablet (81 mg total) by mouth daily. Swallow whole. 90 tablet 3   calcitRIOL (ROCALTROL) 0.25 MCG capsule Take 0.25 mcg by mouth every Monday, Wednesday, and Friday.     clopidogrel (PLAVIX) 75 MG tablet Take 1 tablet (75 mg total) by mouth daily. (Patient taking differently: Take 75 mg by mouth at bedtime.) 30 tablet 11   cycloSPORINE (SANDIMMUNE) 100 MG capsule Take 100 mg by mouth daily.     cycloSPORINE (SANDIMMUNE) 25 MG capsule Take 75 mg by mouth at bedtime.     ezetimibe (ZETIA) 10 MG tablet Take 1 tablet (10 mg total) by mouth daily. 30 tablet 6   Multiple Vitamins-Minerals (SUPER THERA VITE M PO) Take 1 tablet by mouth daily.     mycophenolate (CELLCEPT) 250 MG capsule Take 250-500 mg by mouth See admin instructions. Take 500 mg in the morning. 250 mg at bedtime     nitroGLYCERIN (NITROSTAT) 0.4 MG SL tablet Place 1 tablet (0.4 mg total) under the tongue every 5 (five) minutes as needed. 25 tablet 3   Oxycodone HCl 10 MG TABS Take 10 mg by mouth every 6 (six) hours as needed (pain).     pantoprazole (PROTONIX) 40 MG tablet Take 40 mg by mouth at bedtime.     pravastatin (PRAVACHOL) 10 MG tablet Take 1 tablet (10 mg total) by mouth daily at 6 PM. 30 tablet 6   sodium bicarbonate 650 MG tablet Take 650 mg by mouth 2 (two) times daily.      traZODone (DESYREL) 50 MG tablet Take 100 mg by mouth at bedtime.     atenolol (TENORMIN) 25 MG tablet Take 0.5 tablets (12.5 mg total) by mouth daily. 45 tablet 3   isosorbide mononitrate (IMDUR) 30 MG 24 hr tablet Take 0.5 tablets (15 mg total) by mouth daily for 15 days. For 2 weeks, then stop it 90 tablet 0   ondansetron (ZOFRAN) 4 MG tablet Take 1 tablet (4 mg total) by mouth every 6 (six) hours. As needed for nausea/vomiting (Patient not taking: Reported on 10/28/2020) 12 tablet 0   No current facility-administered medications for this visit.   Allergies:  Ancef [cefazolin sodium], Tapentadol, Gadobenate, Iodinated diagnostic agents, and Tape   ROS: No syncope.  Physical Exam: VS:  BP 134/82   Pulse (!) 50   Ht 5\' 6"  (1.676 m)   Wt 220 lb 12.8 oz (100.2 kg)   SpO2 97%   BMI 35.64 kg/m , BMI Body mass index is 35.64 kg/m.  Wt Readings from Last 3 Encounters:  12/28/20 220 lb 12.8 oz (100.2 kg)  11/16/20 229 lb (103.9 kg)  11/04/20 220 lb (99.8 kg)    General: Patient appears comfortable at rest. HEENT: Conjunctiva and lids normal, wearing a mask. Neck: Supple, no elevated JVP or carotid bruits, no thyromegaly. Lungs: Clear to auscultation, nonlabored breathing at rest. Cardiac: Regular rate and rhythm, no S3 or significant systolic murmur, no pericardial rub. Extremities: No pitting edema.  ECG:  An ECG dated 11/04/2020 was personally reviewed today and demonstrated:  Sinus bradycardia with lead motion artifact.  Recent Labwork: 11/06/2020: BUN 33; Creatinine, Ser 2.41; Hemoglobin 8.5; Platelets 220; Potassium 5.5; Sodium 137 11/26/2020: ALT 9; AST 11     Component Value Date/Time   CHOL 135 11/26/2020 1007   TRIG 85 11/26/2020 1007   HDL 43 11/26/2020 1007   CHOLHDL 3.1 11/26/2020 1007   LDLCALC 76 11/26/2020 1007    Other Studies Reviewed Today:  Echocardiogram 05/14/2020:  1. Left ventricular ejection fraction, by estimation, is 60 to 65%. The  left ventricle  has normal function. The left ventricle has no regional  wall motion abnormalities. There is mild left ventricular hypertrophy.  Left ventricular diastolic parameters  were normal. Normal global longitudinal strain of -19.1%.   2. Right ventricular systolic function is normal. The right ventricular  size is normal. There is normal pulmonary artery systolic pressure. The  estimated right ventricular systolic pressure is 14.7 mmHg.   3. Left atrial size was mildly dilated.   4. The mitral valve is grossly normal. Mild mitral valve regurgitation.   5. The aortic valve is tricuspid. Aortic valve regurgitation is not  visualized.   6. The inferior vena cava is normal in size with greater than 50%  respiratory variability, suggesting right atrial pressure of 3 mmHg.   CTO PCI 11/04/2020:   Mid LAD lesion is 100% stenosed.   A drug-eluting stent was successfully placed using a SYNERGY XD [id number].   Post intervention, there is a 0% residual stenosis.   Successful CTO PCI of the mid LAD with IVUS guidance and DES x 1  Abdominal and pelvic CT 11/05/2020: IMPRESSION: Moderate subcutaneous soft tissue infiltration within the inferior pannus and inguinal regions bilaterally in keeping with a moderate amount of subcutaneous interstitial hemorrhage in the setting of recent femoral catheterization. Trace retroperitoneal infiltrative hemorrhage. No discrete masslike hematoma identified.   Surgical changes in keeping with reported liver, pancreas, stomach, small bowel, and partial large bowel transplantation.   Status post splenectomy.   Stable trace ascites.   Stable nonobstructing bilateral nephrolithiasis.  Assessment and Plan:  1.  CAD status post CTO PCI with DES to the LAD in September.  This was complicated by acute blood loss anemia and a small retroperitoneal bleed.  She has done well since that time in terms of angina control.  Still fatigued which could be a function of anemia and also  bradycardia.  Her hemoglobin has been gradually increasing.  Plan to reduce atenolol to 12.5 mg daily.  We will also try to wean her off Imdur.  Otherwise continue aspirin, Plavix, Pravachol, and Zetia.  2.  CKD stage IV, last creatinine 2.41.  3.  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  She continues to follow regularly on immunosuppressive therapy.  Medication Adjustments/Labs and Tests Ordered: Current medicines are reviewed at length with the patient today.  Concerns regarding medicines are outlined above.   Tests Ordered: No orders of the defined types were placed in this encounter.   Medication Changes: Meds ordered this encounter  Medications   isosorbide mononitrate (IMDUR) 30 MG 24 hr tablet    Sig: Take 0.5 tablets (15 mg total) by mouth daily for 15 days. For 2 weeks, then stop it    Dispense:  90 tablet    Refill:  0    12/28/2020 change in directions   atenolol (TENORMIN) 25 MG tablet    Sig: Take 0.5 tablets (12.5 mg total) by mouth daily.    Dispense:  45 tablet    Refill:  3     Disposition:  Follow up  3 months.  Signed, Satira Sark, MD, [facility] 12/28/2020 3:35 PM    [facility] [address], [place], [place] [address] Phone: [phone]; Fax: [phone]

## 2021-01-05 DIAGNOSIS — N189 Chronic kidney disease, unspecified: Secondary | ICD-10-CM | POA: Diagnosis not present

## 2021-01-05 DIAGNOSIS — E1122 Type 2 diabetes mellitus with diabetic chronic kidney disease: Secondary | ICD-10-CM | POA: Diagnosis not present

## 2021-01-05 DIAGNOSIS — Z7682 Awaiting organ transplant status: Secondary | ICD-10-CM | POA: Diagnosis not present

## 2021-01-05 DIAGNOSIS — D472 Monoclonal gammopathy: Secondary | ICD-10-CM | POA: Diagnosis not present

## 2021-01-05 DIAGNOSIS — R809 Proteinuria, unspecified: Secondary | ICD-10-CM | POA: Diagnosis not present

## 2021-01-08 DIAGNOSIS — R5383 Other fatigue: Secondary | ICD-10-CM | POA: Diagnosis not present

## 2021-01-08 DIAGNOSIS — E119 Type 2 diabetes mellitus without complications: Secondary | ICD-10-CM | POA: Diagnosis not present

## 2021-01-08 DIAGNOSIS — E441 Mild protein-calorie malnutrition: Secondary | ICD-10-CM | POA: Diagnosis not present

## 2021-01-08 DIAGNOSIS — N184 Chronic kidney disease, stage 4 (severe): Secondary | ICD-10-CM | POA: Diagnosis not present

## 2021-01-11 DIAGNOSIS — Z9482 Intestine transplant status: Secondary | ICD-10-CM | POA: Diagnosis not present

## 2021-01-11 DIAGNOSIS — R5383 Other fatigue: Secondary | ICD-10-CM | POA: Diagnosis not present

## 2021-01-11 DIAGNOSIS — G4709 Other insomnia: Secondary | ICD-10-CM | POA: Diagnosis not present

## 2021-01-12 ENCOUNTER — Ambulatory Visit
Admission: RE | Admit: 2021-01-12 | Discharge: 2021-01-12 | Disposition: A | Discharge status: Home / Self Care | Payer: Medicare Other | Source: Ambulatory Visit | Attending: Internal Medicine | Admitting: Internal Medicine

## 2021-01-12 DIAGNOSIS — Z1231 Encounter for screening mammogram for malignant neoplasm of breast: Secondary | ICD-10-CM

## 2021-01-14 ENCOUNTER — Other Ambulatory Visit: Payer: Self-pay | Admitting: Internal Medicine

## 2021-01-14 DIAGNOSIS — E1122 Type 2 diabetes mellitus with diabetic chronic kidney disease: Secondary | ICD-10-CM | POA: Diagnosis not present

## 2021-01-14 DIAGNOSIS — N189 Chronic kidney disease, unspecified: Secondary | ICD-10-CM | POA: Diagnosis not present

## 2021-01-14 DIAGNOSIS — E211 Secondary hyperparathyroidism, not elsewhere classified: Secondary | ICD-10-CM | POA: Diagnosis not present

## 2021-01-14 DIAGNOSIS — D472 Monoclonal gammopathy: Secondary | ICD-10-CM | POA: Diagnosis not present

## 2021-01-14 DIAGNOSIS — R928 Other abnormal and inconclusive findings on diagnostic imaging of breast: Secondary | ICD-10-CM

## 2021-01-17 ENCOUNTER — Encounter: Payer: Self-pay | Admitting: Emergency Medicine

## 2021-01-17 ENCOUNTER — Other Ambulatory Visit: Payer: Self-pay

## 2021-01-17 ENCOUNTER — Ambulatory Visit
Admission: EM | Admit: 2021-01-17 | Discharge: 2021-01-17 | Disposition: A | Discharge status: Home / Self Care | Payer: Medicare Other | Attending: Urgent Care | Admitting: Urgent Care

## 2021-01-17 DIAGNOSIS — Z944 Liver transplant status: Secondary | ICD-10-CM

## 2021-01-17 DIAGNOSIS — Z9483 Pancreas transplant status: Secondary | ICD-10-CM | POA: Diagnosis not present

## 2021-01-17 DIAGNOSIS — R052 Subacute cough: Secondary | ICD-10-CM | POA: Diagnosis not present

## 2021-01-17 DIAGNOSIS — U071 COVID-19: Secondary | ICD-10-CM | POA: Diagnosis not present

## 2021-01-17 MED ORDER — BENZONATATE 100 MG PO CAPS: 100.0000 mg | ORAL_CAPSULE | Freq: Three times a day (TID) | ORAL | 0 refills | Status: DC | PRN

## 2021-01-17 MED ORDER — MOLNUPIRAVIR EUA 200MG CAPSULE: 4.0000 | ORAL_CAPSULE | Freq: Two times a day (BID) | ORAL | 0 refills | Status: AC

## 2021-01-17 NOTE — ED Provider Notes (Signed)
[place]   MRN: [id number] DOB: 11/24/1960  Subjective:   [name] is a 60 y.o. female presenting for COVID-19 treatment.  Patient had exposure to COVID through her husband who tested positive.  She did a home test and was +2.  She has a history of multiple organ transplant including the liver and pancreas.  Has had body aches, coughing, headache, sinus congestion.  No chest pain, shortness of breath or wheezing.  She has very close follow-up with her transplant team.  No current facility-administered medications for this encounter.  Current Outpatient Medications:    acetaminophen (TYLENOL) 325 MG tablet, Take 2 tablets (650 mg total) by mouth every 4 (four) hours as needed for headache or mild pain., Disp: , Rfl:    aspirin EC 81 MG tablet, Take 1 tablet (81 mg total) by mouth daily. Swallow whole., Disp: 90 tablet, Rfl: 3   atenolol (TENORMIN) 25 MG tablet, Take 0.5 tablets (12.5 mg total) by mouth daily., Disp: 45 tablet, Rfl: 3   calcitRIOL (ROCALTROL) 0.25 MCG capsule, Take 0.25 mcg by mouth every Monday, Wednesday, and Friday., Disp: , Rfl:    clopidogrel (PLAVIX) 75 MG tablet, Take 1 tablet (75 mg total) by mouth daily. (Patient taking differently: Take 75 mg by mouth at bedtime.), Disp: 30 tablet, Rfl: 11   cycloSPORINE (SANDIMMUNE) 100 MG capsule, Take 100 mg by mouth daily., Disp: , Rfl:    cycloSPORINE (SANDIMMUNE) 25 MG capsule, Take 75 mg by mouth at bedtime., Disp: , Rfl:    ezetimibe (ZETIA) 10 MG tablet, Take 1 tablet (10 mg total) by mouth daily., Disp: 30 tablet, Rfl: 6   isosorbide mononitrate (IMDUR) 30 MG 24 hr tablet, Take 0.5 tablets (15 mg total) by mouth daily for 15 days. For 2 weeks, then stop it, Disp: 90 tablet, Rfl: 0   Multiple Vitamins-Minerals (SUPER THERA VITE M PO), Take 1 tablet by mouth daily., Disp: , Rfl:    mycophenolate (CELLCEPT) 250 MG capsule, Take 250-500 mg by mouth See admin instructions. Take 500 mg in the morning.  250 mg at bedtime, Disp: , Rfl:    nitroGLYCERIN (NITROSTAT) 0.4 MG SL tablet, Place 1 tablet (0.4 mg total) under the tongue every 5 (five) minutes as needed., Disp: 25 tablet, Rfl: 3   ondansetron (ZOFRAN) 4 MG tablet, Take 1 tablet (4 mg total) by mouth every 6 (six) hours. As needed for nausea/vomiting (Patient not taking: Reported on 10/28/2020), Disp: 12 tablet, Rfl: 0   Oxycodone HCl 10 MG TABS, Take 10 mg by mouth every 6 (six) hours as needed (pain)., Disp: , Rfl:    pantoprazole (PROTONIX) 40 MG tablet, Take 40 mg by mouth at bedtime., Disp: , Rfl:    pravastatin (PRAVACHOL) 10 MG tablet, Take 1 tablet (10 mg total) by mouth daily at 6 PM., Disp: 30 tablet, Rfl: 6   sodium bicarbonate 650 MG tablet, Take 650 mg by mouth 2 (two) times daily., Disp: , Rfl:    traZODone (DESYREL) 50 MG tablet, Take 100 mg by mouth at bedtime., Disp: , Rfl:    Allergies  Allergen Reactions   Ancef [Cefazolin Sodium] Other (See Comments)    Reaction:  Vaginal and mouth blisters   Tapentadol     Pt unsure of allergy   Gadobenate Itching, Nausea And Vomiting and Rash   Iodinated Diagnostic Agents Itching, Nausea And Vomiting and Rash   Tape Rash    Past Medical History:  Diagnosis Date   Anemia  Anxiety    Ascites    CKD (chronic kidney disease) stage 4, GFR 15-29 ml/min (HCC)    Depression    Enteropathy 07/31/2015   Portal hypertensive enteropathy per capsule study, with stigmata of bleeding.   Esophageal varices (HCC)    Gastric ulcer 07/15/2015   EGD; no stigmata of bleeding   GI bleed 07/17/2015   Gram-negative bacteremia 12/08/2011   Headache(784.0)    Heart murmur    History of cirrhosis    History of hepatitis C - successfuly treated medically 07/14/2015   Nephrolithiasis    Portal vein thrombosis    Retroperitoneal hemorrhage complicating cardiac catheterization, small 11/06/2020   Right ureteral stone 12/07/2011   S/P angioplasty CTO with stent  (DES) 11/04/20 of mid LAD 11/06/2020    Status [facility] liver [facility] ([place]   [facility]   Status post pancreas [facility] ([place]) 08/[facility]   [facility]   Status post small bowel transplant ([place]) 08/[facility]   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   Type 2 diabetes mellitus ([place])      Past Surgical History:  Procedure Laterality Date   ABDOMINAL HYSTERECTOMY  2003   [place]   COLONOSCOPY     COLONOSCOPY  10/21/2011   Procedure: COLONOSCOPY;  Surgeon: Rogene Houston, MD;  [facility];  Service: Endoscopy;  Laterality: N/A;  245    CORONARY CTO INTERVENTION N/A 11/04/2020   Procedure: CORONARY CTO INTERVENTION;  Surgeon: Martinique, Peter M, MD;  Location: [place] CV LAB;  Service: Cardiovascular;  Laterality: N/A;   CYSTOSCOPY W/ RETROGRADES  12/08/2011   Procedure: CYSTOSCOPY WITH RETROGRADE PYELOGRAM;  Surgeon: Marissa Nestle, MD;  Location: [facility];  Service: Urology;  Laterality: Right;   CYSTOSCOPY WITH STENT PLACEMENT  12/08/2011   Procedure: CYSTOSCOPY WITH STENT PLACEMENT;  Surgeon: Marissa Nestle, MD;  Location: [facility];  Service: Urology;  Laterality: Right;   DILATION AND CURETTAGE OF UTERUS     ESOPHAGOGASTRODUODENOSCOPY N/A 02/19/2014   Procedure: ESOPHAGOGASTRODUODENOSCOPY (EGD);  Surgeon: Rogene Houston, MD;  [facility];  Service: Endoscopy;  Laterality: N/A;  100   ESOPHAGOGASTRODUODENOSCOPY N/A 07/15/2015   Procedure: ESOPHAGOGASTRODUODENOSCOPY (EGD);  Surgeon: Rogene Houston, MD;  [facility];  Service: Endoscopy;  Laterality: N/A;   ESOPHAGOGASTRODUODENOSCOPY N/A 07/29/2015   Procedure: ESOPHAGOGASTRODUODENOSCOPY (EGD);  Surgeon: Rogene Houston, MD;  [facility];  Service: Endoscopy;  Laterality: N/A;   GASTRIC BYPASS  ~ [place]  04/14/11   "one in my bellybutton" (11/07/2012)   INGUINAL HERNIA REPAIR Right    "maybe 2" (11/07/2012)   KNEE ARTHROSCOPY Right    LEFT HEART CATH AND CORONARY  ANGIOGRAPHY N/A 09/25/2020   Procedure: LEFT HEART CATH AND CORONARY ANGIOGRAPHY;  Surgeon: Sherren Mocha, MD;  Location: [place] CV LAB;  Service: Cardiovascular;  Laterality: N/A;   LITHOTRIPSY     "several times" (11/07/2012)   OPEN REDUCTION INTERNAL FIXATION (ORIF) DISTAL RADIAL FRACTURE Right 11/07/2012   Procedure: OPEN REDUCTION INTERNAL FIXATION (ORIF) RIGHT DISTAL RADIAL FRACTURE;  Surgeon: [name], MD;  Location: [place];  Service: Orthopedics;  Laterality: Right;   ORIF DISTAL RADIUS FRACTURE Right 11/07/2012   OTHER SURGICAL HISTORY  08/14/2016   5 organ transplant- liver, stomach, pancreas, small and large intestine   UPPER GASTROINTESTINAL ENDOSCOPY      Family History  Problem Relation Age of Onset  Dementia Mother    Heart disease Father    Liver disease Father    Hypertension Father    Diabetes Father    Dementia Father    Hypertension Brother     Social History   Tobacco Use   Smoking status: Former    Packs/day: 0.50    Years: 5.00    Pack years: 2.50    Types: Cigarettes    Quit date: 05/02/1991    Years since quitting: 29.7   Smokeless tobacco: Never  Vaping Use   Vaping Use: Never used  Substance Use Topics   Alcohol use: No    Alcohol/week: 0.0 standard drinks   Drug use: No    ROS   Objective:   Vitals: BP (!) 159/88 (BP Location: Right Arm)   Pulse 69   Temp 98.5 F (36.9 C) (Oral)   Resp 18   SpO2 95%   Physical Exam Constitutional:      General: She is not in acute distress.    Appearance: Normal appearance. She is well-developed. She is not ill-appearing, toxic-appearing or diaphoretic.  HENT:     Head: Normocephalic and atraumatic.     Nose: Nose normal.     Mouth/Throat:     Mouth: Mucous membranes are moist.  Eyes:     Extraocular Movements: Extraocular movements intact.     Pupils: Pupils are equal, round, and reactive to light.  Cardiovascular:     Rate and Rhythm: Normal rate and regular rhythm.     Pulses:  Normal pulses.     Heart sounds: Normal heart sounds. No murmur heard.   No friction rub. No gallop.  Pulmonary:     Effort: Pulmonary effort is normal. No respiratory distress.     Breath sounds: Normal breath sounds. No stridor. No wheezing, rhonchi or rales.  Skin:    General: Skin is warm and dry.     Findings: No rash.  Neurological:     Mental Status: She is alert and oriented to person, place, and time.  Psychiatric:        Mood and Affect: Mood normal.        Behavior: Behavior normal.        Thought Content: Thought content normal.    Assessment and Plan :   PDMP not reviewed this encounter.  1. Clinical diagnosis of COVID-19   2. Subacute cough   3. History of liver transplant ([place])   4. History of pancreas transplant [facility])    Drug interactions reviewed.  She is most medically appropriate to take molnupiravir for COVID-19.  Use supportive care otherwise. Deferred imaging given clear cardiopulmonary exam, hemodynamically stable vital signs.  Emphasized the need for very close follow-up with her transplant team. Counseled patient on potential for adverse effects with medications prescribed/recommended today, ER and return-to-clinic precautions discussed, patient verbalized understanding.    Jaynee Eagles, PA-C 01/17/21 1540

## 2021-01-17 NOTE — ED Triage Notes (Signed)
Headache, body aches, sore throat, nasal congestion, nausea, since yesterday.  Home covid test positive.

## 2021-01-18 LAB — NOVEL CORONAVIRUS, NAA: SARS-CoV-2, NAA: DETECTED — AB

## 2021-01-20 DIAGNOSIS — I10 Essential (primary) hypertension: Secondary | ICD-10-CM | POA: Diagnosis not present

## 2021-01-20 DIAGNOSIS — G4733 Obstructive sleep apnea (adult) (pediatric): Secondary | ICD-10-CM | POA: Diagnosis not present

## 2021-01-27 DIAGNOSIS — Z6834 Body mass index (BMI) 34.0-34.9, adult: Secondary | ICD-10-CM | POA: Diagnosis not present

## 2021-01-27 DIAGNOSIS — R928 Other abnormal and inconclusive findings on diagnostic imaging of breast: Secondary | ICD-10-CM | POA: Diagnosis not present

## 2021-01-27 DIAGNOSIS — J329 Chronic sinusitis, unspecified: Secondary | ICD-10-CM | POA: Diagnosis not present

## 2021-01-27 DIAGNOSIS — U071 COVID-19: Secondary | ICD-10-CM | POA: Diagnosis not present

## 2021-02-19 ENCOUNTER — Other Ambulatory Visit: Payer: Self-pay | Admitting: Internal Medicine

## 2021-02-19 ENCOUNTER — Ambulatory Visit
Admission: RE | Admit: 2021-02-19 | Discharge: 2021-02-19 | Disposition: A | Discharge status: Home / Self Care | Payer: Medicare Other | Source: Ambulatory Visit | Attending: Internal Medicine | Admitting: Internal Medicine

## 2021-02-19 ENCOUNTER — Other Ambulatory Visit: Payer: Self-pay

## 2021-02-19 DIAGNOSIS — R928 Other abnormal and inconclusive findings on diagnostic imaging of breast: Secondary | ICD-10-CM

## 2021-02-19 DIAGNOSIS — N6489 Other specified disorders of breast: Secondary | ICD-10-CM | POA: Diagnosis not present

## 2021-03-08 ENCOUNTER — Other Ambulatory Visit (HOSPITAL_COMMUNITY): Payer: Self-pay | Admitting: Internal Medicine

## 2021-03-17 DIAGNOSIS — Z9484 Stem cells transplant status: Secondary | ICD-10-CM | POA: Diagnosis not present

## 2021-03-17 DIAGNOSIS — D849 Immunodeficiency, unspecified: Secondary | ICD-10-CM | POA: Diagnosis not present

## 2021-03-22 DIAGNOSIS — F331 Major depressive disorder, recurrent, moderate: Secondary | ICD-10-CM | POA: Diagnosis not present

## 2021-03-22 DIAGNOSIS — G4709 Other insomnia: Secondary | ICD-10-CM | POA: Diagnosis not present

## 2021-03-22 DIAGNOSIS — R5383 Other fatigue: Secondary | ICD-10-CM | POA: Diagnosis not present

## 2021-03-23 DIAGNOSIS — I10 Essential (primary) hypertension: Secondary | ICD-10-CM | POA: Diagnosis not present

## 2021-03-23 DIAGNOSIS — G4733 Obstructive sleep apnea (adult) (pediatric): Secondary | ICD-10-CM | POA: Diagnosis not present

## 2021-03-30 DIAGNOSIS — N184 Chronic kidney disease, stage 4 (severe): Secondary | ICD-10-CM | POA: Diagnosis not present

## 2021-03-30 DIAGNOSIS — E6609 Other obesity due to excess calories: Secondary | ICD-10-CM | POA: Diagnosis not present

## 2021-03-30 DIAGNOSIS — J329 Chronic sinusitis, unspecified: Secondary | ICD-10-CM | POA: Diagnosis not present

## 2021-03-30 DIAGNOSIS — G894 Chronic pain syndrome: Secondary | ICD-10-CM | POA: Diagnosis not present

## 2021-04-06 ENCOUNTER — Encounter: Payer: Self-pay | Admitting: Cardiology

## 2021-04-06 ENCOUNTER — Ambulatory Visit: Payer: Medicare Other | Admitting: Cardiology

## 2021-04-06 VITALS — BP 138/82 | HR 66 | Ht 66.0 in | Wt 215.2 lb

## 2021-04-06 DIAGNOSIS — N184 Chronic kidney disease, stage 4 (severe): Secondary | ICD-10-CM

## 2021-04-06 DIAGNOSIS — I25119 Atherosclerotic heart disease of native coronary artery with unspecified angina pectoris: Secondary | ICD-10-CM

## 2021-04-06 DIAGNOSIS — E782 Mixed hyperlipidemia: Secondary | ICD-10-CM | POA: Diagnosis not present

## 2021-04-06 NOTE — Patient Instructions (Signed)
Medication Instructions:   Your physician recommends that you continue on your current medications as directed. Please refer to the Current Medication list given to you today.  Labwork:  none  Testing/Procedures:  none  Follow-Up:  Your physician recommends that you schedule a follow-up appointment in: 3 months.  Any Other Special Instructions Will Be Listed Below (If Applicable).  If you need a refill on your cardiac medications before your next appointment, please call your pharmacy. 

## 2021-04-06 NOTE — Progress Notes (Signed)
Cardiology Office Note  Date: 04/06/2021   ID: [name], DOB September 17, 1960, MRN [id number]  PCP:  Redmond School, MD  Cardiologist:  Rozann Lesches, MD Electrophysiologist:  None   Chief Complaint  Patient presents with   Cardiac follow-up    History of Present Illness: [name] is a 62 y.o. female last seen in November 2022.  She is here for a routine visit.  Reports no active angina at this time and has continued on stable cardiac regimen.  Has had some bruising on aspirin and Plavix, no major bleeding however.  Intervention report indicated at least 6 months of dual antiplatelet therapy.  She has not had to use any nitroglycerin.  She continues to follow at [facility], I reviewed her most recent lab work as noted below.  Remains functional with ADLs, NYHA class II dyspnea.  Past Medical History:  Diagnosis Date   Anemia    Anxiety    Ascites    CKD (chronic kidney disease) stage 4, GFR 15-29 ml/min (HCC)    Depression    Enteropathy 07/31/2015   Portal hypertensive enteropathy per capsule study, with stigmata of bleeding.   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   [facility]   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   [facility]   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   Type 2 diabetes mellitus ([place])     Past Surgical History:  Procedure Laterality Date   ABDOMINAL HYSTERECTOMY  2003    [place]   COLONOSCOPY     COLONOSCOPY  10/21/2011   Procedure: COLONOSCOPY;  Surgeon: Rogene Houston, MD;  [facility];  Service: Endoscopy;  Laterality: N/A;  245    CORONARY CTO INTERVENTION N/A 11/04/2020   Procedure: CORONARY CTO INTERVENTION;  Surgeon: Martinique, Peter M, MD;  Location: [place] [facility];  Service: Cardiovascular;  Laterality: N/A;   CYSTOSCOPY W/ RETROGRADES  12/08/2011   Procedure: CYSTOSCOPY WITH RETROGRADE PYELOGRAM;  Surgeon: Marissa Nestle, MD;  Location: [facility];  Service: Urology;  Laterality: Right;   CYSTOSCOPY WITH STENT PLACEMENT  12/08/2011   Procedure: CYSTOSCOPY WITH STENT PLACEMENT;  Surgeon: Marissa Nestle, MD;  Location: [facility];  Service: Urology;  Laterality: Right;   DILATION AND CURETTAGE OF UTERUS     ESOPHAGOGASTRODUODENOSCOPY N/A 02/19/2014   Procedure: ESOPHAGOGASTRODUODENOSCOPY (EGD);  Surgeon: Rogene Houston, MD;  [facility];  Service: Endoscopy;  Laterality: N/A;  100   ESOPHAGOGASTRODUODENOSCOPY N/A 07/15/2015   Procedure: ESOPHAGOGASTRODUODENOSCOPY (EGD);  Surgeon: Rogene Houston, MD;  [facility];  Service: Endoscopy;  Laterality: N/A;   ESOPHAGOGASTRODUODENOSCOPY N/A 07/29/2015   Procedure: ESOPHAGOGASTRODUODENOSCOPY (EGD);  Surgeon: Rogene Houston, MD;  [facility];  Service: Endoscopy;  Laterality: N/A;   GASTRIC BYPASS  ~ [place]  04/14/11   "one in my bellybutton" (11/07/2012)   INGUINAL HERNIA REPAIR Right    "maybe 2" (11/07/2012)   KNEE ARTHROSCOPY Right    LEFT HEART CATH AND CORONARY ANGIOGRAPHY N/A 09/25/2020   Procedure: LEFT HEART CATH AND CORONARY ANGIOGRAPHY;  Surgeon: Sherren Mocha, MD;  Location: [place] [facility];  Service: Cardiovascular;  Laterality: N/A;   LITHOTRIPSY     "several times" (11/07/2012)   OPEN REDUCTION INTERNAL FIXATION (ORIF) DISTAL RADIAL FRACTURE Right 11/07/2012   Procedure: OPEN REDUCTION INTERNAL  FIXATION (ORIF) RIGHT DISTAL RADIAL FRACTURE;  Surgeon: [name], MD;  Location: [facility];  Service: Orthopedics;  Laterality: Right;   ORIF DISTAL RADIUS FRACTURE Right 11/07/2012   OTHER SURGICAL HISTORY  08/14/2016   5 organ transplant- liver, stomach, pancreas, small and large intestine   UPPER GASTROINTESTINAL ENDOSCOPY      Current Outpatient Medications  Medication Sig Dispense Refill   acetaminophen (TYLENOL) 325 MG tablet Take 2 tablets (650 mg total) by mouth every 4 (four) hours as needed for headache or mild pain.     aspirin EC 81 MG tablet Take 1 tablet (81 mg total) by mouth daily. Swallow whole. 90 tablet 3   atenolol (TENORMIN) 25 MG tablet Take 0.5 tablets (12.5 mg total) by mouth daily. 45 tablet 3   calcitRIOL (ROCALTROL) 0.25 MCG capsule Take 0.25 mcg by mouth every Monday, Wednesday, and Friday.     clopidogrel (PLAVIX) 75 MG tablet Take 1 tablet (75 mg total) by mouth daily. (Patient taking differently: Take 75 mg by mouth at bedtime.) 30 tablet 11   cycloSPORINE (SANDIMMUNE) 100 MG capsule Take 100 mg by mouth daily.     cycloSPORINE (SANDIMMUNE) 25 MG capsule Take 75 mg by mouth at bedtime.     ezetimibe (ZETIA) 10 MG tablet Take 1 tablet (10 mg total) by mouth daily. 30 tablet 6   Multiple Vitamins-Minerals (SUPER THERA VITE M PO) Take 1 tablet by mouth daily.     mycophenolate (CELLCEPT) 250 MG capsule Take 250-500 mg by mouth See admin instructions. Take 500 mg in the morning. 250 mg at bedtime     nitroGLYCERIN (NITROSTAT) 0.4 MG SL tablet Place 1 tablet (0.4 mg total) under the tongue every 5 (five) minutes as needed. 25 tablet 3   Oxycodone HCl 10 MG TABS Take 10 mg by mouth every 6 (six) hours as needed (pain).     pantoprazole (PROTONIX) 40 MG tablet Take 40 mg by mouth at bedtime.     pravastatin (PRAVACHOL) 10 MG tablet Take 1 tablet (10 mg total) by mouth daily at 6 PM. 30 tablet 6   sodium bicarbonate 650 MG tablet Take 650 mg by mouth 2 (two) times  daily.     traZODone (DESYREL) 50 MG tablet Take 100 mg by mouth at bedtime.     No current facility-administered medications for this visit.   Allergies:  Ancef [cefazolin sodium], Tapentadol, Gadobenate, Iodinated contrast media, and Tape   ROS: Palpitations or syncope.  Physical Exam: VS:  BP 138/82    Pulse 66    Ht 5\' 6"  (1.676 m)    Wt 215 lb 3.2 oz (97.6 kg)    SpO2 97%    BMI 34.73 kg/m , BMI Body mass index is 34.73 kg/m.  Wt Readings from Last 3 Encounters:  04/06/21 215 lb 3.2 oz (97.6 kg)  12/28/20 220 lb 12.8 oz (100.2 kg)  11/16/20 229 lb (103.9 kg)    General: Patient  appears comfortable at rest. HEENT: Conjunctiva and lids normal, wearing a mask. Neck: Supple, no elevated JVP or carotid bruits, no thyromegaly. Lungs: Clear to auscultation, nonlabored breathing at rest. Cardiac: Regular rate and rhythm, no S3 or significant systolic murmur, no pericardial rub. Extremities: No pitting edema.  ECG:  An ECG dated 11/04/2020 was personally reviewed today and demonstrated:  Sinus bradycardia with lead motion artifact.  Recent Labwork: 11/06/2020: BUN 33; Creatinine, Ser 2.41; Hemoglobin 8.5; Platelets 220; Potassium 5.5; Sodium 137 11/26/2020: ALT 9; AST 11     Component Value Date/Time   CHOL 135 11/26/2020 1007   TRIG 85 11/26/2020 1007   HDL 43 11/26/2020 1007   CHOLHDL 3.1 11/26/2020 1007   LDLCALC 76 11/26/2020 1007  February 2023: Hemoglobin 10.7 up from 9.4, platelets 293, potassium 5.1, BUN 22, creatinine 2.01 down from 2.19, AST 17, ALT 15  Other Studies Reviewed Today:  Echocardiogram 05/14/2020:  1. Left ventricular ejection fraction, by estimation, is 60 to 65%. The  left ventricle has normal function. The left ventricle has no regional  wall motion abnormalities. There is mild left ventricular hypertrophy.  Left ventricular diastolic parameters  were normal. Normal global longitudinal strain of -19.1%.   2. Right ventricular systolic function is  normal. The right ventricular  size is normal. There is normal pulmonary artery systolic pressure. The  estimated right ventricular systolic pressure is 61.9 mmHg.   3. Left atrial size was mildly dilated.   4. The mitral valve is grossly normal. Mild mitral valve regurgitation.   5. The aortic valve is tricuspid. Aortic valve regurgitation is not  visualized.   6. The inferior vena cava is normal in size with greater than 50%  respiratory variability, suggesting right atrial pressure of 3 mmHg.    CTO PCI 11/04/2020:   Mid LAD lesion is 100% stenosed.   A drug-eluting stent was successfully placed using a SYNERGY XD 2.50X32.   Post intervention, there is a 0% residual stenosis.   Successful CTO PCI of the mid LAD with IVUS guidance and DES x 1  Assessment and Plan:  1.  CAD status post CTO PCI with DES to the LAD in September 2022.  She did have a small retroperitoneal bleed with procedure but has done well since that time.  No active angina.  She is tolerating current regimen.  Continue aspirin and Plavix for at least 6 months, will reassess at next visit.  She remains on atenolol, Pravachol, and Zetia as well.  2.  CKD stage IV, recent creatinine was down to 2.01 with high normal potassium.  3. History of multivisceral transplant including liver, pancreas, and small bowel in July 2018 at [facility].  She continues to follow regularly on immunosuppressive therapy.  Medication Adjustments/Labs and Tests Ordered: Current medicines are reviewed at length with the patient today.  Concerns regarding medicines are outlined above.   Tests Ordered: No orders of the defined types were placed in this encounter.   Medication Changes: No orders of the defined types were placed in this encounter.   Disposition:  Follow up  3 months.  Signed, Satira Sark, MD, [facility] 04/06/2021 2:29 PM    [place] [facility] [place], [place], [place] [address] Phone: [phone]; Fax: [phone]

## 2021-04-12 ENCOUNTER — Other Ambulatory Visit (HOSPITAL_COMMUNITY): Payer: Self-pay | Admitting: Internal Medicine

## 2021-04-12 ENCOUNTER — Other Ambulatory Visit: Payer: Self-pay | Admitting: Internal Medicine

## 2021-04-12 DIAGNOSIS — R519 Headache, unspecified: Secondary | ICD-10-CM

## 2021-04-13 ENCOUNTER — Other Ambulatory Visit: Payer: Self-pay | Admitting: Cardiology

## 2021-04-24 DIAGNOSIS — H524 Presbyopia: Secondary | ICD-10-CM | POA: Diagnosis not present

## 2021-05-07 DIAGNOSIS — K219 Gastro-esophageal reflux disease without esophagitis: Secondary | ICD-10-CM | POA: Diagnosis not present

## 2021-05-07 DIAGNOSIS — I10 Essential (primary) hypertension: Secondary | ICD-10-CM | POA: Diagnosis not present

## 2021-05-07 DIAGNOSIS — N184 Chronic kidney disease, stage 4 (severe): Secondary | ICD-10-CM | POA: Diagnosis not present

## 2021-05-12 ENCOUNTER — Ambulatory Visit (HOSPITAL_COMMUNITY)
Admission: RE | Admit: 2021-05-12 | Discharge: 2021-05-12 | Disposition: A | Discharge status: Home / Self Care | Payer: Medicare Other | Source: Ambulatory Visit | Attending: Internal Medicine | Admitting: Internal Medicine

## 2021-05-12 DIAGNOSIS — R519 Headache, unspecified: Secondary | ICD-10-CM | POA: Insufficient documentation

## 2021-05-17 DIAGNOSIS — Z9482 Intestine transplant status: Secondary | ICD-10-CM | POA: Diagnosis not present

## 2021-05-17 DIAGNOSIS — F331 Major depressive disorder, recurrent, moderate: Secondary | ICD-10-CM | POA: Diagnosis not present

## 2021-05-17 DIAGNOSIS — F419 Anxiety disorder, unspecified: Secondary | ICD-10-CM | POA: Diagnosis not present

## 2021-05-17 DIAGNOSIS — R5383 Other fatigue: Secondary | ICD-10-CM | POA: Diagnosis not present

## 2021-05-17 DIAGNOSIS — G4709 Other insomnia: Secondary | ICD-10-CM | POA: Diagnosis not present

## 2021-05-20 DIAGNOSIS — Z9482 Intestine transplant status: Secondary | ICD-10-CM | POA: Diagnosis not present

## 2021-05-20 DIAGNOSIS — D849 Immunodeficiency, unspecified: Secondary | ICD-10-CM | POA: Diagnosis not present

## 2021-06-01 DIAGNOSIS — Z0001 Encounter for general adult medical examination with abnormal findings: Secondary | ICD-10-CM | POA: Diagnosis not present

## 2021-06-01 DIAGNOSIS — G932 Benign intracranial hypertension: Secondary | ICD-10-CM | POA: Diagnosis not present

## 2021-06-01 DIAGNOSIS — Z1331 Encounter for screening for depression: Secondary | ICD-10-CM | POA: Diagnosis not present

## 2021-06-01 DIAGNOSIS — E119 Type 2 diabetes mellitus without complications: Secondary | ICD-10-CM | POA: Diagnosis not present

## 2021-06-01 DIAGNOSIS — Z6835 Body mass index (BMI) 35.0-35.9, adult: Secondary | ICD-10-CM | POA: Diagnosis not present

## 2021-06-16 ENCOUNTER — Telehealth: Payer: Self-pay | Admitting: Neurology

## 2021-06-16 ENCOUNTER — Encounter: Payer: Self-pay | Admitting: Neurology

## 2021-06-16 ENCOUNTER — Ambulatory Visit: Payer: Medicare Other | Admitting: Neurology

## 2021-06-16 VITALS — BP 149/95 | HR 73 | Ht 66.0 in | Wt 215.5 lb

## 2021-06-16 DIAGNOSIS — G8929 Other chronic pain: Secondary | ICD-10-CM

## 2021-06-16 DIAGNOSIS — Z48298 Encounter for aftercare following other organ transplant: Secondary | ICD-10-CM | POA: Diagnosis not present

## 2021-06-16 DIAGNOSIS — D849 Immunodeficiency, unspecified: Secondary | ICD-10-CM

## 2021-06-16 DIAGNOSIS — R519 Headache, unspecified: Secondary | ICD-10-CM | POA: Diagnosis not present

## 2021-06-16 DIAGNOSIS — U099 Post covid-19 condition, unspecified: Secondary | ICD-10-CM | POA: Diagnosis not present

## 2021-06-16 MED ORDER — MODAFINIL 100 MG PO TABS: 100.0000 mg | ORAL_TABLET | Freq: Every day | ORAL | 5 refills | Status: DC

## 2021-06-16 MED ORDER — ALPRAZOLAM 1 MG PO TABS: 1.0000 mg | ORAL_TABLET | Freq: Every evening | ORAL | 0 refills | Status: DC | PRN

## 2021-06-16 NOTE — Telephone Encounter (Signed)
BCBS medicare pending uploaded notes on the portal ?

## 2021-06-16 NOTE — Telephone Encounter (Signed)
Messaged Cathy with [facility] to get pt scheduled for an [employer] from [place] to schedule patient for a DG FL GUIDED LUMBAR PUNCTURE. ?

## 2021-06-16 NOTE — Progress Notes (Signed)
? ? ? ?Provider:  Larey Seat, M D  ?Referring Provider: Redmond School, MD ?Primary Care Physician:  Redmond School, MD ? ?Chief Complaint  ?Patient presents with  ? New Patient (Initial Visit)  ?  Rm 10, alone. Pt referred by Dr. Gerarda Fraction for headaches since Dec 2022. Pt had Covid and ever since then she has daily HA. General throbbing HA. Laying down in a dark room makes it better. Has tried tylenol w no relief. Believed to have thought it was due to vision change. Recently seen by her eye doctor and was prescribed new glasses and still no change in HA.   ? ? ?HPI:  [name] is a 61 y.o. female  and was seen in 2015 for a sleep consultation, now referred for a headache evaluation from Dr. Gerarda Fraction - she cought Covid 19 in 01-2021 and has had headaches ever since. ?Most important is her interval history : Since I had originally seen Marcie Bal C. Byron in 2015 a lot of things have happened.  She contracted bacterial peritonitis 4 times, she developed multiorgan failure due to blood clots and major veins and arteries and ended up receiving 5 organs.  She states that she was transplanted on the day of her birthday in July 2017.  She was the first person ever to receive a 5 organ transplant at [facility]. 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  So I am not surprised that the symptoms of COVID lasted and lingered longer. She was vaccinated.  ? The headache is described as a general throbbing it is not related to a single trigger point of origin.  She recently received new glasses and still have seen no change in headaches.  Over-the-counter medications such as Tylenol have not given her relief.  No associated vision changes. These headaches can be present in the morning or wake her out of sleep at night.  Nocturnal headaches feel as if " the top of my head is coming off" pounding and pulsating. ?Dr. Gerarda Fraction ordered a head CT:  since the patient is in stage IV ESRD and did not receive  contrast. CT was unremarkable.  ?No brain MRI has yet been obtained.  ?She also was found to have progressed CAD in September 2023 and LAD- stent.   ? ?Headaches hurt most on the top of the head, front and nape of the neck- pressure?  ?Headaches come with nausea and balance changes, she has fallen.  ?No photophobia, phonophobia. Sometimes eye pain, but not often.  ? ? ?   ? ?Review of Systems: ?Out of a complete 14 system review, the patient complains of only the following symptoms, and all other reviewed systems are negative. ?See above.  ? ?Sleep related headaches but not  migrainous. No aura, No vomiting, but nauseated  ? ?Social History  ? ?Socioeconomic History  ? Marital status: Married  ?  Spouse name: Doran Stabler  ? Number of children: 1 son, 46  ? Years of education: 6  ? Highest education level: Associate degree: occupational, Hotel manager, or vocational program  ?Occupational History  ? Not on file   worked for her brother [employer] in [employer], [employer].  ?Tobacco Use  ? Smoking status: Former  ?  Packs/day: 0.50  ?  Years: 5.00  ?  Pack years: 2.50  ?  Types: Cigarettes  ?  Quit date: 05/02/1991  ?  Years since quitting: 30.1  ? Smokeless tobacco: Never  ?Vaping Use  ?  Vaping Use: Never used  ?Substance and Sexual Activity  ? Alcohol use: No  ?  Alcohol/week: 0.0 standard drinks  ? Drug use: No  ? Sexual activity: Not on file  ?Other Topics Concern  ? Not on file  ?Social History Narrative  ? Lives with husband and son  ? L handed  ? Caffeine: 8 soda cans a week   ? ?Social Determinants of Health  ? ?Financial Resource Strain: Not on file  ?Food Insecurity: Not on file  ?Transportation Needs: Not on file  ?Physical Activity: Not on file  ?Stress: Not on file  ?Social Connections: Not on file  ?Intimate Partner Violence: Not on file  ? ? ?Family History  ?Problem Relation Age of Onset  ? Dementia Mother   ? Heart disease Father   ? Liver disease Father   ? Hypertension Father   ? Diabetes Father   ? Dementia  Father   ? Hypertension Brother   ? ? ?Past Medical History:  ?Diagnosis Date  ? Anemia   ? Anxiety   ? Ascites   ? CKD (chronic kidney disease) stage 4, GFR 15-29 ml/min (HCC)   ? Depression   ? Enteropathy 07/31/2015  ? Portal hypertensive enteropathy per capsule study, with stigmata of bleeding.  ? Esophageal varices (HCC)   ? Gastric ulcer 07/15/2015  ? EGD; no stigmata of bleeding  ? GI bleed 07/17/2015  ? Gram-negative bacteremia 12/08/2011  ? Headache(784.0)   ? Heart murmur   ? History of cirrhosis   ? History of hepatitis C - successfuly treated medically 07/14/2015  ? Nephrolithiasis   ? Portal vein thrombosis   ? Retroperitoneal hemorrhage complicating cardiac catheterization, small 11/06/2020  ? Right ureteral stone 12/07/2011  ? S/P angioplasty CTO with stent  (DES) 11/04/20 of mid LAD 11/06/2020  ? 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 [facility]  ? 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 [facility]  ? 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 [facility]  ? 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 Type 2 diabetes mellitus ([place])   ? ? ?Past Surgical History:  ?Procedure Laterality Date  ? ABDOMINAL HYSTERECTOMY  2003  ? [place]  ? CHOLECYSTECTOMY  1987  ? COLONOSCOPY    ? COLONOSCOPY  10/21/2011  ? Procedure: COLONOSCOPY;  Surgeon: Rogene Houston, MD;  [facility];  Service: Endoscopy;  Laterality: N/A;  245   ? CORONARY CTO INTERVENTION N/A 11/04/2020  ? Procedure: CORONARY CTO INTERVENTION;  Surgeon: Martinique, Peter M, MD;  Location: [place] [facility];  Service: Cardiovascular;  Laterality: N/A;  ? CYSTOSCOPY W/ RETROGRADES  12/08/2011  ? Procedure: CYSTOSCOPY WITH RETROGRADE PYELOGRAM;  Surgeon: Marissa Nestle, MD;  Location: [facility];  Service: Urology;  Laterality: Right;  ? CYSTOSCOPY WITH STENT PLACEMENT  12/08/2011  ? Procedure: CYSTOSCOPY WITH STENT PLACEMENT;  Surgeon: Marissa Nestle, MD;  Location: [facility];  Service: Urology;  Laterality: Right;   ? DILATION AND CURETTAGE OF UTERUS    ? ESOPHAGOGASTRODUODENOSCOPY N/A 02/19/2014  ? Procedure: ESOPHAGOGASTRODUODENOSCOPY (EGD);  Surgeon: Rogene Houston, MD;  [facility];  Service: Endoscopy;  Laterality: N/A;  100  ? ESOPHAGOGASTRODUODENOSCOPY N/A 07/15/2015  ? Procedure: ESOPHAGOGASTRODUODENOSCOPY (EGD);  Surgeon: Rogene Houston, MD;  [facility];  Service: Endoscopy;  Laterality: N/A;  ? ESOPHAGOGASTRODUODENOSCOPY N/A 07/29/2015  ? Procedure: ESOPHAGOGASTRODUODENOSCOPY (EGD);  Surgeon: Rogene Houston, MD;  [facility];  Service: Endoscopy;  Laterality: N/A;  ? GASTRIC BYPASS  ~ 1995  ? HERNIA REPAIR  04/14/11  ? "one in my bellybutton" (11/07/2012)  ? INGUINAL HERNIA REPAIR Right   ? "maybe 2" (11/07/2012)  ? KNEE ARTHROSCOPY Right   ? LEFT HEART CATH AND CORONARY ANGIOGRAPHY N/A 09/25/2020  ? Procedure: LEFT HEART CATH AND CORONARY ANGIOGRAPHY;  Surgeon: Sherren Mocha, MD;  Location: [facility];  Service: Cardiovascular;  Laterality: N/A;  ? LITHOTRIPSY    ? "several times" (11/07/2012)  ? OPEN REDUCTION INTERNAL FIXATION (ORIF) DISTAL RADIAL FRACTURE Right 11/07/2012  ? Procedure: OPEN REDUCTION INTERNAL FIXATION (ORIF) RIGHT DISTAL RADIAL FRACTURE;  Surgeon: [name], MD;  Location: [facility];  Service: Orthopedics;  Laterality: Right;  ? ORIF DISTAL RADIUS FRACTURE Right 11/07/2012  ? OTHER SURGICAL HISTORY  08/14/2016  ? 5 organ transplant- liver, stomach, pancreas, small and large intestine  ? UPPER GASTROINTESTINAL ENDOSCOPY    ? ? ?Current Outpatient Medications  ?Medication Sig Dispense Refill  ? acetaminophen (TYLENOL) 325 MG tablet Take 2 tablets (650 mg total) by mouth every 4 (four) hours as needed for headache or mild pain.    ? aspirin EC 81 MG tablet Take 1 tablet (81 mg total) by mouth daily. Swallow whole. 90 tablet 3  ? atenolol (TENORMIN) 25 MG tablet Take 0.5 tablets (12.5 mg total) by mouth daily. 45 tablet 3  ? buPROPion ER (WELLBUTRIN SR) 100 MG  12 hr tablet Take 100 mg by mouth 2 (two) times daily.    ? calcitRIOL (ROCALTROL) 0.25 MCG capsule Take 0.25 mcg by mouth every Monday, Wednesday, and Friday.    ? clopidogrel (PLAVIX) 75 MG table

## 2021-06-21 NOTE — Telephone Encounter (Signed)
BCBS medicare Josem Kaufmann: [phone] (exp. 06/16/21 to 07/15/21) order sent to GI, they will reach out to the patient to schedule.  ?

## 2021-06-23 ENCOUNTER — Encounter: Payer: Self-pay | Admitting: *Deleted

## 2021-06-29 DIAGNOSIS — D849 Immunodeficiency, unspecified: Secondary | ICD-10-CM | POA: Diagnosis not present

## 2021-06-29 DIAGNOSIS — Z9482 Intestine transplant status: Secondary | ICD-10-CM | POA: Diagnosis not present

## 2021-07-01 DIAGNOSIS — L570 Actinic keratosis: Secondary | ICD-10-CM | POA: Diagnosis not present

## 2021-07-01 DIAGNOSIS — D84821 Immunodeficiency due to drugs: Secondary | ICD-10-CM | POA: Diagnosis not present

## 2021-07-01 DIAGNOSIS — C499 Malignant neoplasm of connective and soft tissue, unspecified: Secondary | ICD-10-CM | POA: Diagnosis not present

## 2021-07-01 DIAGNOSIS — F32A Depression, unspecified: Secondary | ICD-10-CM | POA: Diagnosis not present

## 2021-07-01 DIAGNOSIS — Z9482 Intestine transplant status: Secondary | ICD-10-CM | POA: Diagnosis not present

## 2021-07-01 DIAGNOSIS — D638 Anemia in other chronic diseases classified elsewhere: Secondary | ICD-10-CM | POA: Diagnosis not present

## 2021-07-01 DIAGNOSIS — Z4823 Encounter for aftercare following liver transplant: Secondary | ICD-10-CM | POA: Diagnosis not present

## 2021-07-01 DIAGNOSIS — N189 Chronic kidney disease, unspecified: Secondary | ICD-10-CM | POA: Diagnosis not present

## 2021-07-01 DIAGNOSIS — L814 Other melanin hyperpigmentation: Secondary | ICD-10-CM | POA: Diagnosis not present

## 2021-07-01 DIAGNOSIS — D225 Melanocytic nevi of trunk: Secondary | ICD-10-CM | POA: Diagnosis not present

## 2021-07-01 DIAGNOSIS — D849 Immunodeficiency, unspecified: Secondary | ICD-10-CM | POA: Diagnosis not present

## 2021-07-01 DIAGNOSIS — L821 Other seborrheic keratosis: Secondary | ICD-10-CM | POA: Diagnosis not present

## 2021-07-01 DIAGNOSIS — D485 Neoplasm of uncertain behavior of skin: Secondary | ICD-10-CM | POA: Diagnosis not present

## 2021-07-01 DIAGNOSIS — I2510 Atherosclerotic heart disease of native coronary artery without angina pectoris: Secondary | ICD-10-CM | POA: Diagnosis not present

## 2021-07-01 DIAGNOSIS — L578 Other skin changes due to chronic exposure to nonionizing radiation: Secondary | ICD-10-CM | POA: Diagnosis not present

## 2021-07-01 DIAGNOSIS — B079 Viral wart, unspecified: Secondary | ICD-10-CM | POA: Diagnosis not present

## 2021-07-01 DIAGNOSIS — D2270 Melanocytic nevi of unspecified lower limb, including hip: Secondary | ICD-10-CM | POA: Diagnosis not present

## 2021-07-01 DIAGNOSIS — Z944 Liver transplant status: Secondary | ICD-10-CM | POA: Diagnosis not present

## 2021-07-01 DIAGNOSIS — L910 Hypertrophic scar: Secondary | ICD-10-CM | POA: Diagnosis not present

## 2021-07-01 DIAGNOSIS — Z7682 Awaiting organ transplant status: Secondary | ICD-10-CM | POA: Diagnosis not present

## 2021-07-01 DIAGNOSIS — C4492 Squamous cell carcinoma of skin, unspecified: Secondary | ICD-10-CM | POA: Diagnosis not present

## 2021-07-01 DIAGNOSIS — D2260 Melanocytic nevi of unspecified upper limb, including shoulder: Secondary | ICD-10-CM | POA: Diagnosis not present

## 2021-07-06 ENCOUNTER — Other Ambulatory Visit: Payer: Self-pay

## 2021-07-06 ENCOUNTER — Ambulatory Visit: Payer: Medicare Other | Admitting: Cardiology

## 2021-07-06 ENCOUNTER — Encounter: Payer: Self-pay | Admitting: Cardiology

## 2021-07-06 VITALS — BP 148/92 | HR 70 | Ht 66.0 in | Wt 217.0 lb

## 2021-07-06 DIAGNOSIS — R002 Palpitations: Secondary | ICD-10-CM

## 2021-07-06 DIAGNOSIS — I25119 Atherosclerotic heart disease of native coronary artery with unspecified angina pectoris: Secondary | ICD-10-CM | POA: Diagnosis not present

## 2021-07-06 DIAGNOSIS — N184 Chronic kidney disease, stage 4 (severe): Secondary | ICD-10-CM

## 2021-07-06 MED ORDER — EZETIMIBE 10 MG PO TABS: 10.0000 mg | ORAL_TABLET | Freq: Every day | ORAL | 6 refills | Status: DC

## 2021-07-06 MED ORDER — ATENOLOL 25 MG PO TABS: 25.0000 mg | ORAL_TABLET | Freq: Every day | ORAL | 3 refills | Status: DC

## 2021-07-06 NOTE — Patient Instructions (Addendum)
Medication Instructions:  Your physician has recommended you make the following change in your medication:  Increase atenolol to 25 mg daily Continue other medications the same  Labwork: none  Testing/Procedures: none  Follow-Up: Your physician recommends that you schedule a follow-up appointment in: 3 months  Any Other Special Instructions Will Be Listed Below (If Applicable).  If you need a refill on your cardiac medications before your next appointment, please call your pharmacy.

## 2021-07-06 NOTE — Progress Notes (Signed)
`    Cardiology Office Note  Date: 07/06/2021   ID: [name], DOB 04-Jan-1961, MRN [id number]  PCP:  Redmond School, MD  Cardiologist:  Rozann Lesches, MD Electrophysiologist:  None   Chief Complaint  Patient presents with   Cardiac follow-up    History of Present Illness: [name] is a 61 y.o. female last seen in February.  She is here for a follow-up visit.  Reports no definite angina symptoms, but has had an intermittent sense of rapid heartbeat associated with lightheadedness.  No obvious trigger, but has been somewhat more common when she is active.  She has not had any frank syncope.  Reports no change in her baseline cardiac regimen.  She continues to follow at [facility].  I reviewed her recent lab work which is noted below.  Past Medical History:  Diagnosis Date   Anemia    Anxiety    Ascites    CKD (chronic kidney disease) stage 4, GFR 15-29 ml/min (HCC)    Depression    Enteropathy 07/31/2015   Portal hypertensive enteropathy per capsule study, with stigmata of bleeding.   Esophageal varices (HCC)    Gastric ulcer 07/15/2015   EGD; no stigmata of bleeding   GI bleed 07/17/2015   Gram-negative bacteremia 12/08/2011   Headache(784.0)    Heart murmur    History of cirrhosis    History of hepatitis C - successfuly treated medically 07/14/2015   Nephrolithiasis    Portal vein thrombosis    Retroperitoneal hemorrhage complicating cardiac catheterization, small 11/06/2020   Right ureteral stone 12/07/2011   S/P angioplasty CTO with stent  (DES) 11/04/20 of mid LAD 11/06/2020   Status [facility] liver [facility]) [facility]   [facility]   Status post pancreas [facility] ([place]) 08[facility]   [facility]   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   [facility]   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   Type 2 diabetes mellitus ([place])     Past Surgical History:  Procedure Laterality Date   ABDOMINAL HYSTERECTOMY  2003   [place]    COLONOSCOPY     COLONOSCOPY  10/21/2011   Procedure: COLONOSCOPY;  Surgeon: Rogene Houston, MD;  [facility];  Service: Endoscopy;  Laterality: N/A;  245    CORONARY CTO INTERVENTION N/A 11/04/2020   Procedure: CORONARY CTO INTERVENTION;  Surgeon: Martinique, Peter M, MD;  Location: [place] CV LAB;  Service: Cardiovascular;  Laterality: N/A;   CYSTOSCOPY W/ RETROGRADES  12/08/2011   Procedure: CYSTOSCOPY WITH RETROGRADE PYELOGRAM;  Surgeon: Marissa Nestle, MD;  Location: [facility] OR[facility];  Service: Urology;  Laterality: Right;   CYSTOSCOPY WITH STENT PLACEMENT  12/08/2011   Procedure: CYSTOSCOPY WITH STENT PLACEMENT;  Surgeon: Marissa Nestle, MD;  Location: [facility];  Service: Urology;  Laterality: Right;   DILATION AND CURETTAGE OF UTERUS     ESOPHAGOGASTRODUODENOSCOPY N/A 02/19/2014   Procedure: ESOPHAGOGASTRODUODENOSCOPY (EGD);  Surgeon: Rogene Houston, MD;  [facility];  Service: Endoscopy;  Laterality: N/A;  100   ESOPHAGOGASTRODUODENOSCOPY N/A 07/15/2015   Procedure: ESOPHAGOGASTRODUODENOSCOPY (EGD);  Surgeon: Rogene Houston, MD;  [facility];  Service: Endoscopy;  Laterality: N/A;   ESOPHAGOGASTRODUODENOSCOPY N/A 07/29/2015   Procedure: ESOPHAGOGASTRODUODENOSCOPY (EGD);  Surgeon: Rogene Houston, MD;  [facility];  Service: Endoscopy;  Laterality: N/A;   GASTRIC BYPASS  ~ [place]  04/14/11   "one in my  bellybutton" (11/07/2012)   INGUINAL HERNIA REPAIR Right    "maybe 2" (11/07/2012)   KNEE ARTHROSCOPY Right    LEFT HEART CATH AND CORONARY ANGIOGRAPHY N/A 09/25/2020   Procedure: LEFT HEART CATH AND CORONARY ANGIOGRAPHY;  Surgeon: Sherren Mocha, MD;  Location: [place] CV LAB;  Service: Cardiovascular;  Laterality: N/A;   LITHOTRIPSY     "several times" (11/07/2012)   OPEN REDUCTION INTERNAL FIXATION (ORIF) DISTAL RADIAL FRACTURE Right 11/07/2012   Procedure: OPEN REDUCTION INTERNAL FIXATION (ORIF) RIGHT DISTAL RADIAL FRACTURE;   Surgeon: [name], MD;  Location: [facility];  Service: Orthopedics;  Laterality: Right;   ORIF DISTAL RADIUS FRACTURE Right 11/07/2012   OTHER SURGICAL HISTORY  08/14/2016   5 organ transplant- liver, stomach, pancreas, small and large intestine   UPPER GASTROINTESTINAL ENDOSCOPY      Current Outpatient Medications  Medication Sig Dispense Refill   acetaminophen (TYLENOL) 325 MG tablet Take 2 tablets (650 mg total) by mouth every 4 (four) hours as needed for headache or mild pain.     ALPRAZolam (XANAX) 1 MG tablet Take 1 tablet (1 mg total) by mouth at bedtime as needed for anxiety. 2 tablet 0   aspirin EC 81 MG tablet Take 1 tablet (81 mg total) by mouth daily. Swallow whole. 90 tablet 3   buPROPion ER (WELLBUTRIN SR) 100 MG 12 hr tablet Take 100 mg by mouth 2 (two) times daily.     calcitRIOL (ROCALTROL) 0.25 MCG capsule Take 0.25 mcg by mouth every Monday, Wednesday, and Friday.     clopidogrel (PLAVIX) 75 MG tablet Take 1 tablet (75 mg total) by mouth daily. (Patient taking differently: Take 75 mg by mouth at bedtime.) 30 tablet 11   cycloSPORINE (SANDIMMUNE) 100 MG capsule Take 100 mg by mouth daily.     cycloSPORINE (SANDIMMUNE) 25 MG capsule Take 75 mg by mouth at bedtime.     ezetimibe (ZETIA) 10 MG tablet Take 1 tablet (10 mg total) by mouth daily. 30 tablet 6   Multiple Vitamins-Minerals (SUPER THERA VITE M PO) Take 1 tablet by mouth daily.     mycophenolate (CELLCEPT) 250 MG capsule Take 250-500 mg by mouth See admin instructions. Take 500 mg in the morning. 250 mg at bedtime     nitroGLYCERIN (NITROSTAT) 0.4 MG SL tablet Place 1 tablet (0.4 mg total) under the tongue every 5 (five) minutes as needed. 25 tablet 3   Oxycodone HCl 10 MG TABS Take 10 mg by mouth every 6 (six) hours as needed (pain).     pantoprazole (PROTONIX) 40 MG tablet Take 40 mg by mouth at bedtime.     phentermine (ADIPEX-P) 37.5 MG tablet Take 37.5 mg by mouth daily.     pravastatin (PRAVACHOL) 10 MG tablet  TAKE 1 TABLET EVERY DAY AT 6 PM 30 tablet 6   traZODone (DESYREL) 50 MG tablet Take 100 mg by mouth at bedtime.     TRINTELLIX 10 MG TABS tablet Take 10 mg by mouth daily.     atenolol (TENORMIN) 25 MG tablet Take 1 tablet (25 mg total) by mouth daily. 90 tablet 3   Cyanocobalamin (B-12) 100 MCG TABS Take 1 tablet by mouth daily.     Flaxseed, Linseed, (FLAX SEED OIL) 1000 MG CAPS Take 1 capsule by mouth daily.     modafinil (PROVIGIL) 100 MG tablet Take 1 tablet (100 mg total) by mouth daily. (Patient not taking: Reported on 07/06/2021) 30 tablet 5   sodium bicarbonate 650 MG  tablet Take 650 mg by mouth 2 (two) times daily. (Patient not taking: Reported on 07/06/2021)     No current facility-administered medications for this visit.   Allergies:  Ancef [cefazolin sodium], Tapentadol, Gadobenate, Iodinated contrast media, and Tape   ROS: No orthopnea or PND.  Physical Exam: VS:  BP (!) 148/92   Pulse 70   Ht '5\' 6"'$  (1.676 m)   Wt 217 lb (98.4 kg)   SpO2 97%   BMI 35.02 kg/m , BMI Body mass index is 35.02 kg/m.  Wt Readings from Last 3 Encounters:  07/06/21 217 lb (98.4 kg)  06/16/21 215 lb 8 oz (97.8 kg)  04/06/21 215 lb 3.2 oz (97.6 kg)    General: Patient appears comfortable at rest. HEENT: Conjunctiva and lids normal. Neck: Supple, no elevated JVP or carotid bruits, no thyromegaly. Lungs: Clear to auscultation, nonlabored breathing at rest. Cardiac: Regular rate and rhythm, no S3 or significant systolic murmur, no pericardial rub. Extremities: No pitting edema.  ECG:  An ECG dated 11/04/2020 was personally reviewed today and demonstrated:  Sinus bradycardia with lead motion artifact.  Recent Labwork: 11/06/2020: BUN 33; Creatinine, Ser 2.41; Hemoglobin 8.5; Platelets 220; Potassium 5.5; Sodium 137 11/26/2020: ALT 9; AST 11     Component Value Date/Time   CHOL 135 11/26/2020 1007   TRIG 85 11/26/2020 1007   HDL 43 11/26/2020 1007   CHOLHDL 3.1 11/26/2020 1007   LDLCALC 76  11/26/2020 1007  May 2023: Hemoglobin 10.7, platelets 297, BUN 26, creatinine 2.06, potassium 5.0  Other Studies Reviewed Today:  Echocardiogram 05/14/2020:  1. Left ventricular ejection fraction, by estimation, is 60 to 65%. The  left ventricle has normal function. The left ventricle has no regional  wall motion abnormalities. There is mild left ventricular hypertrophy.  Left ventricular diastolic parameters  were normal. Normal global longitudinal strain of -19.1%.   2. Right ventricular systolic function is normal. The right ventricular  size is normal. There is normal pulmonary artery systolic pressure. The  estimated right ventricular systolic pressure is 11.9 mmHg.   3. Left atrial size was mildly dilated.   4. The mitral valve is grossly normal. Mild mitral valve regurgitation.   5. The aortic valve is tricuspid. Aortic valve regurgitation is not  visualized.   6. The inferior vena cava is normal in size with greater than 50%  respiratory variability, suggesting right atrial pressure of 3 mmHg.    CTO PCI 11/04/2020:   Mid LAD lesion is 100% stenosed.   A drug-eluting stent was successfully placed using a SYNERGY XD 2.50X32.   Post intervention, there is a 0% residual stenosis.   Successful CTO PCI of the mid LAD with IVUS guidance and DES x 1  Assessment and Plan:  1.  CAD status post CTO PCI with DES to the LAD in September 2022.  She continues on aspirin, Plavix, Pravachol, Zetia, and atenolol.  2.  Intermittent palpitations, increase atenolol to 25 mg daily and observe.  If symptoms persist will place a Zio patch.  3.  CKD stage IV, creatinine recently 2.06 which is stable.  4. 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  She continues to follow regularly on immunosuppressive therapy.  Medication Adjustments/Labs and Tests Ordered: Current medicines are reviewed at length with the patient today.  Concerns  regarding medicines are outlined above.   Tests Ordered: No orders of the defined types were placed in this encounter.   Medication  Changes: Meds ordered this encounter  Medications   atenolol (TENORMIN) 25 MG tablet    Sig: Take 1 tablet (25 mg total) by mouth daily.    Dispense:  90 tablet    Refill:  3    07/06/2021 dose increase    Disposition:  Follow up  3 months.  Signed, Satira Sark, MD, [facility] 07/06/2021 2:31 PM    [place] at [place], [place], [place] [address] Phone: [phone]; Fax: [phone]

## 2021-07-07 ENCOUNTER — Other Ambulatory Visit: Payer: Medicare Other

## 2021-07-08 ENCOUNTER — Other Ambulatory Visit: Payer: Self-pay | Admitting: *Deleted

## 2021-07-08 MED ORDER — MODAFINIL 100 MG PO TABS: 100.0000 mg | ORAL_TABLET | Freq: Every day | ORAL | 5 refills | Status: DC

## 2021-07-08 NOTE — Progress Notes (Signed)
Meds ordered this encounter  Medications   modafinil (PROVIGIL) 100 MG tablet    Sig: Take 1 tablet (100 mg total) by mouth daily.    Dispense:  30 tablet    Refill:  5    PA denied, pt will use goodrx coupon

## 2021-07-08 NOTE — Telephone Encounter (Signed)
[place] at [phone]. Spoke w/ pharmacist, Lanny Hurst. Cx rx modafinil on file there.

## 2021-07-09 ENCOUNTER — Other Ambulatory Visit: Payer: Medicare Other

## 2021-07-29 ENCOUNTER — Ambulatory Visit: Payer: Medicare Other | Admitting: Neurology

## 2021-08-09 ENCOUNTER — Other Ambulatory Visit: Payer: Self-pay | Admitting: Cardiovascular Disease

## 2021-08-09 DIAGNOSIS — I10 Essential (primary) hypertension: Secondary | ICD-10-CM | POA: Diagnosis not present

## 2021-08-09 DIAGNOSIS — G894 Chronic pain syndrome: Secondary | ICD-10-CM | POA: Diagnosis not present

## 2021-08-09 DIAGNOSIS — N184 Chronic kidney disease, stage 4 (severe): Secondary | ICD-10-CM | POA: Diagnosis not present

## 2021-09-08 DIAGNOSIS — D849 Immunodeficiency, unspecified: Secondary | ICD-10-CM | POA: Diagnosis not present

## 2021-09-08 DIAGNOSIS — Z9482 Intestine transplant status: Secondary | ICD-10-CM | POA: Diagnosis not present

## 2021-10-08 DIAGNOSIS — G894 Chronic pain syndrome: Secondary | ICD-10-CM | POA: Diagnosis not present

## 2021-10-08 DIAGNOSIS — N184 Chronic kidney disease, stage 4 (severe): Secondary | ICD-10-CM | POA: Diagnosis not present

## 2021-10-08 DIAGNOSIS — I10 Essential (primary) hypertension: Secondary | ICD-10-CM | POA: Diagnosis not present

## 2021-10-11 NOTE — Progress Notes (Signed)
Cardiology Office Note  Date: 10/12/2021   ID: [name], DOB 03/30/1960, MRN [id number]  PCP:  Redmond School, MD  Cardiologist:  Rozann Lesches, MD Electrophysiologist:  None   Chief Complaint  Patient presents with   Cardiac follow-up    History of Present Illness: [name] is a 61 y.o. female last seen in May.  She is here for a routine visit.  Reports no angina symptoms at this time, no nitroglycerin use.  She does mention intermittent episodes of lightheadedness/near syncope.  No obvious trigger, not specifically while standing and no definite association with palpitations.  We did increase her atenolol to 25 mg daily at the last visit.  She also mentions previous issues with orthostasis and frank syncope in the past, but nothing more recently.  We went over her medications which are noted below.  At this point she remains on dual antiplatelet therapy.  I also went over her recent lab work.   Past Medical History:  Diagnosis Date   Anemia    Anxiety    Ascites    CKD (chronic kidney disease) stage 4, GFR 15-29 ml/min (HCC)    Depression    Enteropathy 07/31/2015   Portal hypertensive enteropathy per capsule study, with stigmata of bleeding.   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   [facility]   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   [facility]   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    Type 2 diabetes mellitus ([place])     Past Surgical History:  Procedure Laterality Date   ABDOMINAL HYSTERECTOMY  2003   [place]   COLONOSCOPY     COLONOSCOPY  10/21/2011   Procedure: COLONOSCOPY;  Surgeon: Rogene Houston, MD;  [facility];  Service: Endoscopy;  Laterality: N/A;  245    CORONARY CTO INTERVENTION N/A 11/04/2020   Procedure: CORONARY CTO INTERVENTION;  Surgeon: Martinique, Peter M, MD;  Location: [place] CV LAB;  Service: Cardiovascular;  Laterality: N/A;   CYSTOSCOPY W/ RETROGRADES  12/08/2011   Procedure: CYSTOSCOPY WITH RETROGRADE PYELOGRAM;  Surgeon: Marissa Nestle, MD;  Location: [facility] ORS;  Service: Urology;  Laterality: Right;   CYSTOSCOPY WITH STENT PLACEMENT  12/08/2011   Procedure: CYSTOSCOPY WITH STENT PLACEMENT;  Surgeon: Marissa Nestle, MD;  Location: [facility];  Service: Urology;  Laterality: Right;   DILATION AND CURETTAGE OF UTERUS     ESOPHAGOGASTRODUODENOSCOPY N/A 02/19/2014   Procedure: ESOPHAGOGASTRODUODENOSCOPY (EGD);  Surgeon: Rogene Houston, MD;  [facility];  Service: Endoscopy;  Laterality: N/A;  100   ESOPHAGOGASTRODUODENOSCOPY N/A 07/15/2015   Procedure: ESOPHAGOGASTRODUODENOSCOPY (EGD);  Surgeon: Rogene Houston, MD;  [facility];  Service: Endoscopy;  Laterality: N/A;   ESOPHAGOGASTRODUODENOSCOPY N/A 07/29/2015   Procedure: ESOPHAGOGASTRODUODENOSCOPY (EGD);  Surgeon: Rogene Houston, MD;  Location:  [facility];  Service: Endoscopy;  Laterality: N/A;   GASTRIC BYPASS  ~ [place]  04/14/11   "one in my bellybutton" (11/07/2012)   INGUINAL HERNIA REPAIR Right    "maybe 2" (11/07/2012)   KNEE ARTHROSCOPY Right    LEFT HEART CATH AND CORONARY ANGIOGRAPHY N/A 09/25/2020   Procedure: LEFT HEART CATH AND CORONARY ANGIOGRAPHY;  Surgeon: Sherren Mocha, MD;  Location: [place] CV LAB;  Service: Cardiovascular;  Laterality: N/A;   LITHOTRIPSY     "several times"  (11/07/2012)   OPEN REDUCTION INTERNAL FIXATION (ORIF) DISTAL RADIAL FRACTURE Right 11/07/2012   Procedure: OPEN REDUCTION INTERNAL FIXATION (ORIF) RIGHT DISTAL RADIAL FRACTURE;  Surgeon: [name], MD;  Location: [place];  Service: Orthopedics;  Laterality: Right;   ORIF DISTAL RADIUS FRACTURE Right 11/07/2012   OTHER SURGICAL HISTORY  08/14/2016   5 organ transplant- liver, stomach, pancreas, small and large intestine   UPPER GASTROINTESTINAL ENDOSCOPY      Current Outpatient Medications  Medication Sig Dispense Refill   acetaminophen (TYLENOL) 325 MG tablet Take 2 tablets (650 mg total) by mouth every 4 (four) hours as needed for headache or mild pain.     aspirin EC 81 MG tablet Take 1 tablet (81 mg total) by mouth daily. Swallow whole. 90 tablet 3   atenolol (TENORMIN) 25 MG tablet Take 1 tablet (25 mg total) by mouth daily. 90 tablet 3   buPROPion ER (WELLBUTRIN SR) 100 MG 12 hr tablet Take 100 mg by mouth 2 (two) times daily.     calcitRIOL (ROCALTROL) 0.25 MCG capsule Take 0.25 mcg by mouth every Monday, Wednesday, and Friday.     clopidogrel (PLAVIX) 75 MG tablet TAKE 1 TABLET ONCE DAILY. 30 tablet 5   Cyanocobalamin (B-12) 100 MCG TABS Take 1 tablet by mouth daily.     cycloSPORINE (SANDIMMUNE) 100 MG capsule Take 100 mg by mouth daily.     cycloSPORINE (SANDIMMUNE) 25 MG capsule Take 75 mg by mouth at bedtime.     ezetimibe (ZETIA) 10 MG tablet Take 1 tablet (10 mg total) by mouth daily. 30 tablet 6   Flaxseed, Linseed, (FLAX SEED OIL) 1000 MG CAPS Take 1 capsule by mouth daily.     modafinil (PROVIGIL) 100 MG tablet Take 1 tablet (100 mg total) by mouth daily. 30 tablet 5   Multiple Vitamins-Minerals (SUPER THERA VITE M PO) Take 1 tablet by mouth daily.     mycophenolate (CELLCEPT) 250 MG capsule Take 250-500 mg by mouth See admin instructions. Take 500 mg in the morning. 250 mg at bedtime     nitroGLYCERIN (NITROSTAT) 0.4 MG SL tablet Place 1 tablet (0.4 mg total) under the  tongue every 5 (five) minutes as needed. 25 tablet 3   Oxycodone HCl 10 MG TABS Take 10 mg by mouth every 6 (six) hours as needed (pain).     pantoprazole (PROTONIX) 40 MG tablet Take 40 mg by mouth at bedtime.     phentermine (ADIPEX-P) 37.5 MG tablet Take 37.5 mg by mouth daily.     pravastatin (PRAVACHOL) 10 MG tablet TAKE 1 TABLET EVERY DAY AT 6 PM 30 tablet 6   traZODone (DESYREL) 50 MG tablet Take 100 mg by mouth at bedtime.     TRINTELLIX 10 MG TABS tablet Take 10 mg by mouth daily.     No current facility-administered medications for this visit.   Allergies:  Ancef [cefazolin sodium], Tapentadol, Gadobenate, Iodinated contrast media, and Tape  ROS: No orthopnea or PND.  Physical Exam: VS:  BP 128/82   Pulse (!) 57   Ht '5\' 6"'$  (1.676 m)   Wt 211 lb 12.8 oz (96.1 kg)   SpO2 97%   BMI 34.19 kg/m , BMI Body mass index is 34.19 kg/m.  Wt Readings from Last 3 Encounters:  10/12/21 211 lb 12.8 oz (96.1 kg)  07/06/21 217 lb (98.4 kg)  06/16/21 215 lb 8 oz (97.8 kg)    General: Patient appears comfortable at rest. HEENT: Conjunctiva and lids normal. Neck: Supple, no elevated JVP or carotid bruits, no thyromegaly. Lungs: Clear to auscultation, nonlabored breathing at rest. Cardiac: Regular rate and rhythm, no S3 or significant systolic murmur. Extremities: No pitting edema.  ECG:  An ECG dated 11/04/2020 was personally reviewed today and demonstrated:  Sinus bradycardia with lead motion artifact.  Recent Labwork: 11/06/2020: BUN 33; Creatinine, Ser 2.41; Hemoglobin 8.5; Platelets 220; Potassium 5.5; Sodium 137 11/26/2020: ALT 9; AST 11     Component Value Date/Time   CHOL 135 11/26/2020 1007   TRIG 85 11/26/2020 1007   HDL 43 11/26/2020 1007   CHOLHDL 3.1 11/26/2020 1007   LDLCALC 76 11/26/2020 1007  August 2023: Hemoglobin 11.1, BUN 23, creatinine 2.37, potassium 5.4, AST 19, ALT 14  Other Studies Reviewed Today:  Echocardiogram 05/14/2020:  1. Left ventricular  ejection fraction, by estimation, is 60 to 65%. The  left ventricle has normal function. The left ventricle has no regional  wall motion abnormalities. There is mild left ventricular hypertrophy.  Left ventricular diastolic parameters  were normal. Normal global longitudinal strain of -19.1%.   2. Right ventricular systolic function is normal. The right ventricular  size is normal. There is normal pulmonary artery systolic pressure. The  estimated right ventricular systolic pressure is 37.8 mmHg.   3. Left atrial size was mildly dilated.   4. The mitral valve is grossly normal. Mild mitral valve regurgitation.   5. The aortic valve is tricuspid. Aortic valve regurgitation is not  visualized.   6. The inferior vena cava is normal in size with greater than 50%  respiratory variability, suggesting right atrial pressure of 3 mmHg.    CTO PCI 11/04/2020:   Mid LAD lesion is 100% stenosed.   A drug-eluting stent was successfully placed using a SYNERGY XD 2.50X32.   Post intervention, there is a 0% residual stenosis.   Successful CTO PCI of the mid LAD with IVUS guidance and DES x 1  Assessment and Plan:  1.  CAD status post CTO PCI with DES to the LAD in September 2022.  No active angina at this time on medical therapy.  She continues on aspirin and Plavix, also atenolol, Pravachol, and Zetia.  2.  Intermittent lightheadedness/near syncope.  No definite palpitations with the current symptoms.  She does have a prior history of reported orthostasis and frank syncope although none recently.  Plan to obtain a 14-day Zio patch mainly to exclude any associated bradyarrhythmias or tachyarrhythmias that would need different management course.  No change in atenolol for now.  3.  CKD stage IV, recent creatinine 2.37.  4.  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  She continues to follow regularly on immunosuppressive therapy.  Medication  Adjustments/Labs and Tests Ordered: Current medicines are reviewed at length with the patient today.  Concerns regarding medicines are outlined above.   Tests Ordered: No orders of the defined types were placed in  this encounter.   Medication Changes: No orders of the defined types were placed in this encounter.   Disposition:  Follow up  test results.  Signed, Satira Sark, MD, [facility] 10/12/2021 11:00 AM    [place] at [place], [place], [place] [address] Phone: [phone]; Fax: [phone]

## 2021-10-12 ENCOUNTER — Ambulatory Visit: Payer: Medicare Other | Attending: Cardiology

## 2021-10-12 ENCOUNTER — Encounter: Payer: Self-pay | Admitting: Cardiology

## 2021-10-12 ENCOUNTER — Ambulatory Visit: Payer: Medicare Other | Attending: Cardiology | Admitting: Cardiology

## 2021-10-12 ENCOUNTER — Telehealth: Payer: Self-pay | Admitting: Cardiology

## 2021-10-12 ENCOUNTER — Other Ambulatory Visit: Payer: Self-pay | Admitting: Cardiology

## 2021-10-12 VITALS — BP 128/82 | HR 57 | Ht 66.0 in | Wt 211.8 lb

## 2021-10-12 DIAGNOSIS — N184 Chronic kidney disease, stage 4 (severe): Secondary | ICD-10-CM | POA: Diagnosis not present

## 2021-10-12 DIAGNOSIS — R55 Syncope and collapse: Secondary | ICD-10-CM

## 2021-10-12 DIAGNOSIS — I25119 Atherosclerotic heart disease of native coronary artery with unspecified angina pectoris: Secondary | ICD-10-CM | POA: Diagnosis not present

## 2021-10-12 NOTE — Patient Instructions (Signed)
Medication Instructions:  Your physician recommends that you continue on your current medications as directed. Please refer to the Current Medication list given to you today.   Labwork: none  Testing/Procedures: ZIO- Long Term Monitor Instructions   Your physician has requested you wear your ZIO patch monitor 14 days.   This is a single patch monitor.  [employer] supplies one patch monitor per enrollment.  Additional stickers are not available.   Please do not apply patch if you will be having a Nuclear Stress Test, Echocardiogram, Cardiac CT, MRI, or Chest Xray during the time frame you would be wearing the monitor. The patch cannot be worn during these tests.  You cannot remove and re-apply the ZIO XT patch monitor.      Do not shower for the first 24 hours.  You may shower after the first 24 hours.   Press button if you feel a symptom. You will hear a small click.  Record Date, Time and Symptom in the Patient Log Book.   When you are ready to remove patch, follow instructions on last 2 pages of Patient Log Book.  Stick patch monitor onto last page of Patient Log Book.   Place Patient Log Book in [place] box.  Use locking tab on box and tape box closed securely.  The [employer] has [employer] on it.  Please place in mailbox as soon as possible.  Your physician should have your test results approximately 7 days after the monitor has been mailed back to [facility].   Call [place] at [phone] if you have questions regarding your ZIO XT patch monitor.  Call them immediately if you see an orange light blinking on your monitor.   If your monitor falls off in less than 4 days contact our Monitor department at [phone].  If your monitor becomes loose or falls off after 4 days call [employer] at [phone] for suggestions on securing your monitor.   Follow-Up:  Your physician recommends that you schedule a follow-up appointment in: F/u Pending  Any  Other Special Instructions Will Be Listed Below (If Applicable).  If you need a refill on your cardiac medications before your next appointment, please call your pharmacy.

## 2021-10-12 NOTE — Telephone Encounter (Signed)
PERCERT:   14 day Zio XT dx: presyncope

## 2021-10-28 DIAGNOSIS — R55 Syncope and collapse: Secondary | ICD-10-CM | POA: Diagnosis not present

## 2021-11-01 ENCOUNTER — Telehealth: Payer: Self-pay | Admitting: *Deleted

## 2021-11-01 NOTE — Telephone Encounter (Signed)
Patient informed and verbalized understanding of plan. Recall placed

## 2021-11-01 NOTE — Telephone Encounter (Signed)
-----   Message from Satira Sark, MD sent at 10/31/2021  5:58 PM EDT ----- Regarding: RE: Follow up was pending test result? When should she follow up? 6 months please. ----- Message ----- From: Merlene Laughter, RN Sent: 10/29/2021   3:37 PM EDT To: Satira Sark, MD Subject: Follow up was pending test result? When shou#

## 2021-11-08 ENCOUNTER — Other Ambulatory Visit: Payer: Self-pay | Admitting: Cardiology

## 2021-11-10 DIAGNOSIS — D849 Immunodeficiency, unspecified: Secondary | ICD-10-CM | POA: Diagnosis not present

## 2021-11-10 DIAGNOSIS — Z9482 Intestine transplant status: Secondary | ICD-10-CM | POA: Diagnosis not present

## 2021-12-08 DIAGNOSIS — Z9482 Intestine transplant status: Secondary | ICD-10-CM | POA: Diagnosis not present

## 2021-12-08 DIAGNOSIS — D849 Immunodeficiency, unspecified: Secondary | ICD-10-CM | POA: Diagnosis not present

## 2022-01-10 DIAGNOSIS — U071 COVID-19: Secondary | ICD-10-CM | POA: Diagnosis not present

## 2022-01-10 DIAGNOSIS — Z6833 Body mass index (BMI) 33.0-33.9, adult: Secondary | ICD-10-CM | POA: Diagnosis not present

## 2022-01-10 DIAGNOSIS — E6609 Other obesity due to excess calories: Secondary | ICD-10-CM | POA: Diagnosis not present

## 2022-01-15 ENCOUNTER — Other Ambulatory Visit: Payer: Self-pay | Admitting: Neurology

## 2022-01-21 DIAGNOSIS — D849 Immunodeficiency, unspecified: Secondary | ICD-10-CM | POA: Diagnosis not present

## 2022-01-21 DIAGNOSIS — Z9482 Intestine transplant status: Secondary | ICD-10-CM | POA: Diagnosis not present

## 2022-01-26 DIAGNOSIS — D849 Immunodeficiency, unspecified: Secondary | ICD-10-CM | POA: Diagnosis not present

## 2022-01-26 DIAGNOSIS — Z9482 Intestine transplant status: Secondary | ICD-10-CM | POA: Diagnosis not present

## 2022-02-08 ENCOUNTER — Other Ambulatory Visit: Payer: Self-pay | Admitting: Cardiology

## 2022-02-10 DIAGNOSIS — Z9482 Intestine transplant status: Secondary | ICD-10-CM | POA: Diagnosis not present

## 2022-02-10 DIAGNOSIS — D849 Immunodeficiency, unspecified: Secondary | ICD-10-CM | POA: Diagnosis not present

## 2022-02-11 ENCOUNTER — Other Ambulatory Visit: Payer: Self-pay | Admitting: Internal Medicine

## 2022-02-11 DIAGNOSIS — Z1231 Encounter for screening mammogram for malignant neoplasm of breast: Secondary | ICD-10-CM

## 2022-02-14 ENCOUNTER — Ambulatory Visit
Admission: RE | Admit: 2022-02-14 | Discharge: 2022-02-14 | Disposition: A | Discharge status: Home / Self Care | Payer: Medicare Other | Source: Ambulatory Visit | Attending: Internal Medicine | Admitting: Internal Medicine

## 2022-02-14 DIAGNOSIS — Z1231 Encounter for screening mammogram for malignant neoplasm of breast: Secondary | ICD-10-CM | POA: Diagnosis not present

## 2022-02-15 ENCOUNTER — Other Ambulatory Visit (HOSPITAL_COMMUNITY): Payer: Self-pay | Admitting: Family Medicine

## 2022-02-15 DIAGNOSIS — Z1231 Encounter for screening mammogram for malignant neoplasm of breast: Secondary | ICD-10-CM

## 2022-02-21 DIAGNOSIS — Z6833 Body mass index (BMI) 33.0-33.9, adult: Secondary | ICD-10-CM | POA: Diagnosis not present

## 2022-02-21 DIAGNOSIS — E6609 Other obesity due to excess calories: Secondary | ICD-10-CM | POA: Diagnosis not present

## 2022-02-21 DIAGNOSIS — B182 Chronic viral hepatitis C: Secondary | ICD-10-CM | POA: Diagnosis not present

## 2022-02-21 DIAGNOSIS — Z944 Liver transplant status: Secondary | ICD-10-CM | POA: Diagnosis not present

## 2022-02-21 DIAGNOSIS — I209 Angina pectoris, unspecified: Secondary | ICD-10-CM | POA: Diagnosis not present

## 2022-02-21 DIAGNOSIS — I8500 Esophageal varices without bleeding: Secondary | ICD-10-CM | POA: Diagnosis not present

## 2022-02-21 DIAGNOSIS — D696 Thrombocytopenia, unspecified: Secondary | ICD-10-CM | POA: Diagnosis not present

## 2022-02-21 DIAGNOSIS — I2510 Atherosclerotic heart disease of native coronary artery without angina pectoris: Secondary | ICD-10-CM | POA: Diagnosis not present

## 2022-02-21 DIAGNOSIS — D849 Immunodeficiency, unspecified: Secondary | ICD-10-CM | POA: Diagnosis not present

## 2022-02-21 DIAGNOSIS — J029 Acute pharyngitis, unspecified: Secondary | ICD-10-CM | POA: Diagnosis not present

## 2022-02-21 DIAGNOSIS — E118 Type 2 diabetes mellitus with unspecified complications: Secondary | ICD-10-CM | POA: Diagnosis not present

## 2022-03-17 DIAGNOSIS — D849 Immunodeficiency, unspecified: Secondary | ICD-10-CM | POA: Diagnosis not present

## 2022-03-17 DIAGNOSIS — K080 Exfoliation of teeth due to systemic causes: Secondary | ICD-10-CM | POA: Diagnosis not present

## 2022-03-17 DIAGNOSIS — Z9482 Intestine transplant status: Secondary | ICD-10-CM | POA: Diagnosis not present

## 2022-03-22 DIAGNOSIS — D849 Immunodeficiency, unspecified: Secondary | ICD-10-CM | POA: Diagnosis not present

## 2022-03-22 DIAGNOSIS — Z9482 Intestine transplant status: Secondary | ICD-10-CM | POA: Diagnosis not present

## 2022-04-04 DIAGNOSIS — K080 Exfoliation of teeth due to systemic causes: Secondary | ICD-10-CM | POA: Diagnosis not present

## 2022-04-08 DIAGNOSIS — F331 Major depressive disorder, recurrent, moderate: Secondary | ICD-10-CM | POA: Diagnosis not present

## 2022-04-12 DIAGNOSIS — F331 Major depressive disorder, recurrent, moderate: Secondary | ICD-10-CM | POA: Diagnosis not present

## 2022-04-12 DIAGNOSIS — L821 Other seborrheic keratosis: Secondary | ICD-10-CM | POA: Diagnosis not present

## 2022-04-12 DIAGNOSIS — D849 Immunodeficiency, unspecified: Secondary | ICD-10-CM | POA: Diagnosis not present

## 2022-04-12 DIAGNOSIS — R5383 Other fatigue: Secondary | ICD-10-CM | POA: Diagnosis not present

## 2022-04-12 DIAGNOSIS — L814 Other melanin hyperpigmentation: Secondary | ICD-10-CM | POA: Diagnosis not present

## 2022-04-12 DIAGNOSIS — D2270 Melanocytic nevi of unspecified lower limb, including hip: Secondary | ICD-10-CM | POA: Diagnosis not present

## 2022-04-12 DIAGNOSIS — Z85828 Personal history of other malignant neoplasm of skin: Secondary | ICD-10-CM | POA: Diagnosis not present

## 2022-04-12 DIAGNOSIS — D225 Melanocytic nevi of trunk: Secondary | ICD-10-CM | POA: Diagnosis not present

## 2022-04-12 DIAGNOSIS — D2260 Melanocytic nevi of unspecified upper limb, including shoulder: Secondary | ICD-10-CM | POA: Diagnosis not present

## 2022-04-12 DIAGNOSIS — D485 Neoplasm of uncertain behavior of skin: Secondary | ICD-10-CM | POA: Diagnosis not present

## 2022-04-12 DIAGNOSIS — G4709 Other insomnia: Secondary | ICD-10-CM | POA: Diagnosis not present

## 2022-04-12 DIAGNOSIS — R6889 Other general symptoms and signs: Secondary | ICD-10-CM | POA: Diagnosis not present

## 2022-04-12 DIAGNOSIS — F419 Anxiety disorder, unspecified: Secondary | ICD-10-CM | POA: Diagnosis not present

## 2022-04-12 DIAGNOSIS — R7989 Other specified abnormal findings of blood chemistry: Secondary | ICD-10-CM | POA: Diagnosis not present

## 2022-04-12 DIAGNOSIS — L578 Other skin changes due to chronic exposure to nonionizing radiation: Secondary | ICD-10-CM | POA: Diagnosis not present

## 2022-04-12 DIAGNOSIS — Z23 Encounter for immunization: Secondary | ICD-10-CM | POA: Diagnosis not present

## 2022-04-27 DIAGNOSIS — D849 Immunodeficiency, unspecified: Secondary | ICD-10-CM | POA: Diagnosis not present

## 2022-04-27 DIAGNOSIS — Z9482 Intestine transplant status: Secondary | ICD-10-CM | POA: Diagnosis not present

## 2022-05-28 DIAGNOSIS — H04123 Dry eye syndrome of bilateral lacrimal glands: Secondary | ICD-10-CM | POA: Diagnosis not present

## 2022-05-28 DIAGNOSIS — H40033 Anatomical narrow angle, bilateral: Secondary | ICD-10-CM | POA: Diagnosis not present

## 2022-05-31 DIAGNOSIS — N3000 Acute cystitis without hematuria: Secondary | ICD-10-CM | POA: Diagnosis not present

## 2022-05-31 DIAGNOSIS — N202 Calculus of kidney with calculus of ureter: Secondary | ICD-10-CM | POA: Diagnosis not present

## 2022-06-06 DIAGNOSIS — B182 Chronic viral hepatitis C: Secondary | ICD-10-CM | POA: Diagnosis not present

## 2022-06-06 DIAGNOSIS — R7309 Other abnormal glucose: Secondary | ICD-10-CM | POA: Diagnosis not present

## 2022-06-06 DIAGNOSIS — D849 Immunodeficiency, unspecified: Secondary | ICD-10-CM | POA: Diagnosis not present

## 2022-06-06 DIAGNOSIS — Z1331 Encounter for screening for depression: Secondary | ICD-10-CM | POA: Diagnosis not present

## 2022-06-06 DIAGNOSIS — I25119 Atherosclerotic heart disease of native coronary artery with unspecified angina pectoris: Secondary | ICD-10-CM | POA: Diagnosis not present

## 2022-06-06 DIAGNOSIS — E6609 Other obesity due to excess calories: Secondary | ICD-10-CM | POA: Diagnosis not present

## 2022-06-06 DIAGNOSIS — K7460 Unspecified cirrhosis of liver: Secondary | ICD-10-CM | POA: Diagnosis not present

## 2022-06-06 DIAGNOSIS — Z0001 Encounter for general adult medical examination with abnormal findings: Secondary | ICD-10-CM | POA: Diagnosis not present

## 2022-06-06 DIAGNOSIS — Z6832 Body mass index (BMI) 32.0-32.9, adult: Secondary | ICD-10-CM | POA: Diagnosis not present

## 2022-06-06 DIAGNOSIS — Z944 Liver transplant status: Secondary | ICD-10-CM | POA: Diagnosis not present

## 2022-06-07 DIAGNOSIS — D518 Other vitamin B12 deficiency anemias: Secondary | ICD-10-CM | POA: Diagnosis not present

## 2022-06-07 DIAGNOSIS — G9332 Myalgic encephalomyelitis/chronic fatigue syndrome: Secondary | ICD-10-CM | POA: Diagnosis not present

## 2022-06-07 DIAGNOSIS — D849 Immunodeficiency, unspecified: Secondary | ICD-10-CM | POA: Diagnosis not present

## 2022-06-07 DIAGNOSIS — E559 Vitamin D deficiency, unspecified: Secondary | ICD-10-CM | POA: Diagnosis not present

## 2022-06-07 DIAGNOSIS — R7989 Other specified abnormal findings of blood chemistry: Secondary | ICD-10-CM | POA: Diagnosis not present

## 2022-06-07 DIAGNOSIS — E118 Type 2 diabetes mellitus with unspecified complications: Secondary | ICD-10-CM | POA: Diagnosis not present

## 2022-06-07 DIAGNOSIS — Z9482 Intestine transplant status: Secondary | ICD-10-CM | POA: Diagnosis not present

## 2022-06-13 ENCOUNTER — Other Ambulatory Visit: Payer: Self-pay | Admitting: Cardiology

## 2022-06-28 DIAGNOSIS — D849 Immunodeficiency, unspecified: Secondary | ICD-10-CM | POA: Diagnosis not present

## 2022-06-28 DIAGNOSIS — Z9482 Intestine transplant status: Secondary | ICD-10-CM | POA: Diagnosis not present

## 2022-06-30 DIAGNOSIS — N3000 Acute cystitis without hematuria: Secondary | ICD-10-CM | POA: Diagnosis not present

## 2022-07-01 DIAGNOSIS — F410 Panic disorder [episodic paroxysmal anxiety] without agoraphobia: Secondary | ICD-10-CM | POA: Diagnosis not present

## 2022-07-01 DIAGNOSIS — G4709 Other insomnia: Secondary | ICD-10-CM | POA: Diagnosis not present

## 2022-07-01 DIAGNOSIS — F3289 Other specified depressive episodes: Secondary | ICD-10-CM | POA: Diagnosis not present

## 2022-07-10 ENCOUNTER — Other Ambulatory Visit: Payer: Self-pay | Admitting: Cardiology

## 2022-07-12 DIAGNOSIS — C4402 Squamous cell carcinoma of skin of lip: Secondary | ICD-10-CM | POA: Diagnosis not present

## 2022-07-19 NOTE — Progress Notes (Unsigned)
    Cardiology Office Note  Date: 07/20/2022   ID: [name], DOB 1960-02-19, MRN [id number]  History of Present Illness: [name] is a 62 y.o. female last seen in September 2023.  She is here for a routine visit.  Reports stable angina, no increasing nitroglycerin use.  Baseline NYHA class II dyspnea, no palpitations or syncope.  I went over her cardiac medications which are stable.  We discussed attempted up titration of Pravachol to 20 mg daily, continue Zetia.  Her last LDL was 76 in April.  Otherwise no change in cardiac regimen.  ECG today shows sinus bradycardia.  She continues to follow regularly at [facility], I reviewed her interval lab work.  Physical Exam: VS:  BP 132/84   Pulse (!) 56   Ht 5\' 4"  (1.626 m)   Wt 201 lb 12.8 oz (91.5 kg)   SpO2 93%   BMI 34.64 kg/m , BMI Body mass index is 34.64 kg/m.  Wt Readings from Last 3 Encounters:  07/20/22 201 lb 12.8 oz (91.5 kg)  10/12/21 211 lb 12.8 oz (96.1 kg)  07/06/21 217 lb (98.4 kg)    General: Patient appears comfortable at rest. HEENT: Conjunctiva and lids normal. Neck: Supple, no elevated JVP or carotid bruits. Lungs: Clear to auscultation, nonlabored breathing at rest. Cardiac: Regular rate and rhythm, no S3 or significant systolic murmur. Extremities: No pitting edema.  ECG:  An ECG dated 11/04/2020 was personally reviewed today and demonstrated:  Sinus bradycardia with lead motion artifact.  Labwork:  April 2024: Cholesterol 140, triglycerides 175, HDL 34, LDL 76 May 2024: BUN 29, creatinine 2.43, potassium 5.3, AST 18, ALT 26, GFR 22, hemoglobin 10, platelets 303, magnesium 1.9  Other Studies Reviewed Today:  Cardiac monitor September 2023: ZIO XT reviewed.  12 days, 15 hours analyzed.   Predominant rhythm is sinus with heart rate ranging from 41 bpm up to 126 bpm and average heart rate 62 bpm. There were rare PACs including atrial couplets and triplets representing less than 1% total beats. There were  rare PVCs representing less than 1% total beats. Single run of SVT noted, very brief lasting only 5 beats.   No sustained arrhythmias or pauses.  Assessment and Plan:  1.  CAD status post CTO PCI with DES to the LAD in September 2022.  LVEF 60 to 65% by echocardiogram at that time.  She reports stable angina on medical therapy, ECG reviewed today and stable as well.  Continue aspirin, Plavix, atenolol, Pravachol, Zetia, and as needed nitroglycerin.  2.  Mixed hyperlipidemia, LDL 76 in April.  Attempt increase in Pravachol to 20 mg daily and continue Zetia.  Recheck FLP for next visit.  3.  CKD stage IV, creatinine 2.43 in May.  4.  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  She continues to follow regularly on immunosuppressive treatment.  Disposition:  Follow up  6 months.  Signed, Jonelle Sidle, M.D., F.A.C.C. [facility] at [facility]

## 2022-07-20 ENCOUNTER — Ambulatory Visit: Payer: Medicare Other | Attending: Cardiology | Admitting: Cardiology

## 2022-07-20 ENCOUNTER — Encounter: Payer: Self-pay | Admitting: Cardiology

## 2022-07-20 VITALS — BP 132/84 | HR 56 | Ht 64.0 in | Wt 201.8 lb

## 2022-07-20 DIAGNOSIS — E782 Mixed hyperlipidemia: Secondary | ICD-10-CM

## 2022-07-20 DIAGNOSIS — I25119 Atherosclerotic heart disease of native coronary artery with unspecified angina pectoris: Secondary | ICD-10-CM | POA: Diagnosis not present

## 2022-07-20 DIAGNOSIS — Z79899 Other long term (current) drug therapy: Secondary | ICD-10-CM | POA: Diagnosis not present

## 2022-07-20 DIAGNOSIS — N184 Chronic kidney disease, stage 4 (severe): Secondary | ICD-10-CM | POA: Diagnosis not present

## 2022-07-20 MED ORDER — PRAVASTATIN SODIUM 20 MG PO TABS: 20.0000 mg | ORAL_TABLET | Freq: Every day | ORAL | 2 refills | Status: DC

## 2022-07-20 NOTE — Patient Instructions (Addendum)
Medication Instructions:  Your physician has recommended you make the following change in your medication:  Increase pravastatin to 20 mg daily Continue all other medications the same  Labwork: Your physician recommends that you return for a FASTING lipid profile: 6 months just before your next visit. Please do not eat or drink for at least 8 hours when you have this done. You may take your medications that morning with a sip of water. [facility]  Testing/Procedures: none  Follow-Up: Your physician recommends that you schedule a follow-up appointment in: 6 months  Any Other Special Instructions Will Be Listed Below (If Applicable).  If you need a refill on your cardiac medications before your next appointment, please call your pharmacy.

## 2022-07-21 DIAGNOSIS — Z7682 Awaiting organ transplant status: Secondary | ICD-10-CM | POA: Diagnosis not present

## 2022-07-21 DIAGNOSIS — L821 Other seborrheic keratosis: Secondary | ICD-10-CM | POA: Diagnosis not present

## 2022-07-21 DIAGNOSIS — L820 Inflamed seborrheic keratosis: Secondary | ICD-10-CM | POA: Diagnosis not present

## 2022-07-21 DIAGNOSIS — L814 Other melanin hyperpigmentation: Secondary | ICD-10-CM | POA: Diagnosis not present

## 2022-07-21 DIAGNOSIS — L570 Actinic keratosis: Secondary | ICD-10-CM | POA: Diagnosis not present

## 2022-07-21 DIAGNOSIS — D849 Immunodeficiency, unspecified: Secondary | ICD-10-CM | POA: Diagnosis not present

## 2022-08-09 DIAGNOSIS — Z9482 Intestine transplant status: Secondary | ICD-10-CM | POA: Diagnosis not present

## 2022-08-09 DIAGNOSIS — D849 Immunodeficiency, unspecified: Secondary | ICD-10-CM | POA: Diagnosis not present

## 2022-08-18 DIAGNOSIS — Z008 Encounter for other general examination: Secondary | ICD-10-CM | POA: Diagnosis not present

## 2022-08-18 DIAGNOSIS — Z944 Liver transplant status: Secondary | ICD-10-CM | POA: Diagnosis not present

## 2022-08-18 DIAGNOSIS — Z9482 Intestine transplant status: Secondary | ICD-10-CM | POA: Diagnosis not present

## 2022-08-19 ENCOUNTER — Encounter: Payer: Self-pay | Admitting: Cardiology

## 2022-08-19 ENCOUNTER — Other Ambulatory Visit: Payer: Self-pay

## 2022-08-19 MED ORDER — ATENOLOL 25 MG PO TABS: 12.5000 mg | ORAL_TABLET | Freq: Every day | ORAL | 3 refills | Status: DC

## 2022-09-01 ENCOUNTER — Telehealth: Payer: Self-pay | Admitting: Cardiology

## 2022-09-01 DIAGNOSIS — T50905A Adverse effect of unspecified drugs, medicaments and biological substances, initial encounter: Secondary | ICD-10-CM | POA: Diagnosis not present

## 2022-09-01 DIAGNOSIS — I25119 Atherosclerotic heart disease of native coronary artery with unspecified angina pectoris: Secondary | ICD-10-CM | POA: Diagnosis not present

## 2022-09-01 DIAGNOSIS — N184 Chronic kidney disease, stage 4 (severe): Secondary | ICD-10-CM | POA: Diagnosis not present

## 2022-09-01 DIAGNOSIS — Z6831 Body mass index (BMI) 31.0-31.9, adult: Secondary | ICD-10-CM | POA: Diagnosis not present

## 2022-09-01 DIAGNOSIS — E119 Type 2 diabetes mellitus without complications: Secondary | ICD-10-CM | POA: Diagnosis not present

## 2022-09-01 DIAGNOSIS — I10 Essential (primary) hypertension: Secondary | ICD-10-CM | POA: Diagnosis not present

## 2022-09-01 NOTE — Telephone Encounter (Signed)
Dr calling to state that the pt's heart rate was 48 when she went to their office on 7/11. Dr wanted her to call to inform us. Please advise.

## 2022-09-01 NOTE — Telephone Encounter (Signed)
Spoke with patient - she had already let us know about her low HR & made recommendations.  She will be sending in readings the end of this week for provider review.

## 2022-09-06 DIAGNOSIS — F3289 Other specified depressive episodes: Secondary | ICD-10-CM | POA: Diagnosis not present

## 2022-09-06 DIAGNOSIS — G4709 Other insomnia: Secondary | ICD-10-CM | POA: Diagnosis not present

## 2022-09-06 DIAGNOSIS — R5383 Other fatigue: Secondary | ICD-10-CM | POA: Diagnosis not present

## 2022-09-06 DIAGNOSIS — Z9482 Intestine transplant status: Secondary | ICD-10-CM | POA: Diagnosis not present

## 2022-09-14 ENCOUNTER — Other Ambulatory Visit (HOSPITAL_COMMUNITY): Payer: Self-pay | Admitting: Internal Medicine

## 2022-09-14 ENCOUNTER — Other Ambulatory Visit: Payer: Self-pay

## 2022-09-14 ENCOUNTER — Other Ambulatory Visit: Payer: Self-pay | Admitting: Internal Medicine

## 2022-09-14 ENCOUNTER — Emergency Department (HOSPITAL_COMMUNITY)
Admission: EM | Admit: 2022-09-14 | Discharge: 2022-09-15 | Disposition: A | Discharge status: Home / Self Care | Payer: Medicare Other | Attending: Emergency Medicine | Admitting: Emergency Medicine

## 2022-09-14 ENCOUNTER — Emergency Department (HOSPITAL_COMMUNITY)
Admission: RE | Admit: 2022-09-14 | Discharge: 2022-09-14 | Disposition: A | Discharge status: Home / Self Care | Payer: Medicare Other | Source: Ambulatory Visit | Attending: Internal Medicine | Admitting: Internal Medicine

## 2022-09-14 ENCOUNTER — Emergency Department (HOSPITAL_COMMUNITY): Payer: Medicare Other

## 2022-09-14 ENCOUNTER — Encounter (HOSPITAL_COMMUNITY): Payer: Self-pay | Admitting: Emergency Medicine

## 2022-09-14 DIAGNOSIS — E1122 Type 2 diabetes mellitus with diabetic chronic kidney disease: Secondary | ICD-10-CM | POA: Diagnosis not present

## 2022-09-14 DIAGNOSIS — Z955 Presence of coronary angioplasty implant and graft: Secondary | ICD-10-CM | POA: Diagnosis not present

## 2022-09-14 DIAGNOSIS — N184 Chronic kidney disease, stage 4 (severe): Secondary | ICD-10-CM | POA: Diagnosis not present

## 2022-09-14 DIAGNOSIS — K5730 Diverticulosis of large intestine without perforation or abscess without bleeding: Secondary | ICD-10-CM | POA: Insufficient documentation

## 2022-09-14 DIAGNOSIS — N23 Unspecified renal colic: Secondary | ICD-10-CM

## 2022-09-14 DIAGNOSIS — R10A0 Flank pain, unspecified side: Secondary | ICD-10-CM

## 2022-09-14 DIAGNOSIS — M47816 Spondylosis without myelopathy or radiculopathy, lumbar region: Secondary | ICD-10-CM | POA: Diagnosis not present

## 2022-09-14 DIAGNOSIS — Z6831 Body mass index (BMI) 31.0-31.9, adult: Secondary | ICD-10-CM | POA: Diagnosis not present

## 2022-09-14 DIAGNOSIS — R188 Other ascites: Secondary | ICD-10-CM | POA: Diagnosis not present

## 2022-09-14 DIAGNOSIS — R109 Unspecified abdominal pain: Secondary | ICD-10-CM | POA: Diagnosis not present

## 2022-09-14 DIAGNOSIS — Z87891 Personal history of nicotine dependence: Secondary | ICD-10-CM | POA: Diagnosis not present

## 2022-09-14 DIAGNOSIS — K56609 Unspecified intestinal obstruction, unspecified as to partial versus complete obstruction: Secondary | ICD-10-CM | POA: Diagnosis not present

## 2022-09-14 DIAGNOSIS — Z9483 Pancreas transplant status: Secondary | ICD-10-CM | POA: Insufficient documentation

## 2022-09-14 DIAGNOSIS — R1030 Lower abdominal pain, unspecified: Secondary | ICD-10-CM | POA: Diagnosis not present

## 2022-09-14 DIAGNOSIS — E6609 Other obesity due to excess calories: Secondary | ICD-10-CM | POA: Diagnosis not present

## 2022-09-14 DIAGNOSIS — I700 Atherosclerosis of aorta: Secondary | ICD-10-CM | POA: Insufficient documentation

## 2022-09-14 DIAGNOSIS — Z7982 Long term (current) use of aspirin: Secondary | ICD-10-CM | POA: Insufficient documentation

## 2022-09-14 DIAGNOSIS — N132 Hydronephrosis with renal and ureteral calculous obstruction: Secondary | ICD-10-CM | POA: Diagnosis not present

## 2022-09-14 DIAGNOSIS — Z944 Liver transplant status: Secondary | ICD-10-CM | POA: Insufficient documentation

## 2022-09-14 DIAGNOSIS — R319 Hematuria, unspecified: Secondary | ICD-10-CM

## 2022-09-14 DIAGNOSIS — I8500 Esophageal varices without bleeding: Secondary | ICD-10-CM | POA: Diagnosis not present

## 2022-09-14 DIAGNOSIS — N10 Acute tubulo-interstitial nephritis: Secondary | ICD-10-CM | POA: Diagnosis not present

## 2022-09-14 DIAGNOSIS — I10 Essential (primary) hypertension: Secondary | ICD-10-CM | POA: Diagnosis not present

## 2022-09-14 LAB — CBC
HCT: 33.4 % — ABNORMAL LOW (ref 36.0–46.0)
Hemoglobin: 10 g/dL — ABNORMAL LOW (ref 12.0–15.0)
MCH: 29.9 pg (ref 26.0–34.0)
MCHC: 29.9 g/dL — ABNORMAL LOW (ref 30.0–36.0)
MCV: 99.7 fL (ref 80.0–100.0)
Platelets: 334 10*3/uL (ref 150–400)
RBC: 3.35 MIL/uL — ABNORMAL LOW (ref 3.87–5.11)
RDW: 13.1 % (ref 11.5–15.5)
WBC: 14.7 10*3/uL — ABNORMAL HIGH (ref 4.0–10.5)
nRBC: 0 % (ref 0.0–0.2)

## 2022-09-14 LAB — URINALYSIS, ROUTINE W REFLEX MICROSCOPIC
Bilirubin Urine: NEGATIVE
Glucose, UA: NEGATIVE mg/dL
Ketones, ur: NEGATIVE mg/dL
Nitrite: NEGATIVE
Protein, ur: 100 mg/dL — AB
RBC / HPF: >50 RBC/hpf (ref 0–5)
Specific Gravity, Urine: 1.017 (ref 1.005–1.030)
WBC, UA: >50 WBC/hpf (ref 0–5)
pH: 5 (ref 5.0–8.0)

## 2022-09-14 LAB — BASIC METABOLIC PANEL
Anion gap: 8 (ref 5–15)
BUN: 30 mg/dL — ABNORMAL HIGH (ref 8–23)
CO2: 22 mmol/L (ref 22–32)
Calcium: 8.7 mg/dL — ABNORMAL LOW (ref 8.9–10.3)
Chloride: 107 mmol/L (ref 98–111)
Creatinine, Ser: 3.37 mg/dL — ABNORMAL HIGH (ref 0.44–1.00)
GFR, Estimated: 15 mL/min — ABNORMAL LOW (ref >60)
Glucose, Bld: 108 mg/dL — ABNORMAL HIGH (ref 70–99)
Potassium: 4.6 mmol/L (ref 3.5–5.1)
Sodium: 137 mmol/L (ref 135–145)

## 2022-09-14 MED ORDER — HYDROMORPHONE HCL 1 MG/ML IJ SOLN
0.5000 mg | Freq: Once | INTRAMUSCULAR | Status: AC
  Administered 2022-09-14: 0.5 mg via INTRAVENOUS
  Filled 2022-09-14: qty 0.5

## 2022-09-14 MED ORDER — SODIUM CHLORIDE 0.9 % IV BOLUS
500.0000 mL | Freq: Once | INTRAVENOUS | Status: AC
  Administered 2022-09-14: 500 mL via INTRAVENOUS

## 2022-09-14 MED ORDER — SODIUM CHLORIDE 0.9 % IV SOLN: INTRAVENOUS | Status: DC

## 2022-09-14 MED ORDER — ONDANSETRON HCL 4 MG/2ML IJ SOLN
4.0000 mg | Freq: Once | INTRAMUSCULAR | Status: AC
  Administered 2022-09-14: 4 mg via INTRAVENOUS
  Filled 2022-09-14: qty 2

## 2022-09-14 NOTE — ED Notes (Signed)
Patient transported to CT 

## 2022-09-14 NOTE — ED Triage Notes (Signed)
Pt had renal US today that showed kidney stones.

## 2022-09-14 NOTE — ED Notes (Signed)
MD aware of patient's pain 

## 2022-09-14 NOTE — ED Provider Notes (Addendum)
[place] EMERGENCY DEPARTMENT AT [facility] Provider Note   CSN: [id number] Arrival date & time: 09/14/22  1512     History  Chief Complaint  Patient presents with   Flank Pain    [name] is a 62 y.o. female.  Patient had a CT abdomen pelvis done today looking for kidney stones patient's had a history of several kidney stones in the past.  Is followed by [facility].  Patient's had blood in her urine.  CT scan showed a dilated ureter but did not show any ureter stones.  Multiple stones up in the kidneys.  The CT was ordered by her primary care doctor primary care doctor referred her into the ED for further evaluation.  Patient states that she has had lithotripsy many times in the past.  Past medical history significant for esophageal varices ascites portal vein thrombosis history of hepatitis C Superior mesenteric vein thrombosis right ureteral stone in 2013 gastric ulcer in 2017 type 2 diabetes chronic kidney disease stage IV history of cirrhosis status post liver transplant at [facility] in 2018 status post pancreatic transplant at [facility] in 2018 status post stent to the coronary artery in 2022.  Past surgical history other than mentioned above abdominal hysterectomy gastric bypass lithotripsies gallbladder removal left heart cath in 2022.  Patient is former smoker quit in 1993.  Denies any alcohol use.  Patient states that the onset of the right-sided flank pain was yesterday.  Had a lot of nausea vomiting.  Reminded patient of typical kidney stone for her.  No fevers.  Vital signs significant for temp of 98.8 heart rate 77 blood pressure 141/88 oxygen sats on room air 100%.       Home Medications Prior to Admission medications   Medication Sig Start Date End Date Taking? Authorizing Provider  acetaminophen (TYLENOL) 325 MG tablet Take 2 tablets (650 mg total) by mouth every 4 (four) hours as needed for headache or mild pain. 11/06/20   Leone Brand, NP  aspirin EC  81 MG tablet Take 1 tablet (81 mg total) by mouth daily. Swallow whole. 05/14/20   Jonelle Sidle, MD  atenolol (TENORMIN) 25 MG tablet Take 0.5 tablets (12.5 mg total) by mouth daily. 08/19/22   Jonelle Sidle, MD  buPROPion ER [facility] SR) 100 MG 12 hr tablet Take 100 mg by mouth 2 (two) times daily. 06/14/21   [provider]  clonazePAM (KLONOPIN) 0.5 MG tablet Take 0.5 mg by mouth daily.    [provider]  clopidogrel (PLAVIX) 75 MG tablet TAKE 1 TABLET ONCE DAILY. 07/11/22   Jonelle Sidle, MD  Cyanocobalamin (B-12) 100 MCG TABS Take 1 tablet by mouth daily.    [provider]  cycloSPORINE (SANDIMMUNE) 100 MG capsule Take 100 mg by mouth daily.    [provider]  cycloSPORINE (SANDIMMUNE) 25 MG capsule Take 75 mg by mouth at bedtime.    [provider]  ezetimibe (ZETIA) 10 MG tablet take 1 tablet once daily. 06/13/22   Jonelle Sidle, MD  Multiple Vitamins-Minerals (SUPER THERA VITE M PO) Take 1 tablet by mouth daily.    [provider]  mycophenolate (CELLCEPT) 250 MG capsule Take 250-500 mg by mouth See admin instructions. Take 500 mg in the morning. 250 mg at bedtime    [provider]  nitroGLYCERIN (NITROSTAT) 0.4 MG SL tablet Place 1 tablet (0.4 mg total) under the tongue every 5 (five) minutes as needed. 06/11/20  Jonelle Sidle, MD  Oxycodone HCl 10 MG TABS Take 10 mg by mouth every 6 (six) hours as needed (pain).    [provider]  pantoprazole (PROTONIX) 40 MG tablet Take 40 mg by mouth at bedtime. 09/17/20   [provider]  phentermine (ADIPEX-P) 37.5 MG tablet Take 37.5 mg by mouth daily. 06/04/21   [provider]  pravastatin (PRAVACHOL) 20 MG tablet Take 1 tablet (20 mg total) by mouth daily. 07/20/22   Jonelle Sidle, MD  TRINTELLIX 10 MG TABS tablet Take 10 mg by mouth daily. 06/14/21   [provider]      Allergies    Ancef [cefazolin sodium], Tapentadol,  Cefazolin, Gadobenate, Iodinated contrast media, and Tape    Review of Systems   Review of Systems  Constitutional:  Negative for chills and fever.  HENT:  Negative for ear pain and sore throat.   Eyes:  Negative for pain and visual disturbance.  Respiratory:  Negative for cough and shortness of breath.   Cardiovascular:  Negative for chest pain and palpitations.  Gastrointestinal:  Negative for abdominal pain and vomiting.  Genitourinary:  Positive for dysuria, flank pain and hematuria.  Musculoskeletal:  Negative for arthralgias and back pain.  Skin:  Negative for color change and rash.  Neurological:  Negative for seizures and syncope.  All other systems reviewed and are negative.   Physical Exam Updated Vital Signs BP 138/85   Pulse 79   Temp 98.8 F (37.1 C)   Resp 20   Ht 1.651 m (5\' 5" )   Wt 88.9 kg   SpO2 98%   BMI 32.62 kg/m  Physical Exam Vitals and nursing note reviewed.  Constitutional:      General: She is not in acute distress.    Appearance: Normal appearance. She is well-developed.  HENT:     Head: Normocephalic and atraumatic.  Eyes:     Extraocular Movements: Extraocular movements intact.     Conjunctiva/sclera: Conjunctivae normal.     Pupils: Pupils are equal, round, and reactive to light.  Cardiovascular:     Rate and Rhythm: Normal rate and regular rhythm.     Heart sounds: No murmur heard. Pulmonary:     Effort: Pulmonary effort is normal. No respiratory distress.     Breath sounds: Normal breath sounds.  Abdominal:     Palpations: Abdomen is soft.     Tenderness: There is no abdominal tenderness.  Musculoskeletal:        General: No swelling.     Cervical back: Normal range of motion and neck supple.  Skin:    General: Skin is warm and dry.     Capillary Refill: Capillary refill takes less than 2 seconds.  Neurological:     General: No focal deficit present.     Mental Status: She is alert.  Psychiatric:        Mood and Affect: Mood  normal.     ED Results / Procedures / Treatments   Labs (all labs ordered are listed, but only abnormal results are displayed) Labs Reviewed  URINALYSIS, ROUTINE W REFLEX MICROSCOPIC - Abnormal; Notable for the following components:      Result Value   APPearance TURBID (*)    Hgb urine dipstick LARGE (*)    Protein, ur 100 (*)    Leukocytes,Ua MODERATE (*)    Bacteria, UA RARE (*)    All other components within normal limits  BASIC METABOLIC PANEL - Abnormal; Notable for  the following components:   Glucose, Bld 108 (*)    BUN 30 (*)    Creatinine, Ser 3.37 (*)    Calcium 8.7 (*)    GFR, Estimated 15 (*)    All other components within normal limits  CBC - Abnormal; Notable for the following components:   WBC 14.7 (*)    RBC 3.35 (*)    Hemoglobin 10.0 (*)    HCT 33.4 (*)    MCHC 29.9 (*)    All other components within normal limits    EKG None  Radiology CT Renal Stone Study  Result Date: 09/14/2022 CLINICAL DATA:  Abdominal/flank pain. EXAM: CT ABDOMEN AND PELVIS WITHOUT CONTRAST TECHNIQUE: Multidetector CT imaging of the abdomen and pelvis was performed following the standard protocol without IV contrast. RADIATION DOSE REDUCTION: This exam was performed according to the departmental dose-optimization program which includes automated exposure control, adjustment of the mA and/or kV according to patient size and/or use of iterative reconstruction technique. COMPARISON:  CT earlier today FINDINGS: Lower chest: No acute abnormality Hepatobiliary: No focal hepatic abnormality. Gallbladder unremarkable. Pancreas: No focal abnormality or ductal dilatation. Spleen: Prior splenectomy. Accessory spleen in the left upper quadrant, unchanged. Adrenals/Urinary Tract: Adrenal glands normal. Moderate to severe right hydronephrosis again noted. There is mild inflammatory stranding noted around the mid right ureter. Below this level, the ureters decompressed. No visible ureteral stones. This  may be related to retroperitoneal fibrosis although focal urothelial lesion cannot be completely excluded. Bilateral nephrolithiasis. No hydronephrosis on the left. Urinary bladder unremarkable. Stomach/Bowel: Changes of right hemicolectomy. Left colonic diverticulosis. No active diverticulitis. No bowel obstruction. Vascular/Lymphatic: Aortoiliac atherosclerosis that aortic atherosclerosis. No evidence of aneurysm or adenopathy. Reproductive: Prior hysterectomy.  No adnexal masses. Other: Trace perihepatic ascites.  No free air. Musculoskeletal: No acute bony abnormality. IMPRESSION: Moderate to severe right hydronephrosis is again noted and stable since study earlier today. There are bilateral nonobstructing renal stones. No visible ureteral stones. The right ureter is decompressed beyond the mid right ureter where there is mild surrounding inflammation in the retroperitoneum. 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 Obstructing urothelial lesion within the ureter cannot be completely excluded. Consider urologic consultation. Left colonic diverticulosis.  No active diverticulitis. Aortic atherosclerosis. Trace perihepatic ascites. Electronically Signed   By: Charlett Nose M.D.   On: 09/14/2022 23:24   CT ABDOMEN PELVIS WO CONTRAST  Result Date: 09/14/2022 CLINICAL DATA:  Bilateral flank pain. Personal history of nephrolithiasis. EXAM: CT ABDOMEN AND PELVIS WITHOUT CONTRAST TECHNIQUE: Multidetector CT imaging of the abdomen and pelvis was performed following the standard protocol without IV contrast. RADIATION DOSE REDUCTION: This exam was performed according to the departmental dose-optimization program which includes automated exposure control, adjustment of the mA and/or kV according to patient size and/or use of iterative reconstruction technique. COMPARISON:  CT of the abdomen and pelvis without contrast 11/05/2020 FINDINGS: Lower chest: The lung bases are clear without focal nodule, mass, or airspace  disease. The heart size is normal. No significant pleural or pericardial effusion is present. Hepatobiliary: No focal liver abnormality is seen. Status post cholecystectomy. No biliary dilatation. Pancreas: Unremarkable. No pancreatic ductal dilatation or surrounding inflammatory changes. 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 Adrenals/Urinary Tract: The adrenal glands are normal bilaterally. 3 6 mm nonobstructing stone present at the lower pole of the left kidney. No other significant left-sided stones are present. The left ureter is within normal limits. Moderate to severe right-sided hydronephrosis is present. Multiple nonobstructing  stones are present. A 6 mm stone is present at the lower pole of the right kidney. 11 mm nonobstructing stone is present at the upper pole of the right kidney multiple other smaller stones are present. 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 More distal right ureter is within normal limits. No obstructing stones are present. Stomach/Bowel: The stomach and duodenum are within normal limits. Portions the small bowel are adherent to the peritoneum obstruction is present. Right hemicolectomy is noted. The ileocolonic anastomosis is intact. Diverticular changes are present in the descending and sigmoid colon without focal inflammation to suggest diverticulitis. 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 Anterior aortic graft extending to the liver is stable. Mild atherosclerotic calcifications are present in the aorta branch vessels without aneurysm. Reproductive: Status post hysterectomy. No adnexal masses. Other: No abdominal wall hernia or abnormality. No abdominopelvic ascites. Musculoskeletal: Multilevel degenerative changes are present in the lumbar spine. Facet hypertrophy is greatest at L4-5 and L5-S1.  Vertebral body heights are normal. No significant listhesis is present. No focal osseous lesions are present. IMPRESSION: 1. 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 2. Multiple nonobstructing stones in both kidneys, right greater than left. 3. 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 4. Right hemicolectomy with ileocolonic anastomosis. 5. Descending and sigmoid diverticulosis without diverticulitis. 6. Multilevel degenerative changes in the lumbar spine. 7.  Aortic Atherosclerosis (ICD10-I70.0). Electronically Signed   By: Marin Roberts M.D.   On: 09/14/2022 14:48    Procedures Procedures    Medications Ordered in ED Medications  0.9 %  sodium chloride infusion ( Intravenous New Bag/Given 09/14/22 2326)  sodium chloride 0.9 % bolus 500 mL (500 mLs Intravenous New Bag/Given 09/14/22 2323)  HYDROmorphone (DILAUDID) injection 0.5 mg (0.5 mg Intravenous Given 09/14/22 2325)  ondansetron (ZOFRAN) injection 4 mg (4 mg Intravenous Given 09/14/22 2323)    ED Course/ Medical Decision Making/ A&P                                 Medical Decision Making Amount and/or Complexity of Data Reviewed Labs: ordered. Radiology: ordered.  Risk Prescription drug management.   Patient has labs here today basic metabolic panel GFR 15 little bit worse than usual for her.  Creatinine 3.37.  Otherwise electrolytes normal.  White blood cell count 14.7 hemoglobin 10 platelets 333 urinalysis had RBCs greater than 50 white blood cells greater than 50 bacteria was rare.  Nitrite negative.   Was unusual that the with the dilated ureter they could not see your stone.  Seem to be hydronephrosis.  Will repeat CT renal study to see what we get here tonight.  Patient will be treated with some IV fluids and treated with IV pain medicine and  Zofran.  Repeat CT scan shows moderate to severe right hydronephrosis again noted stable since study earlier today.  So not any worse.  There are bilateral nonobstructing renal stones no visible ureter stones.  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  They recommended considering urological consultation.   I suspect that this is most likely a kidney  stone.  If it clinically act like a stone.  Just not visualized.  Patient does not act like she had a urinary tract infection.  The urine has rare bacteria but does have white cells and red blood cells.  Will send as a culture.  Discussed with Dr. Laverle Patter on-call for urology.  He says normally his stone will be visible.  He is concerned that this could be a clot in the ureter.  Could be due to fibrosis could be due to even something is bleeding up in the kidney like a neoplastic process.  But he said just treat her like it is a stone have her call [facility] who she is already followed by in the morning to set up follow-up appointment.  Will give patient prepack of hydrocodone to go home.  She will call urology in the morning for follow-up.  Patient actually has a 1 month supply of oxycodone at home.  She will not be able to prescribe any further narcotics.  Final Clinical Impression(s) / ED Diagnoses Final diagnoses:  Flank pain  Hematuria, unspecified type    Rx / DC Orders ED Discharge Orders     None         Vanetta Mulders, MD 09/14/22 2202    Vanetta Mulders, MD 09/14/22 [id number]    Vanetta Mulders, MD 09/14/22 0623    Vanetta Mulders, MD 09/15/22 0001    Vanetta Mulders, MD 09/15/22 0005

## 2022-09-15 NOTE — Discharge Instructions (Signed)
The oxycodone that you have at home to help with the pain.  Call [facility] in the morning for follow-up.  Return for any new or worse symptoms.  Urine not classic for urinary tract infection but urine culture sent.

## 2022-09-16 ENCOUNTER — Ambulatory Visit (HOSPITAL_COMMUNITY)
Admission: AD | Admit: 2022-09-16 | Discharge: 2022-09-16 | Disposition: A | Discharge status: Home / Self Care | Payer: Medicare Other | Source: Ambulatory Visit | Attending: Urology | Admitting: Urology

## 2022-09-16 ENCOUNTER — Ambulatory Visit (HOSPITAL_COMMUNITY): Payer: Medicare Other | Admitting: Anesthesiology

## 2022-09-16 ENCOUNTER — Other Ambulatory Visit: Payer: Self-pay | Admitting: Urology

## 2022-09-16 ENCOUNTER — Ambulatory Visit (HOSPITAL_COMMUNITY): Payer: Medicare Other

## 2022-09-16 ENCOUNTER — Encounter (HOSPITAL_COMMUNITY)
Admission: AD | Disposition: A | Discharge status: Home / Self Care | Payer: Self-pay | Source: Ambulatory Visit | Attending: Urology

## 2022-09-16 ENCOUNTER — Encounter (HOSPITAL_COMMUNITY): Payer: Self-pay | Admitting: Urology

## 2022-09-16 ENCOUNTER — Ambulatory Visit (HOSPITAL_BASED_OUTPATIENT_CLINIC_OR_DEPARTMENT_OTHER): Payer: Medicare Other | Admitting: Anesthesiology

## 2022-09-16 ENCOUNTER — Other Ambulatory Visit: Payer: Self-pay

## 2022-09-16 DIAGNOSIS — Z87891 Personal history of nicotine dependence: Secondary | ICD-10-CM | POA: Diagnosis not present

## 2022-09-16 DIAGNOSIS — E1122 Type 2 diabetes mellitus with diabetic chronic kidney disease: Secondary | ICD-10-CM | POA: Insufficient documentation

## 2022-09-16 DIAGNOSIS — Z944 Liver transplant status: Secondary | ICD-10-CM | POA: Diagnosis not present

## 2022-09-16 DIAGNOSIS — Z955 Presence of coronary angioplasty implant and graft: Secondary | ICD-10-CM | POA: Diagnosis not present

## 2022-09-16 DIAGNOSIS — N1330 Unspecified hydronephrosis: Secondary | ICD-10-CM

## 2022-09-16 DIAGNOSIS — I2510 Atherosclerotic heart disease of native coronary artery without angina pectoris: Secondary | ICD-10-CM | POA: Diagnosis not present

## 2022-09-16 DIAGNOSIS — E1165 Type 2 diabetes mellitus with hyperglycemia: Secondary | ICD-10-CM | POA: Diagnosis not present

## 2022-09-16 DIAGNOSIS — I25119 Atherosclerotic heart disease of native coronary artery with unspecified angina pectoris: Secondary | ICD-10-CM | POA: Diagnosis not present

## 2022-09-16 DIAGNOSIS — Z951 Presence of aortocoronary bypass graft: Secondary | ICD-10-CM | POA: Insufficient documentation

## 2022-09-16 DIAGNOSIS — R1084 Generalized abdominal pain: Secondary | ICD-10-CM | POA: Diagnosis not present

## 2022-09-16 DIAGNOSIS — F418 Other specified anxiety disorders: Secondary | ICD-10-CM | POA: Diagnosis not present

## 2022-09-16 DIAGNOSIS — N189 Chronic kidney disease, unspecified: Secondary | ICD-10-CM | POA: Diagnosis not present

## 2022-09-16 DIAGNOSIS — G4730 Sleep apnea, unspecified: Secondary | ICD-10-CM | POA: Diagnosis not present

## 2022-09-16 DIAGNOSIS — Z9483 Pancreas transplant status: Secondary | ICD-10-CM | POA: Insufficient documentation

## 2022-09-16 DIAGNOSIS — Z8711 Personal history of peptic ulcer disease: Secondary | ICD-10-CM | POA: Insufficient documentation

## 2022-09-16 DIAGNOSIS — I252 Old myocardial infarction: Secondary | ICD-10-CM | POA: Insufficient documentation

## 2022-09-16 DIAGNOSIS — Z9482 Intestine transplant status: Secondary | ICD-10-CM | POA: Insufficient documentation

## 2022-09-16 DIAGNOSIS — N130 Hydronephrosis with ureteropelvic junction obstruction: Secondary | ICD-10-CM | POA: Diagnosis not present

## 2022-09-16 DIAGNOSIS — Z9489 Other transplanted organ and tissue status: Secondary | ICD-10-CM | POA: Diagnosis not present

## 2022-09-16 DIAGNOSIS — N200 Calculus of kidney: Secondary | ICD-10-CM | POA: Diagnosis not present

## 2022-09-16 HISTORY — DX: Atherosclerotic heart disease of native coronary artery without angina pectoris: I25.10

## 2022-09-16 HISTORY — PX: CYSTOSCOPY WITH RETROGRADE PYELOGRAM, URETEROSCOPY AND STENT PLACEMENT: SHX5789

## 2022-09-16 HISTORY — DX: Acute myocardial infarction, unspecified: I21.9

## 2022-09-16 HISTORY — DX: Sleep apnea, unspecified: G47.30

## 2022-09-16 LAB — GLUCOSE, CAPILLARY: Glucose-Capillary: 99 mg/dL (ref 70–99)

## 2022-09-16 SURGERY — CYSTOURETEROSCOPY, WITH RETROGRADE PYELOGRAM AND STENT INSERTION
Anesthesia: General | Site: Ureter | Laterality: Right

## 2022-09-16 MED ORDER — PROMETHAZINE HCL 25 MG/ML IJ SOLN: 6.2500 mg | INTRAMUSCULAR | Status: DC | PRN

## 2022-09-16 MED ORDER — OXYCODONE HCL 5 MG PO TABS: 5.0000 mg | ORAL_TABLET | Freq: Once | ORAL | Status: AC | PRN

## 2022-09-16 MED ORDER — FENTANYL CITRATE (PF) 100 MCG/2ML IJ SOLN
INTRAMUSCULAR | Status: DC | PRN
  Administered 2022-09-16: 50 ug via INTRAVENOUS

## 2022-09-16 MED ORDER — SODIUM CHLORIDE 0.9 % IR SOLN
Status: DC | PRN
  Administered 2022-09-16: 3000 mL

## 2022-09-16 MED ORDER — CHLORHEXIDINE GLUCONATE 0.12 % MT SOLN
15.0000 mL | Freq: Once | OROMUCOSAL | Status: AC
  Administered 2022-09-16: 15 mL via OROMUCOSAL

## 2022-09-16 MED ORDER — SUCCINYLCHOLINE CHLORIDE 200 MG/10ML IV SOSY
PREFILLED_SYRINGE | INTRAVENOUS | Status: DC | PRN
  Administered 2022-09-16: 100 mg via INTRAVENOUS

## 2022-09-16 MED ORDER — HYDROMORPHONE HCL 1 MG/ML IJ SOLN
INTRAMUSCULAR | Status: AC
  Filled 2022-09-16: qty 1

## 2022-09-16 MED ORDER — DEXAMETHASONE SODIUM PHOSPHATE 10 MG/ML IJ SOLN
INTRAMUSCULAR | Status: AC
  Filled 2022-09-16: qty 1

## 2022-09-16 MED ORDER — 0.9 % SODIUM CHLORIDE (POUR BTL) OPTIME
TOPICAL | Status: DC | PRN
  Administered 2022-09-16: 1000 mL

## 2022-09-16 MED ORDER — LIDOCAINE HCL (PF) 2 % IJ SOLN
INTRAMUSCULAR | Status: AC
  Filled 2022-09-16: qty 5

## 2022-09-16 MED ORDER — MIDAZOLAM HCL 2 MG/2ML IJ SOLN
INTRAMUSCULAR | Status: DC | PRN
  Administered 2022-09-16: 2 mg via INTRAVENOUS

## 2022-09-16 MED ORDER — PROPOFOL 10 MG/ML IV BOLUS
INTRAVENOUS | Status: AC
  Filled 2022-09-16: qty 20

## 2022-09-16 MED ORDER — CIPROFLOXACIN IN D5W 400 MG/200ML IV SOLN
INTRAVENOUS | Status: AC
  Filled 2022-09-16: qty 200

## 2022-09-16 MED ORDER — FENTANYL CITRATE (PF) 100 MCG/2ML IJ SOLN
INTRAMUSCULAR | Status: AC
  Filled 2022-09-16: qty 2

## 2022-09-16 MED ORDER — OXYCODONE HCL 5 MG/5ML PO SOLN: 5.0000 mg | Freq: Once | ORAL | Status: AC | PRN

## 2022-09-16 MED ORDER — PROPOFOL 10 MG/ML IV BOLUS
INTRAVENOUS | Status: DC | PRN
  Administered 2022-09-16: 140 mg via INTRAVENOUS

## 2022-09-16 MED ORDER — LIDOCAINE 2% (20 MG/ML) 5 ML SYRINGE
INTRAMUSCULAR | Status: DC | PRN
  Administered 2022-09-16: 80 mg via INTRAVENOUS

## 2022-09-16 MED ORDER — HYDROMORPHONE HCL 1 MG/ML IJ SOLN
0.2500 mg | INTRAMUSCULAR | Status: DC | PRN
  Administered 2022-09-16: 0.25 mg via INTRAVENOUS

## 2022-09-16 MED ORDER — ONDANSETRON HCL 4 MG/2ML IJ SOLN
INTRAMUSCULAR | Status: AC
  Filled 2022-09-16: qty 2

## 2022-09-16 MED ORDER — IOHEXOL 300 MG/ML  SOLN
INTRAMUSCULAR | Status: DC | PRN
  Administered 2022-09-16: 10 mL

## 2022-09-16 MED ORDER — MIDAZOLAM HCL 2 MG/2ML IJ SOLN
INTRAMUSCULAR | Status: AC
  Filled 2022-09-16: qty 2

## 2022-09-16 MED ORDER — TAMSULOSIN HCL 0.4 MG PO CAPS: 0.4000 mg | ORAL_CAPSULE | Freq: Every day | ORAL | 0 refills | Status: DC

## 2022-09-16 MED ORDER — LACTATED RINGERS IV SOLN: INTRAVENOUS | Status: DC

## 2022-09-16 MED ORDER — ORAL CARE MOUTH RINSE: 15.0000 mL | Freq: Once | OROMUCOSAL | Status: AC

## 2022-09-16 MED ORDER — OXYCODONE HCL 5 MG PO TABS
ORAL_TABLET | ORAL | Status: AC
  Administered 2022-09-16: 5 mg via ORAL
  Filled 2022-09-16: qty 1

## 2022-09-16 MED ORDER — LIDOCAINE HCL URETHRAL/MUCOSAL 2 % EX GEL
CUTANEOUS | Status: AC
  Filled 2022-09-16: qty 5

## 2022-09-16 MED ORDER — LIDOCAINE HCL URETHRAL/MUCOSAL 2 % EX GEL
CUTANEOUS | Status: AC
  Filled 2022-09-16: qty 30

## 2022-09-16 MED ORDER — CIPROFLOXACIN IN D5W 400 MG/200ML IV SOLN
400.0000 mg | INTRAVENOUS | Status: AC
  Administered 2022-09-16: 400 mg via INTRAVENOUS

## 2022-09-16 SURGICAL SUPPLY — 21 items
BAG URO CATCHER STRL LF (MISCELLANEOUS) ×2 IMPLANT
BASKET ZERO TIP NITINOL 2.4FR (BASKET) IMPLANT
BSKT STON RTRVL ZERO TP 2.4FR (BASKET)
CATH URETL OPEN 5X70 (CATHETERS) ×2 IMPLANT
CLOTH BEACON ORANGE TIMEOUT ST (SAFETY) ×2 IMPLANT
EXTRACTOR STONE 1.7FRX115CM (UROLOGICAL SUPPLIES) IMPLANT
GLOVE SURG LX STRL 7.5 STRW (GLOVE) ×2 IMPLANT
GOWN STRL REUS W/ TWL XL LVL3 (GOWN DISPOSABLE) ×2 IMPLANT
GOWN STRL REUS W/TWL XL LVL3 (GOWN DISPOSABLE) ×1
GUIDEWIRE ANG ZIPWIRE 038X150 (WIRE) IMPLANT
GUIDEWIRE STR DUAL SENSOR (WIRE) ×2 IMPLANT
KIT TURNOVER KIT A (KITS) IMPLANT
LASER FIB FLEXIVA PULSE ID 365 (Laser) IMPLANT
MANIFOLD NEPTUNE II (INSTRUMENTS) ×2 IMPLANT
PACK CYSTO (CUSTOM PROCEDURE TRAY) ×2 IMPLANT
SHEATH NAVIGATOR HD 12/14X36 (SHEATH) IMPLANT
STENT URET 6FRX24 CONTOUR (STENTS) IMPLANT
TRACTIP FLEXIVA PULS ID 200XHI (Laser) IMPLANT
TRACTIP FLEXIVA PULSE ID 200 (Laser)
TUBING CONNECTING 10 (TUBING) ×2 IMPLANT
TUBING UROLOGY SET (TUBING) ×2 IMPLANT

## 2022-09-16 NOTE — H&P (Addendum)
H and P    - Recurrent Urolithiasis  Pre 2024 SWL x several (all pre liver transplant)   Recent Surveillance:  05/2022 - KUB - Rt upper 8mm, Rt lower 6mm stones (stable x years)   2 - Cystitis - new irritative voiding, dysuria, even transient hematuira x few days 2024. UA c/w cystitis. NO fevers. She is immune supressed, KUB w/o overt obstructing stone.   PMH sig for liver failure/ transplant (follows [facility]), gastric bypass, CAD/Stent/Plavix (follows [facility] Dr. Diona Browner) her PCP is Dr. Sherwood Gambler.   Interval: Today the patient is here as an urgent work in. Over the last several days she has had gross hematuria and right-sided renal colic. She had a CT scan performed in the emergency department 2 days ago [facility]) and was noted to have what appears to be clot within the right renal pelvis with associated hydro and large stone burden. There were no stones within the ureter.   The patient's pain is quite significant today. She is having associated nausea and vomiting. She is also having voiding symptoms including dysuria. She is not having fevers or chills. She has had no hemodynamic instability       ALLERGIES:  Adhesive Tape TAPE Ancef Ancef SOLR Cefazolin CeFAZolin Sodium SOLN Iodinated Contrast Media MultiHance SOLN Tapentadol    MEDICATIONS: Plavix 75 mg tablet  Aspirin Ec 81 mg tablet, delayed release  Atenolol 25 mg tablet  Nitroglycerin 0.4 mg tablet, sublingual  Oxycodone Hcl 10 mg tablet  Pantoprazole Sodium 40 mg tablet, delayed release  Phentermine Hcl 37.5 mg capsule  Pravastatin Sodium 20 mg tablet  Sandimmune 25 mg capsule  Trintellix 10 mg tablet  Tylenol 325 mg capsule  Vitamin B12  Zetia 10 mg tablet     GU PSH: Hysterectomy Unilat SO - 2008       PSH Notes: Gastric Surgery, Hysterectomy, Hernia Repair, Cesarean Section, Cholecystectomy   NON-GU PSH: Cesarean Delivery Only - 2008 Cholecystectomy (open) - 2008 Hernia Repair -  2008     GU PMH: Acute Cystitis/UTI - 05/31/2022 Renal and ureteral calculus - 05/31/2022      PMH Notes:  2006-10-27 08:43:28 - Note: Chronic Liver Disease   NON-GU PMH: No Non-GU PMH    FAMILY HISTORY: Death of family member - Runs In Family Diabetes - Father Heart Disease - Father Hypertension - Mother liver disease - Runs In Family nephrolithiasis - Mother   SOCIAL HISTORY: Marital Status: Single Preferred Language: English; Race: White Does not use smokeless tobacco. Does not use drugs. Has not had a blood transfusion.     Notes: Caffeine Use, Single, Never a smoker, Alcohol Use, Occupation:   REVIEW OF SYSTEMS:    GU Review Female:   Patient denies frequent urination, hard to postpone urination, burning /pain with urination, get up at night to urinate, leakage of urine, stream starts and stops, trouble starting your stream, have to strain to urinate, and being pregnant.  Gastrointestinal (Upper):   Patient reports nausea and vomiting. Patient denies indigestion/ heartburn.  Gastrointestinal (Lower):   Patient denies diarrhea and constipation.  Constitutional:   Patient denies fever, night sweats, weight loss, and fatigue.  Skin:   Patient denies skin rash/ lesion and itching.  Eyes:   Patient denies blurred vision and double vision.  Ears/ Nose/ Throat:   Patient denies sinus problems and sore throat.  Hematologic/Lymphatic:   Patient denies swollen glands and easy bruising.  Cardiovascular:   Patient denies leg swelling and  chest pains.  Respiratory:   Patient denies cough and shortness of breath.  Endocrine:   Patient denies excessive thirst.  Musculoskeletal:   Patient reports back pain. Patient denies joint pain.  Neurological:   Patient denies headaches and dizziness.  Psychologic:   Patient denies depression and anxiety.   VITAL SIGNS:      09/16/2022 10:46 AM  Weight 198 lb / 89.81 kg  BP 140/83 mmHg  Pulse 65 /min  Temperature 97.8 F / 36.5 C    Complexity of Data:  Source Of History:  Patient  Lab Test Review:   PSA  Records Review:   Pathology Reports, Previous Doctor Records, Previous Patient Records, POC Tool  Urine Test Review:   Urinalysis  X-Ray Review: C.T. Abdomen/Pelvis: Reviewed Films. Discussed With Patient.     PROCEDURES:          Visit Complexity - G2211          Urinalysis w/Scope Dipstick Dipstick Cont'd Micro  Color: Brown Bilirubin: Neg mg/dL WBC/hpf: 10 - 42/HCW  Appearance: Cloudy Ketones: Trace mg/dL RBC/hpf: >23/JSE  Specific Gravity: 1.025 Blood: 3+ ery/uL Bacteria: Many (>50/hpf)  pH: <=5.0 Protein: 3+ mg/dL Cystals: NS (Not Seen)  Glucose: Neg mg/dL Urobilinogen: 0.2 mg/dL Casts: NS (Not Seen)    Nitrites: Positive Trichomonas: Not Present    Leukocyte Esterase: 2+ leu/uL Mucous: Not Present      Epithelial Cells: 0 - 5/hpf      Yeast: NS (Not Seen)      Sperm: Not Present    Notes: too turbid to spin          Ceftriaxone 1g - G3151, 76160 0 wastage   Qty: 1 Adm. By: Zettie Pho  Unit: gram Lot No [id number]  Route: IM Exp. Date 10/08/2024  Freq: None Mfgr.: Hospira   Site: Right Buttock         Morphine 8mg  - [id number], [id number] zero wasted   Qty: 8 Adm. By: Lissa Hoard McDougald  Unit: mg Lot No [id number]  Route: IM Exp. Date 09/08/2023  Freq: None Mfgr.:   Site: Left Buttock         Phenergan 25mg  - [id number], [id number] 0 wastage    Qty: 25 Adm. By: Zettie Pho  Unit: mg Lot No [id number]  Route: IM Exp. Date 03/11/2023  Freq: None Mfgr.: Chad Ward   Site: Right Buttock   ASSESSMENT:      ICD-10 Details  1 GU:   Renal calculus - N20.0   2   Hydronephrosis - N13.0      PLAN:            Medications New Meds: Cipro 250 mg tablet 1 tablet PO BID   #14  0 Refill(s)  Pharmacy Name:  [facility]  Address:  [address]   [place], [place] [address]  Phone:  [phone]  Fax:  [phone]            Orders Labs Urine Culture          Document Letter(s):  Created  for Patient: Clinical Summary         Notes:   The patient has what appears to be clot colic from her right kidney. The etiology of her bleeding is unclear, may be just the fact that she is anticoagulated and has large stones in that kidney and concomitant dehydration. However, she does need a formal evaluation.   Fortunately, the patient has not had any fevers today.  She does have evidence of a UTI. Her pain is quite significant.   Given that she does have evidence of infection and is obstructed I recommended that we take the patient urgently to the operating room for stent. Will place a stent and then put her on antibiotics. Will then have her follow-up with Dr. Berneice Heinrich to discuss formal hematuria evaluation and treatment of her kidney stones.

## 2022-09-16 NOTE — Anesthesia Procedure Notes (Signed)
Procedure Name: Intubation Date/Time: 09/16/2022 4:34 PM  Performed by: Florene Route, CRNAPre-anesthesia Checklist: Patient identified, Emergency Drugs available, Suction available and Patient being monitored Patient Re-evaluated:Patient Re-evaluated prior to induction Oxygen Delivery Method: Circle system utilized Preoxygenation: Pre-oxygenation with 100% oxygen Induction Type: IV induction, Cricoid Pressure applied and Rapid sequence Laryngoscope Size: Miller and 2 Grade View: Grade I Tube type: Oral Tube size: 7.5 mm Number of attempts: 1 Airway Equipment and Method: Stylet and Oral airway Placement Confirmation: ETT inserted through vocal cords under direct vision, positive ETCO2 and breath sounds checked- equal and bilateral Secured at: 21 cm Tube secured with: Tape Dental Injury: Teeth and Oropharynx as per pre-operative assessment

## 2022-09-16 NOTE — Interval H&P Note (Signed)
History and Physical Interval Note:  09/16/2022 4:11 PM  [name]  has presented today for surgery, with the diagnosis of HYDRONEPHROSIS.  The various methods of treatment have been discussed with the patient and family. After consideration of risks, benefits and other options for treatment, the patient has consented to  Procedure(s): CYSTOSCOPY WITH RETROGRADE PYELOGRAM AND RIGHT STENT PLACEMENT (Right) as a surgical intervention.  The patient's history has been reviewed, patient examined, no change in status, stable for surgery.  I have reviewed the patient's chart and labs.  Questions were answered to the patient's satisfaction.     Crist Fat

## 2022-09-16 NOTE — Op Note (Cosign Needed)
Preoperative diagnosis:  Right hydronephrosis    Postoperative diagnosis:  Same    Procedure:  Cystoscopy right ureteral stent placement right retrograde pyelography with interpretation   Surgeon: Crist Fat, MD  Assistant: Jerald Kief, MD, PhD  Anesthesia: General  Complications: None  Intraoperative findings:  -bladder with debris, c/f UTI -No masses or lesion in the bladder on visual inspection -Normal urethra  - right retrograde no filling defect or contrast extravasation. No significant hydronephrosis in the right system. Successful stent placement 6Fr x 24 JJ  EBL: Minimal  Specimens: None  Indication: [name] is a 62 y.o. patient with c/f clot colic in the right kidney. After reviewing the management options for treatment, he elected to proceed with the above surgical procedure(s). We have discussed the potential benefits and risks of the procedure, side effects of the proposed treatment, the likelihood of the patient achieving the goals of the procedure, and any potential problems that might occur during the procedure or recuperation. Informed consent has been obtained.  Description of procedure:  The patient was taken to the operating room and general anesthesia was induced.  The patient was placed in the dorsal lithotomy position, prepped and draped in the usual sterile fashion, and preoperative antibiotics were administered. A preoperative time-out was performed.   Cystourethroscopy was performed with the finding above.  Attention then turned to the rightureteral orifice and a ureteral catheter was used to intubate the ureteral orifice.  Omnipaque contrast was injected through the ureteral catheter and a retrograde pyelogram was performed with findings as dictated above.  A 0.38 sensor guidewire was then advanced up the right ureter into the renal pelvis under fluoroscopic guidance.  The wire was then backloaded through the cystoscope and a  ureteral stent was advance over the wire using Seldinger technique.  The stent was positioned appropriately under fluoroscopic and cystoscopic guidance.  The wire was then removed with an adequate stent curl noted in the renal pelvis as well as in the bladder.  The bladder was then emptied and the procedure ended.  The patient appeared to tolerate the procedure well and without complications.  The patient was able to be awakened and transferred to the recovery unit in satisfactory condition.   Jerald Kief, MD, PhD [facility] Resident  PGY4 [facility]    Crist Fat, M.D.

## 2022-09-16 NOTE — Anesthesia Preprocedure Evaluation (Addendum)
Anesthesia Evaluation  Patient identified by MRN, date of birth, ID band Patient awake    Reviewed: Allergy & Precautions, NPO status , Patient's Chart, lab work & pertinent test results  Airway Mallampati: II  TM Distance: >3 FB Neck ROM: Full    Dental  (+) Dental Advisory Given, Teeth Intact   Pulmonary sleep apnea , former smoker   Pulmonary exam normal breath sounds clear to auscultation       Cardiovascular + angina  + CAD, + Past MI, + Cardiac Stents and + CABG  Normal cardiovascular exam+ Valvular Problems/Murmurs  Rhythm:Regular Rate:Normal  Cath 10/2020   Mid LAD lesion is 100% stenosed.   A drug-eluting stent was successfully placed using a SYNERGY XD [id number].   Post intervention, there is a 0% residual stenosis.   1. Successful CTO PCI of the mid LAD with IVUS guidance and DES x 1   Echo 05/2020  1. Left ventricular ejection fraction, by estimation, is 60 to 65%. The left ventricle has normal function. The left ventricle has no regional wall motion abnormalities. There is mild left ventricular hypertrophy. Left ventricular diastolic parameters were normal. Normal global longitudinal strain of -19.1%.  2. Right ventricular systolic function is normal. The right ventricular size is normal. There is normal pulmonary artery systolic pressure. The estimated right ventricular systolic pressure is 29.0 mmHg.   3. Left atrial size was mildly dilated.   4. The mitral valve is grossly normal. Mild mitral valve regurgitation.   5. The aortic valve is tricuspid. Aortic valve regurgitation is not visualized.   6. The inferior vena cava is normal in size with greater than 50% respiratory variability, suggesting right atrial pressure of 3 mmHg.   Comparison(s): Echocardiogram done 02/27/17 ay [facility] showed an EF of 56%.     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    Endo/Other  diabetes    Renal/GU CRF and Renal InsufficiencyRenal disease     Musculoskeletal negative musculoskeletal ROS (+)    Abdominal  (+) + obese  Peds  Hematology  (+) Blood dyscrasia, anemia   Anesthesia Other Findings   Reproductive/Obstetrics                              Anesthesia Physical Anesthesia Plan  ASA: 3  Anesthesia Plan: General   Post-op Pain Management:    Induction: Intravenous  PONV Risk Score and Plan: Ondansetron, Dexamethasone and Treatment may vary due to age or medical condition  Airway Management Planned: Oral ETT  Additional Equipment:   Intra-op Plan:   Post-operative Plan: Extubation in OR  Informed Consent: I have reviewed the patients History and Physical, chart, labs and discussed the procedure including the risks, benefits and alternatives for the proposed anesthesia with the patient or authorized representative who has indicated his/her understanding and acceptance.     Dental advisory given  Plan Discussed with: CRNA  Anesthesia Plan Comments:          Anesthesia Quick Evaluation

## 2022-09-16 NOTE — Discharge Instructions (Addendum)
CYSTOSCOPY HOME CARE INSTRUCTIONS  Activity: Rest for the remainder of the day.  Do not drive or operate equipment today.  You may resume normal activities in one to two days as instructed by your physician.   Meals: Drink plenty of liquids and eat light foods such as gelatin or soup this evening.  You may return to a normal meal plan tomorrow.  Return to Work: You may return to work in one to two days or as instructed by your physician.  Special Instructions / Symptoms: Call your physician if any of these symptoms occur:   -persistent or heavy bleeding  -bleeding which continues after first few urination  -large blood clots that are difficult to pass  -urine stream diminishes or stops completely  -fever equal to or higher than 101 degrees Farenheit.  -cloudy urine with a strong, foul odor  -severe pain  Females should always wipe from front to back after elimination.  You may feel some burning pain when you urinate.  This should disappear with time.  Applying moist heat to the lower abdomen or a hot tub bath may help relieve the pain. \  We will call you with follow up. If you do not hear from [place] by Tuesday, please call to inquire about follow up

## 2022-09-16 NOTE — Anesthesia Postprocedure Evaluation (Signed)
Anesthesia Post Note  Patient: [name]  Procedure(s) Performed: CYSTOSCOPY WITH RETROGRADE PYELOGRAM AND RIGHT STENT PLACEMENT (Right: Ureter)     Patient location during evaluation: PACU Anesthesia Type: General Level of consciousness: sedated and patient cooperative Pain management: pain level controlled Vital Signs Assessment: post-procedure vital signs reviewed and stable Respiratory status: spontaneous breathing Cardiovascular status: stable Anesthetic complications: no   No notable events documented.  Last Vitals:  Vitals:   09/16/22 1745 09/16/22 1800  BP: (!) 165/95 (!) 161/97  Pulse: 71 73  Resp: 17 18  Temp:  36.7 C  SpO2: 97% 99%    Last Pain:  Vitals:   09/16/22 1800  TempSrc: Oral  PainSc: 2                  Lewie Loron

## 2022-09-16 NOTE — Transfer of Care (Signed)
Immediate Anesthesia Transfer of Care Note  Patient: [name]  Procedure(s) Performed: CYSTOSCOPY WITH RETROGRADE PYELOGRAM AND RIGHT STENT PLACEMENT (Right: Ureter)  Patient Location: PACU  Anesthesia Type:General  Level of Consciousness: awake, alert , and oriented  Airway & Oxygen Therapy: Patient Spontanous Breathing and Patient connected to face mask oxygen  Post-op Assessment: Report given to RN and Post -op Vital signs reviewed and stable  Post vital signs: Reviewed and stable  Last Vitals:  Vitals Value Taken Time  BP    Temp    Pulse 77 09/16/22 1659  Resp 16 09/16/22 1659  SpO2 100 % 09/16/22 1659  Vitals shown include unfiled device data.  Last Pain:  Vitals:   09/16/22 1453  TempSrc: Oral         Complications: No notable events documented.

## 2022-09-17 ENCOUNTER — Encounter (HOSPITAL_COMMUNITY): Payer: Self-pay | Admitting: Urology

## 2022-09-18 ENCOUNTER — Telehealth (HOSPITAL_BASED_OUTPATIENT_CLINIC_OR_DEPARTMENT_OTHER): Payer: Self-pay | Admitting: *Deleted

## 2022-09-18 NOTE — Telephone Encounter (Signed)
Post ED Visit - Positive Culture Follow-up: Successful Patient Follow-Up  Culture assessed and recommendations reviewed by:  []  Enzo Bi, Pharm.D. []  Celedonio Miyamoto, [address].D., BCPS AQ-ID []  Garvin Fila, Pharm.D., BCPS []  Georgina Pillion, Pharm.D., BCPS []  [place], [address].D., BCPS, AAHIVP []  Estella Husk, Pharm.D., BCPS, AAHIVP []  Lysle Pearl, PharmD, BCPS []  Phillips Climes, PharmD, BCPS []  Agapito Games, PharmD, BCPS [x]  [name], PharmD  Positive urine culture  []  Patient discharged without antimicrobial prescription and treatment is now indicated [x]  Organism is resistant to prescribed ED discharge antimicrobial []  Patient with positive blood cultures  Changes discussed with ED provider: Evlyn Kanner, PA-C New antibiotic prescription Fosfomycin 3g PO x 1 dose Stop Cipro Called to [place], [place], [place]  Contacted patient, date 09/18/22, time 1258   Patsey Berthold 09/18/2022, 12:58 PM

## 2022-09-18 NOTE — Progress Notes (Signed)
ED Antimicrobial Stewardship Positive Culture Follow Up   [name] is an 62 y.o. female who presented to [facility] on 09/14/2022 with a chief complaint of  Chief Complaint  Patient presents with   Flank Pain    Recent Results (from the past 720 hour(s))  Urine Culture     Status: Abnormal   Collection Time: 09/14/22  2:52 PM   Specimen: Urine, Clean Catch  Result Value Ref Range Status   Specimen Description   Final    URINE, CLEAN CATCH Performed at [facility], [address] [place], [place] [address]    Special Requests   Final    NONE Performed at [facility], [address] [place], [place] [address]    Culture (A)  Final    40,000 COLONIES/mL KLEBSIELLA PNEUMONIAE Confirmed Extended Spectrum Beta-Lactamase Producer (ESBL).  In bloodstream infections from ESBL organisms, carbapenems are preferred over piperacillin/tazobactam. They are shown to have a lower risk of mortality.    Report Status 09/17/2022 FINAL  Final   Organism ID, Bacteria KLEBSIELLA PNEUMONIAE (A)  Final      Susceptibility   Klebsiella pneumoniae - MIC*    AMPICILLIN >=32 RESISTANT Resistant     CEFAZOLIN >=64 RESISTANT Resistant     CEFEPIME >=32 RESISTANT Resistant     CEFTRIAXONE >=64 RESISTANT Resistant     CIPROFLOXACIN 1 RESISTANT Resistant     GENTAMICIN <=1 SENSITIVE Sensitive     IMIPENEM <=0.25 SENSITIVE Sensitive     NITROFURANTOIN 64 INTERMEDIATE Intermediate     TRIMETH/SULFA >=320 RESISTANT Resistant     AMPICILLIN/SULBACTAM >=32 RESISTANT Resistant     PIP/TAZO 16 SENSITIVE Sensitive     * 40,000 COLONIES/mL KLEBSIELLA PNEUMONIAE    [x]  Treated with cipro, organism resistant to prescribed antimicrobial []  Patient discharged originally without antimicrobial agent and treatment is now indicated  New antibiotic prescription: Fosfomycin, Urology follow up  ED Provider: Evlyn Kanner, PA-C   [name] 09/18/2022, 12:33 PM Clinical Pharmacist Monday - Friday phone -   [phone] Saturday - Sunday phone - [phone]

## 2022-09-20 ENCOUNTER — Telehealth: Payer: Self-pay | Admitting: *Deleted

## 2022-09-20 ENCOUNTER — Other Ambulatory Visit: Payer: Self-pay | Admitting: Urology

## 2022-09-20 ENCOUNTER — Telehealth: Payer: Self-pay | Admitting: Cardiology

## 2022-09-20 NOTE — Telephone Encounter (Signed)
I s/w the pt and she has been scheduled for a tele pre op 09/22/22 @ 3:20. Per Jari Favre, PAC ok to add on due to med hold and procedure date.    Med rec and consent are done.

## 2022-09-20 NOTE — Telephone Encounter (Signed)
I s/w the pt and she has been scheduled for a tele pre op 09/22/22 @ 3:20. Per Jari Favre, PAC ok to add on due to med hold and procedure date.   Med rec and consent are done.     Patient Consent for Virtual Visit        [name] has provided verbal consent on 09/20/2022 for a virtual visit (video or telephone).   CONSENT FOR VIRTUAL VISIT FOR:  [name]  By participating in this virtual visit I agree to the following:  I hereby voluntarily request, consent and authorize [facility] and its employed or contracted physicians, physician assistants, nurse practitioners or other licensed health care professionals (the Practitioner), to provide me with telemedicine health care services (the "Services") as deemed necessary by the treating Practitioner. I acknowledge and consent to receive the Services by the Practitioner via telemedicine. I understand that the telemedicine visit will involve communicating with the Practitioner through live audiovisual communication technology and the disclosure of certain medical information by electronic transmission. I acknowledge that I have been given the opportunity to request an in-person assessment or other available alternative prior to the telemedicine visit and am voluntarily participating in the telemedicine visit.  I understand that I have the right to withhold or withdraw my consent to the use of telemedicine in the course of my care at any time, without affecting my right to future care or treatment, and that the Practitioner or I may terminate the telemedicine visit at any time. I understand that I have the right to inspect all information obtained and/or recorded in the course of the telemedicine visit and may receive copies of available information for a reasonable fee.  I understand that some of the potential risks of receiving the Services via telemedicine include:  Delay or interruption in medical evaluation due to technological  equipment failure or disruption; Information transmitted may not be sufficient (e.g. poor resolution of images) to allow for appropriate medical decision making by the Practitioner; and/or  In rare instances, security protocols could fail, causing a breach of personal health information.  Furthermore, I acknowledge that it is my responsibility to provide information about my medical history, conditions and care that is complete and accurate to the best of my ability. I acknowledge that Practitioner's advice, recommendations, and/or decision may be based on factors not within their control, such as incomplete or inaccurate data provided by me or distortions of diagnostic images or specimens that may result from electronic transmissions. I understand that the practice of medicine is not an exact science and that Practitioner makes no warranties or guarantees regarding treatment outcomes. I acknowledge that a copy of this consent can be made available to me via my patient portal [facility] MyChart), or I can request a printed copy by calling the office of [facility].    I understand that my insurance will be billed for this visit.   I have read or had this consent read to me. I understand the contents of this consent, which adequately explains the benefits and risks of the Services being provided via telemedicine.  I have been provided ample opportunity to ask questions regarding this consent and the Services and have had my questions answered to my satisfaction. I give my informed consent for the services to be provided through the use of telemedicine in my medical care

## 2022-09-20 NOTE — Telephone Encounter (Signed)
   Pre-operative Risk Assessment    Patient Name: [name]  DOB: 01/27/1961 MRN: [id number]      Request for Surgical Clearance    Procedure:   Ureteroscopy  Date of Surgery:  Clearance 09/28/22                                 Surgeon:  Dr. Lucile Shutters Group or Practice Name:  [facility] Phone number:  [phone]  Fax number:  [phone]   Type of Clearance Requested:   - Pharmacy:  Hold Clopidogrel (Plavix)     Type of Anesthesia:  General    Additional requests/questions:   Caller is requesting patient stay on her aspirin but hold her Plavix for 5 days.    Signed, Annetta Maw   09/20/2022, 9:56 AM

## 2022-09-20 NOTE — Telephone Encounter (Signed)
   Name: [name]  DOB: Jun 26, 1960  MRN: [id number]  Primary Cardiologist: Nona Dell, MD  Chart reviewed as part of pre-operative protocol coverage. Because of ARANYA MORELES past medical history and time since last visit, she will require a follow-up Telephone visit in order to better assess preoperative cardiovascular risk.  Pre-op covering staff: - Please schedule appointment and call patient to inform them. If patient already had an upcoming appointment within acceptable timeframe, please add "pre-op clearance" to the appointment notes so provider is aware. - Please contact requesting surgeon's office via preferred method (i.e, phone, fax) to inform them of need for appointment prior to surgery.  OK to hold plavix for 5 days as long as patient is not having anginal symptoms at time of phone call. Prefer to continue aspirin perioperatively but can be held if surgeon concerned about increased bleeding risk   Jonita Albee, PA-C  09/20/2022, 10:48 AM

## 2022-09-22 ENCOUNTER — Ambulatory Visit: Payer: Medicare Other | Attending: Cardiology

## 2022-09-22 DIAGNOSIS — Z01810 Encounter for preprocedural cardiovascular examination: Secondary | ICD-10-CM | POA: Diagnosis not present

## 2022-09-22 NOTE — Progress Notes (Addendum)
COVID Vaccine received:  []  No [x]  Yes Date of any COVID positive Test in last 90 days:  none  PCP - Elfredia Nevins, MD Cardiologist -   Nona Dell, MD    Edd Fabian, NP   cardiac clearance in 09-22-22 note  Chest x-ray -  EKG -  07-20-2022  Epic Stress Test -  ECHO -  Cardiac Cath - 09-25-2020  LHC- DES x 1   PCR screen: []  Ordered & Completed           []   No Order but Needs PROFEND           [x]   N/A for this surgery  Surgery Plan:  [x]  Ambulatory                            []  Outpatient in bed                            []  Admit  Anesthesia:    [x]  General  []  Spinal                           []   Choice []   MAC  Bowel Prep - [x]  No  []   Yes ______  Pacemaker / ICD device [x]  No []  Yes   Spinal Cord Stimulator:[x]  No []  Yes       History of Sleep Apnea? [x]  No []  Yes   CPAP used?- [x]  No []  Yes    Does the patient monitor blood sugar?          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  Patient can perform ADLs without assistance.   Anesthesia review: CAD- DES, CKD4, anemia, anxiety, s/p triple transplant (Liver, Pancreas, stomach, large & small bowel) 2018 at [facility],   Patient denies shortness of breath, fever, cough and chest pain at PAT appointment.  Patient verbalized understanding and agreement to the Pre-Surgical Instructions that were given to them at this PAT appointment. Patient was also educated of the need to review these PAT instructions again prior to her surgery.I reviewed the appropriate phone numbers to call if they  have any and questions or concerns.

## 2022-09-22 NOTE — Patient Instructions (Addendum)
SURGICAL WAITING ROOM VISITATION Patients having surgery or a procedure may have no more than 2 support people in the waiting area - these visitors may rotate.    Children under the age of 110 must have an adult with them who is not the patient.  If the patient needs to stay at the hospital during part of their recovery, the visitor guidelines for inpatient rooms apply. Pre-op nurse will coordinate an appropriate time for 1 support person to accompany patient in pre-op.  This support person may not rotate.    Please refer to the [facility] website for the visitor guidelines for Inpatients (after your surgery is over and you are in a regular room).       Your procedure is scheduled on: 09-28-22   Report to [facility] Main Entrance    Report to admitting at 11:15 AM   Call this number if you have problems the morning of surgery [phone]   Do not eat food or drink liquids :After Midnight.           If you have questions, please contact your surgeon's office.   FOLLOW ANY ADDITIONAL PRE OP INSTRUCTIONS YOU RECEIVED FROM YOUR SURGEON'S OFFICE!!!     Oral Hygiene is also important to reduce your risk of infection.                                    Remember - BRUSH YOUR TEETH THE MORNING OF SURGERY WITH YOUR REGULAR TOOTHPASTE   Do NOT smoke after Midnight   Take these medicines the morning of surgery with A SIP OF WATER:   Atenolol  Bupropion  Cyclosporine  Mycophenolate  Pantoprazole  Pravastatin  Tamsulosin  Trintellix  Oxycodone or Tylenol if needed  Stop all vitamins and herbal supplements 7 days before surgery                               You may not have any metal on your body including hair pins, jewelry, and body piercing             Do not wear make-up, lotions, powders, perfumes or deodorant  Do not wear nail polish including gel and S&S, artificial/acrylic nails, or any other type of covering on natural nails including finger and toenails. If you  have artificial nails, gel coating, etc. that needs to be removed by a nail salon please have this removed prior to surgery or surgery may need to be canceled/ delayed if the surgeon/ anesthesia feels like they are unable to be safely monitored.   Do not shave  48 hours prior to surgery.        Do not bring valuables to the hospital. [facility] IS NOT RESPONSIBLE   FOR VALUABLES.   Contacts, dentures or bridgework may not be worn into surgery.   DO NOT BRING YOUR HOME MEDICATIONS TO THE HOSPITAL. PHARMACY WILL DISPENSE MEDICATIONS LISTED ON YOUR MEDICATION LIST TO YOU DURING YOUR ADMISSION IN THE HOSPITAL!    Patients discharged on the day of surgery will not be allowed to drive home.  Someone NEEDS to stay with you for the first 24 hours after anesthesia.   Special Instructions: Bring a copy of your healthcare power of attorney and living will documents the day of surgery if you haven't scanned them before.  Please read over the following fact sheets you were given: IF YOU HAVE QUESTIONS ABOUT YOUR PRE-OP INSTRUCTIONS PLEASE CALL [phone]    [facility] - Preparing for Surgery Before surgery, you can play an important role.  Because skin is not sterile, your skin needs to be as free of germs as possible.  You can reduce the number of germs on your skin by washing with CHG (chlorahexidine gluconate) soap before surgery.  CHG is an antiseptic cleaner which kills germs and bonds with the skin to continue killing germs even after washing. Please DO NOT use if you have an allergy to CHG or antibacterial soaps.  If your skin becomes reddened/irritated stop using the CHG and inform your nurse when you arrive at Short Stay. Do not shave (including legs and underarms) for at least 48 hours prior to the first CHG shower.  You may shave your face/neck.  Please follow these instructions carefully:  1.  Shower with CHG Soap the night before surgery and the  morning of surgery.  2.  If  you choose to wash your hair, wash your hair first as usual with your normal  shampoo.  3.  After you shampoo, rinse your hair and body thoroughly to remove the shampoo.                             4.  Use CHG as you would any other liquid soap.  You can apply chg directly to the skin and wash.  Gently with a scrungie or clean washcloth.  5.  Apply the CHG Soap to your body ONLY FROM THE NECK DOWN.   Do not use on face/ open                           Wound or open sores. Avoid contact with eyes, ears mouth and genitals (private parts).                       Wash face,  Genitals (private parts) with your normal soap.             6.  Wash thoroughly, paying special attention to the area where your  surgery  will be performed.  7.  Thoroughly rinse your body with warm water from the neck down.  8.  DO NOT shower/wash with your normal soap after using and rinsing off the CHG Soap.                9.  Pat yourself dry with a clean towel.            10.  Wear clean pajamas.            11.  Place clean sheets on your bed the night of your first shower and do not  sleep with pets. Day of Surgery : Do not apply any lotions/deodorants the morning of surgery.  Please wear clean clothes to the hospital/surgery center.  FAILURE TO FOLLOW THESE INSTRUCTIONS MAY RESULT IN THE CANCELLATION OF YOUR SURGERY  PATIENT SIGNATURE_________________________________  NURSE SIGNATURE__________________________________  ________________________________________________________________________

## 2022-09-22 NOTE — Progress Notes (Signed)
Virtual Visit via Telephone Note   Because of [name] co-morbid illnesses, she is at least at moderate risk for complications without adequate follow up.  This format is felt to be most appropriate for this patient at this time.  The patient did not have access to video technology/had technical difficulties with video requiring transitioning to audio format only (telephone).  All issues noted in this document were discussed and addressed.  No physical exam could be performed with this format.  Please refer to the patient's chart for her consent to telehealth for [facility].  Evaluation Performed:  Preoperative cardiovascular risk assessment _____________   Date:  09/22/2022   Patient ID:  [name], DOB [birth year], MRN [id number] Patient Location:  Home Provider location:   Office  Primary Care Provider:  Elfredia Nevins, MD Primary Cardiologist:  Nona Dell, MD  Chief Complaint / Patient Profile   62 y.o. y/o female with a h/o coronary artery disease, hyperlipidemia, CKD stage IV who is pending ureteroscopy and presents today for telephonic preoperative cardiovascular risk assessment.  History of Present Illness    [name] is a 62 y.o. female who presents via audio/video conferencing for a telehealth visit today.  Pt was last seen in cardiology clinic on 07/20/2022 by Dr. Diona Browner.  At that time DAESHA SUTHERBY was doing well .  The patient is now pending procedure as outlined above. Since her last visit, she remained stable from a cardiac standpoint.  Today she denies chest pain, shortness of breath, lower extremity edema, fatigue, palpitations, melena, hematuria, hemoptysis, diaphoresis, weakness, presyncope, syncope, orthopnea, and PND.   Past Medical History    Past Medical History:  Diagnosis Date   Anemia    Anxiety    Ascites    CKD (chronic kidney disease) stage 4, GFR 15-29 ml/min (HCC)    Coronary artery disease    Depression     Enteropathy 07/31/2015   Portal hypertensive enteropathy per capsule study, with stigmata of bleeding.   Esophageal varices (HCC)    Gastric ulcer 07/15/2015   EGD; no stigmata of bleeding   GI bleed 07/17/2015   Gram-negative bacteremia 12/08/2011   Headache(784.0)    Heart murmur    History of cirrhosis    History of hepatitis C - successfuly treated medically 07/14/2015   Myocardial infarction [facility])    Nephrolithiasis    Portal vein thrombosis    Retroperitoneal hemorrhage complicating cardiac catheterization, small 11/06/2020   Right ureteral stone 12/07/2011   S/P angioplasty CTO with stent  (DES) 11/04/20 of mid LAD 11/06/2020   Sleep apnea    Status post liver transplant (HCC) 08/2016   [facility]   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   [facility]   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   [facility]   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   Type 2 diabetes mellitus (HCC)    Past Surgical History:  Procedure Laterality Date   ABDOMINAL HYSTERECTOMY  2003   CESAREAN SECTION  1995   CHOLECYSTECTOMY  1987   COLONOSCOPY     COLONOSCOPY  10/21/2011   Procedure: COLONOSCOPY;  Surgeon: Malissa Hippo, MD;  Location: [facility]O SU[facility];  Service: Endoscopy;  Laterality: N/A;  245    CORONARY CTO INTERVENTION N/A 11/04/2020   Procedure: CORONARY CTO INTERVENTION;  Surgeon: [place], Peter M, MD;  Location: [facility] INVASIVE CV LAB;  Service: Cardiovascular;  Laterality: N/A;   CYSTOSCOPY W/ RETROGRADES  12/08/2011  Procedure: CYSTOSCOPY WITH RETROGRADE PYELOGRAM;  Surgeon: Ky Barban, MD;  Location: [facility] ORS;  Service: Urology;  Laterality: Right;   CYSTOSCOPY WITH RETROGRADE PYELOGRAM, URETEROSCOPY AND STENT PLACEMENT Right 09/16/2022   Procedure: CYSTOSCOPY WITH RETROGRADE PYELOGRAM AND RIGHT STENT PLACEMENT;  Surgeon: Crist Fat, MD;  Location: [facility] ORS;  Service: Urology;  Laterality: Right;   CYSTOSCOPY WITH STENT PLACEMENT  12/08/2011   Procedure:  CYSTOSCOPY WITH STENT PLACEMENT;  Surgeon: Ky Barban, MD;  Location: [facility] ORS;  Service: Urology;  Laterality: Right;   DILATION AND CURETTAGE OF UTERUS     ESOPHAGOGASTRODUODENOSCOPY N/A 02/19/2014   Procedure: ESOPHAGOGASTRODUODENOSCOPY (EGD);  Surgeon: Malissa Hippo, MD;  Location: [facility];  Service: Endoscopy;  Laterality: N/A;  100   ESOPHAGOGASTRODUODENOSCOPY N/A 07/15/2015   Procedure: ESOPHAGOGASTRODUODENOSCOPY (EGD);  Surgeon: Malissa Hippo, MD;  [facility];  Service: Endoscopy;  Laterality: N/A;   ESOPHAGOGASTRODUODENOSCOPY N/A 07/29/2015   Procedure: ESOPHAGOGASTRODUODENOSCOPY (EGD);  Surgeon: Malissa Hippo, MD;  [facility]: [facility];  Service: Endoscopy;  Laterality: N/A;   GASTRIC BYPASS  ~ 1995   HERNIA REPAIR  04/14/11   "one in my bellybutton" (11/07/2012)   INGUINAL HERNIA REPAIR Right    "maybe 2" (11/07/2012)   KNEE ARTHROSCOPY Right    LEFT HEART CATH AND CORONARY ANGIOGRAPHY N/A 09/25/2020   Procedure: LEFT HEART CATH AND CORONARY ANGIOGRAPHY;  Surgeon: Tonny Bollman, MD;  Location: [facility] INVASIVE CV LAB;  Service: Cardiovascular;  Laterality: N/A;   LITHOTRIPSY     "several times" (11/07/2012)   OPEN REDUCTION INTERNAL FIXATION (ORIF) DISTAL RADIAL FRACTURE Right 11/07/2012   Procedure: OPEN REDUCTION INTERNAL FIXATION (ORIF) RIGHT DISTAL RADIAL FRACTURE;  Surgeon: Sharma Covert, MD;  Location: [facility] OR;  Service: Orthopedics;  Laterality: Right;   ORIF DISTAL RADIUS FRACTURE Right 11/07/2012   OTHER SURGICAL HISTORY  08/14/2016   5 organ transplant- liver, stomach, pancreas, small and large intestine   UPPER GASTROINTESTINAL ENDOSCOPY      Allergies  Allergies  Allergen Reactions   Ancef [Cefazolin Sodium] Other (See Comments)    Reaction:  Vaginal and mouth blisters   Nucynta [Tapentadol]     Pt unsure of allergy   Cefazolin Rash    Blisters in mouth and vagina    Gadobenate Itching, Nausea And Vomiting and Rash   Iodinated Contrast  Media Itching, Nausea And Vomiting and Rash   Tape Dermatitis and Rash    Adhesive on Surgical Tape, blisters skin     Home Medications    Prior to Admission medications   Medication Sig Start Date End Date Taking? Authorizing Provider  acetaminophen (TYLENOL) 325 MG tablet Take 2 tablets (650 mg total) by mouth every 4 (four) hours as needed for headache or mild pain. 11/06/20   Leone Brand, NP  aspirin EC 81 MG tablet Take 1 tablet (81 mg total) by mouth daily. Swallow whole. 05/14/20   Jonelle Sidle, MD  atenolol (TENORMIN) 25 MG tablet Take 0.5 tablets (12.5 mg total) by mouth daily. 08/19/22   Jonelle Sidle, MD  buPROPion ER [facility] SR) 100 MG 12 hr tablet Take 100 mg by mouth daily. 06/14/21   [provider]  clopidogrel (PLAVIX) 75 MG tablet TAKE 1 TABLET ONCE DAILY. 07/11/22   Jonelle Sidle, MD  cycloSPORINE (SANDIMMUNE) 100 MG capsule Take 100 mg by mouth daily.    [provider]  cycloSPORINE (SANDIMMUNE) 25 MG capsule Take 75 mg by mouth  at bedtime.    [provider]  ezetimibe (ZETIA) 10 MG tablet take 1 tablet once daily. 06/13/22   Jonelle Sidle, MD  Multiple Vitamins-Minerals (SUPER THERA VITE M PO) Take 1 tablet by mouth daily.    [provider]  mycophenolate (CELLCEPT) 250 MG capsule Take 250 mg by mouth every morning.    [provider]  nitroGLYCERIN (NITROSTAT) 0.4 MG SL tablet Place 1 tablet (0.4 mg total) under the tongue every 5 (five) minutes as needed. 06/11/20   Jonelle Sidle, MD  Oxycodone HCl 10 MG TABS Take 10 mg by mouth every 6 (six) hours as needed (pain).    [provider]  pantoprazole (PROTONIX) 40 MG tablet Take 40 mg by mouth at bedtime. 09/17/20   [provider]  phentermine (ADIPEX-P) 37.5 MG tablet Take 37.5 mg by mouth daily. 06/04/21   [provider]  pravastatin (PRAVACHOL) 20 MG tablet Take 1 tablet (20 mg total) by mouth daily. 07/20/22   Jonelle Sidle, MD  tamsulosin (FLOMAX) 0.4 MG CAPS capsule Take 1 capsule (0.4 mg total) by mouth daily. 09/16/22   Matthew-Onabanjo, [place], MD  traZODone (DESYREL) 100 MG tablet Take 100 mg by mouth at bedtime.    [provider]  TRINTELLIX 10 MG TABS tablet Take 10 mg by mouth daily. 06/14/21   [provider]    Physical Exam    Vital Signs:  EMRA LIVSHITS does not have vital signs available for review today.  Given telephonic nature of communication, physical exam is limited. AAOx3. NAD. Normal affect.  Speech and respirations are unlabored.  Accessory Clinical Findings    None  Assessment & Plan    1.  Preoperative Cardiovascular Risk Assessment: Ureteroscopy, 09/28/2022, Dr. Berneice Heinrich, [facility], fax #[phone].      Primary Cardiologist: Nona Dell, MD  Chart reviewed as part of pre-operative protocol coverage. Given past medical history and time since last visit, based on ACC/AHA guidelines, STACYE NEALL would be at acceptable risk for the planned procedure without further cardiovascular testing.   Her Plavix may be held for 5 days prior to her procedure.  Please resume as soon as hemostasis is achieved.  Patient was advised that if she develops new symptoms prior to surgery to contact our office to arrange a follow-up appointment.  She verbalized understanding.  I will route this recommendation to the requesting party via Epic fax function and remove from pre-op pool.       Time:   Today, I have spent  5 minutes with the patient with telehealth technology discussing medical history, symptoms, and management plan.  Prior to her phone evaluation I spent greater than 10 minutes reviewing her past medical history and cardiac medications.   Ronney Asters, NP  09/22/2022, 8:07 AM

## 2022-09-23 ENCOUNTER — Other Ambulatory Visit: Payer: Self-pay

## 2022-09-23 ENCOUNTER — Encounter (HOSPITAL_COMMUNITY): Payer: Self-pay

## 2022-09-23 ENCOUNTER — Encounter (HOSPITAL_COMMUNITY)
Admission: RE | Admit: 2022-09-23 | Discharge: 2022-09-23 | Disposition: A | Discharge status: Home / Self Care | Payer: Medicare Other | Source: Ambulatory Visit | Attending: Urology | Admitting: Urology

## 2022-09-23 VITALS — BP 143/79 | HR 58 | Temp 98.2°F | Resp 18 | Ht 65.0 in | Wt 197.4 lb

## 2022-09-23 DIAGNOSIS — N184 Chronic kidney disease, stage 4 (severe): Secondary | ICD-10-CM | POA: Diagnosis not present

## 2022-09-23 DIAGNOSIS — R319 Hematuria, unspecified: Secondary | ICD-10-CM | POA: Diagnosis not present

## 2022-09-23 DIAGNOSIS — E1122 Type 2 diabetes mellitus with diabetic chronic kidney disease: Secondary | ICD-10-CM | POA: Insufficient documentation

## 2022-09-23 DIAGNOSIS — E119 Type 2 diabetes mellitus without complications: Secondary | ICD-10-CM

## 2022-09-23 DIAGNOSIS — Z944 Liver transplant status: Secondary | ICD-10-CM | POA: Diagnosis not present

## 2022-09-23 DIAGNOSIS — Z01812 Encounter for preprocedural laboratory examination: Secondary | ICD-10-CM | POA: Diagnosis not present

## 2022-09-23 DIAGNOSIS — Z9482 Intestine transplant status: Secondary | ICD-10-CM | POA: Insufficient documentation

## 2022-09-23 DIAGNOSIS — Z9483 Pancreas transplant status: Secondary | ICD-10-CM | POA: Diagnosis not present

## 2022-09-23 DIAGNOSIS — K769 Liver disease, unspecified: Secondary | ICD-10-CM | POA: Diagnosis not present

## 2022-09-23 DIAGNOSIS — Z01818 Encounter for other preprocedural examination: Secondary | ICD-10-CM | POA: Diagnosis not present

## 2022-09-23 DIAGNOSIS — N132 Hydronephrosis with renal and ureteral calculous obstruction: Secondary | ICD-10-CM | POA: Diagnosis not present

## 2022-09-23 DIAGNOSIS — Z9489 Other transplanted organ and tissue status: Secondary | ICD-10-CM | POA: Diagnosis not present

## 2022-09-23 DIAGNOSIS — D649 Anemia, unspecified: Secondary | ICD-10-CM

## 2022-09-23 DIAGNOSIS — D631 Anemia in chronic kidney disease: Secondary | ICD-10-CM | POA: Insufficient documentation

## 2022-09-23 HISTORY — DX: Fibromyalgia: M79.7

## 2022-09-23 HISTORY — DX: Malignant (primary) neoplasm, unspecified: C80.1

## 2022-09-23 HISTORY — DX: Personal history of urinary calculi: Z87.442

## 2022-09-23 LAB — COMPREHENSIVE METABOLIC PANEL
ALT: 25 U/L (ref 0–44)
AST: 28 U/L (ref 15–41)
Albumin: 2.8 g/dL — ABNORMAL LOW (ref 3.5–5.0)
Alkaline Phosphatase: 77 U/L (ref 38–126)
Anion gap: 8 (ref 5–15)
BUN: 25 mg/dL — ABNORMAL HIGH (ref 8–23)
CO2: 21 mmol/L — ABNORMAL LOW (ref 22–32)
Calcium: 8.2 mg/dL — ABNORMAL LOW (ref 8.9–10.3)
Chloride: 110 mmol/L (ref 98–111)
Creatinine, Ser: 2.46 mg/dL — ABNORMAL HIGH (ref 0.44–1.00)
GFR, Estimated: 22 mL/min — ABNORMAL LOW (ref >60)
Glucose, Bld: 99 mg/dL (ref 70–99)
Potassium: 4.5 mmol/L (ref 3.5–5.1)
Sodium: 139 mmol/L (ref 135–145)
Total Bilirubin: 0.5 mg/dL (ref 0.3–1.2)
Total Protein: 6.3 g/dL — ABNORMAL LOW (ref 6.5–8.1)

## 2022-09-23 LAB — CBC
HCT: 29.7 % — ABNORMAL LOW (ref 36.0–46.0)
Hemoglobin: 8.7 g/dL — ABNORMAL LOW (ref 12.0–15.0)
MCH: 29.3 pg (ref 26.0–34.0)
MCHC: 29.3 g/dL — ABNORMAL LOW (ref 30.0–36.0)
MCV: 100 fL (ref 80.0–100.0)
Platelets: 409 10*3/uL — ABNORMAL HIGH (ref 150–400)
RBC: 2.97 MIL/uL — ABNORMAL LOW (ref 3.87–5.11)
RDW: 13.2 % (ref 11.5–15.5)
WBC: 9 10*3/uL (ref 4.0–10.5)
nRBC: 0 % (ref 0.0–0.2)

## 2022-09-26 NOTE — Anesthesia Preprocedure Evaluation (Signed)
Anesthesia Evaluation  Patient identified by MRN, date of birth, ID band Patient awake    Reviewed: Allergy & Precautions, NPO status , Patient's Chart, lab work & pertinent test results, reviewed documented beta blocker date and time   Airway Mallampati: II  TM Distance: >3 FB Neck ROM: Full    Dental no notable dental hx.    Pulmonary sleep apnea , former smoker   Pulmonary exam normal breath sounds clear to auscultation       Cardiovascular + CAD and + Past MI  Normal cardiovascular exam Rhythm:Regular Rate:Normal     Neuro/Psych  Headaches  Anxiety Depression       GI/Hepatic PUD,,,(+) Hepatitis -  Endo/Other  diabetes    Renal/GU Renal disease     Musculoskeletal  (+)  Fibromyalgia -  Abdominal   Peds  Hematology  (+) Blood dyscrasia, anemia   Anesthesia Other Findings   Reproductive/Obstetrics                              Anesthesia Physical Anesthesia Plan  ASA: 3  Anesthesia Plan: General   Post-op Pain Management:    Induction: Intravenous  PONV Risk Score and Plan: Ondansetron and Dexamethasone  Airway Management Planned: LMA  Additional Equipment:   Intra-op Plan:   Post-operative Plan: Extubation in OR  Informed Consent: I have reviewed the patients History and Physical, chart, labs and discussed the procedure including the risks, benefits and alternatives for the proposed anesthesia with the patient or authorized representative who has indicated his/her understanding and acceptance.     Dental advisory given  Plan Discussed with: CRNA  Anesthesia Plan Comments: (62 y.o. former smoker with h/o sleep apnea, DM II, CKD Stage IV, s/p triple transplant (Liver, Pancreas, stomach, large & small bowel) 2018 at [facility], CAD s/p DES 2022, right hydronephrosis, hematuria, kidney stone scheduled for above procedure 09/28/2022 with Dr. Sebastian Ache)         Anesthesia Quick Evaluation

## 2022-09-26 NOTE — Progress Notes (Signed)
Anesthesia Chart Review   Case: [id number] Date/Time: 09/28/22 1315   Procedure: CYSTOSCOPY BILATERAL RETROGRADES RIGHT URETEROSCOPY/HOLMIUM LASER/STENT EXCHANGE (Bilateral) - 75 MINS FOR CASE   Anesthesia type: General   Pre-op diagnosis: RIGHT HYDRONEPHROIS HEMATURIA AND KIDNEY STONE   Location: [facility]OM 03 / [facility]   Surgeons: Loletta Parish., MD       DISCUSSION:62 y.o. former smoker with h/o sleep apnea, DM II, CKD Stage IV, s/p triple transplant (Liver, Pancreas, stomach, large & small bowel) 2018 at [facility], CAD s/p DES 2022, right hydronephrosis, hematuria, kidney stone scheduled for above procedure 09/28/2022 with Dr. Sebastian Ache.   Per cardiology preoperative evaluation 09/22/2022, "Chart reviewed as part of pre-operative protocol coverage. Given past medical history and time since last visit, based on ACC/AHA guidelines, [name] would be at acceptable risk for the planned procedure without further cardiovascular testing.    Her Plavix may be held for 5 days prior to her procedure.  Please resume as soon as hemostasis is achieved."  Pt last seen by [facility] transplant 08/18/2022. Creatinine stable in mid 2's. Pt on clyclosporine and cellcept, Cellcept decreased at this visit due to bradycardia.  VS: BP (!) 143/79 Comment: right arm sitting  Pulse (!) 58   Temp 36.8 C (Oral)   Resp 18   Ht 5\' 5"  (1.651 m)   Wt 89.5 kg   SpO2 100%   BMI 32.85 kg/m   PROVIDERS: Elfredia Nevins, MD is PCP   Cardiologist -   Nona Dell, MD  LABS: Labs reviewed: Acceptable for surgery. (all labs ordered are listed, but only abnormal results are displayed)  Labs Reviewed  COMPREHENSIVE METABOLIC PANEL - Abnormal; Notable for the following components:      Result Value   CO2 21 (*)    BUN 25 (*)    Creatinine, Ser 2.46 (*)    Calcium 8.2 (*)    Total Protein 6.3 (*)    Albumin 2.8 (*)    GFR, Estimated 22 (*)    All other components within normal limits  CBC - Abnormal;  Notable for the following components:   RBC 2.97 (*)    Hemoglobin 8.7 (*)    HCT 29.7 (*)    MCHC 29.3 (*)    Platelets 409 (*)    All other components within normal limits     IMAGES:   EKG:   CV: Echo 05/14/2020  1. Left ventricular ejection fraction, by estimation, is 60 to 65%. The  left ventricle has normal function. The left ventricle has no regional  wall motion abnormalities. There is mild left ventricular hypertrophy.  Left ventricular diastolic parameters  were normal. Normal global longitudinal strain of -19.1%.   2. Right ventricular systolic function is normal. The right ventricular  size is normal. There is normal pulmonary artery systolic pressure. The  estimated right ventricular systolic pressure is 29.0 mmHg.   3. Left atrial size was mildly dilated.   4. The mitral valve is grossly normal. Mild mitral valve regurgitation.   5. The aortic valve is tricuspid. Aortic valve regurgitation is not  visualized.   6. The inferior vena cava is normal in size with greater than 50%  respiratory variability, suggesting right atrial pressure of 3 mmHg.   Comparison(s): Echocardiogram done 02/27/17 ay [facility] showed an EF of 56%.   Past Medical History:  Diagnosis Date   Anemia    Anxiety    Ascites    Cancer (HCC)  numerous skin cancer   CKD (chronic kidney disease) stage 4, GFR 15-29 ml/min (HCC)    Coronary artery disease    Depression    Enteropathy 07/31/2015   Portal hypertensive enteropathy per capsule study, with stigmata of bleeding.   Esophageal varices (HCC)    Fibromyalgia    Gastric ulcer 07/15/2015   EGD; no stigmata of bleeding   GI bleed 07/17/2015   Gram-negative bacteremia 12/08/2011   Headache(784.0)    Heart murmur    History of cirrhosis    History of hepatitis C - successfuly treated medically 07/14/2015   History of kidney stones    Myocardial infarction [facility])    Nephrolithiasis    Portal vein thrombosis    Retroperitoneal  hemorrhage complicating cardiac catheterization, small 11/06/2020   Right ureteral stone 12/07/2011   S/P angioplasty CTO with stent  (DES) 11/04/20 of mid LAD 11/06/2020   Sleep apnea    Status post liver transplant (HCC) 08/2016   [facility]   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   [facility]   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   [facility]   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   Type 2 diabetes mellitus (HCC)     Past Surgical History:  Procedure Laterality Date   ABDOMINAL HYSTERECTOMY  2003   CESAREAN SECTION  1995   CHOLECYSTECTOMY  1987   COLONOSCOPY     COLONOSCOPY  10/21/2011   Procedure: COLONOSCOPY;  Surgeon: Malissa Hippo, MD;  Location[facility];  Service: Endoscopy;  Laterality: N/A;  245    CORONARY CTO INTERVENTION N/A 11/04/2020   Procedure: CORONARY CTO INTERVENTION;  Surgeon: Swaziland, Peter M, MD;  Location: [facility] INVASIVE CV LAB;  Service: Cardiovascular;  Laterality: N/A;   CYSTOSCOPY W/ RETROGRADES  12/08/2011   Procedure: CYSTOSCOPY WITH RETROGRADE PYELOGRAM;  Surgeon: Ky Barban, MD;  Location: [facility];  Service: Urology;  Laterality: Right;   CYSTOSCOPY WITH RETROGRADE PYELOGRAM, URETEROSCOPY AND STENT PLACEMENT Right 09/16/2022   Procedure: CYSTOSCOPY WITH RETROGRADE PYELOGRAM AND RIGHT STENT PLACEMENT;  Surgeon: Crist Fat, MD;  Location[facility];  Service: Urology;  Laterality: Right;   CYSTOSCOPY WITH STENT PLACEMENT  12/08/2011   Procedure: CYSTOSCOPY WITH STENT PLACEMENT;  Surgeon: Ky Barban, MD;  Location: [facility];  Service: Urology;  Laterality: Right;   DILATION AND CURETTAGE OF UTERUS     ESOPHAGOGASTRODUODENOSCOPY N/A 02/19/2014   Procedure: ESOPHAGOGASTRODUODENOSCOPY (EGD);  Surgeon: Malissa Hippo, MD;  Location[facility];  Service: Endoscopy;  Laterality: N/A;  100   ESOPHAGOGASTRODUODENOSCOPY N/A 07/15/2015   Procedure: ESOPHAGOGASTRODUODENOSCOPY (EGD);  Surgeon: Malissa Hippo, MD;  [facility];  Service: Endoscopy;  Laterality: N/A;   ESOPHAGOGASTRODUODENOSCOPY N/A 07/29/2015   Procedure: ESOPHAGOGASTRODUODENOSCOPY (EGD);  Surgeon: Malissa Hippo, MD;  Location[facility];  Service: Endoscopy;  Laterality: N/A;   GASTRIC BYPASS  ~ 1995   HERNIA REPAIR  04/14/11   "one in my bellybutton" (11/07/2012)   INGUINAL HERNIA REPAIR Right    "maybe 2" (11/07/2012)   KNEE ARTHROSCOPY Right    LEFT HEART CATH AND CORONARY ANGIOGRAPHY N/A 09/25/2020   Procedure: LEFT HEART CATH AND CORONARY ANGIOGRAPHY;  Surgeon: Tonny Bollman, MD;  Location: [facility] INVASIVE CV LAB;  Service: Cardiovascular;  Laterality: N/A;   LITHOTRIPSY     "several times" (11/07/2012)   OPEN REDUCTION INTERNAL FIXATION (ORIF) DISTAL RADIAL FRACTURE Right 11/07/2012   Procedure: OPEN REDUCTION INTERNAL FIXATION (ORIF) RIGHT DISTAL RADIAL  FRACTURE;  Surgeon: Sharma Covert, MD;  Location: [facility] OR;  Service: Orthopedics;  Laterality: Right;   ORIF DISTAL RADIUS FRACTURE Right 11/07/2012   OTHER SURGICAL HISTORY  08/14/2016   5 organ transplant- liver, stomach, pancreas, small and large intestine   UPPER GASTROINTESTINAL ENDOSCOPY      MEDICATIONS:  acetaminophen (TYLENOL) 325 MG tablet   aspirin EC 81 MG tablet   atenolol (TENORMIN) 25 MG tablet   buPROPion ER (WELLBUTRIN SR) 100 MG 12 hr tablet   clopidogrel (PLAVIX) 75 MG tablet   cycloSPORINE (SANDIMMUNE) 100 MG capsule   cycloSPORINE (SANDIMMUNE) 25 MG capsule   ezetimibe (ZETIA) 10 MG tablet   Multiple Vitamins-Minerals (SUPER THERA VITE M PO)   mycophenolate (CELLCEPT) 250 MG capsule   nitroGLYCERIN (NITROSTAT) 0.4 MG SL tablet   Oxycodone HCl 10 MG TABS   pantoprazole (PROTONIX) 40 MG tablet   phentermine (ADIPEX-P) 37.5 MG tablet   pravastatin (PRAVACHOL) 20 MG tablet   tamsulosin (FLOMAX) 0.4 MG CAPS capsule   traZODone (DESYREL) 100 MG tablet   TRINTELLIX 10 MG TABS tablet   No current facility-administered medications for this encounter.     Jodell Cipro Ward, PA-C [facility] [phone]

## 2022-09-28 ENCOUNTER — Ambulatory Visit (HOSPITAL_COMMUNITY): Payer: Medicare Other | Admitting: Physician Assistant

## 2022-09-28 ENCOUNTER — Encounter (HOSPITAL_COMMUNITY): Payer: Self-pay | Admitting: Urology

## 2022-09-28 ENCOUNTER — Ambulatory Visit (HOSPITAL_COMMUNITY): Payer: Medicare Other

## 2022-09-28 ENCOUNTER — Ambulatory Visit (HOSPITAL_COMMUNITY)
Admission: RE | Admit: 2022-09-28 | Discharge: 2022-09-28 | Disposition: A | Discharge status: Home / Self Care | Payer: Medicare Other | Source: Ambulatory Visit | Attending: Urology | Admitting: Urology

## 2022-09-28 ENCOUNTER — Encounter (HOSPITAL_COMMUNITY)
Admission: RE | Disposition: A | Discharge status: Home / Self Care | Payer: Self-pay | Source: Ambulatory Visit | Attending: Urology

## 2022-09-28 ENCOUNTER — Ambulatory Visit (HOSPITAL_BASED_OUTPATIENT_CLINIC_OR_DEPARTMENT_OTHER): Payer: Medicare Other | Admitting: Anesthesiology

## 2022-09-28 DIAGNOSIS — N184 Chronic kidney disease, stage 4 (severe): Secondary | ICD-10-CM | POA: Diagnosis not present

## 2022-09-28 DIAGNOSIS — I2510 Atherosclerotic heart disease of native coronary artery without angina pectoris: Secondary | ICD-10-CM | POA: Diagnosis not present

## 2022-09-28 DIAGNOSIS — E1122 Type 2 diabetes mellitus with diabetic chronic kidney disease: Secondary | ICD-10-CM | POA: Diagnosis not present

## 2022-09-28 DIAGNOSIS — E119 Type 2 diabetes mellitus without complications: Secondary | ICD-10-CM

## 2022-09-28 DIAGNOSIS — F418 Other specified anxiety disorders: Secondary | ICD-10-CM

## 2022-09-28 DIAGNOSIS — I252 Old myocardial infarction: Secondary | ICD-10-CM | POA: Diagnosis not present

## 2022-09-28 DIAGNOSIS — N1330 Unspecified hydronephrosis: Secondary | ICD-10-CM | POA: Diagnosis not present

## 2022-09-28 DIAGNOSIS — Z6832 Body mass index (BMI) 32.0-32.9, adult: Secondary | ICD-10-CM | POA: Diagnosis not present

## 2022-09-28 DIAGNOSIS — Z87891 Personal history of nicotine dependence: Secondary | ICD-10-CM | POA: Insufficient documentation

## 2022-09-28 DIAGNOSIS — N201 Calculus of ureter: Secondary | ICD-10-CM | POA: Diagnosis not present

## 2022-09-28 DIAGNOSIS — M797 Fibromyalgia: Secondary | ICD-10-CM | POA: Diagnosis not present

## 2022-09-28 DIAGNOSIS — Z944 Liver transplant status: Secondary | ICD-10-CM | POA: Insufficient documentation

## 2022-09-28 DIAGNOSIS — N200 Calculus of kidney: Secondary | ICD-10-CM | POA: Diagnosis not present

## 2022-09-28 DIAGNOSIS — G4730 Sleep apnea, unspecified: Secondary | ICD-10-CM | POA: Diagnosis not present

## 2022-09-28 DIAGNOSIS — Z9884 Bariatric surgery status: Secondary | ICD-10-CM | POA: Diagnosis not present

## 2022-09-28 DIAGNOSIS — Z8619 Personal history of other infectious and parasitic diseases: Secondary | ICD-10-CM | POA: Insufficient documentation

## 2022-09-28 DIAGNOSIS — N132 Hydronephrosis with renal and ureteral calculous obstruction: Secondary | ICD-10-CM | POA: Diagnosis not present

## 2022-09-28 HISTORY — PX: CYSTOSCOPY/URETEROSCOPY/HOLMIUM LASER/STENT PLACEMENT: SHX6546

## 2022-09-28 LAB — GLUCOSE, CAPILLARY: Glucose-Capillary: 81 mg/dL (ref 70–99)

## 2022-09-28 SURGERY — CYSTOSCOPY/URETEROSCOPY/HOLMIUM LASER/STENT PLACEMENT
Anesthesia: General | Laterality: Bilateral

## 2022-09-28 MED ORDER — LIDOCAINE 2% (20 MG/ML) 5 ML SYRINGE
INTRAMUSCULAR | Status: DC | PRN
  Administered 2022-09-28: 100 mg via INTRAVENOUS

## 2022-09-28 MED ORDER — FENTANYL CITRATE (PF) 100 MCG/2ML IJ SOLN
INTRAMUSCULAR | Status: AC
  Filled 2022-09-28: qty 2

## 2022-09-28 MED ORDER — GENTAMICIN SULFATE 40 MG/ML IJ SOLN
5.0000 mg/kg | INTRAVENOUS | Status: AC
  Administered 2022-09-28: 350 mg via INTRAVENOUS
  Filled 2022-09-28: qty 8.75

## 2022-09-28 MED ORDER — FENTANYL CITRATE PF 50 MCG/ML IJ SOSY
PREFILLED_SYRINGE | INTRAMUSCULAR | Status: AC
  Administered 2022-09-28: 50 ug via INTRAVENOUS
  Filled 2022-09-28: qty 2

## 2022-09-28 MED ORDER — PROPOFOL 10 MG/ML IV BOLUS
INTRAVENOUS | Status: DC | PRN
  Administered 2022-09-28: 150 mg via INTRAVENOUS

## 2022-09-28 MED ORDER — SODIUM CHLORIDE 0.9 % IR SOLN
Status: DC | PRN
  Administered 2022-09-28: 3000 mL via INTRAVESICAL

## 2022-09-28 MED ORDER — EPHEDRINE SULFATE-NACL 50-0.9 MG/10ML-% IV SOSY
PREFILLED_SYRINGE | INTRAVENOUS | Status: DC | PRN
Start: 2022-09-28 — End: 2022-09-28
  Administered 2022-09-28: 5 mg via INTRAVENOUS
  Administered 2022-09-28: 10 mg via INTRAVENOUS

## 2022-09-28 MED ORDER — MIDAZOLAM HCL 2 MG/2ML IJ SOLN
INTRAMUSCULAR | Status: AC
  Filled 2022-09-28: qty 2

## 2022-09-28 MED ORDER — ONDANSETRON HCL 4 MG/2ML IJ SOLN
INTRAMUSCULAR | Status: DC | PRN
Start: 2022-09-28 — End: 2022-09-28
  Administered 2022-09-28: 4 mg via INTRAVENOUS

## 2022-09-28 MED ORDER — FENTANYL CITRATE (PF) 100 MCG/2ML IJ SOLN
INTRAMUSCULAR | Status: DC | PRN
  Administered 2022-09-28: 50 ug via INTRAVENOUS

## 2022-09-28 MED ORDER — OXYCODONE HCL 5 MG PO TABS: 5.0000 mg | ORAL_TABLET | Freq: Once | ORAL | Status: AC | PRN

## 2022-09-28 MED ORDER — PROPOFOL 10 MG/ML IV BOLUS
INTRAVENOUS | Status: AC
  Filled 2022-09-28: qty 20

## 2022-09-28 MED ORDER — OXYCODONE HCL 5 MG PO TABS
ORAL_TABLET | ORAL | Status: AC
  Administered 2022-09-28: 5 mg via ORAL
  Filled 2022-09-28: qty 1

## 2022-09-28 MED ORDER — MIDAZOLAM HCL 5 MG/5ML IJ SOLN
INTRAMUSCULAR | Status: DC | PRN
  Administered 2022-09-28: 2 mg via INTRAVENOUS

## 2022-09-28 MED ORDER — LACTATED RINGERS IV SOLN: INTRAVENOUS | Status: DC

## 2022-09-28 MED ORDER — DEXAMETHASONE SODIUM PHOSPHATE 10 MG/ML IJ SOLN
INTRAMUSCULAR | Status: DC | PRN
  Administered 2022-09-28: 10 mg via INTRAVENOUS

## 2022-09-28 MED ORDER — PHENYLEPHRINE 80 MCG/ML (10ML) SYRINGE FOR IV PUSH (FOR BLOOD PRESSURE SUPPORT)
PREFILLED_SYRINGE | INTRAVENOUS | Status: DC | PRN
  Administered 2022-09-28: 160 ug via INTRAVENOUS
  Administered 2022-09-28: 80 ug via INTRAVENOUS
  Administered 2022-09-28: 160 ug via INTRAVENOUS

## 2022-09-28 MED ORDER — ORAL CARE MOUTH RINSE: 15.0000 mL | Freq: Once | OROMUCOSAL | Status: AC

## 2022-09-28 MED ORDER — DROPERIDOL 2.5 MG/ML IJ SOLN: 0.6250 mg | Freq: Once | INTRAMUSCULAR | Status: DC | PRN

## 2022-09-28 MED ORDER — OXYCODONE HCL 10 MG PO TABS: 10.0000 mg | ORAL_TABLET | Freq: Four times a day (QID) | ORAL | 0 refills | Status: DC | PRN

## 2022-09-28 MED ORDER — OXYCODONE HCL 5 MG/5ML PO SOLN: 5.0000 mg | Freq: Once | ORAL | Status: AC | PRN

## 2022-09-28 MED ORDER — IOHEXOL 300 MG/ML  SOLN
INTRAMUSCULAR | Status: DC | PRN
  Administered 2022-09-28: 20 mL

## 2022-09-28 MED ORDER — CHLORHEXIDINE GLUCONATE 0.12 % MT SOLN
15.0000 mL | Freq: Once | OROMUCOSAL | Status: AC
  Administered 2022-09-28: 15 mL via OROMUCOSAL

## 2022-09-28 MED ORDER — FENTANYL CITRATE PF 50 MCG/ML IJ SOSY
25.0000 ug | PREFILLED_SYRINGE | INTRAMUSCULAR | Status: DC | PRN
  Administered 2022-09-28: 50 ug via INTRAVENOUS

## 2022-09-28 MED ORDER — 0.9 % SODIUM CHLORIDE (POUR BTL) OPTIME
TOPICAL | Status: DC | PRN
  Administered 2022-09-28: 1000 mL

## 2022-09-28 SURGICAL SUPPLY — 23 items
BAG URO CATCHER STRL LF (MISCELLANEOUS) ×2 IMPLANT
BASKET LASER NITINOL 1.9FR (BASKET) IMPLANT
BSKT STON RTRVL 120 1.9FR (BASKET) ×1
CATH URETL OPEN END 6FR 70 (CATHETERS) ×2 IMPLANT
CLOTH BEACON ORANGE TIMEOUT ST (SAFETY) ×2 IMPLANT
EXTRACTOR STONE 1.7FRX115CM (UROLOGICAL SUPPLIES) IMPLANT
GLOVE SURG LX STRL 7.5 STRW (GLOVE) ×2 IMPLANT
GOWN STRL REUS W/ TWL XL LVL3 (GOWN DISPOSABLE) ×2 IMPLANT
GOWN STRL REUS W/TWL XL LVL3 (GOWN DISPOSABLE) ×1
GUIDEWIRE ANG ZIPWIRE 038X150 (WIRE) ×2 IMPLANT
GUIDEWIRE STR DUAL SENSOR (WIRE) ×2 IMPLANT
KIT TURNOVER KIT A (KITS) IMPLANT
LASER FIB FLEXIVA PULSE ID 365 (Laser) IMPLANT
MANIFOLD NEPTUNE II (INSTRUMENTS) ×2 IMPLANT
PACK CYSTO (CUSTOM PROCEDURE TRAY) ×2 IMPLANT
SHEATH NAVIGATOR HD 11/13X28 (SHEATH) IMPLANT
SHEATH NAVIGATOR HD 11/13X36 (SHEATH) IMPLANT
STENT POLARIS 5FRX24 (STENTS) IMPLANT
TRACTIP FLEXIVA PULS ID 200XHI (Laser) IMPLANT
TRACTIP FLEXIVA PULSE ID 200 (Laser) ×1
TUBE PU 8FR 16IN ENFIT (TUBING) ×2 IMPLANT
TUBING CONNECTING 10 (TUBING) ×2 IMPLANT
TUBING UROLOGY SET (TUBING) ×2 IMPLANT

## 2022-09-28 NOTE — Op Note (Unsigned)
NAME: [name], [name] MEDICAL RECORD NO: [id number] ACCOUNT NO: [id number] DATE OF BIRTH: 10-09-1960 FACILITY: [facility] LOCATION: [facility] PHYSICIAN: Sebastian Ache, MD  Operative Report   DATE OF PROCEDURE: 09/28/2022  PREOPERATIVE DIAGNOSIS:  Right renal stones, hematuria, history of bacteriuria.  PROCEDURE PERFORMED: 1.  Cystoscopy, retrograde pyelogram interpretation. 2.  Left retrograde pyelogram interpretation. 3.  Right ureteroscopy with laser lithotripsy and stent exchange, first stage.  ESTIMATED BLOOD LOSS:  50 mL  COMPLICATIONS:  None.  SPECIMEN:  Right ureteral stone for compositional analysis.  FINDINGS: 1.  Unremarkable bladder and left retrograde pyelogram. 2.  Calcifications in the right lower pole with right retrograde pyelogram. 3.  Large volume right intrarenal stones, total volume approximately 1.5 cm2. 4.  Poor visualization due to hematuria on blood thinners during lithotripsy.  This will necessitate a second stage procedure to render stone free.  INDICATIONS:  The patient is a pleasant, but quite comorbid 63 year old lady with history of morbid obesity, status post gastric bypass surgery as well as hepatic failure, status post liver transplant.  She has known right greater than left intrarenal  stones on surveillance for a number of years.  She unfortunately developed significant hematuria and clots and hydronephrosis.  Imaging was quite concerning possible right intrarenal neoplasm versus this clot colic or bleeding from her stones.  She  has made sure at that time.  She initially temporized with stenting.  Plan for definitive ureteroscopy to rule out neoplasia and to help pass the stone free.  She has cleared her systemic infectious parameters and was treated with culture negative.  She  presents today for right ureteroscopy.  Informed consent was obtained and placed in medical record.  PROCEDURE DETAILS:  The patient being [name], procedure being cystoscopy,  bilateral retrogrades, right ureteroscopic stone manipulation was confirmed.  Procedure timeout was performed.  Intravenous antibiotics were administered.  General anesthesia  was induced.  The patient was placed into a low lithotomy position.  Sterile field was created, prepped and draped the patient's vagina, introitus, and proximal thighs using iodine.  Cystourethroscopy was performed using 21-French rigid cystoscope with  offset lens.  Inspection of urinary bladder revealed no diverticula, calcifications, papillary lesions.  Distal end of right ureteral stent was seen in situ.  It was grasped, was brought out in its entirety set aside for discard, inspected and intact.  Next, left  retrograde pyelogram was obtained.  Left retrograde pyelogram demonstrated single left ureter, single system left kidney.  No filling defects or narrowing noted.  Next, right retrograde pyelogram was obtained.  Right retrograde pyelogram demonstrated single right ureter, single system right kidney.  There was some fullness of the kidney without frank hydronephrosis and calcifications in the lower pole consistent with known stone.  A 0.038 ZIPwire was advanced  to lower pole, set aside as a safety wire.  An 8-French feeding tube placed in the urinary bladder for pressure release.  Next, semirigid ureteroscopy was performed to distal orifice of right ureter alongside a separate sensor working wire.  No mucosal  abnormalities found.  Next, a semirigid scope was exchanged for the short length ureteral access sheath to the level of proximal ureter using continuous fluoroscopic guidance and flexible digital ureteroscopy was performed of the proximal right ureter  and systematic inspection of the right kidney, multifocal fairly large volume stones in the lower pole.  Several foci total volume estimated to be approximately 5 cm.  Some of these were simple appearing while others were quite jagged multifaceted.  Fortunately there  was  no evidence of urothelial neoplasia whatsoever.  Given the acute angulation of the stones they were sequentially grasped with Escape basket and repositioned into an upper pole calix for less acute angulation.  Several small stones were amenable to  simple basketing alone.  Holmium laser energy then applied to stone using setting of 0.2 joules and 20 Hz.  Using a dusting technique estimated approximately 60-70% total stone volume was ablated and fragmented. During lithotripsy inspected very, very  careful technique, taking exquisite care to avoid mucosal energy.  There was some mucosal bleeding apparently from the irritation of stones and given her anticoagulation this became progressively more difficult in terms of visualization, fragments were  grasped with Escape basket removed sequentially as much as possible, however, due to the exquisitely poor visualization, I certainly cannot verify she was stone free and actually had suspicion that she still had approximately 20-30% of stone volume  remaining, given her propensity hematuria with urolithiasis I feel that a staged approach was most prudent with returning for second stage in several weeks with a goal of rendering her stone free.  As such, the access sheath and under continuous vision,  no significant mucosal abnormalities were found and a new 5 x 24 Polaris type stent was placed using fluoroscopic guidance.  Good proximal and distal planes were noted.  Procedure was terminated.  The patient tolerated the procedure well, no immediate  periprocedural complications.  The patient with postanesthesia care in stable condition.  Plan for discharge home.  We will schedule her second stage procedure several weeks pending her goals of treatment.   SHY D: 09/28/2022 2:08:56 pm T: 09/28/2022 3:54:00 pm  JOB: [id number]/ [id number]

## 2022-09-28 NOTE — Brief Op Note (Signed)
09/28/2022  2:01 PM  PATIENT:  [name]  62 y.o. female  PRE-OPERATIVE DIAGNOSIS:  RIGHT HYDRONEPHROIS HEMATURIA AND KIDNEY STONE  POST-OPERATIVE DIAGNOSIS:  RIGHT HYDRONEPHROIS HEMATURIA AND KIDNEY STONE  PROCEDURE:  Procedure(s) with comments: FIRST STAGGE CYSTOSCOPY BILATERAL RETROGRADES RIGHT URETEROSCOPY/HOLMIUM LASER/STENT EXCHANGE (Bilateral) - 75 MINS FOR CASE  SURGEON:  Surgeons and Role:    * Telly Broberg, Delbert Phenix., MD - Primary  PHYSICIAN ASSISTANT:   ASSISTANTS: none   ANESTHESIA:   general  EBL:  50mL   BLOOD ADMINISTERED:none  DRAINS: none   LOCAL MEDICATIONS USED:  NONE  SPECIMEN:  Source of Specimen:  left renal stone fragments  DISPOSITION OF SPECIMEN:   [facility] for compositional analysis  COUNTS:  YES  TOURNIQUET:  * No tourniquets in log *  DICTATION: .Other Dictation: Dictation Number [id number]  PLAN OF CARE: Discharge to home after PACU  PATIENT DISPOSITION:  PACU - hemodynamically stable.   Delay start of Pharmacological VTE agent (>24hrs) due to surgical blood loss or risk of bleeding: yes

## 2022-09-28 NOTE — Discharge Instructions (Signed)
1 - You may have urinary urgency (bladder spasms) and bloody urine on / off with stent in place. This is normal. ° °2 - Call MD or go to ER for fever >102, severe pain / nausea / vomiting not relieved by medications, or acute change in medical status ° °

## 2022-09-28 NOTE — Transfer of Care (Signed)
Immediate Anesthesia Transfer of Care Note  Patient: [name]  Procedure(s) Performed: FIRST STAGGE CYSTOSCOPY BILATERAL RETROGRADES RIGHT URETEROSCOPY/HOLMIUM LASER/STENT EXCHANGE (Bilateral)  Patient Location: PACU  Anesthesia Type:General  Level of Consciousness: sedated  Airway & Oxygen Therapy: Patient Spontanous Breathing and Patient connected to face mask oxygen  Post-op Assessment: Report given to RN and Post -op Vital signs reviewed and stable  Post vital signs: Reviewed and stable  Last Vitals:  Vitals Value Taken Time  BP    Temp    Pulse    Resp    SpO2      Last Pain:  Vitals:   09/28/22 1150  PainSc: 2          Complications: No notable events documented.

## 2022-09-28 NOTE — Anesthesia Procedure Notes (Signed)
Procedure Name: LMA Insertion Date/Time: 09/28/2022 12:53 PM  Performed by: Doran Clay, CRNAPre-anesthesia Checklist: Patient identified, Emergency Drugs available, Suction available and Patient being monitored Patient Re-evaluated:Patient Re-evaluated prior to induction Oxygen Delivery Method: Circle system utilized Preoxygenation: Pre-oxygenation with 100% oxygen Induction Type: IV induction LMA: LMA inserted LMA Size: 4.0 Tube type: Oral Number of attempts: 1 Placement Confirmation: positive ETCO2 Tube secured with: Tape Dental Injury: Teeth and Oropharynx as per pre-operative assessment

## 2022-09-28 NOTE — H&P (Signed)
[name] is an 62 y.o. female.    Chief Complaint: Pre-Op RIGHT Ureteroscopic Stone Manipulation  HPI:   1 - Recurrent Urolithiasis  Pre 2024 SWL x several (all pre liver Russell)   Recent Surveillance:  05/2022 - KUB - Rt upper 8mm, Rt lower 6mm stones (stable x years)  8/204 - CT - Rt renal sotnes, large renal clot. JJ stent placed.   2 - Occasional Cystitis -  h/o klebsiella sens gent, levoquin, RES others. She is immune supressed, KUB w/o overt obstructing stone.   PMH sig for liver failure/ Russell (follows [name]), gastric bypass, CAD/Stent/Plavix (follows [facility] Dr. Diona Browner) her PCP is Dr. Sherwood Gambler.   Today " [name]" is seen to proceed with Rt ureteroscopy for goal of Rt side sotne free and max r/o other etiology to renal bleeding. Most recent UCX negative, Cr 2's.   Past Medical History:  Diagnosis Date  . Anemia   . Anxiety   . Ascites   . Cancer (HCC)    numerous skin cancer  . CKD (chronic kidney disease) stage 4, GFR 15-29 ml/min (HCC)   . Coronary artery disease   . Depression   . Enteropathy 07/31/2015   Portal hypertensive enteropathy per capsule study, with stigmata of bleeding.  . Esophageal varices (HCC)   . Fibromyalgia   . Gastric ulcer 07/15/2015   EGD; no stigmata of bleeding  . GI bleed 07/17/2015  . Gram-negative bacteremia 12/08/2011  . Headache(784.0)   . Heart murmur   . History of cirrhosis   . History of hepatitis C - successfuly treated medically 07/14/2015  . History of kidney stones   . Myocardial infarction (HCC)   . Nephrolithiasis   . Portal vein thrombosis   . Retroperitoneal hemorrhage complicating cardiac catheterization, small 11/06/2020  . Right ureteral stone 12/07/2011  . S/P angioplasty CTO with stent  (DES) 11/04/20 of mid LAD 11/06/2020  . Sleep apnea   . Status post liver Russell (HCC) 08/2016   Natasha  . 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 Status post small bowel  Russell (HCC) 08/2016   Natasha  . 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 Type 2 diabetes mellitus (HCC)     Past Surgical History:  Procedure Laterality Date  . ABDOMINAL HYSTERECTOMY  2003  . CESAREAN SECTION  1995  . CHOLECYSTECTOMY  1987  . COLONOSCOPY    . COLONOSCOPY  10/21/2011   Procedure: COLONOSCOPY;  Surgeon: Malissa Hippo, MD;  Location[facility];  Service: Endoscopy;  Laterality: N/A;  245   . CORONARY CTO INTERVENTION N/A 11/04/2020   Procedure: CORONARY CTO INTERVENTION;  Surgeon: Swaziland, Peter M, MD;  Location: [facility] INVASIVE CV LAB;  Service: Cardiovascular;  Laterality: N/A;  . CYSTOSCOPY W/ RETROGRADES  12/08/2011   Procedure: CYSTOSCOPY WITH RETROGRADE PYELOGRAM;  Surgeon: Ky Barban, MD;  Location: [facility];  Service: Urology;  Laterality: Right;  . CYSTOSCOPY WITH RETROGRADE PYELOGRAM, URETEROSCOPY AND STENT PLACEMENT Right 09/16/2022   Procedure: CYSTOSCOPY WITH RETROGRADE PYELOGRAM AND RIGHT STENT PLACEMENT;  Surgeon: Crist Fat, MD;  Location: [facility];  Service: Urology;  Laterality: Right;  . CYSTOSCOPY WITH STENT PLACEMENT  12/08/2011   Procedure: CYSTOSCOPY WITH STENT PLACEMENT;  Surgeon: Ky Barban, MD;  Location: [facility];  Service: Urology;  Laterality: Right;  . DILATION AND CURETTAGE OF UTERUS    . ESOPHAGOGASTRODUODENOSCOPY N/A 02/19/2014   Procedure: ESOPHAGOGASTRODUODENOSCOPY (EGD);  Surgeon: Ok Anis  Cline Crock, MD;  Location: [facility];  Service: Endoscopy;  Laterality: N/A;  100  . ESOPHAGOGASTRODUODENOSCOPY N/A 07/15/2015   Procedure: ESOPHAGOGASTRODUODENOSCOPY (EGD);  Surgeon: Malissa Hippo, MD;  Location[facility];  Service: Endoscopy;  Laterality: N/A;  . ESOPHAGOGASTRODUODENOSCOPY N/A 07/29/2015   Procedure: ESOPHAGOGASTRODUODENOSCOPY (EGD);  Surgeon: Malissa Hippo, MD;  Location[facility];  Service: Endoscopy;  Laterality: N/A;  . GASTRIC BYPASS  ~ 1995  . HERNIA REPAIR  04/14/11   "one in my  bellybutton" (11/07/2012)  . INGUINAL HERNIA REPAIR Right    "maybe 2" (11/07/2012)  . KNEE ARTHROSCOPY Right   . LEFT HEART CATH AND CORONARY ANGIOGRAPHY N/A 09/25/2020   Procedure: LEFT HEART CATH AND CORONARY ANGIOGRAPHY;  Surgeon: Tonny Bollman, MD;  Location: [facility] INVASIVE CV LAB;  Service: Cardiovascular;  Laterality: N/A;  . LITHOTRIPSY     "several times" (11/07/2012)  . OPEN REDUCTION INTERNAL FIXATION (ORIF) DISTAL RADIAL FRACTURE Right 11/07/2012   Procedure: OPEN REDUCTION INTERNAL FIXATION (ORIF) RIGHT DISTAL RADIAL FRACTURE;  Surgeon: Sharma Covert, MD;  Location: [facility] OR;  Service: Orthopedics;  Laterality: Right;  . ORIF DISTAL RADIUS FRACTURE Right 11/07/2012  . OTHER SURGICAL HISTORY  08/14/2016   5 organ Russell- liver, stomach, pancreas, small and large intestine  . UPPER GASTROINTESTINAL ENDOSCOPY      Family History  Problem Relation Age of Onset  . Dementia Mother   . Heart disease Father   . Liver disease Father   . Hypertension Father   . Diabetes Father   . Dementia Father   . Hypertension Brother    Social History:  reports that she quit smoking about 31 years ago. Her smoking use included cigarettes. She started smoking about 36 years ago. She has a 2.5 pack-year smoking history. She has never used smokeless tobacco. She reports that she does not drink alcohol and does not use drugs.  Allergies:  Allergies  Allergen Reactions  . Ancef [Cefazolin Sodium] Other (See Comments)    Reaction:  Vaginal and mouth blisters  . Nucynta [Tapentadol]     Pt unsure of allergy  . Cefazolin Rash    Blisters in mouth and vagina   . Gadobenate Itching, Nausea And Vomiting and Rash  . Iodinated Contrast Media Itching, Nausea And Vomiting and Rash  . Tape Dermatitis and Rash    Adhesive on Surgical Tape, blisters skin     No medications prior to admission.    No results found for this or any previous visit (from the past 48 hour(s)). No results found.  Review of  Systems  There were no vitals taken for this visit. Physical Exam Constitutional:      Appearance: She is normal weight.     Comments: Appears older than stated age, at baseline, pleasant.   Eyes:     Pupils: Pupils are equal, round, and reactive to light.  Cardiovascular:     Rate and Rhythm: Normal rate.  Abdominal:     Comments: Stable obesity / stigmata of prior weight loss.   Genitourinary:    Comments: No CVAT at present.  Musculoskeletal:        General: Normal range of motion.     Cervical back: Normal range of motion.  Skin:    General: Skin is warm.  Neurological:     General: No focal deficit present.     Mental Status: She is alert.  Psychiatric:        Mood and Affect:  Mood normal.     Assessment/Plan  Proceed as planned with cysto, Rt retrograde / ureteroscopy / laser / stent exchange.   Loletta Parish., MD 09/28/2022, 8:20 AM

## 2022-09-28 NOTE — Anesthesia Postprocedure Evaluation (Signed)
Anesthesia Post Note  Patient: [name]  Procedure(s) Performed: FIRST STAGGE CYSTOSCOPY BILATERAL RETROGRADES RIGHT URETEROSCOPY/HOLMIUM LASER/STENT EXCHANGE (Bilateral)     Patient location during evaluation: PACU Anesthesia Type: General Level of consciousness: awake and alert Pain management: pain level controlled Vital Signs Assessment: post-procedure vital signs reviewed and stable Respiratory status: spontaneous breathing, nonlabored ventilation, respiratory function stable and patient connected to nasal cannula oxygen Cardiovascular status: blood pressure returned to baseline and stable Postop Assessment: no apparent nausea or vomiting Anesthetic complications: no   No notable events documented.  Last Vitals:  Vitals:   09/28/22 1500 09/28/22 1515  BP: (!) 120/91 (!) 140/88  Pulse: 69 75  Resp: 16 15  Temp: (!) 36.3 C (!) 36.3 C  SpO2: 100% 95%    Last Pain:  Vitals:   09/28/22 1515  PainSc: 4                  Jenica Costilow Bryna Colander

## 2022-09-29 ENCOUNTER — Encounter (HOSPITAL_COMMUNITY): Payer: Self-pay | Admitting: Urology

## 2022-09-30 DIAGNOSIS — N202 Calculus of kidney with calculus of ureter: Secondary | ICD-10-CM | POA: Diagnosis not present

## 2022-09-30 DIAGNOSIS — N3000 Acute cystitis without hematuria: Secondary | ICD-10-CM | POA: Diagnosis not present

## 2022-09-30 DIAGNOSIS — R8271 Bacteriuria: Secondary | ICD-10-CM | POA: Diagnosis not present

## 2022-10-04 ENCOUNTER — Other Ambulatory Visit: Payer: Self-pay | Admitting: Urology

## 2022-10-06 ENCOUNTER — Encounter (HOSPITAL_COMMUNITY): Payer: Self-pay

## 2022-10-06 NOTE — Patient Instructions (Signed)
SURGICAL WAITING ROOM VISITATION Patients having surgery or a procedure may have no more than 2 support people in the waiting area - these visitors may rotate.    Children under the age of 30 must have an adult with them who is not the patient.  If the patient needs to stay at the hospital during part of their recovery, the visitor guidelines for inpatient rooms apply. Pre-op nurse will coordinate an appropriate time for 1 support person to accompany patient in pre-op.  This support person may not rotate.    Please refer to the [facility] website for the visitor guidelines for Inpatients (after your surgery is over and you are in a regular room).       Your procedure is scheduled on: 10-12-22   Report to [facility] Main Entrance    Report to admitting at 11:00 AM   Call this number if you have problems the morning of surgery [phone]   Do not eat food or drink liquids:After Midnight.          If you have questions, please contact your surgeon's office.   FOLLOW ANY ADDITIONAL PRE OP INSTRUCTIONS YOU RECEIVED FROM YOUR SURGEON'S OFFICE!!!     Oral Hygiene is also important to reduce your risk of infection.                                    Remember - BRUSH YOUR TEETH THE MORNING OF SURGERY WITH YOUR REGULAR TOOTHPASTE   Do NOT smoke after Midnight   Take these medicines the morning of surgery with A SIP OF WATER:   Bupropion  Ezetimibe  Pravastatin  Mycophenolate  Atenolol  Tamsulosin   Hold Plavix 5 and Aspirin 5 days before surgery   Stop all vitamins and herbal supplements 7 days before surgery                             You may not have any metal on your body including hair pins, jewelry, and body piercing             Do not wear make-up, lotions, powders, perfumes or deodorant  Do not wear nail polish including gel and S&S, artificial/acrylic nails, or any other type of covering on natural nails including finger and toenails. If you have artificial  nails, gel coating, etc. that needs to be removed by a nail salon please have this removed prior to surgery or surgery may need to be canceled/ delayed if the surgeon/ anesthesia feels like they are unable to be safely monitored.   Do not shave  48 hours prior to surgery.               Do not bring valuables to the hospital. [facility] IS NOT RESPONSIBLE   FOR VALUABLES.   Contacts, dentures or bridgework may not be worn into surgery.   DO NOT BRING YOUR HOME MEDICATIONS TO THE HOSPITAL. PHARMACY WILL DISPENSE MEDICATIONS LISTED ON YOUR MEDICATION LIST TO YOU DURING YOUR ADMISSION IN THE HOSPITAL!    Patients discharged on the day of surgery will not be allowed to drive home.  Someone NEEDS to stay with you for the first 24 hours after anesthesia.   Special Instructions: Bring a copy of your healthcare power of attorney and living will documents the day of surgery if you haven't scanned them before.  Please read over the following fact sheets you were given: IF YOU HAVE QUESTIONS ABOUT YOUR PRE-OP INSTRUCTIONS PLEASE CALL [phone]   If you received a COVID test during your pre-op visit  it is requested that you wear a mask when out in public, stay away from anyone that may not be feeling well and notify your surgeon if you develop symptoms. If you test positive for Covid or have been in contact with anyone that has tested positive in the last 10 days please notify you surgeon.  [facility] - Preparing for Surgery Before surgery, you can play an important role.  Because skin is not sterile, your skin needs to be as free of germs as possible.  You can reduce the number of germs on your skin by washing with CHG (chlorahexidine gluconate) soap before surgery.  CHG is an antiseptic cleaner which kills germs and bonds with the skin to continue killing germs even after washing. Please DO NOT use if you have an allergy to CHG or antibacterial soaps.  If your skin becomes  reddened/irritated stop using the CHG and inform your nurse when you arrive at Short Stay. Do not shave (including legs and underarms) for at least 48 hours prior to the first CHG shower.  You may shave your face/neck.  Please follow these instructions carefully:  1.  Shower with CHG Soap the night before surgery and the  morning of surgery.  2.  If you choose to wash your hair, wash your hair first as usual with your normal  shampoo.  3.  After you shampoo, rinse your hair and body thoroughly to remove the shampoo.                             4.  Use CHG as you would any other liquid soap.  You can apply chg directly to the skin and wash.  Gently with a scrungie or clean washcloth.  5.  Apply the CHG Soap to your body ONLY FROM THE NECK DOWN.   Do   not use on face/ open                           Wound or open sores. Avoid contact with eyes, ears mouth and   genitals (private parts).                       Wash face,  Genitals (private parts) with your normal soap.             6.  Wash thoroughly, paying special attention to the area where your    surgery  will be performed.  7.  Thoroughly rinse your body with warm water from the neck down.  8.  DO NOT shower/wash with your normal soap after using and rinsing off the CHG Soap.                9.  Pat yourself dry with a clean towel.            10.  Wear clean pajamas.            11.  Place clean sheets on your bed the night of your first shower and do not  sleep with pets. Day of Surgery : Do not apply any lotions/deodorants the morning of surgery.  Please wear clean clothes to the hospital/surgery center.  FAILURE TO  FOLLOW THESE INSTRUCTIONS MAY RESULT IN THE CANCELLATION OF YOUR SURGERY  PATIENT SIGNATURE_________________________________  NURSE SIGNATURE__________________________________  ________________________________________________________________________

## 2022-10-06 NOTE — Progress Notes (Addendum)
Patient phoned to give updated information on surgery second stage.    Date of Surgery - 10-12-22  Arrival Time - check in at admitting at 11:00 am.    NPO Status - patient reminded to not eat solid food or drink liquids after midnight the night before surgery.  Medications morning of surgery - Bupropion, Ezetimibe, Pravastatin, Mycophenolate, Atenolol, Tamsulosin with a sip of water.    No change in medical history, allergies per patient since last surgery on 09/28/22.  Last dose of Plavix and Aspirin 10/06/22. Patient states that she didn't have to hold Phentermine prior to surgery.   Transportation home - husband, Rashema Peper, [phone]  All questions answered and patient stated understanding.  Surgery instructions sent through MyChart to patient.

## 2022-10-11 NOTE — Anesthesia Preprocedure Evaluation (Addendum)
Anesthesia Evaluation  Patient identified by MRN, date of birth, ID band Patient awake    Reviewed: Allergy & Precautions, NPO status , Patient's Chart, lab work & pertinent test results, reviewed documented beta blocker date and time   Airway Mallampati: II  TM Distance: >3 FB Neck ROM: Full    Dental no notable dental hx. (+) Teeth Intact   Pulmonary sleep apnea , former smoker   Pulmonary exam normal breath sounds clear to auscultation       Cardiovascular + CAD, + Past MI and + Cardiac Stents (DES 2022)  Normal cardiovascular exam Rhythm:Regular Rate:Normal     Neuro/Psych  Headaches  Anxiety Depression       GI/Hepatic PUD,,,(+) Hepatitis -S/P Liver, pancreas and small Bowel transplan 2018 at [facility]   Endo/Other    Renal/GU Renal InsufficiencyRenal disease     Musculoskeletal  (+)  Fibromyalgia -  Abdominal   Peds  Hematology  (+) Blood dyscrasia, anemia Lab Results      Component                Value               Date                                    HCT                      29.7 (L)            09/23/2022              Anesthesia Other Findings All: ancef, nycenta  Reproductive/Obstetrics                             Anesthesia Physical Anesthesia Plan  ASA: 3  Anesthesia Plan: General   Post-op Pain Management: Tylenol PO (pre-op)*   Induction: Intravenous  PONV Risk Score and Plan: 4 or greater and Ondansetron, Dexamethasone and Treatment may vary due to age or medical condition  Airway Management Planned: LMA  Additional Equipment: None  Intra-op Plan:   Post-operative Plan: Extubation in OR  Informed Consent: I have reviewed the patients History and Physical, chart, labs and discussed the procedure including the risks, benefits and alternatives for the proposed anesthesia with the patient or authorized representative who has indicated his/her understanding and  acceptance.     Dental advisory given  Plan Discussed with: CRNA and Anesthesiologist  Anesthesia Plan Comments:        Anesthesia Quick Evaluation

## 2022-10-12 ENCOUNTER — Encounter (HOSPITAL_COMMUNITY)
Admission: RE | Disposition: A | Discharge status: Home / Self Care | Payer: Self-pay | Source: Home / Self Care | Attending: Urology

## 2022-10-12 ENCOUNTER — Encounter (HOSPITAL_COMMUNITY): Payer: Self-pay | Admitting: Urology

## 2022-10-12 ENCOUNTER — Ambulatory Visit (HOSPITAL_COMMUNITY): Payer: Medicare Other

## 2022-10-12 ENCOUNTER — Ambulatory Visit (HOSPITAL_COMMUNITY)
Admission: RE | Admit: 2022-10-12 | Discharge: 2022-10-12 | Disposition: A | Discharge status: Home / Self Care | Payer: Medicare Other | Attending: Urology | Admitting: Urology

## 2022-10-12 ENCOUNTER — Ambulatory Visit (HOSPITAL_BASED_OUTPATIENT_CLINIC_OR_DEPARTMENT_OTHER): Payer: Medicare Other | Admitting: Anesthesiology

## 2022-10-12 ENCOUNTER — Ambulatory Visit (HOSPITAL_COMMUNITY): Payer: Medicare Other | Admitting: Anesthesiology

## 2022-10-12 DIAGNOSIS — Z944 Liver transplant status: Secondary | ICD-10-CM | POA: Insufficient documentation

## 2022-10-12 DIAGNOSIS — D649 Anemia, unspecified: Secondary | ICD-10-CM

## 2022-10-12 DIAGNOSIS — Z955 Presence of coronary angioplasty implant and graft: Secondary | ICD-10-CM | POA: Insufficient documentation

## 2022-10-12 DIAGNOSIS — Z87891 Personal history of nicotine dependence: Secondary | ICD-10-CM | POA: Diagnosis not present

## 2022-10-12 DIAGNOSIS — Z6832 Body mass index (BMI) 32.0-32.9, adult: Secondary | ICD-10-CM | POA: Diagnosis not present

## 2022-10-12 DIAGNOSIS — N184 Chronic kidney disease, stage 4 (severe): Secondary | ICD-10-CM | POA: Insufficient documentation

## 2022-10-12 DIAGNOSIS — E1122 Type 2 diabetes mellitus with diabetic chronic kidney disease: Secondary | ICD-10-CM | POA: Diagnosis not present

## 2022-10-12 DIAGNOSIS — Z8619 Personal history of other infectious and parasitic diseases: Secondary | ICD-10-CM | POA: Diagnosis not present

## 2022-10-12 DIAGNOSIS — I252 Old myocardial infarction: Secondary | ICD-10-CM | POA: Diagnosis not present

## 2022-10-12 DIAGNOSIS — Z9483 Pancreas transplant status: Secondary | ICD-10-CM | POA: Diagnosis not present

## 2022-10-12 DIAGNOSIS — N200 Calculus of kidney: Secondary | ICD-10-CM | POA: Diagnosis not present

## 2022-10-12 DIAGNOSIS — G4730 Sleep apnea, unspecified: Secondary | ICD-10-CM | POA: Insufficient documentation

## 2022-10-12 DIAGNOSIS — M797 Fibromyalgia: Secondary | ICD-10-CM | POA: Insufficient documentation

## 2022-10-12 DIAGNOSIS — E669 Obesity, unspecified: Secondary | ICD-10-CM | POA: Diagnosis not present

## 2022-10-12 DIAGNOSIS — Z9884 Bariatric surgery status: Secondary | ICD-10-CM | POA: Insufficient documentation

## 2022-10-12 DIAGNOSIS — K769 Liver disease, unspecified: Secondary | ICD-10-CM

## 2022-10-12 DIAGNOSIS — N132 Hydronephrosis with renal and ureteral calculous obstruction: Secondary | ICD-10-CM | POA: Diagnosis not present

## 2022-10-12 DIAGNOSIS — F418 Other specified anxiety disorders: Secondary | ICD-10-CM

## 2022-10-12 DIAGNOSIS — D691 Qualitative platelet defects: Secondary | ICD-10-CM | POA: Insufficient documentation

## 2022-10-12 DIAGNOSIS — I2510 Atherosclerotic heart disease of native coronary artery without angina pectoris: Secondary | ICD-10-CM | POA: Insufficient documentation

## 2022-10-12 DIAGNOSIS — Z7901 Long term (current) use of anticoagulants: Secondary | ICD-10-CM | POA: Insufficient documentation

## 2022-10-12 HISTORY — PX: CYSTOSCOPY WITH RETROGRADE PYELOGRAM, URETEROSCOPY AND STENT PLACEMENT: SHX5789

## 2022-10-12 LAB — CBC
HCT: 29.5 % — ABNORMAL LOW (ref 36.0–46.0)
Hemoglobin: 8.5 g/dL — ABNORMAL LOW (ref 12.0–15.0)
MCH: 29.1 pg (ref 26.0–34.0)
MCHC: 28.8 g/dL — ABNORMAL LOW (ref 30.0–36.0)
MCV: 101 fL — ABNORMAL HIGH (ref 80.0–100.0)
Platelets: 301 10*3/uL (ref 150–400)
RBC: 2.92 MIL/uL — ABNORMAL LOW (ref 3.87–5.11)
RDW: 13.1 % (ref 11.5–15.5)
WBC: 8.2 10*3/uL (ref 4.0–10.5)
nRBC: 0 % (ref 0.0–0.2)

## 2022-10-12 LAB — COMPREHENSIVE METABOLIC PANEL
ALT: 34 U/L (ref 0–44)
AST: 27 U/L (ref 15–41)
Albumin: 3 g/dL — ABNORMAL LOW (ref 3.5–5.0)
Alkaline Phosphatase: 79 U/L (ref 38–126)
Anion gap: 7 (ref 5–15)
BUN: 25 mg/dL — ABNORMAL HIGH (ref 8–23)
CO2: 22 mmol/L (ref 22–32)
Calcium: 8.4 mg/dL — ABNORMAL LOW (ref 8.9–10.3)
Chloride: 109 mmol/L (ref 98–111)
Creatinine, Ser: 2.44 mg/dL — ABNORMAL HIGH (ref 0.44–1.00)
GFR, Estimated: 22 mL/min — ABNORMAL LOW (ref >60)
Glucose, Bld: 125 mg/dL — ABNORMAL HIGH (ref 70–99)
Potassium: 4.9 mmol/L (ref 3.5–5.1)
Sodium: 138 mmol/L (ref 135–145)
Total Bilirubin: 0.3 mg/dL (ref 0.3–1.2)
Total Protein: 6.4 g/dL — ABNORMAL LOW (ref 6.5–8.1)

## 2022-10-12 SURGERY — CYSTOURETEROSCOPY, WITH RETROGRADE PYELOGRAM AND STENT INSERTION
Anesthesia: General | Laterality: Right

## 2022-10-12 MED ORDER — PROPOFOL 10 MG/ML IV BOLUS
INTRAVENOUS | Status: DC | PRN
  Administered 2022-10-12: 160 mg via INTRAVENOUS

## 2022-10-12 MED ORDER — ACETAMINOPHEN 10 MG/ML IV SOLN
1000.0000 mg | Freq: Once | INTRAVENOUS | Status: DC | PRN
  Administered 2022-10-12: 1000 mg via INTRAVENOUS

## 2022-10-12 MED ORDER — OXYCODONE HCL 5 MG PO TABS
ORAL_TABLET | ORAL | Status: AC
  Filled 2022-10-12: qty 1

## 2022-10-12 MED ORDER — ORAL CARE MOUTH RINSE: 15.0000 mL | Freq: Once | OROMUCOSAL | Status: AC

## 2022-10-12 MED ORDER — ONDANSETRON HCL 4 MG/2ML IJ SOLN
INTRAMUSCULAR | Status: AC
  Filled 2022-10-12: qty 2

## 2022-10-12 MED ORDER — GENTAMICIN SULFATE 40 MG/ML IJ SOLN
5.0000 mg/kg | INTRAVENOUS | Status: AC
  Administered 2022-10-12: 350 mg via INTRAVENOUS
  Filled 2022-10-12: qty 8.75

## 2022-10-12 MED ORDER — ONDANSETRON HCL 4 MG/2ML IJ SOLN: 4.0000 mg | Freq: Once | INTRAMUSCULAR | Status: DC | PRN

## 2022-10-12 MED ORDER — CHLORHEXIDINE GLUCONATE 0.12 % MT SOLN
15.0000 mL | Freq: Once | OROMUCOSAL | Status: AC
  Administered 2022-10-12: 15 mL via OROMUCOSAL

## 2022-10-12 MED ORDER — FENTANYL CITRATE (PF) 100 MCG/2ML IJ SOLN
INTRAMUSCULAR | Status: DC | PRN
  Administered 2022-10-12 (×2): 50 ug via INTRAVENOUS

## 2022-10-12 MED ORDER — ACETAMINOPHEN 10 MG/ML IV SOLN
INTRAVENOUS | Status: AC
  Filled 2022-10-12: qty 100

## 2022-10-12 MED ORDER — PROPOFOL 10 MG/ML IV BOLUS
INTRAVENOUS | Status: AC
  Filled 2022-10-12: qty 20

## 2022-10-12 MED ORDER — ONDANSETRON HCL 4 MG/2ML IJ SOLN
INTRAMUSCULAR | Status: DC | PRN
  Administered 2022-10-12: 4 mg via INTRAVENOUS

## 2022-10-12 MED ORDER — DEXAMETHASONE SODIUM PHOSPHATE 10 MG/ML IJ SOLN
INTRAMUSCULAR | Status: DC | PRN
  Administered 2022-10-12: 10 mg via INTRAVENOUS

## 2022-10-12 MED ORDER — LACTATED RINGERS IV SOLN: INTRAVENOUS | Status: DC

## 2022-10-12 MED ORDER — SODIUM CHLORIDE 0.9 % IR SOLN
Status: DC | PRN
  Administered 2022-10-12: 3000 mL via INTRAVESICAL

## 2022-10-12 MED ORDER — MIDAZOLAM HCL 2 MG/2ML IJ SOLN
INTRAMUSCULAR | Status: DC | PRN
  Administered 2022-10-12: 2 mg via INTRAVENOUS

## 2022-10-12 MED ORDER — LIDOCAINE 2% (20 MG/ML) 5 ML SYRINGE
INTRAMUSCULAR | Status: DC | PRN
  Administered 2022-10-12: 100 mg via INTRAVENOUS

## 2022-10-12 MED ORDER — OXYCODONE HCL 5 MG/5ML PO SOLN: 5.0000 mg | Freq: Once | ORAL | Status: AC | PRN

## 2022-10-12 MED ORDER — OXYCODONE HCL 5 MG PO TABS
5.0000 mg | ORAL_TABLET | Freq: Once | ORAL | Status: AC | PRN
  Administered 2022-10-12: 5 mg via ORAL

## 2022-10-12 MED ORDER — DROPERIDOL 2.5 MG/ML IJ SOLN: 0.6250 mg | Freq: Once | INTRAMUSCULAR | Status: DC | PRN

## 2022-10-12 MED ORDER — OXYCODONE HCL 10 MG PO TABS: 10.0000 mg | ORAL_TABLET | Freq: Four times a day (QID) | ORAL | 0 refills | Status: AC | PRN

## 2022-10-12 MED ORDER — IOHEXOL 300 MG/ML  SOLN
INTRAMUSCULAR | Status: DC | PRN
  Administered 2022-10-12: 20 mL

## 2022-10-12 MED ORDER — HYDROMORPHONE HCL 1 MG/ML IJ SOLN
INTRAMUSCULAR | Status: AC
  Administered 2022-10-12: 0.5 mg via INTRAVENOUS
  Filled 2022-10-12: qty 1

## 2022-10-12 MED ORDER — HYDROMORPHONE HCL 1 MG/ML IJ SOLN
0.2500 mg | INTRAMUSCULAR | Status: DC | PRN
  Administered 2022-10-12: 0.5 mg via INTRAVENOUS

## 2022-10-12 MED ORDER — DEXAMETHASONE SODIUM PHOSPHATE 10 MG/ML IJ SOLN
INTRAMUSCULAR | Status: AC
  Filled 2022-10-12: qty 1

## 2022-10-12 MED ORDER — MIDAZOLAM HCL 2 MG/2ML IJ SOLN
INTRAMUSCULAR | Status: AC
  Filled 2022-10-12: qty 2

## 2022-10-12 MED ORDER — FENTANYL CITRATE (PF) 100 MCG/2ML IJ SOLN
INTRAMUSCULAR | Status: AC
  Filled 2022-10-12: qty 2

## 2022-10-12 SURGICAL SUPPLY — 24 items
BAG URO CATCHER STRL LF (MISCELLANEOUS) ×2 IMPLANT
BASKET LASER NITINOL 1.9FR (BASKET) IMPLANT
BASKET STONE NCOMPASS (UROLOGICAL SUPPLIES) IMPLANT
BSKT STON RTRVL 120 1.9FR (BASKET)
CATH URETL OPEN END 6FR 70 (CATHETERS) ×2 IMPLANT
CLOTH BEACON ORANGE TIMEOUT ST (SAFETY) ×2 IMPLANT
EXTRACTOR STONE 1.7FRX115CM (UROLOGICAL SUPPLIES) IMPLANT
GLOVE SURG LX STRL 7.5 STRW (GLOVE) ×2 IMPLANT
GOWN STRL REUS W/ TWL XL LVL3 (GOWN DISPOSABLE) ×2 IMPLANT
GOWN STRL REUS W/TWL XL LVL3 (GOWN DISPOSABLE) ×1
GUIDEWIRE ANG ZIPWIRE 038X150 (WIRE) ×2 IMPLANT
GUIDEWIRE STR DUAL SENSOR (WIRE) ×2 IMPLANT
KIT TURNOVER KIT A (KITS) IMPLANT
LASER FIB FLEXIVA PULSE ID 365 (Laser) IMPLANT
MANIFOLD NEPTUNE II (INSTRUMENTS) ×2 IMPLANT
PACK CYSTO (CUSTOM PROCEDURE TRAY) ×2 IMPLANT
SHEATH NAVIGATOR HD 11/13X28 (SHEATH) IMPLANT
SHEATH NAVIGATOR HD 11/13X36 (SHEATH) IMPLANT
STENT POLARIS 5FRX22 (STENTS) IMPLANT
TRACTIP FLEXIVA PULS ID 200XHI (Laser) IMPLANT
TRACTIP FLEXIVA PULSE ID 200 (Laser)
TUBE PU 8FR 16IN ENFIT (TUBING) ×2 IMPLANT
TUBING CONNECTING 10 (TUBING) ×2 IMPLANT
TUBING UROLOGY SET (TUBING) ×2 IMPLANT

## 2022-10-12 NOTE — Op Note (Unsigned)
NAME: [name], [name] MEDICAL RECORD NO: [id number] ACCOUNT NO: [id number] DATE OF BIRTH: [birth year] FACILITY: [facility] LOCATION: [facility] PHYSICIAN: Sebastian Ache, MD  Operative Report   DATE OF PROCEDURE: 10/12/2022  PREOPERATIVE DIAGNOSIS:  Residual right renal stone following first stage procedure.  PROCEDURE PERFORMED:   1.  Cystoscopy with right retrograde pyelogram interpretation 2.  Right ureteroscopy with basketing of stones, second stage. 3.  Exchange of right ureteral stent.  ESTIMATED BLOOD LOSS:  Nil.  COMPLICATIONS:  None.  SPECIMEN:  Small volume residual right renal stone and clot for discard.  FINDINGS:   1.  Significant interval passage of renal stone fragments and clot. 2.  Complete resolution of all accessible stone fragments larger than one-third mm following basket extraction. 3.  Successful replacement of right ureteral stent, proximal end in the renal pelvis, distal end in urinary bladder, with tether.  INDICATIONS:  The patient is a 62 year old lady with significant comorbidity, status post liver transplant, extreme obesity, gastric bypass, who was found on workup of flank pain, hematuria to have a large right ureteral stone with significant volume  renal stones.  She is on blood thinners and has platelet dysfunction.  She underwent stenting at that time as a temporizing measure.  Options were discussed and she wished to proceed with ureteroscopy with goal of stone free.  She also has a significant  volume of right renal clot from her stone and coagulopathy.  She underwent first stage procedure on 09/28/2022 as estimated that the vast majority of her stone was fragmented and retrieved.  However, due to large volume stone and very poor visualization  given her coagulopathy, so that a second stage procedure be warranted to verify and achieve her goal of stone free status and she presents for this today.  Informed consent was obtained and placed in medical  record.  PROCEDURE DETAILS:  The patient being verified, procedure being right second stage ureteroscopic stone manipulation was confirmed.  Procedure timeout was performed.  Intravenous antibiotics were administered.  General anesthesia was induced.  The patient  was placed into a low lithotomy position.  Sterile field was created, prepped and draped the patient's vagina, introitus, and proximal thighs using iodine.  Cystourethroscopy was performed using 21-French rigid cystoscope with offset lens.  Inspection  of urinary bladder revealed no diverticula, calcifications or papillary lesions.  Distal end of right ureteral stent was seen in situ was grasped, brought to the urethral meatus.  A 0.038 ZIPwire was advanced. Stent was exchanged for open-ended catheter  and right retrograde pyelogram was obtained.  Right retrograde pyelogram demonstrated single right ureter, single system right kidney.  No filling defects or narrowing noted.  There was significant interval resolution of most of the intrarenal clot.  This was quite favorable.  A ZIPwire was once  again advanced set aside as a safety wire.  An 8-French feeding tube placed in the urinary bladder for pressure release and semirigid ureteroscopy performed of the distal orifice right ureter alongside as a separate sensor working wire.  No mucosal  abnormalities were found.  Semirigid scope was then exchanged for a short length ureteral access sheath to the level of proximal ureter using continuous fluoroscopic guidance and flexible digital ureteroscopy was performed of the proximal right ureter  and systematic inspection of the right kidney revealed all calices x3.  There was significant interval passage of vast majority of residual small stone and clot.  This greatly improved visualization versus for first stage procedure.  Using an irrigation,  sweeping technique, the small residual stone fragments and clot were repositioned into an upper pole calix  to allow for less acute angulation and sequential basketing was performed using an [employer] basket with a net technique such that all stone  fragments larger than one-third mm or clots larger than one-third mm had been removed.  I was quite happy with the completeness of this.  Following the basketing there is complete resolution of all accessible stone fragments larger than one-third mm.   Excellent hemostasis, no evidence of any perforation.  Access sheath was removed under continuous vision, no significant mucosal abnormalities were found.  Given her coagulopathy and access sheath usage, it was felt that brief interval stenting with  tethered stent would be most prudent such, a new 5 x 22 Polaris type stent was carefully placed using fluoroscopic guidance.  Good proximal and distal plane were noted.  Tether was left in place, trimmed to length, tucked per vagina and the procedure was  terminated.  The patient tolerated procedure well, no immediate periprocedural complications.  The patient was taken to postanesthesia care unit in stable condition.  Plan for discharge home.   PUS D: 10/12/2022 2:13:25 pm T: 10/12/2022 5:34:00 pm  JOB: [id number]/ [id number]

## 2022-10-12 NOTE — Discharge Instructions (Addendum)
1 - You may have urinary urgency (bladder spasms) and bloody urine on / off with stent in place. This is normal.  2 - Remove tethered stent on Friday morning at home by pulling on string, then blue-white plastic tubing, and disarding. Office is open Friday if problems arise .  3 - Call MD or go to ER for fever >102, severe pain / nausea / vomiting not relieved by medications, or acute change in medical status

## 2022-10-12 NOTE — Transfer of Care (Signed)
Immediate Anesthesia Transfer of Care Note  Patient: [name]  Procedure(s) Performed: CYSTOSCOPY WITH RIGHT RETROGRADE, URETEROSCOPY, BASKETING OF STONE  AND STENT EXCHANGE (Right)  Patient Location: PACU  Anesthesia Type:General  Level of Consciousness: awake, alert , and oriented  Airway & Oxygen Therapy: Patient Spontanous Breathing and Patient connected to face mask oxygen  Post-op Assessment: Report given to RN and Post -op Vital signs reviewed and stable  Post vital signs: Reviewed and stable  Last Vitals:  Vitals Value Taken Time  BP 147/104 10/12/22 1415  Temp    Pulse 67 10/12/22 1416  Resp 11 10/12/22 1416  SpO2 95 % 10/12/22 1416  Vitals shown include unfiled device data.  Last Pain:  Vitals:   10/12/22 1147  TempSrc:   PainSc: 4       Patients Stated Pain Goal: 1 (10/12/22 1147)  Complications: No notable events documented.

## 2022-10-12 NOTE — Brief Op Note (Signed)
10/12/2022  2:07 PM  PATIENT:  [name]  62 y.o. female  PRE-OPERATIVE DIAGNOSIS:  RIGHT RENAL STONE  POST-OPERATIVE DIAGNOSIS:  RIGHT RENAL STONE  PROCEDURE:  Procedure(s): CYSTOSCOPY WITH RIGHT RETROGRADE, URETEROSCOPY, BASKETING OF STONE  AND STENT EXCHANGE (Right)  SURGEON:  Surgeons and Role:    * Dakhari Zuver, Delbert Phenix., MD - Primary  PHYSICIAN ASSISTANT:   ASSISTANTS: none   ANESTHESIA:   general  EBL:  minmal   BLOOD ADMINISTERED:none  DRAINS: none   LOCAL MEDICATIONS USED:  NONE  SPECIMEN:  Source of Specimen:  residual Rt renal stone fragments and clot  DISPOSITION OF SPECIMEN:   discard  COUNTS:  YES  TOURNIQUET:  * No tourniquets in log *  DICTATION: .Other Dictation: Dictation Number [id number]  PLAN OF CARE: Discharge to home after PACU  PATIENT DISPOSITION:  PACU - hemodynamically stable.   Delay start of Pharmacological VTE agent (>24hrs) due to surgical blood loss or risk of bleeding: yes

## 2022-10-12 NOTE — Anesthesia Procedure Notes (Signed)
Procedure Name: LMA Insertion Date/Time: 10/12/2022 1:30 PM  Performed by: [place], Clydene Pugh, CRNAPre-anesthesia Checklist: Patient identified, Emergency Drugs available, Suction available and Patient being monitored Patient Re-evaluated:Patient Re-evaluated prior to induction Oxygen Delivery Method: Circle system utilized Preoxygenation: Pre-oxygenation with 100% oxygen Induction Type: IV induction Ventilation: Mask ventilation without difficulty LMA: LMA inserted LMA Size: 4.0 Number of attempts: 1 Airway Equipment and Method: Bite block Placement Confirmation: positive ETCO2 Tube secured with: Tape Dental Injury: Teeth and Oropharynx as per pre-operative assessment

## 2022-10-12 NOTE — Anesthesia Postprocedure Evaluation (Signed)
Anesthesia Post Note  Patient: RILDA NORWAY  Procedure(s) Performed: CYSTOSCOPY WITH RIGHT RETROGRADE, URETEROSCOPY, BASKETING OF STONE  AND STENT EXCHANGE (Right)     Patient location during evaluation: PACU Anesthesia Type: General Level of consciousness: awake and alert Pain management: pain level controlled Vital Signs Assessment: post-procedure vital signs reviewed and stable Respiratory status: spontaneous breathing, nonlabored ventilation, respiratory function stable and patient connected to nasal cannula oxygen Cardiovascular status: blood pressure returned to baseline and stable Postop Assessment: no apparent nausea or vomiting Anesthetic complications: no   No notable events documented.  Last Vitals:  Vitals:   10/12/22 1600 10/12/22 1610  BP: (!) 160/77 (!) 163/77  Pulse: 67 77  Resp: (!) 22 20  Temp: 36.6 C 36.6 C  SpO2: 100% 100%    Last Pain:  Vitals:   10/12/22 1610  TempSrc: Oral  PainSc: 0-No pain                 Trevor Iha

## 2022-10-12 NOTE — H&P (Signed)
[name] is an 62 y.o. female.    Chief Complaint: Pre-Op RIGHT 2nd Stage Ureteroscopic Stone Manipulation  HPI:   1 - Recurrent Urolithiasis  Pre 2024 SWL x several (all pre liver transplant)   Recent Surveillance:  05/2022 - KUB - Rt upper 8mm, Rt lower 6mm stones (stable x years)  09/2022 - CT - Rt renal sotnes, large renal clot. JJ stent placed. ==> 8/21 first stage ureteroscoy  2 - Occasional Cystitis -  h/o klebsiella sens gent, levoquin, RES others. She is immune supressed, KUB w/o overt obstructing stone. Most recent UCX 8/23 negative.   PMH sig for liver failure/ transplant (follows [facility]), gastric bypass, CAD/Stent/Plavix (follows [facility] Dr. Diona Browner) her PCP is Dr. Sherwood Gambler.   Today " [name]" is seen to proceed with 2nd stage Rt ureteroscopy for goal of Rt side sotne free and max r/o other etiology to renal bleeding. Most recent UCX negative, Cr 2's.   Past Medical History:  Diagnosis Date   Anemia    Anxiety    Ascites    Cancer (HCC)    numerous skin cancer   CKD (chronic kidney disease) stage 4, GFR 15-29 ml/min (HCC)    Coronary artery disease    Depression    Enteropathy 07/31/2015   Portal hypertensive enteropathy per capsule study, with stigmata of bleeding.   Esophageal varices (HCC)    Fibromyalgia    Gastric ulcer 07/15/2015   EGD; no stigmata of bleeding   GI bleed 07/17/2015   Gram-negative bacteremia 12/08/2011   Headache(784.0)    Heart murmur    History of cirrhosis    History of hepatitis C - successfuly treated medically 07/14/2015   History of kidney stones    Myocardial infarction [facility])    Nephrolithiasis    Portal vein thrombosis    Retroperitoneal hemorrhage complicating cardiac catheterization, small 11/06/2020   Right ureteral stone 12/07/2011   S/P angioplasty CTO with stent  (DES) 11/04/20 of mid LAD 11/06/2020   Sleep apnea    Status post liver transplant (HCC) 08/2016   [facility]   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   [facility]   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   [facility]   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   Type 2 diabetes mellitus (HCC)    former, prior to transplant 2018    Past Surgical History:  Procedure Laterality Date   ABDOMINAL HYSTERECTOMY  2003   CESAREAN SECTION  1995   CHOLECYSTECTOMY  1987   COLONOSCOPY     COLONOSCOPY  10/21/2011   Procedure: COLONOSCOPY;  Surgeon: Malissa Hippo, MD;  Location: [facility];  Service: Endoscopy;  Laterality: N/A;  245    CORONARY CTO INTERVENTION N/A 11/04/2020   Procedure: CORONARY CTO INTERVENTION;  Surgeon: Swazil[place], Peter M, MD;  Location: [facility] INVASIVE CV LAB;  Service: Cardiovascular;  Laterality: N/A;   CYSTOSCOPY W/ RETROGRADES  12/08/2011   Procedure: CYSTOSCOPY WITH RETROGRADE PYELOGRAM;  Surgeon: Ky Barban, MD;  Location: [facility];  Service: Urology;  Laterality: Right;   CYSTOSCOPY WITH RETROGRADE PYELOGRAM, URETEROSCOPY AND STENT PLACEMENT Right 09/16/2022   Procedure: CYSTOSCOPY WITH RETROGRADE PYELOGRAM AND RIGHT STENT PLACEMENT;  Surgeon: Crist Fat, MD;  Location: [facility];  Service: Urology;  Laterality: Right;   CYSTOSCOPY WITH STENT PLACEMENT  12/08/2011   Procedure: CYSTOSCOPY WITH STENT PLACEMENT;  Surgeon: Ky Barban, MD;  Location: [facility];  Service: Urology;  Laterality: Right;  CYSTOSCOPY/URETEROSCOPY/HOLMIUM LASER/STENT PLACEMENT Bilateral 09/28/2022   Procedure: FIRST STAGGE CYSTOSCOPY BILATERAL RETROGRADES RIGHT URETEROSCOPY/HOLMIUM LASER/STENT EXCHANGE;  Surgeon: Loletta Parish., MD;  Location: [facility];  Service: Urology;  Laterality: Bilateral;  75 MINS FOR CASE   DILATION AND CURETTAGE OF UTERUS     ESOPHAGOGASTRODUODENOSCOPY N/A 02/19/2014   Procedure: ESOPHAGOGASTRODUODENOSCOPY (EGD);  Surgeon: Malissa Hippo, MD;  [facility]: [facility];  Service: Endoscopy;  Laterality: N/A;  100   ESOPHAGOGASTRODUODENOSCOPY N/A 07/15/2015   Procedure:  ESOPHAGOGASTRODUODENOSCOPY (EGD);  Surgeon: Malissa Hippo, MD;  [facility];  Service: Endoscopy;  Laterality: N/A;   ESOPHAGOGASTRODUODENOSCOPY N/A 07/29/2015   Procedure: ESOPHAGOGASTRODUODENOSCOPY (EGD);  Surgeon: Malissa Hippo, MD;  [facility];  Service: Endoscopy;  Laterality: N/A;   GASTRIC BYPASS  ~ 1995   HERNIA REPAIR  04/14/11   "one in my bellybutton" (11/07/2012)   INGUINAL HERNIA REPAIR Right    "maybe 2" (11/07/2012)   KNEE ARTHROSCOPY Right    LEFT HEART CATH AND CORONARY ANGIOGRAPHY N/A 09/25/2020   Procedure: LEFT HEART CATH AND CORONARY ANGIOGRAPHY;  Surgeon: Tonny Bollman, MD;  Location: [facility] INVASIVE CV LAB;  Service: Cardiovascular;  Laterality: N/A;   LITHOTRIPSY     "several times" (11/07/2012)   OPEN REDUCTION INTERNAL FIXATION (ORIF) DISTAL RADIAL FRACTURE Right 11/07/2012   Procedure: OPEN REDUCTION INTERNAL FIXATION (ORIF) RIGHT DISTAL RADIAL FRACTURE;  Surgeon: Sharma Covert, MD;  Location: [facility];  Service: Orthopedics;  Laterality: Right;   ORIF DISTAL RADIUS FRACTURE Right 11/07/2012   OTHER SURGICAL HISTORY  08/14/2016   5 organ transplant- liver, stomach, pancreas, small and large intestine   UPPER GASTROINTESTINAL ENDOSCOPY      Family History  Problem Relation Age of Onset   Dementia Mother    Heart disease Father    Liver disease Father    Hypertension Father    Diabetes Father    Dementia Father    Hypertension Brother    Social History:  reports that she quit smoking about 31 years ago. Her smoking use included cigarettes. She started smoking about 36 years ago. She has a 2.5 pack-year smoking history. She has never used smokeless tobacco. She reports that she does not drink alcohol and does not use drugs.  Allergies:  Allergies  Allergen Reactions   Ancef [Cefazolin Sodium] Other (See Comments)    Reaction:  Vaginal and mouth blisters   Nucynta [Tapentadol]     Pt unsure of allergy   Cefazolin Rash    Blisters in  mouth and vagina    Gadobenate Itching, Nausea And Vomiting and Rash   Iodinated Contrast Media Itching, Nausea And Vomiting and Rash   Tape Dermatitis and Rash    Adhesive on Surgical Tape, blisters skin     No medications prior to admission.    No results found for this or any previous visit (from the past 48 hour(s)). No results found.  Review of Systems  Constitutional:  Negative for chills and fever.  Genitourinary:  Positive for hematuria and urgency.  All other systems reviewed and are negative.   There were no vitals taken for this visit. Physical Exam Vitals reviewed.  HENT:     Head: Normocephalic.     Nose: Nose normal.  Eyes:     Pupils: Pupils are equal, round, and reactive to light.  Cardiovascular:     Rate and Rhythm: Normal rate.  Abdominal:     Comments: Stable truncal obesity  Genitourinary:  Comments: No CVAT at present Musculoskeletal:        General: Normal range of motion.  Skin:    General: Skin is warm.  Neurological:     General: No focal deficit present.     Mental Status: She is alert.  Psychiatric:        Mood and Affect: Mood normal.      Assessment/Plan  Proceed as planned with 2nd stage RIGHT ureteroscopic stone maniulation. Risks, benefits, alternatives, expected peri-o course discussed previously and reiterated today.   Loletta Parish., MD 10/12/2022, 5:42 AM

## 2022-10-13 ENCOUNTER — Encounter (HOSPITAL_COMMUNITY): Payer: Self-pay | Admitting: Urology

## 2022-10-24 DIAGNOSIS — N3020 Other chronic cystitis without hematuria: Secondary | ICD-10-CM | POA: Diagnosis not present

## 2022-10-24 DIAGNOSIS — N202 Calculus of kidney with calculus of ureter: Secondary | ICD-10-CM | POA: Diagnosis not present

## 2022-10-26 ENCOUNTER — Other Ambulatory Visit: Payer: Self-pay | Admitting: Cardiology

## 2022-12-01 DIAGNOSIS — J069 Acute upper respiratory infection, unspecified: Secondary | ICD-10-CM | POA: Diagnosis not present

## 2022-12-01 DIAGNOSIS — Z20828 Contact with and (suspected) exposure to other viral communicable diseases: Secondary | ICD-10-CM | POA: Diagnosis not present

## 2022-12-01 DIAGNOSIS — Z6830 Body mass index (BMI) 30.0-30.9, adult: Secondary | ICD-10-CM | POA: Diagnosis not present

## 2022-12-01 DIAGNOSIS — R6889 Other general symptoms and signs: Secondary | ICD-10-CM | POA: Diagnosis not present

## 2022-12-01 DIAGNOSIS — E6609 Other obesity due to excess calories: Secondary | ICD-10-CM | POA: Diagnosis not present

## 2022-12-01 DIAGNOSIS — Z944 Liver transplant status: Secondary | ICD-10-CM | POA: Diagnosis not present

## 2022-12-09 ENCOUNTER — Encounter (HOSPITAL_COMMUNITY): Payer: Self-pay | Admitting: *Deleted

## 2022-12-09 ENCOUNTER — Emergency Department (HOSPITAL_COMMUNITY)
Admission: EM | Admit: 2022-12-09 | Discharge: 2022-12-09 | Disposition: A | Discharge status: Home / Self Care | Payer: Medicare Other | Attending: Emergency Medicine | Admitting: Emergency Medicine

## 2022-12-09 ENCOUNTER — Other Ambulatory Visit: Payer: Self-pay

## 2022-12-09 ENCOUNTER — Emergency Department (HOSPITAL_COMMUNITY): Payer: Medicare Other

## 2022-12-09 DIAGNOSIS — I517 Cardiomegaly: Secondary | ICD-10-CM | POA: Diagnosis not present

## 2022-12-09 DIAGNOSIS — I25119 Atherosclerotic heart disease of native coronary artery with unspecified angina pectoris: Secondary | ICD-10-CM | POA: Diagnosis not present

## 2022-12-09 DIAGNOSIS — N184 Chronic kidney disease, stage 4 (severe): Secondary | ICD-10-CM | POA: Insufficient documentation

## 2022-12-09 DIAGNOSIS — Z79899 Other long term (current) drug therapy: Secondary | ICD-10-CM | POA: Insufficient documentation

## 2022-12-09 DIAGNOSIS — E1165 Type 2 diabetes mellitus with hyperglycemia: Secondary | ICD-10-CM | POA: Diagnosis not present

## 2022-12-09 DIAGNOSIS — Z87891 Personal history of nicotine dependence: Secondary | ICD-10-CM | POA: Insufficient documentation

## 2022-12-09 DIAGNOSIS — I2510 Atherosclerotic heart disease of native coronary artery without angina pectoris: Secondary | ICD-10-CM | POA: Diagnosis not present

## 2022-12-09 DIAGNOSIS — R519 Headache, unspecified: Secondary | ICD-10-CM | POA: Insufficient documentation

## 2022-12-09 DIAGNOSIS — Z7902 Long term (current) use of antithrombotics/antiplatelets: Secondary | ICD-10-CM | POA: Diagnosis not present

## 2022-12-09 DIAGNOSIS — E1122 Type 2 diabetes mellitus with diabetic chronic kidney disease: Secondary | ICD-10-CM | POA: Insufficient documentation

## 2022-12-09 DIAGNOSIS — Z85828 Personal history of other malignant neoplasm of skin: Secondary | ICD-10-CM | POA: Diagnosis not present

## 2022-12-09 DIAGNOSIS — I8500 Esophageal varices without bleeding: Secondary | ICD-10-CM | POA: Diagnosis not present

## 2022-12-09 DIAGNOSIS — Z7982 Long term (current) use of aspirin: Secondary | ICD-10-CM | POA: Insufficient documentation

## 2022-12-09 DIAGNOSIS — I10 Essential (primary) hypertension: Secondary | ICD-10-CM | POA: Diagnosis not present

## 2022-12-09 DIAGNOSIS — R0602 Shortness of breath: Secondary | ICD-10-CM | POA: Diagnosis not present

## 2022-12-09 DIAGNOSIS — E6609 Other obesity due to excess calories: Secondary | ICD-10-CM | POA: Diagnosis not present

## 2022-12-09 DIAGNOSIS — Z951 Presence of aortocoronary bypass graft: Secondary | ICD-10-CM | POA: Insufficient documentation

## 2022-12-09 DIAGNOSIS — R079 Chest pain, unspecified: Secondary | ICD-10-CM | POA: Diagnosis not present

## 2022-12-09 DIAGNOSIS — R002 Palpitations: Secondary | ICD-10-CM | POA: Diagnosis not present

## 2022-12-09 DIAGNOSIS — J069 Acute upper respiratory infection, unspecified: Secondary | ICD-10-CM | POA: Diagnosis not present

## 2022-12-09 DIAGNOSIS — Z6830 Body mass index (BMI) 30.0-30.9, adult: Secondary | ICD-10-CM | POA: Diagnosis not present

## 2022-12-09 LAB — COMPREHENSIVE METABOLIC PANEL
ALT: 40 U/L (ref 0–44)
AST: 39 U/L (ref 15–41)
Albumin: 3.3 g/dL — ABNORMAL LOW (ref 3.5–5.0)
Alkaline Phosphatase: 98 U/L (ref 38–126)
Anion gap: 11 (ref 5–15)
BUN: 23 mg/dL (ref 8–23)
CO2: 19 mmol/L — ABNORMAL LOW (ref 22–32)
Calcium: 8.6 mg/dL — ABNORMAL LOW (ref 8.9–10.3)
Chloride: 109 mmol/L (ref 98–111)
Creatinine, Ser: 2.72 mg/dL — ABNORMAL HIGH (ref 0.44–1.00)
GFR, Estimated: 19 mL/min — ABNORMAL LOW (ref >60)
Glucose, Bld: 103 mg/dL — ABNORMAL HIGH (ref 70–99)
Potassium: 3.7 mmol/L (ref 3.5–5.1)
Sodium: 139 mmol/L (ref 135–145)
Total Bilirubin: 0.2 mg/dL — ABNORMAL LOW (ref 0.3–1.2)
Total Protein: 6.9 g/dL (ref 6.5–8.1)

## 2022-12-09 LAB — TROPONIN I (HIGH SENSITIVITY)
Troponin I (High Sensitivity): 12 ng/L (ref <18)
Troponin I (High Sensitivity): 13 ng/L (ref <18)

## 2022-12-09 LAB — LIPASE, BLOOD: Lipase: 28 U/L (ref 11–51)

## 2022-12-09 LAB — CBC
HCT: 31.8 % — ABNORMAL LOW (ref 36.0–46.0)
Hemoglobin: 9.6 g/dL — ABNORMAL LOW (ref 12.0–15.0)
MCH: 28.9 pg (ref 26.0–34.0)
MCHC: 30.2 g/dL (ref 30.0–36.0)
MCV: 95.8 fL (ref 80.0–100.0)
Platelets: 355 10*3/uL (ref 150–400)
RBC: 3.32 MIL/uL — ABNORMAL LOW (ref 3.87–5.11)
RDW: 14.1 % (ref 11.5–15.5)
WBC: 5.2 10*3/uL (ref 4.0–10.5)
nRBC: 0 % (ref 0.0–0.2)

## 2022-12-09 LAB — D-DIMER, QUANTITATIVE: D-Dimer, Quant: 0.81 ug{FEU}/mL — ABNORMAL HIGH (ref 0.00–0.50)

## 2022-12-09 MED ORDER — TECHNETIUM TO 99M ALBUMIN AGGREGATED
4.0000 | Freq: Once | INTRAVENOUS | Status: AC | PRN
  Administered 2022-12-09: 4.4 via INTRAVENOUS

## 2022-12-09 NOTE — ED Notes (Signed)
Pt transported to nuclear test

## 2022-12-09 NOTE — ED Triage Notes (Signed)
Pt c/o sob since yesterday with chest pain;  Pt states she has been sick with cold sx and diarrhea x 10 days

## 2022-12-09 NOTE — ED Notes (Signed)
Pt requested to take transplant medication, EDP approved.

## 2022-12-09 NOTE — ED Provider Notes (Signed)
[place] [facility] DE[facility]MENT AT [facility] Provider Note  CSN: [id number] Arrival date & time: 12/09/22 0944  Chief Complaint(s) Shortness of Breath  HPI [name] is a 62 y.o. female history of CKD, prior liver transplant, coronary artery disease, pancreas transplant presenting to the emergency department with chest pain.  Patient reports developing chest pain and shortness of breath yesterday.  She reports the pain is worse with exertion and also a little pleuritic.  No syncope or dizziness.  No recent hospitalization, did have recent urologic procedures.  Reports also recent headache, reports this is new for her, was gradual onset.  She reports that over the last 10 days she has had a cough, sore throat, runny nose, diarrhea without abdominal pain, headache worse with the cough.  She reports some chills, has been following with her primary doctor but symptoms have been persistent.  She reports compliance with her medications.   Past Medical History Past Medical History:  Diagnosis Date   Anemia    Anxiety    Ascites    Cancer (HCC)    numerous skin cancer   CKD (chronic kidney disease) stage 4, GFR 15-29 ml/min (HCC)    Coronary artery disease    Depression    Enteropathy 07/31/2015   Portal hypertensive enteropathy per capsule study, with stigmata of bleeding.   Esophageal varices (HCC)    Fibromyalgia    Gastric ulcer 07/15/2015   EGD; no stigmata of bleeding   GI bleed 07/17/2015   Gram-negative bacteremia 12/08/2011   Headache(784.0)    Heart murmur    History of cirrhosis    History of hepatitis C - successfuly treated medically 07/14/2015   History of kidney stones    Myocardial infarction [facility])    Nephrolithiasis    Portal vein thrombosis    Retroperitoneal hemorrhage complicating cardiac catheterization, small 11/06/2020   Right ureteral stone 12/07/2011   S/P angioplasty CTO with stent  (DES) 11/04/20 of mid LAD 11/06/2020   Sleep apnea    Status  post liver transplant (HCC) 08/2016   [facility]   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   [facility]   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   [facility]   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   Type 2 diabetes mellitus (HCC)    former, prior to transplant 2018   Patient Active Problem List   Diagnosis Date Noted   Retroperitoneal hemorrhage complicating cardiac catheterization, small 11/06/2020   S/P angioplasty CTO with stent  (DES) 11/04/20 of mid LAD 11/06/2020   CAD (coronary artery disease) 11/04/2020   Coronary artery disease involving native coronary artery with angina pectoris (HCC) 09/25/2020   Diarrhea 03/11/2017   Nausea & vomiting 03/11/2017   Sepsis (HCC) 03/11/2017   Dehydration 03/11/2017   Immunosuppressed status (HCC)    Influenza-like illness 03/08/2017   Acute bronchitis, viral 03/08/2017   Anemia 03/06/2017   Headache 03/06/2017   Elevated blood pressure reading 03/06/2017   Liver transplant recipient [facility]) 03/06/2017   History of pancreas transplant (HCC) 03/06/2017   Acute viral syndrome 03/06/2017   Pancytopenia (HCC) 11/13/2015   Cirrhosis of liver with ascites (HCC)    Abdominal pain 11/10/2015   Alcoholic cirrhosis of liver with ascites (HCC)    Enteropathy 07/31/2015   Anemia, blood loss    Depression with anxiety 07/30/2015   Esophageal varices in cirrhosis (HCC) 07/30/2015   Gastritis determined by endoscopy 07/30/2015   Hypotension 07/30/2015   Hyponatremia 07/29/2015  Hypokalemia 07/29/2015   GI bleeding 07/29/2015   Gastric ulcer 07/29/2015   Leukopenia 07/29/2015   AKI (acute kidney injury) (HCC) 07/29/2015   Diabetes mellitus type 2, controlled, without complications (HCC) 07/29/2015   GI bleed 07/17/2015   Palliative care encounter    Goals of care, counseling/discussion    DNR (do not resuscitate) discussion    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   Melena 07/14/2015   Hepatic cirrhosis due  to chronic hepatitis C infection (HCC) 07/14/2015   History of hepatitis C - successfuly treated medically 07/14/2015   Portal vein thrombosis 05/13/2014   Other malaise and fatigue 08/05/2013   Hypoxemia 08/05/2013   Abdominal pain, right upper quadrant 05/29/2013   SBP (spontaneous bacterial peritonitis) (HCC) 05/28/2013   Distal radius fracture 10/25/2012   Metatarsal bone fracture 10/25/2012   Insomnia 09/25/2012   Hepatic encephalopathy (HCC) 06/07/2012   Hyperglycemia 06/06/2012   LUQ abdominal pain 06/05/2012   Ascites 05/14/2012   Gram-negative bacteremia 12/08/2011   Pyelonephritis 12/07/2011   Hydronephrosis of right kidney 12/07/2011   Right ureteral stone 12/07/2011   Thrombocytopenia (HCC) 12/07/2011   Cirrhosis of liver (HCC) 05/02/2011   Acute blood loss anemia 05/02/2011   Home Medication(s) Prior to Admission medications   Medication Sig Start Date End Date Taking? Authorizing Provider  aspirin EC 81 MG tablet Take 1 tablet (81 mg total) by mouth daily. Swallow whole. 05/14/20   Jonelle Sidle, MD  atenolol (TENORMIN) 25 MG tablet Take 0.5 tablets (12.5 mg total) by mouth daily. 08/19/22   Jonelle Sidle, MD  buPROPion ER [facility] SR) 100 MG 12 hr tablet Take 100 mg by mouth daily. 06/14/21   [provider]  clopidogrel (PLAVIX) 75 MG tablet Take 1 tablet (75 mg total) by mouth daily. 10/26/22   Jonelle Sidle, MD  cycloSPORINE (SANDIMMUNE) 100 MG capsule Take 100 mg by mouth daily.    [provider]  cycloSPORINE (SANDIMMUNE) 25 MG capsule Take 75 mg by mouth at bedtime.    [provider]  ezetimibe (ZETIA) 10 MG tablet take 1 tablet once daily. 06/13/22   Jonelle Sidle, MD  Multiple Vitamins-Minerals (SUPER THERA VITE M PO) Take 1 tablet by mouth daily.    [provider]  mycophenolate (CELLCEPT) 250 MG capsule Take 250 mg by mouth every morning.    [provider]  nitroGLYCERIN (NITROSTAT) 0.4 MG SL  tablet Place 1 tablet (0.4 mg total) under the tongue every 5 (five) minutes as needed. Patient taking differently: Place 0.4 mg under the tongue every 5 (five) minutes as needed for chest pain. 06/11/20   Jonelle Sidle, MD  Oxycodone HCl 10 MG TABS Take 1 tablet (10 mg total) by mouth every 6 (six) hours as needed (post-op pain). 10/12/22   Loletta Parish., MD  pantoprazole (PROTONIX) 40 MG tablet Take 40 mg by mouth at bedtime. 09/17/20   [provider]  phentermine (ADIPEX-P) 37.5 MG tablet Take 37.5 mg by mouth daily. 06/04/21   [provider]  pravastatin (PRAVACHOL) 20 MG tablet Take 1 tablet (20 mg total) by mouth daily. 07/20/22   Jonelle Sidle, MD  tamsulosin (FLOMAX) 0.4 MG CAPS capsule Take 1 capsule (0.4 mg total) by mouth daily. 09/16/22   Matthew-Onabanjo, [place], MD  traZODone (DESYREL) 100 MG tablet Take 100 mg by mouth at bedtime.    [provider]  TRINTELLIX 10 MG TABS tablet Take 10 mg by mouth daily.  06/14/21   [provider]                                                                                                                                    Past Surgical History Past Surgical History:  Procedure Laterality Date   ABDOMINAL HYSTERECTOMY  2003   CESAREAN SECTION  1995   CHOLECYSTECTOMY  1987   COLONOSCOPY     COLONOSCOPY  10/21/2011   Procedure: COLONOSCOPY;  Surgeon: Malissa Hippo, MD;  Location: [facility]O [facility];  Service: Endoscopy;  Laterality: N/A;  245    CORONARY CTO INTERVENTION N/A 11/04/2020   Procedure: CORONARY CTO INTERVENTION;  Surgeon: Swazil[place], Peter M, MD;  Location: [facility] INVASIVE CV LAB;  Service: Cardiovascular;  Laterality: N/A;   CYSTOSCOPY W/ RETROGRADES  12/08/2011   Procedure: CYSTOSCOPY WITH RETROGRADE PYELOGRAM;  Surgeon: Ky Barban, MD;  Location: [facility] ORS;  Service: Urology;  Laterality: Right;   CYSTOSCOPY WITH RETROGRADE PYELOGRAM, URETEROSCOPY AND STENT PLACEMENT Right 09/16/2022    Procedure: CYSTOSCOPY WITH RETROGRADE PYELOGRAM AND RIGHT STENT PLACEMENT;  Surgeon: Crist Fat, MD;  Location: [facility] ORS;  Service: Urology;  Laterality: Right;   CYSTOSCOPY WITH RETROGRADE PYELOGRAM, URETEROSCOPY AND STENT PLACEMENT Right 10/12/2022   Procedure: CYSTOSCOPY WITH RIGHT RETROGRADE, URETEROSCOPY, BASKETING OF STONE  AND STENT EXCHANGE;  Surgeon: Loletta Parish., MD;  Location: [facility] ORS;  Service: Urology;  Laterality: Right;   CYSTOSCOPY WITH STENT PLACEMENT  12/08/2011   Procedure: CYSTOSCOPY WITH STENT PLACEMENT;  Surgeon: Ky Barban, MD;  Location: [facility] ORS;  Service: Urology;  Laterality: Right;   CYSTOSCOPY/URETEROSCOPY/HOLMIUM LASER/STENT PLACEMENT Bilateral 09/28/2022   Procedure: FIRST STAGGE CYSTOSCOPY BILATERAL RETROGRADES RIGHT URETEROSCOPY/HOLMIUM LASER/STENT EXCHANGE;  Surgeon: Loletta Parish., MD;  Location: [facility] ORS;  Service: Urology;  Laterality: Bilateral;  75 MINS FOR CASE   DILATION AND CURETTAGE OF UTERUS     ESOPHAGOGASTRODUODENOSCOPY N/A 02/19/2014   Procedure: ESOPHAGOGASTRODUODENOSCOPY (EGD);  Surgeon: Malissa Hippo, MD;  Location: [facility];  Service: Endoscopy;  Laterality: N/A;  100   ESOPHAGOGASTRODUODENOSCOPY N/A 07/15/2015   Procedure: ESOPHAGOGASTRODUODENOSCOPY (EGD);  Surgeon: Malissa Hippo, MD;  [facility];  Service: Endoscopy;  Laterality: N/A;   ESOPHAGOGASTRODUODENOSCOPY N/A 07/29/2015   Procedure: ESOPHAGOGASTRODUODENOSCOPY (EGD);  Surgeon: Malissa Hippo, MD;  Location: [facility];  Service: Endoscopy;  Laterality: N/A;   GASTRIC BYPASS  ~ 1995   HERNIA REPAIR  04/14/11   "one in my bellybutton" (11/07/2012)   INGUINAL HERNIA REPAIR Right    "maybe 2" (11/07/2012)   KNEE ARTHROSCOPY Right    LEFT HEART CATH AND CORONARY ANGIOGRAPHY N/A 09/25/2020   Procedure: LEFT HEART CATH AND CORONARY ANGIOGRAPHY;  Surgeon: Tonny Bollman, MD;  Location: [facility] INVASIVE CV LAB;  Service: Cardiovascular;  Laterality: N/A;    LITHOTRIPSY     "several times" (11/07/2012)   OPEN REDUCTION INTERNAL FIXATION (ORIF) DISTAL  RADIAL FRACTURE Right 11/07/2012   Procedure: OPEN REDUCTION INTERNAL FIXATION (ORIF) RIGHT DISTAL RADIAL FRACTURE;  Surgeon: Sharma Covert, MD;  Location: [facility] OR;  Service: Orthopedics;  Laterality: Right;   ORIF DISTAL RADIUS FRACTURE Right 11/07/2012   OTHER SURGICAL HISTORY  08/14/2016   5 organ transplant- liver, stomach, pancreas, small and large intestine   UPPER GASTROINTESTINAL ENDOSCOPY     Family History Family History  Problem Relation Age of Onset   Dementia Mother    Heart disease Father    Liver disease Father    Hypertension Father    Diabetes Father    Dementia Father    Hypertension Brother     Social History Social History   Tobacco Use   Smoking status: Former    Current packs/day: 0.00    Average packs/day: 0.5 packs/day for 5.0 years (2.5 ttl pk-yrs)    Types: Cigarettes    Start date: 05/02/1986    Quit date: 05/02/1991    Years since quitting: 31.6   Smokeless tobacco: Never  Vaping Use   Vaping status: Never Used  Substance Use Topics   Alcohol use: No    Alcohol/week: 0.0 standard drinks of alcohol   Drug use: No   Allergies Ancef [cefazolin sodium], Nucynta [tapentadol], Cefazolin, Gadobenate, Iodinated contrast media, and Tape  Review of Systems Review of Systems  All other systems reviewed and are negative.   Physical Exam Vital Signs  I have reviewed the triage vital signs BP (!) 185/81   Pulse 62   Temp (!) 97 F (36.1 C) (Oral)   Resp (!) 21   Ht 5\' 5"  (1.651 m)   Wt 85.3 kg   SpO2 99%   BMI 31.28 kg/m  Physical Exam Vitals and nursing note reviewed.  Constitutional:      General: She is not in acute distress.    Appearance: She is well-developed.  HENT:     Head: Normocephalic and atraumatic.     Mouth/Throat:     Mouth: Mucous membranes are moist.  Eyes:     Pupils: Pupils are equal, round, and reactive to light.   Cardiovascular:     Rate and Rhythm: Normal rate and regular rhythm.     Heart sounds: No murmur heard. Pulmonary:     Effort: Pulmonary effort is normal. No respiratory distress.     Breath sounds: Normal breath sounds.  Abdominal:     General: Abdomen is flat.     Palpations: Abdomen is soft.     Tenderness: There is no abdominal tenderness.  Musculoskeletal:        General: No tenderness.     Right lower leg: No edema.     Left lower leg: No edema.  Skin:    General: Skin is warm and dry.  Neurological:     General: No focal deficit present.     Mental Status: She is alert. Mental status is at baseline.  Psychiatric:        Mood and Affect: Mood normal.        Behavior: Behavior normal.     ED Results and Treatments Labs (all labs ordered are listed, but only abnormal results are displayed) Labs Reviewed  CBC - Abnormal; Notable for the following components:      Result Value   RBC 3.32 (*)    Hemoglobin 9.6 (*)    HCT 31.8 (*)    All other components within normal limits  COMPREHENSIVE METABOLIC PANEL - Abnormal; Notable  for the following components:   CO2 19 (*)    Glucose, Bld 103 (*)    Creatinine, Ser 2.72 (*)    Calcium 8.6 (*)    Albumin 3.3 (*)    Total Bilirubin 0.2 (*)    GFR, Estimated 19 (*)    All other components within normal limits  D-DIMER, QUANTITATIVE - Abnormal; Notable for the following components:   D-Dimer, Quant 0.81 (*)    All other components within normal limits  LIPASE, BLOOD  TROPONIN I (HIGH SENSITIVITY)  TROPONIN I (HIGH SENSITIVITY)                                                                                                                          Radiology NM Pulmonary Perfusion  Result Date: 12/09/2022 CLINICAL DATA:  Ten day history of shortness of breath and chest pain EXAM: NUCLEAR MEDICINE PERFUSION LUNG SCAN TECHNIQUE: Perfusion images were obtained in multiple projections after intravenous injection of  radiopharmaceutical. Ventilation scans intentionally deferred if perfusion scan and chest x-ray adequate for interpretation during COVID 19 epidemic. RADIOPHARMACEUTICALS:  4.4 mCi Tc-30m MAA IV COMPARISON:  None Available. FINDINGS: No segmental perfusion defects. IMPRESSION: Pulmonary embolism absent (very low probability) Electronically Signed   By: Allegra Lai M.D.   On: 12/09/2022 15:43   CT Head Wo Contrast  Result Date: 12/09/2022 CLINICAL DATA:  Headache, new onset. EXAM: CT HEAD WITHOUT CONTRAST TECHNIQUE: Contiguous axial images were obtained from the base of the skull through the vertex without intravenous contrast. RADIATION DOSE REDUCTION: This exam was performed according to the departmental dose-optimization program which includes automated exposure control, adjustment of the mA and/or kV according to patient size and/or use of iterative reconstruction technique. COMPARISON:  05/12/2021 FINDINGS: Brain: No evidence of acute infarction, hemorrhage, hydrocephalus, extra-axial collection or mass lesion/mass effect. Vascular: No hyperdense vessel or unexpected calcification. Skull: Normal. Negative for fracture or focal lesion. Sinuses/Orbits: No acute finding. IMPRESSION: Normal head CT. Electronically Signed   By: Tiburcio Pea M.D.   On: 12/09/2022 12:18   DG Chest Portable 1 View  Result Date: 12/09/2022 CLINICAL DATA:  Shortness of breath.  Chest pain. EXAM: PORTABLE CHEST 1 VIEW COMPARISON:  Chest radiograph dated April 16, 2017. FINDINGS: The heart size is borderline enlarged. Mediastinal contours are within normal limits. Both lungs are clear. No pneumothorax or sizable pleural effusion. No acute osseous abnormality. IMPRESSION: Borderline enlarged heart size.  No acute cardiopulmonary findings. Electronically Signed   By: Hart Robinsons M.D.   On: 12/09/2022 12:02    Pertinent labs & imaging results that were available during my care of the patient were reviewed by me and  considered in my medical decision making (see MDM for details).  Medications Ordered in ED Medications  technetium albumin aggregated (MAA) injection solution 4 millicurie (4.4 millicuries Intravenous Contrast Given 12/09/22 1318)  Procedures Procedures  (including critical care time)  Medical Decision Making / ED Course   MDM:  62 year old presenting to the emergency department with chest pain.  Patient overall well-appearing, vital signs reassuring, no hypoxia, no tachycardia.    Differential includes ACS although initial troponin negative so lower concern for this, EKG not suggestive of this, and her symptoms are atypical.  Her initial EKG did show possible atrial fibrillation versus artifact although on monitor she is now in sinus rhythm.  Also considered pulmonary embolism, she reports the pain is a little pleuritic, she has some shortness of breath, no clinical signs or symptoms of DVT but she did have a recent urologic surgery.  D-dimer was obtained and is positive, however patient has a history of CKD and her GFR is 19 so she will need nuclear medicine study.  Chest x-ray clear without evidence of acute process.  No evidence of pneumothorax, pneumonia.  Chest pain could also be musculoskeletal in the setting of recent URI symptoms and cough.  Patient also reporting 10 days of diarrhea.  She also has some URI symptoms.  Symptoms seem most consistent with upper respiratory tract infection.  Her labs are reassuring and at baseline.  No rectal bleeding.  No abdominal tenderness to suggest any acute intra-abdominal process.     Clinical Course as of 12/09/22 1614  Fri Dec 09, 2022  1612 Nuclear medicine study performed and negative.  CT head also performed given the patient's headache and negative for any acute intracranial process.  Workup is overall  reassuring.  Advise close follow-up with her cardiologist, primary care physician, and her liver transplant team. Will discharge patient to home. All questions answered. Patient comfortable with plan of discharge. Return precautions discussed with patient and specified on the after visit summary. [WS]    Clinical Course User Index [WS] Lonell Grandchild, MD     Additional history obtained: -Additional history obtained from spouse -External records from outside source obtained and reviewed including: Chart review including previous notes, labs, imaging, consultation notes including prior notes    Lab Tests: -I ordered, reviewed, and interpreted labs.   The pertinent results include:   Labs Reviewed  CBC - Abnormal; Notable for the following components:      Result Value   RBC 3.32 (*)    Hemoglobin 9.6 (*)    HCT 31.8 (*)    All other components within normal limits  COMPREHENSIVE METABOLIC PANEL - Abnormal; Notable for the following components:   CO2 19 (*)    Glucose, Bld 103 (*)    Creatinine, Ser 2.72 (*)    Calcium 8.6 (*)    Albumin 3.3 (*)    Total Bilirubin 0.2 (*)    GFR, Estimated 19 (*)    All other components within normal limits  D-DIMER, QUANTITATIVE - Abnormal; Notable for the following components:   D-Dimer, Quant 0.81 (*)    All other components within normal limits  LIPASE, BLOOD  TROPONIN I (HIGH SENSITIVITY)  TROPONIN I (HIGH SENSITIVITY)    Notable for CKD. No leukocytosis, normal troponin. Positive d-dimer  EKG   EKG Interpretation Date/Time:  Friday December 09 2022 12:25:05 EDT Ventricular Rate:  63 PR Interval:  178 QRS Duration:  97 QT Interval:  429 QTC Calculation: 440 R Axis:   38  Text Interpretation: Sinus arrhythmia Abnormal R-wave progression, early transition Confirmed by Alvino Blood ([id number]) on 12/09/2022 12:31:10 PM         Imaging Studies ordered: I  ordered imaging studies including CT head, CXR, Vq scan  On my  interpretation imaging demonstrates no acute intracranial process, pneumonia, or PE  I independently visualized and interpreted imaging. I agree with the radiologist interpretation   Medicines ordered and prescription drug management: Meds ordered this encounter  Medications   technetium albumin aggregated (MAA) injection solution 4 millicurie    -I have reviewed the patients home medicines and have made adjustments as needed   Social Determinants of Health:  Diagnosis or treatment significantly limited by social determinants of health: obesity   Reevaluation: After the interventions noted above, I reevaluated the patient and found that their symptoms have improved  Co morbidities that complicate the patient evaluation  Past Medical History:  Diagnosis Date   Anemia    Anxiety    Ascites    Cancer (HCC)    numerous skin cancer   CKD (chronic kidney disease) stage 4, GFR 15-29 ml/min (HCC)    Coronary artery disease    Depression    Enteropathy 07/31/2015   Portal hypertensive enteropathy per capsule study, with stigmata of bleeding.   Esophageal varices (HCC)    Fibromyalgia    Gastric ulcer 07/15/2015   EGD; no stigmata of bleeding   GI bleed 07/17/2015   Gram-negative bacteremia 12/08/2011   Headache(784.0)    Heart murmur    History of cirrhosis    History of hepatitis C - successfuly treated medically 07/14/2015   History of kidney stones    Myocardial infarction [facility])    Nephrolithiasis    Portal vein thrombosis    Retroperitoneal hemorrhage complicating cardiac catheterization, small 11/06/2020   Right ureteral stone 12/07/2011   S/P angioplasty CTO with stent  (DES) 11/04/20 of mid LAD 11/06/2020   Sleep apnea    Status post liver transplant (HCC) 08/2016   [facility]   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    Final Clinical Impression(s) / ED Diagnoses Final diagnoses:  Shortness of breath     This chart was dictated using voice recognition software.  Despite best efforts to proofread,  errors can occur which can change the documentation meaning.    Lonell Grandchild, MD 12/09/22 [phone]

## 2022-12-09 NOTE — Discharge Instructions (Signed)
We evaluated you for your shortness of breath and your chest pain.  Your testing was reassuring.  Your chest x-ray was clear.  We also obtained heart testing including cardiac enzymes which did not show any signs of a heart attack.  We obtained a nuclear medicine study to evaluate for a blood clot which was also negative.  Please follow-up closely with your cardiologist.  We have placed a referral to help facilitate this.  Please also follow-up with your primary doctor.  If you have any new or worsening symptoms such as increasing shortness of breath, chest pain, cough, fevers, abdominal pain, bloody stools, headaches, or any other new symptoms please return to the emergency department.

## 2023-01-02 DIAGNOSIS — D849 Immunodeficiency, unspecified: Secondary | ICD-10-CM | POA: Diagnosis not present

## 2023-01-02 DIAGNOSIS — Z9482 Intestine transplant status: Secondary | ICD-10-CM | POA: Diagnosis not present

## 2023-01-20 ENCOUNTER — Encounter: Payer: Self-pay | Admitting: Internal Medicine

## 2023-01-20 DIAGNOSIS — Z1231 Encounter for screening mammogram for malignant neoplasm of breast: Secondary | ICD-10-CM

## 2023-01-25 ENCOUNTER — Encounter: Payer: Self-pay | Admitting: Cardiology

## 2023-01-26 ENCOUNTER — Other Ambulatory Visit: Payer: Self-pay | Admitting: Internal Medicine

## 2023-01-26 ENCOUNTER — Ambulatory Visit: Payer: Medicare Other | Admitting: Cardiology

## 2023-01-26 DIAGNOSIS — D0461 Carcinoma in situ of skin of right upper limb, including shoulder: Secondary | ICD-10-CM | POA: Diagnosis not present

## 2023-01-26 DIAGNOSIS — R928 Other abnormal and inconclusive findings on diagnostic imaging of breast: Secondary | ICD-10-CM

## 2023-01-26 DIAGNOSIS — N644 Mastodynia: Secondary | ICD-10-CM

## 2023-01-26 DIAGNOSIS — L570 Actinic keratosis: Secondary | ICD-10-CM | POA: Diagnosis not present

## 2023-01-26 DIAGNOSIS — L821 Other seborrheic keratosis: Secondary | ICD-10-CM | POA: Diagnosis not present

## 2023-01-26 DIAGNOSIS — L814 Other melanin hyperpigmentation: Secondary | ICD-10-CM | POA: Diagnosis not present

## 2023-01-27 ENCOUNTER — Other Ambulatory Visit: Payer: Self-pay | Admitting: Internal Medicine

## 2023-01-27 DIAGNOSIS — N644 Mastodynia: Secondary | ICD-10-CM

## 2023-02-03 ENCOUNTER — Other Ambulatory Visit: Payer: Self-pay | Admitting: Cardiology

## 2023-02-14 DIAGNOSIS — F413 Other mixed anxiety disorders: Secondary | ICD-10-CM | POA: Diagnosis not present

## 2023-02-14 DIAGNOSIS — U099 Post covid-19 condition, unspecified: Secondary | ICD-10-CM | POA: Diagnosis not present

## 2023-02-14 DIAGNOSIS — F3289 Other specified depressive episodes: Secondary | ICD-10-CM | POA: Diagnosis not present

## 2023-02-14 DIAGNOSIS — R5383 Other fatigue: Secondary | ICD-10-CM | POA: Diagnosis not present

## 2023-02-14 DIAGNOSIS — R6889 Other general symptoms and signs: Secondary | ICD-10-CM | POA: Diagnosis not present

## 2023-02-24 DIAGNOSIS — D849 Immunodeficiency, unspecified: Secondary | ICD-10-CM | POA: Diagnosis not present

## 2023-02-24 DIAGNOSIS — I25119 Atherosclerotic heart disease of native coronary artery with unspecified angina pectoris: Secondary | ICD-10-CM | POA: Diagnosis not present

## 2023-02-24 DIAGNOSIS — E782 Mixed hyperlipidemia: Secondary | ICD-10-CM | POA: Diagnosis not present

## 2023-02-24 DIAGNOSIS — Z9482 Intestine transplant status: Secondary | ICD-10-CM | POA: Diagnosis not present

## 2023-02-24 DIAGNOSIS — Z79899 Other long term (current) drug therapy: Secondary | ICD-10-CM | POA: Diagnosis not present

## 2023-02-25 LAB — LIPID PANEL
Chol/HDL Ratio: 2.9 {ratio} (ref 0.0–4.4)
Cholesterol, Total: 143 mg/dL (ref 100–199)
HDL: 49 mg/dL (ref >39)
LDL Chol Calc (NIH): 74 mg/dL (ref 0–99)
Triglycerides: 111 mg/dL (ref 0–149)
VLDL Cholesterol Cal: 20 mg/dL (ref 5–40)

## 2023-03-01 ENCOUNTER — Ambulatory Visit
Admission: RE | Admit: 2023-03-01 | Discharge: 2023-03-01 | Disposition: A | Discharge status: Home / Self Care | Payer: Medicare Other | Source: Ambulatory Visit | Attending: Internal Medicine | Admitting: Internal Medicine

## 2023-03-01 DIAGNOSIS — N644 Mastodynia: Secondary | ICD-10-CM

## 2023-03-01 DIAGNOSIS — R92313 Mammographic fatty tissue density, bilateral breasts: Secondary | ICD-10-CM | POA: Diagnosis not present

## 2023-03-21 ENCOUNTER — Ambulatory Visit: Payer: Medicare Other | Attending: Cardiology | Admitting: Cardiology

## 2023-03-21 ENCOUNTER — Encounter: Payer: Self-pay | Admitting: Cardiology

## 2023-03-21 VITALS — BP 138/84 | HR 53 | Ht 65.0 in | Wt 203.4 lb

## 2023-03-21 DIAGNOSIS — I25119 Atherosclerotic heart disease of native coronary artery with unspecified angina pectoris: Secondary | ICD-10-CM

## 2023-03-21 DIAGNOSIS — E782 Mixed hyperlipidemia: Secondary | ICD-10-CM

## 2023-03-21 DIAGNOSIS — Z79899 Other long term (current) drug therapy: Secondary | ICD-10-CM

## 2023-03-21 MED ORDER — PRAVASTATIN SODIUM 40 MG PO TABS: 40.0000 mg | ORAL_TABLET | Freq: Every evening | ORAL | 3 refills | Status: AC

## 2023-03-21 NOTE — Progress Notes (Signed)
    Cardiology Office Note  Date: 03/21/2023   ID: [name], DOB Jul 02, 1960, MRN [id number]  History of Present Illness: [name] is a 63 y.o. female last seen in June 2024.  She is here for a follow-up visit.  From a cardiac perspective, she does not report any angina on current medical regimen.  She continues to follow regularly at [facility] in the transplant clinic.  I reviewed her medications.  She continues on aspirin, Plavix, atenolol, Zetia, Pravachol, and as needed nitroglycerin.  We had increased her Pravachol to 20 mg daily at last visit, follow-up LDL essentially the same range at 74 in January, LFTs normal at that time.  Heart rate tends to run in the 50s to 60s, she is not clearly symptomatic with this.  I reviewed her ECG from November 2024 which showed sinus arrhythmia with lead artifact (not atrial fibrillation).  Physical Exam: VS:  BP 138/84 (BP Location: Left Arm, Patient Position: Sitting, Cuff Size: Large)   Pulse (!) 53   Ht 5\' 5"  (1.651 m)   Wt 203 lb 6.4 oz (92.3 kg)   SpO2 99%   BMI 33.85 kg/m , BMI Body mass index is 33.85 kg/m.  Wt Readings from Last 3 Encounters:  03/21/23 203 lb 6.4 oz (92.3 kg)  12/09/22 188 lb (85.3 kg)  10/12/22 197 lb 5 oz (89.5 kg)    General: Patient appears comfortable at rest. HEENT: Conjunctiva and lids normal. Neck: Supple, no elevated JVP or carotid bruits. Lungs: Clear to auscultation, nonlabored breathing at rest. Cardiac: Regular rate and rhythm, no S3 or significant systolic murmur, no pericardial rub. Extremities: No pitting edema.  ECG:  An ECG dated 12/09/2022 was personally reviewed today and demonstrated:  Sinus arrhythmia with lead artifact.  Labwork: 12/09/2022: ALT 40; AST 39; BUN 23; Creatinine, Ser 2.72; Hemoglobin 9.6; Platelets 355; Potassium 3.7; Sodium 139     Component Value Date/Time   CHOL 143 02/24/2023 1206   TRIG 111 02/24/2023 1206   HDL 49 02/24/2023 1206   CHOLHDL 2.9 02/24/2023 1206    LDLCALC 74 02/24/2023 1206  January 2025: Cholesterol 143, triglycerides 111, HDL 49, LDL 74, BUN 24, creatinine 2.3, GFR 24, potassium 4.9, AST 22, ALT 20, hemoglobin 9.1, platelets 353  Other Studies Reviewed Today:  No interval cardiac testing for review today.  Assessment and Plan:  1.  CAD status post CTO PCI with DES to the LAD in September 2022.  LVEF 60 to 65% by echocardiogram at that time.  She does not report any progressive angina on medical therapy, no interval nitroglycerin use.  Bradycardic on low-dose atenolol but not clearly symptomatic.  I reviewed her interval ECG.  Plan to continue aspirin, Plavix, Pravachol, and Zetia.   2.  Mixed hyperlipidemia, increase Pravachol to 40 mg daily and continue Zetia.  Recheck FLP and LFTs in 6 months.   3.  CKD stage IV, creatinine 2.3 with GFR 24 in January.   4.  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  She continues to follow regularly on immunosuppressive treatment.  Disposition:  Follow up  6 months.  Signed, Jonelle Sidle, M.D., F.A.C.C. [facility] at [facility]

## 2023-03-21 NOTE — Patient Instructions (Addendum)
Medication Instructions:  Your physician has recommended you make the following change in your medication:  Increase pravastatin to 40 mg daily Continue all other medications as prescribed  Labwork: Your physician recommends that you return for a FASTING lipid/liver profile in 6 months just before your next visit. Please do not eat or drink for at least 8 hours when you have this done. You may take your medications that morning with a sip of water. [facility]  Testing/Procedures: none  Follow-Up: Your physician recommends that you schedule a follow-up appointment in: 6 months  Any Other Special Instructions Will Be Listed Below (If Applicable).  If you need a refill on your cardiac medications before your next appointment, please call your pharmacy.

## 2023-03-30 DIAGNOSIS — Z9482 Intestine transplant status: Secondary | ICD-10-CM | POA: Diagnosis not present

## 2023-04-13 ENCOUNTER — Ambulatory Visit (HOSPITAL_COMMUNITY)
Admission: RE | Admit: 2023-04-13 | Discharge: 2023-04-13 | Disposition: A | Discharge status: Home / Self Care | Source: Ambulatory Visit | Attending: Internal Medicine | Admitting: Internal Medicine

## 2023-04-13 ENCOUNTER — Encounter (HOSPITAL_COMMUNITY): Payer: Self-pay | Admitting: Internal Medicine

## 2023-04-13 ENCOUNTER — Other Ambulatory Visit (HOSPITAL_COMMUNITY): Payer: Self-pay | Admitting: Internal Medicine

## 2023-04-13 DIAGNOSIS — E6609 Other obesity due to excess calories: Secondary | ICD-10-CM | POA: Diagnosis not present

## 2023-04-13 DIAGNOSIS — R1011 Right upper quadrant pain: Secondary | ICD-10-CM

## 2023-04-13 DIAGNOSIS — N184 Chronic kidney disease, stage 4 (severe): Secondary | ICD-10-CM | POA: Diagnosis not present

## 2023-04-13 DIAGNOSIS — I8500 Esophageal varices without bleeding: Secondary | ICD-10-CM | POA: Diagnosis not present

## 2023-04-13 DIAGNOSIS — N200 Calculus of kidney: Secondary | ICD-10-CM | POA: Diagnosis not present

## 2023-04-13 DIAGNOSIS — K5730 Diverticulosis of large intestine without perforation or abscess without bleeding: Secondary | ICD-10-CM | POA: Diagnosis not present

## 2023-04-13 DIAGNOSIS — I25119 Atherosclerotic heart disease of native coronary artery with unspecified angina pectoris: Secondary | ICD-10-CM | POA: Diagnosis not present

## 2023-04-13 DIAGNOSIS — Z6833 Body mass index (BMI) 33.0-33.9, adult: Secondary | ICD-10-CM | POA: Diagnosis not present

## 2023-04-13 DIAGNOSIS — Z944 Liver transplant status: Secondary | ICD-10-CM | POA: Diagnosis not present

## 2023-04-13 DIAGNOSIS — D849 Immunodeficiency, unspecified: Secondary | ICD-10-CM | POA: Diagnosis not present

## 2023-04-13 DIAGNOSIS — I10 Essential (primary) hypertension: Secondary | ICD-10-CM | POA: Diagnosis not present

## 2023-04-17 DIAGNOSIS — I129 Hypertensive chronic kidney disease with stage 1 through stage 4 chronic kidney disease, or unspecified chronic kidney disease: Secondary | ICD-10-CM | POA: Diagnosis not present

## 2023-04-17 DIAGNOSIS — E1122 Type 2 diabetes mellitus with diabetic chronic kidney disease: Secondary | ICD-10-CM | POA: Diagnosis not present

## 2023-04-17 DIAGNOSIS — R8290 Unspecified abnormal findings in urine: Secondary | ICD-10-CM | POA: Diagnosis not present

## 2023-04-17 DIAGNOSIS — N184 Chronic kidney disease, stage 4 (severe): Secondary | ICD-10-CM | POA: Diagnosis not present

## 2023-04-20 ENCOUNTER — Other Ambulatory Visit (HOSPITAL_COMMUNITY): Payer: Self-pay | Admitting: Nephrology

## 2023-04-20 DIAGNOSIS — N184 Chronic kidney disease, stage 4 (severe): Secondary | ICD-10-CM

## 2023-04-20 DIAGNOSIS — R809 Proteinuria, unspecified: Secondary | ICD-10-CM

## 2023-04-20 DIAGNOSIS — R8290 Unspecified abnormal findings in urine: Secondary | ICD-10-CM

## 2023-04-20 DIAGNOSIS — N070 Hereditary nephropathy, not elsewhere classified with minor glomerular abnormality: Secondary | ICD-10-CM

## 2023-04-26 ENCOUNTER — Ambulatory Visit (HOSPITAL_COMMUNITY)
Admission: RE | Admit: 2023-04-26 | Discharge: 2023-04-26 | Disposition: A | Discharge status: Home / Self Care | Source: Ambulatory Visit | Attending: Nephrology | Admitting: Nephrology

## 2023-04-26 DIAGNOSIS — Z9482 Intestine transplant status: Secondary | ICD-10-CM | POA: Diagnosis not present

## 2023-04-26 DIAGNOSIS — D849 Immunodeficiency, unspecified: Secondary | ICD-10-CM | POA: Diagnosis not present

## 2023-04-26 DIAGNOSIS — R809 Proteinuria, unspecified: Secondary | ICD-10-CM | POA: Insufficient documentation

## 2023-04-26 DIAGNOSIS — N070 Hereditary nephropathy, not elsewhere classified with minor glomerular abnormality: Secondary | ICD-10-CM | POA: Insufficient documentation

## 2023-04-26 DIAGNOSIS — E1122 Type 2 diabetes mellitus with diabetic chronic kidney disease: Secondary | ICD-10-CM | POA: Diagnosis not present

## 2023-04-26 DIAGNOSIS — N184 Chronic kidney disease, stage 4 (severe): Secondary | ICD-10-CM | POA: Diagnosis not present

## 2023-04-26 DIAGNOSIS — N200 Calculus of kidney: Secondary | ICD-10-CM | POA: Diagnosis not present

## 2023-04-26 DIAGNOSIS — R8290 Unspecified abnormal findings in urine: Secondary | ICD-10-CM | POA: Diagnosis not present

## 2023-04-26 DIAGNOSIS — I129 Hypertensive chronic kidney disease with stage 1 through stage 4 chronic kidney disease, or unspecified chronic kidney disease: Secondary | ICD-10-CM | POA: Diagnosis not present

## 2023-05-04 ENCOUNTER — Other Ambulatory Visit: Payer: Self-pay | Admitting: Cardiology

## 2023-05-08 DIAGNOSIS — N3020 Other chronic cystitis without hematuria: Secondary | ICD-10-CM | POA: Diagnosis not present

## 2023-05-08 DIAGNOSIS — N3941 Urge incontinence: Secondary | ICD-10-CM | POA: Diagnosis not present

## 2023-05-08 DIAGNOSIS — N202 Calculus of kidney with calculus of ureter: Secondary | ICD-10-CM | POA: Diagnosis not present

## 2023-05-17 ENCOUNTER — Telehealth: Payer: Self-pay

## 2023-05-17 NOTE — Telephone Encounter (Signed)
 Patient referred to clinic for UTI. Front desk scheduled her in next available new patient slot on 4/28. Call transferred to triage for patient's concerns regarding her immunocompromised state.   She reports she had a 5 organ transplant in 2018 and is concerned about waiting so long to be seen. Offered to place her on cancellation list and encouraged her to reach out to her transplant coordinator and referring provider to let them know when her new patient appointment is scheduled to see if they can assist with a plan in the meantime to prevent any deterioration in her condition.   She is agreeable and will call the clinic back with any changes.   [name], BSN, RN

## 2023-05-23 DIAGNOSIS — R5383 Other fatigue: Secondary | ICD-10-CM | POA: Diagnosis not present

## 2023-05-23 DIAGNOSIS — F419 Anxiety disorder, unspecified: Secondary | ICD-10-CM | POA: Diagnosis not present

## 2023-05-23 DIAGNOSIS — F3289 Other specified depressive episodes: Secondary | ICD-10-CM | POA: Diagnosis not present

## 2023-05-23 DIAGNOSIS — G4709 Other insomnia: Secondary | ICD-10-CM | POA: Diagnosis not present

## 2023-05-24 ENCOUNTER — Ambulatory Visit: Admitting: Internal Medicine

## 2023-05-24 ENCOUNTER — Other Ambulatory Visit: Payer: Self-pay

## 2023-05-24 ENCOUNTER — Encounter: Payer: Self-pay | Admitting: Internal Medicine

## 2023-05-24 VITALS — BP 137/88 | HR 56 | Temp 97.5°F | Ht 65.0 in | Wt 211.0 lb

## 2023-05-24 DIAGNOSIS — N3941 Urge incontinence: Secondary | ICD-10-CM | POA: Diagnosis not present

## 2023-05-24 DIAGNOSIS — N200 Calculus of kidney: Secondary | ICD-10-CM

## 2023-05-24 DIAGNOSIS — Z1624 Resistance to multiple antibiotics: Secondary | ICD-10-CM | POA: Diagnosis not present

## 2023-05-24 DIAGNOSIS — B961 Klebsiella pneumoniae [K. pneumoniae] as the cause of diseases classified elsewhere: Secondary | ICD-10-CM

## 2023-05-24 DIAGNOSIS — Z87440 Personal history of urinary (tract) infections: Secondary | ICD-10-CM | POA: Diagnosis not present

## 2023-05-24 DIAGNOSIS — Z944 Liver transplant status: Secondary | ICD-10-CM

## 2023-05-24 MED ORDER — METHENAMINE HIPPURATE 1 G PO TABS: 1.0000 g | ORAL_TABLET | Freq: Two times a day (BID) | ORAL | 5 refills | Status: DC

## 2023-05-24 MED ORDER — VITAMIN C 250 MG PO TABS: 250.0000 mg | ORAL_TABLET | Freq: Two times a day (BID) | ORAL | 5 refills | Status: DC

## 2023-05-24 NOTE — Progress Notes (Signed)
 [facility]  Reason for Consult:abnormal urine culture Referring Provider: [facility]     Patient Active Problem List   Diagnosis Date Noted   Retroperitoneal hemorrhage complicating cardiac catheterization, small 11/06/2020   S/P angioplasty CTO with stent  (DES) 11/04/20 of mid LAD 11/06/2020   CAD (coronary artery disease) 11/04/2020   Coronary artery disease involving native coronary artery with angina pectoris (HCC) 09/25/2020   Diarrhea 03/11/2017   Nausea & vomiting 03/11/2017   Sepsis (HCC) 03/11/2017   Dehydration 03/11/2017   Immunosuppressed status (HCC)    Influenza-like illness 03/08/2017   Acute bronchitis, viral 03/08/2017   Anemia 03/06/2017   Headache 03/06/2017   Elevated blood pressure reading 03/06/2017   Liver transplant recipient [facility]) 03/06/2017   History of pancreas transplant (HCC) 03/06/2017   Acute viral syndrome 03/06/2017   Pancytopenia (HCC) 11/13/2015   Cirrhosis of liver with ascites (HCC)    Abdominal pain 11/10/2015   Alcoholic cirrhosis of liver with ascites (HCC)    Enteropathy 07/31/2015   Anemia, blood loss    Depression with anxiety 07/30/2015   Esophageal varices in cirrhosis (HCC) 07/30/2015   Gastritis determined by endoscopy 07/30/2015   Hypotension 07/30/2015   Hyponatremia 07/29/2015   Hypokalemia 07/29/2015   GI bleeding 07/29/2015   Gastric ulcer 07/29/2015   Leukopenia 07/29/2015   AKI (acute kidney injury) (HCC) 07/29/2015   Diabetes mellitus type 2, controlled, without complications (HCC) 07/29/2015   GI bleed 07/17/2015   Palliative care encounter    Goals of care, counseling/discussion    DNR (do not resuscitate) discussion    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   Melena 07/14/2015   Hepatic cirrhosis due to chronic hepatitis C infection (HCC) 07/14/2015   History of hepatitis C - successfuly treated medically 07/14/2015   Portal vein thrombosis  05/13/2014   Other malaise and fatigue 08/05/2013   Hypoxemia 08/05/2013   Abdominal pain, right upper quadrant 05/29/2013   SBP (spontaneous bacterial peritonitis) (HCC) 05/28/2013   Distal radius fracture 10/25/2012   Metatarsal bone fracture 10/25/2012   Insomnia 09/25/2012   Hepatic encephalopathy (HCC) 06/07/2012   Hyperglycemia 06/06/2012   LUQ abdominal pain 06/05/2012   Ascites 05/14/2012   Gram-negative bacteremia 12/08/2011   Pyelonephritis 12/07/2011   Hydronephrosis of right kidney 12/07/2011   Right ureteral stone 12/07/2011   Thrombocytopenia (HCC) 12/07/2011   Cirrhosis of liver (HCC) 05/02/2011   Acute blood loss anemia 05/02/2011      HPI: [name] is a 63 y.o. female hx liver transplant, with above medical problems referred by [facility] urology for abnormal urine culture   Reviewed chart from [facility]  -recurrent urolithiasis -10/2022 finished 2 stage ureteroscopy to extract stone right ureter -05/08/23 -- renal ct continues to show small bilateral nonobstructive assymptomatic stones.  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 S/p hysterectomy and unilateral SO 2008 S/p open cholecystectomy 2008 Hx hernia repair 2008  Relevant meds: Cyclosporine 100/75 Hx skin cancer and no longer took MMF 01/2023  Labs accompanied charts: 05/08/23 labs       Review of Systems: ROS No fever, chill Appetite is good No flank pain       Past Medical History:  Diagnosis Date   Anemia    Anxiety    Ascites    Cancer (HCC)    numerous skin cancer   CKD (chronic kidney disease) stage 4, GFR 15-29 ml/min (HCC)    Coronary artery disease    Depression    Enteropathy 07/31/2015    Portal hypertensive enteropathy per capsule study, with stigmata of bleeding.   Esophageal varices (HCC)    Fibromyalgia    Gastric ulcer 07/15/2015   EGD; no stigmata of bleeding   GI bleed 07/17/2015   Gram-negative bacteremia 12/08/2011   Headache(784.0)    Heart murmur    History of cirrhosis    History of hepatitis C - successfuly treated medically 07/14/2015   History of kidney stones    Myocardial infarction 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   Type 2 diabetes mellitus (HCC)    former, prior to transplant 2018    Social History   Tobacco Use   Smoking status: Former    Current packs/day: 0.00    Average packs/day: 0.5 packs/day for 5.0 years (2.5 ttl pk-yrs)    Types: Cigarettes    Start date: 05/02/1986    Quit date: 05/02/1991    Years since quitting: 32.0   Smokeless tobacco: Never  Vaping Use   Vaping status: Never Used  Substance Use Topics   Alcohol use: No    Alcohol/week: 0.0 standard drinks of alcohol   Drug use: No    Family History  Problem Relation Age of Onset   Dementia Mother    Heart disease Father    Liver disease Father    Hypertension Father    Diabetes Father    Dementia Father    Hypertension Brother     Allergies  Allergen Reactions   Ancef [Cefazolin Sodium] Other (See Comments)    Reaction:  Vaginal and mouth blisters   Nucynta [Tapentadol]     Pt unsure of allergy   Cefazolin Rash    Blisters in mouth and vagina    Gadobenate Itching, Nausea And Vomiting and Rash   Iodinated  Contrast Media Itching, Nausea And Vomiting and Rash   Tape Dermatitis and Rash    Adhesive on Surgical Tape, blisters skin     OBJECTIVE: Vitals:   05/24/23 1009  BP: 137/88  Pulse: (!) 56  Temp: (!) 97.5 F (36.4 C)  TempSrc: Oral  SpO2: 97%  Weight: 211 lb (95.7 kg)  Height: 5\' 5"  (1.651 m)   Body mass index is 35.11 kg/m.   Physical Exam General/constitutional: no distress, pleasant HEENT: Normocephalic, PER, Conj Clear, EOMI, Oropharynx clear Neck supple CV: rrr no mrg Lungs: clear to auscultation, normal respiratory effort Abd: Soft, Nontender Ext: no edema Skin: No Rash Neuro: nonfocal MSK: no peripheral joint swelling/tenderness/warmth; back spines nontender    Lab:  Microbiology:  Serology:  Imaging:   Assessment/plan: Problem List Items Addressed This Visit   None Visit Diagnoses       Multiple drug resistant organism (MDRO) culture positive    -  Primary     Kidney stones             Discussed concept of assymptomatic bacteriuria, vs lower urinary tract sx without infection, and true infections We also discussed her history/pattern of kidney stone flare alone vs uti/pyelo We did briefly mention that data for all these do include immunosuppressed population   At this time she has sx suggestive of urge vs total incontinence but no evidence of infectious uti She does have at least assymptomatic bacteriuria with esbl klebsiella that is also resistant to all oral antibiotics   Precaution --> if patient have chill, malaise, fatigue, poor appetite and progressive unrelenting urgency/dysuria over days to 1-2 weeks would advise to go to ER as there is no oral antibiotics and IV abx setup in clinic is not ideal   Also if there is another urologic procedure I would advise giving her one dose perioperatively (1 hr before procedure) meropenem   We can also start her on methenamine/vitamin c for uti prevention   F/u as  needed      Follow-up: Return if symptoms worsen or fail to improve.  Jamesetta Mcbride, MD [facility] 05/24/2023, 10:15 AM

## 2023-05-24 NOTE — Patient Instructions (Addendum)
 Please take methenamine and vitamin c tablets -- 1 of each, twice a day To prevent uti    At this time no indication of actual infection    If urologic procedure is to occur then I recommend a dose of antibiotics meropenem before procedure   Precaution --> if you have chill, malaise, fatigue, poor appetite and progressive unrelenting urgency/dysuria over days to 1-2 weeks would advise to go to ER as there is no oral antibiotics and IV abx setup in clinic is not ideal

## 2023-05-31 DIAGNOSIS — N184 Chronic kidney disease, stage 4 (severe): Secondary | ICD-10-CM | POA: Diagnosis not present

## 2023-05-31 DIAGNOSIS — Z9482 Intestine transplant status: Secondary | ICD-10-CM | POA: Diagnosis not present

## 2023-05-31 DIAGNOSIS — I129 Hypertensive chronic kidney disease with stage 1 through stage 4 chronic kidney disease, or unspecified chronic kidney disease: Secondary | ICD-10-CM | POA: Diagnosis not present

## 2023-05-31 DIAGNOSIS — D849 Immunodeficiency, unspecified: Secondary | ICD-10-CM | POA: Diagnosis not present

## 2023-05-31 DIAGNOSIS — R8290 Unspecified abnormal findings in urine: Secondary | ICD-10-CM | POA: Diagnosis not present

## 2023-06-05 ENCOUNTER — Ambulatory Visit: Admitting: Internal Medicine

## 2023-06-05 DIAGNOSIS — N184 Chronic kidney disease, stage 4 (severe): Secondary | ICD-10-CM | POA: Diagnosis not present

## 2023-06-05 DIAGNOSIS — N2581 Secondary hyperparathyroidism of renal origin: Secondary | ICD-10-CM | POA: Diagnosis not present

## 2023-06-05 DIAGNOSIS — I129 Hypertensive chronic kidney disease with stage 1 through stage 4 chronic kidney disease, or unspecified chronic kidney disease: Secondary | ICD-10-CM | POA: Diagnosis not present

## 2023-06-05 DIAGNOSIS — E1122 Type 2 diabetes mellitus with diabetic chronic kidney disease: Secondary | ICD-10-CM | POA: Diagnosis not present

## 2023-06-15 DIAGNOSIS — N184 Chronic kidney disease, stage 4 (severe): Secondary | ICD-10-CM | POA: Diagnosis not present

## 2023-06-15 DIAGNOSIS — N3020 Other chronic cystitis without hematuria: Secondary | ICD-10-CM | POA: Diagnosis not present

## 2023-06-15 DIAGNOSIS — N3941 Urge incontinence: Secondary | ICD-10-CM | POA: Diagnosis not present

## 2023-06-15 DIAGNOSIS — R350 Frequency of micturition: Secondary | ICD-10-CM | POA: Diagnosis not present

## 2023-06-15 DIAGNOSIS — R8271 Bacteriuria: Secondary | ICD-10-CM | POA: Diagnosis not present

## 2023-06-16 ENCOUNTER — Inpatient Hospital Stay: Attending: Oncology | Admitting: Oncology

## 2023-06-16 ENCOUNTER — Encounter: Payer: Self-pay | Admitting: Oncology

## 2023-06-16 ENCOUNTER — Inpatient Hospital Stay

## 2023-06-16 VITALS — BP 128/70 | HR 90 | Temp 96.0°F | Resp 18 | Wt 216.1 lb

## 2023-06-16 DIAGNOSIS — N184 Chronic kidney disease, stage 4 (severe): Secondary | ICD-10-CM | POA: Diagnosis not present

## 2023-06-16 DIAGNOSIS — Z79621 Long term (current) use of calcineurin inhibitor: Secondary | ICD-10-CM | POA: Diagnosis not present

## 2023-06-16 DIAGNOSIS — D631 Anemia in chronic kidney disease: Secondary | ICD-10-CM | POA: Diagnosis not present

## 2023-06-16 DIAGNOSIS — Z7982 Long term (current) use of aspirin: Secondary | ICD-10-CM | POA: Insufficient documentation

## 2023-06-16 DIAGNOSIS — Z7902 Long term (current) use of antithrombotics/antiplatelets: Secondary | ICD-10-CM | POA: Diagnosis not present

## 2023-06-16 DIAGNOSIS — Z79899 Other long term (current) drug therapy: Secondary | ICD-10-CM | POA: Insufficient documentation

## 2023-06-16 LAB — IRON AND TIBC
Iron: 69 ug/dL (ref 28–170)
Saturation Ratios: 17 % (ref 10.4–31.8)
TIBC: 407 ug/dL (ref 250–450)
UIBC: 338 ug/dL

## 2023-06-16 LAB — CBC WITH DIFFERENTIAL/PLATELET
Abs Immature Granulocytes: 0 10*3/uL (ref 0.00–0.07)
Basophils Absolute: 0.1 10*3/uL (ref 0.0–0.1)
Basophils Relative: 1 %
Eosinophils Absolute: 2.4 10*3/uL — ABNORMAL HIGH (ref 0.0–0.5)
Eosinophils Relative: 21 %
HCT: 32.9 % — ABNORMAL LOW (ref 36.0–46.0)
Hemoglobin: 9.8 g/dL — ABNORMAL LOW (ref 12.0–15.0)
Lymphocytes Relative: 15 %
Lymphs Abs: 1.7 10*3/uL (ref 0.7–4.0)
MCH: 28.6 pg (ref 26.0–34.0)
MCHC: 29.8 g/dL — ABNORMAL LOW (ref 30.0–36.0)
MCV: 95.9 fL (ref 80.0–100.0)
Monocytes Absolute: 0.5 10*3/uL (ref 0.1–1.0)
Monocytes Relative: 4 %
Neutro Abs: 6.8 10*3/uL (ref 1.7–7.7)
Neutrophils Relative %: 59 %
Platelets: 347 10*3/uL (ref 150–400)
RBC: 3.43 MIL/uL — ABNORMAL LOW (ref 3.87–5.11)
RDW: 14.6 % (ref 11.5–15.5)
WBC: 11.5 10*3/uL — ABNORMAL HIGH (ref 4.0–10.5)
nRBC: 0 % (ref 0.0–0.2)

## 2023-06-16 LAB — VITAMIN B12: Vitamin B-12: 447 pg/mL (ref 180–914)

## 2023-06-16 LAB — FOLATE: Folate: 17.8 ng/mL (ref >5.9)

## 2023-06-16 LAB — FERRITIN: Ferritin: 8 ng/mL — ABNORMAL LOW (ref 11–307)

## 2023-06-16 NOTE — Patient Instructions (Signed)
 VISIT SUMMARY:  Today, you were seen for evaluation of your anemia, which is related to your chronic kidney disease. You have been feeling very tired and weak, and your hemoglobin levels have been persistently low. We discussed your medical history, including your multi-organ transplant and other health conditions. We have outlined a plan to address your anemia and other health concerns.  YOUR PLAN:  -ANEMIA IN CHRONIC KIDNEY DISEASE: Anemia is a condition where you don't have enough healthy red blood cells to carry adequate oxygen  to your body's tissues. In your case, it is likely due to your chronic kidney disease. We will order blood work to check your iron levels, and if needed, we will administer IV iron. If your hemoglobin levels remain low after iron supplementation, we may consider additional treatments. We will follow up in six weeks to see how you are responding to the treatment.  -IRON DEFICIENCY ANEMIA: Iron deficiency anemia occurs when your body doesn't have enough iron to produce adequate red blood cells. Based on your previous labs, we suspect this might be contributing to your anemia. If your current labs confirm iron deficiency, we will give you two doses of IV iron. We will also monitor you for any side effects such as allergic reactions, nausea, or headache.  -CHRONIC KIDNEY DISEASE: Chronic kidney disease is a long-term condition where your kidneys do not work as well as they should. This can contribute to anemia and fatigue, especially with your other health conditions. We will continue to monitor your kidney function and manage your blood pressure to help control this condition.  INSTRUCTIONS:  Please complete the blood work as soon as possible so we can evaluate your iron levels. We will follow up in six weeks to assess your response to the treatment. If you experience any side effects from the IV iron, such as allergic reactions, nausea, or headache, please contact our office  immediately.

## 2023-06-16 NOTE — Assessment & Plan Note (Signed)
 Likely anemia chronic kidney disease with a component of iron deficiency anemia Endoscopy colonoscopy: Up-to-date Previously received IV iron infusion in 2014 SPEP done in 04/27/2023: No M spike, free light chains are slightly elevated with a normal ratio  -Will obtain iron labs today.  If ferritin less than 400 and TSAT less than 35% will consider IV iron infusion.  Discussed adverse effects of IV iron including allergic reactions, nausea and headaches. - Start taking oral ferrous sulfate  325 mg every other day.  Take MiraLAX  for constipation - Will consider ESA if hemoglobin is less than 10 after iron supplementation  Return to clinic in 6 weeks with labs

## 2023-06-16 NOTE — Progress Notes (Signed)
 [facility] at [facility]  HEMATOLOGY NEW VISIT  Kathyleen Parkins, MD  REASON FOR REFERRAL: Anemia of chronic kidney disease   HISTORY OF PRESENT ILLNESS: [name] 63 y.o. female referred for anemia of chronic kidney.  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 [place] Pimple at [facility].  She was referred by her nephrologist for anemia. She feels very tired and weak, stating that she can hardly manage daily activities. Her anemia has been persistent, with hemoglobin levels remaining low.  Patient recalls having an iron infusion in the past 10 years ago. She has no other complaints today, denies black-colored stools or goals.  She has frequent colonoscopies and endoscopies done at [facility] as a part of her posttransplant follow-up.  She denies smoking and alcohol use.  She resides in [place] and has a colonoscopy scheduled for September as a part of her posttransplant care.  I have reviewed the past medical history, past surgical history, social history and family history with the patient   ALLERGIES:  is allergic to ancef [cefazolin sodium], nucynta [tapentadol], cefazolin, gadobenate, iodinated contrast media, and tape.  MEDICATIONS:  Current Outpatient Medications  Medication Sig Dispense Refill   amLODipine (NORVASC) 10 MG tablet Take 10 mg by mouth.     aspirin  EC 81 MG tablet Take 1 tablet (81 mg total) by mouth daily. Swallow whole. 90 tablet 3   atenolol  (TENORMIN ) 25 MG tablet Take 0.5 tablets (12.5 mg total) by mouth daily. 45 tablet 3   buPROPion (WELLBUTRIN SR) 150 MG 12 hr tablet Take 150 mg by mouth daily.     calcitRIOL  (ROCALTROL ) 0.25 MCG capsule Take 0.25 mcg by mouth daily.     clopidogrel  (PLAVIX ) 75 MG tablet take 1 tablet (75 milligram total) by mouth daily. 90 tablet 3   cycloSPORINE   (SANDIMMUNE ) 100 MG capsule Take 100 mg by mouth daily.     cycloSPORINE  (SANDIMMUNE ) 25 MG capsule Take 75 mg by mouth at bedtime.     ezetimibe  (ZETIA ) 10 MG tablet take 1 tablet once daily. 90 tablet 3   fluorouracil (EFUDEX) 5 % cream Apply topically.     methenamine  (HIPREX ) 1 g tablet Take 1 tablet (1 g total) by mouth 2 (two) times daily with a meal. 60 tablet 5   Multiple Vitamins-Minerals (SUPER THERA VITE M PO) Take 1 tablet by mouth daily.     nitroGLYCERIN  (NITROSTAT ) 0.4 MG SL tablet Place 1 tablet (0.4 mg total) under the tongue every 5 (five) minutes as needed. (Patient taking differently: Place 0.4 mg under the tongue every 5 (five) minutes as needed for chest pain.) 25 tablet 3   Oxycodone  HCl 10 MG TABS Take 1 tablet (10 mg total) by mouth every 6 (six) hours as needed (post-op pain). 15 tablet 0   pantoprazole  (PROTONIX ) 40 MG tablet Take 40 mg by mouth at bedtime.     pravastatin  (PRAVACHOL ) 40 MG tablet Take 1 tablet (40 mg total) by mouth every evening. 90 tablet 3   solifenacin (VESICARE) 5 MG tablet Take 5 mg by mouth daily.     traZODone  (DESYREL ) 100 MG tablet Take 100 mg by mouth at bedtime.     TRINTELLIX 10 MG TABS tablet Take 10 mg by mouth daily.     vitamin C  (ASCORBIC ACID ) 250 MG tablet Take 1 tablet (  250 mg total) by mouth 2 (two) times daily. 60 tablet 5   No current facility-administered medications for this visit.     REVIEW OF SYSTEMS:   Constitutional: Denies fevers, chills or night sweats Eyes: Denies blurriness of vision Ears, nose, mouth, throat, and face: Denies mucositis or sore throat Respiratory: Denies cough, dyspnea or wheezes Cardiovascular: Denies palpitation, chest discomfort or lower extremity swelling Gastrointestinal:  Denies nausea, heartburn or change in bowel habits Skin: Denies abnormal skin rashes Lymphatics: Denies new lymphadenopathy or easy bruising Neurological:Denies numbness, tingling or new weaknesses Behavioral/Psych:  Mood is stable, no new changes  All other systems were reviewed with the patient and are negative.  PHYSICAL EXAMINATION:   Vitals:   06/16/23 1051  BP: 128/70  Pulse: 90  Resp: 18  Temp: (!) 96 F (35.6 C)  SpO2: 100%    GENERAL:alert, no distress and comfortable LUNGS: clear to auscultation and percussion with normal breathing effort HEART: regular rate & rhythm and no murmurs and no lower extremity edema ABDOMEN:abdomen soft, non-tender and normal bowel sounds Musculoskeletal:no cyanosis of digits and no clubbing  NEURO: alert & oriented x 3 with fluent speech  LABORATORY DATA:  I have reviewed the data as listed and labs from the nephrology office drawn on 05/31/2023 CMP: Sodium: 152, potassium: 5.3, BUN: 45, creatinine: 2.73 CBC: WBC: 7.5, hemoglobin: 9.3, hematocrit: 30.4, MCV: 91, platelets: 327  SPEP from 04/26/2023: No M spike, IFE Free kappa light chain: 60.9, free lambda light chains: 44.4, ratio: 1.37   Lab Results  Component Value Date   WBC 11.5 (H) 06/16/2023   NEUTROABS 6.8 06/16/2023   HGB 9.8 (L) 06/16/2023   HCT 32.9 (L) 06/16/2023   MCV 95.9 06/16/2023   PLT 347 06/16/2023      Chemistry      Component Value Date/Time   NA 139 12/09/2022 1029   NA 140 10/07/2020 1047   K 3.7 12/09/2022 1029   CL 109 12/09/2022 1029   CO2 19 (L) 12/09/2022 1029   BUN 23 12/09/2022 1029   BUN 21 10/07/2020 1047   CREATININE 2.72 (H) 12/09/2022 1029   CREATININE 1.24 (H) 07/19/2016 1650      Component Value Date/Time   CALCIUM  8.6 (L) 12/09/2022 1029   ALKPHOS 98 12/09/2022 1029   AST 39 12/09/2022 1029   ALT 40 12/09/2022 1029   BILITOT 0.2 (L) 12/09/2022 1029   BILITOT 0.3 11/26/2020 1008       RADIOGRAPHIC STUDIES: I have personally reviewed the radiological images as listed and agreed with the findings in the report. US  RENAL CLINICAL DATA:  Stage 4 chronic kidney disease.  EXAM: RENAL / URINARY TRACT ULTRASOUND COMPLETE  COMPARISON:  CT  abdomen and pelvis April 13, 2023  FINDINGS: Right Kidney:  Renal measurements: 11.5 x 5.8 x 4.3 cm = volume: 148.4 mL. Increased echotexture. 13 mm nonobstructing stone is identified in the right kidney. No hydronephrosis. No renal mass.  Left Kidney:  Renal measurements: 10.3 x 3.7 x 4.3 cm = volume: 88 mL. 4.9 mm nonobstructing stone noted in the lower pole left kidney. Echogenicity within normal limits. No mass or hydronephrosis visualized.  Bladder:  Appears normal for degree of bladder distention.  Other:  None.  IMPRESSION: 1. Bilateral nonobstructing renal stones. No hydronephrosis bilaterally. 2. Increased echotexture of the right kidney, consistent with medical renal disease.  Electronically Signed   By: Anna Barnes M.D.   On: 05/04/2023 13:36   ASSESSMENT & PLAN:  Patient  is a 63 y.o. female referred for anemia  Anemia Likely anemia chronic kidney disease with a component of iron deficiency anemia Endoscopy colonoscopy: Up-to-date Previously received IV iron infusion in 2014 SPEP done in 04/27/2023: No M spike, free light chains are slightly elevated with a normal ratio  -Will obtain iron labs today.  If ferritin less than 400 and TSAT less than 35% will consider IV iron infusion.  Discussed adverse effects of IV iron including allergic reactions, nausea and headaches. - Start taking oral ferrous sulfate  325 mg every other day.  Take MiraLAX  for constipation - Will consider ESA if hemoglobin is less than 10 after iron supplementation  Return to clinic in 6 weeks with labs   Orders Placed This Encounter  Procedures   Ferritin    Standing Status:   Future    Number of Occurrences:   1    Expected Date:   06/16/2023    Expiration Date:   06/15/2024   Folate    Standing Status:   Future    Number of Occurrences:   1    Expected Date:   06/16/2023    Expiration Date:   06/15/2024   Vitamin B12    Standing Status:   Future    Number of Occurrences:   1     Expected Date:   06/16/2023    Expiration Date:   06/15/2024   CBC with Differential/Platelet    Standing Status:   Future    Number of Occurrences:   1    Expected Date:   06/16/2023    Expiration Date:   06/15/2024   Iron and TIBC    Standing Status:   Future    Number of Occurrences:   1    Expected Date:   06/16/2023    Expiration Date:   06/15/2024   Ferritin    Standing Status:   Future    Expected Date:   07/25/2023    Expiration Date:   06/15/2024   Folate    Standing Status:   Future    Expected Date:   07/25/2023    Expiration Date:   06/15/2024   Vitamin B12    Standing Status:   Future    Expected Date:   07/25/2023    Expiration Date:   06/15/2024   CBC with Differential/Platelet    Standing Status:   Future    Expected Date:   07/25/2023    Expiration Date:   06/15/2024   Comprehensive metabolic panel with GFR    Standing Status:   Future    Expected Date:   07/25/2023    Expiration Date:   06/15/2024   Iron and TIBC    Standing Status:   Future    Expected Date:   07/25/2023    Expiration Date:   06/15/2024    The total time spent in the appointment was 60 minutes encounter with patients including review of chart and various tests results, discussions about plan of care and coordination of care plan   All questions were answered. The patient knows to call the clinic with any problems, questions or concerns. No barriers to learning was detected.   Eduardo Grade, MD 5/9/20252:06 PM

## 2023-06-19 ENCOUNTER — Other Ambulatory Visit (HOSPITAL_COMMUNITY): Payer: Self-pay | Admitting: Gastroenterology

## 2023-06-19 DIAGNOSIS — Z9482 Intestine transplant status: Secondary | ICD-10-CM

## 2023-06-19 DIAGNOSIS — Z944 Liver transplant status: Secondary | ICD-10-CM

## 2023-06-21 ENCOUNTER — Other Ambulatory Visit: Payer: Self-pay | Admitting: Oncology

## 2023-06-21 DIAGNOSIS — D509 Iron deficiency anemia, unspecified: Secondary | ICD-10-CM

## 2023-06-21 DIAGNOSIS — Z944 Liver transplant status: Secondary | ICD-10-CM | POA: Diagnosis not present

## 2023-06-21 DIAGNOSIS — Z9482 Intestine transplant status: Secondary | ICD-10-CM | POA: Diagnosis not present

## 2023-06-22 ENCOUNTER — Inpatient Hospital Stay

## 2023-06-22 VITALS — BP 123/77 | HR 84 | Temp 98.5°F | Resp 19

## 2023-06-22 DIAGNOSIS — Z7902 Long term (current) use of antithrombotics/antiplatelets: Secondary | ICD-10-CM | POA: Diagnosis not present

## 2023-06-22 DIAGNOSIS — Z7982 Long term (current) use of aspirin: Secondary | ICD-10-CM | POA: Diagnosis not present

## 2023-06-22 DIAGNOSIS — Z79621 Long term (current) use of calcineurin inhibitor: Secondary | ICD-10-CM | POA: Diagnosis not present

## 2023-06-22 DIAGNOSIS — D631 Anemia in chronic kidney disease: Secondary | ICD-10-CM | POA: Diagnosis not present

## 2023-06-22 DIAGNOSIS — Z79899 Other long term (current) drug therapy: Secondary | ICD-10-CM | POA: Diagnosis not present

## 2023-06-22 DIAGNOSIS — D509 Iron deficiency anemia, unspecified: Secondary | ICD-10-CM

## 2023-06-22 DIAGNOSIS — N184 Chronic kidney disease, stage 4 (severe): Secondary | ICD-10-CM | POA: Diagnosis not present

## 2023-06-22 MED ORDER — CETIRIZINE HCL 10 MG PO TABS
10.0000 mg | ORAL_TABLET | Freq: Once | ORAL | Status: AC
  Administered 2023-06-22: 10 mg via ORAL
  Filled 2023-06-22: qty 1

## 2023-06-22 MED ORDER — SODIUM CHLORIDE 0.9 % IV SOLN: INTRAVENOUS | Status: DC

## 2023-06-22 MED ORDER — ACETAMINOPHEN 325 MG PO TABS
650.0000 mg | ORAL_TABLET | Freq: Once | ORAL | Status: AC
  Administered 2023-06-22: 650 mg via ORAL
  Filled 2023-06-22: qty 2

## 2023-06-22 MED ORDER — SODIUM CHLORIDE 0.9 % IV SOLN
510.0000 mg | Freq: Once | INTRAVENOUS | Status: AC
  Administered 2023-06-22: 510 mg via INTRAVENOUS
  Filled 2023-06-22: qty 510

## 2023-06-22 NOTE — Progress Notes (Signed)
 Patient tolerated iron infusion with no complaints voiced.  Peripheral IV site clean and dry with good blood return noted before and after infusion.  Band aid applied.  Pt observed for 30 minutes post iron infusion without any complications. RN educated pt on the importance of notifying the clinic if any complications occur, pt verbalized understanding and all questions answered. VSS with discharge and left in satisfactory condition with no s/s of distress noted. All follow ups as scheduled.   Shikira Folino [employer]

## 2023-06-22 NOTE — Patient Instructions (Signed)

## 2023-06-26 ENCOUNTER — Ambulatory Visit (HOSPITAL_COMMUNITY)
Admission: RE | Admit: 2023-06-26 | Discharge: 2023-06-26 | Disposition: A | Discharge status: Home / Self Care | Source: Ambulatory Visit | Attending: Gastroenterology | Admitting: Gastroenterology

## 2023-06-26 DIAGNOSIS — K7689 Other specified diseases of liver: Secondary | ICD-10-CM | POA: Diagnosis not present

## 2023-06-26 DIAGNOSIS — R188 Other ascites: Secondary | ICD-10-CM | POA: Diagnosis not present

## 2023-06-26 DIAGNOSIS — R7989 Other specified abnormal findings of blood chemistry: Secondary | ICD-10-CM | POA: Diagnosis not present

## 2023-06-26 DIAGNOSIS — Z9482 Intestine transplant status: Secondary | ICD-10-CM | POA: Insufficient documentation

## 2023-06-26 DIAGNOSIS — Z944 Liver transplant status: Secondary | ICD-10-CM | POA: Diagnosis not present

## 2023-06-29 ENCOUNTER — Inpatient Hospital Stay

## 2023-06-29 VITALS — BP 132/75 | HR 79 | Temp 97.7°F | Resp 18

## 2023-06-29 DIAGNOSIS — Z79899 Other long term (current) drug therapy: Secondary | ICD-10-CM | POA: Diagnosis not present

## 2023-06-29 DIAGNOSIS — Z7902 Long term (current) use of antithrombotics/antiplatelets: Secondary | ICD-10-CM | POA: Diagnosis not present

## 2023-06-29 DIAGNOSIS — D509 Iron deficiency anemia, unspecified: Secondary | ICD-10-CM

## 2023-06-29 DIAGNOSIS — N184 Chronic kidney disease, stage 4 (severe): Secondary | ICD-10-CM | POA: Diagnosis not present

## 2023-06-29 DIAGNOSIS — Z7982 Long term (current) use of aspirin: Secondary | ICD-10-CM | POA: Diagnosis not present

## 2023-06-29 DIAGNOSIS — Z79621 Long term (current) use of calcineurin inhibitor: Secondary | ICD-10-CM | POA: Diagnosis not present

## 2023-06-29 DIAGNOSIS — D631 Anemia in chronic kidney disease: Secondary | ICD-10-CM | POA: Diagnosis not present

## 2023-06-29 MED ORDER — SODIUM CHLORIDE 0.9 % IV SOLN
510.0000 mg | Freq: Once | INTRAVENOUS | Status: AC
  Administered 2023-06-29: 510 mg via INTRAVENOUS
  Filled 2023-06-29: qty 510

## 2023-06-29 MED ORDER — SODIUM CHLORIDE 0.9 % IV SOLN: INTRAVENOUS | Status: DC

## 2023-06-29 MED ORDER — ACETAMINOPHEN 325 MG PO TABS
650.0000 mg | ORAL_TABLET | Freq: Once | ORAL | Status: AC
  Administered 2023-06-29: 650 mg via ORAL
  Filled 2023-06-29: qty 2

## 2023-06-29 MED ORDER — CETIRIZINE HCL 10 MG PO TABS
10.0000 mg | ORAL_TABLET | Freq: Once | ORAL | Status: AC
  Administered 2023-06-29: 10 mg via ORAL
  Filled 2023-06-29: qty 1

## 2023-06-29 NOTE — Progress Notes (Signed)
Patient presents today for iron infusion.  Patient is in satisfactory condition with no new complaints voiced.  Vital signs are stable.  We will proceed with infusion per provider orders.   Patient tolerated treatment well with no complaints voiced.  Patient left ambulatory in stable condition.  Vital signs stable at discharge.  Follow up as scheduled.    

## 2023-06-29 NOTE — Patient Instructions (Signed)
 [facility] - A DEPT OF [facility]  Discharge Instructions: Thank you for choosing [facility] to provide your oncology and hematology care.  If you have a lab appointment with the Cancer Center - please note that after April 8th, 2024, all labs will be drawn in the cancer center.  You do not have to check in or register with the main entrance as you have in the past but will complete your check-in in the cancer center.  Wear comfortable clothing and clothing appropriate for easy access to any Portacath or PICC line.   We strive to give you quality time with your provider. You may need to reschedule your appointment if you arrive late (15 or more minutes).  Arriving late affects you and other patients whose appointments are after yours.  Also, if you miss three or more appointments without notifying the office, you may be dismissed from the clinic at the provider's discretion.      For prescription refill requests, have your pharmacy contact our office and allow 72 hours for refills to be completed.    Today you received the following:  Feraheme.  Ferumoxytol Injection What is this medication? FERUMOXYTOL (FER ue MOX i tol) treats low levels of iron in your body (iron deficiency anemia). Iron is a mineral that plays an important role in making red blood cells, which carry oxygen from your lungs to the rest of your body. This medicine may be used for other purposes; ask your health care provider or pharmacist if you have questions. COMMON BRAND NAME(S): Feraheme What should I tell my care team before I take this medication? They need to know if you have any of these conditions: Anemia not caused by low iron levels High levels of iron in the blood Magnetic resonance imaging (MRI) test scheduled An unusual or allergic reaction to iron, other medications, foods, dyes, or preservatives Pregnant or trying to get pregnant Breastfeeding How should I  use this medication? This medication is injected into a vein. It is given by your care team in a hospital or clinic setting. Talk to your care team the use of this medication in children. Special care may be needed. Overdosage: If you think you have taken too much of this medicine contact a poison control center or emergency room at once. NOTE: This medicine is only for you. Do not share this medicine with others. What if I miss a dose? It is important not to miss your dose. Call your care team if you are unable to keep an appointment. What may interact with this medication? Other iron products This list may not describe all possible interactions. Give your health care provider a list of all the medicines, herbs, non-prescription drugs, or dietary supplements you use. Also tell them if you smoke, drink alcohol, or use illegal drugs. Some items may interact with your medicine. What should I watch for while using this medication? Visit your care team for regular checks on your progress. Tell your care team if your symptoms do not start to get better or if they get worse. You may need blood work done while you are taking this medication. You may need to eat more foods that contain iron. Talk to your care team. Foods that contain iron include whole grains or cereals, dried fruits, beans, peas, leafy green vegetables, and organ meats (liver, kidney). What side effects may I notice from receiving this medication? Side effects that you should  report to your care team as soon as possible: Allergic reactions--skin rash, itching, hives, swelling of the face, lips, tongue, or throat Low blood pressure--dizziness, feeling faint or lightheaded, blurry vision Shortness of breath Side effects that usually do not require medical attention (report to your care team if they continue or are bothersome): Flushing Headache Joint pain Muscle pain Nausea Pain, redness, or irritation at injection site This list  may not describe all possible side effects. Call your doctor for medical advice about side effects. You may report side effects to FDA at 1-800-FDA-1088. Where should I keep my medication? This medication is given in a hospital or clinic. It will not be stored at home. NOTE: This sheet is a summary. It may not cover all possible information. If you have questions about this medicine, talk to your doctor, pharmacist, or health care provider.  2024 Elsevier/Gold Standard (2022-09-14 00:00:00)    To help prevent nausea and vomiting after your treatment, we encourage you to take your nausea medication as directed.  BELOW ARE SYMPTOMS THAT SHOULD BE REPORTED IMMEDIATELY: *FEVER GREATER THAN 100.4 F (38 C) OR HIGHER *CHILLS OR SWEATING *NAUSEA AND VOMITING THAT IS NOT CONTROLLED WITH YOUR NAUSEA MEDICATION *UNUSUAL SHORTNESS OF BREATH *UNUSUAL BRUISING OR BLEEDING *URINARY PROBLEMS (pain or burning when urinating, or frequent urination) *BOWEL PROBLEMS (unusual diarrhea, constipation, pain near the anus) TENDERNESS IN MOUTH AND THROAT WITH OR WITHOUT PRESENCE OF ULCERS (sore throat, sores in mouth, or a toothache) UNUSUAL RASH, SWELLING OR PAIN  UNUSUAL VAGINAL DISCHARGE OR ITCHING   Items with * indicate a potential emergency and should be followed up as soon as possible or go to the Emergency Department if any problems should occur.  Please show the CHEMOTHERAPY ALERT CARD or IMMUNOTHERAPY ALERT CARD at check-in to the Emergency Department and triage nurse.  Should you have questions after your visit or need to cancel or reschedule your appointment, please contact [facility] [place] - A DEPT OF [facility] [phone]  and follow the prompts.  Office hours are 8:00 a.m. to 4:30 p.m. Monday - Friday. Please note that voicemails left after 4:00 p.m. may not be returned until the following business day.  We are closed weekends and major holidays. You have access to a  nurse at all times for urgent questions. Please call the main number to the clinic [phone] and follow the prompts.  For any non-urgent questions, you may also contact your provider using MyChart. We now offer e-Visits for anyone 39 and older to request care online for non-urgent symptoms. For details visit [email or web address].   Also download the MyChart app! Go to the app store, search "MyChart", open the app, select [place], and log in with your MyChart username and password.

## 2023-07-04 DIAGNOSIS — I129 Hypertensive chronic kidney disease with stage 1 through stage 4 chronic kidney disease, or unspecified chronic kidney disease: Secondary | ICD-10-CM | POA: Diagnosis not present

## 2023-07-04 DIAGNOSIS — N2581 Secondary hyperparathyroidism of renal origin: Secondary | ICD-10-CM | POA: Diagnosis not present

## 2023-07-04 DIAGNOSIS — E1122 Type 2 diabetes mellitus with diabetic chronic kidney disease: Secondary | ICD-10-CM | POA: Diagnosis not present

## 2023-07-04 DIAGNOSIS — N184 Chronic kidney disease, stage 4 (severe): Secondary | ICD-10-CM | POA: Diagnosis not present

## 2023-07-10 DIAGNOSIS — E1122 Type 2 diabetes mellitus with diabetic chronic kidney disease: Secondary | ICD-10-CM | POA: Diagnosis not present

## 2023-07-10 DIAGNOSIS — N184 Chronic kidney disease, stage 4 (severe): Secondary | ICD-10-CM | POA: Diagnosis not present

## 2023-07-10 DIAGNOSIS — I129 Hypertensive chronic kidney disease with stage 1 through stage 4 chronic kidney disease, or unspecified chronic kidney disease: Secondary | ICD-10-CM | POA: Diagnosis not present

## 2023-07-10 DIAGNOSIS — N2581 Secondary hyperparathyroidism of renal origin: Secondary | ICD-10-CM | POA: Diagnosis not present

## 2023-07-12 DIAGNOSIS — Z6835 Body mass index (BMI) 35.0-35.9, adult: Secondary | ICD-10-CM | POA: Diagnosis not present

## 2023-07-12 DIAGNOSIS — I8500 Esophageal varices without bleeding: Secondary | ICD-10-CM | POA: Diagnosis not present

## 2023-07-12 DIAGNOSIS — N184 Chronic kidney disease, stage 4 (severe): Secondary | ICD-10-CM | POA: Diagnosis not present

## 2023-07-12 DIAGNOSIS — Z944 Liver transplant status: Secondary | ICD-10-CM | POA: Diagnosis not present

## 2023-07-12 DIAGNOSIS — I10 Essential (primary) hypertension: Secondary | ICD-10-CM | POA: Diagnosis not present

## 2023-07-12 DIAGNOSIS — E6609 Other obesity due to excess calories: Secondary | ICD-10-CM | POA: Diagnosis not present

## 2023-07-12 DIAGNOSIS — K7460 Unspecified cirrhosis of liver: Secondary | ICD-10-CM | POA: Diagnosis not present

## 2023-07-20 ENCOUNTER — Inpatient Hospital Stay: Attending: Oncology

## 2023-07-20 DIAGNOSIS — N184 Chronic kidney disease, stage 4 (severe): Secondary | ICD-10-CM | POA: Insufficient documentation

## 2023-07-20 DIAGNOSIS — D631 Anemia in chronic kidney disease: Secondary | ICD-10-CM | POA: Insufficient documentation

## 2023-07-20 LAB — COMPREHENSIVE METABOLIC PANEL WITH GFR
ALT: 22 U/L (ref 0–44)
AST: 15 U/L (ref 15–41)
Albumin: 3.5 g/dL (ref 3.5–5.0)
Alkaline Phosphatase: 154 U/L — ABNORMAL HIGH (ref 38–126)
Anion gap: 9 (ref 5–15)
BUN: 37 mg/dL — ABNORMAL HIGH (ref 8–23)
CO2: 21 mmol/L — ABNORMAL LOW (ref 22–32)
Calcium: 8.6 mg/dL — ABNORMAL LOW (ref 8.9–10.3)
Chloride: 110 mmol/L (ref 98–111)
Creatinine, Ser: 2.7 mg/dL — ABNORMAL HIGH (ref 0.44–1.00)
GFR, Estimated: 19 mL/min — ABNORMAL LOW (ref >60)
Glucose, Bld: 112 mg/dL — ABNORMAL HIGH (ref 70–99)
Potassium: 4.6 mmol/L (ref 3.5–5.1)
Sodium: 140 mmol/L (ref 135–145)
Total Bilirubin: 0.6 mg/dL (ref 0.0–1.2)
Total Protein: 7.3 g/dL (ref 6.5–8.1)

## 2023-07-20 LAB — CBC WITH DIFFERENTIAL/PLATELET
Abs Immature Granulocytes: 0.04 10*3/uL (ref 0.00–0.07)
Basophils Absolute: 0.1 10*3/uL (ref 0.0–0.1)
Basophils Relative: 1 %
Eosinophils Absolute: 0.5 10*3/uL (ref 0.0–0.5)
Eosinophils Relative: 7 %
HCT: 36.4 % (ref 36.0–46.0)
Hemoglobin: 11 g/dL — ABNORMAL LOW (ref 12.0–15.0)
Immature Granulocytes: 1 %
Lymphocytes Relative: 19 %
Lymphs Abs: 1.4 10*3/uL (ref 0.7–4.0)
MCH: 29.5 pg (ref 26.0–34.0)
MCHC: 30.2 g/dL (ref 30.0–36.0)
MCV: 97.6 fL (ref 80.0–100.0)
Monocytes Absolute: 0.5 10*3/uL (ref 0.1–1.0)
Monocytes Relative: 6 %
Neutro Abs: 5 10*3/uL (ref 1.7–7.7)
Neutrophils Relative %: 66 %
Platelets: 320 10*3/uL (ref 150–400)
RBC: 3.73 MIL/uL — ABNORMAL LOW (ref 3.87–5.11)
RDW: 15.4 % (ref 11.5–15.5)
WBC: 7.4 10*3/uL (ref 4.0–10.5)
nRBC: 0 % (ref 0.0–0.2)

## 2023-07-20 LAB — IRON AND TIBC
Iron: 86 ug/dL (ref 28–170)
Saturation Ratios: 31 % (ref 10.4–31.8)
TIBC: 276 ug/dL (ref 250–450)
UIBC: 190 ug/dL

## 2023-07-20 LAB — FOLATE: Folate: 15.9 ng/mL (ref >5.9)

## 2023-07-20 LAB — VITAMIN B12: Vitamin B-12: 638 pg/mL (ref 180–914)

## 2023-07-20 LAB — FERRITIN: Ferritin: 220 ng/mL (ref 11–307)

## 2023-07-21 DIAGNOSIS — Z1159 Encounter for screening for other viral diseases: Secondary | ICD-10-CM | POA: Diagnosis not present

## 2023-07-21 DIAGNOSIS — Z114 Encounter for screening for human immunodeficiency virus [HIV]: Secondary | ICD-10-CM | POA: Diagnosis not present

## 2023-07-21 DIAGNOSIS — Z9482 Intestine transplant status: Secondary | ICD-10-CM | POA: Diagnosis not present

## 2023-07-21 DIAGNOSIS — Z7682 Awaiting organ transplant status: Secondary | ICD-10-CM | POA: Diagnosis not present

## 2023-07-21 DIAGNOSIS — E1122 Type 2 diabetes mellitus with diabetic chronic kidney disease: Secondary | ICD-10-CM | POA: Diagnosis not present

## 2023-07-21 DIAGNOSIS — N185 Chronic kidney disease, stage 5: Secondary | ICD-10-CM | POA: Diagnosis not present

## 2023-07-21 DIAGNOSIS — Z01818 Encounter for other preprocedural examination: Secondary | ICD-10-CM | POA: Diagnosis not present

## 2023-07-21 DIAGNOSIS — I120 Hypertensive chronic kidney disease with stage 5 chronic kidney disease or end stage renal disease: Secondary | ICD-10-CM | POA: Diagnosis not present

## 2023-07-27 ENCOUNTER — Inpatient Hospital Stay: Admitting: Oncology

## 2023-07-27 ENCOUNTER — Other Ambulatory Visit: Payer: Self-pay | Admitting: *Deleted

## 2023-07-27 VITALS — BP 99/61 | HR 65 | Temp 97.9°F | Resp 16

## 2023-07-27 DIAGNOSIS — D649 Anemia, unspecified: Secondary | ICD-10-CM | POA: Diagnosis not present

## 2023-07-27 DIAGNOSIS — N184 Chronic kidney disease, stage 4 (severe): Secondary | ICD-10-CM | POA: Diagnosis not present

## 2023-07-27 DIAGNOSIS — D631 Anemia in chronic kidney disease: Secondary | ICD-10-CM | POA: Diagnosis not present

## 2023-07-27 NOTE — Progress Notes (Signed)
 [facility] at [facility]  HEMATOLOGY FOLLOW-UP VISIT  Kathyleen Parkins, MD  REASON FOR FOLLOW-UP: Anemia of chronic kidney disease  ASSESSMENT & PLAN:  Patient is a 63 y.o. female following for anemia of chronic kidney disease with iron deficiency  Anemia Likely anemia chronic kidney disease with a component of iron deficiency anemia Endoscopy colonoscopy: Up-to-date Previously received IV iron infusion in 2014 SPEP done in 04/27/2023: No M spike, free light chains are slightly elevated with a normal ratio Labs significantly improved with iron replacement She has been evaluated at [facility] for kidney transplant  - Continue oral ferrous sulfate  325 mg every other day.  Take MiraLAX  for constipation - Will consider ESA if hemoglobin is less than 10 after iron supplementation  Return to clinic in 3 months with labs   Orders Placed This Encounter  Procedures   Ferritin    Standing Status:   Future    Expected Date:   10/23/2023    Expiration Date:   01/21/2024   Folate    Standing Status:   Future    Expected Date:   10/23/2023    Expiration Date:   01/21/2024   Vitamin B12    Standing Status:   Future    Expected Date:   10/23/2023    Expiration Date:   01/21/2024   CBC with Differential/Platelet    Standing Status:   Future    Expected Date:   10/23/2023    Expiration Date:   01/21/2024   Comprehensive metabolic panel with GFR    Standing Status:   Future    Expected Date:   10/23/2023    Expiration Date:   01/21/2024   Iron and TIBC    Standing Status:   Future    Expected Date:   10/23/2023    Expiration Date:   01/21/2024    The total time spent in the appointment was 20 minutes encounter with patients including review of chart and various tests results, discussions about plan of care and coordination of care plan   All questions were answered. The patient knows to call the clinic with any problems, questions or concerns. No barriers to learning  was detected.  Eduardo Grade, MD 6/19/20251:27 PM   SUMMARY OF HEMATOLOGIC HISTORY: Normocytic anemia: Anemia of chronic kidney disease along with iron deficiency, cirrhosis. - S/p IVFeraheme 510 mg X2 doses on 06/22/2023 and 06/29/2023    INTERVAL HISTORY: [name] 63 y.o. female following for anemia.  Patient reports she is being evaluated by [facility] kidney transplant team for probable kidney transplant.  She has no complaints today, fatigue is at baseline.  Patient reports significant improvement after iron replacement.  I have reviewed the past medical history, past surgical history, social history and family history with the patient   ALLERGIES:  is allergic to ancef [cefazolin sodium], nucynta [tapentadol], cefazolin, gadobenate, iodinated contrast media, and tape.  MEDICATIONS:  Current Outpatient Medications  Medication Sig Dispense Refill   amLODipine (NORVASC) 10 MG tablet Take 10 mg by mouth.     aspirin  EC 81 MG tablet Take 1 tablet (81 mg total) by mouth daily. Swallow whole. 90 tablet 3   atenolol  (TENORMIN ) 25 MG tablet Take 0.5 tablets (12.5 mg total) by mouth daily. 45 tablet 3   buPROPion (WELLBUTRIN SR) 150 MG 12 hr tablet Take 150 mg by mouth daily.     calcitRIOL  (ROCALTROL ) 0.25 MCG capsule Take 0.25 mcg by mouth daily.  clopidogrel  (PLAVIX ) 75 MG tablet take 1 tablet (75 milligram total) by mouth daily. 90 tablet 3   cycloSPORINE  (SANDIMMUNE ) 100 MG capsule Take 100 mg by mouth daily.     cycloSPORINE  (SANDIMMUNE ) 25 MG capsule Take 75 mg by mouth at bedtime.     cycloSPORINE  modified (NEORAL ) 100 MG capsule Take 100 mg by mouth.     cycloSPORINE  modified (NEORAL ) 25 MG capsule Take 75 mg by mouth.     ezetimibe  (ZETIA ) 10 MG tablet take 1 tablet once daily. 90 tablet 3   fluorouracil (EFUDEX) 5 % cream Apply topically.     methenamine  (HIPREX ) 1 g tablet Take 1 tablet (1 g total) by mouth 2 (two) times daily with a meal. 60 tablet 5   Oxycodone  HCl 10 MG  TABS Take 1 tablet (10 mg total) by mouth every 6 (six) hours as needed (post-op pain). 15 tablet 0   pantoprazole  (PROTONIX ) 40 MG tablet Take 40 mg by mouth at bedtime.     phentermine (ADIPEX-P) 37.5 MG tablet Take 37.5 mg by mouth daily.     pravastatin  (PRAVACHOL ) 40 MG tablet Take 1 tablet (40 mg total) by mouth every evening. 90 tablet 3   solifenacin (VESICARE) 5 MG tablet Take 5 mg by mouth daily.     traZODone  (DESYREL ) 100 MG tablet Take 100 mg by mouth at bedtime.     TRINTELLIX 10 MG TABS tablet Take 10 mg by mouth daily.     vitamin C  (ASCORBIC ACID ) 250 MG tablet Take 1 tablet (250 mg total) by mouth 2 (two) times daily. 60 tablet 5   nitroGLYCERIN  (NITROSTAT ) 0.4 MG SL tablet Place 1 tablet (0.4 mg total) under the tongue every 5 (five) minutes as needed. (Patient taking differently: Place 0.4 mg under the tongue every 5 (five) minutes as needed for chest pain.) 25 tablet 3   No current facility-administered medications for this visit.     REVIEW OF SYSTEMS:   Constitutional: Denies fevers, chills or night sweats Eyes: Denies blurriness of vision Ears, nose, mouth, throat, and face: Denies mucositis or sore throat Respiratory: Denies cough, dyspnea or wheezes Cardiovascular: Denies palpitation, chest discomfort or lower extremity swelling Gastrointestinal:  Denies nausea, heartburn or change in bowel habits Skin: Denies abnormal skin rashes Lymphatics: Denies new lymphadenopathy or easy bruising Neurological:Denies numbness, tingling or new weaknesses Behavioral/Psych: Mood is stable, no new changes  All other systems were reviewed with the patient and are negative.  PHYSICAL EXAMINATION:   Vitals:   07/27/23 1317  BP: 99/61  Pulse: 65  Resp: 16  Temp: 97.9 F (36.6 C)  SpO2: 99%    GENERAL:alert, no distress and comfortable SKIN: skin color, texture, turgor are normal, no rashes or significant lesions LUNGS: clear to auscultation and percussion with normal  breathing effort HEART: regular rate & rhythm and no murmurs and no lower extremity edema ABDOMEN:abdomen soft, non-tender and normal bowel sounds Musculoskeletal:no cyanosis of digits and no clubbing  NEURO: alert & oriented x 3 with fluent speech  LABORATORY DATA:  I have reviewed the data as listed  SPEP from 04/26/2023: No M spike, IFE Free kappa light chain: 60.9, free lambda light chains: 44.4, ratio: 1.37  Lab Results  Component Value Date   WBC 7.4 07/20/2023   NEUTROABS 5.0 07/20/2023   HGB 11.0 (L) 07/20/2023   HCT 36.4 07/20/2023   MCV 97.6 07/20/2023   PLT 320 07/20/2023      Chemistry  Component Value Date/Time   NA 140 07/20/2023 1107   NA 140 10/07/2020 1047   K 4.6 07/20/2023 1107   CL 110 07/20/2023 1107   CO2 21 (L) 07/20/2023 1107   BUN 37 (H) 07/20/2023 1107   BUN 21 10/07/2020 1047   CREATININE 2.70 (H) 07/20/2023 1107   CREATININE 1.24 (H) 07/19/2016 1650      Component Value Date/Time   CALCIUM  8.6 (L) 07/20/2023 1107   ALKPHOS 154 (H) 07/20/2023 1107   AST 15 07/20/2023 1107   ALT 22 07/20/2023 1107   BILITOT 0.6 07/20/2023 1107   BILITOT 0.3 11/26/2020 1008      Latest Reference Range & Units 07/20/23 11:06  Iron 28 - 170 ug/dL 86  UIBC ug/dL 295  TIBC 621 - 308 ug/dL 657  Saturation Ratios 10.4 - 31.8 % 31  Ferritin 11 - 307 ng/mL 220  Folate >5.9 ng/mL 15.9  Vitamin B12 180 - 914 pg/mL 638     RADIOGRAPHIC STUDIES: I have personally reviewed the radiological images as listed and agreed with the findings in the report.  US  LIVER DOPPLER CLINICAL DATA:  Post organ transplant. Increased LFTs. Decreased kidney function.  EXAM: DUPLEX ULTRASOUND OF LIVER  TECHNIQUE: Color and duplex Doppler ultrasound was performed to evaluate the hepatic in-flow and out-flow vessels.  COMPARISON:  CT abdomen pelvis 04/13/2023  FINDINGS: Liver: Heterogenous parenchymal echogenicity with nodular contour.  No focal lesion, mass or  intrahepatic biliary ductal dilatation.  Main Portal Vein size: 1.5 cm  Portal Vein Velocities  Hepatopetal flow in the portal veins.  Main Prox:  43 cm/sec  Main Mid: 32 cm/sec  Main Dist:  30 cm/sec Right: 62 cm/sec Left: 38 cm/sec  Hepatic Vein Velocities  Hepatic feeble flow in the hepatic veins.  Right:  29 cm/sec  Middle:  18 cm/sec  Left:  51 cm/sec  IVC: Present and patent with normal respiratory phasicity.  Hepatic Artery Velocity:  73 cm/sec  Splenectomy.  Portal Vein Occlusion/Thrombus: No  Splenic Vein Occlusion/Thrombus: No  Ascites: Small perihepatic ascites.  Varices: None  IMPRESSION: Heterogenous liver parenchyma with nodular contour compatible with cirrhosis. Small perihepatic ascites.  Patent portal and hepatic veins with antegrade flow. Velocities given above.  Electronically Signed   By: Rozell Cornet M.D.   On: 07/04/2023 02:12

## 2023-07-27 NOTE — Assessment & Plan Note (Addendum)
 Likely anemia chronic kidney disease with a component of iron deficiency anemia Endoscopy colonoscopy: Up-to-date Previously received IV iron infusion in 2014 SPEP done in 04/27/2023: No M spike, free light chains are slightly elevated with a normal ratio Labs significantly improved with iron replacement She has been evaluated at [facility] for kidney transplant  - Continue oral ferrous sulfate  325 mg every other day.  Take MiraLAX  for constipation - Will consider ESA if hemoglobin is less than 10 after iron supplementation  Return to clinic in 3 months with labs

## 2023-07-27 NOTE — Addendum Note (Signed)
 Addended by: Sabas Cradle A on: 07/27/2023 01:42 PM   Modules accepted: Orders

## 2023-08-17 DIAGNOSIS — L570 Actinic keratosis: Secondary | ICD-10-CM | POA: Diagnosis not present

## 2023-08-17 DIAGNOSIS — L814 Other melanin hyperpigmentation: Secondary | ICD-10-CM | POA: Diagnosis not present

## 2023-08-17 DIAGNOSIS — D1801 Hemangioma of skin and subcutaneous tissue: Secondary | ICD-10-CM | POA: Diagnosis not present

## 2023-08-17 DIAGNOSIS — L578 Other skin changes due to chronic exposure to nonionizing radiation: Secondary | ICD-10-CM | POA: Diagnosis not present

## 2023-08-18 DIAGNOSIS — N390 Urinary tract infection, site not specified: Secondary | ICD-10-CM | POA: Diagnosis not present

## 2023-08-18 DIAGNOSIS — N3941 Urge incontinence: Secondary | ICD-10-CM | POA: Diagnosis not present

## 2023-08-18 DIAGNOSIS — N200 Calculus of kidney: Secondary | ICD-10-CM | POA: Diagnosis not present

## 2023-08-24 DIAGNOSIS — I10 Essential (primary) hypertension: Secondary | ICD-10-CM | POA: Diagnosis not present

## 2023-08-24 DIAGNOSIS — E559 Vitamin D deficiency, unspecified: Secondary | ICD-10-CM | POA: Diagnosis not present

## 2023-08-24 DIAGNOSIS — Z299 Encounter for prophylactic measures, unspecified: Secondary | ICD-10-CM | POA: Diagnosis not present

## 2023-08-24 DIAGNOSIS — M797 Fibromyalgia: Secondary | ICD-10-CM | POA: Diagnosis not present

## 2023-08-24 DIAGNOSIS — R52 Pain, unspecified: Secondary | ICD-10-CM | POA: Diagnosis not present

## 2023-08-24 DIAGNOSIS — E7800 Pure hypercholesterolemia, unspecified: Secondary | ICD-10-CM | POA: Diagnosis not present

## 2023-08-24 DIAGNOSIS — I2510 Atherosclerotic heart disease of native coronary artery without angina pectoris: Secondary | ICD-10-CM | POA: Diagnosis not present

## 2023-08-28 DIAGNOSIS — N390 Urinary tract infection, site not specified: Secondary | ICD-10-CM | POA: Diagnosis not present

## 2023-08-28 DIAGNOSIS — N200 Calculus of kidney: Secondary | ICD-10-CM | POA: Diagnosis not present

## 2023-09-05 DIAGNOSIS — M25561 Pain in right knee: Secondary | ICD-10-CM | POA: Diagnosis not present

## 2023-09-11 DIAGNOSIS — I129 Hypertensive chronic kidney disease with stage 1 through stage 4 chronic kidney disease, or unspecified chronic kidney disease: Secondary | ICD-10-CM | POA: Diagnosis not present

## 2023-09-11 DIAGNOSIS — E1122 Type 2 diabetes mellitus with diabetic chronic kidney disease: Secondary | ICD-10-CM | POA: Diagnosis not present

## 2023-09-11 DIAGNOSIS — I25119 Atherosclerotic heart disease of native coronary artery with unspecified angina pectoris: Secondary | ICD-10-CM | POA: Diagnosis not present

## 2023-09-11 DIAGNOSIS — Z79899 Other long term (current) drug therapy: Secondary | ICD-10-CM | POA: Diagnosis not present

## 2023-09-11 DIAGNOSIS — N184 Chronic kidney disease, stage 4 (severe): Secondary | ICD-10-CM | POA: Diagnosis not present

## 2023-09-11 DIAGNOSIS — N2581 Secondary hyperparathyroidism of renal origin: Secondary | ICD-10-CM | POA: Diagnosis not present

## 2023-09-11 DIAGNOSIS — E782 Mixed hyperlipidemia: Secondary | ICD-10-CM | POA: Diagnosis not present

## 2023-09-12 ENCOUNTER — Ambulatory Visit: Payer: Self-pay | Admitting: Cardiology

## 2023-09-12 DIAGNOSIS — M25561 Pain in right knee: Secondary | ICD-10-CM | POA: Diagnosis not present

## 2023-09-12 LAB — HEPATIC FUNCTION PANEL
ALT: 41 IU/L — ABNORMAL HIGH (ref 0–32)
AST: 30 IU/L (ref 0–40)
Albumin: 3.8 g/dL — ABNORMAL LOW (ref 3.9–4.9)
Alkaline Phosphatase: 145 IU/L — ABNORMAL HIGH (ref 44–121)
Bilirubin Total: 0.3 mg/dL (ref 0.0–1.2)
Bilirubin, Direct: 0.13 mg/dL (ref 0.00–0.40)
Total Protein: 6.6 g/dL (ref 6.0–8.5)

## 2023-09-12 LAB — LIPID PANEL
Chol/HDL Ratio: 2.9 ratio (ref 0.0–4.4)
Cholesterol, Total: 117 mg/dL (ref 100–199)
HDL: 40 mg/dL (ref >39)
LDL Chol Calc (NIH): 57 mg/dL (ref 0–99)
Triglycerides: 105 mg/dL (ref 0–149)
VLDL Cholesterol Cal: 20 mg/dL (ref 5–40)

## 2023-09-13 DIAGNOSIS — D849 Immunodeficiency, unspecified: Secondary | ICD-10-CM | POA: Diagnosis not present

## 2023-09-13 DIAGNOSIS — Z944 Liver transplant status: Secondary | ICD-10-CM | POA: Diagnosis not present

## 2023-09-13 DIAGNOSIS — Z9482 Intestine transplant status: Secondary | ICD-10-CM | POA: Diagnosis not present

## 2023-09-13 DIAGNOSIS — Z7682 Awaiting organ transplant status: Secondary | ICD-10-CM | POA: Diagnosis not present

## 2023-09-15 DIAGNOSIS — Z124 Encounter for screening for malignant neoplasm of cervix: Secondary | ICD-10-CM | POA: Diagnosis not present

## 2023-09-15 DIAGNOSIS — R87612 Low grade squamous intraepithelial lesion on cytologic smear of cervix (LGSIL): Secondary | ICD-10-CM | POA: Diagnosis not present

## 2023-09-15 DIAGNOSIS — R55 Syncope and collapse: Secondary | ICD-10-CM | POA: Diagnosis not present

## 2023-09-18 ENCOUNTER — Encounter: Payer: Self-pay | Admitting: Cardiology

## 2023-09-18 DIAGNOSIS — D631 Anemia in chronic kidney disease: Secondary | ICD-10-CM | POA: Diagnosis not present

## 2023-09-18 DIAGNOSIS — E1122 Type 2 diabetes mellitus with diabetic chronic kidney disease: Secondary | ICD-10-CM | POA: Diagnosis not present

## 2023-09-18 DIAGNOSIS — N2581 Secondary hyperparathyroidism of renal origin: Secondary | ICD-10-CM | POA: Diagnosis not present

## 2023-09-18 DIAGNOSIS — N184 Chronic kidney disease, stage 4 (severe): Secondary | ICD-10-CM | POA: Diagnosis not present

## 2023-09-20 ENCOUNTER — Telehealth: Payer: Self-pay | Admitting: Cardiology

## 2023-09-20 NOTE — Telephone Encounter (Signed)
 Called pt to schedule nurse visit and she asked if she needs to start taking her BP medicine or continue with not taking them. She has been passing out due to her BP being low.

## 2023-09-20 NOTE — Telephone Encounter (Signed)
 Per Dr.McDowell Thank you for the update. I agree with holding both amlodipine and atenolol  for now. Continue to track blood pressure and heart rate, let's go ahead and schedule a nurse vital sign check in one week. The blood pressure at nephrology visit looked better off medication.   Patient informed and verbalized understanding of plan.

## 2023-09-27 ENCOUNTER — Ambulatory Visit: Attending: Cardiology | Admitting: *Deleted

## 2023-09-27 VITALS — BP 144/100 | HR 95

## 2023-09-27 DIAGNOSIS — I9589 Other hypotension: Secondary | ICD-10-CM

## 2023-09-27 NOTE — Progress Notes (Unsigned)
 Patient in office for nurse BP check.  BP 144/100  95   After sitting 5-10 min's - 134/98   States her BP runs from the low 80/s up to the mid 140's / 50's -90's HR running 60's - 140's  States that she has been off of the Amlodipine & Atenolol  since 09/15/23.   She also c/o rapid heartbeat when she gets up - occurs off / on.  To the point that she will have to sit down.    States she is in the process of being worked up for right kidney transplant at [facility] - has appointment this Friday.  Will be doing Echo this Friday.  Carpul Stress test on 11/29/23 & cardiac PET on 12/13/23.    Next appointment with Dr. Debera is 10/06/23.

## 2023-09-28 ENCOUNTER — Encounter: Payer: Self-pay | Admitting: *Deleted

## 2023-09-28 DIAGNOSIS — Z9483 Pancreas transplant status: Secondary | ICD-10-CM | POA: Diagnosis not present

## 2023-09-28 DIAGNOSIS — Z944 Liver transplant status: Secondary | ICD-10-CM | POA: Diagnosis not present

## 2023-09-28 DIAGNOSIS — Z299 Encounter for prophylactic measures, unspecified: Secondary | ICD-10-CM | POA: Diagnosis not present

## 2023-09-28 DIAGNOSIS — I10 Essential (primary) hypertension: Secondary | ICD-10-CM | POA: Diagnosis not present

## 2023-09-28 DIAGNOSIS — Z1331 Encounter for screening for depression: Secondary | ICD-10-CM | POA: Diagnosis not present

## 2023-09-28 DIAGNOSIS — Z1339 Encounter for screening examination for other mental health and behavioral disorders: Secondary | ICD-10-CM | POA: Diagnosis not present

## 2023-09-28 DIAGNOSIS — Z6837 Body mass index (BMI) 37.0-37.9, adult: Secondary | ICD-10-CM | POA: Diagnosis not present

## 2023-09-28 DIAGNOSIS — Z7189 Other specified counseling: Secondary | ICD-10-CM | POA: Diagnosis not present

## 2023-09-28 DIAGNOSIS — Z0000 Encounter for general adult medical examination without abnormal findings: Secondary | ICD-10-CM | POA: Diagnosis not present

## 2023-09-28 NOTE — Progress Notes (Signed)
 Patient notified via mychart

## 2023-09-29 DIAGNOSIS — N183 Chronic kidney disease, stage 3 unspecified: Secondary | ICD-10-CM | POA: Diagnosis not present

## 2023-09-29 DIAGNOSIS — Z7682 Awaiting organ transplant status: Secondary | ICD-10-CM | POA: Diagnosis not present

## 2023-09-29 DIAGNOSIS — I1310 Hypertensive heart and chronic kidney disease without heart failure, with stage 1 through stage 4 chronic kidney disease, or unspecified chronic kidney disease: Secondary | ICD-10-CM | POA: Diagnosis not present

## 2023-09-29 DIAGNOSIS — E1122 Type 2 diabetes mellitus with diabetic chronic kidney disease: Secondary | ICD-10-CM | POA: Diagnosis not present

## 2023-09-29 DIAGNOSIS — G8929 Other chronic pain: Secondary | ICD-10-CM | POA: Diagnosis not present

## 2023-09-29 DIAGNOSIS — I951 Orthostatic hypotension: Secondary | ICD-10-CM | POA: Diagnosis not present

## 2023-09-29 DIAGNOSIS — D849 Immunodeficiency, unspecified: Secondary | ICD-10-CM | POA: Diagnosis not present

## 2023-09-29 DIAGNOSIS — Z9482 Intestine transplant status: Secondary | ICD-10-CM | POA: Diagnosis not present

## 2023-09-29 DIAGNOSIS — Z01810 Encounter for preprocedural cardiovascular examination: Secondary | ICD-10-CM | POA: Diagnosis not present

## 2023-10-04 DIAGNOSIS — N893 Dysplasia of vagina, unspecified: Secondary | ICD-10-CM | POA: Diagnosis not present

## 2023-10-06 ENCOUNTER — Encounter: Payer: Self-pay | Admitting: Cardiology

## 2023-10-06 ENCOUNTER — Ambulatory Visit: Attending: Cardiology | Admitting: Cardiology

## 2023-10-06 VITALS — BP 128/90 | HR 88 | Ht 64.0 in | Wt 217.2 lb

## 2023-10-06 DIAGNOSIS — E782 Mixed hyperlipidemia: Secondary | ICD-10-CM | POA: Diagnosis not present

## 2023-10-06 DIAGNOSIS — N184 Chronic kidney disease, stage 4 (severe): Secondary | ICD-10-CM | POA: Diagnosis not present

## 2023-10-06 DIAGNOSIS — I25119 Atherosclerotic heart disease of native coronary artery with unspecified angina pectoris: Secondary | ICD-10-CM

## 2023-10-06 NOTE — Progress Notes (Signed)
 Cardiology Office Note  Date: 10/06/2023   ID: TERESITA FANTON, DOB 01-06-1961, MRN [id number]  History of Present Illness: [name] is a 63 y.o. female last seen in February.  She is here for a routine visit.  She does not report any angina at this time, no interval nitroglycerin  use.  She continues to follow closely at [facility], is being considered for renal transplantation given progressive renal dysfunction, most recent GFR down to 15.  She has had interval problems with symptomatic hypotension and syncope.  She had been on atenolol  12.5 mg daily, was started on Norvasc by nephrology in the setting of hypertension and it was on this combination of medications that she had difficulty.  Subsequently, has stopped all antihypertensives and is following blood pressure at home.  She tells me that she is getting consistent numbers in the 130s over 80s.  I reviewed her lab work, LDL 57 on combination of Zetia  and Pravachol .  She had a recent echocardiogram at [facility] showing normal LVEF and no major valvular abnormalities.  She will also undergo ischemic testing in the next few months as part of her renal transplantation workup.  Physical Exam: VS:  BP (!) 128/90   Pulse 88   Ht 5' 4 (1.626 m)   Wt 217 lb 3.2 oz (98.5 kg)   SpO2 97%   BMI 37.28 kg/m , BMI Body mass index is 37.28 kg/m.  Wt Readings from Last 3 Encounters:  10/06/23 217 lb 3.2 oz (98.5 kg)  06/16/23 216 lb 0.8 oz (98 kg)  05/24/23 211 lb (95.7 kg)    General: Patient appears comfortable at rest. HEENT: Conjunctiva and lids normal. Neck: Supple, no elevated JVP or carotid bruits. Lungs: Clear to auscultation, nonlabored breathing at rest. Cardiac: Regular rate and rhythm, no S3 or significant systolic murmur, no pericardial rub. Extremities: No pitting edema.  ECG:  An ECG dated 12/09/2022 was personally reviewed today and demonstrated:  Sinus arrhythmia with lead artifact.  Labwork: 07/20/2023: BUN 37; Creatinine, Ser  2.70; Hemoglobin 11.0; Platelets 320; Potassium 4.6; Sodium 140 09/11/2023: ALT 41; AST 30     Component Value Date/Time   CHOL 117 09/11/2023 1120   TRIG 105 09/11/2023 1120   HDL 40 09/11/2023 1120   CHOLHDL 2.9 09/11/2023 1120   LDLCALC 57 09/11/2023 1120  August 2025: Potassium 5.3, BUN 46, creatinine 3.3, GFR 15.  Other Studies Reviewed Today:  Echocardiogram 09/29/2023 ([facility]): CONCLUSION -------------------------------------------------------------------------------  NORMAL LEFT VENTRICULAR SYSTOLIC FUNCTION WITH MODERATE LVH  ESTIMATED EF: >55%, CALC EF(3D): 57%  NORMAL LA PRESSURES WITH NORMAL DIASTOLIC FUNCTION  NORMAL RIGHT VENTRICULAR SYSTOLIC FUNCTION  NO VALVULAR REGURGITATION  NO VALVULAR STENOSIS   Assessment and Plan:  1.  CAD status post CTO PCI with DES to the LAD in September 2022.  LVEF 55 to 60% by recent follow-up echocardiogram at [facility].  She reports no active angina or interval nitroglycerin  use.  Continue aspirin  81 mg daily and Plavix  75 mg daily.  She is also on Pravachol  and Zetia .  Further ischemic workup planned at [facility] in the setting of renal transplant evaluation.   2.  Mixed hyperlipidemia, recent LDL 57.  Plan to continue Pravachol  40 mg daily and Zetia  10 mg daily.   3.  CKD stage IV-V, recent lab work at [facility] showed creatinine up to 3.3 with GFR 15..  She is in the process of being considered for renal transplantation at [facility].   4.  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  She continues to follow regularly on immunosuppressive treatment.  5.  Primary hypertension.  Continue to track blood pressure at home for now, do not plan on being too aggressive in light of her history of symptomatic hypotension and syncope.  If upward trend continues on a consistent basis, could always consider starting Norvasc 2.5 mg daily alone.  Disposition:  Follow up 6 months.  Signed, Jayson JUDITHANN Sierras, M.D.,  F.A.C.C. [facility]

## 2023-10-06 NOTE — Patient Instructions (Addendum)

## 2023-10-23 DIAGNOSIS — D849 Immunodeficiency, unspecified: Secondary | ICD-10-CM | POA: Diagnosis not present

## 2023-10-23 DIAGNOSIS — Z9482 Intestine transplant status: Secondary | ICD-10-CM | POA: Diagnosis not present

## 2023-10-28 NOTE — Progress Notes (Unsigned)
 [facility] [address], [place] [address]   CLINIC:  Medical Oncology/Hematology  PCP:  Bertell Satterfield, MD [address] [place] [address] [phone]   REASON FOR VISIT:  Follow-up for anemia related to CKD and iron deficiency  CURRENT THERAPY: Oral iron + intermittent IV iron infusions  INTERVAL HISTORY:   [name] 63 y.o. female returns for routine follow-up of anemia related to CKD and iron deficiency. She was last seen by Dr. Davonna on 07/27/2023.  At today's visit, she reports feeling fairly well.   No recent hospitalizations, surgeries, or changes in baseline health status.  She has some ongoing fatigue.  This previously improved after IV iron, but has recurred for the past month or so. She thinks this may be related to her BP meds being too high, which was causing low BP and syncopal episodes.  She is doing better after being taken off of her BP meds. No ice pica. She has headaches. No chest pain or hard time breathing. She denies any rectal bleeding or melena. She is taking iron every other day.  She has 60% energy and 100% appetite. She endorses that she is maintaining a stable weight.  ASSESSMENT & PLAN:  1.  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  Free light chains slightly elevated, with normal ratio. - She has history of Roux-en-Y gastrojejunostomy. - She has a history of GI bleeding secondary to gastric ulcer (2017) - EGD (07/30/2015): Grade 1 varices - Colonoscopy (at [facility] 10/26/2023): Patent end-to-side colocolonic anastomosis with healthy-appearing mucosa, diverticulosis, polyp x 1 in sigmoid colon - Received IV Feraheme  x 2 in May 2025  - She is taking ferrous sulfate  325 mg every other day.   - She denies any overt rectal bleeding or melena - Reports  fatigue - Most recent labs (via [facility] 10/23/2023 and 10/27/2023): Hgb 11.1/MCV 98.  Normal WBC and platelets. Creatinine 2.52/GFR 21 (baseline CKD stage IV) Ferritin 203, iron saturation 33% Vitamin B12 850, normal folate 8.5 - Hemoglobin significantly improved after iron repletion.  Residual anemia secondary to CKD stage IV - PLAN:  Repeat labs with RTC in 6 months.   -- Continue iron every other day - Consider ESA if significant anemia (Hgb <10.0) despite adequate iron stores  2.  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 Negative for appetite change, chills, diaphoresis, fever and unexpected weight change.  HENT:   Negative for lump/mass and nosebleeds.   Eyes:  Negative for eye problems.  Respiratory:  Negative for cough, hemoptysis and shortness of breath.   Cardiovascular:  Negative for chest pain, leg swelling and palpitations.  Gastrointestinal:  Negative for abdominal pain, blood in stool, constipation, diarrhea, nausea and vomiting.  Genitourinary:  Negative  for hematuria.   Skin: Negative.   Neurological:  Positive for dizziness. Negative for headaches and light-headedness.  Hematological:  Does not bruise/bleed easily.  Psychiatric/Behavioral:  Positive for sleep disturbance.      PHYSICAL EXAM:  ECOG PERFORMANCE STATUS: 1 - Symptomatic but completely ambulatory  Vitals:   10/30/23 1457 10/30/23 1501  BP: (!) 158/77 (!) 167/87  Pulse:  67   Resp: 18   Temp: (!) 97.4 F (36.3 C)   SpO2: 100%    Filed Weights   10/30/23 1457  Weight: 220 lb 3.8 oz (99.9 kg)   Physical Exam Constitutional:      Appearance: Normal appearance. She is obese.  Cardiovascular:     Heart sounds: Normal heart sounds.  Pulmonary:     Breath sounds: Normal breath sounds.  Neurological:     General: No focal deficit present.     Mental Status: Mental status is at baseline.  Psychiatric:        Behavior: Behavior normal. Behavior is cooperative.     PAST MEDICAL/SURGICAL HISTORY:  Past Medical History:  Diagnosis Date   Anemia    Anxiety    Ascites    Cancer (HCC)    numerous skin cancer   CKD (chronic kidney disease) stage 4, GFR 15-29 ml/min (HCC)    Coronary artery disease    Depression    Enteropathy 07/31/2015   Portal hypertensive enteropathy per capsule study, with stigmata of bleeding.   Esophageal varices (HCC)    Fibromyalgia    Gastric ulcer 07/15/2015   EGD; no stigmata of bleeding   GI bleed 07/17/2015   Gram-negative bacteremia 12/08/2011   Headache(784.0)    Heart murmur    History of cirrhosis    History of hepatitis C - successfuly treated medically 07/14/2015   History of kidney stones    Myocardial infarction [facility])    Nephrolithiasis    Portal vein thrombosis    Retroperitoneal hemorrhage complicating cardiac catheterization, small 11/06/2020   Right ureteral stone 12/07/2011   S/P angioplasty CTO with stent  (DES) 11/04/20 of mid LAD 11/06/2020   Sleep apnea    Status post liver transplant (HCC[facility] 08/2016   [facility]   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   [facility]   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   [facility]   Superior mesenteric vein thrombosis 07/14/2015   Type 2 diabetes mellitus (HCC)    former, prior to transplant 2018   Past Surgical History:  Procedure Laterality Date   ABDOMINAL HYSTERECTOMY  2003   CESAREAN SECTION  1995   CHOLECYSTECTOMY  1987   COLONOSCOPY      COLONOSCOPY  10/21/2011   Procedure: COLONOSCOPY;  Surgeon: Claudis RAYMOND Rivet, MD;  [facility];  Service: Endoscopy;  Laterality: N/A;  245    CORONARY CTO INTERVENTION N/A 11/04/2020   Procedure: CORONARY CTO INTERVENTION;  Surgeon: Swaziland, Peter M, MD;  Location: [facility] INVASIVE CV LAB;  Service: Cardiovascular;  Laterality: N/A;   CYSTOSCOPY W/ RETROGRADES  12/08/2011   Procedure: CYSTOSCOPY WITH RETROGRADE PYELOGRAM;  Surgeon: Emery LILLETTE Blaze, MD;  Location: [facility] ORS;  Service: Urology;  Laterality: Right;   CYSTOSCOPY WITH RETROGRADE PYELOGRAM, URETEROSCOPY AND STENT PLACEMENT Right 09/16/2022   Procedure: CYSTOSCOPY WITH RETROGRADE PYELOGRAM AND RIGHT STENT PLACEMENT;  Surgeon: Cam Morene ORN, MD;  Location: [facility];  Service: Urology;  Laterality: Right;   CYSTOSCOPY WITH RETROGRADE PYELOGRAM, URETEROSCOPY AND STENT PLACEMENT Right 10/12/2022   Procedure: CYSTOSCOPY WITH RIGHT  RETROGRADE, URETEROSCOPY, BASKETING OF STONE  AND STENT EXCHANGE;  Surgeon: Alvaro Ricardo KATHEE Mickey., MD;  Location: [facility];  Service: Urology;  Laterality: Right;   CYSTOSCOPY WITH STENT PLACEMENT  12/08/2011   Procedure: CYSTOSCOPY WITH STENT PLACEMENT;  Surgeon: Emery LILLETTE Blaze, MD;  Location: [facility];  Service: Urology;  Laterality: Right;   CYSTOSCOPY/URETEROSCOPY/HOLMIUM LASER/STENT PLACEMENT Bilateral 09/28/2022   Procedure: FIRST STAGGE CYSTOSCOPY BILATERAL RETROGRADES RIGHT URETEROSCOPY/HOLMIUM LASER/STENT EXCHANGE;  Surgeon: Alvaro Ricardo KATHEE Mickey., MD;  Location: [facility];  Service: Urology;  Laterality: Bilateral;  75 MINS FOR CASE   DILATION AND CURETTAGE OF UTERUS     ESOPHAGOGASTRODUODENOSCOPY N/A 02/19/2014   Procedure: ESOPHAGOGASTRODUODENOSCOPY (EGD);  Surgeon: Claudis RAYMOND Rivet, MD;  [facility];  Service: Endoscopy;  Laterality: N/A;  100   ESOPHAGOGASTRODUODENOSCOPY N/A 07/15/2015   Procedure: ESOPHAGOGASTRODUODENOSCOPY (EGD);  Surgeon: Claudis RAYMOND Rivet, MD;  [facility];  Service:  Endoscopy;  Laterality: N/A;   ESOPHAGOGASTRODUODENOSCOPY N/A 07/29/2015   Procedure: ESOPHAGOGASTRODUODENOSCOPY (EGD);  Surgeon: Claudis RAYMOND Rivet, MD;  [facility];  Service: Endoscopy;  Laterality: N/A;   GASTRIC BYPASS  ~ 1995   HERNIA REPAIR  04/14/11   one in my bellybutton (11/07/2012)   INGUINAL HERNIA REPAIR Right    maybe 2 (11/07/2012)   KNEE ARTHROSCOPY Right    LEFT HEART CATH AND CORONARY ANGIOGRAPHY N/A 09/25/2020   Procedure: LEFT HEART CATH AND CORONARY ANGIOGRAPHY;  Surgeon: Wonda Sharper, MD;  Location: [facility] INVASIVE CV LAB;  Service: Cardiovascular;  Laterality: N/A;   LITHOTRIPSY     several times (11/07/2012)   OPEN REDUCTION INTERNAL FIXATION (ORIF) DISTAL RADIAL FRACTURE Right 11/07/2012   Procedure: OPEN REDUCTION INTERNAL FIXATION (ORIF) RIGHT DISTAL RADIAL FRACTURE;  Surgeon: Prentice LELON Pagan, MD;  Location: [facility] OR;  Service: Orthopedics;  Laterality: Right;   ORIF DISTAL RADIUS FRACTURE Right 11/07/2012   OTHER SURGICAL HISTORY  08/14/2016   5 organ transplant- liver, stomach, pancreas, small and large intestine   UPPER GASTROINTESTINAL ENDOSCOPY      SOCIAL HISTORY:  Social History   Socioeconomic History   Marital status: Married    Spouse name: Herschel   Number of children: 1   Years of education: 12   Highest education level: Associate degree: occupational, Scientist, product/process development, or vocational program  Occupational History   Not on file  Tobacco Use   Smoking status: Former    Current packs/day: 0.00    Average packs/day: 0.5 packs/day for 5.0 years (2.5 ttl pk-yrs)    Types: Cigarettes    Start date: 05/02/1986    Quit date: 05/02/1991    Years since quitting: 32.5   Smokeless tobacco: Never  Vaping Use   Vaping status: Never Used  Substance and Sexual Activity   Alcohol use: No    Alcohol/week: 0.0 standard drinks of alcohol   Drug use: No   Sexual activity: Not Currently  Other Topics Concern   Not on file  Social History Narrative   Lives  with husband and son   L handed   Caffeine: 8 soda cans a week    Social Drivers of Corporate investment banker Strain: Low Risk  (09/15/2023)   Received [facility]   Overall Financial Resource Strain (CARDIA)    Difficulty of Paying Living Expenses: Not hard at all  Food Insecurity: No Food Insecurity (09/15/2023)   Received from [facility]   Hunger Vital Sign    Within the  past 12 months, you worried that your food would run out before you got the money to buy more.: Never true    Within the past 12 months, the food you bought just didn't last and you didn't have money to get more.: Never true  Transportation Needs: No Transportation Needs (09/15/2023)   Received from [facility] - Transportation    In the past 12 months, has lack of transportation kept you from medical appointments or from getting medications?: No    Lack of Transportation (Non-Medical): No  Physical Activity: Not on file  Stress: Not on file  Social Connections: Not on file  Intimate Partner Violence: Not At Risk (06/16/2023)   Humiliation, Afraid, Rape, and Kick questionnaire    Fear of Current or Ex-Partner: No    Emotionally Abused: No    Physically Abused: No    Sexually Abused: No    FAMILY HISTORY:  Family History  Problem Relation Age of Onset   Dementia Mother    Heart disease Father    Liver disease Father    Hypertension Father    Diabetes Father    Dementia Father    Hypertension Brother     CURRENT MEDICATIONS:  Outpatient Encounter Medications as of 10/30/2023  Medication Sig   aspirin  EC 81 MG tablet Take 1 tablet (81 mg total) by mouth daily. Swallow whole.   buPROPion (WELLBUTRIN SR) 150 MG 12 hr tablet Take 150 mg by mouth daily.   calcitRIOL  (ROCALTROL ) 0.25 MCG capsule Take 0.25 mcg by mouth daily.   clopidogrel  (PLAVIX ) 75 MG tablet take 1 tablet (75 milligram total) by mouth daily.   cycloSPORINE  (SANDIMMUNE ) 100  MG capsule Take 100 mg by mouth daily.   cycloSPORINE  (SANDIMMUNE ) 25 MG capsule Take 75 mg by mouth at bedtime.   cycloSPORINE  modified (NEORAL ) 100 MG capsule Take 100 mg by mouth.   cycloSPORINE  modified (NEORAL ) 25 MG capsule Take 75 mg by mouth.   ezetimibe  (ZETIA ) 10 MG tablet take 1 tablet once daily.   fluorouracil (EFUDEX) 5 % cream Apply topically.   nitroGLYCERIN  (NITROSTAT ) 0.4 MG SL tablet Place 1 tablet (0.4 mg total) under the tongue every 5 (five) minutes as needed. (Patient taking differently: Place 0.4 mg under the tongue every 5 (five) minutes as needed for chest pain.)   Oxycodone  HCl 10 MG TABS Take 1 tablet (10 mg total) by mouth every 6 (six) hours as needed (post-op pain).   pantoprazole  (PROTONIX ) 40 MG tablet Take 40 mg by mouth at bedtime.   phentermine (ADIPEX-P) 37.5 MG tablet Take 37.5 mg by mouth daily.   pravastatin  (PRAVACHOL ) 40 MG tablet Take 1 tablet (40 mg total) by mouth every evening.   solifenacin (VESICARE) 5 MG tablet Take 5 mg by mouth daily.   traZODone  (DESYREL ) 100 MG tablet Take 100 mg by mouth at bedtime.   TRINTELLIX 10 MG TABS tablet Take 10 mg by mouth daily.   No facility-administered encounter medications on file as of 10/30/2023.    ALLERGIES:  Allergies  Allergen Reactions   Ancef [Cefazolin Sodium] Other (See Comments)    Reaction:  Vaginal and mouth blisters   Cefazolin Rash and Dermatitis    Blisters in mouth and vagina  Other Reaction(s): Not available  cefazolin   Silicone Dermatitis   Tapentadol     Pt unsure of allergy  Other Reaction(s): Not available  tapentadol   Gadobenate Itching, Nausea And Vomiting, Rash and Dermatitis  gadobenate dimeglumine    Iodinated Contrast Media Itching, Nausea And Vomiting, Rash and Other (See Comments)    Iodinated contrast media (substance)   Tape Dermatitis and Rash    Adhesive on Surgical Tape, blisters skin     LABORATORY DATA:  I have reviewed the labs as listed.  CBC     Component Value Date/Time   WBC 7.4 07/20/2023 1107   RBC 3.73 (L) 07/20/2023 1107   HGB 11.0 (L) 07/20/2023 1107   HGB 10.9 (L) 09/24/2020 1346   HCT 36.4 07/20/2023 1107   HCT 35.4 09/24/2020 1346   PLT 320 07/20/2023 1107   PLT 294 09/24/2020 1346   MCV 97.6 07/20/2023 1107   MCV 94 09/24/2020 1346   MCH 29.5 07/20/2023 1107   MCHC 30.2 07/20/2023 1107   RDW 15.4 07/20/2023 1107   RDW 12.2 09/24/2020 1346   LYMPHSABS 1.4 07/20/2023 1107   LYMPHSABS 1.9 09/24/2020 1346   MONOABS 0.5 07/20/2023 1107   EOSABS 0.5 07/20/2023 1107   EOSABS 0.4 09/24/2020 1346   BASOSABS 0.1 07/20/2023 1107   BASOSABS 0.1 09/24/2020 1346      Latest Ref Rng & Units 09/11/2023   11:20 AM 07/20/2023   11:07 AM 12/09/2022   10:29 AM  CMP  Glucose 70 - 99 mg/dL  887  896   BUN 8 - 23 mg/dL  37  23   Creatinine 9.55 - 1.00 mg/dL  7.29  7.27   Sodium 864 - 145 mmol/L  140  139   Potassium 3.5 - 5.1 mmol/L  4.6  3.7   Chloride 98 - 111 mmol/L  110  109   CO2 22 - 32 mmol/L  21  19   Calcium  8.9 - 10.3 mg/dL  8.6  8.6   Total Protein 6.0 - 8.5 g/dL 6.6  7.3  6.9   Total Bilirubin 0.0 - 1.2 mg/dL 0.3  0.6  0.2   Alkaline Phos 44 - 121 IU/L 145  154  98   AST 0 - 40 IU/L 30  15  39   ALT 0 - 32 IU/L 41  22  40     DIAGNOSTIC IMAGING:  I have independently reviewed the relevant imaging and discussed with the patient.   WRAP UP:  All questions were answered. The patient knows to call the clinic with any problems, questions or concerns.  Medical decision making: Moderate  Time spent on visit: I spent 20 minutes counseling the patient face to face. The total time spent in the appointment was 30 minutes and more than 50% was on counseling.  Pleasant CHRISTELLA Barefoot, PA-C  10/30/23 10:38 PM

## 2023-10-30 ENCOUNTER — Inpatient Hospital Stay: Attending: Oncology | Admitting: Physician Assistant

## 2023-10-30 VITALS — BP 167/87 | HR 67 | Temp 97.4°F | Resp 18 | Wt 220.2 lb

## 2023-10-30 DIAGNOSIS — Z9884 Bariatric surgery status: Secondary | ICD-10-CM | POA: Diagnosis not present

## 2023-10-30 DIAGNOSIS — N184 Chronic kidney disease, stage 4 (severe): Secondary | ICD-10-CM | POA: Insufficient documentation

## 2023-10-30 DIAGNOSIS — D509 Iron deficiency anemia, unspecified: Secondary | ICD-10-CM

## 2023-10-30 DIAGNOSIS — D631 Anemia in chronic kidney disease: Secondary | ICD-10-CM | POA: Insufficient documentation

## 2023-10-30 DIAGNOSIS — Z7982 Long term (current) use of aspirin: Secondary | ICD-10-CM | POA: Diagnosis not present

## 2023-10-30 DIAGNOSIS — Z7902 Long term (current) use of antithrombotics/antiplatelets: Secondary | ICD-10-CM | POA: Insufficient documentation

## 2023-10-30 DIAGNOSIS — Z79621 Long term (current) use of calcineurin inhibitor: Secondary | ICD-10-CM | POA: Diagnosis not present

## 2023-10-30 DIAGNOSIS — Z79899 Other long term (current) drug therapy: Secondary | ICD-10-CM | POA: Insufficient documentation

## 2023-10-30 DIAGNOSIS — E611 Iron deficiency: Secondary | ICD-10-CM | POA: Diagnosis not present

## 2023-10-30 NOTE — Patient Instructions (Signed)
 [facility] at [facility] **VISIT SUMMARY & IMPORTANT INSTRUCTIONS **   You were seen today by Pleasant Barefoot PA-C for your anemia.   Your anemia is related to iron deficiency as well as chronic kidney disease. Your blood levels look much better after you received IV iron and started taking iron pills. You do not need any IV iron at this time, but should continue taking your iron pill (ferrous sulfate  325 mg every other day) In the future, if you have significant anemia (hemoglobin <10) despite normal iron levels, we may consider starting you on Retacrit injections to help improve your blood count.    FOLLOW-UP APPOINTMENT: 6 months  ** Thank you for trusting me with your healthcare!  I strive to provide all of my patients with quality care at each visit.  If you receive a survey for this visit, I would be so grateful to you for taking the time to provide feedback.  Thank you in advance!  ~ Alanson Hausmann                                        Dr. Mickiel Davonna Pleasant Barefoot, PA-C          Delon Hope, NP   - - - - - - - - - - - - - - - - - -    Thank you for choosing [facility] at [facility] to provide your oncology and hematology care.  To afford each patient quality time with our provider, please arrive at least 15 minutes before your scheduled appointment time.   If you have a lab appointment with the Cancer Center please come in thru the Main Entrance and check in at the main information desk.  You need to re-schedule your appointment should you arrive 10 or more minutes late.  We strive to give you quality time with our providers, and arriving late affects you and other patients whose appointments are after yours.  Also, if you no show three or more times for appointments you may be dismissed from the clinic at the providers discretion.     Again, thank you for choosing [facility]  Our hope is that these  requests will decrease the amount of time that you wait before being seen by our physicians.       _____________________________________________________________  Should you have questions after your visit to [facility], please contact our office at [phone] and follow the prompts.  Our office hours are 8:00 a.m. and 4:30 p.m. Monday - Friday.  Please note that voicemails left after 4:00 p.m. may not be returned until the following business day.  We are closed weekends and major holidays.  You do have access to a nurse 24-7, just call the main number to the clinic [phone] and do not press any options, hold on the line and a nurse will answer the phone.    For prescription refill requests, have your pharmacy contact our office and allow 72 hours.

## 2023-12-25 NOTE — Progress Notes (Signed)
 [facility] Follow Up Visit   Patient Name: [name], female   Patient DOB: 28-Jan-1961 Date of Service: 12/25/2023  Patient MRN: [id number] Provider Creating Note: Woodward Brought, MD  [phone] Primary Care Physician: Bertell Jerilynn PARAS, MD   [address] [place] [address] Additional Physicians/ Providers:   Impression/Recommendations   Ms. Sopheap Basic is a 63 y.o. female with diabetes mellitus type II, liver, small bowel and pancreas transplant in 08/16/2026 who presents for chronic kidney disease stage IV. Creatinine 2.36, GFR of 23. PCR 690mg .  KFRE: 22.56% in 2 years and 56.53% in 5 years.   Chronic Kidney Disease stage IV: with  proteinuria, hyperkalemia and hematuria: with hypertension, diabetes and calcineurin use.  - avoid nonsteroidal anti-inflammatory agents - discussed dialysis. Patient most likely is not a candidate for peritoneal dialysis.  - Currently off of her ACE-I/ARB due to advanced renal disease and hyperkalemia.  - not currently on an SGLT-2 inhibitor - not a candidate with pancreatic transplant  - not currently on a mineralocorticoid receptor antagonist due to advanced renal disease and hyperkalemia - pending [facility] transplant for kidney transplant evaluation   Hypertension with chronic kidney disease: elevated. 160/104 - currently holding atenolol  and amlodipine due to recent syncope episodes.  - restart amlodipine 5mg  daily.  - patient to notify cardiology about her syncopal events. She may need further work up.  - home blood pressure monitoring.   Anemia with chronic kidney disease: hemoglobin 11.2 - at goal.  - Follows with [facility], Dr. Davonna.  Diabetes mellitus type II with chronic kidney disease: currently well controlled status post pancreas transplant. Currently not on any hypoglycemic agents.   Secondary Hyperparathyroidism: PTH 95. - Continue calcitriol . Decrease to three times a week.   Liver, small bowel  and Pancreas transplant:  - current immunosuppresion with cyclosporine .   Patient Active Problem List  Diagnosis   Acute nontraumatic kidney injury (HCC)   Anemia in chronic kidney disease   Alcoholic cirrhosis of liver with ascites (HCC)   Ascites   Awaiting transplantation   Chronic viral hepatitis C (HCC)   Cirrhosis of liver (HCC)   Clostridium difficile colitis   Complication of intestine transplant (HCC)   Diabetes mellitus (HCC)   Esophageal varices due to cirrhosis of liver (HCC)   Hypertensive chronic kidney disease, benign, with chronic kidney disease stage I through stage IV, or unspecified   Gastroesophageal reflux disease   History of gastrointestinal tract bypass   Hypokalemia   Hypomagnesemia   Hyponatremia   Hypotension   Immunosuppression (HCC)   Chronic kidney disease, Stage IV (severe) (HCC)   Superior mesenteric vein thrombosis (HCC)   Type 2 diabetes mellitus with diabetic chronic kidney disease (HCC)   Ureteric stone   Urinary tract infection   Vitamin D  deficiency   Proteinuria   Hematuria   Secondary hyperparathyroidism of renal origin (HCC)   Hyperkalemia    No orders of the defined types were placed in this encounter.      No follow-ups on file.  Chief Complaint   Chief Complaint  Patient presents with   Follow-up    History of Present Illness   Ms. Shawntelle Ungar presents for follow up. Patient presents by himself.   Patient has a new PCP, Dr. Rosamond.  Patient is undergoing work up for renal transplant at [facility].   Patient was taken off amlodipine and atenolol  on last visit due to syncope. She now has elevated blood pressures.  Patient denies any other changes to her medications.   Denies use of nonsteroidal anti-inflammatory agents.   Patient denies any lower extremity swelling.   Reports no recent hospitalizations.     Medications   Current Outpatient Medications:    aspirin  (ST JOSEPH) 81 MG  EC tablet, Take 81 mg by mouth, Disp: , Rfl:    calcitriol  (Rocaltrol ) 0.25 MCG capsule, Take 1 capsule (0.25 mcg total) by mouth 1 (one) time each day, Disp: 30 capsule, Rfl: 11   clopidogrel  (PLAVIX ) 75 MG tablet, take 1 tablet (75 MILLIGRAM total) by mouth daily., Disp: , Rfl:    cycloSPORINE  (SandIMMUNE ) 100 MG capsule, Take 100 mg by mouth 1 (one) time each day, Disp: , Rfl:    cycloSPORINE  (SandIMMUNE ) 25 MG capsule, Take 75 mg by mouth at bed time 3 AT NIGHT, Disp: , Rfl:    ezetimibe  (ZETIA ) 10 MG tablet, Take 1 tablet by mouth 1 (one) time each day, Disp: , Rfl:    oxyCODONE  (OxyCONTIN ) 10 MG 12 hr abuse-deterrent tablet, Take 10 mg by mouth every 12 (twelve) hours if needed, Disp: , Rfl:    pantoprazole  (PROTONIX ) 40 MG EC tablet, Take 40 mg by mouth 1 (one) time each day before breakfast, Disp: , Rfl:    pravastatin  (PRAVACHOL ) 40 MG tablet, Take 40 mg by mouth in the morning., Disp: , Rfl:    Allergies Adhesive tape, Cefazolin, Gadobenate, Iodinated contrast media, and Tapentadol  History Past Medical History:  Diagnosis Date   Anemia    Anemia in chronic kidney disease 04/19/2017   Last Assessment & Plan:   Formatting of this note might be different from the original.  Anemia noted on admission  - S/p 2 units PRBCs with appropriate bump in H&H  - Iron studies c/w iron deficiency, supplementation added 3/13     Calculus of kidney    Chronic kidney disease    Chronic kidney disease, Stage IV (severe) (HCC) 11/08/2016   Last Assessment & Plan:   Formatting of this note might be different from the original.  Noted development of CKD s/p txp  - Baseline Cr ~1.5  - Elevated sCr on admission to 3.0 - likely 2/2 supratherapeutic FK level 10/18  - Cr remains stable, 2.1 on day of discharge     Chronic viral hepatitis C (HCC)    Diabetes mellitus without mention of complication, type II or unspecified type, not stated as uncontrolled (HCC)    Hematuria 04/17/2023    Hyperkalemia 06/05/2023   Hypertensive chronic kidney disease, benign, with chronic kidney disease stage I through stage IV, or unspecified 07/05/2017   Formatting of this note might be different from the original.  A.03/2017: Echo normal LVEF, late positive bubble study, Cardiolite 04/04/16 with no evidence of ischemia and a post stress LVEF 70% and normal ECG.     Nephrolithiasis    Proteinuria 04/17/2023   Secondary hyperparathyroidism of renal origin (HCC) 06/05/2023   Status post liver transplant (HCC)    Status post pancreas transplant (HCC)    Type 2 diabetes mellitus with diabetic chronic kidney disease (HCC) 06/10/2014   Formatting of this note might be different from the original.  Diagnosed in 2005 and on oral agent/diet control for 4 years and subsequently on insulin  with fair control.  Patient reports last HgA1C was 7.1 (last one visible in record at time of evaluation was 7.9 from 2017).       Last Assessment & Plan:   Formatting of this  note might be different from the original.  Blood glucose wnl on CMP  -     Past Surgical History:  Procedure Laterality Date   ABDOMINAL SURGERY     HYSTERECTOMY     LIVER TRANSPLANTATION     PANCREAS TRANSPLANT     SMALL INTESTINE TRANSPLANT     Family History  Problem Relation Age of Onset   Dementia Mother    Hypertension Mother    Gout Father    Dementia Father    Hypertension Father    Heart disease Father    Diabetes Father    Social History   Tobacco Use   Smoking status: Never   Smokeless tobacco: Never  Substance Use Topics   Alcohol  use: Never     Physical Exam  Vitals BP (!) 168/104 (BP Location: Left upper arm, Patient Position: Standing)   Pulse 77   Temp 97.9 F   Wt 223 lb (101 kg)   SpO2 95%   BMI 37.11 kg/m   Vitals reviewed. Constitutional: She is oriented to person, place, and time. She appears well-developed.  HEENT:  Head: Normocephalic and atraumatic. Mouth/Throat: Oropharynx  is clear and moist.  Eyes: Pupils are equal, round, and reactive to light.  Neck: Neck supple.  Cardiovascular:  Normal rate and regular rhythm.           Pulmonary/Chest: Effort normal and breath sounds normal.  Abdominal: Soft.  Neurological: She is alert and oriented to person, place, and time.  Skin: Skin is warm and dry.     Laboratory Studies  Chemistry  Lab Units 12/07/23 1201 09/29/23 1257 09/11/23 1121 07/21/23 0816 07/04/23 0741 05/31/23 1121 04/26/23 1155  SODIUM mmol/L 140  --  141  --  142 142 142  POTASSIUM mmol/L 5.7*  --  5.3*  --  4.7 5.3* 4.9  CHLORIDE mmol/L 106  --  106  --  108* 112* 109*  CO2 mmol/L 21  --  20  --  18* 17* 19*  MAGNESIUM  mg/dL  --  2.0  --   --   --   --  1.9  CALCIUM  mg/dL 8.8  --  9.3  --  8.7 8.5* 8.5*  PHOSPHORUS mg/dL 3.6  --  3.4  --  5.0* 4.1 4.4*  PTH pg/mL 95*  --  88*  --  152* 213* 168*  GLUCOSE mg/dL 85  --  898*  --  82 77 49*  ALBUMIN  g/dL 3.8*  --  3.8* 3.3* 4.0 3.9 3.1  3.8*  BUN mg/dL 40*  --  38*  --  30* 45* 38*  CREATININE mg/dL 7.63*  --  7.52*  --  7.42* 2.73* 2.52*    Iron Studies  Lab Units 08/18/22 1212  IRON g/dL 54  FERRITIN ng/mL 18  TIBC g/dL 677  IRON SATURATION % 17    CBC  Lab Units 12/07/23 1201 09/11/23 1121 07/04/23 0741 05/31/23 1121 04/26/23 1155  WBC AUTO x10E3/uL 8.2 8.6 8.0 7.5 8.1  HEMOGLOBIN g/dL 88.7 88.6 9.9* 9.3* 9.4*  HEMATOCRIT % 35.2 37.0 33.0* 30.4* 31.0*  MCV fL 95 96 94 91 93  PLATELETS AUTO x10E3/uL 311 353 399 327 383    Urine  Lab Units 12/07/23 1201 09/11/23 1121 07/04/23 0741 05/31/23 1121 04/26/23 1155 04/17/23 1328  COLOR U  Yellow Yellow Yellow Yellow  --   --   COLOR UA   --   --   --   --   --  Yellow  CLARITY UA   --   --   --   --   --  Cloudy  KETONES UA   --   --   --   --   --  Negative  PH UA   --   --   --   --   --  5.5  UROBILINOGEN UA mg/dL 0.2 0.2 0.2 0.2  --  0.2  PROTEIN UR  1+* 2+* 1+* 1+*  --   --   PROT/CREAT RATIO UR mg/g creat  690* 726* 776* 603* 867*  --         No lab exists for component: CYCLOSPORITR     Woodward Brought, MD  [address], [place]

## 2024-01-22 NOTE — Progress Notes (Signed)
 [facility] Follow Up Visit   Patient Name: [name], female   Patient DOB: Oct 28, 1960 Date of Service: 01/22/2024  Patient MRN: [id number] Provider Creating Note: Woodward Brought, MD  [phone] Primary Care Physician: Rosamond Leta NOVAK, MD   [address] [place] [address] Additional Physicians/ Providers:   Impression/Recommendations   [name] is a 63 y.o. female with diabetes mellitus type II, liver, small bowel and pancreas transplant in 08/16/2026 who presents for chronic kidney disease stage IV. Creatinine 3.05, GFR pf 17. PCR 531mg .  KFRE: 31.42% in 2 years and 70.73% in 5 years.   Chronic Kidney Disease stage IV: with  proteinuria, hyperkalemia and hematuria: with hypertension, diabetes and calcineurin use.  - avoid nonsteroidal anti-inflammatory agents - discussed dialysis. Patient most likely is not a candidate for peritoneal dialysis.  - Currently off of her ACE-I/ARB due to advanced renal disease and hyperkalemia.  - not currently on an SGLT-2 inhibitor - not a candidate with pancreatic transplant  - not currently on a mineralocorticoid receptor antagonist due to advanced renal disease and hyperkalemia - [facility] transplant for kidney transplant evaluation : currently on hold.   Hypertension with chronic kidney disease: elevated. 150/76. However home readings are at goal.  - continue amlodipine 5mg  daily.  - home blood pressure monitoring.   Anemia with chronic kidney disease: hemoglobin at goal.  - Follows with [facility], Dr. Davonna.  Diabetes mellitus type II with chronic kidney disease: currently well controlled status post pancreas transplant. Currently not on any hypoglycemic agents.   Secondary Hyperparathyroidism: PTH 95. - Continue calcitriol . Decrease to three times a week.   Liver, small bowel and Pancreas transplant:  - current immunosuppresion with cyclosporine .   Patient Active Problem List  Diagnosis   Acute  nontraumatic kidney injury (HCC)   Anemia in chronic kidney disease   Alcoholic cirrhosis of liver with ascites (HCC)   Ascites   Awaiting transplantation   Chronic viral hepatitis C (HCC)   Cirrhosis of liver (HCC)   Clostridium difficile colitis   Complication of intestine transplant (HCC)   Diabetes mellitus (HCC)   Esophageal varices due to cirrhosis of liver (HCC)   Hypertensive chronic kidney disease, benign, with chronic kidney disease stage I through stage IV, or unspecified   Gastroesophageal reflux disease   History of gastrointestinal tract bypass   Hypokalemia   Hypomagnesemia   Hyponatremia   Hypotension   Immunosuppression (HCC)   Chronic kidney disease, Stage IV (severe) (HCC)   Superior mesenteric vein thrombosis (HCC)   Type 2 diabetes mellitus with diabetic chronic kidney disease (HCC)   Ureteric stone   Urinary tract infection   Vitamin D  deficiency   Proteinuria   Hematuria   Secondary hyperparathyroidism of renal origin (HCC)   Hyperkalemia    Orders Placed This Encounter   PTH, Intact   Renal Function Panel   CBC and Differential   Urinalysis, Complete w/reflex to Culture   Protein, Total, Random Urine w/Creatinine (Protein/Creat Ratio)       Return in about 6 weeks (around 03/04/2024).  Chief Complaint   Chief Complaint  Patient presents with   Follow-up    History of Present Illness   [name] presents for follow up. Patient presents by herself. Patient states that she is doing well and has no complaints. Denies any more syncopal episodes. Patient states that she is taking all her medications as prescribed. Patient denies any changes to her medications.  Patient states she is tolerating all her medications. She states her blood pressure is well controlled. Patient denies any lower extremity swelling.   Patient's 71 plus year old mother has been hospitalized for acute cholecystitis. Patient is stressed  and spending a great deal of time this week at the hospital tending her her mother.   Patient denies any hospitalizations.   Patient is keeping a low potassium diet.   Denies use of nonsteroidal anti-inflammatory agents.   Medications   Current Outpatient Medications:    buPROPion SR (WELLBUTRIN SR) 150 MG 12 hr tablet, Take 150 mg by mouth 1 (one) time each day, Disp: , Rfl:    amLODIPine (NORVASC) 5 MG tablet, Take 1 tablet (5 mg total) by mouth 1 (one) time each day, Disp: 30 tablet, Rfl: 11   aspirin  (ST JOSEPH) 81 MG EC tablet, Take 81 mg by mouth, Disp: , Rfl:    calcitriol  (Rocaltrol ) 0.25 MCG capsule, Take 1 capsule (0.25 mcg total) by mouth 3 times weekly: Mon Wed and Friday morning, Disp: 12 capsule, Rfl: 11   clopidogrel  (PLAVIX ) 75 MG tablet, take 1 tablet (75 MILLIGRAM total) by mouth daily., Disp: , Rfl:    cycloSPORINE  (SandIMMUNE ) 100 MG capsule, Take 100 mg by mouth 1 (one) time each day, Disp: , Rfl:    cycloSPORINE  (SandIMMUNE ) 25 MG capsule, Take 75 mg by mouth at bed time 3 AT NIGHT, Disp: , Rfl:    ezetimibe  (ZETIA ) 10 MG tablet, Take 1 tablet by mouth 1 (one) time each day, Disp: , Rfl:    oxyCODONE  (OxyCONTIN ) 10 MG 12 hr abuse-deterrent tablet, Take 10 mg by mouth every 12 (twelve) hours if needed, Disp: , Rfl:    pantoprazole  (PROTONIX ) 40 MG EC tablet, Take 40 mg by mouth 1 (one) time each day before breakfast, Disp: , Rfl:    pravastatin  (PRAVACHOL ) 40 MG tablet, Take 40 mg by mouth in the morning., Disp: , Rfl:    Allergies Adhesive tape, Cefazolin, Gadobenate, Iodinated contrast media, and Tapentadol  History Past Medical History:  Diagnosis Date   Anemia    Anemia in chronic kidney disease 04/19/2017   Last Assessment & Plan:   Formatting of this note might be different from the original.  Anemia noted on admission  - S/p 2 units PRBCs with appropriate bump in H&H  - Iron studies c/w iron deficiency, supplementation added 3/13     Calculus  of kidney    Chronic kidney disease    Chronic kidney disease, Stage IV (severe) (HCC) 11/08/2016   Last Assessment & Plan:   Formatting of this note might be different from the original.  Noted development of CKD s/p txp  - Baseline Cr ~1.5  - Elevated sCr on admission to 3.0 - likely 2/2 supratherapeutic FK level 10/18  - Cr remains stable, 2.1 on day of discharge     Chronic viral hepatitis C (HCC)    Diabetes mellitus without mention of complication, type II or unspecified type, not stated as uncontrolled (HCC)    Hematuria 04/17/2023   Hyperkalemia 06/05/2023   Hypertensive chronic kidney disease, benign, with chronic kidney disease stage I through stage IV, or unspecified 07/05/2017   Formatting of this note might be different from the original.  A.03/2017: Echo normal LVEF, late positive bubble study, Cardiolite 04/04/16 with no evidence of ischemia and a post stress LVEF 70% and normal ECG.     Nephrolithiasis    Proteinuria 04/17/2023   Secondary hyperparathyroidism of  renal origin (HCC) 06/05/2023   Status post liver transplant [facility])    Status post pancreas transplant (HCC)    Type 2 diabetes mellitus with diabetic chronic kidney disease (HCC) 06/10/2014   Formatting of this note might be different from the original.  Diagnosed in 2005 and on oral agent/diet control for 4 years and subsequently on insulin  with fair control.  Patient reports last HgA1C was 7.1 (last one visible in record at time of evaluation was 7.9 from 2017).       Last Assessment & Plan:   Formatting of this note might be different from the original.  Blood glucose wnl on CMP  -     Past Surgical History:  Procedure Laterality Date   ABDOMINAL SURGERY     HYSTERECTOMY     LIVER TRANSPLANTATION     PANCREAS TRANSPLANT     SMALL INTESTINE TRANSPLANT     Family History  Problem Relation Age of Onset   Dementia Mother    Hypertension Mother    Gout Father    Dementia Father     Hypertension Father    Heart disease Father    Diabetes Father    Social History   Tobacco Use   Smoking status: Never   Smokeless tobacco: Never  Substance Use Topics   Alcohol  use: Never     Physical Exam  Vitals BP 150/76 (BP Location: Right upper arm, Patient Position: Sitting)   Pulse 52   Temp 98.2 F   Wt 228 lb 6.4 oz (104 kg)   SpO2 94%   BMI 38.01 kg/m   Vitals reviewed. Constitutional: She is oriented to person, place, and time. She appears well-developed.  HEENT:  Head: Normocephalic and atraumatic. Mouth/Throat: Oropharynx is clear and moist.  Eyes: Pupils are equal, round, and reactive to light.  Neck: Neck supple.  Cardiovascular:  Normal rate and regular rhythm.           Pulmonary/Chest: Effort normal and breath sounds normal.  Abdominal: Soft.  Neurological: She is alert and oriented to person, place, and time.  Skin: Skin is warm and dry.     Laboratory Studies  Chemistry  Lab Units 01/18/24 0945 12/07/23 1201 09/29/23 1257 09/11/23 1121 07/21/23 0816 07/04/23 0741 05/31/23 1121 04/26/23 1155  SODIUM mmol/L 141 140  --  141  --  142 142 142  POTASSIUM mmol/L 5.2 5.7*  --  5.3*  --  4.7 5.3* 4.9  CHLORIDE mmol/L 107* 106  --  106  --  108* 112* 109*  CO2 mmol/L 20 21  --  20  --  18* 17* 19*  MAGNESIUM  mg/dL  --   --  2.0  --   --   --   --  1.9  CALCIUM  mg/dL 9.0 8.8  --  9.3  --  8.7 8.5* 8.5*  PHOSPHORUS mg/dL 3.8 3.6  --  3.4  --  5.0* 4.1 4.4*  PTH pg/mL  --  95*  --  88*  --  152* 213* 168*  GLUCOSE mg/dL 88 85  --  898*  --  82 77 49*  ALBUMIN  g/dL 3.9 3.8*  --  3.8* 3.3* 4.0 3.9 3.1  3.8*  BUN mg/dL 36* 40*  --  38*  --  30* 45* 38*  CREATININE mg/dL 6.94* 7.63*  --  7.52*  --  2.57* 2.73* 2.52*    Iron Studies  Lab Units 08/18/22 1212  IRON g/dL 54  FERRITIN ng/mL 18  TIBC g/dL 677  IRON SATURATION % 17    CBC  Lab Units 12/07/23 1201 09/11/23 1121 07/04/23 0741 05/31/23 1121 04/26/23 1155  WBC AUTO  x10E3/uL 8.2 8.6 8.0 7.5 8.1  HEMOGLOBIN g/dL 88.7 88.6 9.9* 9.3* 9.4*  HEMATOCRIT % 35.2 37.0 33.0* 30.4* 31.0*  MCV fL 95 96 94 91 93  PLATELETS AUTO x10E3/uL 311 353 399 327 383    Urine  Lab Units 01/18/24 0945 12/07/23 1201 09/11/23 1121 07/04/23 0741 05/31/23 1121 04/26/23 1155 04/17/23 1328  COLOR U  Yellow Yellow Yellow Yellow Yellow  --   --   COLOR UA   --   --   --   --   --   --  Yellow  CLARITY UA   --   --   --   --   --   --  Cloudy  KETONES UA   --   --   --   --   --   --  Negative  PH UA   --   --   --   --   --   --  5.5  UROBILINOGEN UA mg/dL 0.2 0.2 0.2 0.2 0.2  --  0.2  PROTEIN UR  1+* 1+* 2+* 1+* 1+*  --   --   PROT/CREAT RATIO UR mg/g creat 531* 690* 726* 776* 603* 867*  --         No lab exists for component: CYCLOSPORITR     Woodward Brought, MD  [employer], [place]

## 2024-01-24 NOTE — Telephone Encounter (Signed)
 Dr. Bertis reviewed transplant labs, no changes in medications.  Will repeat when seen in  clinic on 02/19

## 2024-01-24 NOTE — Telephone Encounter (Signed)
 Called patient about her most recent lab results. Patient did not answer the phone, however I did leave her a message to give me a call back when she could. I will call again at another time.

## 2024-05-06 ENCOUNTER — Ambulatory Visit: Admitting: Physician Assistant
